# Patient Record
Sex: Male | Born: 1950
Health system: Southern US, Community
[De-identification: ages and names within clinical notes are randomized; demographics above are authoritative.]

## PROBLEM LIST (undated history)

## (undated) DIAGNOSIS — M1990 Unspecified osteoarthritis, unspecified site: Secondary | ICD-10-CM

## (undated) DIAGNOSIS — K219 Gastro-esophageal reflux disease without esophagitis: Secondary | ICD-10-CM

## (undated) DIAGNOSIS — I440 Atrioventricular block, first degree: Secondary | ICD-10-CM

## (undated) DIAGNOSIS — D649 Anemia, unspecified: Secondary | ICD-10-CM

## (undated) DIAGNOSIS — Z8614 Personal history of Methicillin resistant Staphylococcus aureus infection: Secondary | ICD-10-CM

## (undated) DIAGNOSIS — I2510 Atherosclerotic heart disease of native coronary artery without angina pectoris: Secondary | ICD-10-CM

## (undated) DIAGNOSIS — G4730 Sleep apnea, unspecified: Secondary | ICD-10-CM

## (undated) DIAGNOSIS — E785 Hyperlipidemia, unspecified: Secondary | ICD-10-CM

## (undated) DIAGNOSIS — R011 Cardiac murmur, unspecified: Secondary | ICD-10-CM

## (undated) DIAGNOSIS — E039 Hypothyroidism, unspecified: Secondary | ICD-10-CM

## (undated) DIAGNOSIS — I10 Essential (primary) hypertension: Secondary | ICD-10-CM

## (undated) DIAGNOSIS — I453 Trifascicular block: Secondary | ICD-10-CM

## (undated) DIAGNOSIS — I219 Acute myocardial infarction, unspecified: Secondary | ICD-10-CM

## (undated) DIAGNOSIS — M6740 Ganglion, unspecified site: Secondary | ICD-10-CM

## (undated) DIAGNOSIS — Z98811 Dental restoration status: Secondary | ICD-10-CM

## (undated) HISTORY — DX: Essential (primary) hypertension: I10

## (undated) HISTORY — PX: PROSTATECTOMY: SHX69

## (undated) HISTORY — PX: TRANSMETATARSAL AMPUTATION: SHX6197

## (undated) HISTORY — DX: Sleep apnea, unspecified: G47.30

## (undated) HISTORY — DX: Acute myocardial infarction, unspecified: I21.9

## (undated) HISTORY — PX: CORONARY ANGIOPLASTY WITH STENT PLACEMENT: SHX49

## (undated) HISTORY — DX: Cardiac murmur, unspecified: R01.1

## (undated) HISTORY — DX: Hyperlipidemia, unspecified: E78.5

## (undated) HISTORY — PX: CATARACT EXTRACTION: SUR2

---

## 1999-06-16 ENCOUNTER — Ambulatory Visit: Admission: RE | Admit: 1999-06-16 | Discharge: 1999-06-16 | Payer: Self-pay | Admitting: Otolaryngology

## 2001-07-08 ENCOUNTER — Encounter: Admission: RE | Admit: 2001-07-08 | Discharge: 2001-10-06 | Payer: Self-pay | Admitting: Internal Medicine

## 2001-09-30 ENCOUNTER — Ambulatory Visit (HOSPITAL_COMMUNITY): Admission: RE | Admit: 2001-09-30 | Discharge: 2001-09-30 | Payer: Self-pay | Admitting: Gastroenterology

## 2002-05-23 ENCOUNTER — Encounter: Payer: Self-pay | Admitting: Internal Medicine

## 2002-05-23 ENCOUNTER — Encounter: Admission: RE | Admit: 2002-05-23 | Discharge: 2002-05-23 | Payer: Self-pay | Admitting: Internal Medicine

## 2004-01-24 DIAGNOSIS — I2510 Atherosclerotic heart disease of native coronary artery without angina pectoris: Secondary | ICD-10-CM

## 2004-01-24 HISTORY — DX: Atherosclerotic heart disease of native coronary artery without angina pectoris: I25.10

## 2004-03-21 ENCOUNTER — Encounter (INDEPENDENT_AMBULATORY_CARE_PROVIDER_SITE_OTHER): Payer: Self-pay | Admitting: *Deleted

## 2004-03-21 ENCOUNTER — Ambulatory Visit (HOSPITAL_COMMUNITY): Admission: RE | Admit: 2004-03-21 | Discharge: 2004-03-21 | Payer: Self-pay | Admitting: Gastroenterology

## 2004-05-27 ENCOUNTER — Ambulatory Visit: Payer: Self-pay | Admitting: Internal Medicine

## 2004-06-23 DIAGNOSIS — I219 Acute myocardial infarction, unspecified: Secondary | ICD-10-CM

## 2004-06-23 HISTORY — DX: Acute myocardial infarction, unspecified: I21.9

## 2004-07-11 ENCOUNTER — Inpatient Hospital Stay (HOSPITAL_COMMUNITY): Admission: EM | Admit: 2004-07-11 | Discharge: 2004-07-13 | Payer: Self-pay | Admitting: Emergency Medicine

## 2004-08-03 ENCOUNTER — Ambulatory Visit (HOSPITAL_COMMUNITY): Admission: RE | Admit: 2004-08-03 | Discharge: 2004-08-04 | Payer: Self-pay | Admitting: Cardiology

## 2004-08-09 ENCOUNTER — Ambulatory Visit: Payer: Self-pay | Admitting: Cardiology

## 2004-08-25 ENCOUNTER — Encounter (HOSPITAL_COMMUNITY): Admission: RE | Admit: 2004-08-25 | Discharge: 2004-11-23 | Payer: Self-pay | Admitting: Cardiology

## 2004-10-13 ENCOUNTER — Ambulatory Visit: Payer: Self-pay | Admitting: Cardiology

## 2004-11-24 ENCOUNTER — Encounter (HOSPITAL_COMMUNITY): Admission: RE | Admit: 2004-11-24 | Discharge: 2005-01-20 | Payer: Self-pay | Admitting: Cardiology

## 2005-01-04 ENCOUNTER — Ambulatory Visit: Payer: Self-pay | Admitting: Cardiology

## 2005-04-05 ENCOUNTER — Ambulatory Visit: Payer: Self-pay | Admitting: Cardiology

## 2005-07-06 ENCOUNTER — Ambulatory Visit: Payer: Self-pay | Admitting: Cardiology

## 2006-02-19 ENCOUNTER — Inpatient Hospital Stay (HOSPITAL_COMMUNITY): Admission: EM | Admit: 2006-02-19 | Discharge: 2006-02-26 | Payer: Self-pay | Admitting: Emergency Medicine

## 2006-02-21 ENCOUNTER — Ambulatory Visit: Payer: Self-pay | Admitting: Infectious Diseases

## 2006-02-23 ENCOUNTER — Encounter (INDEPENDENT_AMBULATORY_CARE_PROVIDER_SITE_OTHER): Payer: Self-pay | Admitting: Specialist

## 2006-02-23 DIAGNOSIS — Z8614 Personal history of Methicillin resistant Staphylococcus aureus infection: Secondary | ICD-10-CM

## 2006-02-23 HISTORY — DX: Personal history of Methicillin resistant Staphylococcus aureus infection: Z86.14

## 2006-02-23 HISTORY — PX: TOE AMPUTATION: SHX809

## 2006-03-28 ENCOUNTER — Ambulatory Visit: Payer: Self-pay | Admitting: Internal Medicine

## 2006-04-08 ENCOUNTER — Encounter: Admission: RE | Admit: 2006-04-08 | Discharge: 2006-04-08 | Payer: Self-pay | Admitting: Orthopaedic Surgery

## 2007-01-07 ENCOUNTER — Telehealth: Payer: Self-pay | Admitting: Internal Medicine

## 2007-01-14 ENCOUNTER — Telehealth (INDEPENDENT_AMBULATORY_CARE_PROVIDER_SITE_OTHER): Payer: Self-pay | Admitting: *Deleted

## 2007-01-22 ENCOUNTER — Encounter: Payer: Self-pay | Admitting: Internal Medicine

## 2007-01-22 DIAGNOSIS — I252 Old myocardial infarction: Secondary | ICD-10-CM | POA: Insufficient documentation

## 2007-01-22 DIAGNOSIS — G4733 Obstructive sleep apnea (adult) (pediatric): Secondary | ICD-10-CM | POA: Insufficient documentation

## 2007-01-22 DIAGNOSIS — E782 Mixed hyperlipidemia: Secondary | ICD-10-CM | POA: Insufficient documentation

## 2007-01-22 DIAGNOSIS — I10 Essential (primary) hypertension: Secondary | ICD-10-CM | POA: Insufficient documentation

## 2007-01-22 DIAGNOSIS — E669 Obesity, unspecified: Secondary | ICD-10-CM | POA: Insufficient documentation

## 2007-01-22 DIAGNOSIS — K219 Gastro-esophageal reflux disease without esophagitis: Secondary | ICD-10-CM | POA: Insufficient documentation

## 2007-01-22 DIAGNOSIS — Z862 Personal history of diseases of the blood and blood-forming organs and certain disorders involving the immune mechanism: Secondary | ICD-10-CM | POA: Insufficient documentation

## 2007-01-22 DIAGNOSIS — E039 Hypothyroidism, unspecified: Secondary | ICD-10-CM | POA: Insufficient documentation

## 2007-01-22 DIAGNOSIS — E1159 Type 2 diabetes mellitus with other circulatory complications: Secondary | ICD-10-CM | POA: Insufficient documentation

## 2007-01-22 DIAGNOSIS — M1990 Unspecified osteoarthritis, unspecified site: Secondary | ICD-10-CM | POA: Insufficient documentation

## 2007-02-05 ENCOUNTER — Telehealth: Payer: Self-pay | Admitting: Internal Medicine

## 2007-03-12 ENCOUNTER — Telehealth (INDEPENDENT_AMBULATORY_CARE_PROVIDER_SITE_OTHER): Payer: Self-pay | Admitting: *Deleted

## 2007-03-14 ENCOUNTER — Telehealth (INDEPENDENT_AMBULATORY_CARE_PROVIDER_SITE_OTHER): Payer: Self-pay | Admitting: *Deleted

## 2007-04-08 ENCOUNTER — Telehealth (INDEPENDENT_AMBULATORY_CARE_PROVIDER_SITE_OTHER): Payer: Self-pay | Admitting: *Deleted

## 2007-05-06 ENCOUNTER — Telehealth (INDEPENDENT_AMBULATORY_CARE_PROVIDER_SITE_OTHER): Payer: Self-pay | Admitting: *Deleted

## 2007-06-10 ENCOUNTER — Telehealth (INDEPENDENT_AMBULATORY_CARE_PROVIDER_SITE_OTHER): Payer: Self-pay | Admitting: *Deleted

## 2007-07-09 ENCOUNTER — Telehealth (INDEPENDENT_AMBULATORY_CARE_PROVIDER_SITE_OTHER): Payer: Self-pay | Admitting: *Deleted

## 2007-08-12 ENCOUNTER — Telehealth: Payer: Self-pay | Admitting: Internal Medicine

## 2007-09-09 ENCOUNTER — Telehealth: Payer: Self-pay | Admitting: Internal Medicine

## 2007-10-07 ENCOUNTER — Telehealth (INDEPENDENT_AMBULATORY_CARE_PROVIDER_SITE_OTHER): Payer: Self-pay | Admitting: *Deleted

## 2007-11-05 ENCOUNTER — Telehealth (INDEPENDENT_AMBULATORY_CARE_PROVIDER_SITE_OTHER): Payer: Self-pay | Admitting: *Deleted

## 2007-12-10 ENCOUNTER — Telehealth: Payer: Self-pay | Admitting: Internal Medicine

## 2008-01-13 ENCOUNTER — Telehealth: Payer: Self-pay | Admitting: Internal Medicine

## 2008-02-10 ENCOUNTER — Telehealth: Payer: Self-pay | Admitting: Internal Medicine

## 2008-03-12 ENCOUNTER — Telehealth (INDEPENDENT_AMBULATORY_CARE_PROVIDER_SITE_OTHER): Payer: Self-pay | Admitting: *Deleted

## 2008-03-16 ENCOUNTER — Ambulatory Visit: Payer: Self-pay | Admitting: Internal Medicine

## 2008-03-16 DIAGNOSIS — E785 Hyperlipidemia, unspecified: Secondary | ICD-10-CM | POA: Insufficient documentation

## 2008-03-16 DIAGNOSIS — G4730 Sleep apnea, unspecified: Secondary | ICD-10-CM | POA: Insufficient documentation

## 2008-03-21 ENCOUNTER — Encounter: Admission: RE | Admit: 2008-03-21 | Discharge: 2008-03-21 | Payer: Self-pay | Admitting: Orthopaedic Surgery

## 2008-05-11 ENCOUNTER — Telehealth (INDEPENDENT_AMBULATORY_CARE_PROVIDER_SITE_OTHER): Payer: Self-pay | Admitting: *Deleted

## 2008-07-13 ENCOUNTER — Telehealth (INDEPENDENT_AMBULATORY_CARE_PROVIDER_SITE_OTHER): Payer: Self-pay | Admitting: *Deleted

## 2008-09-07 ENCOUNTER — Telehealth: Payer: Self-pay | Admitting: Internal Medicine

## 2008-11-09 ENCOUNTER — Telehealth (INDEPENDENT_AMBULATORY_CARE_PROVIDER_SITE_OTHER): Payer: Self-pay | Admitting: *Deleted

## 2008-11-23 ENCOUNTER — Encounter: Payer: Self-pay | Admitting: Internal Medicine

## 2008-11-23 ENCOUNTER — Telehealth (INDEPENDENT_AMBULATORY_CARE_PROVIDER_SITE_OTHER): Payer: Self-pay | Admitting: *Deleted

## 2009-01-11 ENCOUNTER — Telehealth: Payer: Self-pay | Admitting: Internal Medicine

## 2009-03-08 ENCOUNTER — Telehealth: Payer: Self-pay | Admitting: Internal Medicine

## 2009-05-03 ENCOUNTER — Telehealth (INDEPENDENT_AMBULATORY_CARE_PROVIDER_SITE_OTHER): Payer: Self-pay | Admitting: *Deleted

## 2009-07-05 ENCOUNTER — Telehealth (INDEPENDENT_AMBULATORY_CARE_PROVIDER_SITE_OTHER): Payer: Self-pay | Admitting: *Deleted

## 2009-07-06 ENCOUNTER — Telehealth (INDEPENDENT_AMBULATORY_CARE_PROVIDER_SITE_OTHER): Payer: Self-pay | Admitting: *Deleted

## 2009-07-27 ENCOUNTER — Ambulatory Visit: Payer: Self-pay | Admitting: Internal Medicine

## 2009-07-27 DIAGNOSIS — G4710 Hypersomnia, unspecified: Secondary | ICD-10-CM | POA: Insufficient documentation

## 2009-10-26 ENCOUNTER — Telehealth (INDEPENDENT_AMBULATORY_CARE_PROVIDER_SITE_OTHER): Payer: Self-pay | Admitting: *Deleted

## 2010-01-06 ENCOUNTER — Telehealth (INDEPENDENT_AMBULATORY_CARE_PROVIDER_SITE_OTHER): Payer: Self-pay | Admitting: *Deleted

## 2010-02-13 ENCOUNTER — Encounter: Payer: Self-pay | Admitting: Orthopaedic Surgery

## 2010-02-24 NOTE — Progress Notes (Signed)
Summary: ritalin  Phone Note Call from Patient   Caller: Patient Call For: YOUNG Summary of Call: pt wants rx for ritalin. he will pick up. [phone] Initial call taken by: Tivis Ringer, CNA,  January 06, 2010 11:44 AM  Follow-up for Phone Call        Pt is aware that RX is at front for pick up.[name] CMA  January 06, 2010 5:08 PM     Prescriptions: RITALIN SR 20 MG CR-TABS (METHYLPHENIDATE HCL) take 1-2 by mouth daily as directed  #60 x 0   Entered by:   Vernie Murders   Authorized by:   Waymon Budge MD   Signed by:   Vernie Murders on 01/06/2010   Method used:   Print then Give to Patient   RxID:   [id number]

## 2010-02-24 NOTE — Progress Notes (Signed)
Summary: prescript  Phone Note Call from Patient Call back at [phone]   Caller: Patient Call For: [name] Summary of Call: pharmacy states methylphenidate is on back order . is there somethingelse he can try Initial call taken by: Rickard Patience,  July 06, 2009 1:39 PM  Follow-up for Phone Call        Please advise substitute, thanks! [name]  July 06, 2009 1:43 PM   Additional Follow-up for Phone Call Additional follow up Details #1::        Per CDY-ok to give generic Adderall sme qty and directions. RX updated in med list and printed for CDY to sign.    RX has been signed. LMTCB and see if he would like to pick up or mail to home address.[name] CMA  July 06, 2009 3:12 PM     Additional Follow-up for Phone Call Additional follow up Details #2::    patient returned katie's call.  informed pt that CDY okay'd for him to have generic adderall and that a script is ready for him to pick up at his convenience.  pt verbalized his understanding.  rx left up front in the brown filer. Boone Master CNA/MA  July 06, 2009 3:26 PM   New/Updated Medications: AMPHETAMINE-DEXTROAMPHETAMINE 20 MG XR24H-CAP (AMPHETAMINE-DEXTROAMPHETAMINE) 1-2 daily as directed Prescriptions: AMPHETAMINE-DEXTROAMPHETAMINE 20 MG XR24H-CAP (AMPHETAMINE-DEXTROAMPHETAMINE) 1-2 daily as directed  #60 x 0   Entered by:   [name] CMA   Authorized by:   Waymon Budge MD   Signed by:   [name] CMA on 07/06/2009   Method used:   Print then Give to Patient   RxID:   [id number]

## 2010-02-24 NOTE — Progress Notes (Signed)
Summary: PRESCRIPT for Ritalin  Phone Note Call from Patient Call back at [phone]   Caller: Patient Call For: YOUNG Summary of Call: PT CALLING FOR RITALIN PRESCRIPT . HE WILL PICK UP WHEN READY. Initial call taken by: Rickard Patience,  October 26, 2009 9:22 AM  Follow-up for Phone Call        Pt last seen 7-11; was told to call for RX refills; Rx printed for CDY to sign and will call patient when RX is ready for pick up.[name] CMA  October 26, 2009 10:24 AM    Rx is at front for pick up .[name] CMA  October 27, 2009 8:48 AM   Additional Follow-up for Phone Call Additional follow up Details #1::        called and spoke with pt.  pt aware rx at front desk to pick up.  Aundra Millet Reynolds LPN  October 27, 2009 8:58 AM     Prescriptions: RITALIN SR 20 MG CR-TABS (METHYLPHENIDATE HCL) take 1-2 by mouth daily as directed  #60 x 0   Entered by:   [name] CMA   Authorized by:   Waymon Budge MD   Signed by:   [name] CMA on 10/26/2009   Method used:   Print then Give to Patient   RxID:   [id number]

## 2010-02-24 NOTE — Progress Notes (Signed)
Summary: rx  Phone Note Call from Patient Call back at [phone]   Caller: Patient Call For: Kaiya Boatman Summary of Call: need rx for ritalin - pt will pick up Initial call taken by: Eugene Gavia,  March 08, 2009 10:07 AM  Follow-up for Phone Call        rx printed and palced on CY look-at. Carron Curie CMA  March 08, 2009 10:25 AM   RX is signed and at front for pick up. Pt aware.[name] CMA  March 08, 2009 4:46 PM     Prescriptions: METHYLPHENIDATE HCL CR 20 MG CR-TABS (METHYLPHENIDATE HCL) 1-2 daily as directed  #60 x 0   Entered by:   Carron Curie CMA   Authorized by:   Waymon Budge MD   Signed by:   Carron Curie CMA on 03/08/2009   Method used:   Print then Give to Patient   RxID:   [id number]

## 2010-02-24 NOTE — Progress Notes (Signed)
Summary: rx  Phone Note Call from Patient Call back at [phone]   Caller: Patient Call For: yioung Reason for Call: Talk to Nurse Summary of Call: need rx for ritalin 20mg  SR - pt will pick up - call when ready. Initial call taken by: Eugene Gavia,  July 05, 2009 10:49 AM  Follow-up for Phone Call        after I printed RX I realized that pt was alst seen 03-16-08 so I called to advise that he needs to set an appt in order to get refills, the connection was bad and the call was dropped.  I called pt back and advised. Pt set to see CY on 07-27-09 at 2:30.  Pt advised needs to keep appt for future refills. Carron Curie CMA  July 05, 2009 11:07 AM     Additional Follow-up for Phone Call Additional follow up Details #1::        Pt aware that RX is ready for pick up and to keep July appt for additional refills.[name] CMA  July 05, 2009 11:42 AM     Prescriptions: METHYLPHENIDATE HCL CR 20 MG CR-TABS (METHYLPHENIDATE HCL) 1-2 daily as directed  #60 x 0   Entered by:   Carron Curie CMA   Authorized by:   Waymon Budge MD   Signed by:   Carron Curie CMA on 07/05/2009   Method used:   Print then Give to Patient   RxID:   [id number]

## 2010-02-24 NOTE — Progress Notes (Signed)
Summary: rx ritalin pick up   Phone Note Call from Patient   Caller: Patient Call For: young Summary of Call: pt requests to pick up rx for ritalin. [phone] Initial call taken by: Tivis Ringer, CNA,  May 03, 2009 11:10 AM  Follow-up for Phone Call        pt last requested rx for Ritalin on 03-08-2009 for # 60 x 0 refills.  Please advise.  Thanks.  Aundra Millet Reynolds LPN  May 03, 2009 11:28 AM   Additional Follow-up for Phone Call Additional follow up Details #1::        Done- he can pick up at front Additional Follow-up by: Waymon Budge MD,  May 03, 2009 1:38 PM    Additional Follow-up for Phone Call Additional follow up Details #2::    Pt aware rx at front to pick up.  Gweneth Dimitri RN  May 03, 2009 2:19 PM   Prescriptions: METHYLPHENIDATE HCL CR 20 MG CR-TABS (METHYLPHENIDATE HCL) 1-2 daily as directed  #60 x 0   Entered by:   [name] CMA   Authorized by:   Waymon Budge MD   Signed by:   [name] CMA on 05/03/2009   Method used:   Reprint   RxID:   [id number] METHYLPHENIDATE HCL CR 20 MG CR-TABS (METHYLPHENIDATE HCL) 1-2 daily as directed  #60 x 0   Entered by:   Waymon Budge MD   Authorized by:   Pulmonary Triage   Signed by:   Waymon Budge MD on 05/03/2009   Method used:   Print then Give to Patient   RxID:   [id number]

## 2010-02-24 NOTE — Assessment & Plan Note (Signed)
Summary: 1 year follow-up//jrc   Primary Provider/Referring Provider:  Kirby Funk  CC:  follow up visit-sleep apnea.  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 He skips it once in a while on out of town trips and clearly notices the difference.  He is continued using Ritalin 20 mg SR once most mornings and we discussed long term use.  It does seem to help him function with normal alertness.  There have been no special problems.  03/16/08- OSA with hypersomnia May take otc analgesic/PM for aches and pains at night. May occasionally take a Sonata but not often. Still takes methylphenidate once on most days- well tolerated without questions, changes or concerns. Denies new cardiac events.  July 27, 2009- OSA w/ hypersonmnia We switched from Ritalin to Adderall when Ritalin was in short supply. Ritalin works the same but cheaper. Denies adverse effects, chest pain, palpitation. 1-2 tabs daily remains sufficient. He sleeps ok, but admits he may stay up too late- 1030-1130 and up 530-6AM. Sleep apnea is well controlled on CPAP 10 used all night every night. Christoper Allegra replaces mask and hose if needed. Sonata helps.    Preventive Screening-Counseling & Management  Alcohol-Tobacco     Smoking Status: never  Current Medications (verified): 1)  Sonata 10 Mg Caps (Zaleplon) .... Take 1 Tab By Mouth At Bedtime As Needed 2)  Amphetamine-Dextroamphetamine 20 Mg Xr24h-Cap (Amphetamine-Dextroamphetamine) .Marland Kitchen.. 1-2 Daily As Directed 3)  Lipitor 40 Mg  Tabs (Atorvastatin Calcium) .... Once Daily 4)  Plavix 75 Mg  Tabs (Clopidogrel Bisulfate) .... Take 1 Tablet By Mouth Once A Day 5)  Actos 30 Mg Tabs (Pioglitazone Hcl) .... Take 1 Tablet By Mouth Once A Day 6)  Bayer Aspirin 325 Mg Tabs (Aspirin)  .... Take 1 Tablet By Mouth Once A Day 7)  Glimepiride 1 Mg Tabs (Glimepiride) .... Take 1 Tablet By Mouth Once A Day 8)  Lisinopril 20 Mg Tabs (Lisinopril) .... Take 1 By Mouth Once Daily 9)  Synthroid 200 Mcg Tabs (Levothyroxine Sodium) .... Take 1 Tablet By Mouth Once A Day 10)  Cpap 10 [employer] 11)  Ritalin Sr 20 Mg Cr-Tabs (Methylphenidate Hcl) .... Take 1-2 By Mouth Daily As Directed 12)  Joint Health 750-375-30 Mg Tabs (Glucosamine-Msm-Hyaluronic Acd) .... Take 3 By Mouth Once Daily 13)  Omega-3 1000 Mg Caps (Omega-3 Fatty Acids) .... Take 3  By Mouth Once Daily 14)  Osteo Matrix  Caps (Multiple Vitamins-Minerals) .... Take 4 By Mouth Once Daily 15)  Vitalizer Gold .Marland Kitchen.. 1 Strip Once Daily  Allergies (verified): 1)  ! 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  CPA- taxes  Risk Factors: Smoking Status: never (07/27/2009)  Review of Systems      See HPI  The patient denies shortness of breath with activity, shortness of breath at rest, productive cough, non-productive cough, coughing up blood, chest pain, irregular heartbeats, acid heartburn, indigestion, loss of appetite, weight change, abdominal pain, difficulty swallowing, sore throat, tooth/dental problems, headaches, nasal congestion/difficulty breathing through nose,  and sneezing.    Vital Signs:  Patient profile:   60 year old male Weight:      349 pounds O2 Sat:      95 % on Room air Pulse rate:   87 / minute BP sitting:   132 / 78  (right arm) Cuff size:   large  Vitals Entered By: [name] CMA (July 27, 2009 2:44 PM)  O2 Flow:  Room air CC: follow up visit-sleep  apnea   Physical Exam  Additional Exam:  General: A/Ox3; pleasant and cooperative, NAD, very tall, overweight, relaxed and alert SKIN: no rash, lesions NODES: no lymphadenopathy HEENT: Bowers/AT, EOM- WNL, Conjuctivae- clear, PERRLA, TM-WNL, Nose- clear, Throat- clear and wnl, Mallampati III NECK: Supple w/ fair ROM, JVD- none, normal carotid impulses w/o bruits Thyroid- normal to palpation, short neck CHEST: Clear to P&A HEART: RRR, no m/g/r heard ABDOMEN: Soft and nl; RJJ:OACZ, nl pulses, no edema  NEURO: Grossly intact to observation      Impression & Recommendations:  Problem # 1:  SLEEP APNEA (ICD-780.57) Good compliance and control on CPAP, weight loss is encouraged. Residual hypersomnia. We again discussed sleep hygiene, needing to ensure he is getting enough sleep. We will refill Sonata and Ritalin. He may need to call to change to Adderal again if supply remains a problem. Thyroid is managed and ok.  Problem # 2:  HYPERSOMNIA (ICD-780.54) As above. Sleep pattern is partly affected by his work schedule, which is seasonal as an Airline pilot.  Medications Added to Medication List This Visit: 1)  Lisinopril 20 Mg Tabs (Lisinopril) .... Take 1 by mouth once daily 2)  Ritalin Sr 20 Mg Cr-tabs (Methylphenidate hcl) .... Take 1-2 by mouth daily as directed 3)  Joint Health 750-375-30 Mg Tabs (Glucosamine-msm-hyaluronic acd) .... Take 3 by mouth once daily 4)  Omega-3 1000 Mg Caps (Omega-3 fatty acids) .... Take 3  by mouth once daily 5)  Osteo Matrix Caps (multiple Vitamins-minerals)  .... Take 4 by mouth once daily 6)  Vitalizer Gold  .Marland Kitchen.. 1 strip once daily  Other Orders: Est. Patient Level III (66063)  Patient Instructions: 1)  Please schedule a follow-up appointment in 1 year. 2)  Scripts for [employer] and ritalin refilled. Call if needed. Prescriptions: RITALIN SR 20 MG CR-TABS (METHYLPHENIDATE HCL) take 1-2 by mouth daily as directed  #60 x 0   Entered and Authorized by:    Waymon Budge MD   Signed by:   Waymon Budge MD on 07/27/2009   Method used:   Print then Give to Patient   RxID:   [id number] SONATA 10 MG CAPS (ZALEPLON) Take 1 tab by mouth at bedtime as needed  #30 x prn   Entered and Authorized by:   Waymon Budge MD   Signed by:   Waymon Budge MD on 07/27/2009   Method used:   Print then Give to Patient   RxID:   [id number]

## 2010-03-03 ENCOUNTER — Telehealth: Payer: Self-pay | Admitting: Internal Medicine

## 2010-03-10 NOTE — Progress Notes (Signed)
Summary: ritalin rx  Phone Note Call from Patient   Caller: Patient Call For: young Summary of Call: pt wants to pick up rx for ritalin. pt # [id number] Initial call taken by: Tivis Ringer, CNA,  March 03, 2010 9:47 AM  Follow-up for Phone Call        Rx printed and placed on CY cart to sign. Zackery Barefoot CMA  March 03, 2010 10:06 AM   Additional Follow-up for Phone Call Additional follow up Details #1::        Rx at front for pick up.[name] CMA  March 03, 2010 10:25 AM     Additional Follow-up for Phone Call Additional follow up Details #2::    Pt informed Rx ready for pick up. Zackery Barefoot CMA  March 03, 2010 10:29 AM   Prescriptions: RITALIN SR 20 MG CR-TABS (METHYLPHENIDATE HCL) take 1-2 by mouth daily as directed  #60 x 0   Entered by:   Zackery Barefoot CMA   Authorized by:   Waymon Budge MD   Signed by:   Zackery Barefoot CMA on 03/03/2010   Method used:   Print then Give to Patient   RxID:   [id number]

## 2010-03-30 ENCOUNTER — Telehealth (INDEPENDENT_AMBULATORY_CARE_PROVIDER_SITE_OTHER): Payer: Self-pay | Admitting: *Deleted

## 2010-04-05 NOTE — Progress Notes (Signed)
Summary: sleep apnea questions  Phone Note Call from Patient Call back at [phone]   Caller: Patient Call For: young Summary of Call: Pt wants to know if he has obstructive or central sleep apnea pls advise. Initial call taken by: Darletta Moll,  March 30, 2010 3:49 PM  Follow-up for Phone Call        OSA is his dx.  Spoke with pt and notified of this and he verbalized understanding.  Follow-up by: Vernie Murders,  March 30, 2010 4:10 PM

## 2010-05-02 ENCOUNTER — Telehealth: Payer: Self-pay | Admitting: Internal Medicine

## 2010-05-02 MED ORDER — METHYLPHENIDATE HCL 20 MG PO TBCR: EXTENDED_RELEASE_TABLET | ORAL | Status: DC

## 2010-05-02 NOTE — Telephone Encounter (Signed)
Left message at given number that RX is at front for pick up and call the office if he has any questions or concerns.Vivianne Spence

## 2010-05-02 NOTE — Telephone Encounter (Signed)
Printed out rx for Dr. Maple Hudson to sign. Rx was placed on cdy cart. Please advise Thanks

## 2010-06-10 NOTE — Op Note (Signed)
[name], [name]                   ACCOUNT NO.:  [id number]   MEDICAL RECORD NO.:  [id number]          PATIENT TYPE:  AMB   LOCATION:  ENDO                         FACILITY:  [facility]   PHYSICIAN:  Danise Edge, M.D.   DATE OF BIRTH:  03/21/1950   DATE OF PROCEDURE:  03/21/2004  DATE OF DISCHARGE:                                 OPERATIVE REPORT   PROCEDURE:  Esophagogastroduodenoscopy and colonoscopy.   INDICATIONS:  Mr. Lakyn Mantione is a 60 year old male born [birth year].  Mr.  Sawa has unexplained iron deficiency anemia.   ENDOSCOPIST:  Danise Edge, M.D.   PREMEDICATION:  Versed 7.5 milligrams, Demerol 50 milligrams.   PROCEDURE:  ESOPHAGOGASTRODUODENOSCOPY WITH SMALL BOWEL BIOPSY:  After  obtaining informed consent, Mr. Soderlund was placed in the left lateral  decubitus position. I administered intravenous Demerol and intravenous  Versed to achieve conscious sedation for the procedure. The patient's blood  pressure, oxygen saturation and cardiac rhythm were monitored throughout the  procedure and documented in the medical record.   The Olympus gastroscope was passed through the posterior  hypopharynx into  the proximal esophagus without difficulty. I did not visualize the vocal  cords.   ESOPHAGOSCOPY:  The proximal, mid and lower segments of the esophageal  mucosa appeared normal.   GASTROSCOPY:  Retroflex view of the gastric cardia and fundus was normal.  The gastric body, antrum and pylorus appeared normal.   DUODENOSCOPY:  The duodenal bulb, second portion and third portion of  duodenum appear normal.   BIOPSIES:  Five biopsies were taken from the second - third portions of the  duodenum to look for celiac sprue.   ASSESSMENT:  Normal esophagogastroduodenoscopy.  Small bowel biopsies rule  out celiac sprue pending.   PROCEDURE:  PROCTOCOLONOSCOPY TO THE CECUM:  Anal inspection and digital  rectal exam were normal. The prostate was nonnodular. The Olympus adjustable  pediatric colonoscope was introduced into the rectum and advanced to the  cecum. Colonic preparation for the exam today was satisfactory.   RECTUM:  Normal.  SIGMOID COLON AND DESCENDING COLON:  Normal.  SPLENIC FLEXURE:  Normal.  TRANSVERSE COLON:  Normal.  HEPATIC FLEXURE:  Normal.  ASCENDING COLON:  Normal.  CECUM AND ILEOCECAL VALVE:  Normal.   ASSESSMENT:  Normal proctocolonoscopy to the cecum.      MJ/MEDQ  D:  03/21/2004  T:  03/21/2004  Job:  [id number]   cc:   Thora Lance, M.D.  [address]  [place]  [place] [address]  Fax: [phone]

## 2010-06-10 NOTE — Discharge Summary (Signed)
[name], [name]                   ACCOUNT NO.:  [id number]   MEDICAL RECORD NO.:  [id number]          PATIENT TYPE:  INP   LOCATION:  6523                         FACILITY:  [facility]   PHYSICIAN:  Thora Lance, M.D.  DATE OF BIRTH:  [birth year]   DATE OF ADMISSION:  07/11/2004  DATE OF DISCHARGE:  07/13/2004                                 DISCHARGE SUMMARY   REASON FOR ADMISSION:  A 60 year old white male with history of diabetes and  hypertension presented with several episodes of chest pressure and an  episode of sweating on the day of admission.  He was admitted to rule out an  MI or unstable angina.   SIGNIFICANT FINDINGS AT ADMISSION:  VITAL SIGNS:  Blood pressure 130/80;  heart rate 76; temperature 98.8.  LUNGS:  Clear.  HEART:  Regular rate and rhythm, without murmur, gallop, or rub.  EXTREMITIES:  Showed no edema.   EKG:  Normal sinus rhythm, right bundle branch block, left axis deviation.   ADMISSION LABORATORIES:  CBC:  Hemoglobin 12.6, platelet count 239, WBC 6.7.  Chemistries:  Sodium 136, potassium 4, chloride 105, bicarbonate 25, glucose  131, BUN 11, creatinine 1.1, calcium 9.2.  CK 138, CK-MB 5.8, troponin I  0.08.  Chest x-ray unremarkable.   HOSPITAL COURSE:  The patient was admitted for probable acute coronary  syndrome.  His cardiac enzymes, including CPK and troponin I, did end up  being positive for an MI.  He was placed on Lovenox, aspirin, and  nitroglycerin IV.  He was seen by Dr. Amil Amen of the cardiology service.  On  July 12, 2004, he underwent a cardiac catheterization and had angioplasty  and stenting done of a proximal LAD 75% lesion and a 100% ramus intermedius  lesion.  There was also a distal 90% RCA lesion.  The patient tolerated  these procedures well, without any complications.  He was discharged in good  condition, to be followed up by cardiology.  He is diabetic, and the CBGs  and sliding scale were under good control.  He was started on  Lipitor prior  to discharge.  He is hypothyroid, and his TSH was slightly decreased, and  his Synthroid dose was dropped slightly before discharge.   DISCHARGE DIAGNOSES:  1.  Non-Q wave myocardial infarction.  2.  Diabetes mellitus.  3.  Hypertension.  4.  Dyslipidemia.  5.  Gastroesophageal reflux disease.  6.  Hypothyroidism.  7.  Iron deficiency anemia.  8.  Obesity.  9.  Obstructive sleep apnea.  10. Degenerative joint disease of the right ankle.  11. Allergic rhinitis.   PROCEDURES:  1.  Cardiac catheterization.  2.  Angioplasty and stenting of two coronary arteries.   DISCHARGE MEDICATIONS:  1.  Triton study drug.  2.  Lipitor 10 mg p.o. daily.  3.  Synthroid 175 mcg daily.  4.  Lisinopril 20 mg daily.  5.  Metformin 1,000 mg b.i.d. starting back on July 15, 2004.  6.  Actos 30 mg daily.  7.  Ritalin SR 20 mg daily.  8.  AcipHex  20 mg daily.  9.  Aspirin 325 mg daily.  10. Iron sulfate 325 mg one b.i.d.  11. Nitroglycerin 0.4 mg sublingual p.r.n. chest pain.   FOLLOWUP:  1.  Dr. Amil Amen, July 29, 2004 at 2:00 p.m.  2.  Dr. Valentina Lucks, six weeks, fasting.   ACTIVITY:  Increase activity slowly.   DIET:  Low salt, diabetic diet.   RETURN TO WORK:  Out of work until he sees Dr. Amil Amen.   The patient was enrolled in the Triton research trial prior to discharge.   DISPOSITION:  Discharged to home.       JJG/MEDQ  D:  07/15/2004  T:  07/15/2004  Job:  [id number]   cc:   Francisca December, M.D.  [address]  [place]  [place] [address]  Fax: [phone]

## 2010-06-10 NOTE — Assessment & Plan Note (Signed)
[facility]                             PULMONARY OFFICE NOTE   NAME:[name] [name] RAZZANO                          MRN:          [id number]  DATE:03/28/2006                            DOB:          10-Feb-1950    CARDIOLOGIST:  Dr. Francisca [name].   PROBLEMS:  1. Obstructive sleep apnea.  2. Diabetes.   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  He skips it once in a while on out of town trips and clearly notices the  difference.  He is continued using Ritalin 20 mg SR once most mornings  and we discussed long term use.  It does seem to help him function with  normal alertness.  There have been no special problems.   MEDICATIONS:  1. CPAP 10 CWP.  2. Lipitor 40 mg.  3. Ritalin 20 mg SR.  4. Plavix.  5. Sonata 10 mg rarely used for bedtime.  6. Lisinopril 10 mg.  7. Synthroid 0.175 mg.  8. Glucophage 1000 mg b.i.d.  9. Aspirin 325 mg.  10.Actos 30 mg.   OBJECTIVE:  Weight 335 pounds, this a tall man but he is still too  heavy.  Blood pressure 130/78, pulse regular at 73, room air saturation  96%.  CHEST:  Clear.  HEART:  Sounds regular without murmur.  He is alert and appears comfortable, smiling easily.  No tremor or  restlessness.  Left foot is in a walking boot.   IMPRESSION:  Obstructive sleep apnea with satisfactory control on  continuous positive airway pressure at 10 centimeters of water pressure  and with supplement Ritalin 20 mg SR for residual morning sleepiness  that does not seem to be related to his degree of continuous positive  airway pressure control.   PLAN:  1. Continue CPAP at 10 CWP.  2. May try off of Ritalin occasionally, but for now it is refilled.  3. Schedule return 1 year, earlier p.r.n.     [name] D. Maple Hudson, MD, [name] [name], FACP  Electronically  Signed    CDY/MedQ  DD: 03/28/2006  DT: 03/28/2006  Job #: [id number]   cc:   [name] [name], M.D.  [name] [name], M.D.

## 2010-06-10 NOTE — Op Note (Signed)
   [name], [name]                            ACCOUNT NO.:  [id number]   MEDICAL RECORD NO.:  [id number]                   PATIENT TYPE:  AMB   LOCATION:  ENDO                                 FACILITY:  [facility]   PHYSICIAN:  Charolett Bumpers, M.D.             DATE OF BIRTH:  10/13/1950   DATE OF PROCEDURE:  09/30/2001  DATE OF DISCHARGE:                                 OPERATIVE REPORT   PROCEDURE:  Screening colonoscopy.   PROCEDURE INDICATION:  The patient is a 60 year old male, born Jan 29, 1950.  The patient is scheduled to undergo his first screening colonoscopy with  polypectomy to prevent colon cancer.  The patient's father has undergone  colonoscopic exams to remove neoplastic polyps.  There is no family history  of colon cancer.   ENDOSCOPIST:  Charolett Bumpers, M.D.   PREMEDICATION:  Versed 10 mg, Demerol 50 mg.   ENDOSCOPE:  Olympus pediatric colonoscope.   DESCRIPTION OF PROCEDURE:  After obtaining informed consent, the patient was  placed in the left lateral decubitus position.  I administered intravenous  Demerol and intravenous Versed to achieve conscious sedation for the  procedure.  The patient's blood pressure, oxygen saturation, and cardiac  rhythm were monitored throughout the procedure and documented in the medical  record.   Anal inspection was normal.  Digital rectal exam revealed a nonnodular  prostate.  The Olympus pediatric video colonoscope was introduced into the  rectum and easily advanced to the cecum.  Colonic preparation for the exam  today was excellent.   RECTUM:  Normal.  SIGMOID COLON AND DESCENDING COLON:  Normal.  SPLENIC FLEXURE:  Normal.  TRANSVERSE COLON:  Normal.  HEPATIC FLEXURE:  Normal.  ASCENDING COLON:  Normal.  CECUM AND ILEOCECAL VALVE:  Normal.   ASSESSMENT:  1. Normal screening proctocolonoscopy to the cecum.  2. No endoscopic evidence for the presence of colorectal neoplasia.              Charolett Bumpers, M.D.   MKJ/MEDQ  D:  09/30/2001  T:  09/30/2001  Job:  [id number]   cc:   Thora Lance, M.D.

## 2010-06-10 NOTE — Op Note (Signed)
NAMEMORTY, ORTWEIN                   ACCOUNT NO.:  [id number]   MEDICAL RECORD NO.:  [id number]          PATIENT TYPE:  INP   LOCATION:  5015                         FACILITY:  [facility]   PHYSICIAN:  [name], M.D.DATE OF BIRTH:  08/29/1950   DATE OF PROCEDURE:  02/23/2006  DATE OF DISCHARGE:                               OPERATIVE REPORT   PREOPERATIVE DIAGNOSIS:  Left foot presumptive osteomyelitis second ray.   PREOPERATIVE DIAGNOSIS:  Left foot presumptive osteomyelitis second ray.   PROCEDURE:  Left foot second ray amputation.   SURGEON:  [name], M.D.   ANESTHESIA:  1. Regional left ankle block.  2. IV sedation.   FINDINGS:  Necrotic metatarsal head second ray left foot.   CULTURES:  Pending.   BLOOD LOSS:  Less than 100 mL.   COMPLICATIONS:  None.   INDICATIONS:  Briefly [name] is a pleasant 60 year old with a long  term history of diabetes and peripheral neuropathy.  He developed a  wound in his second metatarsal head.  This became a draining wound.  An  MRI then showed significant edema all the way around the second ray at  the metatarsal phalangeal joint.  Due to the deep nature of this  infection; and his peripheral vascular disease, it was recommended that  he undergo second ray resection.  Vascular studies have apparently been  done; and showed that he had good blood flow, and the potential for  healing this.  The risks and benefits of surgery were explained to him;  and he well understood, and agreed to proceed with surgery.   PROCEDURE DESCRIPTION:  After the left ankle and leg were marked, an  ankle block was obtained by anesthesia; then he was brought to the  operating room, and placed supine on the operative table.  IV sedation  was obtained.  His ankle and foot were prepped and draped with DuraPrep  and sterile drapes.  An Esmarch was used to wrap up the foot; and then I  used an Esmarch around the ankle as a local  tourniquet.   I made an incision directly over the second ray, in-line with the second  metatarsal.  I carried this around the second toe and into the plantar  surface of the foot where I could excise the sinus tract, and the ulcer  on the bottom.  The toe was first removed; and then I stripped back the  periosteum of the second metatarsal to healthy-appearing bone.  I used a  sagittal saw to then resect the second metatarsal.  The toe and second  metatarsal were all passed off the table for microbiology examination  for assessment for cultures and osteomyelitis.  On inspection of the  second metatarsal phalangeal joint in the head, this did appear  chronically infected.  There was edema in the soft tissues as well.  I  cut back the extensor and flexor tendons; and then used a rongeur to  clean the necrotic tissue from the wound.  Of note, there was abundant  bleeding that was encountered.  I did  not obtain a great deal of  hemostasis because I wanted to see if the bleeding would help with the  healing process of his foot.   I then copiously irrigated the tissues with bacitracin lay solution,  followed by normal saline solution.  The skin was then reapproximated in  line with #2 nylon suture.  Xeroform followed by well-padded sterile  dressing and Coban were applied.  The remainder of the toes remained  pink once I removed the Esmarch; and the patient was taken to recovery  room in stable condition.  There were no complications noted; and final  counts were correct.           ______________________________  [name], M.D.     CYB/MEDQ  D:  02/23/2006  T:  02/23/2006  Job:  [id number]

## 2010-06-10 NOTE — Cardiovascular Report (Signed)
[name], [name]                   ACCOUNT NO.:  [id number]   MEDICAL RECORD NO.:  [id number]          PATIENT TYPE:  OIB   LOCATION:  6531                         FACILITY:  [facility]   PHYSICIAN:  Francisca December, M.D.  DATE OF BIRTH:  16-Jun-1950   DATE OF PROCEDURE:  08/03/2004  DATE OF DISCHARGE:  08/04/2004                              CARDIAC CATHETERIZATION   PROCEDURES PERFORMED:  1.  PCI/drug-eluting stent implantation distal right coronary.  2.  Percutaneous closure right femoral artery.   INDICATIONS:  Mr. [name] is a 60 year old man who initially presented  July 11, 2004 with prolonged chest discomfort and subsequently ruled in for  non-ST-segment elevation myocardial infarction. Cardiac catheterization was  performed revealing significant obstruction of the proximal LAD and ramus  intermedius. This was treated with drug-eluting stents on July 12, 2004. He  was also found to have a distal septal RCA stenosis. He returns at this time  to complete his revascularization.   PROCEDURE NOTE:  The patient is brought to the cardiac catheterization  laboratory in the fasting state. The right groin was prepped and draped in  the usual sterile fashion. Local anesthesia was obtained with infiltration  of 1% lidocaine. A 6-French catheter sheath was inserted into the right  coronary artery. Diagnostic FR-4 catheter was advanced the descending aorta  and right coronary angiography was performed following the intracoronary  administration of 100 micrograms of nitroglycerin. This was done in the LAO  projection only. This catheter was removed. The patient received a 0.75  mg/kg bolus of bivalirudin followed by constant infusion. The resultant ACT  was 343 seconds. A 6-French number one AL guiding catheter was then advanced  to the descending aorta where the right coronary os was engaged. A 0.014  inch Scimed Luge intracoronary guidewire was passed across the lesion and  distal right  coronary without difficulty. The lesion was then primarily  stented using a 2.75/12 mm Scimed Taxus drug-eluting intracoronary stent.  This was advanced carefully into place and deployed at a peak pressure of 14  atmospheres for 130 seconds over the lesion of the distal RCA. This was a  rather focal obstruction. There was diffuse disease in the proximal and  midportion of the right coronary. This stent delivery balloon was deflated  removed and a 3.0/8 mm Scimed Quantum Maverick intracoronary balloon was  advanced into place carefully positioned within the stented segment and  inflated to a peak pressure of 16 atmospheres for 75 seconds. This balloon  was deflated and removed and following confirmation of adequate patency in  orthogonal views both with and without the guidewire in place the guiding  catheter was removed. A right femoral arteriogram in the 45 degree RAO  angulation via the catheter sheath by hand injection was performed and  demonstrated adequate anatomy for placement of percutaneous closure device  AngioSeal. This was successfully deployed with good hemostasis and intact  distal pulse. The patient was transported to recovery area in stable  condition.   ANGIOGRAPHY:  As mentioned, the lesion treated was in the distal portion  right coronary. It was rather focal and concentric. It was a 90% obstructive  lesion. There was diffuse disease in the proximal and midportions of the  right coronary which fortunately did nonobstructive the point of the stent.  Following balloon dilatation and stent implantation in the distal right  coronary, there was no residual stenosis.   FINAL IMPRESSION:  1.  Atherosclerotic cardiovascular disease, three-vessel.  2.  Status post successful percutaneous coronary intervention/drug-eluting      stent implantation distal right coronary.  3.  Typical angina was not reproduced with device insertion or balloon      inflation.       JHE/MEDQ   D:  08/23/2004  T:  08/24/2004  Job:  [id number]

## 2010-06-10 NOTE — Consult Note (Signed)
NAMERACE, LATOUR                   ACCOUNT NO.:  [id number]   MEDICAL RECORD NO.:  [id number]          PATIENT TYPE:  INP   LOCATION:  1831                         FACILITY:  [facility]   PHYSICIAN:  Francisca December, M.D.  DATE OF BIRTH:  03/07/1950   DATE OF CONSULTATION:  DATE OF DISCHARGE:                                   CONSULTATION   DATE OF CARDIOLOGY CONSULTATION:  July 11, 2004.   REASON FOR CONSULTATION:  Chest tightness.   HISTORY OF PRESENT ILLNESS:  Mr. [name] is a pleasant 60 year old male  without prior cardiac history, who presented to Dr. Jone Baseman office today  with complaints of chest pressure, which was anterior and substernal.  He  first noticed a spell of this two days ago, which was spontaneous and  resolved after several minutes.  Later in the day while driving from the  pharmacy, he began to feel clammy and washed out.  He arrived home and  checked his blood sugar (he is diabetic) and it was fine at 133.  Subsequently, he felt relatively well yesterday, but this morning awoke  again with discomfort in the chest and also a right-sided headache.  For  most of the morning, he has had this discomfort in his chest on and off,  each episode lasting about 10 to 15 minutes.  There is no particular  pattern.  He felt clammy and sweaty while out shopping today, also  associated with this chest pressure.  He has not had any shortness of  breath.  There is no radiation of the discomfort.  He has a mild amount of  this feeling in his chest at the time of my evaluation at 1630 in the  afternoon.   CARDIAC RISK FACTORS:  Cardiac risk factors are age, sex, treated  hypertension, diabetes, obesity, and obstructive sleep apnea currently  treated on CPAP.   PAST MEDICAL HISTORY:  1.  Diabetes mellitus, type 2, with end-organ damage/peripheral neuropathy      and history of foot ulcer.  2.  Hypertension.  3.  GERD with symptoms not similar to above.  4.  Iron deficiency  anemia, history of.  5.  Hypothyroidism.  6.  Possible nephrolithiasis.  7.  Obstructive sleep apnea on CPAP and Ritalin.  8.  DJD, right ankle.  9.  Allergic rhinitis/recurrent sinusitis.   PAST SURGICAL HISTORY:  1.  Right knee arthroscopy.  2.  Lasik surgery.  3.  Eyelid surgery.  4.  Bone spur removal, left great toe.   SOCIAL HISTORY:  The gentleman is married and is accompanied by his wife  here in the emergency room.  He has one son, age 63.  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  He is a non-smoker and uses only rare  alcohol.   FAMILY HISTORY:  His father is age 29 and has colon polyps.  Mother is age  38 and had apparent cardiac disease in her 62s, details are unclear.  He has  one sister in good health.   CURRENT MEDICATIONS:  1.  Levothyroxine 0.2 mg one p.o. daily.  2.  Lisinopril 20 mg p.o. daily.  3.  Metformin 1000 mg p.o. b.i.d.  4.  Actos 30 mg p.o. daily.  5.  Ritalin-SR 20 mg p.o. daily.  6.  AcipHex 20 mg p.o. daily.  7.  Aleve two p.o. q.a.m.  8.  Aspirin 81 mg p.o. daily.  9.  Iron sulfate 325 mg p.o. b.i.d.   DRUG ALLERGIES:  PENICILLIN causes a rash.   REVIEW OF SYSTEMS:  Negative except as mentioned above.   PHYSICAL EXAMINATION:  VITAL SIGNS:  The blood pressure is 130/80.  Pulse is  70 and regular.  Respiratory rate 16.  Temperature afebrile.  GENERAL:  This is an anxious, somewhat pale-appearing, Caucasian man in no  distress.  HEENT:  Unremarkable.  The head is atraumatic and normocephalic.  The pupils  are equal, round, and reactive to light and accommodation.  Extraocular  movements are intact.  Sclerae are anicteric.  Oral mucosa is pink and  moist.  Teeth and gums in good repair.  NECK:  Supple without thyromegaly or masses.  The carotid upstrokes are  normal.  There  is no bruit.  There is no jugulovenous distention.  CHEST:  Clear with adequate excursion.  Normal vesicular breath sounds are  heard throughout.   The precordium is quiet.  Normal S1 and S2 is heard.  No  S3, S4, click, or rub noted.  There is a very soft ejection systolic murmur  at the base.  ABDOMEN:  Flat, soft, and nontender without hepatosplenomegaly or midline  pulse, mass, no abdominal bruit.  Bowel sounds are present in all quadrants.  GU:  External genitalia is normal phallus, descended testicles, no lesion.  RECTAL:  Not performed.  EXTREMITIES:  Full range of motion, no edema, intact distal pulses.  NEUROLOGIC:  Cranial nerves II-XII are intact.  Motor and sensory are  grossly intact.  Gait not tested.  SKIN:  Warm, dry, and clear.   ACCESSORY CLINICAL DATA:  Electrocardiogram from Dr. Jone Baseman office shows  right bundle branch block, left axis deviation, bifascicular block,  isoelectric STs in 4, 5, and 6.  Initial ECG in the emergency room again  shows right bundle branch block, left axis.  There is a slight increased ST  segment depression in V2 and V3.  There may be a V2-V3 lead switch, though,  on Dr. Jone Baseman tracing.  Other laboratory evaluation and chest x-ray not  available at this time.   IMPRESSION:  1.  Unstable angina/acute coronary syndrome until proven otherwise.  2.  Multiple risk factors for coronary artery disease including age, sex,      diabetes, hypertension, obesity, and obstructive sleep apnea.  3.  Gastroesophageal reflux disease without symptoms similar to this.  4.  Hypothyroidism, treated.  5.  Degenerative joint disease.  6.  Allergic rhinitis.  7.  Iron deficiency anemia.   PLAN/RECOMMENDATION:  1.  Per your excellent care plan, I agree with aspirin, Lovenox, IV      nitroglycerin, rule out MI protocol with serial CK-MB and troponin      enzymes, repeat ECG.  2.  Given the patient's relative high risk and rather typical symptoms (the      only thing missing is an exertional component), I would favor the more     direct and invasive route for diagnosis, which is cardiac       catheterization and coronary angiography, possible PCI.  Will discuss  further with the patient after more data available.   Thank you very much for allowing me to assist in the care of Mr. [name].  It has been a pleasure to do so.  I will discuss his further care with you.       JHE/MEDQ  D:  07/11/2004  T:  07/11/2004  Job:  [id number]

## 2010-06-10 NOTE — Cardiovascular Report (Signed)
NAMELAYN, [name]                   ACCOUNT NO.:  [id number]   MEDICAL RECORD NO.:  [id number]          PATIENT TYPE:  INP   LOCATION:  4712                         FACILITY:  [facility]   PHYSICIAN:  Francisca December, M.D.  DATE OF BIRTH:  September 10, 1950   DATE OF PROCEDURE:  07/12/2004  DATE OF DISCHARGE:                              CARDIAC CATHETERIZATION   PROCEDURES PERFORMED:  1.  Left heart catheterization.  2.  Left ventriculogram.  3.  Coronary angiography.  4.  PCI/DE stent implantation to ramus intermedius proximal.  5.  PCI/DE stent implantation to proximal LAD.   INDICATIONS:  [name] is a 60 year old man who presented yesterday with  prolonged chest discomfort that was waxing and waning throughout the day.  There were no diagnostic electrocardiographic changes. His second set of  cardiac enzymes was significantly elevated. The discomfort resolved  following intravenous nitroglycerin. He is brought to the catheterization  laboratory at this time to identify the extent of disease and provide for  further therapeutic options with a diagnosis of a non-ST-segment elevation  myocardial infarction.   PROCEDURAL NOTE:  The patient was brought to the cardiac catheterization  laboratory in the fasting state. The right groin was prepped and draped in  the usual sterile fashion. Local anesthesia was obtained with the  infiltration of 1% lidocaine. A 6-French catheter sheath was inserted  percutaneously into the right femoral artery utilizing an anterior approach  over a guiding J-wire. Left heart catheterization then proceeded in the  standard fashion using 6-French #5 left Judkins, 6-French #4 right Judkins  and 110 cm 6-French pigtail catheters. All catheter manipulations were  performed using fluoroscopic observation and exchanges performed over a long  guiding J-wire. At the completion of diagnostic procedure, I proceeded with  coronary intervention.   A 6-French #4.5 CLS left  guiding catheter was advanced to the ascending  aorta where the left coronary os was engaged. Then 7000 units of heparin  were given intravenously, as well as a double bolus and constant infusion of  Integrilin. The resultant ACT was 234 seconds. A 0.014 XT100 guidewire was  passed across a complete occlusion in the ramus intermedius without  difficulty. Initial balloon dilatation was performed with a 2.5/20 mm Scimed  Maverick intracoronary balloon. This was inflated to 6 atmospheres for one  minute. This balloon was deflated and the removed. Antegrade flow was  reestablished. A 2.75/16 mm Scimed Taxus intracoronary drug-eluting stent  was then advanced into place, carefully positioned and deployed at a peak  pressure of 16 atmospheres. Following deflation of the stent balloon and  confirmation of adequate patency in orthogonal views, the guidewire was  removed. A 0.014 inch Luge intracoronary guidewire was then advanced across  a focal eccentric lesion in the anterior descending artery proximally. The  lesion was primarily stented using 83.5/12 mm Cordis Cypher drug-eluting  stent. This was carefully positioned and deployed at a peak pressure of 14  atmospheres. The stent balloon was deflated and removed and a 4.0/12 mm  Scimed Quantum intracoronary balloon was advanced into place carefully  within the stent and inflated to a peak pressure of 16 atmospheres. These  maneuvers resulted in wide patency of the anterior descending artery, which  was confirmed in orthogonal views both with and without the guidewire in  place. The guiding catheter was then removed. A right femoral arteriogram in  the 45 degree RAO angulation confirmed adequate anatomy for placement of the  percutaneous closure device, Angio-Seal. This was successfully deployed with  good hemostasis and an intact distal pulse.   HEMODYNAMICS:  The systemic arterial pressure was 116/70 with a mean of 89  mmHg. There was no  systolic gradient across the aortic valve. The left  ventricular end-diastolic pressure was 17 mmHg pre ventriculogram.   ANGIOGRAPHY:  The left ventriculogram demonstrated normal chamber size and  normal global systolic function. A visual estimate of the ejection fraction  is 55-60%. There is anterolateral apical focal akinesis. There is no mitral  regurgitation and the aortic valve is trileaflet, opening normally during  systole. There is no coronary calcification.   There is a right dominant coronary system present. The main left coronary is  normal.   The left anterior descending artery and its branches are highly diseased;  the vessel contains luminal irregularities throughout and is diffusely  diseased, especially in the more apical portion. In the proximal portion  just before the origin of the first septal perforator and first diagonal,  which is relatively small, there is an eccentric 70% focal stenosis. The  ongoing anterior descending artery gives rise to two additional diagonal  branches. The second diagonal is quite large. The third diagonal is small.  The vessel does reach and barely traverses the apex.   The left circumflex coronary and its branches are highly diseased; as  mentioned above, the ramus intermedius is completely occluded. The  circumflex artery itself contains luminal irregularities. There is a large  second marginal branch that bifurcates on the basal lateral segment of the  LV. The ongoing circumflex in the AV groove is small and gives rise only to  a very small posterolateral branch.   The right coronary artery and its branches are highly diseased; the vessel  again is diffusely diseased with luminal irregularities/20% stenoses  throughout. The caliber of the vessel is not greater than 2.5 mm. This is  true of the proximal and mid portion. In the distal portion, it becomes somewhat larger, but there is a focal concentric 90% stenosis present. The   ongoing right coronary gives rise to a moderate size posterior descending  artery without significant obstruction and a small posterolateral segment  with two very small left ventricular branches.   Collateral vessels are not seen.   FINAL IMPRESSION:  1.  Atherosclerotic cardiovascular disease, three-vessel.  2.  Status post non-ST segment elevation myocardial infarction in the      distribution of the ramus intermedius.  3.  Intact global left ventricular size and systolic function with regional      wall motion abnormalities noted. Ejection fraction 55-60%.  4.  Status post successful PCI/drug-eluting stent implantation to proximal      ramus intermedius.  5.  Status post successful PCI/drug-eluting stent implantation to proximal      anterior descending artery.       JHE/MEDQ  D:  07/12/2004  T:  07/12/2004  Job:  [id number]

## 2010-06-10 NOTE — H&P (Signed)
[name], [name]                   ACCOUNT NO.:  [id number]   MEDICAL RECORD NO.:  [id number]           PATIENT TYPE:   LOCATION:                                 FACILITY:   PHYSICIAN:  Deirdre Peer. Polite, M.D.      DATE OF BIRTH:   DATE OF ADMISSION:  DATE OF DISCHARGE:                              HISTORY & PHYSICAL   CHIEF COMPLAINT:  Left foot swelling and redness and drainage.   HISTORY OF PRESENT ILLNESS:  A 60 year old male with multiple medical  problems presented to the office with the above chief complaint.  Of  note, the patient has a known history of diabetic foot ulcer on the left  foot, particularly at the base of his second toe which is being  monitored by a podiatrist on an outpatient basis with debridement and as  of late, plans for surgery.  Over the weekend, the patient has noticed  fever as high as 101 and chills.  He also had some upper respiratory  symptoms.  Because of that complaint, the patient presented to the  office.  In the office, the patient was evaluated and currently was  afebrile, however, had been taking acetaminophen for his temperature.  Examination was remarkable for left foot soft tissue swelling and  significant erythema of the whole second toe and also at the base of the  second toe with a foul odor.  The patient was sent for an x-ray and  admission was deemed necessary to the hospital for further evaluation  and treatment.   PAST MEDICAL HISTORY:  Past medical history is significant for:  1. Diabetes with associated neuropathy.  2. Chronic foot ulcer followed as an outpatient by Dr. Harriet Pho.  3. Hypertension.  4. Gastroesophageal reflux disease.  5. Obstructive sleep apnea.  6. Hypothyroidism.  7. Non-Q myocardial infarction in 2006.  8. Status post percutaneous transluminal coronary angioplasty with      drug eluting stent at the ramus intermedius and percutaneous      transluminal coronary angioplasty with drug eluting stent in the    proximal left anterior descending in 06/06 by Dr. Corliss Marcus.      The patient also had distal right coronary artery implantation      stent with good left ventricular function.   MEDICATIONS:  Current medications include:  1. Plavix 75 mg daily;  2. Multivitamins daily;  3. Glucophage one gram twice daily;  4. Glucosamine chondroitin one tablet daily;  5. Iron supplemental, which he states that he has not been taking      lately;  6. Lipitor 40 mg daily;  7. Lisinopril 20 mg daily;  8. Ritalin 20 mg daily;  9. Sonata 10 mg q. h.s. p.r.n.;  10.Synthroid 175 mcg daily;  11.CPAP at 10 cm of water q. H.s.   SOCIAL HISTORY:  Negative for tobacco.  Rare alcohol.  No drugs.   PAST SURGICAL HISTORY:  1. Right knee arthroscopy in 1994.  2. LASIK surgery in 1999.  3. Eyelid surgery 2001.  4. Bone spur removed left big toe  01/02.   Health maintenance in this patient up to date with immunizations, PTP in  2003.   REVIEW OF SYSTEMS:  As stated in the history of present illness.   FAMILY HISTORY:  Father had a history of precancerous colon polyp.  Mother with hypertension and cardiac disease in her 54's, also diabetes  mellitus.  Sister in good health.   PHYSICAL EXAMINATION:  GENERAL:  The patient is alert and oriented x3.  VITAL SIGNS:  Blood pressure is 122/82, weight 298, pulse 80,  temperature 98.4.  HEENT:  Within normal limits.  CHEST:  Clear to auscultation bilaterally.  CARDIOVASCULAR:  Regular.  No S3.  ABDOMEN:  Soft and nontender.  EXTREMITIES:  Left foot soft tissue swelling, quarter-sized ulcer at the  base of the second toe on the plantar surface with foul odor emanating.  No purulent drainage though.  The patient has significant erythema of  the entire second toe on the left.  Pulses are intact.  RECTAL:  Deferred.  NEUROLOGICAL:  Significant for neuropathy in the lower extremities.   ASSESSMENT:  1. Diabetic foot infection, second toe with obvious  cellulitis,      plus/minus osteomyelitis.  2. Chronic foot ulcer seen on an outpatient basis by podiatrist.  3. Diabetes.  4. Hypertension.  5. Gastroesophageal reflux disease.  6. Obstructive sleep apnea.  7. Hypothyroidism.  8. Fever.   PLAN:  Recommend the patient be admitted to a medical floor bed for  intravenous antibiotics.  The patient will have blood cultures, wound  culture, MRI to rule out associated osteomyelitis as well as ABI.  Please note that the patient already had ABI done on an outpatient basis  by his podiatrist and was told that it was within normal limits.  As the  patient had plans for outpatient surgery, may warrant surgery during  this hospitalization.  Will resume the patient outpatient medications as  well as CPAP q. h.s.      Deirdre Peer. Polite, M.D.  Electronically Signed    RDP/MEDQ  D:  02/19/2006  T:  02/19/2006  Job:  [id number]

## 2010-06-10 NOTE — Discharge Summary (Signed)
[name], [name]                   ACCOUNT NO.:  [id number]   MEDICAL RECORD NO.:  [id number]          PATIENT TYPE:  INP   LOCATION:  5015                         FACILITY:  [facility]   PHYSICIAN:  Thora Lance, M.D.  DATE OF BIRTH:  1950-08-14   DATE OF ADMISSION:  02/19/2006  DATE OF DISCHARGE:  02/26/2006                               DISCHARGE SUMMARY   REASON FOR ADMISSION:  A 60 year old white male with a history of  diabetes with a known history of a diabetic foot ulcer on the foot at  the base of his left second toe.  Over the weekend prior to admission,  he had noticed fevers as high as 101, chills and the development of left  foot soft tissue swelling and significant erythema.   SIGNIFICANT FINDINGS:  VITALS:  Blood pressure 122/82, heart rate 80,  temperature 90.4  LUNGS:  Clear.  HEART:  Regular rate and rhythm  EXTREMITIES:  Left foot soft tissue swelling, quarter-size ulcer at  bases of the second toe and the plantar surface with a foul odor  emanating and surrounding erythema.   LABORATORY:  CBC:  WBC 10.8, hemoglobin 13.1, platelet count 214.  Chemistry:  Sodium 136, potassium 3.5, chloride 103, bicarbonate 23,  glucose 143, BUN 12, creatinine 0.9, total protein 7.0, albumin 3.3, AST  20, ALT 21, alk phos 49, total bilirubin 2.0.   HOSPITAL COURSE:  The patient was admitted with left diabetic foot  ulcer.  He was placed on IV Vancomycin and Levaquin.  Cultures were  obtained.  Gram-stain showed Gram-positive cocci in pairs.  The  orthopedics service was consulted.  MRI showed abscess without definite  evidence of osteomyelitis.  Also, probes done by orthopedics were  consistent with osteomyelitis.  Patient was seen by the infectious  disease service.  The patient was switched to Vancomycin, Flagyl and  Fortaz.  On February 1, the patient underwent a left foot secondary  resection by Dr. Magnus Ivan.  The patient tolerated this well.  Abscess  cultures came back  with MRSA and group B strep and outpatient culture  had shown Bacteroides.  Blood cultures remain negative.  The patient was  continued on IV antibiotics.  He did well postoperatively and was  discharged on oral Septra and Flagyl for a total of two weeks as  recommended by infectious disease.   DISCHARGE DIAGNOSES:  1. Left foot abscess.  2. Left foot diabetic ulcer.  3. Diabetes mellitus.  4. Hypertension.  5. Coronary artery disease.  6. Gastroesophageal reflux disease.  7. Obstructive sleep apnea.  8. Hypothyroidism.   PROCEDURES:  1. Left toe secondary resection.  2. MRI of the foot.   DISCHARGE MEDICATIONS:  1. Flagyl 500 mg p.o. t.i.d. ten days.  2. Septra DS one p.o. b.i.d. ten days.  3. Plavix 75 mg p.o. q.d.  4. Multivitamin q.d.  5. Metformin 1 gm b.i.d.  6. Glucosamine and chondroitin one tablet daily.  7. Lipitor 40 mg q.d.  8. Lisinopril 20 mg q.d.  9. Ritalin 20 mg q.d.  10.Sonata 10 mg h.s.  p.r.n.  11.Synthroid 175 mg a day.  12.CPAP 10 cm of H2O.   Follow up with Dr. Valentina Lucks next regular scheduled appointment.  Follow  up with Dr. Magnus Ivan in one week.   DIET:  Low-sodium diabetic diet.   ACTIVITY:  As per orthopedics.           ______________________________  Thora Lance, M.D.     JJG/MEDQ  D:  04/19/2006  T:  04/19/2006  Job:  [id number]   cc:   Vanita Panda. Magnus Ivan, M.D.

## 2010-06-16 ENCOUNTER — Other Ambulatory Visit (INDEPENDENT_AMBULATORY_CARE_PROVIDER_SITE_OTHER): Payer: Self-pay | Admitting: Surgery

## 2010-06-16 DIAGNOSIS — E669 Obesity, unspecified: Secondary | ICD-10-CM

## 2010-06-28 ENCOUNTER — Ambulatory Visit (HOSPITAL_COMMUNITY)
Admission: RE | Admit: 2010-06-28 | Discharge: 2010-06-28 | Disposition: A | Discharge status: Home / Self Care | Payer: BC Managed Care – PPO | Source: Ambulatory Visit | Attending: Surgery | Admitting: Surgery

## 2010-06-28 DIAGNOSIS — Z01818 Encounter for other preprocedural examination: Secondary | ICD-10-CM | POA: Insufficient documentation

## 2010-06-28 DIAGNOSIS — E669 Obesity, unspecified: Secondary | ICD-10-CM

## 2010-06-28 DIAGNOSIS — Z01810 Encounter for preprocedural cardiovascular examination: Secondary | ICD-10-CM | POA: Insufficient documentation

## 2010-06-28 DIAGNOSIS — E119 Type 2 diabetes mellitus without complications: Secondary | ICD-10-CM | POA: Insufficient documentation

## 2010-06-28 DIAGNOSIS — K8020 Calculus of gallbladder without cholecystitis without obstruction: Secondary | ICD-10-CM | POA: Insufficient documentation

## 2010-06-28 DIAGNOSIS — I10 Essential (primary) hypertension: Secondary | ICD-10-CM | POA: Insufficient documentation

## 2010-07-01 ENCOUNTER — Ambulatory Visit (HOSPITAL_COMMUNITY)
Admission: RE | Admit: 2010-07-01 | Discharge: 2010-07-01 | Disposition: A | Discharge status: Home / Self Care | Payer: BC Managed Care – PPO | Source: Ambulatory Visit | Attending: Surgery | Admitting: Surgery

## 2010-07-01 DIAGNOSIS — Z6839 Body mass index (BMI) 39.0-39.9, adult: Secondary | ICD-10-CM | POA: Insufficient documentation

## 2010-07-01 DIAGNOSIS — Z01818 Encounter for other preprocedural examination: Secondary | ICD-10-CM | POA: Insufficient documentation

## 2010-07-04 ENCOUNTER — Telehealth: Payer: Self-pay | Admitting: Internal Medicine

## 2010-07-04 MED ORDER — METHYLPHENIDATE HCL 20 MG PO TBCR: EXTENDED_RELEASE_TABLET | ORAL | Status: DC

## 2010-07-04 NOTE — Telephone Encounter (Signed)
Called and spoke with pt.  Pt aware rx ready for pick up at the front desk.

## 2010-07-04 NOTE — Telephone Encounter (Signed)
Spoke with pt and he wishes to pick this rx up when ready. Rx printed and placed on CDY's cart to be signed.

## 2010-07-04 NOTE — Telephone Encounter (Signed)
Ready for pick up

## 2010-07-12 ENCOUNTER — Encounter: Payer: BC Managed Care – PPO | Attending: Surgery | Admitting: *Deleted

## 2010-07-12 DIAGNOSIS — Z01818 Encounter for other preprocedural examination: Secondary | ICD-10-CM | POA: Insufficient documentation

## 2010-07-12 DIAGNOSIS — Z713 Dietary counseling and surveillance: Secondary | ICD-10-CM | POA: Insufficient documentation

## 2010-07-26 ENCOUNTER — Encounter: Payer: Self-pay | Admitting: Internal Medicine

## 2010-07-26 ENCOUNTER — Ambulatory Visit (INDEPENDENT_AMBULATORY_CARE_PROVIDER_SITE_OTHER): Payer: BC Managed Care – PPO | Admitting: Internal Medicine

## 2010-07-26 VITALS — BP 132/70 | HR 75 | Ht >= 80 in | Wt 363.2 lb

## 2010-07-26 DIAGNOSIS — E669 Obesity, unspecified: Secondary | ICD-10-CM

## 2010-07-26 DIAGNOSIS — G4733 Obstructive sleep apnea (adult) (pediatric): Secondary | ICD-10-CM

## 2010-07-26 MED ORDER — ZALEPLON 10 MG PO CAPS: 10.0000 mg | ORAL_CAPSULE | Freq: Every evening | ORAL | Status: DC | PRN

## 2010-07-26 MED ORDER — METHYLPHENIDATE HCL 20 MG PO TBCR: EXTENDED_RELEASE_TABLET | ORAL | Status: DC

## 2010-07-26 NOTE — Assessment & Plan Note (Signed)
Succesfull weight loss would help most of his problems as discussed. He seems ok from my standpoint to go forward with surgery.

## 2010-07-26 NOTE — Patient Instructions (Signed)
Scripts refilled for Ritalin and Sonata. Adderall has been removed from med list  Continue CPAP at 10. Discuss your use of CPAP with your anesthesiologist and surgeon

## 2010-07-26 NOTE — Progress Notes (Signed)
  Subjective:    Patient ID: [name], male    DOB: Mar 13, 1950, 60 y.o.   MRN: [id number]  HPI 07/26/10- 5 yoM never smoker, followed for OSA with hypersomnia, obesity, complicated by DM, CAD/ Hx MI, GERD, hypothyroid. Last here July 27, 2009 - note reviewed He is using Ritalin now, after some availability problems last year. Takes 1x Ritalin 20 mg SR once daily, every day. Effective and well tolerated.  CPAP all night every night with no problems, from [facility] still at 10 cwp.  Uses Sonata 10 mg only once or twice per month, or maybe an Aleve PM. He is being evaluated for gastric bypass bariatric surgery with Dr Daphine Deutscher. We discussed CPAP adjustments that may be appropriate with weight gain, and also issues of post-op safety and sleep apnea.   Review of Systems Constitutional:   No weight loss, night sweats,  Fevers, chills, fatigue, lassitude. HEENT:   No headaches,  Difficulty swallowing,  Tooth/dental problems,  Sore throat,                No sneezing, itching, ear ache, nasal congestion, post nasal drip,   CV:  No chest pain,  Orthopnea, PND, swelling in lower extremities, anasarca, dizziness, palpitations  GI  No heartburn, indigestion, abdominal pain, nausea, vomiting, diarrhea, change in bowel habits, loss of appetite  Resp: No acute  shortness of breath with exertion or at rest.  No excess mucus, no productive cough,  No non-productive cough,  No coughing up of blood.  No change in color of mucus.  No wheezing.    Skin: no rash or lesions.  GU: no dysuria, change in color of urine, no urgency or frequency.  No flank pain.  MS:  No joint pain or swelling.  No decreased range of motion.  No back pain.  Psych:  No change in mood or affect. No depression or anxiety.  No memory loss.      Objective:   Physical Exam General- Alert, Oriented, Affect-appropriate, Distress- none acute                obese, beard  Skin- rash-none, lesions- none, excoriation- none  Lymphadenopathy-  none  Head- atraumatic  Eyes- Gross vision intact, PERRLA, conjunctivae clear  secretions  Ears- Hearing, canals, Tm - normal  Nose- Clear, No- Septal dev, mucus, polyps, erosion, perforation   Throat- Mallampati III , mucosa clear , drainage- none, tonsils- atrophic  Neck- flexible , trachea midline, no stridor , thyroid nl, carotid no bruit  Chest - symmetrical excursion , unlabored     Heart/CV- RRR , no murmur , no gallop  , no rub, nl s1 s2                     - JVD- none , edema- none, stasis changes- none, varices- none     Lung- clear to P&A, wheeze- none, cough- none , dullness-none, rub- none     Chest wall-   Abd- tender-no, distended-no, bowel sounds-present, HSM- no  Br/ Gen/ Rectal- Not done, not indicated  Extrem- cyanosis- none, clubbing, none, atrophy- none, strength- nl  Neuro- grossly intact to observation         Assessment & Plan:

## 2010-07-26 NOTE — Assessment & Plan Note (Signed)
Great compliance and control on 10 cwp. He is going to call [facility] about routine mask replacement. We discussed taking his mask to the hospital at time of surgery , changes as he loses weight and etc.

## 2010-08-31 ENCOUNTER — Encounter: Payer: Self-pay | Admitting: Internal Medicine

## 2010-09-05 ENCOUNTER — Telehealth: Payer: Self-pay | Admitting: Internal Medicine

## 2010-09-05 NOTE — Telephone Encounter (Signed)
Pt states is currently taking generic Ritalin 20mg  SR. Is scheduled to have Gastric Bypass Surgery 10-10-10 and states can not have any time release medications post op and will need an alternative. Is aware message maybe addressed tomorrow. Please advise. Thank you.   Allergies  Allergen Reactions  . Penicillins

## 2010-09-06 NOTE — Telephone Encounter (Signed)
When he is close to having the surgery, or next time he orders, he needs to remind [place] and i will change him to 10 mg regular tabs. He will have to adjust his dose till it feels right.

## 2010-09-06 NOTE — Telephone Encounter (Signed)
LMOMTCB x 1 

## 2010-09-07 NOTE — Telephone Encounter (Signed)
Pt informed of CDY's rec and Pt verbalized understanding

## 2010-09-13 ENCOUNTER — Inpatient Hospital Stay (HOSPITAL_COMMUNITY): Admit: 2010-09-13 | Payer: Self-pay | Admitting: Surgery

## 2010-09-21 ENCOUNTER — Telehealth (INDEPENDENT_AMBULATORY_CARE_PROVIDER_SITE_OTHER): Payer: Self-pay | Admitting: General Surgery

## 2010-09-21 NOTE — Telephone Encounter (Signed)
[name] called stating that his cardiologist Dr. Minus Breeding has directed him to d/c his plavix 7 days prior to his procedure but would like him to remain on his ASA 81 mg during this time. Dr. Daphine Deutscher has agreed to this, notified the patient. He also stated he had a stress test done last week, contacting his cardiologist for results.

## 2010-09-22 ENCOUNTER — Encounter: Payer: BC Managed Care – PPO | Attending: Surgery | Admitting: *Deleted

## 2010-09-22 DIAGNOSIS — Z01818 Encounter for other preprocedural examination: Secondary | ICD-10-CM | POA: Insufficient documentation

## 2010-09-22 DIAGNOSIS — Z713 Dietary counseling and surveillance: Secondary | ICD-10-CM | POA: Insufficient documentation

## 2010-09-22 NOTE — Patient Instructions (Signed)
Follow:    Pre-Op Diet per MD 2 weeks prior to surgery  Phase 2- Liquids (clear/full) 2 weeks after surgery  Vitamin/Mineral/Calcium guidelines for purchasing bariatric supplements  Exercise guidelines pre and post-op per MD  Follow-up at [facility] in 2 weeks post-op for diet advancement. Contact Heyden Jaber as needed with questions/concerns.  

## 2010-09-22 NOTE — Progress Notes (Signed)
  Pre-Operative Nutrition Class  Patient was seen on 09/22/2010 for Pre-Operative Nutrition education at the [facility].   Surgery date: 10/10/10 Surgery type: Gastric Byass  Weight today: 358.7 lb  Weight change: 3.6 lb gain Total weight lost: n/a BMI: 39.4%  Samples given per MNT protocol: Bariatric Advantage Multivitamin Lot # [id number] Exp: 12/12  Bariatric Advantage Calcium Citrate Lot # [id number] Exp: 10/13  Celebrate Vitamins Multivitamin Lot # [id number] Exp: 4/13  Celebrate VitaminsCalcium Citrate Lot # [id number] Exp: 4/13  Jerrilyn Cairo Protein Powder Lot # [id number] Exp: 1/14  The following the learning objective met by the patient during this course:   Identifies Pre-Op Dietary Goals and will begin 2 weeks pre-operatively   Identifies appropriate sources of fluids and proteins   States protein recommendations and appropriate sources pre and post-operatively  Identifies Post-Operative Dietary Goals and will follow for 2 weeks post-operatively  Identifies appropriate multivitamin and calcium sources  Describes the need for physical activity post-operatively and will follow MD recommendations  States when to call healthcare provider regarding medication questions or post-operative complications  Handouts given during class include:  Pre-Op Bariatric Surgery Diet Handout  Protein Shake Handout  Post-Op Bariatric Surgery Nutrition Handout  BELT Program Information Flyer  Support Group Information Flyer  Follow-Up Plan: Patient will follow-up at [facility] 2 weeks post operatively for diet advancement per MD.

## 2010-10-03 ENCOUNTER — Telehealth: Payer: Self-pay | Admitting: Internal Medicine

## 2010-10-03 NOTE — Telephone Encounter (Signed)
Waymon Budge, MD 09/06/2010 9:10 AM Signed  When he is close to having the surgery, or next time he orders, he needs to remind [place] and i will change him to 10 mg regular tabs. He will have to adjust his dose till it feels right.   CY; please advise of directions to put on RX and I will print for you to sign . Thanks.

## 2010-10-03 NOTE — Telephone Encounter (Signed)
D/C his Ritalin ER 20 mg  New Rx for Ritalin 10 mg, # 60   1 twice daily as needed. Blood levels will go up and down more quickly with this,so he will have to adjust when and how he takes it.

## 2010-10-03 NOTE — Telephone Encounter (Signed)
Spoke with pt. States that he needs new rx for ritalin. States that he has been taking 20 mg sr, but after next wk will not be able to take meds that are sr due to having gastric bypass surgery. Please advise new rx strength and directions. I advised the pt we would call him when this was ready. Please advise thanks!

## 2010-10-04 ENCOUNTER — Other Ambulatory Visit (INDEPENDENT_AMBULATORY_CARE_PROVIDER_SITE_OTHER): Payer: Self-pay | Admitting: Surgery

## 2010-10-04 ENCOUNTER — Encounter (HOSPITAL_COMMUNITY): Payer: BC Managed Care – PPO

## 2010-10-04 LAB — COMPREHENSIVE METABOLIC PANEL
ALT: 51 U/L (ref 0–53)
AST: 39 U/L — ABNORMAL HIGH (ref 0–37)
Albumin: 4.3 g/dL (ref 3.5–5.2)
Alkaline Phosphatase: 59 U/L (ref 39–117)
BUN: 21 mg/dL (ref 6–23)
CO2: 24 mEq/L (ref 19–32)
Calcium: 9.9 mg/dL (ref 8.4–10.5)
Chloride: 95 mEq/L — ABNORMAL LOW (ref 96–112)
Creatinine, Ser: 1.25 mg/dL (ref 0.50–1.35)
GFR calc Af Amer: >60 mL/min (ref >60)
GFR calc non Af Amer: 59 mL/min — ABNORMAL LOW (ref >60)
Glucose, Bld: 131 mg/dL — ABNORMAL HIGH (ref 70–99)
Potassium: 4.5 mEq/L (ref 3.5–5.1)
Sodium: 133 mEq/L — ABNORMAL LOW (ref 135–145)
Total Bilirubin: 0.5 mg/dL (ref 0.3–1.2)
Total Protein: 8 g/dL (ref 6.0–8.3)

## 2010-10-04 LAB — DIFFERENTIAL
Basophils Absolute: 0 10*3/uL (ref 0.0–0.1)
Basophils Relative: 0 % (ref 0–1)
Eosinophils Absolute: 0.2 10*3/uL (ref 0.0–0.7)
Eosinophils Relative: 3 % (ref 0–5)
Lymphocytes Relative: 20 % (ref 12–46)
Lymphs Abs: 1.4 10*3/uL (ref 0.7–4.0)
Monocytes Absolute: 0.6 10*3/uL (ref 0.1–1.0)
Monocytes Relative: 9 % (ref 3–12)
Neutro Abs: 4.5 10*3/uL (ref 1.7–7.7)
Neutrophils Relative %: 67 % (ref 43–77)

## 2010-10-04 LAB — CBC
HCT: 41.7 % (ref 39.0–52.0)
Hemoglobin: 13.4 g/dL (ref 13.0–17.0)
MCH: 26.1 pg (ref 26.0–34.0)
MCHC: 32.1 g/dL (ref 30.0–36.0)
MCV: 81.1 fL (ref 78.0–100.0)
Platelets: 283 10*3/uL (ref 150–400)
RBC: 5.14 MIL/uL (ref 4.22–5.81)
RDW: 16.1 % — ABNORMAL HIGH (ref 11.5–15.5)
WBC: 6.7 10*3/uL (ref 4.0–10.5)

## 2010-10-04 LAB — SURGICAL PCR SCREEN
MRSA, PCR: POSITIVE — AB
Staphylococcus aureus: POSITIVE — AB

## 2010-10-04 MED ORDER — METHYLPHENIDATE HCL 10 MG PO TABS: 10.0000 mg | ORAL_TABLET | Freq: Two times a day (BID) | ORAL | Status: DC

## 2010-10-04 NOTE — Telephone Encounter (Signed)
Rx printed, signed, and at front for pick up-pt aware.

## 2010-10-06 ENCOUNTER — Ambulatory Visit (INDEPENDENT_AMBULATORY_CARE_PROVIDER_SITE_OTHER): Payer: BC Managed Care – PPO | Admitting: Surgery

## 2010-10-06 ENCOUNTER — Encounter (INDEPENDENT_AMBULATORY_CARE_PROVIDER_SITE_OTHER): Payer: Self-pay | Admitting: Surgery

## 2010-10-06 DIAGNOSIS — E669 Obesity, unspecified: Secondary | ICD-10-CM

## 2010-10-06 DIAGNOSIS — E119 Type 2 diabetes mellitus without complications: Secondary | ICD-10-CM

## 2010-10-06 NOTE — Progress Notes (Signed)
Subjective:     Patient ID: [name], male   DOB: August 30, 1950, 60 y.o.   MRN: [id number]  HPI [name] presented back in May to discuss laparoscopic Roux-en-Y gastric bypass. He is followed by Dr. Kirby Funk. His BMI is 39 and he has type 2 diabetes for about 10 years with some peripheral neuropathy and has had an amputation of the toe for that. In addition he's had Graves' disease and required radioactive ablation in October of 1995. Other comorbidities include obstructive sleep apnea for which he uses a CPAP machine, coronary artery disease, degenerative joint disease of his right ankle, GERD, and hypothyroidism related to the treatment of his Graves'. He's had knee surgery for torn torn cartilage. He had an MI with 2 stents placed in 2006.  He has had full workup including psychologic meeting and is set for a left Roux-en-Y gastric bypass. His upper GI is unremarkable he does have some little axial rotation of the stomach compared its fundus no volvulus was seen. He does have gallstones. Lab work is otherwise unremarkable except for hemoglobin A1c of 8. His breath tek study for H. Pylori was negative  He is ready for laparoscopic Roux-en-Y gastric bypass surgery.  Current Outpatient Prescriptions  Medication Sig Dispense Refill  . aspirin 81 MG tablet Take 81 mg by mouth daily.        Marland Kitchen atorvastatin (LIPITOR) 40 MG tablet Take 40 mg by mouth daily.        . clopidogrel (PLAVIX) 75 MG tablet Take 75 mg by mouth daily.        Marland Kitchen glimepiride (AMARYL) 4 MG tablet Take 4 mg by mouth daily before breakfast.        . Glucosamine-MSM-Hyaluronic Acd (JOINT HEALTH) 750-375-30 MG TABS Take 3 tablets by mouth daily.        Marland Kitchen levothyroxine (SYNTHROID, LEVOTHROID) 200 MCG tablet Take 200 mcg by mouth daily.        Marland Kitchen lisinopril (PRINIVIL,ZESTRIL) 20 MG tablet Take 20 mg by mouth daily.        . methylphenidate (RITALIN) 10 MG tablet Take 1 tablet (10 mg total) by mouth 2 (two) times daily.  60 tablet  0    . NON FORMULARY Osteo Matrix capsules(multivitamin)  Take 4 by mouth daily       . OMEGA 3 1000 MG CAPS Take 3 capsules by mouth daily.        . pioglitazone (ACTOS) 30 MG tablet Take 30 mg by mouth daily.        . tadalafil (CIALIS) 20 MG tablet Take 20 mg by mouth daily as needed.        . zaleplon (SONATA) 10 MG capsule Take 1 capsule (10 mg total) by mouth at bedtime as needed.  30 capsule  prn   Past Medical History  Diagnosis Date  . Sleep apnea   . Diabetes mellitus   . Hyperlipidemia   . Hypertension   . Heart murmur   . Thyroid disease   . Heart attack    Past Surgical History  Procedure Date  . Coronary angioplasty with stent placement   . Left second toe amputation of mrsa osteomyelitis 2008   History   Social History  . Marital Status: Single    Spouse Name: N/A    Number of Children: N/A  . Years of Education: N/A   Occupational History  . CPA taxes    Social History Main Topics  . Smoking status:  Never Smoker   . Smokeless tobacco: Not on file  . Alcohol Use: No  . Drug Use: No  . Sexually Active: Not on file   Other Topics Concern  . Not on file   Social History Narrative   Parents and sister all using CPAP  History reviewed. No pertinent family history.  Review of Systems  Review of systems is normal except for a history of MRSA, hypertension, history of MI, an diabetes, thyroid disease, and musculoskeletal changes in his feet related to a digital amputation.     Objective:   Physical ExamBlood pressure 132/88, pulse 68, temperature 96.6 F (35.9 C), temperature source Temporal, resp. rate 14, height 6\' 8"  (2.032 m), weight 344 lb 9.6 oz (156.31 kg).      Assessment:      Type II DM (x22yrs) and morbid obesity    Plan:     Lap Roux en Y gastric bypass

## 2010-10-10 ENCOUNTER — Inpatient Hospital Stay (HOSPITAL_COMMUNITY)
Admission: RE | Admit: 2010-10-10 | Discharge: 2010-10-13 | DRG: 288 | Disposition: A | Discharge status: Home / Self Care | Payer: BC Managed Care – PPO | Source: Ambulatory Visit | Attending: Surgery | Admitting: Surgery

## 2010-10-10 DIAGNOSIS — Z01812 Encounter for preprocedural laboratory examination: Secondary | ICD-10-CM

## 2010-10-10 DIAGNOSIS — I2510 Atherosclerotic heart disease of native coronary artery without angina pectoris: Secondary | ICD-10-CM | POA: Diagnosis present

## 2010-10-10 DIAGNOSIS — G4733 Obstructive sleep apnea (adult) (pediatric): Secondary | ICD-10-CM | POA: Diagnosis present

## 2010-10-10 DIAGNOSIS — Z79899 Other long term (current) drug therapy: Secondary | ICD-10-CM

## 2010-10-10 DIAGNOSIS — I498 Other specified cardiac arrhythmias: Secondary | ICD-10-CM | POA: Diagnosis present

## 2010-10-10 DIAGNOSIS — Z6838 Body mass index (BMI) 38.0-38.9, adult: Secondary | ICD-10-CM

## 2010-10-10 DIAGNOSIS — I10 Essential (primary) hypertension: Secondary | ICD-10-CM | POA: Diagnosis present

## 2010-10-10 DIAGNOSIS — Z9861 Coronary angioplasty status: Secondary | ICD-10-CM

## 2010-10-10 DIAGNOSIS — K449 Diaphragmatic hernia without obstruction or gangrene: Secondary | ICD-10-CM | POA: Diagnosis present

## 2010-10-10 DIAGNOSIS — K219 Gastro-esophageal reflux disease without esophagitis: Secondary | ICD-10-CM | POA: Diagnosis present

## 2010-10-10 DIAGNOSIS — E119 Type 2 diabetes mellitus without complications: Secondary | ICD-10-CM

## 2010-10-10 DIAGNOSIS — E1142 Type 2 diabetes mellitus with diabetic polyneuropathy: Secondary | ICD-10-CM | POA: Diagnosis present

## 2010-10-10 DIAGNOSIS — I252 Old myocardial infarction: Secondary | ICD-10-CM

## 2010-10-10 DIAGNOSIS — Z7982 Long term (current) use of aspirin: Secondary | ICD-10-CM

## 2010-10-10 DIAGNOSIS — E785 Hyperlipidemia, unspecified: Secondary | ICD-10-CM | POA: Diagnosis present

## 2010-10-10 DIAGNOSIS — E1149 Type 2 diabetes mellitus with other diabetic neurological complication: Secondary | ICD-10-CM | POA: Diagnosis present

## 2010-10-10 HISTORY — PX: ROUX-EN-Y GASTRIC BYPASS: SHX1104

## 2010-10-10 LAB — GLUCOSE, CAPILLARY
Glucose-Capillary: 170 mg/dL — ABNORMAL HIGH (ref 70–99)
Glucose-Capillary: 231 mg/dL — ABNORMAL HIGH (ref 70–99)
Glucose-Capillary: 195 mg/dL — ABNORMAL HIGH (ref 70–99)

## 2010-10-10 LAB — HEMOGLOBIN AND HEMATOCRIT, BLOOD
HCT: 37.5 % — ABNORMAL LOW (ref 39.0–52.0)
Hemoglobin: 12.1 g/dL — ABNORMAL LOW (ref 13.0–17.0)

## 2010-10-11 ENCOUNTER — Inpatient Hospital Stay (HOSPITAL_COMMUNITY): Payer: BC Managed Care – PPO

## 2010-10-11 DIAGNOSIS — Z9889 Other specified postprocedural states: Secondary | ICD-10-CM

## 2010-10-11 LAB — CBC
HCT: 35.9 % — ABNORMAL LOW (ref 39.0–52.0)
Hemoglobin: 11.5 g/dL — ABNORMAL LOW (ref 13.0–17.0)
MCH: 25.8 pg — ABNORMAL LOW (ref 26.0–34.0)
MCHC: 32 g/dL (ref 30.0–36.0)
MCV: 80.7 fL (ref 78.0–100.0)
Platelets: 220 10*3/uL (ref 150–400)
RBC: 4.45 MIL/uL (ref 4.22–5.81)
RDW: 15.9 % — ABNORMAL HIGH (ref 11.5–15.5)
WBC: 8.1 10*3/uL (ref 4.0–10.5)

## 2010-10-11 LAB — DIFFERENTIAL
Basophils Absolute: 0 10*3/uL (ref 0.0–0.1)
Basophils Relative: 0 % (ref 0–1)
Eosinophils Absolute: 0.1 10*3/uL (ref 0.0–0.7)
Eosinophils Relative: 1 % (ref 0–5)
Lymphocytes Relative: 11 % — ABNORMAL LOW (ref 12–46)
Lymphs Abs: 0.9 10*3/uL (ref 0.7–4.0)
Monocytes Absolute: 1 10*3/uL (ref 0.1–1.0)
Monocytes Relative: 12 % (ref 3–12)
Neutro Abs: 6.2 10*3/uL (ref 1.7–7.7)
Neutrophils Relative %: 77 % (ref 43–77)

## 2010-10-11 LAB — GLUCOSE, CAPILLARY
Glucose-Capillary: 124 mg/dL — ABNORMAL HIGH (ref 70–99)
Glucose-Capillary: 127 mg/dL — ABNORMAL HIGH (ref 70–99)
Glucose-Capillary: 144 mg/dL — ABNORMAL HIGH (ref 70–99)
Glucose-Capillary: 135 mg/dL — ABNORMAL HIGH (ref 70–99)
Glucose-Capillary: 135 mg/dL — ABNORMAL HIGH (ref 70–99)
Glucose-Capillary: 155 mg/dL — ABNORMAL HIGH (ref 70–99)
Glucose-Capillary: 150 mg/dL — ABNORMAL HIGH (ref 70–99)

## 2010-10-11 LAB — HEMOGLOBIN AND HEMATOCRIT, BLOOD
HCT: 36.7 % — ABNORMAL LOW (ref 39.0–52.0)
Hemoglobin: 11.7 g/dL — ABNORMAL LOW (ref 13.0–17.0)

## 2010-10-11 LAB — HEMOGLOBIN A1C
Hgb A1c MFr Bld: 7.5 % — ABNORMAL HIGH (ref <5.7)
Mean Plasma Glucose: 169 mg/dL — ABNORMAL HIGH (ref <117)

## 2010-10-12 LAB — CBC
HCT: 35.4 % — ABNORMAL LOW (ref 39.0–52.0)
Hemoglobin: 11.3 g/dL — ABNORMAL LOW (ref 13.0–17.0)
MCH: 25.7 pg — ABNORMAL LOW (ref 26.0–34.0)
MCHC: 31.9 g/dL (ref 30.0–36.0)
MCV: 80.6 fL (ref 78.0–100.0)
Platelets: 200 10*3/uL (ref 150–400)
RBC: 4.39 MIL/uL (ref 4.22–5.81)
RDW: 16 % — ABNORMAL HIGH (ref 11.5–15.5)
WBC: 6.8 10*3/uL (ref 4.0–10.5)

## 2010-10-12 LAB — GLUCOSE, CAPILLARY
Glucose-Capillary: 115 mg/dL — ABNORMAL HIGH (ref 70–99)
Glucose-Capillary: 123 mg/dL — ABNORMAL HIGH (ref 70–99)
Glucose-Capillary: 133 mg/dL — ABNORMAL HIGH (ref 70–99)
Glucose-Capillary: 126 mg/dL — ABNORMAL HIGH (ref 70–99)
Glucose-Capillary: 146 mg/dL — ABNORMAL HIGH (ref 70–99)
Glucose-Capillary: 123 mg/dL — ABNORMAL HIGH (ref 70–99)
Glucose-Capillary: 112 mg/dL — ABNORMAL HIGH (ref 70–99)

## 2010-10-12 LAB — BASIC METABOLIC PANEL
BUN: 8 mg/dL (ref 6–23)
CO2: 22 mEq/L (ref 19–32)
Calcium: 8.6 mg/dL (ref 8.4–10.5)
Chloride: 99 mEq/L (ref 96–112)
Creatinine, Ser: 0.92 mg/dL (ref 0.50–1.35)
GFR calc Af Amer: >60 mL/min (ref >60)
GFR calc non Af Amer: >60 mL/min (ref >60)
Glucose, Bld: 126 mg/dL — ABNORMAL HIGH (ref 70–99)
Potassium: 4 mEq/L (ref 3.5–5.1)
Sodium: 132 mEq/L — ABNORMAL LOW (ref 135–145)

## 2010-10-12 LAB — DIFFERENTIAL
Basophils Absolute: 0 10*3/uL (ref 0.0–0.1)
Basophils Relative: 0 % (ref 0–1)
Eosinophils Absolute: 0.1 10*3/uL (ref 0.0–0.7)
Eosinophils Relative: 2 % (ref 0–5)
Lymphocytes Relative: 12 % (ref 12–46)
Lymphs Abs: 0.8 10*3/uL (ref 0.7–4.0)
Monocytes Absolute: 0.8 10*3/uL (ref 0.1–1.0)
Monocytes Relative: 11 % (ref 3–12)
Neutro Abs: 5.1 10*3/uL (ref 1.7–7.7)
Neutrophils Relative %: 75 % (ref 43–77)

## 2010-10-13 LAB — GLUCOSE, CAPILLARY
Glucose-Capillary: 116 mg/dL — ABNORMAL HIGH (ref 70–99)
Glucose-Capillary: 123 mg/dL — ABNORMAL HIGH (ref 70–99)
Glucose-Capillary: 138 mg/dL — ABNORMAL HIGH (ref 70–99)

## 2010-10-18 NOTE — Op Note (Signed)
  [name], [name]                   ACCOUNT NO.:  [id number]  MEDICAL RECORD NO.:  [id number]  LOCATION:  1222                         FACILITY:  [facility]  PHYSICIAN:  Sandria Bales. Ezzard Standing, M.D.  DATE OF BIRTH:  01/12/1951  DATE OF PROCEDURE:  10/10/2010                               OPERATIVE REPORT  PREOPERATIVE DIAGNOSIS:  Morbid obesity, status post laparoscopic Roux- en-Y gastric bypass.  POSTOPERATIVE DIAGNOSIS:  Morbid obesity, status post laparoscopic Roux- en-Y gastric bypass.  PROCEDURE:  Esophagogastroduodenoscopy.  SURGEON:  Sandria Bales. Ezzard Standing, M.D.  ANESTHESIA:  General endotracheal.  BLOOD LOSS:  None.  PROCEDURE:  [name] has completed a laparoscopic Roux-en-Y gastric bypass by Dr. Daphine Deutscher.  I am doing an upper endoscopy to evaluate the pouch and evaluate for leaks.  OPERATIVE NOTE:  The patient is in a supine position.  Under general anesthesia, I passed the Olympus upper endoscope down his esophagus without difficulty.  I identified the gastroesophageal junction about 43 cm.  The gastrojejunal anastomosis at 49 cm for  a proximal 6 cm pouch. The mucosa of the pouch looked good.  There was no bleeding.  I insufflated air into the pouch while Dr. Daphine Deutscher clamped off the jejunum. He flooded the upper abdomen with saline.  There was no bubbling or evidence of air leak.  I took photos of the anastomosis.  I then withdrew the scope into the esophagus.  This was also unremarkable.  This is an unremarkable post bypass pouch.  Dr. Daphine Deutscher will dictate the remainder of laparoscopic Roux-en-Y gastric bypass operation.   Sandria Bales. Ezzard Standing, M.D., FACS    DHN/MEDQ  D:  10/10/2010  T:  10/10/2010  Job:  [id number]  Electronically Signed by Ovidio Kin M.D. on 10/18/2010 12:24:33 PM

## 2010-10-19 NOTE — Discharge Summary (Signed)
  NAMEMARICO, BUCKLE                   ACCOUNT NO.:  [id number]  MEDICAL RECORD NO.:  [id number]  LOCATION:  1222                         FACILITY:  [facility]  PHYSICIAN:  Thornton Park. Daphine Deutscher, MD  DATE OF BIRTH:  19-Jan-1951  DATE OF ADMISSION:  10/10/2010 DATE OF DISCHARGE:  10/13/2010                              DISCHARGE SUMMARY   ADMITTING DIAGNOSIS:  Morbid obesity with diabetes.  PROCEDURE:  October 10, 2010, laparoscopic Roux-en-Y gastric bypass with 40 cm BP limb, 100 cm Roux limb.  COURSE IN HOSPITAL:  Shafter Jupin is a 60 year old white male who underwent the above-mentioned procedure.  Postoperatively, he did well.  He did have a small sliding hiatal hernia that we repaired posteriorly at the time of the case.  He did well.  He went to the step-down unit postop where he stayed for his 3-day stay.  He was, however, taking liquids fine at the time of discharge.  He was given Roxicet elixir for pain. Appointment was made for him to see me in the office on October 12th. Condition improved.     Thornton Park Daphine Deutscher, MD     MBM/MEDQ  D:  10/13/2010  T:  10/13/2010  Job:  [id number]  Electronically Signed by Luretha Murphy MD on 10/19/2010 07:30:18 AM

## 2010-10-19 NOTE — Op Note (Signed)
[name], [name]                   ACCOUNT NO.:  [id number]  MEDICAL RECORD NO.:  [id number]  LOCATION:  1222                         FACILITY:  [facility]  PHYSICIAN:  Lucas Park. Daphine Deutscher, MD  DATE OF BIRTH:  02-03-1950  DATE OF PROCEDURE:  10/10/2010 DATE OF DISCHARGE:                              OPERATIVE REPORT   PREOPERATIVE DIAGNOSES: 1. Morbid obesity, body mass index of 38. 2. Type 2 diabetes mellitus. 3. Obstructive sleep apnea. 4. Hypertension. 5. Hyperlipidemia. 6. Gastroesophageal reflux disease.  POSTOPERATIVE DIAGNOSES: 1. Morbid obesity, body mass index of 38. 2. Type 2 diabetes mellitus. 3. Obstructive sleep apnea. 4. Hypertension. 5. Hyperlipidemia. 6. Gastroesophageal reflux disease. 7. Small sliding hiatal hernia.  PROCEDURE:  Laparoscopic Roux-en-Y gastric bypass with 40-cm BP limb, 100 cm Roux limb, antecolic antegastric with candy cane to the left and closure of Petersen defect.  One suture posterior hiatal closure for sliding hernia with dissection of the herniated fat pad and the sliding hernia.  Upper endoscopy by Dr. Ezzard [name].  SURGEON:  Lucas Park. Daphine Deutscher, MD  ASSISTANT:  [name]. Lucas Standing, MD  ANESTHESIA:  Endotracheal.  DESCRIPTION OF PROCEDURE:  This 60 year old white male was taken into OR 1, on Monday, October 10, 2010, given general anesthesia.  The abdomen was prepped with PCMX and draped sterilely.  Time-out was performed. Access to the abdomen was achieved through the left upper quadrant with a 0-degree Optiview without difficulty.  The abdomen was insufflated. Standard trocar placements were made initially.  He did become a little bradycardic, and we let off the pneumo for about 3 to 4 minutes, and after atropine, his venous pulse returned to normal.  I eventually had an extra trocar going up higher because he did have a very large abdomen and his foregut was pretty high.  In the meantime, we identified ligament of Treitz.  Prior to  doing that, I did look at his gallbladder we did pick up some gallstones on his preoperative workup and this did not appear to be an issue and because of his size and noted comorbidities, I did not wish to try to do a laparoscopic cholecystectomy in addition to his bypass.  The ligament of Treitz was identified and measured 40 cm, where I divided it with a new Covidien stapler with a tan load.  I opened up his mesentery little bit more with a harmonic.  I then sutured the Penrose to the Roux limb and measured at 1 m x 4 cm at a time and then placed it side-by-side to the BP limb. This was held in place with a stay suture.  Openings were made on the antimesenteric borders and a 4.5 cm tan load was used to create this anastomosis.  The common defect was closed from either end with 2-0 Vicryl, making a nice secure closure.  Tisseel was then applied. Mesenteric defect was closed with running 2-0 silk with tie knots.  This apparently achieved closure of the mesenteric defect, but also kind of created an anti-obstruction stitch.  The omentum was then divided.  He had a very generous omentum.  This was taken up to the colon.  Next, the [facility]  retractor was placed in the upper abdomen retracting left lateral segment.  He had an easily visible dimple and a hiatal hernia.  I elected to incise the anterior reflection and reduced that dimple.  I then dissected posteriorly and saw the right and left crura and placed a single stitch posteriorly to approximate the crura.  With this reduced, I then measured 5 cm along the lesser curve, came in and divided the stomach at that level.  Again using a purple load, sequential purple applications were used with placement of the Ewald tube as a guide to the EG junction.  On the first firing, it looked like there may be a lipoma in the anterior wall of the stomach, and this was proven to only open the stomach from the anastomosis that appeared to be a  little lipoma, which we left undisturbed.  After the pouch was created, we sewed a back wall of the Roux limb with candy cane to the left 2-0 Vicryl.  Openings were made along the right side with a harmonic scalpel and a 4.5 cm stapler was applied again using the purple load.  This was fired and we looked inside and it was hemostatic.  The little lipoma was noted.  This opening was closed from either end with running 2-0 Vicryl and got good closure there.  Ewald tube was passed across the anastomosis and we did a running 2-0 Vicryl closure over that with a free needle and tie knots at either end.  Next, we closed Petersen defect with figure-of-8 suture and 2-0 silk to the Roux limb side on the left.  Next, I clamped off the outflow tract. Dr. Ezzard [name] endoscoped the patient.  No evidence of leak.  No bleeding was seen on the inside and the anastomosis was 5 to 6 cm below the GE junction.  The incisions were all injected with a explorer and closed with 4-0 Vicryl and staples.  The patient tolerated procedure well.  He was taken to recovery room in satisfactory condition.     Lucas Park Daphine Deutscher, MD     MBM/MEDQ  D:  10/10/2010  T:  10/10/2010  Job:  [id number]  cc:   [name], M.D. Fax: [phone]  Electronically Signed by Lucas Murphy MD on 10/19/2010 07:30:15 AM

## 2010-10-25 ENCOUNTER — Encounter: Payer: BC Managed Care – PPO | Attending: Surgery

## 2010-10-25 DIAGNOSIS — Z01818 Encounter for other preprocedural examination: Secondary | ICD-10-CM | POA: Insufficient documentation

## 2010-10-25 DIAGNOSIS — Z713 Dietary counseling and surveillance: Secondary | ICD-10-CM | POA: Insufficient documentation

## 2010-10-25 NOTE — Patient Instructions (Signed)
Patient to follow Phase 3A-Soft, High Protein Diet and follow-up at [facility] in 6 weeks for 2 months post-op nutrition visit for diet advancement. 

## 2010-10-25 NOTE — Progress Notes (Signed)
  2 Week Post-Operative Nutrition Class  Patient was seen on 10/25/2010 for Post-Operative Nutrition education at [facility].   Surgery date: 10/10/10 Surgery type: Gastric Bypass  Weight today: 322.8 lbs Weight change: 35.9 lbs BMI: 35.5%  The following the learning objective met the patient during this course:   Identifies Phase 3A (Soft, High Proteins) Dietary Goals and will begin from 2 weeks post-operatively to 2 months post-operatively   Identifies appropriate sources of fluids and proteins   States protein recommendations and appropriate sources post-operatively  Identifies the need for appropriate texture modifications, mastication, and bite sizes when consuming solids  Identifies appropriate multivitamin and calcium sources post-operatively  Describes the need for physical activity post-operatively and will follow MD recommendations  States when to call healthcare provider regarding medication questions or post-operative complications  Handouts given during class include:  Phase 3A: Soft, High Protein Diet Handout  Follow-Up Plan: Patient will follow-up at [facility] in 6 weeks for 2 months post-op nutrition visit for diet advancement per MD.

## 2010-10-26 ENCOUNTER — Telehealth (INDEPENDENT_AMBULATORY_CARE_PROVIDER_SITE_OTHER): Payer: Self-pay

## 2010-10-26 NOTE — Telephone Encounter (Signed)
Pt called complaining of indigestion for about 2 days.  He had Gastric bypass on 9/17.  Today was his 1st solid food. He had tuna.  He is not vomiting.  I advised he could try Tums or Maalox for the indigestion.  He said he was on Prilosec preop and needs to restart that.  I told him that would be good.  He would call if no better.

## 2010-11-04 ENCOUNTER — Encounter (INDEPENDENT_AMBULATORY_CARE_PROVIDER_SITE_OTHER): Payer: Self-pay | Admitting: Surgery

## 2010-11-04 ENCOUNTER — Ambulatory Visit (INDEPENDENT_AMBULATORY_CARE_PROVIDER_SITE_OTHER): Payer: BC Managed Care – PPO | Admitting: Surgery

## 2010-11-04 VITALS — BP 134/72 | HR 72 | Temp 97.2°F | Resp 16

## 2010-11-04 DIAGNOSIS — Z9884 Bariatric surgery status: Secondary | ICD-10-CM

## 2010-11-04 NOTE — Progress Notes (Signed)
[name] and his wife came in today for a postop visit. He is 25 days after Roux-en-Y gastric bypass. His weight is 316.8 and he has lost 28 pounds. More importantly Dr. Valentina Lucks has significantly reduced his diabetes oral medications. He looks good and his incisions are healing nicely. Today his BMI is 34.7. I plan to see him again in 2 months.

## 2010-11-17 ENCOUNTER — Telehealth (INDEPENDENT_AMBULATORY_CARE_PROVIDER_SITE_OTHER): Payer: Self-pay

## 2010-11-17 NOTE — Telephone Encounter (Signed)
Pt called requesting advise on how to handle constipation. Pt has been using miralax with good results until this week. Pt passing flatus but mirlax has not helped him have a bm in the last few days. No abd pain. No nausea or vomiting. Please review and call pt with recommedations.

## 2010-11-18 ENCOUNTER — Telehealth (INDEPENDENT_AMBULATORY_CARE_PROVIDER_SITE_OTHER): Payer: Self-pay

## 2010-11-18 ENCOUNTER — Telehealth: Payer: Self-pay | Admitting: Internal Medicine

## 2010-11-18 MED ORDER — METHYLPHENIDATE HCL 10 MG PO TABS: 10.0000 mg | ORAL_TABLET | Freq: Two times a day (BID) | ORAL | Status: DC

## 2010-11-18 NOTE — Telephone Encounter (Signed)
Fine to refill Ritalin routinely - Thanks.

## 2010-11-18 NOTE — Telephone Encounter (Signed)
rx signed by CY and pt is aware to pick this up at the front desk.

## 2010-11-18 NOTE — Telephone Encounter (Signed)
Pt is requesting to pick up rx for ritalin 10 mg.  States his blood sugars have been running good since surgery and switching to this strength.  His last OV with CDY 08/22/10 and was told to f/u in 1 year.  Rx was last written on 10/04/10 #60 x 0.  Dr. Maple Hudson, per previous phone message from 10/03/10, you had rec we give pt the ritalin 10 mg rx for 1 twice daily prn.  It is on pt's current med list as 1 bid.  Pt states he did start out taking the ritalin once daily after surgery, but this did not seem to work.  He increased it to 1 bid which now seems to be working.  Pls clarify on sig so we can print rx for pt to pick up.  Thank you!

## 2010-11-18 NOTE — Telephone Encounter (Signed)
Returned pt's call after speaking with Dr Andrey Campanile about the pt taking some laxatives. Per Dr Andrey Campanile ok for the pt to take Milk of Magnesia along with the Miralax. The pt understands./ AHS

## 2010-11-18 NOTE — Telephone Encounter (Signed)
Pt calling into office today b/c still not having a good response to taking the Miralax with stool softners. The pt started having some gas along with a little BM this am. I told the pt that I would speak to one of the bariatric docotors here b/c Dr Daphine Deutscher was not available./ AHS

## 2010-11-18 NOTE — Telephone Encounter (Signed)
Rx printed and placed on CDY's for signature.

## 2010-12-06 ENCOUNTER — Encounter: Payer: Self-pay | Admitting: *Deleted

## 2010-12-06 ENCOUNTER — Encounter: Payer: BC Managed Care – PPO | Attending: Surgery | Admitting: *Deleted

## 2010-12-06 DIAGNOSIS — Z713 Dietary counseling and surveillance: Secondary | ICD-10-CM | POA: Insufficient documentation

## 2010-12-06 DIAGNOSIS — Z01818 Encounter for other preprocedural examination: Secondary | ICD-10-CM | POA: Insufficient documentation

## 2010-12-06 NOTE — Patient Instructions (Signed)
Goals:  Follow Phase 3B: High Protein + Non-Starchy Vegetables  Eat 3-6 small meals/snacks, every 3-5 hrs  Increase lean protein foods to meet 80-100g goal  Increase fluid intake to 64oz +  Avoid drinking 15 minutes before, during and 30 minutes after eating  Aim for >30 min of physical activity daily  

## 2010-12-06 NOTE — Progress Notes (Signed)
  Follow-up visit: 8 Weeks Post-Operative Gastric Bypass Surgery  Medical Nutrition Therapy:  Appt start time: 1500 end time:  1530.  Assessment:  Primary concerns today: post-operative bariatric surgery nutrition management. [name] is doing very well with nutrition. No reported nutrition related problems.  Weight today: 293.7 lbs Weight change: 29.1lbs Total weight lost: 65 lbs BMI: 32.3%  Surgery date: 10/10/10   Start weight at [facility]: 358.7 lbs  24-hr recall: See attached food log in MEDIA TAB  Fluid intake: 64 ox Estimated total protein intake: 60-80g  Medications: See updated medication list Supplementation: Taking regularly  CBG monitoring: Daily  Average CBG per patient: 117- 120 Last patient reported A1c: 5.9% per pt (by PCP)  Lab Results  Component Value Date   HGBA1C 7.5* 10/10/2010   Using straws: No Drinking while eating: No Hair loss: No Carbonated beverages: No N/V/D/C: No Dumping syndrome: No  Recent physical activity:  Exercising 3 times/week at [facility] exercise program and walking 30-60 minutes 2-3 times/week  Progress Towards Goal(s):  In progress.  Handouts given during visit include:  Phase 3B - High Protein + Non-starchy vegetables  Protein Bar Recommendations   Nutritional Diagnosis:  NI-5.7.1 Inadequate protein intake As related to small portions and variable appetite s/p Gastric Bypass surgery.  As evidenced by pt consuming 50-75% of estimated protein intake.    Intervention:  Nutrition education.  Monitoring/Evaluation:  Dietary intake, exercise, lap band fills, and body weight. Follow up in 6-8 weeks for 3-4 month post-op visit.

## 2010-12-23 ENCOUNTER — Ambulatory Visit (INDEPENDENT_AMBULATORY_CARE_PROVIDER_SITE_OTHER): Payer: BC Managed Care – PPO | Admitting: Surgery

## 2010-12-26 ENCOUNTER — Telehealth: Payer: Self-pay | Admitting: Internal Medicine

## 2010-12-26 MED ORDER — METHYLPHENIDATE HCL 10 MG PO TABS: 10.0000 mg | ORAL_TABLET | Freq: Two times a day (BID) | ORAL | Status: DC

## 2010-12-26 NOTE — Telephone Encounter (Signed)
Last saw Cy 07/26/10 and has 1 year f/u appt scheduled for 07/26/11.  Last given rx for Ritalin 10mg  on 11/18/10 for # 60 x 0 refills.  Printed rx and put on CY's cart for him to sign.

## 2010-12-26 NOTE — Telephone Encounter (Signed)
Pt is aware that Rx at front for pick up.

## 2011-01-12 ENCOUNTER — Telehealth: Payer: Self-pay | Admitting: Internal Medicine

## 2011-01-12 NOTE — Telephone Encounter (Signed)
For dried out feeling, probably aggravated by winter heat- he can turn up the humidifier on his CPAP, and he can try otc Biotene for dry mouth.  After significant weight loss- Start by noticing if the mask is leaking and he has to strap it on tighter as the contours of his face change.                                               If the pressure seems too high, or he is getting very gassey from swallowed air, then I will have the pressure reassessed.

## 2011-01-12 NOTE — Telephone Encounter (Signed)
Spoke with pt. He states that 13 wks ago he had gastric bypass surgery and has lost 90 lbs. He states that since wt loss he has noticed feeling "dried out" when he wakes up in the am, and sometimes wakes up in the night with dry mouth. Wants to know if CPAP needs to be adjusted, or wait until he loses more wt? Please advise, thanks!

## 2011-01-13 NOTE — Telephone Encounter (Signed)
Pt is aware of CDY recs and will contact the office if he has any further questions or problems continue.

## 2011-01-20 ENCOUNTER — Ambulatory Visit (INDEPENDENT_AMBULATORY_CARE_PROVIDER_SITE_OTHER): Payer: BC Managed Care – PPO | Admitting: Surgery

## 2011-01-20 ENCOUNTER — Encounter (INDEPENDENT_AMBULATORY_CARE_PROVIDER_SITE_OTHER): Payer: Self-pay | Admitting: Surgery

## 2011-01-20 VITALS — BP 142/70 | HR 64 | Temp 97.8°F | Resp 16 | Ht >= 80 in | Wt 265.5 lb

## 2011-01-20 DIAGNOSIS — E669 Obesity, unspecified: Secondary | ICD-10-CM

## 2011-01-20 NOTE — Progress Notes (Signed)
[name] comes in today in is 3.4 months out from his surgery. He is lost 78.8 pounds to a BMI of 29. More importantly he is off all of his medicines for diabetes his hemoglobin A1c is down to 5.9. His obstructive sleep apnea is better and he can have to work on getting a CPAP think care of. I plan to see him again in 6 months in routine followup.

## 2011-01-30 ENCOUNTER — Encounter: Payer: BC Managed Care – PPO | Attending: Surgery | Admitting: *Deleted

## 2011-01-30 DIAGNOSIS — Z01818 Encounter for other preprocedural examination: Secondary | ICD-10-CM | POA: Insufficient documentation

## 2011-01-30 DIAGNOSIS — Z713 Dietary counseling and surveillance: Secondary | ICD-10-CM | POA: Insufficient documentation

## 2011-01-30 NOTE — Patient Instructions (Signed)
Goals:  Follow Phase 3B: High Protein + Non-Starchy Vegetables  Eat 3-6 small meals/snacks, every 3-5 hrs  Increase lean protein foods to meet 100g goal  Increase fluid intake to 64oz +  Add ~15 grams of high fiber carbohydrate (fruit, whole grain, starchy vegetable) with meals  Avoid drinking 15 minutes before, during and 30 minutes after eating  Aim for >30 min of physical activity daily

## 2011-01-30 NOTE — Progress Notes (Signed)
  Follow-up visit: 16 Weeks Post-Operative Gastric Bypass Surgery  Medical Nutrition Therapy:  Appt start time: 1600 end time:  1630.  Assessment:  Primary concerns today: post-operative bariatric surgery nutrition management.  Weight today: 264.1 lbs Weight change: 29.6 lbs Total weight lost: 94.6 lbs  BMI: 29.2 Weight goal: 225 lbs   Surgery date: 10/10/10  Start weight at [facility]: 358.7 lbs  24-hr recall: Pt and his wife bring in all food log sheets. See scanned sheets under MEDIA TAB   Fluid intake: 64-72 oz (all sugar-free) Estimated total protein intake: 45-75g per day  Medications: Off all DM Medications Supplementation: No problems reported; Taking supplements regularly  CBG monitoring: BID Average CBG per patient: 80-115 Last patient reported A1c: No recent A1c level  Using straws: No Drinking while eating: No Hair loss: No Carbonated beverages: No N/V/D/C: Constipation, daily (Using Daily Chewable Probiotic) Dumping syndrome: None  Recent physical activity:  BELT program (3 times/week - 60 minutes)  Progress Towards Goal(s):  Some progress.  Handouts given during visit include:  Fiber food content   Nutritional Diagnosis:  NI-5.7.1 Inadequate protein intake As related to related to small portions of protein rich food.  As evidenced by pt meeting ~50% of estimated protein needs.    Intervention:  Nutrition education.  Monitoring/Evaluation:  Dietary intake, exercise, lap band fills, and body weight. Follow up in 3 months for 6-7 month post-op visit.

## 2011-01-31 ENCOUNTER — Telehealth: Payer: Self-pay | Admitting: Internal Medicine

## 2011-01-31 MED ORDER — METHYLPHENIDATE HCL 10 MG PO TABS: 10.0000 mg | ORAL_TABLET | Freq: Two times a day (BID) | ORAL | Status: DC

## 2011-01-31 NOTE — Telephone Encounter (Signed)
RX has been printed and placed on cdy cart for signature. Please advise Dr. Maple Hudson, thanks

## 2011-02-02 NOTE — Telephone Encounter (Signed)
Per Florentina Addison this has been taking care of. WIll sign off

## 2011-03-01 ENCOUNTER — Ambulatory Visit (INDEPENDENT_AMBULATORY_CARE_PROVIDER_SITE_OTHER): Payer: BC Managed Care – PPO | Admitting: Internal Medicine

## 2011-03-01 ENCOUNTER — Encounter: Payer: Self-pay | Admitting: Internal Medicine

## 2011-03-01 VITALS — BP 130/82 | HR 59 | Ht >= 80 in | Wt 250.4 lb

## 2011-03-01 DIAGNOSIS — G4733 Obstructive sleep apnea (adult) (pediatric): Secondary | ICD-10-CM

## 2011-03-01 NOTE — Patient Instructions (Signed)
Order- Christoper Allegra- reduce CPAP to 5 cwp                     Dx OSA                       - replacement CPAP mask of choice and supplies

## 2011-03-01 NOTE — Progress Notes (Signed)
Patient ID: ZYHIR CAPPELLA, male    DOB: 1950-12-30, 61 y.o.   MRN: [id number]  HPI 07/26/10- 43 yoM never smoker, followed for OSA with hypersomnia, obesity, complicated by DM, CAD/ Hx MI, GERD, hypothyroid. Last here July 27, 2009 - note reviewed He is using Ritalin now, after some availability problems last year. Takes 1x Ritalin 20 mg SR once daily, every day. Effective and well tolerated.  CPAP all night every night with no problems, from [facility] still at 10 cwp.  Uses Sonata 10 mg only once or twice per month, or maybe an Aleve PM. He is being evaluated for gastric bypass bariatric surgery with Dr Daphine Deutscher. We discussed CPAP adjustments that may be appropriate with weight gain, and also issues of post-op safety and sleep apnea.   03/01/11-  60 yoM never smoker, followed for OSA with hypersomnia, obesity, complicated by DM, CAD/ Hx MI, GERD, hypothyroid...wife here Has had bariatric surgery, lost 100 lbs. CPAP not worn in 2 weeks- pressure too high and overdrying him.  His goal weight is 225 pounds. We discussed how this weight loss would affect and even possibly remove his need for CPAP. It will also affect best fit on his mask.  ROS-see HPI Constitutional:   +  weight loss,  No-night sweats, fevers, chills, fatigue, lassitude. HEENT:   No-  headaches, difficulty swallowing, tooth/dental problems, sore throat,       No-  sneezing, itching, ear ache, nasal congestion, post nasal drip,  CV:  No-   chest pain, orthopnea, PND, swelling in lower extremities, anasarca, dizziness, palpitations Resp: No-   shortness of breath with exertion or at rest.              No-   productive cough,  No non-productive cough,  No- coughing up of blood.           Skin: No-   rash or lesions. GI:  No-   heartburn, indigestion, abdominal pain, nausea, vomiting, diarrhea,                 change in bowel habits, loss of appetite GU:  MS:  No-   joint pain or swelling.  No- decreased range of motion.  No- back pain. Neuro-      nothing unusual Psych:  No- change in mood or affect. No depression or anxiety.  No memory loss.      Objective:  OBJ- Physical Exam General- Alert, Oriented, Affect-appropriate, Distress- none acute, obviously much thinner Skin- rash-none, lesions- none, excoriation- none Lymphadenopathy- none Head- atraumatic            Eyes- Gross vision intact, PERRLA, conjunctivae and secretions clear            Ears- Hearing, canals-normal            Nose- Clear, no-Septal dev, mucus, polyps, erosion, perforation             Throat- Mallampati II , mucosa clear , drainage- none, tonsils- atrophic Neck- flexible , trachea midline, no stridor , thyroid nl, carotid no bruit Chest - symmetrical excursion , unlabored           Heart/CV- RRR , no murmur , no gallop  , no rub, nl s1 s2                           - JVD- none , edema- none, stasis changes- none, varices- none  Lung- clear to P&A, wheeze- none, cough- none , dullness-none, rub- none           Chest wall-  Abd- Br/ Gen/ Rectal- Not done, not indicated Extrem- cyanosis- none, clubbing, none, atrophy- none, strength- nl Neuro- grossly intact to observation

## 2011-03-04 NOTE — Assessment & Plan Note (Signed)
With such significant weight loss, he may no longer need CPAP. His wife thinks he still snores without it. We will try empiric reduction to 5 CWP. Reduced airflow should help with his complaint of dryness.

## 2011-03-09 ENCOUNTER — Telehealth: Payer: Self-pay | Admitting: Internal Medicine

## 2011-03-09 MED ORDER — METHYLPHENIDATE HCL 10 MG PO TABS: 10.0000 mg | ORAL_TABLET | Freq: Two times a day (BID) | ORAL | Status: DC

## 2011-03-09 NOTE — Telephone Encounter (Signed)
rx signed by CDY.  Called spoke with pt and informed him that his rx is ready to picked up at his convenience.  Pt verbalized his understanding.  rx placed up front in brown file.

## 2011-03-09 NOTE — Telephone Encounter (Signed)
Rx has been printed and placed on CDY cart for signature

## 2011-03-15 ENCOUNTER — Telehealth: Payer: Self-pay | Admitting: Internal Medicine

## 2011-03-15 DIAGNOSIS — G4733 Obstructive sleep apnea (adult) (pediatric): Secondary | ICD-10-CM

## 2011-03-15 NOTE — Telephone Encounter (Signed)
Spoke with patient-aware that I will need to run this by CY-he will be back in the office tomorrow morning. Will inform patient of decision asap.

## 2011-03-15 NOTE — Telephone Encounter (Signed)
Since his wife told us he still snores, I recommend a new sleep study.  Please order split protocol NPSG for Dx OSA

## 2011-03-16 NOTE — Telephone Encounter (Signed)
Pt aware that CY agrees for him to have Sleep Study repeated. I have placed order and pt aware that our PCC's will call with time and date of study.

## 2011-03-29 ENCOUNTER — Ambulatory Visit (HOSPITAL_BASED_OUTPATIENT_CLINIC_OR_DEPARTMENT_OTHER): Payer: BC Managed Care – PPO | Attending: Internal Medicine | Admitting: Radiology

## 2011-03-29 DIAGNOSIS — G4710 Hypersomnia, unspecified: Secondary | ICD-10-CM | POA: Insufficient documentation

## 2011-03-29 DIAGNOSIS — G4730 Sleep apnea, unspecified: Secondary | ICD-10-CM | POA: Insufficient documentation

## 2011-03-29 DIAGNOSIS — G4733 Obstructive sleep apnea (adult) (pediatric): Secondary | ICD-10-CM

## 2011-03-29 DIAGNOSIS — G4761 Periodic limb movement disorder: Secondary | ICD-10-CM | POA: Insufficient documentation

## 2011-04-02 DIAGNOSIS — G4710 Hypersomnia, unspecified: Secondary | ICD-10-CM

## 2011-04-02 DIAGNOSIS — G4730 Sleep apnea, unspecified: Secondary | ICD-10-CM

## 2011-04-02 DIAGNOSIS — G4761 Periodic limb movement disorder: Secondary | ICD-10-CM

## 2011-04-03 NOTE — Procedures (Signed)
[name], [name]                   ACCOUNT NO.:  [id number]  MEDICAL RECORD NO.:  [id number]          PATIENT TYPE:  OUT  LOCATION:  SLEEP CENTER                 FACILITY:  [facility]  PHYSICIAN:  Kelyn Koskela D. Maple Hudson, MD, FCCP, FACPDATE OF BIRTH:  December 25, 1950  DATE OF STUDY:  03/29/2011                           NOCTURNAL POLYSOMNOGRAM  REFERRING PHYSICIAN:  Broughton Eppinger D. Alma Mohiuddin, MD, FCCP, FACP  INDICATION FOR STUDY:  Hypersomnia with sleep apnea.  EPWORTH SLEEPINESS SCORE:  6/24.  BMI 25.9, weight 236 pounds, height 80 inches, neck 16 inches.  HOME MEDICATIONS:  Charted and reviewed.  SLEEP ARCHITECTURE:  Total sleep time 285 minutes with sleep efficiency 72.1%.  Stage I was 8.9%, stage II 77.2%, stage III absent, REM 13.9% of total sleep time.  Sleep latency 6.5 minutes, REM latency 277.5 minutes. Awake after sleep onset 102 minutes.  Arousal index 18.1.  Bedtime medication:  None.  RESPIRATORY DATA:  Apnea hypopnea index (AHI) 3.2 per hour.  A total of 15 events was scored including 4 obstructive apneas and 11 hypopneas. Events were more common while supine.  REM AHI 1.5 per hour.  There were insufficient numbers of events to permit application of split protocol, CPAP titration on this study night.  OXYGEN DATA:  Snoring absent to mild with oxygen desaturation to a nadir of 88% and mean oxygen saturation through the study of 93.5% on room air.  A total of 1 minute was spent with oxygen saturation less than 90% during the total recording.  CARDIAC DATA:  Sinus rhythm with PVCs.  MOVEMENT/PARASOMNIA:  Periodic limb movement with arousal.  A total of 380 limb jerks were counted, of which 34 were associated with arousal or awakening for periodic limb movement with arousal index of 7.2 per hour. No bathroom trips.  IMPRESSION/RECOMMENDATION: 1. Occasional respiratory events with sleep disturbance, within normal     limits.  AHI 3.2 per hour (the normal range for adults is from 0-5  events per hour).  Snoring was absent to mild with oxygen     desaturation to a nadir of 88% and mean oxygen saturation through     the study of 93.5% on room air. 2. Periodic limb movement with arousal syndrome.  Total of 380 limb     jerks were counted, of which 34 were     associated with arousal or awakening for periodic limb movement     with arousal index of 7.2 per hour.  Consider specific therapeutic     intervention if appropriate.     Henning Ehle D. Maple Hudson, MD, [facility], FACP Diplomate, American Board of Sleep Medicine    CDY/MEDQ  D:  04/02/2011 09:14:44  T:  04/03/2011 01:10:08  Job:  [id number]

## 2011-04-17 ENCOUNTER — Telehealth: Payer: Self-pay | Admitting: Internal Medicine

## 2011-04-17 MED ORDER — METHYLPHENIDATE HCL 10 MG PO TABS: 10.0000 mg | ORAL_TABLET | Freq: Two times a day (BID) | ORAL | Status: DC

## 2011-04-17 NOTE — Telephone Encounter (Signed)
CY- Please sign rx for Ritalin for pt to pick up  .  Pt also requesting sleep study results.  Results are in EMR.

## 2011-04-17 NOTE — Telephone Encounter (Signed)
Sleep study showed apnea events within normal limits- only 3.2 per hour. Congratulations!  The study did show a lot of limb jerks. This can be treated with medication if it disturbs his sleep enough to bother with.   Refill script for ritalin is ready.

## 2011-04-17 NOTE — Telephone Encounter (Signed)
Pt aware of results of sleep study and that RX is at front for pick up.

## 2011-05-01 ENCOUNTER — Encounter: Payer: BC Managed Care – PPO | Attending: Surgery | Admitting: *Deleted

## 2011-05-01 VITALS — Ht >= 80 in | Wt 231.2 lb

## 2011-05-01 DIAGNOSIS — Z713 Dietary counseling and surveillance: Secondary | ICD-10-CM | POA: Insufficient documentation

## 2011-05-01 DIAGNOSIS — Z01818 Encounter for other preprocedural examination: Secondary | ICD-10-CM | POA: Insufficient documentation

## 2011-05-01 DIAGNOSIS — E669 Obesity, unspecified: Secondary | ICD-10-CM

## 2011-05-01 NOTE — Progress Notes (Signed)
  Follow-up visit: 16 Weeks Post-Operative Gastric Bypass Surgery  Medical Nutrition Therapy:  Appt start time: 1600 end time:  1630.  Assessment:  Primary concerns today: post-operative bariatric surgery nutrition management.  Weight today: 231.2 lbs Weight change: 32.9 lbs Total weight lost: 127.5 lbs  BMI: 25.4% Weight goal: 225 lbs  % Weight goal met: 97%  Surgery date: 10/10/10  Start weight at [facility]: 358.7 lbs  24-hr recall: Pt and his wife bring in all food and fluid log sheets. See scanned sheets under MEDIA TAB   Fluid intake: 60-85 oz (all sugar-free) Estimated total protein intake: 55-85g per day  Medications: No changes from last visit. Supplementation: No problems reported; Taking supplements regularly  CBG monitoring: 2 times/week Average CBG per patient: 105-115 Last patient reported A1c: No recent A1c level  Using straws: No Drinking while eating: No Hair loss: No Carbonated beverages: No N/V/D/C: No constipation d/t new chewable probiotic from [employer] Dumping syndrome: None  Recent physical activity:  3 days/wk [employer]; walks the other 3 days @ 45-60 min/ea  Progress Towards Goal(s):  Resolved.  Nutritional Diagnosis:  No nutrition diagnosis at this time.    Intervention:  Nutrition education/reinforcement.  Monitoring/Evaluation:  Dietary intake, exercise, and body weight in 6 months for 18 month post-op visit or prn.

## 2011-05-01 NOTE — Patient Instructions (Signed)
Goals:  Add protein to all snacks (including those with fruit).  Add 1/2 cup of starchy vegetables one time a day to start. Choose higher fiber options.   Continue previous goals set by Royal Hawthorn.

## 2011-05-02 ENCOUNTER — Encounter: Payer: Self-pay | Admitting: *Deleted

## 2011-05-24 ENCOUNTER — Telehealth: Payer: Self-pay | Admitting: Internal Medicine

## 2011-05-24 MED ORDER — METHYLPHENIDATE HCL 10 MG PO TABS: 10.0000 mg | ORAL_TABLET | Freq: Two times a day (BID) | ORAL | Status: DC

## 2011-05-24 NOTE — Telephone Encounter (Signed)
Rx placed at front.

## 2011-05-24 NOTE — Telephone Encounter (Signed)
I spoke with pt and is requesting to pick up rx for rtialin 10 mg. I advised will print out rx and have CDY sign this and will placed upfront for pick up. He voiced his understanding and needed nothing further. Please advise CDY thanks

## 2011-06-12 ENCOUNTER — Telehealth: Payer: Self-pay | Admitting: Internal Medicine

## 2011-06-12 MED ORDER — METHYLPHENIDATE HCL 10 MG PO TABS: 10.0000 mg | ORAL_TABLET | Freq: Two times a day (BID) | ORAL | Status: DC

## 2011-06-12 NOTE — Telephone Encounter (Signed)
PT AWARE AN WILL PICK UP RX TOMORROW

## 2011-06-12 NOTE — Telephone Encounter (Signed)
Spoke with pt. He states needing rx for ritalin 10 mg bid- will pick up when ready.  He states going out of town and this is why it is a little early he is needing med.  Rx placed on CDY's cart to sign

## 2011-06-16 ENCOUNTER — Telehealth: Payer: Self-pay | Admitting: Internal Medicine

## 2011-06-16 NOTE — Telephone Encounter (Signed)
Dr Young please advise thanks 

## 2011-06-16 NOTE — Telephone Encounter (Signed)
Called and spoke with sara at [facility]---she is aware that per CY ok to refill the ritalin early.  Nothing further was needed.

## 2011-06-16 NOTE — Telephone Encounter (Signed)
Ok to refill early  

## 2011-07-26 ENCOUNTER — Ambulatory Visit: Payer: BC Managed Care – PPO | Admitting: Internal Medicine

## 2011-07-26 ENCOUNTER — Encounter (INDEPENDENT_AMBULATORY_CARE_PROVIDER_SITE_OTHER): Payer: Self-pay | Admitting: Surgery

## 2011-07-26 ENCOUNTER — Telehealth: Payer: Self-pay | Admitting: Internal Medicine

## 2011-07-26 ENCOUNTER — Ambulatory Visit (INDEPENDENT_AMBULATORY_CARE_PROVIDER_SITE_OTHER): Payer: BC Managed Care – PPO | Admitting: Surgery

## 2011-07-26 VITALS — BP 118/76 | HR 56 | Temp 97.8°F | Resp 14 | Ht >= 80 in | Wt 224.0 lb

## 2011-07-26 DIAGNOSIS — Z9884 Bariatric surgery status: Secondary | ICD-10-CM

## 2011-07-26 MED ORDER — METHYLPHENIDATE HCL 10 MG PO TABS: 10.0000 mg | ORAL_TABLET | Freq: Two times a day (BID) | ORAL | Status: DC

## 2011-07-26 NOTE — Progress Notes (Signed)
[name] 61 y.o.  Body mass index is 24.61 kg/(m^2).  Patient Active Problem List  Diagnosis  . HYPOTHYROIDISM  . DIABETES MELLITUS, TYPE II  . HYPERLIPIDEMIA, MIXED  . OBESITY  . OBSTRUCTIVE SLEEP APNEA  . HYPERTENSION  . MYOCARDIAL INFARCTION, HX OF  . GERD  . DEGENERATIVE JOINT DISEASE  . HYPERSOMNIA  . ANEMIA, IRON DEFICIENCY, HX OF    Allergies  Allergen Reactions  . Moxifloxacin     Rash  . Penicillins Other (See Comments)    Possible rash per patient's mother. Happened when he was 75 years old.    Past Surgical History  Procedure Date  . Coronary angioplasty with stent placement   . Left second toe amputation of mrsa osteomyelitis 2008  . Bariatric surgery    Lucas Mountain, MD No diagnosis found.  [name] came in today for a 9 month post operative visit. He is loss 120 pounds as far. His BMI is 24.6. He is off all of his medicines except for a little bit of Lipitor he is off of all medicines.. He spoke at our symposium and continues to be active in exercise classes etc. I will see him back in September which will be as one-year anniversary.   Matt B. Daphine Deutscher, MD, [facility] [phone] beeper [phone]  07/26/2011 2:58 PM

## 2011-07-26 NOTE — Telephone Encounter (Signed)
Last OV with Dr. Maple Hudson 03/01/11 Pending OV with Dr. Maple Hudson on 09/04/11 Ritalin 10 mg rx last written on 06/12/11 # 60 x 0.   Rx printed and placed on CDY's cart for signature  [email or web address] to see if pt wants to pick up or mailed?

## 2011-07-26 NOTE — Patient Instructions (Signed)
You are looking great at 9 months.   Return in Sept for a 1 year visit. Keep up the good work

## 2011-07-26 NOTE — Telephone Encounter (Signed)
Called and lmom to make pt aware that rx is up front and is ready to be picked up.  Pt to call back for any further questions or concerns.

## 2011-08-01 ENCOUNTER — Ambulatory Visit: Payer: BC Managed Care – PPO | Admitting: Internal Medicine

## 2011-09-04 ENCOUNTER — Encounter: Payer: Self-pay | Admitting: Internal Medicine

## 2011-09-04 ENCOUNTER — Ambulatory Visit (INDEPENDENT_AMBULATORY_CARE_PROVIDER_SITE_OTHER): Payer: BC Managed Care – PPO | Admitting: Internal Medicine

## 2011-09-04 VITALS — BP 124/80 | HR 47 | Ht >= 80 in | Wt 230.4 lb

## 2011-09-04 DIAGNOSIS — G4733 Obstructive sleep apnea (adult) (pediatric): Secondary | ICD-10-CM

## 2011-09-04 DIAGNOSIS — G4710 Hypersomnia, unspecified: Secondary | ICD-10-CM

## 2011-09-04 MED ORDER — METHYLPHENIDATE HCL 10 MG PO TABS: 10.0000 mg | ORAL_TABLET | Freq: Two times a day (BID) | ORAL | Status: DC

## 2011-09-04 MED ORDER — ZALEPLON 10 MG PO CAPS: 10.0000 mg | ORAL_CAPSULE | Freq: Every evening | ORAL | Status: DC | PRN

## 2011-09-04 NOTE — Progress Notes (Signed)
Patient ID: [name], male    DOB: 09-Mar-1950, 61 y.o.   MRN: [id number]  HPI 07/26/10- 66 yoM never smoker, followed for OSA with hypersomnia, obesity, complicated by DM, CAD/ Hx MI, GERD, hypothyroid. Last here July 27, 2009 - note reviewed He is using Ritalin now, after some availability problems last year. Takes 1x Ritalin 20 mg SR once daily, every day. Effective and well tolerated.  CPAP all night every night with no problems, from [employer] still at 10 cwp.  Uses Sonata 10 mg only once or twice per month, or maybe an Aleve PM. He is being evaluated for gastric bypass bariatric surgery with Dr Daphine Deutscher. We discussed CPAP adjustments that may be appropriate with weight gain, and also issues of post-op safety and sleep apnea.   03/01/11-  60 yoM never smoker, followed for OSA with hypersomnia, obesity, complicated by DM, CAD/ Hx MI, GERD, hypothyroid...wife here Has had bariatric surgery, lost 100 lbs. CPAP not worn in 2 weeks- pressure too high and overdrying him.  His goal weight is 225 pounds. We discussed how this weight loss would affect and even possibly remove his need for CPAP. It will also affect best fit on his mask.  09/04/11- 60 yoM never smoker, followed for  Hx of OSA with hypersomnia before bariatric surgery , complicated by DM, CAD/ Hx MI, GERD, hypothyroid...wife here Not using CPAP anymore-states sleep test stated pt didnt need CPAP anymore.  NPSG 03/29/11- AHI 3.2/ hr- dramatically improved after weight loss to 230 lbs now.  Occasionally wakes up early with "busy brain". No problem with sleep onset.Occasional Sonata or Advil PM. Still takes Ritalin 10 mg each morning, and occasionally again after lunch.  ROS-see HPI Constitutional:   +  weight loss= bariatric surgery,  No-night sweats, fevers, chills, fatigue, lassitude. HEENT:   No-  headaches, difficulty swallowing, tooth/dental problems, sore throat,       No-  sneezing, itching, ear ache, nasal congestion, post nasal drip,  CV:   No-   chest pain, orthopnea, PND, swelling in lower extremities, anasarca, dizziness, palpitations Resp: No-   shortness of breath with exertion or at rest.              No-   productive cough,  No non-productive cough,  No- coughing up of blood.           Skin: No-   rash or lesions. GI:  No-   heartburn, indigestion, abdominal pain, nausea, vomiting,  GU:  MS:  No-   joint pain or swelling.  Neuro-     nothing unusual Psych:  No- change in mood or affect. No depression or anxiety.  No memory loss.    Objective:  OBJ- Physical Exam General- Alert, Oriented, Affect-appropriate, Distress- none acute, obviously much thinner Skin- rash-none, lesions- none, excoriation- none. Pale complexion Lymphadenopathy- none Head- atraumatic            Eyes- Gross vision intact, PERRLA, conjunctivae and secretions clear            Ears- Hearing, canals-normal            Nose- Clear, no-Septal dev, mucus, polyps, erosion, perforation             Throat- Mallampati II , mucosa clear , drainage- none, tonsils- atrophic Neck- flexible , trachea midline, no stridor , thyroid nl, carotid no bruit Chest - symmetrical excursion , unlabored           Heart/CV- slow RRR ,  no murmur , no gallop  , no rub, nl s1 s2                           - JVD- none , edema- none, stasis changes- none, varices- none           Lung- clear to P&A, wheeze- none, cough- none , dullness-none, rub- none           Chest wall-  Abd- Br/ Gen/ Rectal- Not done, not indicated Extrem- cyanosis- none, clubbing, none, atrophy- none, strength- nl Neuro- grossly intact to observation

## 2011-09-04 NOTE — Patient Instructions (Addendum)
Refill ritalin 10 mg, but you can choose to skip more doses as you feel able.  Refill Sonata for use as needed  Please call as needed

## 2011-09-10 NOTE — Assessment & Plan Note (Addendum)
Still uses Ritalin and we discussed this carefully in view of his cardiac history. I suggested he try to slowly taper off, testing if he still feels he needs it. Notes occasional difficulty maintaining sleep early morning wake up. We discussed sleep hygiene and medications again.

## 2011-09-10 NOTE — Assessment & Plan Note (Signed)
Good therapeutic response to weight loss.

## 2011-09-24 DIAGNOSIS — M6740 Ganglion, unspecified site: Secondary | ICD-10-CM

## 2011-09-24 HISTORY — DX: Ganglion, unspecified site: M67.40

## 2011-09-26 ENCOUNTER — Other Ambulatory Visit: Payer: Self-pay | Admitting: Orthopedic Surgery

## 2011-09-26 ENCOUNTER — Encounter: Payer: BC Managed Care – PPO | Attending: Surgery | Admitting: *Deleted

## 2011-09-26 ENCOUNTER — Encounter: Payer: Self-pay | Admitting: *Deleted

## 2011-09-26 VITALS — Ht >= 80 in | Wt 230.5 lb

## 2011-09-26 DIAGNOSIS — Z09 Encounter for follow-up examination after completed treatment for conditions other than malignant neoplasm: Secondary | ICD-10-CM | POA: Insufficient documentation

## 2011-09-26 DIAGNOSIS — E669 Obesity, unspecified: Secondary | ICD-10-CM | POA: Insufficient documentation

## 2011-09-26 DIAGNOSIS — Z713 Dietary counseling and surveillance: Secondary | ICD-10-CM | POA: Insufficient documentation

## 2011-09-26 NOTE — Progress Notes (Addendum)
  Follow-up visit:  12 Months Post-Operative Gastric Bypass Surgery  Medical Nutrition Therapy:  Appt start time:  800  End time: 845.  Primary concerns today:  Post-operative bariatric surgery nutrition management.  Surgery date: 10/10/10  Start weight at [facility]: 358.7 lbs  Weight today: 230.5 lbs Weight change: 0.7 lbs Total weight lost: 128.2 lbs  BMI: 25.3 kg/m^2  Weight goal: 225 lbs  % Weight goal met: 96%  TANITA  BODY COMP RESULTS  09/26/11   %Fat 24.8%   Fat Mass (lbs) 57.0   Fat Free Mass (lbs) 173.5   Total Body Water (lbs) 127.0   24-hr recall: Pt and his wife bring in all food and fluid log sheets. See scanned sheets under MEDIA TAB. *Some days showing low protein intake (25g, 35g, 29g, 42g, 31g - likely d/t increased travel this summer)   Fluid intake: 60-85 oz (all sugar-free) Estimated total protein intake: avg 55-85g per day  Medications: No changes from last visit. Supplementation: No problems reported; Taking supplements regularly  CBG monitoring: Not regularly Average CBG per patient: n/a Last patient reported A1c: 6.3% (09/15/11 @ MD) Last patient reported BP: 110/62 (same visit)  Using straws: No Drinking while eating: No Hair loss: No Carbonated beverages: No N/V/D/C: No  Dumping syndrome: None  Recent physical activity:  3 days/wk [employer]; walks the other 3 days @ 45-60 min/ea  Progress Towards Goal(s):  In progress.  Nutritional Diagnosis:  No nutrition diagnosis at this time.    Intervention:  Nutrition education/reinforcement.  Monitoring/Evaluation:  Dietary intake, exercise, and body weight in 6 months for 18 month post-op visit or prn.

## 2011-09-26 NOTE — Patient Instructions (Addendum)
Goals:  Add protein to all snacks (including those with fruit). Aim to reach protein goal of 80g daily.  Add 1/2 cup of starchy vegetables one time a day to start. Choose higher fiber options.   Continue previous goals set by Amy Bridges.   

## 2011-09-29 ENCOUNTER — Encounter (HOSPITAL_BASED_OUTPATIENT_CLINIC_OR_DEPARTMENT_OTHER): Payer: Self-pay | Admitting: *Deleted

## 2011-09-29 NOTE — Pre-Procedure Instructions (Signed)
To come for BMET and CXR; cardiology office note, testing results and EKG req. from [facility]

## 2011-10-02 ENCOUNTER — Ambulatory Visit
Admission: RE | Admit: 2011-10-02 | Discharge: 2011-10-02 | Disposition: A | Discharge status: Home / Self Care | Payer: BC Managed Care – PPO | Source: Ambulatory Visit | Attending: Anesthesiology | Admitting: Anesthesiology

## 2011-10-02 ENCOUNTER — Encounter (HOSPITAL_BASED_OUTPATIENT_CLINIC_OR_DEPARTMENT_OTHER)
Admission: RE | Admit: 2011-10-02 | Discharge: 2011-10-02 | Disposition: A | Discharge status: Home / Self Care | Payer: BC Managed Care – PPO | Source: Ambulatory Visit | Attending: Orthopedic Surgery | Admitting: Orthopedic Surgery

## 2011-10-02 LAB — BASIC METABOLIC PANEL
BUN: 16 mg/dL (ref 6–23)
CO2: 28 mEq/L (ref 19–32)
Calcium: 9.3 mg/dL (ref 8.4–10.5)
Chloride: 104 mEq/L (ref 96–112)
Creatinine, Ser: 0.87 mg/dL (ref 0.50–1.35)
GFR calc Af Amer: >90 mL/min (ref >90)
GFR calc non Af Amer: 90 mL/min — ABNORMAL LOW (ref >90)
Glucose, Bld: 107 mg/dL — ABNORMAL HIGH (ref 70–99)
Potassium: 3.9 mEq/L (ref 3.5–5.1)
Sodium: 139 mEq/L (ref 135–145)

## 2011-10-02 NOTE — Pre-Procedure Instructions (Signed)
Reviewed with Dr. Gelene Mink, pt. OK to come for surgery.

## 2011-10-04 ENCOUNTER — Encounter (HOSPITAL_BASED_OUTPATIENT_CLINIC_OR_DEPARTMENT_OTHER): Payer: Self-pay | Admitting: Anesthesiology

## 2011-10-04 ENCOUNTER — Encounter (HOSPITAL_BASED_OUTPATIENT_CLINIC_OR_DEPARTMENT_OTHER): Payer: Self-pay | Admitting: *Deleted

## 2011-10-04 ENCOUNTER — Encounter (HOSPITAL_BASED_OUTPATIENT_CLINIC_OR_DEPARTMENT_OTHER): Payer: Self-pay | Admitting: Orthopedic Surgery

## 2011-10-04 ENCOUNTER — Encounter (HOSPITAL_BASED_OUTPATIENT_CLINIC_OR_DEPARTMENT_OTHER)
Admission: RE | Disposition: A | Discharge status: Home / Self Care | Payer: Self-pay | Source: Ambulatory Visit | Attending: Orthopedic Surgery

## 2011-10-04 ENCOUNTER — Ambulatory Visit (HOSPITAL_BASED_OUTPATIENT_CLINIC_OR_DEPARTMENT_OTHER): Payer: BC Managed Care – PPO | Admitting: Anesthesiology

## 2011-10-04 ENCOUNTER — Ambulatory Visit (HOSPITAL_BASED_OUTPATIENT_CLINIC_OR_DEPARTMENT_OTHER)
Admission: RE | Admit: 2011-10-04 | Discharge: 2011-10-04 | Disposition: A | Discharge status: Home / Self Care | Payer: BC Managed Care – PPO | Source: Ambulatory Visit | Attending: Orthopedic Surgery | Admitting: Orthopedic Surgery

## 2011-10-04 DIAGNOSIS — I2510 Atherosclerotic heart disease of native coronary artery without angina pectoris: Secondary | ICD-10-CM | POA: Insufficient documentation

## 2011-10-04 DIAGNOSIS — E119 Type 2 diabetes mellitus without complications: Secondary | ICD-10-CM | POA: Insufficient documentation

## 2011-10-04 DIAGNOSIS — E039 Hypothyroidism, unspecified: Secondary | ICD-10-CM | POA: Insufficient documentation

## 2011-10-04 DIAGNOSIS — I10 Essential (primary) hypertension: Secondary | ICD-10-CM | POA: Insufficient documentation

## 2011-10-04 DIAGNOSIS — M6740 Ganglion, unspecified site: Secondary | ICD-10-CM | POA: Insufficient documentation

## 2011-10-04 DIAGNOSIS — K219 Gastro-esophageal reflux disease without esophagitis: Secondary | ICD-10-CM | POA: Insufficient documentation

## 2011-10-04 DIAGNOSIS — G4730 Sleep apnea, unspecified: Secondary | ICD-10-CM | POA: Insufficient documentation

## 2011-10-04 HISTORY — DX: Ganglion, unspecified site: M67.40

## 2011-10-04 HISTORY — DX: Gastro-esophageal reflux disease without esophagitis: K21.9

## 2011-10-04 HISTORY — DX: Dental restoration status: Z98.811

## 2011-10-04 HISTORY — DX: Hypothyroidism, unspecified: E03.9

## 2011-10-04 HISTORY — DX: Atrioventricular block, first degree: I44.0

## 2011-10-04 HISTORY — PX: MASS EXCISION: SHX2000

## 2011-10-04 HISTORY — DX: Atherosclerotic heart disease of native coronary artery without angina pectoris: I25.10

## 2011-10-04 HISTORY — DX: Personal history of Methicillin resistant Staphylococcus aureus infection: Z86.14

## 2011-10-04 LAB — POCT HEMOGLOBIN-HEMACUE: Hemoglobin: 10.1 g/dL — ABNORMAL LOW (ref 13.0–17.0)

## 2011-10-04 SURGERY — EXCISION MASS
Anesthesia: Monitor Anesthesia Care | Site: Hand | Laterality: Left | Wound class: Clean

## 2011-10-04 MED ORDER — ONDANSETRON HCL 4 MG/2ML IJ SOLN
INTRAMUSCULAR | Status: DC | PRN
  Administered 2011-10-04: 4 mg via INTRAVENOUS

## 2011-10-04 MED ORDER — LIDOCAINE HCL (CARDIAC) 20 MG/ML IV SOLN
INTRAVENOUS | Status: DC | PRN
  Administered 2011-10-04: 30 mg via INTRAVENOUS

## 2011-10-04 MED ORDER — MIDAZOLAM HCL 2 MG/2ML IJ SOLN: 0.5000 mg | Freq: Once | INTRAMUSCULAR | Status: DC | PRN

## 2011-10-04 MED ORDER — LIDOCAINE HCL (PF) 0.5 % IJ SOLN
INTRAMUSCULAR | Status: DC | PRN
  Administered 2011-10-04: 30 mL via INTRAVENOUS

## 2011-10-04 MED ORDER — VANCOMYCIN HCL 1000 MG IV SOLR: 1000.0000 mg | INTRAVENOUS | Status: DC | PRN

## 2011-10-04 MED ORDER — FENTANYL CITRATE 0.05 MG/ML IJ SOLN
INTRAMUSCULAR | Status: DC | PRN
  Administered 2011-10-04: 50 ug via INTRAVENOUS

## 2011-10-04 MED ORDER — VANCOMYCIN HCL IN DEXTROSE 1-5 GM/200ML-% IV SOLN: 1000.0000 mg | INTRAVENOUS | Status: DC

## 2011-10-04 MED ORDER — FENTANYL CITRATE 0.05 MG/ML IJ SOLN: 25.0000 ug | INTRAMUSCULAR | Status: DC | PRN

## 2011-10-04 MED ORDER — BUPIVACAINE HCL (PF) 0.25 % IJ SOLN
INTRAMUSCULAR | Status: DC | PRN
  Administered 2011-10-04: 7 mL

## 2011-10-04 MED ORDER — HYDROCODONE-ACETAMINOPHEN 5-500 MG PO TABS: 1.0000 | ORAL_TABLET | ORAL | Status: AC | PRN

## 2011-10-04 MED ORDER — LACTATED RINGERS IV SOLN
INTRAVENOUS | Status: DC
  Administered 2011-10-04 (×2): via INTRAVENOUS

## 2011-10-04 MED ORDER — CHLORHEXIDINE GLUCONATE 4 % EX LIQD: 60.0000 mL | Freq: Once | CUTANEOUS | Status: DC

## 2011-10-04 MED ORDER — MEPERIDINE HCL 25 MG/ML IJ SOLN: 6.2500 mg | INTRAMUSCULAR | Status: DC | PRN

## 2011-10-04 MED ORDER — PROMETHAZINE HCL 25 MG/ML IJ SOLN: 6.2500 mg | INTRAMUSCULAR | Status: DC | PRN

## 2011-10-04 MED ORDER — VANCOMYCIN HCL 1000 MG IV SOLR
1000.0000 mg | INTRAVENOUS | Status: DC | PRN
  Administered 2011-10-04: 1000 mg via INTRAVENOUS

## 2011-10-04 MED ORDER — MIDAZOLAM HCL 5 MG/5ML IJ SOLN
INTRAMUSCULAR | Status: DC | PRN
  Administered 2011-10-04: 1 mg via INTRAVENOUS

## 2011-10-04 MED ORDER — PROPOFOL 10 MG/ML IV EMUL
INTRAVENOUS | Status: DC | PRN
  Administered 2011-10-04: 75 ug/kg/min via INTRAVENOUS

## 2011-10-04 SURGICAL SUPPLY — 48 items
BANDAGE COBAN STERILE 2 (GAUZE/BANDAGES/DRESSINGS) IMPLANT
BANDAGE GAUZE ELAST BULKY 4 IN (GAUZE/BANDAGES/DRESSINGS) IMPLANT
BLADE MINI RND TIP GREEN BEAV (BLADE) IMPLANT
BLADE SURG 15 STRL LF DISP TIS (BLADE) ×1 IMPLANT
BLADE SURG 15 STRL SS (BLADE) ×1
BNDG COHESIVE 1X5 TAN STRL LF (GAUZE/BANDAGES/DRESSINGS) ×2 IMPLANT
BNDG COHESIVE 3X5 TAN STRL LF (GAUZE/BANDAGES/DRESSINGS) IMPLANT
BNDG ESMARK 4X9 LF (GAUZE/BANDAGES/DRESSINGS) IMPLANT
CHLORAPREP W/TINT 26ML (MISCELLANEOUS) ×2 IMPLANT
CLOTH BEACON ORANGE TIMEOUT ST (SAFETY) ×2 IMPLANT
CORDS BIPOLAR (ELECTRODE) ×2 IMPLANT
COVER MAYO STAND STRL (DRAPES) ×2 IMPLANT
COVER TABLE BACK 60X90 (DRAPES) ×2 IMPLANT
CUFF TOURNIQUET SINGLE 18IN (TOURNIQUET CUFF) ×2 IMPLANT
DECANTER SPIKE VIAL GLASS SM (MISCELLANEOUS) IMPLANT
DRAIN PENROSE 1/2X12 LTX STRL (WOUND CARE) IMPLANT
DRAPE EXTREMITY T 121X128X90 (DRAPE) ×2 IMPLANT
DRAPE SURG 17X23 STRL (DRAPES) ×2 IMPLANT
GAUZE XEROFORM 1X8 LF (GAUZE/BANDAGES/DRESSINGS) ×2 IMPLANT
GLOVE BIO SURGEON STRL SZ 6.5 (GLOVE) ×4 IMPLANT
GLOVE SURG ORTHO 8.0 STRL STRW (GLOVE) ×2 IMPLANT
GOWN BRE IMP PREV XXLGXLNG (GOWN DISPOSABLE) ×2 IMPLANT
GOWN PREVENTION PLUS XLARGE (GOWN DISPOSABLE) ×4 IMPLANT
NDL SAFETY ECLIPSE 18X1.5 (NEEDLE) ×1 IMPLANT
NEEDLE 27GAX1X1/2 (NEEDLE) IMPLANT
NEEDLE HYPO 18GX1.5 SHARP (NEEDLE) ×1
NS IRRIG 1000ML POUR BTL (IV SOLUTION) ×2 IMPLANT
PACK BASIN DAY SURGERY FS (CUSTOM PROCEDURE TRAY) ×2 IMPLANT
PAD CAST 3X4 CTTN HI CHSV (CAST SUPPLIES) ×1 IMPLANT
PADDING CAST ABS 3INX4YD NS (CAST SUPPLIES) ×1
PADDING CAST ABS 4INX4YD NS (CAST SUPPLIES) ×1
PADDING CAST ABS COTTON 3X4 (CAST SUPPLIES) ×1 IMPLANT
PADDING CAST ABS COTTON 4X4 ST (CAST SUPPLIES) ×1 IMPLANT
PADDING CAST COTTON 3X4 STRL (CAST SUPPLIES) ×1
SPLINT FNGR BALL END 5/8X4.25 (SOFTGOODS) ×1 IMPLANT
SPLINT PLASTALUME BALL 4 1/4IN (SOFTGOODS) ×2
SPLINT PLASTER CAST XFAST 3X15 (CAST SUPPLIES) IMPLANT
SPLINT PLASTER XTRA FASTSET 3X (CAST SUPPLIES)
SPONGE GAUZE 4X4 12PLY (GAUZE/BANDAGES/DRESSINGS) ×2 IMPLANT
STOCKINETTE 4X48 STRL (DRAPES) ×2 IMPLANT
SUT VIC AB 4-0 P2 18 (SUTURE) IMPLANT
SUT VICRYL RAPID 5 0 P 3 (SUTURE) IMPLANT
SUT VICRYL RAPIDE 4/0 PS 2 (SUTURE) ×2 IMPLANT
SYR BULB 3OZ (MISCELLANEOUS) ×2 IMPLANT
SYR CONTROL 10ML LL (SYRINGE) ×2 IMPLANT
TOWEL OR 17X24 6PK STRL BLUE (TOWEL DISPOSABLE) ×4 IMPLANT
UNDERPAD 30X30 INCONTINENT (UNDERPADS AND DIAPERS) ×2 IMPLANT
WATER STERILE IRR 1000ML POUR (IV SOLUTION) ×2 IMPLANT

## 2011-10-04 NOTE — Op Note (Signed)
DICTATED NUMBER: [id number]

## 2011-10-04 NOTE — Transfer of Care (Signed)
Immediate Anesthesia Transfer of Care Note  Patient: [name]  Procedure(s) Performed: Procedure(s) (LRB) with comments: EXCISION MASS (Left) - excision cyst debridment ip joint of left thumb  Patient Location: PACU  Anesthesia Type: General  Level of Consciousness: awake, alert , oriented and patient cooperative  Airway & Oxygen Therapy: Patient Spontanous Breathing and Patient connected to face mask oxygen  Post-op Assessment: Report given to PACU RN and Post -op Vital signs reviewed and stable  Post vital signs: Reviewed and stable  Complications: No apparent anesthesia complications

## 2011-10-04 NOTE — Brief Op Note (Signed)
10/04/2011  1:41 PM  PATIENT:  [name]  61 y.o. male  PRE-OPERATIVE DIAGNOSIS:  mucoid tumor left thumb   POST-OPERATIVE DIAGNOSIS:  mucoid tumor left thumb   PROCEDURE:  Procedure(s) (LRB) with comments: EXCISION MASS (Left) - excision cyst debridment ip joint of left thumb  SURGEON:  Surgeon(s) and Role:    * Nicki Reaper, MD - Primary  PHYSICIAN ASSISTANT:   ASSISTANTS: none   ANESTHESIA:   local and regional  EBL:  Total I/O In: 1000 [I.V.:1000] Out: -   BLOOD ADMINISTERED:none  DRAINS: none   LOCAL MEDICATIONS USED:  MARCAINE     SPECIMEN:  Excision  DISPOSITION OF SPECIMEN:  PATHOLOGY  COUNTS:  YES  TOURNIQUET:   Total Tourniquet Time Documented: Forearm (Left) - 24 minutes  DICTATION: .Other Dictation: Dictation Number [id number]  PLAN OF CARE: Discharge to home after PACU  PATIENT DISPOSITION:  PACU - hemodynamically stable.

## 2011-10-04 NOTE — Anesthesia Procedure Notes (Signed)
Procedure Name: MAC Date/Time: 10/04/2011 1:05 PM Performed by: Jammy Plotkin D Pre-anesthesia Checklist: Patient identified, Emergency Drugs available, Suction available and Patient being monitored Patient Re-evaluated:Patient Re-evaluated prior to inductionOxygen Delivery Method: Simple face mask

## 2011-10-04 NOTE — H&P (Signed)
[name] is a 61 year old right hand dominant male who is referred by Dr. Valentina Lucks for a consultation with respect to a mass on his left thumb IP joint. This has been present for approximately one year. He recalls no history of injury. He is not complaining of significant pain or discomfort. He has no history of injury. He has a history of thyroid problems. He has no history of arthritis, diabetes or gout. He states that he had diabetes and had a gastric bypass done and his diabetes and high BP cleared. He has had no treatment for this nor has he tried anything.  PAST MEDICAL HISTORY: He is allergic to PCN and Moxifloxacin. He is on Synthroid, Sonata, a statin, Nitroglycerin, Cialis, Prilosec, Citracal, and Plavix. He has had a gastric bypass and toe amputation for MRSA.  FAMILY H ISTORY: Positive for diabetes, heart disease, and high BP.  SOCIAL HISTORY: He does not smoke or drink. He is a IT trainer.  REVIEW OF SYSTEMS: Positive for glasses, otherwise negative for 14 points. [name] is an 61 y.o. male.   Chief Complaint: Mucoid cyst and DJD left thumb HPI: see above  Past Medical History  Diagnosis Date  . Hyperlipidemia   . Heart attack 06/2004  . Coronary artery disease   . Hypothyroidism   . GERD (gastroesophageal reflux disease)   . Diabetes mellitus     diet-controlled  . Sleep apnea     sleep study 03/29/2011; no CPAP use  . Mucoid cyst of joint 09/2011    left thumb  . Hx MRSA infection 02/2006  . Dental crowns present   . Hypertension     hx. of - has not been on med. since losing wt. after gastric bypass  . Bifascicular block   . First degree AV block   . Heart murmur     aortic    Past Surgical History  Procedure Date  . Roux-en-y gastric bypass 10/10/2010    laparoscopic  . Toe amputation 02/23/2006    left foot second ray amputation  . Coronary angioplasty with stent placement 07/12/2004; 08/03/2004    total of 3 stents  . Cataract extraction 02/2010; 03/2010     History reviewed. No pertinent family history. Social History:  reports that he has never smoked. He has never used smokeless tobacco. He reports that he does not drink alcohol or use illicit drugs.  Allergies:  Allergies  Allergen Reactions  . Moxifloxacin Rash  . Penicillins Rash    Medications Prior to Admission  Medication Sig Dispense Refill  . aspirin 81 MG tablet Take 81 mg by mouth daily.        Marland Kitchen atorvastatin (LIPITOR) 10 MG tablet Take 10 mg by mouth daily.      . Calcium Carbonate (CALCIUM 500 PO) Take 3 tablets by mouth daily.      . clopidogrel (PLAVIX) 75 MG tablet Take 75 mg by mouth daily. Will stop med. after 09/30/2011 dose until after surgery      . Cyanocobalamin (B-12) 500 MCG SUBL Place under the tongue daily.      Marland Kitchen FREESTYLE TEST STRIPS test strip 2 (two) times a week.       . Glucosamine-MSM-Hyaluronic Acd (JOINT HEALTH) 750-375-30 MG TABS Take 2 tablets by mouth daily.       Marland Kitchen levothyroxine (SYNTHROID, LEVOTHROID) 125 MCG tablet Take 137 mcg by mouth daily.       . Multiple Vitamins-Minerals (MULTIVITAMIN PO) Take 2 tablets by mouth  daily. Chewable tablet      . OMEGA 3 1000 MG CAPS Take 1 capsule by mouth daily.       . Omeprazole (PRILOSEC PO) Take 1 tablet by mouth every other day.       . Probiotic Product (PROBIOTIC FORMULA PO) Take by mouth.      . tadalafil (CIALIS) 20 MG tablet Take 20 mg by mouth daily as needed.        . zaleplon (SONATA) 10 MG capsule Take 1 capsule (10 mg total) by mouth at bedtime as needed.  30 capsule  prn    Results for orders placed during the hospital encounter of 10/04/11 (from the past 48 hour(s))  BASIC METABOLIC PANEL     Status: Abnormal   Collection Time   10/02/11  2:30 PM      Component Value Range Comment   Sodium 139  135 - 145 mEq/L    Potassium 3.9  3.5 - 5.1 mEq/L    Chloride 104  96 - 112 mEq/L    CO2 28  19 - 32 mEq/L    Glucose, Bld 107 (*) 70 - 99 mg/dL    BUN 16  6 - 23 mg/dL    Creatinine, Ser  1.61  0.50 - 1.35 mg/dL    Calcium 9.3  8.4 - 09.6 mg/dL    GFR calc non Af Amer 90 (*) >90 mL/min    GFR calc Af Amer >90  >90 mL/min   POCT HEMOGLOBIN-HEMACUE     Status: Abnormal   Collection Time   10/04/11 11:21 AM      Component Value Range Comment   Hemoglobin 10.1 (*) 13.0 - 17.0 g/dL     Dg Chest 2 View  0/04/5407  *RADIOLOGY REPORT*  Clinical Data: Preop for cyst removal on film.  Nonsmoker.  Gastric bypass.  CHEST - 2 VIEW  Comparison: 06/27/2008  Findings: Mild hyperinflation. Midline trachea.  Normal heart size and mediastinal contours. No pleural effusion or pneumothorax. Diffuse peribronchial thickening.  Clear lungs.  IMPRESSION:  1. No acute cardiopulmonary disease. 2.  Mild pulmonary interstitial thickening.  Given the lack of smoking history, this likely is due to chronic bronchitis or asthma.   Original Report Authenticated By: Consuello Bossier, M.D.      Pertinent items are noted in HPI.  Blood pressure 145/72, pulse 42, temperature 98 F (36.7 C), temperature source Oral, resp. rate 18, height 6\' 8"  (2.032 m), weight 230 lb 4 oz (104.441 kg), SpO2 100.00%.  General appearance: alert, cooperative and appears stated age Head: Normocephalic, without obvious abnormality Neck: no adenopathy Resp: clear to auscultation bilaterally Cardio: regular rate and rhythm, S1, S2 normal, no murmur, click, rub or gallop GI: soft, non-tender; bowel sounds normal; no masses,  no organomegaly Extremities: extremities normal, atraumatic, no cyanosis or edema Pulses: 2+ and symmetric Skin: Skin color, texture, turgor normal. No rashes or lesions Neurologic: Grossly normal Incision/Wound: na  Assessment/Plan X-rays of his Left thumb reveals degenerative change at the IP joint of his thumb.  Diagnosis: Mucoid cyst with degenerative arthritis IP joint leftthumb.  We have discussed the possibility of surgical excision with him. The pre, peri and post op course are discussed along with  risks and complications. He is aware there is no guarantee with surgery, possibility of infection, recurrence, injury to arteries, nerves and tendons, incomplete relief of symptoms and dystrophy.  He would like to proceed. He is scheduled for excision mucoid cyst, debridement of IP  joint left thumb as an outpatient.  GK/pe  Rodgers Likes,Sandra R 10/04/2011, 12:43 PM

## 2011-10-04 NOTE — Anesthesia Postprocedure Evaluation (Signed)
  Anesthesia Post-op Note  Patient: [name]  Procedure(s) Performed: Procedure(s) (LRB) with comments: EXCISION MASS (Left) - excision cyst debridment ip joint of left thumb  Patient Location: PACU  Anesthesia Type: Bier block  Level of Consciousness: awake, alert , oriented and patient cooperative  Airway and Oxygen Therapy: Patient Spontanous Breathing  Post-op Pain: none  Post-op Assessment: Post-op Vital signs reviewed, Patient's Cardiovascular Status Stable, Respiratory Function Stable, Patent Airway, No signs of Nausea or vomiting and Pain level controlled  Post-op Vital Signs: Reviewed and stable  Complications: No apparent anesthesia complications

## 2011-10-04 NOTE — Anesthesia Preprocedure Evaluation (Addendum)
Anesthesia Evaluation  Patient identified by MRN, date of birth, ID band Patient awake    Reviewed: Allergy & Precautions, H&P , NPO status , Patient's Chart, lab work & pertinent test results  History of Anesthesia Complications Negative for: history of anesthetic complications  Airway Mallampati: I TM Distance: >3 FB Neck ROM: Full    Dental No notable dental hx. (+) Teeth Intact and Dental Advisory Given   Pulmonary neg pulmonary ROS, sleep apnea (off CPAP with 140# weight loss) ,  breath sounds clear to auscultation  Pulmonary exam normal       Cardiovascular hypertension, Pt. on medications + CAD, + Past MI ('06 Non-STEMI) and + Cardiac Stents ('06 stents to LAD and RCA, EF 55-60%) + Valvular Problems/Murmurs (ECHO'11: aortic valve sclerosis without stenosis, EF 60-65%) Rhythm:Regular Rate:Normal  '12 myoview:  Fixed inferior defect, no ischemia, EF 52%   Neuro/Psych negative neurological ROS     GI/Hepatic GERD-  Controlled,S/p roux-en-y gastric bypass   Endo/Other  diabetes (diet controlled ), Well Controlled, Type 2Hypothyroidism (on synthroid)   Renal/GU      Musculoskeletal   Abdominal   Peds  Hematology   Anesthesia Other Findings   Reproductive/Obstetrics                         Anesthesia Physical Anesthesia Plan  ASA: III  Anesthesia Plan: Bier Block and MAC   Post-op Pain Management:    Induction:   Airway Management Planned:   Additional Equipment:   Intra-op Plan:   Post-operative Plan:   Informed Consent: I have reviewed the patients History and Physical, chart, labs and discussed the procedure including the risks, benefits and alternatives for the proposed anesthesia with the patient or authorized representative who has indicated his/her understanding and acceptance.   Dental advisory given  Plan Discussed with: CRNA and Surgeon  Anesthesia Plan Comments:  (Plan routine monitors, IV Regional Lidocaine)        Anesthesia Quick Evaluation

## 2011-10-05 ENCOUNTER — Encounter (HOSPITAL_BASED_OUTPATIENT_CLINIC_OR_DEPARTMENT_OTHER): Payer: Self-pay | Admitting: Orthopedic Surgery

## 2011-10-05 NOTE — Op Note (Signed)
NAMETROY, KANOUSE                   ACCOUNT NO.:  [id number]  MEDICAL RECORD NO.:  [id number]  LOCATION:                                 FACILITY:  PHYSICIAN:  Cindee Salt, M.D.       DATE OF BIRTH:  [birth year]  DATE OF PROCEDURE:  10/04/2011 DATE OF DISCHARGE:                              OPERATIVE REPORT   PREOPERATIVE DIAGNOSIS:  Mucoid cyst, left thumb.  POSTOPERATIVE DIAGNOSIS:  Mucoid cyst, left thumb.  OPERATION:  Excision of mucoid cyst, debridement of interphalangeal joint, left thumb.  SURGEON:  Cindee Salt, MD  ANESTHESIA:  Forearm-based IV regional with metacarpal block.  ANESTHESIOLOGIST:  Germaine Pomfret, MD  HISTORY:  The patient is a 61 year old male with a history of a large cyst on the IP joint of his left thumb.  He is desirous to having this removed.  X-rays revealed degenerative arthritis, diagnosis of mucoid cyst, this does transilluminate.  Pre, peri and postoperative course have been discussed along with risks and complications.  He is aware that there is no guarantee with surgery; possibility of infection; recurrence of injury to arteries, nerves, tendons; incomplete relief of symptoms and dystrophy.  In the preoperative area, the patient is seen, the extremity marked by both the patient and surgeon, and antibiotic given.  PROCEDURE:  The patient was brought to the operating room where a forearm-based IV regional anesthetic was carried out without difficulty. He was prepped using ChloraPrep, supine position with the left arm free. A 3-minute dry time was allowed.  Time-out taken, confirming the patient and procedure.  A curvilinear incision was made over the IP joint of the thumb, carried down on the radial aspect, carried down through the subcutaneous tissue.  Bleeders were electrocauterized with bipolar.  The cyst was immediately encountered.  With blunt and sharp dissection, this was dissected free and sent to Pathology.  The joint was opened.   The area of degenerative change was immediately apparent.  This was debrided with a rongeur.  A moderate amount of degenerative changes were present on the distal phalanx also, this was minimally debrided.  The wound was copiously irrigated with saline.  The skin was then closed with interrupted 4-0 Vicryl Rapide sutures.  A metacarpal block with 0.25% Marcaine without epinephrine was given at the beginning of the case, 8 mL was used.  A sterile compressive dressing and splint to the thumb was applied.  On deflation of the tourniquet, the remaining fingers were turned pinked.  He was taken to the recovery room for observation in satisfactory condition. He will be discharged to home to return in 1 week, on Vicodin.          ______________________________ Cindee Salt, M.D.     GK/MEDQ  D:  10/04/2011  T:  10/05/2011  Job:  [id number]

## 2011-10-11 ENCOUNTER — Encounter (INDEPENDENT_AMBULATORY_CARE_PROVIDER_SITE_OTHER): Payer: Self-pay | Admitting: Surgery

## 2011-10-11 ENCOUNTER — Ambulatory Visit (INDEPENDENT_AMBULATORY_CARE_PROVIDER_SITE_OTHER): Payer: BC Managed Care – PPO | Admitting: Surgery

## 2011-10-11 VITALS — BP 138/82 | HR 60 | Resp 14 | Ht >= 80 in | Wt 235.0 lb

## 2011-10-11 DIAGNOSIS — Z9884 Bariatric surgery status: Secondary | ICD-10-CM

## 2011-10-11 NOTE — Progress Notes (Signed)
[name] 61 y.o.  Body mass index is 25.82 kg/(m^2).  Patient Active Problem List  Diagnosis  . HYPOTHYROIDISM  . DIABETES MELLITUS, TYPE II  . HYPERLIPIDEMIA, MIXED  . OBESITY  . OBSTRUCTIVE SLEEP APNEA  . HYPERTENSION  . MYOCARDIAL INFARCTION, HX OF  . GERD  . DEGENERATIVE JOINT DISEASE  . HYPERSOMNIA  . ANEMIA, IRON DEFICIENCY, HX OF  . 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    Allergies  Allergen Reactions  . Moxifloxacin Rash  . Penicillins Rash    Past Surgical History  Procedure Date  . Roux-en-y gastric bypass 10/10/2010    laparoscopic  . Toe amputation 02/23/2006    left foot second ray amputation  . Coronary angioplasty with stent placement 07/12/2004; 08/03/2004    total of 3 stents  . Cataract extraction 02/2010; 03/2010  . Mass excision 10/04/2011    Procedure: EXCISION MASS;  Surgeon: Nicki Reaper, MD;  Location: [facility];  Service: Orthopedics;  Laterality: Left;  excision cyst debridment ip joint of left thumb   Lillia Mountain, MD No diagnosis found.  1 year out from gastric bypass.  He has lost 109.6 lbs.  Doing great.  Only meds are Synthroid, Plavix, and Lipitor Return 6 months Matt B. Daphine Deutscher, MD, [facility] [phone] beeper [phone]  10/11/2011 5:07 PM

## 2011-10-11 NOTE — Patient Instructions (Signed)
Thanks for your patience.  If you need further assistance after leaving the office, please call our office and speak with a [facility] nurse.  [phone].  If you want to leave a message for Dr. Adna Nofziger, please call his office phone at [phone]. 

## 2011-12-13 ENCOUNTER — Telehealth (INDEPENDENT_AMBULATORY_CARE_PROVIDER_SITE_OTHER): Payer: Self-pay

## 2011-12-13 NOTE — Telephone Encounter (Signed)
LMOM asking pt to call the office and ask for either myself (Meagen) or [employer].

## 2012-05-16 ENCOUNTER — Ambulatory Visit (INDEPENDENT_AMBULATORY_CARE_PROVIDER_SITE_OTHER): Payer: BC Managed Care – PPO | Admitting: Surgery

## 2012-05-16 VITALS — BP 122/68 | HR 60 | Temp 97.4°F | Resp 16 | Ht >= 80 in | Wt 248.0 lb

## 2012-05-16 DIAGNOSIS — Z9884 Bariatric surgery status: Secondary | ICD-10-CM

## 2012-05-16 NOTE — Patient Instructions (Signed)
Follow up in Sept.

## 2012-05-16 NOTE — Progress Notes (Signed)
[name] 62 y.o.  Body mass index is 27.24 kg/(m^2).  Patient Active Problem List  Diagnosis  . HYPOTHYROIDISM  . HYPERLIPIDEMIA, MIXED  . MYOCARDIAL INFARCTION, HX OF  . DEGENERATIVE JOINT DISEASE  . ANEMIA, IRON DEFICIENCY, HX OF  . 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    Allergies  Allergen Reactions  . Moxifloxacin Rash  . Penicillins Rash    Past Surgical History  Procedure Laterality Date  . Roux-en-y gastric bypass  10/10/2010    laparoscopic  . Toe amputation  02/23/2006    left foot second ray amputation  . Coronary angioplasty with stent placement  07/12/2004; 08/03/2004    total of 3 stents  . Cataract extraction  02/2010; 03/2010  . Mass excision  10/04/2011    Procedure: EXCISION MASS;  Surgeon: Nicki Reaper, MD;  Location: [facility];  Service: Orthopedics;  Laterality: Left;  excision cyst debridment ip joint of left thumb   Lillia Mountain, MD No diagnosis found.  Doing very well.  I Updating his problem list to reflect the fact that his diabetes, obstructive sleep apnea, hypertension, obesity, and GERD have resolved. He is doing great. He is dealing with his constipation tissue using a combination of increased fiber and MiraLAX. I will see him again in September which will be as two-year anniversary Lucas B. Daphine Deutscher, MD, [facility] [phone] beeper [phone]  05/16/2012 10:13 AM

## 2012-05-30 ENCOUNTER — Encounter: Payer: BC Managed Care – PPO | Attending: Surgery | Admitting: *Deleted

## 2012-05-30 DIAGNOSIS — Z713 Dietary counseling and surveillance: Secondary | ICD-10-CM | POA: Insufficient documentation

## 2012-05-30 DIAGNOSIS — Z09 Encounter for follow-up examination after completed treatment for conditions other than malignant neoplasm: Secondary | ICD-10-CM | POA: Insufficient documentation

## 2012-05-30 DIAGNOSIS — E669 Obesity, unspecified: Secondary | ICD-10-CM | POA: Insufficient documentation

## 2012-05-30 DIAGNOSIS — Z9884 Bariatric surgery status: Secondary | ICD-10-CM | POA: Insufficient documentation

## 2012-05-30 NOTE — Progress Notes (Signed)
  Follow-up visit:  18 Months Post-Operative Gastric Bypass Surgery  Medical Nutrition Therapy:  Appt start time:  330   End time:  400.  Primary concerns today:  Post-operative bariatric surgery nutrition management. Weight gain of 19.5 lbs in last 6 mos. States he has been maintaining this weight (+/- 5-10 lbs) for awhile. Using Miralax and Benefiber daily for mild constipation and preventative measure.   Surgery date: 10/10/10  [facility] weight at [facility]: 358.7 lbs  Weight today: 250.0 lbs Weight change: 19.5 lbs Total weight lost: 108.7 lbs  BMI: 27.5 kg/m^2  Weight goal: 225 lbs  % Weight goal met: 81%  TANITA  BODY COMP RESULTS  09/26/11 05/30/12   BMI (kg/m^2) 25.3 27.5   Fat Mass (lbs) 57.0 64.5   Fat Free Mass (lbs) 173.5 185.5   Total Body Water (lbs) 127.0 136.0   24-hr recall: Pt and his wife bring in all food and fluid log sheets. See scanned sheets under MEDIA TAB. Intake looks WNL overall, though average protein intake slightly decreased. Averages ~70g CHO per meal. Still consuming [employer] bars and 1 protein shake daily.    Fluid intake: 60-85 oz (all sugar-free) Estimated total protein intake: ~ 55-70g per day (decreased from previous - father has been in hospital)  Medications: No changes from last visit Supplementation: Taking supplements regularly  CBG monitoring: Not regularly Average CBG per patient: n/a Last patient reported A1c: 6.3% (09/15/11 @ MD)  Using straws: No Drinking while eating: No Hair loss: No Carbonated beverages: No N/V/D/C:  1-2 BMs daily; takes Benefiber and Miralax daily as preventative measure Dumping syndrome: None  Recent physical activity:  3 days/wk [employer]; walks the other 3 days @ 45-60 min/ea; Reports an average of 10-13 K steps daily.  Progress Towards Goal(s):  In progress.  Nutritional Diagnosis:  No nutrition diagnosis at this time.    Intervention:  Nutrition education/reinforcement.  Monitoring/Evaluation:  Dietary  intake, exercise, and body weight in 5-6 months for 2 year post-op visit or prn.

## 2012-05-30 NOTE — Patient Instructions (Addendum)
Goals:  Add protein to all snacks (including those with fruit). Aim to reach protein goal of 80g daily.  Add 1/2 cup of starchy vegetables one time a day to start. Choose higher fiber options.   Continue previous goals set by Royal Hawthorn.

## 2012-06-05 ENCOUNTER — Encounter: Payer: Self-pay | Admitting: *Deleted

## 2012-09-03 ENCOUNTER — Ambulatory Visit: Payer: BC Managed Care – PPO | Admitting: Internal Medicine

## 2012-10-10 ENCOUNTER — Ambulatory Visit (INDEPENDENT_AMBULATORY_CARE_PROVIDER_SITE_OTHER): Payer: BC Managed Care – PPO | Admitting: Internal Medicine

## 2012-10-10 ENCOUNTER — Ambulatory Visit (INDEPENDENT_AMBULATORY_CARE_PROVIDER_SITE_OTHER): Payer: BC Managed Care – PPO | Admitting: Surgery

## 2012-10-10 ENCOUNTER — Encounter (INDEPENDENT_AMBULATORY_CARE_PROVIDER_SITE_OTHER): Payer: Self-pay | Admitting: Surgery

## 2012-10-10 ENCOUNTER — Encounter: Payer: Self-pay | Admitting: Internal Medicine

## 2012-10-10 VITALS — BP 118/60 | HR 63 | Ht >= 80 in | Wt 254.8 lb

## 2012-10-10 VITALS — BP 128/90 | HR 52 | Temp 98.0°F | Resp 14 | Ht >= 80 in | Wt 251.8 lb

## 2012-10-10 DIAGNOSIS — Z9884 Bariatric surgery status: Secondary | ICD-10-CM

## 2012-10-10 DIAGNOSIS — G4733 Obstructive sleep apnea (adult) (pediatric): Secondary | ICD-10-CM

## 2012-10-10 NOTE — Progress Notes (Signed)
[name] 61 y.o.  Body mass index is 27.66 kg/(m^2).  Patient Active Problem List   Diagnosis Date Noted  . 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 HYPOTHYROIDISM 01/22/2007  . HYPERLIPIDEMIA, MIXED 01/22/2007  . MYOCARDIAL INFARCTION, HX OF 01/22/2007  . DEGENERATIVE JOINT DISEASE 01/22/2007  . ANEMIA, IRON DEFICIENCY, HX OF 01/22/2007    Allergies  Allergen Reactions  . Moxifloxacin Rash  . Penicillins Rash    Past Surgical History  Procedure Laterality Date  . Roux-en-y gastric bypass  10/10/2010    laparoscopic  . Toe amputation  02/23/2006    left foot second ray amputation  . Coronary angioplasty with stent placement  07/12/2004; 08/03/2004    total of 3 stents  . Cataract extraction  02/2010; 03/2010  . Mass excision  10/04/2011    Procedure: EXCISION MASS;  Surgeon: Nicki Reaper, MD;  Location: [facility];  Service: Orthopedics;  Laterality: Left;  excision cyst debridment ip joint of left thumb   Lillia Mountain, MD No diagnosis found.  2 year postop of Roux-en-Y gastric bypass. Care he has no complaints at all. Some mild constipation. Etter Sjogren did a panniculectomy on him in July which is healed and looks nice. He remains discipline than his approach to diet and exercise and overall has done through well. He is followed carefully by Kirby Funk and I think I will be glad to see him on an annual basis going forward.  During their well post Roux-en-Y gastric bypass. Followup in 1 year Matt B. Daphine Deutscher, MD, [facility] [phone] beeper [phone]  10/10/2012 12:34 PM

## 2012-10-10 NOTE — Patient Instructions (Signed)
Thanks for your patience.  If you need further assistance after leaving the office, please call our office and speak with a [facility] nurse.  [phone].  If you want to leave a message for Dr. Arcola Freshour, please call his office phone at [phone]. 

## 2012-10-10 NOTE — Patient Instructions (Addendum)
As long as you can keep your weight down, the sleep apnea should stay controlled.  Dr Valentina Lucks can refill the [facility] for you as long as that is all you need.  Don't forget to get a flu shot this fall !  Please call as needed

## 2012-10-10 NOTE — Progress Notes (Signed)
Patient ID: [name], male    DOB: [birth year], 62 y.o.   MRN: [id number]  HPI 07/26/10- 55 yoM never smoker, followed for OSA with hypersomnia, obesity, complicated by DM, CAD/ Hx MI, GERD, hypothyroid. Last here July 27, 2009 - note reviewed He is using Ritalin now, after some availability problems last year. Takes 1x Ritalin 20 mg SR once daily, every day. Effective and well tolerated.  CPAP all night every night with no problems, from [employer] still at 10 cwp.  Uses Sonata 10 mg only once or twice per month, or maybe an Aleve PM. He is being evaluated for gastric bypass bariatric surgery with Dr Daphine Deutscher. We discussed CPAP adjustments that may be appropriate with weight gain, and also issues of post-op safety and sleep apnea.   03/01/11-  60 yoM never smoker, followed for OSA with hypersomnia, obesity, complicated by DM, CAD/ Hx MI, GERD, hypothyroid...wife here Has had bariatric surgery, lost 100 lbs. CPAP not worn in 2 weeks- pressure too high and overdrying him.  His goal weight is 225 pounds. We discussed how this weight loss would affect and even possibly remove his need for CPAP. It will also affect best fit on his mask.  09/04/11- 60 yoM never smoker, followed for  Hx of OSA with hypersomnia before bariatric surgery , complicated by DM, CAD/ Hx MI, GERD, hypothyroid...wife here Not using CPAP anymore-states sleep test stated pt didnt need CPAP anymore.  NPSG 03/29/11- AHI 3.2/ hr- dramatically improved after weight loss to 230 lbs now.  Occasionally wakes up early with "busy brain". No problem with sleep onset.Occasional Sonata or Advil PM. Still takes Ritalin 10 mg each morning, and occasionally again after lunch.  10/10/12- 60 yoM never smoker, followed for  Hx of OSA with hypersomnia before bariatric surgery , complicated by DM, CAD/ Hx MI, GERD, hypothyroid.Marland Kitchen PCP Dr Kirby Funk FOLLOWS FOR: sleeps about 6-7 hours at night and feels rested in the mornings; no longer uses CPAP or Ritalin. Does  use occasional Sonata Weight up 16 pounds in the past year. Surgical paniculectomy this summer.  ROS-see HPI Constitutional:   No-weight loss,  No-night sweats, fevers, chills, fatigue, lassitude. HEENT:   No-  headaches, difficulty swallowing, tooth/dental problems, sore throat,       No-  sneezing, itching, ear ache, nasal congestion, post nasal drip,  CV:  No-   chest pain, orthopnea, PND, swelling in lower extremities, anasarca, dizziness, palpitations Resp: No-   shortness of breath with exertion or at rest.              No-   productive cough,  No non-productive cough,  No- coughing up of blood.           Skin: No-   rash or lesions. GI:  No-   heartburn, indigestion, abdominal pain, nausea, vomiting,  GU:  MS:  No-   joint pain or swelling.  Neuro-     nothing unusual Psych:  No- change in mood or affect. No depression or anxiety.  No memory loss.    Objective:  OBJ- Physical Exam General- Alert, Oriented, Affect-appropriate, Distress- none acute, tired looking, mild overweight Skin- rash-none, lesions- none, excoriation- none. Pale complexion Lymphadenopathy- none Head- atraumatic            Eyes- Gross vision intact, PERRLA, conjunctivae and secretions clear            Ears- Hearing, canals-normal  Nose- Clear, no-Septal dev, mucus, polyps, erosion, perforation             Throat- Mallampati II , mucosa clear , drainage- none, tonsils- atrophic Neck- flexible , trachea midline, no stridor , thyroid nl, carotid no bruit Chest - symmetrical excursion , unlabored           Heart/CV- slow RRR , no murmur , no gallop  , no rub, nl s1 s2                           - JVD- none , edema- none, stasis changes- none, varices- none           Lung- clear to P&A, wheeze- none, cough- none , dullness-none, rub- none           Chest wall-  Abd- Br/ Gen/ Rectal- Not done, not indicated Extrem- cyanosis- none, clubbing, none, atrophy- none, strength- nl Neuro- grossly intact to  observation

## 2012-10-19 ENCOUNTER — Encounter: Payer: Self-pay | Admitting: Internal Medicine

## 2012-10-20 ENCOUNTER — Encounter: Payer: Self-pay | Admitting: Internal Medicine

## 2012-10-20 NOTE — Assessment & Plan Note (Signed)
I'm afraid he is gradually losing ground on the obesity  He has not found need CPAP since weight loss. This may change. We discussed the impact of weight on sleep apnea and his symptoms. For now, there is nothing more for me to do and I will see him as needed

## 2013-02-11 ENCOUNTER — Encounter (HOSPITAL_BASED_OUTPATIENT_CLINIC_OR_DEPARTMENT_OTHER): Payer: 59 | Attending: General Surgery

## 2013-02-11 ENCOUNTER — Ambulatory Visit (HOSPITAL_COMMUNITY)
Admission: RE | Admit: 2013-02-11 | Discharge: 2013-02-11 | Disposition: A | Discharge status: Home / Self Care | Payer: 59 | Source: Ambulatory Visit | Attending: General Surgery | Admitting: General Surgery

## 2013-02-11 ENCOUNTER — Other Ambulatory Visit (HOSPITAL_BASED_OUTPATIENT_CLINIC_OR_DEPARTMENT_OTHER): Payer: Self-pay | Admitting: General Surgery

## 2013-02-11 ENCOUNTER — Other Ambulatory Visit (HOSPITAL_COMMUNITY): Payer: Self-pay | Admitting: General Surgery

## 2013-02-11 DIAGNOSIS — M949 Disorder of cartilage, unspecified: Secondary | ICD-10-CM

## 2013-02-11 DIAGNOSIS — E669 Obesity, unspecified: Secondary | ICD-10-CM | POA: Insufficient documentation

## 2013-02-11 DIAGNOSIS — E039 Hypothyroidism, unspecified: Secondary | ICD-10-CM | POA: Insufficient documentation

## 2013-02-11 DIAGNOSIS — M7989 Other specified soft tissue disorders: Secondary | ICD-10-CM | POA: Insufficient documentation

## 2013-02-11 DIAGNOSIS — Z9884 Bariatric surgery status: Secondary | ICD-10-CM | POA: Insufficient documentation

## 2013-02-11 DIAGNOSIS — E785 Hyperlipidemia, unspecified: Secondary | ICD-10-CM | POA: Insufficient documentation

## 2013-02-11 DIAGNOSIS — I10 Essential (primary) hypertension: Secondary | ICD-10-CM | POA: Insufficient documentation

## 2013-02-11 DIAGNOSIS — L97509 Non-pressure chronic ulcer of other part of unspecified foot with unspecified severity: Secondary | ICD-10-CM | POA: Insufficient documentation

## 2013-02-11 DIAGNOSIS — Z7982 Long term (current) use of aspirin: Secondary | ICD-10-CM | POA: Insufficient documentation

## 2013-02-11 DIAGNOSIS — G4730 Sleep apnea, unspecified: Secondary | ICD-10-CM | POA: Insufficient documentation

## 2013-02-11 DIAGNOSIS — M869 Osteomyelitis, unspecified: Secondary | ICD-10-CM

## 2013-02-11 DIAGNOSIS — E1169 Type 2 diabetes mellitus with other specified complication: Secondary | ICD-10-CM | POA: Insufficient documentation

## 2013-02-11 DIAGNOSIS — Z79899 Other long term (current) drug therapy: Secondary | ICD-10-CM | POA: Insufficient documentation

## 2013-02-11 DIAGNOSIS — I2510 Atherosclerotic heart disease of native coronary artery without angina pectoris: Secondary | ICD-10-CM | POA: Insufficient documentation

## 2013-02-11 DIAGNOSIS — G589 Mononeuropathy, unspecified: Secondary | ICD-10-CM | POA: Insufficient documentation

## 2013-02-11 DIAGNOSIS — M899 Disorder of bone, unspecified: Secondary | ICD-10-CM | POA: Insufficient documentation

## 2013-02-11 DIAGNOSIS — I739 Peripheral vascular disease, unspecified: Secondary | ICD-10-CM

## 2013-02-11 NOTE — Progress Notes (Signed)
[facility]  NAME:  [name], [name]                   ACCOUNT NO.:  [id number]  MEDICAL RECORD NO.:  [id number]      DATE OF BIRTH:  Oct 11, 1950  PHYSICIAN:  Elesa Hacker, M.D.              VISIT DATE:                                  OFFICE VISIT   ADDENDUM:  In 2007, the patient developed a wound on the plantar surface of the left foot, developed osteomyelitis, and has undergone amputation of the left second toe.     Elesa Hacker, M.D.     RA/MEDQ  D:  02/11/2013  T:  02/11/2013  Job:  [id number]

## 2013-02-11 NOTE — H&P (Signed)
[name], [name]                   ACCOUNT NO.:  [id number]  MEDICAL RECORD NO.:  [id number]  LOCATION:  FOOT                         FACILITY:  [facility]  PHYSICIAN:  Elesa Hacker, M.D.        DATE OF BIRTH:  September 20, 1950  DATE OF ADMISSION:  02/11/2013 DATE OF DISCHARGE:                             HISTORY & PHYSICAL   CHIEF COMPLAINT:  Sore, right great toe.  HISTORY OF PRESENT ILLNESS:  This is a 63 year old, diabetic male, now on no diabetic medications.  He developed a wound on the plantar surface of his right great toe approximately 2 weeks ago.  He has been treated with Bactrim with a diagnosis of possible cellulitis.  He is since with the Bactrim.  PAST MEDICAL HISTORY:  Significant for hypertension, profound neuropathy, diabetes, arteriosclerotic cardiovascular disease, sleep apnea, hyperlipidemia, hypothyroidism, DJD right ankle, anemia, erectile dysfunction, and obesity.  PAST SURGICAL HISTORY:  Includes multiple stents for coronary artery disease.  Right knee arthroscopy in 1994, Lasix surgery, gastric bypass and hiatal hernia repair in 2012, and panniculectomy in 2014.  SOCIAL HISTORY:  Cigarettes, none.  Alcohol, occasionally.  MEDICATIONS:  Aspirin, Lipitor, Synthroid, Cialis, Sonata, pyrazole, sulfamethoxazole which is now done, multiple vitamins.  ALLERGIES:  PENICILLIN, AVELOX.  REVIEW OF SYSTEMS:  He appears to have had some trauma to his right foot and probably developed Charcot foot at that time.  PHYSICAL EXAMINATION:  VITAL SIGNS:  Temperature 98.4, pulse 50, respirations 17, blood pressure 129/72. GENERAL APPEARANCE:  Well developed, slender, in no distress. CRANIUM:  Normocephalic. CHEST:  Clear. HEART:  Regular rhythm. ABDOMEN:  Not examined. EXTREMITIES:  Examination of the right lower extremity reveals no pulses palpable in the foot.  ABI is measured at 1.34.  There is a bounding pulse in the left foot.  On plantar surface of the great toe, there is  a 0.6 x 0.5 x 0.1 ulceration which is fairly clean.  The patient has evidence of Charcot foot with deformity at the ankle and on the plantar surface of the foot.  Testing reveals profound neuropathy of both feet.  IMPRESSION:  Diabetic foot ulcer, appears to be Wagner 2.  PLAN:  We will start with arterial studies, silver collagen application, and x-ray of the foot to rule out osteo.  He will probably be a candidate for easy casting.  We will see him in 7 days.     Elesa Hacker, M.D.     RA/MEDQ  D:  02/11/2013  T:  02/11/2013  Job:  [id number]  cc:   Wenda Low, MD

## 2013-02-12 ENCOUNTER — Ambulatory Visit (HOSPITAL_COMMUNITY)
Admission: RE | Admit: 2013-02-12 | Discharge: 2013-02-12 | Disposition: A | Discharge status: Home / Self Care | Payer: 59 | Source: Ambulatory Visit | Attending: Cardiovascular Disease | Admitting: Cardiovascular Disease

## 2013-02-12 DIAGNOSIS — L97909 Non-pressure chronic ulcer of unspecified part of unspecified lower leg with unspecified severity: Secondary | ICD-10-CM | POA: Insufficient documentation

## 2013-02-12 DIAGNOSIS — I739 Peripheral vascular disease, unspecified: Secondary | ICD-10-CM

## 2013-02-12 NOTE — Progress Notes (Signed)
Lower Extremity Arterial Duplex Completed. No evidence for arterial insufficiency. °Brianna L Mazza,RVT °

## 2013-02-25 ENCOUNTER — Encounter (HOSPITAL_BASED_OUTPATIENT_CLINIC_OR_DEPARTMENT_OTHER): Payer: 59 | Attending: General Surgery

## 2013-02-25 DIAGNOSIS — L84 Corns and callosities: Secondary | ICD-10-CM | POA: Insufficient documentation

## 2013-02-25 DIAGNOSIS — E1169 Type 2 diabetes mellitus with other specified complication: Secondary | ICD-10-CM | POA: Insufficient documentation

## 2013-02-25 DIAGNOSIS — E1149 Type 2 diabetes mellitus with other diabetic neurological complication: Secondary | ICD-10-CM | POA: Insufficient documentation

## 2013-02-25 DIAGNOSIS — L97509 Non-pressure chronic ulcer of other part of unspecified foot with unspecified severity: Secondary | ICD-10-CM | POA: Insufficient documentation

## 2013-03-25 ENCOUNTER — Encounter (HOSPITAL_BASED_OUTPATIENT_CLINIC_OR_DEPARTMENT_OTHER): Payer: 59 | Attending: General Surgery

## 2013-03-25 DIAGNOSIS — L84 Corns and callosities: Secondary | ICD-10-CM | POA: Insufficient documentation

## 2013-03-25 DIAGNOSIS — L97509 Non-pressure chronic ulcer of other part of unspecified foot with unspecified severity: Secondary | ICD-10-CM | POA: Insufficient documentation

## 2013-03-25 DIAGNOSIS — E1169 Type 2 diabetes mellitus with other specified complication: Secondary | ICD-10-CM | POA: Insufficient documentation

## 2013-05-28 ENCOUNTER — Encounter: Payer: Self-pay | Admitting: Cardiology

## 2013-07-18 ENCOUNTER — Ambulatory Visit: Payer: BC Managed Care – PPO | Admitting: Cardiology

## 2013-07-29 ENCOUNTER — Ambulatory Visit (INDEPENDENT_AMBULATORY_CARE_PROVIDER_SITE_OTHER): Payer: 59 | Admitting: Cardiology

## 2013-07-29 ENCOUNTER — Encounter: Payer: Self-pay | Admitting: Cardiology

## 2013-07-29 VITALS — BP 110/72 | HR 57 | Ht >= 80 in | Wt 249.0 lb

## 2013-07-29 DIAGNOSIS — E782 Mixed hyperlipidemia: Secondary | ICD-10-CM

## 2013-07-29 DIAGNOSIS — I452 Bifascicular block: Secondary | ICD-10-CM

## 2013-07-29 DIAGNOSIS — I2510 Atherosclerotic heart disease of native coronary artery without angina pectoris: Secondary | ICD-10-CM | POA: Insufficient documentation

## 2013-07-29 DIAGNOSIS — I252 Old myocardial infarction: Secondary | ICD-10-CM

## 2013-07-29 DIAGNOSIS — I453 Trifascicular block: Secondary | ICD-10-CM | POA: Insufficient documentation

## 2013-07-29 NOTE — Progress Notes (Signed)
[name] [address] [place], [place]  [address] Phone: [phone] Fax:  [phone]  Date:  07/29/2013   ID:  [name], DOB 10-08-1950, MRN [id number]  PCP:  Lucas Shelling, MD   History of Present Illness: [name] is a 63 y.o. male with coronary artery disease former patient of Dr. [name] with prior stent to ramus in 2006, proximal LAD. His distal RCA was diffusely diseased. There was a fixed inferior defect in 2012 on nuclear stress test. Also has diabetes hypertension hyperlipidemia and sleep apnea. On October 10, 2010 he underwent gastric bypass surgery by Dr. [name] Done. Excellent weight loss of approximately 130 pounds. He is off of his diabetic medications, he has decreased his Lipitor. He is not on any blood pressure medications.  He continues to have first degree A-V block, bifascicular block, sinus bradycardia. This has been present for several years. No high-risk symptoms such as syncope, significant shortness of breath or angina with exercise. He is to continue to monitor this and that it may result in a pacemaker in the future. During surgery, his heart rate was in the 30s at times. Anesthesiologist noted this. I observed another EKG showing heart rate of 48 with bifascicular block. Once again no high-risk symptoms. We have been watching this.  Dr. Laurann Montana has placed him on iron therapy. He will likely be getting a colonoscopy in 2016. He states that he has had a heart murmur since childhood, mild mitral regurgitation on last echocardiogram in 2011. He is walking to a half miles almost daily. Doing well, no change in symptoms.    Wt Readings from Last 3 Encounters:  07/29/13 249 lb (112.946 kg)  10/10/12 251 lb 12.8 oz (114.216 kg)  10/10/12 254 lb 12.8 oz (115.577 kg)     Past Medical History  Diagnosis Date  . Hyperlipidemia   . Heart attack 06/2004  . Coronary artery disease   . Hypothyroidism   . GERD (gastroesophageal reflux disease)   . Diabetes  mellitus     diet-controlled  . Sleep apnea     sleep study 03/29/2011; no CPAP use  . Mucoid cyst of joint 09/2011    left thumb  . Hx MRSA infection 02/2006  . Dental crowns present   . Hypertension     hx. of - has not been on med. since losing wt. after gastric bypass  . Bifascicular block   . First degree AV block   . Heart murmur     aortic    Past Surgical History  Procedure Laterality Date  . Roux-en-y gastric bypass  10/10/2010    laparoscopic  . Toe amputation  02/23/2006    left foot second ray amputation  . Coronary angioplasty with stent placement  07/12/2004; 08/03/2004    total of 3 stents  . Cataract extraction  02/2010; 03/2010  . Mass excision  10/04/2011    Procedure: EXCISION MASS;  Surgeon: Lucas Sours, MD;  Location: [place] [facility];  Service: Orthopedics;  Laterality: Left;  excision cyst debridment ip joint of left thumb    Current Outpatient Prescriptions  Medication Sig Dispense Refill  . aspirin 81 MG tablet Take 81 mg by mouth daily.        Marland Kitchen atorvastatin (LIPITOR) 10 MG tablet Take 10 mg by mouth daily.      . Calcium Carbonate (CALCIUM 500 PO) Take 3 tablets by mouth daily.      . Cyanocobalamin (B-12) 500  MCG SUBL Place under the tongue daily.      . Iron Combinations (IRON COMPLEX PO) Take 65 mg by mouth.      . levothyroxine (SYNTHROID, LEVOTHROID) 175 MCG tablet Take 175 mcg by mouth daily before breakfast.      . Multiple Vitamins-Minerals (MULTIVITAMIN PO) Take 2 tablets by mouth daily. Chewable tablet      . OMEGA 3 1000 MG CAPS Take 1 capsule by mouth daily.       . Omeprazole (PRILOSEC PO) Take 1 tablet by mouth every other day.       . Polyethylene Glycol 3350 (MIRALAX PO) Take 1 packet by mouth daily.      . Probiotic Product (PROBIOTIC FORMULA PO) Take by mouth.      . tadalafil (CIALIS) 20 MG tablet Take 20 mg by mouth daily as needed.        . Wheat Dextrin (BENEFIBER PO) Take 1 each by mouth daily.      . zaleplon (SONATA) 10 MG  capsule Take 1 capsule (10 mg total) by mouth at bedtime as needed.  30 capsule  prn   No current facility-administered medications for this visit.    Allergies:    Allergies  Allergen Reactions  . Moxifloxacin Rash  . Penicillins Rash    Social History:  The patient  reports that he has never smoked. He has never used smokeless tobacco. He reports that he does not drink alcohol or use illicit drugs.   No family history on file.  ROS:  Please see the history of present illness.   Denies any fevers, chills, orthopnea, PND   All other systems reviewed and negative.   PHYSICAL EXAM: VS:  BP 110/72  Pulse 57  Ht 6\' 8"  (2.032 m)  Wt 249 lb (112.946 kg)  BMI 27.35 kg/m2 Well nourished, well developed, in no acute distress HEENT: normal, [place]/AT, EOMI Neck: no JVD, normal carotid upstroke, no bruit Cardiac:  normal S1, S2; RRR; 2/6 holosystolic apical murmur Lungs:  clear to auscultation bilaterally, no wheezing, rhonchi or rales Abd: soft, nontender, no hepatomegaly, no bruits Ext: no edema, 2+ distal pulses Skin: warm and dry GU: deferred Neuro: no focal abnormalities noted, AAO x 3  EKG:  07/29/13-sinus rhythm, first degree AV block 242 ms, right bundle branch block, left anterior fascicular block, bifascicular block-no change from prior   Nuclear stress test-2012-low risk, fixed inferior wall defect. No ischemia. Echocardiogram 2011-normal EF, mild MR, aortic calcification  Labs-3/15-LDL 63  ASSESSMENT AND PLAN:  1. Coronary artery disease-previous stent to ramus, LAD proximal, diffusely diseased RCA. Continuing with current aggressive medical management. Aspirin. No anginal symptoms. Currently not on beta blocker because of this and bifascicular block. Last nuclear stress test as above, low risk with no ischemia. 2. Old myocardial infarction-inferior. 3. Hyperlipidemia-currently on low-dose Lipitor because of significantly low LDL. LDL 63. 4. Bifascicular block-no high-risk  symptoms such as syncope. Avoiding AV nodal blocking agents. 5. Heart murmur-states that he is had a murmur since childhood-prior mitral regurgitation-mild 6. Annual followup  Signed, Candee Furbish, MD [facility]  07/29/2013 8:26 AM

## 2013-07-29 NOTE — Patient Instructions (Signed)
The current medical regimen is effective;  continue present plan and medications.  Follow up in 1 year with Dr Skains.  You will receive a letter in the mail 2 months before you are due.  Please call us when you receive this letter to schedule your follow up appointment.  

## 2014-05-26 ENCOUNTER — Ambulatory Visit
Admission: RE | Admit: 2014-05-26 | Discharge: 2014-05-26 | Disposition: A | Discharge status: Home / Self Care | Payer: BLUE CROSS/BLUE SHIELD | Source: Ambulatory Visit | Attending: Internal Medicine | Admitting: Internal Medicine

## 2014-05-26 ENCOUNTER — Other Ambulatory Visit: Payer: Self-pay | Admitting: Internal Medicine

## 2014-05-26 DIAGNOSIS — M25512 Pain in left shoulder: Secondary | ICD-10-CM

## 2014-06-01 DIAGNOSIS — E1140 Type 2 diabetes mellitus with diabetic neuropathy, unspecified: Secondary | ICD-10-CM | POA: Insufficient documentation

## 2014-06-01 DIAGNOSIS — I10 Essential (primary) hypertension: Secondary | ICD-10-CM | POA: Insufficient documentation

## 2014-06-03 ENCOUNTER — Emergency Department (HOSPITAL_BASED_OUTPATIENT_CLINIC_OR_DEPARTMENT_OTHER): Payer: BLUE CROSS/BLUE SHIELD

## 2014-06-03 ENCOUNTER — Inpatient Hospital Stay (HOSPITAL_BASED_OUTPATIENT_CLINIC_OR_DEPARTMENT_OTHER)
Admission: EM | Admit: 2014-06-03 | Discharge: 2014-06-08 | DRG: 623 | Disposition: A | Discharge status: Home / Self Care | Payer: BLUE CROSS/BLUE SHIELD | Attending: Internal Medicine | Admitting: Internal Medicine

## 2014-06-03 ENCOUNTER — Other Ambulatory Visit: Payer: Self-pay | Admitting: Gastroenterology

## 2014-06-03 ENCOUNTER — Encounter (HOSPITAL_BASED_OUTPATIENT_CLINIC_OR_DEPARTMENT_OTHER): Payer: Self-pay

## 2014-06-03 DIAGNOSIS — G4730 Sleep apnea, unspecified: Secondary | ICD-10-CM | POA: Diagnosis present

## 2014-06-03 DIAGNOSIS — I70234 Atherosclerosis of native arteries of right leg with ulceration of heel and midfoot: Secondary | ICD-10-CM | POA: Diagnosis present

## 2014-06-03 DIAGNOSIS — E778 Other disorders of glycoprotein metabolism: Secondary | ICD-10-CM | POA: Diagnosis not present

## 2014-06-03 DIAGNOSIS — L03115 Cellulitis of right lower limb: Secondary | ICD-10-CM | POA: Diagnosis present

## 2014-06-03 DIAGNOSIS — Z7982 Long term (current) use of aspirin: Secondary | ICD-10-CM

## 2014-06-03 DIAGNOSIS — E1169 Type 2 diabetes mellitus with other specified complication: Secondary | ICD-10-CM | POA: Diagnosis not present

## 2014-06-03 DIAGNOSIS — E871 Hypo-osmolality and hyponatremia: Secondary | ICD-10-CM | POA: Diagnosis not present

## 2014-06-03 DIAGNOSIS — I2510 Atherosclerotic heart disease of native coronary artery without angina pectoris: Secondary | ICD-10-CM | POA: Diagnosis present

## 2014-06-03 DIAGNOSIS — E1140 Type 2 diabetes mellitus with diabetic neuropathy, unspecified: Secondary | ICD-10-CM | POA: Diagnosis present

## 2014-06-03 DIAGNOSIS — L97519 Non-pressure chronic ulcer of other part of right foot with unspecified severity: Secondary | ICD-10-CM | POA: Diagnosis present

## 2014-06-03 DIAGNOSIS — L02611 Cutaneous abscess of right foot: Secondary | ICD-10-CM | POA: Diagnosis present

## 2014-06-03 DIAGNOSIS — E11621 Type 2 diabetes mellitus with foot ulcer: Secondary | ICD-10-CM | POA: Diagnosis present

## 2014-06-03 DIAGNOSIS — E1151 Type 2 diabetes mellitus with diabetic peripheral angiopathy without gangrene: Secondary | ICD-10-CM | POA: Diagnosis present

## 2014-06-03 DIAGNOSIS — E11628 Type 2 diabetes mellitus with other skin complications: Secondary | ICD-10-CM | POA: Diagnosis present

## 2014-06-03 DIAGNOSIS — L97509 Non-pressure chronic ulcer of other part of unspecified foot with unspecified severity: Secondary | ICD-10-CM

## 2014-06-03 DIAGNOSIS — I252 Old myocardial infarction: Secondary | ICD-10-CM | POA: Diagnosis not present

## 2014-06-03 DIAGNOSIS — L089 Local infection of the skin and subcutaneous tissue, unspecified: Secondary | ICD-10-CM

## 2014-06-03 DIAGNOSIS — E785 Hyperlipidemia, unspecified: Secondary | ICD-10-CM | POA: Diagnosis present

## 2014-06-03 DIAGNOSIS — K219 Gastro-esophageal reflux disease without esophagitis: Secondary | ICD-10-CM | POA: Diagnosis present

## 2014-06-03 DIAGNOSIS — Z955 Presence of coronary angioplasty implant and graft: Secondary | ICD-10-CM | POA: Diagnosis not present

## 2014-06-03 DIAGNOSIS — A5216 Charcot's arthropathy (tabetic): Secondary | ICD-10-CM | POA: Diagnosis present

## 2014-06-03 DIAGNOSIS — L02619 Cutaneous abscess of unspecified foot: Secondary | ICD-10-CM

## 2014-06-03 DIAGNOSIS — Z880 Allergy status to penicillin: Secondary | ICD-10-CM

## 2014-06-03 DIAGNOSIS — E039 Hypothyroidism, unspecified: Secondary | ICD-10-CM

## 2014-06-03 DIAGNOSIS — M79671 Pain in right foot: Secondary | ICD-10-CM | POA: Diagnosis present

## 2014-06-03 DIAGNOSIS — Z9884 Bariatric surgery status: Secondary | ICD-10-CM

## 2014-06-03 DIAGNOSIS — R739 Hyperglycemia, unspecified: Secondary | ICD-10-CM

## 2014-06-03 DIAGNOSIS — I739 Peripheral vascular disease, unspecified: Secondary | ICD-10-CM

## 2014-06-03 LAB — COMPREHENSIVE METABOLIC PANEL
ALT: 12 U/L — ABNORMAL LOW (ref 17–63)
AST: 15 U/L (ref 15–41)
Albumin: 3.6 g/dL (ref 3.5–5.0)
Alkaline Phosphatase: 64 U/L (ref 38–126)
Anion gap: 9 (ref 5–15)
BUN: 24 mg/dL — ABNORMAL HIGH (ref 6–20)
CO2: 28 mmol/L (ref 22–32)
Calcium: 9.1 mg/dL (ref 8.9–10.3)
Chloride: 102 mmol/L (ref 101–111)
Creatinine, Ser: 0.92 mg/dL (ref 0.61–1.24)
GFR calc Af Amer: >60 mL/min (ref >60)
GFR calc non Af Amer: >60 mL/min (ref >60)
Glucose, Bld: 176 mg/dL — ABNORMAL HIGH (ref 70–99)
Potassium: 4.5 mmol/L (ref 3.5–5.1)
Sodium: 139 mmol/L (ref 135–145)
Total Bilirubin: 1.3 mg/dL — ABNORMAL HIGH (ref 0.3–1.2)
Total Protein: 7.3 g/dL (ref 6.5–8.1)

## 2014-06-03 LAB — PROTIME-INR
INR: 1.21 (ref 0.00–1.49)
Prothrombin Time: 15.3 seconds — ABNORMAL HIGH (ref 11.6–15.2)

## 2014-06-03 LAB — CBC WITH DIFFERENTIAL/PLATELET
Basophils Absolute: 0 10*3/uL (ref 0.0–0.1)
Basophils Relative: 0 % (ref 0–1)
Eosinophils Absolute: 0 10*3/uL (ref 0.0–0.7)
Eosinophils Relative: 0 % (ref 0–5)
HCT: 42.1 % (ref 39.0–52.0)
Hemoglobin: 14.5 g/dL (ref 13.0–17.0)
Lymphocytes Relative: 5 % — ABNORMAL LOW (ref 12–46)
Lymphs Abs: 0.7 10*3/uL (ref 0.7–4.0)
MCH: 31.9 pg (ref 26.0–34.0)
MCHC: 34.4 g/dL (ref 30.0–36.0)
MCV: 92.5 fL (ref 78.0–100.0)
Monocytes Absolute: 1 10*3/uL (ref 0.1–1.0)
Monocytes Relative: 8 % (ref 3–12)
Neutro Abs: 10.9 10*3/uL — ABNORMAL HIGH (ref 1.7–7.7)
Neutrophils Relative %: 87 % — ABNORMAL HIGH (ref 43–77)
Platelets: 183 10*3/uL (ref 150–400)
RBC: 4.55 MIL/uL (ref 4.22–5.81)
RDW: 13.1 % (ref 11.5–15.5)
WBC: 12.6 10*3/uL — ABNORMAL HIGH (ref 4.0–10.5)

## 2014-06-03 LAB — I-STAT CG4 LACTIC ACID, ED: Lactic Acid, Venous: 1.58 mmol/L (ref 0.5–2.0)

## 2014-06-03 LAB — GLUCOSE, CAPILLARY: Glucose-Capillary: 175 mg/dL — ABNORMAL HIGH (ref 70–99)

## 2014-06-03 MED ORDER — POLYETHYLENE GLYCOL 3350 17 G PO PACK: 17.0000 g | PACK | Freq: Every day | ORAL | Status: DC | PRN

## 2014-06-03 MED ORDER — VANCOMYCIN HCL IN DEXTROSE 1-5 GM/200ML-% IV SOLN
1000.0000 mg | Freq: Once | INTRAVENOUS | Status: DC
  Filled 2014-06-03: qty 200

## 2014-06-03 MED ORDER — SODIUM CHLORIDE 0.9 % IV SOLN
INTRAVENOUS | Status: DC
  Administered 2014-06-03: 18:00:00 via INTRAVENOUS

## 2014-06-03 MED ORDER — ASPIRIN EC 81 MG PO TBEC
81.0000 mg | DELAYED_RELEASE_TABLET | Freq: Every day | ORAL | Status: DC
  Administered 2014-06-03 – 2014-06-08 (×6): 81 mg via ORAL
  Filled 2014-06-03 (×6): qty 1

## 2014-06-03 MED ORDER — MORPHINE SULFATE 2 MG/ML IJ SOLN: 1.0000 mg | INTRAMUSCULAR | Status: DC | PRN

## 2014-06-03 MED ORDER — VANCOMYCIN HCL IN DEXTROSE 750-5 MG/150ML-% IV SOLN
750.0000 mg | Freq: Two times a day (BID) | INTRAVENOUS | Status: DC
  Filled 2014-06-03: qty 150

## 2014-06-03 MED ORDER — ONDANSETRON HCL 4 MG/2ML IJ SOLN: 4.0000 mg | Freq: Four times a day (QID) | INTRAMUSCULAR | Status: DC | PRN

## 2014-06-03 MED ORDER — ACETAMINOPHEN 650 MG RE SUPP
650.0000 mg | Freq: Four times a day (QID) | RECTAL | Status: DC | PRN
Start: 2014-06-03 — End: 2014-06-08

## 2014-06-03 MED ORDER — ONDANSETRON HCL 4 MG PO TABS: 4.0000 mg | ORAL_TABLET | Freq: Four times a day (QID) | ORAL | Status: DC | PRN

## 2014-06-03 MED ORDER — OXYCODONE HCL 5 MG PO TABS: 5.0000 mg | ORAL_TABLET | ORAL | Status: DC | PRN

## 2014-06-03 MED ORDER — VANCOMYCIN HCL IN DEXTROSE 750-5 MG/150ML-% IV SOLN
750.0000 mg | Freq: Two times a day (BID) | INTRAVENOUS | Status: DC
  Administered 2014-06-04 – 2014-06-06 (×6): 750 mg via INTRAVENOUS
  Filled 2014-06-03 (×7): qty 150

## 2014-06-03 MED ORDER — LEVOTHYROXINE SODIUM 100 MCG PO TABS
200.0000 ug | ORAL_TABLET | Freq: Every day | ORAL | Status: DC
  Administered 2014-06-04 – 2014-06-08 (×5): 200 ug via ORAL
  Filled 2014-06-03 (×5): qty 2

## 2014-06-03 MED ORDER — ASPIRIN 81 MG PO TABS
81.0000 mg | ORAL_TABLET | Freq: Every day | ORAL | Status: DC
  Filled 2014-06-03: qty 1

## 2014-06-03 MED ORDER — METRONIDAZOLE IN NACL 5-0.79 MG/ML-% IV SOLN
500.0000 mg | Freq: Once | INTRAVENOUS | Status: AC
  Administered 2014-06-03: 500 mg via INTRAVENOUS
  Filled 2014-06-03: qty 100

## 2014-06-03 MED ORDER — DEXTROSE 5 % IV SOLN
2.0000 g | Freq: Three times a day (TID) | INTRAVENOUS | Status: DC
  Administered 2014-06-03 – 2014-06-08 (×15): 2 g via INTRAVENOUS
  Filled 2014-06-03 (×16): qty 2

## 2014-06-03 MED ORDER — VANCOMYCIN HCL IN DEXTROSE 1-5 GM/200ML-% IV SOLN
1000.0000 mg | Freq: Once | INTRAVENOUS | Status: AC
  Administered 2014-06-03: 1000 mg via INTRAVENOUS
  Filled 2014-06-03: qty 200

## 2014-06-03 MED ORDER — CYCLOBENZAPRINE HCL 10 MG PO TABS: 10.0000 mg | ORAL_TABLET | Freq: Three times a day (TID) | ORAL | Status: DC | PRN

## 2014-06-03 MED ORDER — ATORVASTATIN CALCIUM 10 MG PO TABS
10.0000 mg | ORAL_TABLET | Freq: Every day | ORAL | Status: DC
  Administered 2014-06-05 – 2014-06-07 (×3): 10 mg via ORAL
  Filled 2014-06-03 (×3): qty 1

## 2014-06-03 MED ORDER — ACETAMINOPHEN 325 MG PO TABS: 650.0000 mg | ORAL_TABLET | Freq: Four times a day (QID) | ORAL | Status: DC | PRN

## 2014-06-03 MED ORDER — PANTOPRAZOLE SODIUM 40 MG PO TBEC
40.0000 mg | DELAYED_RELEASE_TABLET | Freq: Every day | ORAL | Status: DC
  Administered 2014-06-04 – 2014-06-08 (×6): 40 mg via ORAL
  Filled 2014-06-03 (×7): qty 1

## 2014-06-03 MED ORDER — INSULIN ASPART 100 UNIT/ML ~~LOC~~ SOLN
0.0000 [IU] | Freq: Three times a day (TID) | SUBCUTANEOUS | Status: DC
  Administered 2014-06-04: 2 [IU] via SUBCUTANEOUS
  Administered 2014-06-04: 1 [IU] via SUBCUTANEOUS
  Administered 2014-06-05: 2 [IU] via SUBCUTANEOUS
  Administered 2014-06-05: 1 [IU] via SUBCUTANEOUS
  Administered 2014-06-05: 2 [IU] via SUBCUTANEOUS
  Administered 2014-06-06 (×2): 1 [IU] via SUBCUTANEOUS
  Administered 2014-06-06 – 2014-06-08 (×3): 2 [IU] via SUBCUTANEOUS

## 2014-06-03 MED ORDER — SODIUM CHLORIDE 0.9 % IV SOLN
1.0000 g | Freq: Three times a day (TID) | INTRAVENOUS | Status: DC
  Filled 2014-06-03: qty 1

## 2014-06-03 MED ORDER — SODIUM CHLORIDE 0.9 % IV SOLN
INTRAVENOUS | Status: DC
  Administered 2014-06-03: 75 mL/h via INTRAVENOUS
  Administered 2014-06-04 – 2014-06-05 (×3): via INTRAVENOUS

## 2014-06-03 NOTE — Progress Notes (Addendum)
ANTIBIOTIC CONSULT NOTE - INITIAL  Pharmacy Consult for Aztreonam Indication: wound infection  Allergies  Allergen Reactions  . Moxifloxacin Rash  . Penicillins Rash    Patient Measurements: Height: 6\' 8"  (203.2 cm) Weight: 242 lb (109.77 kg) IBW/kg (Calculated) : 96 Adjusted Body Weight:   Vital Signs: Temp: 98.7 F (37.1 C) (05/11 1422) Temp Source: Oral (05/11 1422) BP: 114/58 mmHg (05/11 1422) Pulse Rate: 60 (05/11 1422) Intake/Output from previous day:   Intake/Output from this shift:    Labs:  Recent Labs  06/03/14 1324  WBC 12.6*  HGB 14.5  PLT 183  CREATININE 0.92   Estimated Creatinine Clearance: 108.7 mL/min (by C-G formula based on Cr of 0.92). No results for input(s): VANCOTROUGH, VANCOPEAK, VANCORANDOM, GENTTROUGH, GENTPEAK, GENTRANDOM, TOBRATROUGH, TOBRAPEAK, TOBRARND, AMIKACINPEAK, AMIKACINTROU, AMIKACIN in the last 72 hours.   Microbiology: No results found for this or any previous visit (from the past 720 hour(s)).  Medical History: Past Medical History  Diagnosis Date  . Hyperlipidemia   . Heart attack 06/2004  . Coronary artery disease   . Hypothyroidism   . GERD (gastroesophageal reflux disease)   . Diabetes mellitus     diet-controlled  . Sleep apnea     sleep study 03/29/2011; no CPAP use  . Mucoid cyst of joint 09/2011    left thumb  . Hx MRSA infection 02/2006  . Dental crowns present   . Hypertension     hx. of - has not been on med. since losing wt. after gastric bypass  . Bifascicular block   . First degree AV block   . Heart murmur     aortic    Medications:   (Not in a hospital admission) Scheduled:   Infusions:  . vancomycin     Assessment: 64yo male presents to [facility] ED with pain and redness in the sole of his foot. He was seen at [facility] wound care 2 days ago for wound on right great toe. Pharmacy is consulted to dose aztreonam for wound infection. Pt has allergies to fluoroquinolones and penicillins. Pt is  afebrile, WBC 12.6, sCr 0.92.   Pt received vancomycin 1g IV once in the ED.  Goal of Therapy:  Eradication of infection  Plan:  Aztreonam 2g IV q8h  Follow up culture results, renal function, and clinical course  Andrey Cota. Diona Foley, PharmD Clinical Pharmacist Pager [phone] 06/03/2014,2:49 PM

## 2014-06-03 NOTE — Progress Notes (Signed)
ANTIBIOTIC CONSULT NOTE - INITIAL  Pharmacy Consult for vancomycin Indication: wound infection  Allergies  Allergen Reactions  . Quinolones Itching  . Bactrim [Sulfamethoxazole-Trimethoprim] Itching and Rash  . Moxifloxacin Rash  . Penicillin G Rash  . Penicillins Rash    Patient Measurements: Height: 6\' 8"  (203.2 cm) Weight: 248 lb (112.492 kg) IBW/kg (Calculated) : 96   Vital Signs: Temp: 99.5 F (37.5 C) (05/11 1731) Temp Source: Oral (05/11 1731) BP: 113/57 mmHg (05/11 1731) Pulse Rate: 67 (05/11 1731) Intake/Output from previous day:   Intake/Output from this shift:    Labs:  Recent Labs  06/03/14 1324  WBC 12.6*  HGB 14.5  PLT 183  CREATININE 0.92   Estimated Creatinine Clearance: 108.7 mL/min (by C-G formula based on Cr of 0.92). No results for input(s): VANCOTROUGH, VANCOPEAK, VANCORANDOM, GENTTROUGH, GENTPEAK, GENTRANDOM, TOBRATROUGH, TOBRAPEAK, TOBRARND, AMIKACINPEAK, AMIKACINTROU, AMIKACIN in the last 72 hours.   Microbiology: No results found for this or any previous visit (from the past 720 hour(s)).  Medical History: Past Medical History  Diagnosis Date  . Hyperlipidemia   . Heart attack 06/2004  . Coronary artery disease   . Hypothyroidism   . GERD (gastroesophageal reflux disease)   . Diabetes mellitus     diet-controlled  . Sleep apnea     sleep study 03/29/2011; no CPAP use  . Mucoid cyst of joint 09/2011    left thumb  . Hx MRSA infection 02/2006  . Dental crowns present   . Hypertension     hx. of - has not been on med. since losing wt. after gastric bypass  . Bifascicular block   . First degree AV block   . Heart murmur     aortic    Medications:  Prescriptions prior to admission  Medication Sig Dispense Refill Last Dose  . aspirin 81 MG tablet Take 81 mg by mouth daily.     06/02/2014 at Unknown time  . atorvastatin (LIPITOR) 10 MG tablet Take 10 mg by mouth daily at 6 PM.    06/02/2014 at Unknown time  . azithromycin  (ZITHROMAX) 250 MG tablet Take 250 mg by mouth daily.  0 06/03/2014 at Unknown time  . Calcium Carbonate (CALCIUM 500 PO) Take 3 tablets by mouth daily.   06/02/2014 at Unknown time  . Calcium Citrate 200 MG TABS Take 500 mg by mouth 3 (three) times daily. 500 mg tid   06/02/2014 at Unknown time  . Cyanocobalamin (B-12) 500 MCG SUBL Place 1,000 mcg under the tongue daily.    06/02/2014 at Unknown time  . fluticasone (FLONASE) 50 MCG/ACT nasal spray Place 2 sprays into both nostrils 2 (two) times daily.   06/02/2014 at Unknown time  . Iron Combinations (IRON COMPLEX PO) Take 65 mg by mouth daily.    06/02/2014 at Unknown time  . levothyroxine (SYNTHROID, LEVOTHROID) 200 MCG tablet Take 200 mcg by mouth daily.    06/03/2014 at Unknown time  . Multiple Vitamins-Minerals (MULTIVITAMIN PO) Take 2 tablets by mouth daily. Chewable tablet   06/02/2014 at Unknown time  . Multiple Vitamins-Minerals (OSTEO COMPLEX PO) SHAKLEE OSTEO MATRIX   06/02/2014 at Unknown time  . nitroGLYCERIN (NITROSTAT) 0.4 MG SL tablet Place 0.4 mg under the tongue every 5 (five) minutes x 3 doses as needed for chest pain.    never  . OMEGA 3 1000 MG CAPS Take 1 capsule by mouth daily.    06/02/2014 at Unknown time  . Omeprazole 20 MG TBEC Take 20 mg  by mouth every other day.   06/02/2014 at Unknown time  . Polyethylene Glycol 3350 (MIRALAX PO) Take 1 packet by mouth daily as needed (constipation).    06/02/2014 at Unknown time  . Probiotic Product (PROBIOTIC FORMULA PO) Take 1 tablet by mouth daily.    06/02/2014 at Unknown time  . SILDENAFIL CITRATE PO Take 1 tablet by mouth daily as needed (erectile dysfuntion).    2 weeks  . sodium chloride (OCEAN) 0.65 % SOLN nasal spray Place 1 spray into both nostrils as needed for congestion.   06/02/2014 at Unknown time  . sulfamethoxazole-trimethoprim (BACTRIM,SEPTRA) 400-80 MG per tablet Take 1 tablet by mouth daily.   0 06/02/2014 at Unknown time  . traMADol (ULTRAM) 50 MG tablet Take 50 mg by mouth  every 6 (six) hours as needed. For pain   Past Week at Unknown time  . Wheat Dextrin (BENEFIBER PO) Take 1 each by mouth daily as needed (for constipation).    06/03/2014 at Unknown time  . zaleplon (SONATA) 10 MG capsule Take 10 mg by mouth at bedtime.   2 MONTHS  . cyclobenzaprine (FLEXERIL) 10 MG tablet Take 10 mg by mouth 3 (three) times daily as needed for muscle spasms.   0 05/29/2014  . zaleplon (SONATA) 10 MG capsule Take 1 capsule (10 mg total) by mouth at bedtime as needed. 30 capsule prn Taking   Assessment: 64 yo man to start vancomycin for wound infection.  His CrCl ~75 ml/min.  He received one gram of vancomycin at 15:00.  Goal of Therapy:  Vancomycin trough level 10-15 mcg/ml  Plan:  Vancomycin 1gm IV now then 750 mg IV q12 hours Will f/u renal function, cultures and clinical course  Thanks for allowing pharmacy to be a part of this patient's care.  Excell Seltzer, PharmD Clinical Pharmacist, [phone]  06/03/2014,7:02 PM

## 2014-06-03 NOTE — Consult Note (Signed)
Reason for Consult:  Right foot abscess Referring Physician:   [place]  [name] is an 64 y.o. male.  HPI:   64 yo male diabetic that I have known for many years.  He does have a history of Charcot collapse of his right foot and a small wound by his right great toe.  He has been recently seen at [facility] and even had ABI's earlier this week.  Today he awoke with right foot and ankle swelling as redness as well as drainage from a planter ulcer that is new.  He presented to [facility] and the transferred to [facility] for IV antibiotics and definitive treatment of his right foot abscess.  Past Medical History  Diagnosis Date  . Hyperlipidemia   . Heart attack 06/2004  . Coronary artery disease   . Hypothyroidism   . GERD (gastroesophageal reflux disease)   . Diabetes mellitus     diet-controlled  . Sleep apnea     sleep study 03/29/2011; no CPAP use  . Mucoid cyst of joint 09/2011    left thumb  . Hx MRSA infection 02/2006  . Dental crowns present   . Hypertension     hx. of - has not been on med. since losing wt. after gastric bypass  . Bifascicular block   . First degree AV block   . Heart murmur     aortic    Past Surgical History  Procedure Laterality Date  . Roux-en-y gastric bypass  10/10/2010    laparoscopic  . Toe amputation  02/23/2006    left foot second ray amputation  . Coronary angioplasty with stent placement  07/12/2004; 08/03/2004    total of 3 stents  . Cataract extraction  02/2010; 03/2010  . Mass excision  10/04/2011    Procedure: EXCISION MASS;  Surgeon: Wynonia Sours, MD;  Location: [place];  Service: Orthopedics;  Laterality: Left;  excision cyst debridment ip joint of left thumb    No family history on file.  Social History:  reports that he has never smoked. He has never used smokeless tobacco. He reports that he does not drink alcohol or use illicit drugs.  Allergies:  Allergies  Allergen Reactions  .  Moxifloxacin Rash  . Penicillins Rash    Medications: I have reviewed the patient's current medications.  Results for orders placed or performed during the hospital encounter of 06/03/14 (from the past 48 hour(s))  Comprehensive metabolic panel     Status: Abnormal   Collection Time: 06/03/14  1:24 PM  Result Value Ref Range   Sodium 139 135 - 145 mmol/L   Potassium 4.5 3.5 - 5.1 mmol/L   Chloride 102 101 - 111 mmol/L   CO2 28 22 - 32 mmol/L   Glucose, Bld 176 (H) 70 - 99 mg/dL   BUN 24 (H) 6 - 20 mg/dL   Creatinine, Ser 0.92 0.61 - 1.24 mg/dL   Calcium 9.1 8.9 - 10.3 mg/dL   Total Protein 7.3 6.5 - 8.1 g/dL   Albumin 3.6 3.5 - 5.0 g/dL   AST 15 15 - 41 U/L   ALT 12 (L) 17 - 63 U/L   Alkaline Phosphatase 64 38 - 126 U/L   Total Bilirubin 1.3 (H) 0.3 - 1.2 mg/dL   GFR calc non Af Amer >60 >60 mL/min   GFR calc Af Amer >60 >60 mL/min    Comment: (NOTE) The eGFR has been calculated using  the CKD EPI equation. This calculation has not been validated in all clinical situations. eGFR's persistently <60 mL/min signify possible Chronic Kidney Disease.    Anion gap 9 5 - 15  CBC with Differential     Status: Abnormal   Collection Time: 06/03/14  1:24 PM  Result Value Ref Range   WBC 12.6 (H) 4.0 - 10.5 K/uL   RBC 4.55 4.22 - 5.81 MIL/uL   Hemoglobin 14.5 13.0 - 17.0 g/dL   HCT 42.1 39.0 - 52.0 %   MCV 92.5 78.0 - 100.0 fL   MCH 31.9 26.0 - 34.0 pg   MCHC 34.4 30.0 - 36.0 g/dL   RDW 13.1 11.5 - 15.5 %   Platelets 183 150 - 400 K/uL   Neutrophils Relative % 87 (H) 43 - 77 %   Neutro Abs 10.9 (H) 1.7 - 7.7 K/uL   Lymphocytes Relative 5 (L) 12 - 46 %   Lymphs Abs 0.7 0.7 - 4.0 K/uL   Monocytes Relative 8 3 - 12 %   Monocytes Absolute 1.0 0.1 - 1.0 K/uL   Eosinophils Relative 0 0 - 5 %   Eosinophils Absolute 0.0 0.0 - 0.7 K/uL   Basophils Relative 0 0 - 1 %   Basophils Absolute 0.0 0.0 - 0.1 K/uL  Protime-INR     Status: Abnormal   Collection Time: 06/03/14  1:24 PM  Result  Value Ref Range   Prothrombin Time 15.3 (H) 11.6 - 15.2 seconds   INR 1.21 0.00 - 1.49  I-Stat CG4 Lactic Acid, ED     Status: None   Collection Time: 06/03/14  1:33 PM  Result Value Ref Range   Lactic Acid, Venous 1.58 0.5 - 2.0 mmol/L    Dg Foot Complete Right  06/03/2014   CLINICAL DATA:  Plantar foot wound  EXAM: RIGHT FOOT COMPLETE - 3+ VIEW  COMPARISON:  02/11/2013  FINDINGS: Generalized soft tissue swelling is noted about the tarsal bones. Significant tarsal degenerative changes are noted but stable from the prior exam. These likely represent Charcot joints. Soft tissue prominence is noted in the plantar aspect of the foot consistent with the current history. Vascular calcifications are noted.  IMPRESSION: Soft tissue wound on the plantar aspect of the foot.  Chronic changes in the tarsal bones stable from the previous exam.   Electronically Signed   By: Inez Catalina M.D.   On: 06/03/2014 13:54    ROS Blood pressure 113/57, pulse 67, temperature 99.5 F (37.5 C), temperature source Oral, resp. rate 18, height '6\' 8"'  (2.032 m), weight 112.492 kg (248 lb), SpO2 98 %. Physical Exam  Musculoskeletal:       Feet:    Assessment/Plan: Right foot ulcer with abscess 1)  I have spoken to [name] in length and he understands the need to proceed with surgery late tomorrow for an irrigation and debridement.  Continue IV antibiotics for now.  I have written diet as well as NPO and consent orders.  [name] 06/03/2014, 6:02 PM

## 2014-06-03 NOTE — H&P (Signed)
[facility] History and Physical  [name] LXB:[id number] DOB: 07-29-1950 DOA: 06/03/2014   PCP: Irven Shelling, MD  Specialists: Dr. Candee Furbish is his cardiologist  Chief Complaint: Pain in the right foot  HPI: [name] is a 64 y.o. male with a past medical history of coronary artery disease with MI in 2006, requiring stent placement, previous history of diabetes, hypertension and sleep apnea, all of which apparently resolved after he underwent gastric bypass surgery in 2012, with which he lost about 125 pounds. Patient presented to the emergency department today due to worsening pain and swelling in his right foot. He tells me that he has a history of Charcot foot. Over the last few days it has been painful to walk and then he may have seen a blister which started oozing yellowish fluid. He had some fever and chills but did not check his temperature, just felt warm. He denies any history of nausea, vomiting, abdominal pain. The pain in his right foot is mainly with ambulation. While lying in the bed he does not have any pain in the right foot. He does have a history of neuropathy. He's never had similar wounds in the past. He also gets care at the [facility] wound center. He call them on Monday and he was told to start taking Bactrim. He has taken 3 doses of the same. When he called in today they asked him to go to the nearest emergency department. Subsequently he was transferred here to the hospital for further management by orthopedics.  Home Medications: Prior to Admission medications   Medication Sig Start Date End Date Taking? Authorizing Provider  aspirin 81 MG tablet Take 81 mg by mouth daily.     Yes Historical Provider, MD  atorvastatin (LIPITOR) 10 MG tablet Take 10 mg by mouth daily at 6 PM.    Yes Historical Provider, MD  azithromycin (ZITHROMAX) 250 MG tablet Take 250 mg by mouth daily. 06/02/14  Yes Historical Provider, MD  Calcium Carbonate (CALCIUM 500 PO) Take 3  tablets by mouth daily.   Yes Historical Provider, MD  Calcium Citrate 200 MG TABS Take 500 mg by mouth 3 (three) times daily. 500 mg tid   Yes Historical Provider, MD  Cyanocobalamin (B-12) 500 MCG SUBL Place 1,000 mcg under the tongue daily.    Yes Historical Provider, MD  fluticasone (FLONASE) 50 MCG/ACT nasal spray Place 2 sprays into both nostrils 2 (two) times daily.   Yes Historical Provider, MD  Iron Combinations (IRON COMPLEX PO) Take 65 mg by mouth daily.    Yes Historical Provider, MD  levothyroxine (SYNTHROID, LEVOTHROID) 200 MCG tablet Take 200 mcg by mouth daily.  03/07/10  Yes Historical Provider, MD  Multiple Vitamins-Minerals (MULTIVITAMIN PO) Take 2 tablets by mouth daily. Chewable tablet   Yes Historical Provider, MD  Multiple Vitamins-Minerals (OSTEO COMPLEX PO) SHAKLEE OSTEO MATRIX 03/07/10  Yes Historical Provider, MD  nitroGLYCERIN (NITROSTAT) 0.4 MG SL tablet Place 0.4 mg under the tongue every 5 (five) minutes x 3 doses as needed for chest pain.  11/08/12  Yes Historical Provider, MD  OMEGA 3 1000 MG CAPS Take 1 capsule by mouth daily.    Yes Historical Provider, MD  Omeprazole 20 MG TBEC Take 20 mg by mouth every other day. 03/07/10  Yes Historical Provider, MD  Polyethylene Glycol 3350 (MIRALAX PO) Take 1 packet by mouth daily as needed (constipation).    Yes Historical Provider, MD  Probiotic Product (PROBIOTIC FORMULA PO) Take 1  tablet by mouth daily.    Yes Historical Provider, MD  SILDENAFIL CITRATE PO Take 1 tablet by mouth daily as needed (erectile dysfuntion).    Yes Historical Provider, MD  sodium chloride (OCEAN) 0.65 % SOLN nasal spray Place 1 spray into both nostrils as needed for congestion.   Yes Historical Provider, MD  sulfamethoxazole-trimethoprim (BACTRIM,SEPTRA) 400-80 MG per tablet Take 1 tablet by mouth daily.  06/01/14  Yes Historical Provider, MD  traMADol (ULTRAM) 50 MG tablet Take 50 mg by mouth every 6 (six) hours as needed. For pain   Yes Historical  Provider, MD  Wheat Dextrin (BENEFIBER PO) Take 1 each by mouth daily as needed (for constipation).    Yes Historical Provider, MD  zaleplon (SONATA) 10 MG capsule Take 10 mg by mouth at bedtime. 03/07/10  Yes Historical Provider, MD  cyclobenzaprine (FLEXERIL) 10 MG tablet Take 10 mg by mouth 3 (three) times daily as needed for muscle spasms.  05/29/14   Historical Provider, MD  zaleplon (SONATA) 10 MG capsule Take 1 capsule (10 mg total) by mouth at bedtime as needed. 09/04/11 11/12/12  [name], MD    Allergies:  Allergies  Allergen Reactions  . Quinolones Itching  . Bactrim [Sulfamethoxazole-Trimethoprim] Itching and Rash  . Moxifloxacin Rash  . Penicillin G Rash  . Penicillins Rash    Past Medical History: Past Medical History  Diagnosis Date  . Hyperlipidemia   . Heart attack 06/2004  . Coronary artery disease   . Hypothyroidism   . GERD (gastroesophageal reflux disease)   . Diabetes mellitus     diet-controlled  . Sleep apnea     sleep study 03/29/2011; no CPAP use  . Mucoid cyst of joint 09/2011    left thumb  . Hx MRSA infection 02/2006  . Dental crowns present   . Hypertension     hx. of - has not been on med. since losing wt. after gastric bypass  . Bifascicular block   . First degree AV block   . Heart murmur     aortic    Past Surgical History  Procedure Laterality Date  . Roux-en-y gastric bypass  10/10/2010    laparoscopic  . Toe amputation  02/23/2006    left foot second ray amputation  . Coronary angioplasty with stent placement  07/12/2004; 08/03/2004    total of 3 stents  . Cataract extraction  02/2010; 03/2010  . Mass excision  10/04/2011    Procedure: EXCISION MASS;  Surgeon: Wynonia Sours, MD;  Location: [place];  Service: Orthopedics;  Laterality: Left;  excision cyst debridment ip joint of left thumb    Social History: Patient is in [place] with his wife. No history of smoking, alcohol use or any recreational drug use. He works  as a Engineer, maintenance (IT). Usually independent with daily activities.  Family History:  Family History  Problem Relation Age of Onset  . Heart disease Mother      Review of Systems - History obtained from the patient General ROS: positive for  - chills and fatigue Psychological ROS: negative Ophthalmic ROS: negative ENT ROS: negative Allergy and Immunology ROS: negative Hematological and Lymphatic ROS: negative Endocrine ROS: negative Respiratory ROS: no cough, shortness of breath, or wheezing Cardiovascular ROS: no chest pain or dyspnea on exertion Gastrointestinal ROS: no abdominal pain, change in bowel habits, or black or bloody stools Genito-Urinary ROS: no dysuria, trouble voiding, or hematuria Musculoskeletal ROS: as in hpi Neurological ROS: no TIA or  stroke symptoms Dermatological ROS: negative  Physical Examination  Filed Vitals:   06/03/14 1126 06/03/14 1422 06/03/14 1609 06/03/14 1731  BP: 152/71 114/58 111/61 113/57  Pulse: 68 60 58 67  Temp: 98.2 F (36.8 C) 98.7 F (37.1 C)  99.5 F (37.5 C)  TempSrc: Oral Oral  Oral  Resp: '16 16 16 18  ' Height: '6\' 8"'  (2.032 m)   '6\' 8"'  (2.032 m)  Weight: 109.77 kg (242 lb)   112.492 kg (248 lb)  SpO2: 100% 100% 100% 98%    BP 113/57 mmHg  Pulse 67  Temp(Src) 99.5 F (37.5 C) (Oral)  Resp 18  Ht '6\' 8"'  (2.032 m)  Wt 112.492 kg (248 lb)  BMI 27.24 kg/m2  SpO2 98%  General appearance: alert, cooperative, appears stated age and no distress Head: Normocephalic, without obvious abnormality, atraumatic Throat: lips, mucosa, and tongue normal; teeth and gums normal Neck: no adenopathy, no carotid bruit, no JVD, supple, symmetrical, trachea midline and thyroid not enlarged, symmetric, no tenderness/mass/nodules Resp: clear to auscultation bilaterally Cardio: S1, S2 is normal, regular. No S3, S4. Systolic murmur appreciated over the precordium. No rubs, or bruit. No pedal edema. GI: soft, non-tender; bowel sounds normal; no masses,  no  organomegaly Extremities: Right lower extremity and foot was covered with a dressing that was recently applied by orthopedics. So this was not removed for examination. However, warmth was appreciated in the lower leg and there was some erythema noted as well. Pulses: Good pulses in the left lower extremity. Skin: Erythema. Right lower extremity Lymph nodes: Cervical, supraclavicular, and axillary nodes normal. Neurologic: No focal deficits.  Laboratory Data: Results for orders placed or performed during the hospital encounter of 06/03/14 (from the past 48 hour(s))  Comprehensive metabolic panel     Status: Abnormal   Collection Time: 06/03/14  1:24 PM  Result Value Ref Range   Sodium 139 135 - 145 mmol/L   Potassium 4.5 3.5 - 5.1 mmol/L   Chloride 102 101 - 111 mmol/L   CO2 28 22 - 32 mmol/L   Glucose, Bld 176 (H) 70 - 99 mg/dL   BUN 24 (H) 6 - 20 mg/dL   Creatinine, Ser 0.92 0.61 - 1.24 mg/dL   Calcium 9.1 8.9 - 10.3 mg/dL   Total Protein 7.3 6.5 - 8.1 g/dL   Albumin 3.6 3.5 - 5.0 g/dL   AST 15 15 - 41 U/L   ALT 12 (L) 17 - 63 U/L   Alkaline Phosphatase 64 38 - 126 U/L   Total Bilirubin 1.3 (H) 0.3 - 1.2 mg/dL   GFR calc non Af Amer >60 >60 mL/min   GFR calc Af Amer >60 >60 mL/min    Comment: (NOTE) The eGFR has been calculated using the CKD EPI equation. This calculation has not been validated in all clinical situations. eGFR's persistently <60 mL/min signify possible Chronic Kidney Disease.    Anion gap 9 5 - 15  CBC with Differential     Status: Abnormal   Collection Time: 06/03/14  1:24 PM  Result Value Ref Range   WBC 12.6 (H) 4.0 - 10.5 K/uL   RBC 4.55 4.22 - 5.81 MIL/uL   Hemoglobin 14.5 13.0 - 17.0 g/dL   HCT 42.1 39.0 - 52.0 %   MCV 92.5 78.0 - 100.0 fL   MCH 31.9 26.0 - 34.0 pg   MCHC 34.4 30.0 - 36.0 g/dL   RDW 13.1 11.5 - 15.5 %   Platelets 183 150 - 400 K/uL  Neutrophils Relative % 87 (H) 43 - 77 %   Neutro Abs 10.9 (H) 1.7 - 7.7 K/uL   Lymphocytes  Relative 5 (L) 12 - 46 %   Lymphs Abs 0.7 0.7 - 4.0 K/uL   Monocytes Relative 8 3 - 12 %   Monocytes Absolute 1.0 0.1 - 1.0 K/uL   Eosinophils Relative 0 0 - 5 %   Eosinophils Absolute 0.0 0.0 - 0.7 K/uL   Basophils Relative 0 0 - 1 %   Basophils Absolute 0.0 0.0 - 0.1 K/uL  Protime-INR     Status: Abnormal   Collection Time: 06/03/14  1:24 PM  Result Value Ref Range   Prothrombin Time 15.3 (H) 11.6 - 15.2 seconds   INR 1.21 0.00 - 1.49  I-Stat CG4 Lactic Acid, ED     Status: None   Collection Time: 06/03/14  1:33 PM  Result Value Ref Range   Lactic Acid, Venous 1.58 0.5 - 2.0 mmol/L    Radiology Reports: Dg Foot Complete Right  06/03/2014   CLINICAL DATA:  Plantar foot wound  EXAM: RIGHT FOOT COMPLETE - 3+ VIEW  COMPARISON:  02/11/2013  FINDINGS: Generalized soft tissue swelling is noted about the tarsal bones. Significant tarsal degenerative changes are noted but stable from the prior exam. These likely represent Charcot joints. Soft tissue prominence is noted in the plantar aspect of the foot consistent with the current history. Vascular calcifications are noted.  IMPRESSION: Soft tissue wound on the plantar aspect of the foot.  Chronic changes in the tarsal bones stable from the previous exam.   Electronically Signed   By: Inez Catalina M.D.   On: 06/03/2014 13:54     Problem List  Principal Problem:   Diabetic foot infection Active Problems:   Hypothyroidism   Peripheral vascular disease   Abscess of foot   Assessment: This is a 65 year old Caucasian male with a past medical history as stated earlier, presents with pain and swelling in the right foot. He has a wound in the plantar aspect. His examination was suspicious for a foot abscess. He was transferred to [facility] for further management. He states that ever since he underwent his gastric bypass surgery and lost all that weight he hasn't had a need to take any of his medications for diabetes or high blood pressure. His  blood glucose level is noted to be slightly high on the blood work done earlier today.  Plan: #1 Right foot wound with possible abscess: Patient has been seen by orthopedics. They plan to take him to the OR tomorrow afternoon. We will place him on vancomycin and Aztreonam. Pain management. Wound care will be deferred to orthopedics.  #2 Past history of diabetes, currently not on meds, now with hyperglycemia: This is not fasting level. We will repeat another level tomorrow morning. We will check HbA1c. For now, we will place him on sliding scale coverage.  #3 history of hypothyroidism: Continue with his home dose of levothyroxine.  DVT Prophylaxis: His CDs Code Status: Full code Family Communication: Discussed with the patient and his wife  Disposition Plan: Admit to MedSurg   Further management decisions will depend on results of further testing and patient's response to treatment.   [facility]  [facility] Pager [phone]  If 7PM-7AM, please contact night-coverage [email or web address] Password [facility]  06/03/2014, 7:18 PM

## 2014-06-03 NOTE — ED Notes (Signed)
Report given to [facility] on [facility] [address] at [facility].

## 2014-06-03 NOTE — ED Notes (Signed)
Attempt to call report to 6N. Unable to take report at this time. Will call back.

## 2014-06-03 NOTE — ED Notes (Signed)
Report given to [facility] EMT-P with [facility].

## 2014-06-03 NOTE — ED Notes (Signed)
Pt discharged  To [facility] care to transport to 6[facility].

## 2014-06-03 NOTE — ED Provider Notes (Addendum)
CSN: [id number]     Arrival date & time 06/03/14  1041 History   First MD Initiated Contact with Patient 06/03/14 1248     Chief Complaint  Patient presents with  . Wound Infection     (Consider location/radiation/quality/duration/timing/severity/associated sxs/prior Treatment) HPI The patient was seen at [facility] wound care 2 days ago for a wound on his great toe. He reports they cleaned it and dressed it and he was not having any problems. He was started on Bactrim. Yesterday however he started noticed pain in the sole of his foot and redness developing. Today he has redness going up the side of the foot and coming up his leg. He reports he has Charcot foot and has a callus on the bottom of his foot. Now however it has become very painful and there is intense redness around it. He reports as of yesterday he was feeling some chills but not having a documented fever.he denies other areas of pain. He denies nausea or vomiting. He denies general malaise. Past Medical History  Diagnosis Date  . Hyperlipidemia   . Heart attack 06/2004  . Coronary artery disease   . Hypothyroidism   . GERD (gastroesophageal reflux disease)   . Diabetes mellitus     diet-controlled  . Sleep apnea     sleep study 03/29/2011; no CPAP use  . Mucoid cyst of joint 09/2011    left thumb  . Hx MRSA infection 02/2006  . Dental crowns present   . Hypertension     hx. of - has not been on med. since losing wt. after gastric bypass  . Bifascicular block   . First degree AV block   . Heart murmur     aortic   Past Surgical History  Procedure Laterality Date  . Roux-en-y gastric bypass  10/10/2010    laparoscopic  . Toe amputation  02/23/2006    left foot second ray amputation  . Coronary angioplasty with stent placement  07/12/2004; 08/03/2004    total of 3 stents  . Cataract extraction  02/2010; 03/2010  . Mass excision  10/04/2011    Procedure: EXCISION MASS;  Surgeon: Wynonia Sours, MD;  Location: [place];  Service: Orthopedics;  Laterality: Left;  excision cyst debridment ip joint of left thumb   No family history on file. History  Substance Use Topics  . Smoking status: Never Smoker   . Smokeless tobacco: Never Used  . Alcohol Use: No    Review of Systems 10 Systems reviewed and are negative for acute change except as noted in the HPI.    Allergies  Moxifloxacin and Penicillins  Home Medications   Prior to Admission medications   Medication Sig Start Date End Date Taking? Authorizing Provider  Azithromycin (ZITHROMAX PO) Take by mouth.   Yes Historical Provider, MD  SILDENAFIL CITRATE PO Take by mouth.   Yes Historical Provider, MD  Sulfamethoxazole-Trimethoprim (BACTRIM PO) Take by mouth.   Yes Historical Provider, MD  aspirin 81 MG tablet Take 81 mg by mouth daily.      Historical Provider, MD  atorvastatin (LIPITOR) 10 MG tablet Take 10 mg by mouth daily.    Historical Provider, MD  Calcium Carbonate (CALCIUM 500 PO) Take 3 tablets by mouth daily.    Historical Provider, MD  Cyanocobalamin (B-12) 500 MCG SUBL Place under the tongue daily.    Historical Provider, MD  Iron Combinations (IRON COMPLEX PO) Take 65 mg by mouth.    Historical  Provider, MD  levothyroxine (SYNTHROID, LEVOTHROID) 175 MCG tablet Take 175 mcg by mouth daily before breakfast.    Historical Provider, MD  Multiple Vitamins-Minerals (MULTIVITAMIN PO) Take 2 tablets by mouth daily. Chewable tablet    Historical Provider, MD  OMEGA 3 1000 MG CAPS Take 1 capsule by mouth daily.     Historical Provider, MD  Omeprazole (PRILOSEC PO) Take 1 tablet by mouth every other day.     Historical Provider, MD  Polyethylene Glycol 3350 (MIRALAX PO) Take 1 packet by mouth daily.    Historical Provider, MD  Probiotic Product (PROBIOTIC FORMULA PO) Take by mouth.    Historical Provider, MD  Wheat Dextrin (BENEFIBER PO) Take 1 each by mouth daily.    Historical Provider, MD  zaleplon (SONATA) 10 MG capsule Take 1 capsule  (10 mg total) by mouth at bedtime as needed. 09/04/11 11/12/12  [name], MD   BP 114/58 mmHg  Pulse 60  Temp(Src) 98.7 F (37.1 C) (Oral)  Resp 16  Ht 6\' 8"  (2.032 m)  Wt 242 lb (109.77 kg)  BMI 26.58 kg/m2  SpO2 100% Physical Exam  Constitutional: He is oriented to person, place, and time. He appears well-developed and well-nourished.  HENT:  Head: Normocephalic and atraumatic.  Eyes: EOM are normal. Pupils are equal, round, and reactive to light.  Neck: Neck supple.  Cardiovascular: Normal rate, regular rhythm and intact distal pulses.   3/6 systolic ejection murmur.  Pulmonary/Chest: Effort normal and breath sounds normal.  Abdominal: Soft. Bowel sounds are normal. He exhibits no distension. There is no tenderness.  Musculoskeletal: He exhibits edema and tenderness.  Neurological: He is alert and oriented to person, place, and time. He has normal strength. Coordination normal. GCS eye subscore is 4. GCS verbal subscore is 5. GCS motor subscore is 6.  Skin: Skin is warm, dry and intact.  Psychiatric: He has a normal mood and affect.             ED Course  Procedures (including critical care time) Labs Review Labs Reviewed  COMPREHENSIVE METABOLIC PANEL - Abnormal; Notable for the following:    Glucose, Bld 176 (*)    BUN 24 (*)    ALT 12 (*)    Total Bilirubin 1.3 (*)    All other components within normal limits  CBC WITH DIFFERENTIAL/PLATELET - Abnormal; Notable for the following:    WBC 12.6 (*)    Neutrophils Relative % 87 (*)    Neutro Abs 10.9 (*)    Lymphocytes Relative 5 (*)    All other components within normal limits  PROTIME-INR - Abnormal; Notable for the following:    Prothrombin Time 15.3 (*)    All other components within normal limits  CULTURE, BLOOD (ROUTINE X 2)  CULTURE, BLOOD (ROUTINE X 2)  I-STAT CG4 LACTIC ACID, ED    Imaging Review Dg Foot Complete Right  06/03/2014   CLINICAL DATA:  Plantar foot wound  EXAM: RIGHT FOOT  COMPLETE - 3+ VIEW  COMPARISON:  02/11/2013  FINDINGS: Generalized soft tissue swelling is noted about the tarsal bones. Significant tarsal degenerative changes are noted but stable from the prior exam. These likely represent Charcot joints. Soft tissue prominence is noted in the plantar aspect of the foot consistent with the current history. Vascular calcifications are noted.  IMPRESSION: Soft tissue wound on the plantar aspect of the foot.  Chronic changes in the tarsal bones stable from the previous exam.   Electronically Signed  By: Inez Catalina M.D.   On: 06/03/2014 13:54     EKG Interpretation None     Consult: His case reviewed Dr. Mardelle Matte. The patient will be admitted to the hospitalist service. Dr. Mardelle Matte advises the patient will be evaluated by orthopedics either himself or Dr. Sharol Given for suspected deep space foot infection and management.  CRITICAL CARE Performed by: Charlesetta Shanks   Total critical care time: 30  Critical care time was exclusive of separately billable procedures and treating other patients.  Critical care was necessary to treat or prevent imminent or life-threatening deterioration.  Critical care was time spent personally by me on the following activities: development of treatment plan with patient and/or surrogate as well as nursing, discussions with consultants, evaluation of patient's response to treatment, examination of patient, obtaining history from patient or surrogate, ordering and performing treatments and interventions, ordering and review of laboratory studies, ordering and review of radiographic studies, pulse oximetry and re-evaluation of patient's condition. MDM   Final diagnoses:  Abscess of foot  Cellulitis of right lower extremity  Type 2 diabetes mellitus with foot ulcer  Peripheral vascular disease   Patient presents with a large area of cellulitis and plantar abscess. The patient is diabetic and has history of Charcot foot. There is concern  for deep abscess or possible osteomyelitis. Per discussion with Dr. Mardelle Matte the patient will be started on antibiotics while awaiting transfer to [facility] and orthopedic consultation. The patient is not septic upon arrival. He has not had documented fever. He denies general symptoms of malaise. He does state yesterday he was noting some chills without fever.  Note patient has penicillin allergy. Plan was for meropenem which is not available at this facility. Vancomycin has been initiated. Change has been made for Flagyl and Azactam in place meropenem.  Charlesetta Shanks, MD 06/03/14 1501  Charlesetta Shanks, MD 06/03/14 [phone]

## 2014-06-03 NOTE — ED Notes (Signed)
Pt being treated for right great toe infection at [facility] wound care-rx bactrim-pt now c/o swelling/redness to ankle

## 2014-06-03 NOTE — Progress Notes (Signed)
Spoke with ED MD and his primary orthopedist.  Foot abscess, appears to need surgery.  Rec transfer to [facility] to hospitalist service, Ortho consult tonight Zollie Beckers), probable surgery tomorrow.    Bee for IV abx and sepsis management.   Johnny Bridge, MD

## 2014-06-04 ENCOUNTER — Inpatient Hospital Stay (HOSPITAL_COMMUNITY): Payer: BLUE CROSS/BLUE SHIELD | Admitting: Anesthesiology

## 2014-06-04 ENCOUNTER — Encounter (HOSPITAL_COMMUNITY)
Admission: EM | Disposition: A | Discharge status: Home / Self Care | Payer: Self-pay | Source: Home / Self Care | Attending: Internal Medicine

## 2014-06-04 ENCOUNTER — Encounter (HOSPITAL_COMMUNITY): Payer: Self-pay | Admitting: Certified Registered Nurse Anesthetist

## 2014-06-04 DIAGNOSIS — L089 Local infection of the skin and subcutaneous tissue, unspecified: Secondary | ICD-10-CM

## 2014-06-04 DIAGNOSIS — E1169 Type 2 diabetes mellitus with other specified complication: Secondary | ICD-10-CM

## 2014-06-04 HISTORY — PX: I & D EXTREMITY: SHX5045

## 2014-06-04 LAB — COMPREHENSIVE METABOLIC PANEL
ALT: 12 U/L — ABNORMAL LOW (ref 17–63)
AST: 15 U/L (ref 15–41)
Albumin: 3 g/dL — ABNORMAL LOW (ref 3.5–5.0)
Alkaline Phosphatase: 58 U/L (ref 38–126)
Anion gap: 10 (ref 5–15)
BUN: 14 mg/dL (ref 6–20)
CO2: 23 mmol/L (ref 22–32)
Calcium: 8.6 mg/dL — ABNORMAL LOW (ref 8.9–10.3)
Chloride: 104 mmol/L (ref 101–111)
Creatinine, Ser: 0.94 mg/dL (ref 0.61–1.24)
GFR calc Af Amer: >60 mL/min (ref >60)
GFR calc non Af Amer: >60 mL/min (ref >60)
Glucose, Bld: 188 mg/dL — ABNORMAL HIGH (ref 65–99)
Potassium: 3.6 mmol/L (ref 3.5–5.1)
Sodium: 137 mmol/L (ref 135–145)
Total Bilirubin: 1.3 mg/dL — ABNORMAL HIGH (ref 0.3–1.2)
Total Protein: 7 g/dL (ref 6.5–8.1)

## 2014-06-04 LAB — GLUCOSE, CAPILLARY
Glucose-Capillary: 158 mg/dL — ABNORMAL HIGH (ref 65–99)
Glucose-Capillary: 146 mg/dL — ABNORMAL HIGH (ref 65–99)
Glucose-Capillary: 163 mg/dL — ABNORMAL HIGH (ref 65–99)
Glucose-Capillary: 140 mg/dL — ABNORMAL HIGH (ref 65–99)
Glucose-Capillary: 159 mg/dL — ABNORMAL HIGH (ref 65–99)

## 2014-06-04 LAB — CBC
HCT: 39.8 % (ref 39.0–52.0)
Hemoglobin: 13.7 g/dL (ref 13.0–17.0)
MCH: 31.3 pg (ref 26.0–34.0)
MCHC: 34.4 g/dL (ref 30.0–36.0)
MCV: 90.9 fL (ref 78.0–100.0)
Platelets: 190 10*3/uL (ref 150–400)
RBC: 4.38 MIL/uL (ref 4.22–5.81)
RDW: 13.1 % (ref 11.5–15.5)
WBC: 11 10*3/uL — ABNORMAL HIGH (ref 4.0–10.5)

## 2014-06-04 LAB — SURGICAL PCR SCREEN
MRSA, PCR: NEGATIVE
Staphylococcus aureus: POSITIVE — AB

## 2014-06-04 SURGERY — IRRIGATION AND DEBRIDEMENT EXTREMITY
Anesthesia: Monitor Anesthesia Care | Site: Foot | Laterality: Right

## 2014-06-04 MED ORDER — HYDROMORPHONE HCL 1 MG/ML IJ SOLN: 1.0000 mg | INTRAMUSCULAR | Status: DC | PRN

## 2014-06-04 MED ORDER — PROPOFOL INFUSION 10 MG/ML OPTIME
INTRAVENOUS | Status: DC | PRN
  Administered 2014-06-04: 50 ug/kg/min via INTRAVENOUS

## 2014-06-04 MED ORDER — LACTATED RINGERS IV SOLN
INTRAVENOUS | Status: DC
  Administered 2014-06-04 (×2): via INTRAVENOUS

## 2014-06-04 MED ORDER — PROMETHAZINE HCL 25 MG/ML IJ SOLN: 6.2500 mg | INTRAMUSCULAR | Status: DC | PRN

## 2014-06-04 MED ORDER — GLUCERNA SHAKE PO LIQD
237.0000 mL | Freq: Two times a day (BID) | ORAL | Status: DC
  Administered 2014-06-05 – 2014-06-08 (×3): 237 mL via ORAL

## 2014-06-04 MED ORDER — MEPIVACAINE HCL 1.5 % IJ SOLN
INTRAMUSCULAR | Status: DC | PRN
  Administered 2014-06-04: 20 mL via PERINEURAL

## 2014-06-04 MED ORDER — MIDAZOLAM HCL 5 MG/5ML IJ SOLN
INTRAMUSCULAR | Status: DC | PRN
  Administered 2014-06-04: 2 mg via INTRAVENOUS

## 2014-06-04 MED ORDER — FENTANYL CITRATE (PF) 250 MCG/5ML IJ SOLN
INTRAMUSCULAR | Status: AC
  Filled 2014-06-04: qty 5

## 2014-06-04 MED ORDER — MUPIROCIN 2 % EX OINT
TOPICAL_OINTMENT | CUTANEOUS | Status: AC
  Administered 2014-06-04: 1 via TOPICAL
  Filled 2014-06-04: qty 22

## 2014-06-04 MED ORDER — OXYCODONE HCL 5 MG PO TABS: 5.0000 mg | ORAL_TABLET | ORAL | Status: DC | PRN

## 2014-06-04 MED ORDER — SODIUM CHLORIDE 0.9 % IR SOLN
Status: DC | PRN
  Administered 2014-06-04: 3000 mL

## 2014-06-04 MED ORDER — MIDAZOLAM HCL 2 MG/2ML IJ SOLN
2.0000 mg | Freq: Once | INTRAMUSCULAR | Status: AC
  Administered 2014-06-04: 2 mg via INTRAVENOUS
  Filled 2014-06-04: qty 2

## 2014-06-04 MED ORDER — BUPIVACAINE-EPINEPHRINE (PF) 0.5% -1:200000 IJ SOLN
INTRAMUSCULAR | Status: DC | PRN
  Administered 2014-06-04: 30 mL

## 2014-06-04 MED ORDER — MIDAZOLAM HCL 2 MG/2ML IJ SOLN
INTRAMUSCULAR | Status: AC
  Filled 2014-06-04: qty 2

## 2014-06-04 MED ORDER — MUPIROCIN 2 % EX OINT
1.0000 "application " | TOPICAL_OINTMENT | Freq: Once | CUTANEOUS | Status: AC
  Administered 2014-06-04: 1 via TOPICAL

## 2014-06-04 MED ORDER — FENTANYL CITRATE (PF) 100 MCG/2ML IJ SOLN: 25.0000 ug | INTRAMUSCULAR | Status: DC | PRN

## 2014-06-04 MED ORDER — FENTANYL CITRATE (PF) 100 MCG/2ML IJ SOLN
100.0000 ug | Freq: Once | INTRAMUSCULAR | Status: AC
  Administered 2014-06-04: 100 ug via INTRAVENOUS
  Filled 2014-06-04: qty 2

## 2014-06-04 MED ORDER — FENTANYL CITRATE (PF) 100 MCG/2ML IJ SOLN
INTRAMUSCULAR | Status: DC | PRN
  Administered 2014-06-04: 50 ug via INTRAVENOUS

## 2014-06-04 MED ORDER — ZOLPIDEM TARTRATE 5 MG PO TABS
5.0000 mg | ORAL_TABLET | Freq: Every evening | ORAL | Status: DC | PRN
  Administered 2014-06-04: 5 mg via ORAL
  Filled 2014-06-04: qty 1

## 2014-06-04 SURGICAL SUPPLY — 58 items
BANDAGE ELASTIC 3 VELCRO ST LF (GAUZE/BANDAGES/DRESSINGS) IMPLANT
BLADE SURG 10 STRL SS (BLADE) ×2 IMPLANT
BNDG COHESIVE 1X5 TAN STRL LF (GAUZE/BANDAGES/DRESSINGS) IMPLANT
BNDG COHESIVE 4X5 TAN STRL (GAUZE/BANDAGES/DRESSINGS) ×2 IMPLANT
BNDG COHESIVE 6X5 TAN STRL LF (GAUZE/BANDAGES/DRESSINGS) ×4 IMPLANT
BNDG CONFORM 3 STRL LF (GAUZE/BANDAGES/DRESSINGS) IMPLANT
BNDG GAUZE ELAST 4 BULKY (GAUZE/BANDAGES/DRESSINGS) ×2 IMPLANT
BNDG GAUZE STRTCH 6 (GAUZE/BANDAGES/DRESSINGS) ×6 IMPLANT
CORDS BIPOLAR (ELECTRODE) IMPLANT
COVER SURGICAL LIGHT HANDLE (MISCELLANEOUS) ×2 IMPLANT
CUFF TOURNIQUET SINGLE 18IN (TOURNIQUET CUFF) ×2 IMPLANT
CUFF TOURNIQUET SINGLE 24IN (TOURNIQUET CUFF) IMPLANT
CUFF TOURNIQUET SINGLE 34IN LL (TOURNIQUET CUFF) IMPLANT
CUFF TOURNIQUET SINGLE 44IN (TOURNIQUET CUFF) IMPLANT
DRAPE ORTHO SPLIT 77X108 STRL (DRAPES) ×2
DRAPE SURG 17X23 STRL (DRAPES) IMPLANT
DRAPE SURG ORHT 6 SPLT 77X108 (DRAPES) ×2 IMPLANT
DRAPE U-SHAPE 47X51 STRL (DRAPES) ×2 IMPLANT
DURAPREP 26ML APPLICATOR (WOUND CARE) ×2 IMPLANT
ELECT CAUTERY BLADE 6.4 (BLADE) IMPLANT
ELECT REM PT RETURN 9FT ADLT (ELECTROSURGICAL)
ELECTRODE REM PT RTRN 9FT ADLT (ELECTROSURGICAL) IMPLANT
GAUZE SPONGE 4X4 12PLY STRL (GAUZE/BANDAGES/DRESSINGS) IMPLANT
GAUZE XEROFORM 1X8 LF (GAUZE/BANDAGES/DRESSINGS) ×2 IMPLANT
GAUZE XEROFORM 5X9 LF (GAUZE/BANDAGES/DRESSINGS) ×2 IMPLANT
GLOVE BIO SURGEON STRL SZ8 (GLOVE) ×2 IMPLANT
GLOVE BIOGEL PI IND STRL 8 (GLOVE) ×2 IMPLANT
GLOVE BIOGEL PI INDICATOR 8 (GLOVE) ×2
GLOVE ORTHO TXT STRL SZ7.5 (GLOVE) ×2 IMPLANT
GOWN STRL REUS W/ TWL LRG LVL3 (GOWN DISPOSABLE) ×1 IMPLANT
GOWN STRL REUS W/ TWL XL LVL3 (GOWN DISPOSABLE) ×4 IMPLANT
GOWN STRL REUS W/TWL LRG LVL3 (GOWN DISPOSABLE) ×1
GOWN STRL REUS W/TWL XL LVL3 (GOWN DISPOSABLE) ×4
HANDPIECE INTERPULSE COAX TIP (DISPOSABLE) ×1
KIT BASIN OR (CUSTOM PROCEDURE TRAY) ×2 IMPLANT
KIT ROOM TURNOVER OR (KITS) ×2 IMPLANT
MANIFOLD NEPTUNE II (INSTRUMENTS) ×2 IMPLANT
NS IRRIG 1000ML POUR BTL (IV SOLUTION) ×2 IMPLANT
PACK ORTHO EXTREMITY (CUSTOM PROCEDURE TRAY) ×2 IMPLANT
PAD ABD 8X10 STRL (GAUZE/BANDAGES/DRESSINGS) ×2 IMPLANT
PAD ARMBOARD 7.5X6 YLW CONV (MISCELLANEOUS) ×4 IMPLANT
PADDING CAST ABS 4INX4YD NS (CAST SUPPLIES) ×2
PADDING CAST ABS COTTON 4X4 ST (CAST SUPPLIES) ×2 IMPLANT
PADDING CAST COTTON 6X4 STRL (CAST SUPPLIES) ×2 IMPLANT
SET HNDPC FAN SPRY TIP SCT (DISPOSABLE) ×1 IMPLANT
SPONGE LAP 18X18 X RAY DECT (DISPOSABLE) ×2 IMPLANT
STOCKINETTE IMPERVIOUS 9X36 MD (GAUZE/BANDAGES/DRESSINGS) ×2 IMPLANT
SUT ETHILON 2 0 FS 18 (SUTURE) ×6 IMPLANT
SUT ETHILON 3 0 PS 1 (SUTURE) ×4 IMPLANT
SYR CONTROL 10ML LL (SYRINGE) IMPLANT
TOWEL OR 17X24 6PK STRL BLUE (TOWEL DISPOSABLE) ×2 IMPLANT
TOWEL OR 17X26 10 PK STRL BLUE (TOWEL DISPOSABLE) ×2 IMPLANT
TUBE ANAEROBIC SPECIMEN COL (MISCELLANEOUS) IMPLANT
TUBE CONNECTING 12X1/4 (SUCTIONS) ×2 IMPLANT
TUBE FEEDING 5FR 15 INCH (TUBING) IMPLANT
UNDERPAD 30X30 INCONTINENT (UNDERPADS AND DIAPERS) ×2 IMPLANT
WATER STERILE IRR 1000ML POUR (IV SOLUTION) ×2 IMPLANT
YANKAUER SUCT BULB TIP NO VENT (SUCTIONS) ×2 IMPLANT

## 2014-06-04 NOTE — Progress Notes (Signed)
Pt to OR.

## 2014-06-04 NOTE — Progress Notes (Signed)
[facility] PROGRESS NOTE  Walker Sitar JAS:[id number] DOB: 09-Aug-1950 DOA: 06/03/2014 PCP: Irven Shelling, MD  Assessment/Plan: 1- Right foot wound with possible abscess OR today for I and d.  Continue with vancomycin and Aztreonam.  Blood culture no growth to date.   2-Past history of diabetes, currently not on meds, now with hyperglycemia:  Continue with SSI. Follow Hb-A1c to determine outpatient therapy.   3-Hyponatremia; start Glucerna;/   Code Status: Full Code.  Family Communication: care discussed with wife who was at bedside.  Disposition Plan: Remain inpatient, OR today.    Consultants:  Dr [name].   Procedures:  none  Antibiotics:  IV vancomycin 5-11  Aztreonam 5-11  HPI/Subjective: Pain right foot  is better. Denies dyspnea. No new complaints.  He used to be a diabetic, but after he lost weight    Objective: Filed Vitals:   06/04/14 1501  BP: 122/69  Pulse: 59  Temp: 98.9 F (37.2 C)  Resp: 17   No intake or output data in the 24 hours ending 06/04/14 1549 Filed Weights   06/03/14 1126 06/03/14 1731  Weight: 109.77 kg (242 lb) 112.492 kg (248 lb)    Exam:   General:  Alert in no distress.   Cardiovascular: S 1, S 2 RRR  Respiratory: CTA  Abdomen: BS present, soft, nt  Musculoskeletal: right foot with dressing.   Data Reviewed: Basic Metabolic Panel:  Recent Labs Lab 06/03/14 1324 06/04/14 0340  NA 139 137  K 4.5 3.6  CL 102 104  CO2 28 23  GLUCOSE 176* 188*  BUN 24* 14  CREATININE 0.92 0.94  CALCIUM 9.1 8.6*   Liver Function Tests:  Recent Labs Lab 06/03/14 1324 06/04/14 0340  AST 15 15  ALT 12* 12*  ALKPHOS 64 58  BILITOT 1.3* 1.3*  PROT 7.3 7.0  ALBUMIN 3.6 3.0*   No results for input(s): LIPASE, AMYLASE in the last 168 hours. No results for input(s): AMMONIA in the last 168 hours. CBC:  Recent Labs Lab 06/03/14 1324 06/04/14 0340  WBC 12.6* 11.0*  NEUTROABS 10.9*  --   HGB 14.5 13.7  HCT  42.1 39.8  MCV 92.5 90.9  PLT 183 190   Cardiac Enzymes: No results for input(s): CKTOTAL, CKMB, CKMBINDEX, TROPONINI in the last 168 hours. BNP (last 3 results) No results for input(s): BNP in the last 8760 hours.  ProBNP (last 3 results) No results for input(s): PROBNP in the last 8760 hours.  CBG:  Recent Labs Lab 06/03/14 2113 06/04/14 0800 06/04/14 1154  GLUCAP 175* 158* 146*    Recent Results (from the past 240 hour(s))  Culture, blood (routine x 2)     Status: None (Preliminary result)   Collection Time: 06/03/14  1:25 PM  Result Value Ref Range Status   Specimen Description BLOOD RIGHT AC  Final   Special Requests BOTTLES DRAWN AEROBIC AND ANAEROBIC 5CC BOTH  Final   Culture   Final           BLOOD CULTURE RECEIVED NO GROWTH TO DATE CULTURE WILL BE HELD FOR 5 DAYS BEFORE ISSUING A FINAL NEGATIVE REPORT Performed at [facility]    Report Status PENDING  Incomplete  Culture, blood (routine x 2)     Status: None (Preliminary result)   Collection Time: 06/03/14  2:00 PM  Result Value Ref Range Status   Specimen Description BLOOD LEFT ARM  Final   Special Requests BOTTLES DRAWN AEROBIC AND ANAEROBIC 5CC BOTH  Final  Culture   Final           BLOOD CULTURE RECEIVED NO GROWTH TO DATE CULTURE WILL BE HELD FOR 5 DAYS BEFORE ISSUING A FINAL NEGATIVE REPORT Performed at [facility]    Report Status PENDING  Incomplete     Studies: Dg Foot Complete Right  06-27-14   CLINICAL DATA:  Plantar foot wound  EXAM: RIGHT FOOT COMPLETE - 3+ VIEW  COMPARISON:  02/11/2013  FINDINGS: Generalized soft tissue swelling is noted about the tarsal bones. Significant tarsal degenerative changes are noted but stable from the prior exam. These likely represent Charcot joints. Soft tissue prominence is noted in the plantar aspect of the foot consistent with the current history. Vascular calcifications are noted.  IMPRESSION: Soft tissue wound on the plantar aspect of the  foot.  Chronic changes in the tarsal bones stable from the previous exam.   Electronically Signed   By: Inez Catalina M.D.   On: 06-27-2014 13:54    Scheduled Meds: . aspirin EC  81 mg Oral Daily  . atorvastatin  10 mg Oral q1800  . aztreonam  2 g Intravenous Q8H  . feeding supplement (GLUCERNA SHAKE)  237 mL Oral BID BM  . insulin aspart  0-9 Units Subcutaneous TID WC  . levothyroxine  200 mcg Oral QAC breakfast  . pantoprazole  40 mg Oral Daily  . vancomycin  750 mg Intravenous Q12H   Continuous Infusions: . sodium chloride 75 mL/hr at 06/04/14 0941    Principal Problem:   Diabetic foot infection Active Problems:   Hypothyroidism   Peripheral vascular disease   Abscess of foot    Time spent: 35 minutes.     Niel Hummer A  [facility] Pager [phone]. If 7PM-7AM, please contact night-coverage at [email or web address], password [facility] 06/04/2014, 3:49 PM  LOS: 1 day

## 2014-06-04 NOTE — Anesthesia Procedure Notes (Signed)
Anesthesia Regional Block:  Popliteal block  Pre-Anesthetic Checklist: ,, timeout performed, Correct Patient, Correct Site, Correct Laterality, Correct Procedure, Correct Position, site marked, Risks and benefits discussed, Surgical consent,  Pre-op evaluation,  Post-op pain management  Laterality: Right  Prep: chloraprep       Needles:  Injection technique: Single-shot  Needle Type: Stimiplex     Needle Length: 10cm 10 cm Needle Gauge: 21 and 21 G    Additional Needles:  Procedures: ultrasound guided (picture in chart) and nerve stimulator  Motor weakness within 5 minutes. Popliteal block  Nerve Stimulator or Paresthesia:  Response: Plantar flexion/toe flexion, 0.5 mA,   Additional Responses:   Narrative:  Injection made incrementally with aspirations every 5 mL.  Performed by: Personally  Anesthesiologist: Nolon Nations  Additional Notes: Nerve located and needle positioned with direct ultrasound guidance. Good perineural spread. Patient tolerated well.   Anesthesia Regional Block:  Adductor canal block  Pre-Anesthetic Checklist: ,, timeout performed, Correct Patient, Correct Site, Correct Laterality, Correct Procedure, Correct Position, site marked, Risks and benefits discussed, Surgical consent,  Pre-op evaluation,  Post-op pain management  Laterality: Right  Prep: chloraprep       Needles:  Injection technique: Single-shot  Needle Type: Stimiplex     Needle Length: 9cm 9 cm Needle Gauge: 21 and 21 G    Additional Needles:  Procedures: ultrasound guided (picture in chart) Adductor canal block Narrative:  Injection made incrementally with aspirations every 5 mL.  Performed by: Personally  Anesthesiologist: Nolon Nations  Additional Notes: BP cuff, EKG monitors applied. Sedation begun. Artery and nerve location verified with U/S and anesthetic injected incrementally, slowly, and after negative aspirations under direct u/s guidance. Good  fascial /perineural spread. Tolerated well.

## 2014-06-04 NOTE — Anesthesia Postprocedure Evaluation (Signed)
Anesthesia Post Note  Patient: [name]  Procedure(s) Performed: Procedure(s) (LRB): IRRIGATION AND DEBRIDEMENT EXTREMITY (Right)  Anesthesia type: general  Patient location: PACU  Post pain: Pain level controlled  Post assessment: Patient's Cardiovascular Status Stable  Last Vitals:  Filed Vitals:   06/04/14 2115  BP: 145/71  Pulse: 67  Temp: 36.9 C  Resp: 16    Post vital signs: Reviewed and stable  Level of consciousness: sedated  Complications: No apparent anesthesia complications

## 2014-06-04 NOTE — Brief Op Note (Signed)
06/03/2014 - 06/04/2014  6:16 PM  PATIENT:  [name]  64 y.o. male  PRE-OPERATIVE DIAGNOSIS:  Right Foot Abscess  POST-OPERATIVE DIAGNOSIS:  Right Foot Abscess  PROCEDURE:  Procedure(s): IRRIGATION AND DEBRIDEMENT EXTREMITY (Right)  SURGEON:  Surgeon(s) and Role:    * [name], MD - Primary  ANESTHESIA:   regional and IV sedation  EBL:   minimal  BLOOD ADMINISTERED:none  DRAINS: none   LOCAL MEDICATIONS USED:  NONE  SPECIMEN:  No Specimen  DISPOSITION OF SPECIMEN:  N/A  COUNTS:  YES  TOURNIQUET:    DICTATION: .Other Dictation: Dictation Number [id number]  PLAN OF CARE: Admit to inpatient   PATIENT DISPOSITION:  PACU - hemodynamically stable.   Delay start of Pharmacological VTE agent (>24hrs) due to surgical blood loss or risk of bleeding: no

## 2014-06-04 NOTE — Care Management Note (Signed)
Case Management Note  Patient Details  Name: [name] MRN: [id number] Date of Birth: 18-May-1950  Subjective/Objective:                    Action/Plan: UR completed.   Expected Discharge Date:  06/06/14               Expected Discharge Plan:  Home/Self Care  In-House Referral:     Discharge planning Services     Post Acute Care Choice:    Choice offered to:     DME Arranged:    DME Agency:     HH Arranged:    HH Agency:     Status of Service:  In process, will continue to follow  Medicare Important Message Given:    Date Medicare IM Given:    Medicare IM give by:    Date Additional Medicare IM Given:    Additional Medicare Important Message give by:     If discussed at [place] of Stay Meetings, dates discussed:    Additional Comments:  Marilu Favre, RN 06/04/2014, 9:55 AM

## 2014-06-04 NOTE — Consult Note (Signed)
  [name] understands fully that we are proceeding to the OR now for an irrigation and debridement of his right foot due to an infection with abscess.  The risks and benefits of surgery has been discussed and informed consent obtained.

## 2014-06-04 NOTE — Anesthesia Preprocedure Evaluation (Addendum)
Anesthesia Evaluation  Patient identified by MRN, date of birth, ID band Patient awake    Reviewed: Allergy & Precautions, NPO status , Patient's Chart, lab work & pertinent test results  Airway Mallampati: II  TM Distance: >3 FB Neck ROM: Full    Dental no notable dental hx.    Pulmonary sleep apnea ,  breath sounds clear to auscultation  Pulmonary exam normal       Cardiovascular hypertension, Pt. on medications + CAD, + Past MI, + Cardiac Stents and + Peripheral Vascular Disease Normal cardiovascular examRhythm:Regular Rate:Normal     Neuro/Psych negative neurological ROS  negative psych ROS   GI/Hepatic Neg liver ROS, GERD-  Medicated,  Endo/Other  diabetes, Well Controlled, Type 2Hypothyroidism   Renal/GU negative Renal ROS     Musculoskeletal  (+) Arthritis -,   Abdominal   Peds  Hematology negative hematology ROS (+)   Anesthesia Other Findings   Reproductive/Obstetrics negative OB ROS                           Anesthesia Physical Anesthesia Plan  ASA: III  Anesthesia Plan: Regional, General and MAC   Post-op Pain Management:    Induction: Intravenous  Airway Management Planned:   Additional Equipment:   Intra-op Plan:   Post-operative Plan:   Informed Consent: I have reviewed the patients History and Physical, chart, labs and discussed the procedure including the risks, benefits and alternatives for the proposed anesthesia with the patient or authorized representative who has indicated his/her understanding and acceptance.   Dental advisory given  Plan Discussed with: CRNA  Anesthesia Plan Comments:        Anesthesia Quick Evaluation

## 2014-06-04 NOTE — Transfer of Care (Signed)
Immediate Anesthesia Transfer of Care Note  Patient: [name]  Procedure(s) Performed: Procedure(s): IRRIGATION AND DEBRIDEMENT EXTREMITY (Right)  Patient Location: PACU  Anesthesia Type:MAC and Regional  Level of Consciousness: awake, alert  and oriented  Airway & Oxygen Therapy: Patient Spontanous Breathing  Post-op Assessment: Report given to RN and Post -op Vital signs reviewed and stable  Post vital signs: Reviewed and stable  Last Vitals:  Filed Vitals:   06/04/14 1735  BP: 124/60  Pulse: 54  Temp:   Resp: 16    Complications: No apparent anesthesia complications

## 2014-06-05 LAB — CBC
HCT: 36.4 % — ABNORMAL LOW (ref 39.0–52.0)
Hemoglobin: 12.5 g/dL — ABNORMAL LOW (ref 13.0–17.0)
MCH: 31 pg (ref 26.0–34.0)
MCHC: 34.3 g/dL (ref 30.0–36.0)
MCV: 90.3 fL (ref 78.0–100.0)
Platelets: 201 10*3/uL (ref 150–400)
RBC: 4.03 MIL/uL — ABNORMAL LOW (ref 4.22–5.81)
RDW: 12.9 % (ref 11.5–15.5)
WBC: 9.2 10*3/uL (ref 4.0–10.5)

## 2014-06-05 LAB — GLUCOSE, CAPILLARY
Glucose-Capillary: 163 mg/dL — ABNORMAL HIGH (ref 65–99)
Glucose-Capillary: 184 mg/dL — ABNORMAL HIGH (ref 65–99)
Glucose-Capillary: 142 mg/dL — ABNORMAL HIGH (ref 65–99)
Glucose-Capillary: 129 mg/dL — ABNORMAL HIGH (ref 65–99)

## 2014-06-05 LAB — HEMOGLOBIN A1C
Hgb A1c MFr Bld: 7 % — ABNORMAL HIGH (ref 4.8–5.6)
Mean Plasma Glucose: 154 mg/dL

## 2014-06-05 NOTE — Care Management (Addendum)
Patient and wife have changed their mind on walker . They do want knee walker .   Gave patient and wife prices from [place] and [employer] . They have called [facility] and decided to rent knee scooter / walker for a month.   Magdalen Spatz RN BSN      PT recommendations : Rolling walker with 5" wheels (may consider knee scooter if patient desires)    Discussed with patient , he prefers rolling walker .  Ordered .   Magdalen Spatz RN BSN [phone]

## 2014-06-05 NOTE — Progress Notes (Signed)
Patient ID: [name], male   DOB: 08-Apr-1950, 64 y.o.   MRN: [id number] Doing well overall.  I did wash out his right foot last evening.  The plan will be for continued IV antibiotics today and discharge tomorrow on oral antibiotics.  I'll see him tomorrow am for discharge.

## 2014-06-05 NOTE — Care Management Note (Signed)
Case Management Note  Patient Details  Name: [name] MRN: [id number] Date of Birth: 12/04/1950  Subjective/Objective:                    Action/Plan:  UR updated , plan DC home tomorrow on PO antibiotics  Expected Discharge Date:  06/06/14               Expected Discharge Plan:  Home/Self Care  In-House Referral:     Discharge planning Services     Post Acute Care Choice:    Choice offered to:     DME Arranged:    DME Agency:     HH Arranged:    [place] Agency:     Status of Service:  In process, will continue to follow  Medicare Important Message Given:    Date Medicare IM Given:    Medicare IM give by:    Date Additional Medicare IM Given:    Additional Medicare Important Message give by:     If discussed at [place] of Stay Meetings, dates discussed:    Additional Comments:  Marilu Favre, RN 06/05/2014, 8:40 AM

## 2014-06-05 NOTE — Evaluation (Signed)
Physical Therapy Evaluation Patient Details Name: [name] MRN: [id number] DOB: Dec 14, 1950 Today's Date: 06/05/2014   History of Present Illness  Patient is a 64 yo male s/p irrigation and debridement of R plantar ulcer and abcess.  Clinical Impression  Patient seen for mobility training and education UR:KYHCWC RLE. Patient mobilizing well with RW. Educated on technique for stair negotiation. Patient reports very active, may benefit from use of a knee scooter for mobility if he desires. Will continue to see and progress as tolerated.    Follow Up Recommendations No PT follow up    Equipment Recommendations  Rolling walker with 5" wheels (may consider knee scooter if patient desires)    Recommendations for Other Services       Precautions / Restrictions Precautions Precautions: Fall Restrictions Weight Bearing Restrictions: Yes RLE Weight Bearing: Non weight bearing      Mobility  Bed Mobility Overal bed mobility: Modified Independent                Transfers Overall transfer level: Needs assistance Equipment used: Rolling walker (2 wheeled) Transfers: Sit to/from Stand Sit to Stand: Supervision            Ambulation/Gait Ambulation/Gait assistance: Supervision;Min guard Ambulation Distance (Feet): 40 Feet Assistive device: Rolling walker (2 wheeled) Gait Pattern/deviations: Step-to pattern Gait velocity: decreased   General Gait Details: patient cued for technique and sequencing, patient able to perform with good compliance  Stairs Stairs: Yes Stairs assistance: Min assist Stair Management: Backwards;With walker Number of Stairs: 2 General stair comments: educated on technique, assist required for walker stabilization. Patient educated on bumo up method as well for stair negotaiton.  Wheelchair Mobility    Modified Rankin (Stroke Patients Only)       Balance Overall balance assessment: No apparent balance deficits (not formally assessed) (able  to perform multiple activities in single leg stance)                                           Pertinent Vitals/Pain Pain Assessment: No/denies pain    Home Living Family/patient expects to be discharged to:: Private residence Living Arrangements: Spouse/significant other Available Help at Discharge: Family Type of Home: House Home Access: Stairs to enter Entrance Stairs-Rails: None Technical brewer of Steps: 2 Home Layout: Able to live on main level with bedroom/bathroom Home Equipment: [place] - single point;Crutches      Prior Function Level of Independence: Independent               Hand Dominance   Dominant Hand: Right    Extremity/Trunk Assessment   Upper Extremity Assessment: Overall WFL for tasks assessed           Lower Extremity Assessment: RLE deficits/detail         Communication   Communication: No difficulties  Cognition Arousal/Alertness: Awake/alert Behavior During Therapy: WFL for tasks assessed/performed Overall Cognitive Status: Within Functional Limits for tasks assessed                      General Comments      Exercises        Assessment/Plan    PT Assessment Patient needs continued PT services  PT Diagnosis Difficulty walking;Abnormality of gait   PT Problem List Decreased range of motion;Decreased activity tolerance;Decreased mobility  PT Treatment Interventions DME instruction;Gait training;Stair training;Functional mobility training;Therapeutic activities;Therapeutic exercise;Balance training;Patient/family  education   PT Goals (Current goals can be found in the Care Plan section) Acute Rehab PT Goals Patient Stated Goal: to go home PT Goal Formulation: With patient Time For Goal Achievement: 06/19/14 Potential to Achieve Goals: Good    Frequency Min 3X/week   Barriers to discharge        Co-evaluation               End of Session Equipment Utilized During Treatment: Gait  belt Activity Tolerance: Patient tolerated treatment well Patient left: in chair;with call bell/phone within reach Nurse Communication: Mobility status         Time: 9201-0071 PT Time Calculation (min) (ACUTE ONLY): 25 min   Charges:   PT Evaluation $Initial PT Evaluation Tier I: 1 Procedure PT Treatments $Gait Training: 8-22 mins   PT G CodesDuncan Dull 07/04/14, 10:24 AM Alben Deeds, PT DPT  [phone]

## 2014-06-05 NOTE — Progress Notes (Signed)
[facility] PROGRESS NOTE  [name] LKT:[id number] DOB: Jul 09, 1950 DOA: 06/03/2014 PCP: Irven Shelling, MD  Assessment/Plan: 1- Right foot wound with possible abscess S/P: Irrigation, excisional, and debridement of right foot wound and abscess with sharp excisional debridement of skin, soft tissue, and fascia only. -Continue with vancomycin and Aztreonam.  -Blood culture no growth to date.  -Abscess culture: growing gram positive cocci in cluster. Gram stain Gram positive cocci in pairs.   2-Past history of diabetes, currently not on meds, now with hyperglycemia:  Continue with SSI. Follow Hb-A1c at 7. Might need to be discharge on metformin.   3-Hypoproteinemia;  start Glucerna;/   Code Status: Full Code.  Family Communication: care discussed with wife who was at bedside.  Disposition Plan: Remain inpatient, OR today.    Consultants:  Dr [name].   Procedures:  none  Antibiotics:  IV vancomycin 5-11  Aztreonam 5-11  HPI/Subjective: Pain is better. No new complaints. I inform him Hb A1c test results    Objective: Filed Vitals:   06/05/14 1440  BP: 117/71  Pulse: 89  Temp: 98.3 F (36.8 C)  Resp: 18    Intake/Output Summary (Last 24 hours) at 06/05/14 1508 Last data filed at 06/05/14 1441  Gross per 24 hour  Intake    910 ml  Output    700 ml  Net    210 ml   Filed Weights   06/03/14 1126 06/03/14 1731  Weight: 109.77 kg (242 lb) 112.492 kg (248 lb)    Exam:   General:  Alert in no distress.   Cardiovascular: S 1, S 2 RRR  Respiratory: CTA  Abdomen: BS present, soft, nt  Musculoskeletal: right foot with dressing.   Data Reviewed: Basic Metabolic Panel:  Recent Labs Lab 06/03/14 1324 06/04/14 0340  NA 139 137  K 4.5 3.6  CL 102 104  CO2 28 23  GLUCOSE 176* 188*  BUN 24* 14  CREATININE 0.92 0.94  CALCIUM 9.1 8.6*   Liver Function Tests:  Recent Labs Lab 06/03/14 1324 06/04/14 0340  AST 15 15  ALT 12* 12*   ALKPHOS 64 58  BILITOT 1.3* 1.3*  PROT 7.3 7.0  ALBUMIN 3.6 3.0*   No results for input(s): LIPASE, AMYLASE in the last 168 hours. No results for input(s): AMMONIA in the last 168 hours. CBC:  Recent Labs Lab 06/03/14 1324 06/04/14 0340 06/05/14 0301  WBC 12.6* 11.0* 9.2  NEUTROABS 10.9*  --   --   HGB 14.5 13.7 12.5*  HCT 42.1 39.8 36.4*  MCV 92.5 90.9 90.3  PLT 183 190 201   Cardiac Enzymes: No results for input(s): CKTOTAL, CKMB, CKMBINDEX, TROPONINI in the last 168 hours. BNP (last 3 results) No results for input(s): BNP in the last 8760 hours.  ProBNP (last 3 results) No results for input(s): PROBNP in the last 8760 hours.  CBG:  Recent Labs Lab 06/04/14 1609 06/04/14 1832 06/04/14 2115 06/05/14 0747 06/05/14 1152  GLUCAP 163* 140* 159* 163* 184*    Recent Results (from the past 240 hour(s))  Culture, blood (routine x 2)     Status: None (Preliminary result)   Collection Time: 06/03/14  1:25 PM  Result Value Ref Range Status   Specimen Description BLOOD RIGHT AC  Final   Special Requests BOTTLES DRAWN AEROBIC AND ANAEROBIC 5CC BOTH  Final   Culture   Final           BLOOD CULTURE RECEIVED NO GROWTH TO DATE CULTURE WILL BE  HELD FOR 5 DAYS BEFORE ISSUING A FINAL NEGATIVE REPORT Performed at [facility]    Report Status PENDING  Incomplete  Culture, blood (routine x 2)     Status: None (Preliminary result)   Collection Time: 06/03/14  2:00 PM  Result Value Ref Range Status   Specimen Description BLOOD LEFT ARM  Final   Special Requests BOTTLES DRAWN AEROBIC AND ANAEROBIC 5CC BOTH  Final   Culture   Final           BLOOD CULTURE RECEIVED NO GROWTH TO DATE CULTURE WILL BE HELD FOR 5 DAYS BEFORE ISSUING A FINAL NEGATIVE REPORT Performed at [facility]    Report Status PENDING  Incomplete  Surgical pcr screen     Status: Abnormal   Collection Time: 06/04/14  5:26 PM  Result Value Ref Range Status   MRSA, PCR NEGATIVE NEGATIVE Final    Staphylococcus aureus POSITIVE (A) NEGATIVE Final    Comment:        The Xpert SA Assay (FDA approved for NASAL specimens in patients over 70 years of age), is one component of a comprehensive surveillance program.  Test performance has been validated by [facility] for patients greater than or equal to 2 year old. It is not intended to diagnose infection nor to guide or monitor treatment.   AFB culture with smear     Status: None (Preliminary result)   Collection Time: 06/04/14  5:56 PM  Result Value Ref Range Status   Specimen Description ABSCESS RIGHT FOOT  Final   Special Requests PATIENT ON FOLLOWING VANC  Final   Acid Fast Smear   Final    NO ACID FAST BACILLI SEEN Performed at [facility]    Culture   Final    CULTURE WILL BE EXAMINED FOR 6 WEEKS BEFORE ISSUING A FINAL REPORT Performed at [facility]    Report Status PENDING  Incomplete  Anaerobic culture     Status: None (Preliminary result)   Collection Time: 06/04/14  5:56 PM  Result Value Ref Range Status   Specimen Description ABSCESS RIGHT FOOT  Final   Special Requests PATIENT ON FOLLOWING VANC  Final   Gram Stain   Final    MODERATE WBC PRESENT, PREDOMINANTLY PMN NO SQUAMOUS EPITHELIAL CELLS SEEN MODERATE GRAM POSITIVE COCCI IN CLUSTERS Performed at [facility]    Culture   Final    NO ANAEROBES ISOLATED; CULTURE IN PROGRESS FOR 5 DAYS Performed at [facility]    Report Status PENDING  Incomplete  Culture, routine-abscess     Status: None (Preliminary result)   Collection Time: 06/04/14  5:56 PM  Result Value Ref Range Status   Specimen Description ABSCESS RIGHT FOOT  Final   Special Requests PATIENT ON FOLLOWING VANC  Final   Gram Stain   Final    MODERATE WBC PRESENT, PREDOMINANTLY PMN NO SQUAMOUS EPITHELIAL CELLS SEEN MODERATE GRAM POSITIVE COCCI IN PAIRS Performed at [facility]    Culture PENDING  Incomplete   Report Status PENDING  Incomplete      Studies: No results found.  Scheduled Meds: . aspirin EC  81 mg Oral Daily  . atorvastatin  10 mg Oral q1800  . aztreonam  2 g Intravenous Q8H  . feeding supplement (GLUCERNA SHAKE)  237 mL Oral BID BM  . insulin aspart  0-9 Units Subcutaneous TID WC  . levothyroxine  200 mcg Oral QAC breakfast  . pantoprazole  40  mg Oral Daily  . vancomycin  750 mg Intravenous Q12H   Continuous Infusions: . sodium chloride 75 mL/hr at 06/05/14 0528  . lactated ringers 10 mL/hr at 06/04/14 1700    Principal Problem:   Diabetic foot infection Active Problems:   Hypothyroidism   Peripheral vascular disease   Abscess of foot    Time spent: 30 minutes.     Niel Hummer A  [facility] Pager [phone]. If 7PM-7AM, please contact night-coverage at [email or web address], password [facility] 06/05/2014, 3:08 PM  LOS: 2 days

## 2014-06-05 NOTE — Op Note (Signed)
[name], [name]                   ACCOUNT NO.:  [id number]  MEDICAL RECORD NO.:  [id number]  LOCATION:  [id number]                        FACILITY:  [facility]  PHYSICIAN:  Lind Guest. [name], M.D.DATE OF BIRTH:  1950-09-30  DATE OF PROCEDURE:  06/04/2014 DATE OF DISCHARGE:                              OPERATIVE REPORT   PREOPERATIVE DIAGNOSIS:  Right plantar foot ulcer with abscess.  POSTOPERATIVE DIAGNOSIS:  Right plantar foot ulcer with abscess.  PROCEDURE:  Irrigation, excisional, and debridement of right foot wound and abscess with sharp excisional debridement of skin, soft tissue, and fascia only.  FINDINGS:  Diabetic foot ulcer, right foot plantar surface with small abscess.  CULTURES:  Pending.  SURGEON:  Lind Guest. [name], M.D.  ANESTHESIA: 1. Regional right lower extremity block. 2. Mask ventilation, IV sedation.  BLOOD LOSS:  Minimal.  COMPLICATIONS:  None.  INDICATIONS:  [name] is a 64 year old gentleman well known to me.  He is a long-term diabetic with neuropathy who has had a successful gastric bypass surgery and has a hemoglobin A1c that is less than 7.  However, he has had Charcot changes in his right foot for sometime now and had been actually seen at the [facility] in [place] for a small wound on his great toe on the right foot.  However, 24 hours prior to admission yesterday, he developed right ankle swelling and redness and noticed a blushing area on the bottom of his right foot.  Surprisingly, there was no bony prominence in this area, but he was admitted for IV antibiotics, and upon consultation I saw him and recommended irrigation and debridement of this with an obvious abscess.  His peripheral white blood cell count was 12,000.  He has been on vancomycin mainly, and he presents for irrigation and debridement of this right foot wound.  He understands the necessity with proceeding to the OR.  It has become painful to him, and given  the fact that he does have peripheral neuropathy, that did raise our suspicion that this is certainly worse.  PROCEDURE DESCRIPTION:  After informed consent was obtained, the appropriate right foot was marked.  Anesthesia was obtained as regional anesthesia.  He was then brought to the operating room, placed supine on the operating table.  Mask ventilation with IV sedation obtained.  His right foot, ankle, calf, and shin were prepped and draped with DuraPrep and sterile drapes.  Time-out was called.  He was identified as correct patient, correct right foot.  I then made an incision directly over the abscess area on the plantar aspect of his foot and did find some gross purulence.  I was able to use a #10 blade to excise necrotic skin, soft tissue, and fascia but did not see any bony or muscle involvement.  Once I removed necrotic tissue, I then irrigated the plantar aspect of his foot with normal saline solution using pulsatile lavage, and after placing 3 L of saline through the wound, I then loosely approximated some of the skin with interrupted 2-0 nylon suture.  I then placed Xeroform and well-padded sterile dressing around the foot and ankle.  He was taken to recovery room in stable  condition.  All final counts were correct.  There were no complications noted.  Postoperatively, he will need 1 more day of IV antibiotics prior to discharge to home on oral antibiotics and with keeping the weight off his right foot.     Lind Guest. [name], M.D.     CYB/MEDQ  D:  06/04/2014  T:  06/05/2014  Job:  [id number]

## 2014-06-06 LAB — CBC
HCT: 41.9 % (ref 39.0–52.0)
Hemoglobin: 14.5 g/dL (ref 13.0–17.0)
MCH: 31.4 pg (ref 26.0–34.0)
MCHC: 34.6 g/dL (ref 30.0–36.0)
MCV: 90.7 fL (ref 78.0–100.0)
Platelets: 213 10*3/uL (ref 150–400)
RBC: 4.62 MIL/uL (ref 4.22–5.81)
RDW: 13 % (ref 11.5–15.5)
WBC: 7.8 10*3/uL (ref 4.0–10.5)

## 2014-06-06 LAB — CREATININE, SERUM
Creatinine, Ser: 0.82 mg/dL (ref 0.61–1.24)
GFR calc Af Amer: >60 mL/min (ref >60)
GFR calc non Af Amer: >60 mL/min (ref >60)

## 2014-06-06 LAB — GLUCOSE, CAPILLARY
Glucose-Capillary: 170 mg/dL — ABNORMAL HIGH (ref 65–99)
Glucose-Capillary: 142 mg/dL — ABNORMAL HIGH (ref 65–99)
Glucose-Capillary: 145 mg/dL — ABNORMAL HIGH (ref 65–99)
Glucose-Capillary: 138 mg/dL — ABNORMAL HIGH (ref 65–99)

## 2014-06-06 LAB — VANCOMYCIN, TROUGH: Vancomycin Tr: 6 ug/mL — ABNORMAL LOW (ref 10.0–20.0)

## 2014-06-06 MED ORDER — SODIUM CHLORIDE 0.9 % IV SOLN
500.0000 mg | Freq: Once | INTRAVENOUS | Status: AC
  Administered 2014-06-06: 500 mg via INTRAVENOUS
  Filled 2014-06-06: qty 500

## 2014-06-06 MED ORDER — SODIUM CHLORIDE 0.9 % IV SOLN
1250.0000 mg | Freq: Two times a day (BID) | INTRAVENOUS | Status: DC
  Administered 2014-06-07 – 2014-06-08 (×3): 1250 mg via INTRAVENOUS
  Filled 2014-06-06 (×4): qty 1250

## 2014-06-06 NOTE — Progress Notes (Signed)
ANTIBIOTIC CONSULT NOTE - FOLLOW UP  Pharmacy Consult for Vancomycin + Azactam Indication: R-foot wound/cellulitis  Allergies  Allergen Reactions  . Quinolones Itching  . Bactrim [Sulfamethoxazole-Trimethoprim] Itching and Rash  . Moxifloxacin Rash  . Penicillin G Rash  . Penicillins Rash    Patient Measurements: Height: 6\' 8"  (203.2 cm) Weight: 248 lb (112.492 kg) IBW/kg (Calculated) : 96  Vital Signs: Temp: 98.1 F (36.7 C) (05/14 1425) Temp Source: Oral (05/14 1425) BP: 124/66 mmHg (05/14 1425) Pulse Rate: 89 (05/14 1425) Intake/Output from previous day: 05/13 0701 - 05/14 0700 In: 2715 [P.O.:560; I.V.:1705; IV Piggyback:450] Out: 1350 [MVHQI:6962] Intake/Output from this shift: Total I/O In: 580 [P.O.:480; IV Piggyback:100] Out: 1150 [Urine:1150]  Labs:  Recent Labs  06/04/14 0340 06/05/14 0301 06/06/14 1104 06/06/14 1553  WBC 11.0* 9.2 7.8  --   HGB 13.7 12.5* 14.5  --   PLT 190 201 213  --   CREATININE 0.94  --   --  0.82   Estimated Creatinine Clearance: 122 mL/min (by C-G formula based on Cr of 0.82).  Recent Labs  06/06/14 1553  [facility] 6*     Microbiology: Recent Results (from the past 720 hour(s))  Culture, blood (routine x 2)     Status: None (Preliminary result)   Collection Time: 06/03/14  1:25 PM  Result Value Ref Range Status   Specimen Description BLOOD RIGHT AC  Final   Special Requests BOTTLES DRAWN AEROBIC AND ANAEROBIC 5CC BOTH  Final   Culture   Final           BLOOD CULTURE RECEIVED NO GROWTH TO DATE CULTURE WILL BE HELD FOR 5 DAYS BEFORE ISSUING A FINAL NEGATIVE REPORT Performed at [facility]    Report Status PENDING  Incomplete  Culture, blood (routine x 2)     Status: None (Preliminary result)   Collection Time: 06/03/14  2:00 PM  Result Value Ref Range Status   Specimen Description BLOOD LEFT ARM  Final   Special Requests BOTTLES DRAWN AEROBIC AND ANAEROBIC 5CC BOTH  Final   Culture   Final   BLOOD CULTURE RECEIVED NO GROWTH TO DATE CULTURE WILL BE HELD FOR 5 DAYS BEFORE ISSUING A FINAL NEGATIVE REPORT Performed at [facility]    Report Status PENDING  Incomplete  Surgical pcr screen     Status: Abnormal   Collection Time: 06/04/14  5:26 PM  Result Value Ref Range Status   MRSA, PCR NEGATIVE NEGATIVE Final   Staphylococcus aureus POSITIVE (A) NEGATIVE Final    Comment:        The Xpert SA Assay (FDA approved for NASAL specimens in patients over 65 years of age), is one component of a comprehensive surveillance program.  Test performance has been validated by [facility] for patients greater than or equal to 79 year old. It is not intended to diagnose infection nor to guide or monitor treatment.   AFB culture with smear     Status: None (Preliminary result)   Collection Time: 06/04/14  5:56 PM  Result Value Ref Range Status   Specimen Description ABSCESS RIGHT FOOT  Final   Special Requests PATIENT ON FOLLOWING VANC  Final   Acid Fast Smear   Final    NO ACID FAST BACILLI SEEN Performed at [facility]    Culture   Final    CULTURE WILL BE EXAMINED FOR 6 WEEKS BEFORE ISSUING A FINAL REPORT Performed at [facility]    Report  Status PENDING  Incomplete  Anaerobic culture     Status: None (Preliminary result)   Collection Time: 06/04/14  5:56 PM  Result Value Ref Range Status   Specimen Description ABSCESS RIGHT FOOT  Final   Special Requests PATIENT ON FOLLOWING VANC  Final   Gram Stain   Final    MODERATE WBC PRESENT, PREDOMINANTLY PMN NO SQUAMOUS EPITHELIAL CELLS SEEN MODERATE GRAM POSITIVE COCCI IN CLUSTERS Performed at [facility]    Culture   Final    NO ANAEROBES ISOLATED; CULTURE IN PROGRESS FOR 5 DAYS Performed at [facility]    Report Status PENDING  Incomplete  Culture, routine-abscess     Status: None (Preliminary result)   Collection Time: 06/04/14  5:56 PM  Result Value Ref Range Status   Specimen  Description ABSCESS RIGHT FOOT  Final   Special Requests PATIENT ON FOLLOWING VANC  Final   Gram Stain   Final    MODERATE WBC PRESENT, PREDOMINANTLY PMN NO SQUAMOUS EPITHELIAL CELLS SEEN MODERATE GRAM POSITIVE COCCI IN PAIRS Performed at [facility]    Culture   Final    FEW GROUP B STREP(S.AGALACTIAE)ISOLATED Note: TESTING AGAINST S. AGALACTIAE NOT ROUTINELY PERFORMED DUE TO PREDICTABILITY OF AMP/PEN/VAN SUSCEPTIBILITY. Performed at [facility]    Report Status PENDING  Incomplete    Anti-infectives    Start     Dose/Rate Route Frequency Ordered Stop   06/07/14 0800  vancomycin (VANCOCIN) 1,250 mg in sodium chloride 0.9 % 250 mL IVPB     1,250 mg 166.7 mL/hr over 90 Minutes Intravenous Every 12 hours 06/06/14 1734     06/06/14 1830  vancomycin (VANCOCIN) 500 mg in sodium chloride 0.9 % 100 mL IVPB     500 mg 100 mL/hr over 60 Minutes Intravenous  Once 06/06/14 1733     06/04/14 0600  vancomycin (VANCOCIN) IVPB 750 mg/150 ml premix  Status:  Discontinued     750 mg 150 mL/hr over 60 Minutes Intravenous Every 12 hours 06/03/14 1912 06/03/14 2042   06/04/14 0300  vancomycin (VANCOCIN) IVPB 750 mg/150 ml premix  Status:  Discontinued     750 mg 150 mL/hr over 60 Minutes Intravenous Every 12 hours 06/03/14 2042 06/06/14 1734   06/03/14 1915  vancomycin (VANCOCIN) IVPB 1000 mg/200 mL premix  Status:  Discontinued     1,000 mg 200 mL/hr over 60 Minutes Intravenous  Once 06/03/14 1912 06/03/14 2041   06/03/14 1530  aztreonam (AZACTAM) 2 g in dextrose 5 % 50 mL IVPB     2 g 100 mL/hr over 30 Minutes Intravenous Every 8 hours 06/03/14 1506     06/03/14 1515  metroNIDAZOLE (FLAGYL) IVPB 500 mg     500 mg 100 mL/hr over 60 Minutes Intravenous  Once 06/03/14 1503 06/03/14 1706   06/03/14 1500  vancomycin (VANCOCIN) IVPB 1000 mg/200 mL premix     1,000 mg 200 mL/hr over 60 Minutes Intravenous  Once 06/03/14 1445 06/03/14 1606   06/03/14 1500  meropenem (MERREM) 1 g in  sodium chloride 0.9 % 100 mL IVPB  Status:  Discontinued     1 g 200 mL/hr over 30 Minutes Intravenous Every 8 hours 06/03/14 1457 06/03/14 1503      Assessment: 64 YOM who continues on Vancomycin + Azactam for empiric R-foot wound/cellulitis with abscess, s/p I&D by ortho on 5/13. Wound cultures now growing Group B Strep. A Vancomycin trough this evening was SUBtherapeutic (VT 6 mcg/ml, goal of 10-15  mcg/ml). Will increase the dose.  Noted plans for discharge on oral antibiotics. Group B Strep is well covered by B-lactams. Given the patient's penicillin allergy as only a rash - consider Keflex as an oral option.   Goal of Therapy:  Vancomycin trough level 10-15 mcg/ml  Plan:  1. Given an extra Vancomycin 500 mg x 1 (in addition to scheduled 750 mg dose) for a total dose of 1250 mg this evening 2. Increase Vancomycin to 1250 mg IV every 12 hours (starting on 5/15) 3. Consider discontinuation of Azactam with culture results showing Group B Strep 4. Will f/u plans to change to oral antibiotics on 5/15 5. Will continue to follow renal function, culture results, LOT, and antibiotic de-escalation plans   Alycia Rossetti, PharmD, BCPS Clinical Pharmacist Pager: [phone] 06/06/2014 5:44 PM

## 2014-06-06 NOTE — Progress Notes (Signed)
Patient ID: [name], male   DOB: March 24, 1950, 64 y.o.   MRN: [id number] I changed the right foot dressing and found much less redness and swelling.  His cultures are growing out gram positive cocci in pairs.  I believe he needs IV antibiotics today and tomorrow pending his final culture results.  Anticipate discharge to home then Monday likely on doxycycline or clindamycin.  Will follow and order a CBC as well today.

## 2014-06-06 NOTE — Progress Notes (Signed)
[facility] PROGRESS NOTE  Red Mandt YQM:[id number] DOB: 07-May-1950 DOA: 06/03/2014 PCP: Irven Shelling, MD  Assessment/Plan: 1- Right foot wound with possible abscess S/P: Irrigation, excisional, and debridement of right foot wound and abscess with sharp excisional debridement of skin, soft tissue, and fascia only. -Continue with vancomycin and Aztreonam.  -Blood culture no growth to date.  -Abscess culture: growing gram positive cocci in cluster. Gram stain Gram positive cocci in pairs.   2-Past history of diabetes, currently not on meds, now with hyperglycemia:  Continue with SSI. Follow Hb-A1c at 7. Might need to be discharge on metformin.   3-Hypoproteinemia;  started Glucerna;/   Code Status: Full Code.  Family Communication: care discussed with patient Disposition Plan: home when culture results available.    Consultants:  Dr [name].   Procedures:  none  Antibiotics:  IV vancomycin 5-11  Aztreonam 5-11  HPI/Subjective: Pain is better. No new complaints. Had BM. Using incentive spirometry   Objective: Filed Vitals:   06/06/14 0431  BP: 139/70  Pulse: 63  Temp: 97.6 F (36.4 C)  Resp: 16    Intake/Output Summary (Last 24 hours) at 06/06/14 1419 Last data filed at 06/06/14 1100  Gross per 24 hour  Intake   2380 ml  Output   1500 ml  Net    880 ml   Filed Weights   06/03/14 1126 06/03/14 1731  Weight: 109.77 kg (242 lb) 112.492 kg (248 lb)    Exam:   General:  Alert in no distress.   Cardiovascular: S 1, S 2 RRR  Respiratory: CTA  Abdomen: BS present, soft, nt  Musculoskeletal: right foot with dressing.   Data Reviewed: Basic Metabolic Panel:  Recent Labs Lab 06/03/14 1324 06/04/14 0340  NA 139 137  K 4.5 3.6  CL 102 104  CO2 28 23  GLUCOSE 176* 188*  BUN 24* 14  CREATININE 0.92 0.94  CALCIUM 9.1 8.6*   Liver Function Tests:  Recent Labs Lab 06/03/14 1324 06/04/14 0340  AST 15 15  ALT 12* 12*  ALKPHOS 64  58  BILITOT 1.3* 1.3*  PROT 7.3 7.0  ALBUMIN 3.6 3.0*   No results for input(s): LIPASE, AMYLASE in the last 168 hours. No results for input(s): AMMONIA in the last 168 hours. CBC:  Recent Labs Lab 06/03/14 1324 06/04/14 0340 06/05/14 0301 06/06/14 1104  WBC 12.6* 11.0* 9.2 7.8  NEUTROABS 10.9*  --   --   --   HGB 14.5 13.7 12.5* 14.5  HCT 42.1 39.8 36.4* 41.9  MCV 92.5 90.9 90.3 90.7  PLT 183 190 201 213   Cardiac Enzymes: No results for input(s): CKTOTAL, CKMB, CKMBINDEX, TROPONINI in the last 168 hours. BNP (last 3 results) No results for input(s): BNP in the last 8760 hours.  ProBNP (last 3 results) No results for input(s): PROBNP in the last 8760 hours.  CBG:  Recent Labs Lab 06/05/14 1152 06/05/14 1714 06/05/14 2159 06/06/14 0744 06/06/14 1143  GLUCAP 184* 142* 129* 170* 142*    Recent Results (from the past 240 hour(s))  Culture, blood (routine x 2)     Status: None (Preliminary result)   Collection Time: 06/03/14  1:25 PM  Result Value Ref Range Status   Specimen Description BLOOD RIGHT AC  Final   Special Requests BOTTLES DRAWN AEROBIC AND ANAEROBIC 5CC BOTH  Final   Culture   Final           BLOOD CULTURE RECEIVED NO GROWTH TO DATE CULTURE WILL  BE HELD FOR 5 DAYS BEFORE ISSUING A FINAL NEGATIVE REPORT Performed at [facility]    Report Status PENDING  Incomplete  Culture, blood (routine x 2)     Status: None (Preliminary result)   Collection Time: 06/03/14  2:00 PM  Result Value Ref Range Status   Specimen Description BLOOD LEFT ARM  Final   Special Requests BOTTLES DRAWN AEROBIC AND ANAEROBIC 5CC BOTH  Final   Culture   Final           BLOOD CULTURE RECEIVED NO GROWTH TO DATE CULTURE WILL BE HELD FOR 5 DAYS BEFORE ISSUING A FINAL NEGATIVE REPORT Performed at [facility]    Report Status PENDING  Incomplete  Surgical pcr screen     Status: Abnormal   Collection Time: 06/04/14  5:26 PM  Result Value Ref Range Status   MRSA,  PCR NEGATIVE NEGATIVE Final   Staphylococcus aureus POSITIVE (A) NEGATIVE Final    Comment:        The Xpert SA Assay (FDA approved for NASAL specimens in patients over 51 years of age), is one component of a comprehensive surveillance program.  Test performance has been validated by [facility] for patients greater than or equal to 64 year old. It is not intended to diagnose infection nor to guide or monitor treatment.   AFB culture with smear     Status: None (Preliminary result)   Collection Time: 06/04/14  5:56 PM  Result Value Ref Range Status   Specimen Description ABSCESS RIGHT FOOT  Final   Special Requests PATIENT ON FOLLOWING VANC  Final   Acid Fast Smear   Final    NO ACID FAST BACILLI SEEN Performed at [facility]    Culture   Final    CULTURE WILL BE EXAMINED FOR 6 WEEKS BEFORE ISSUING A FINAL REPORT Performed at [facility]    Report Status PENDING  Incomplete  Anaerobic culture     Status: None (Preliminary result)   Collection Time: 06/04/14  5:56 PM  Result Value Ref Range Status   Specimen Description ABSCESS RIGHT FOOT  Final   Special Requests PATIENT ON FOLLOWING VANC  Final   Gram Stain   Final    MODERATE WBC PRESENT, PREDOMINANTLY PMN NO SQUAMOUS EPITHELIAL CELLS SEEN MODERATE GRAM POSITIVE COCCI IN CLUSTERS Performed at [facility]    Culture   Final    NO ANAEROBES ISOLATED; CULTURE IN PROGRESS FOR 5 DAYS Performed at [facility]    Report Status PENDING  Incomplete  Culture, routine-abscess     Status: None (Preliminary result)   Collection Time: 06/04/14  5:56 PM  Result Value Ref Range Status   Specimen Description ABSCESS RIGHT FOOT  Final   Special Requests PATIENT ON FOLLOWING VANC  Final   Gram Stain   Final    MODERATE WBC PRESENT, PREDOMINANTLY PMN NO SQUAMOUS EPITHELIAL CELLS SEEN MODERATE GRAM POSITIVE COCCI IN PAIRS Performed at [facility]    Culture PENDING  Incomplete    Report Status PENDING  Incomplete     Studies: No results found.  Scheduled Meds: . aspirin EC  81 mg Oral Daily  . atorvastatin  10 mg Oral q1800  . aztreonam  2 g Intravenous Q8H  . feeding supplement (GLUCERNA SHAKE)  237 mL Oral BID BM  . insulin aspart  0-9 Units Subcutaneous TID WC  . levothyroxine  200 mcg Oral QAC breakfast  . pantoprazole  40 mg Oral Daily  . vancomycin  750 mg Intravenous Q12H   Continuous Infusions: . sodium chloride 75 mL/hr at 06/05/14 2322  . lactated ringers 10 mL/hr at 06/04/14 1700    Principal Problem:   Diabetic foot infection Active Problems:   Hypothyroidism   Peripheral vascular disease   Abscess of foot    Time spent: 30 minutes.     Niel Hummer A  [facility] Pager [phone]. If 7PM-7AM, please contact night-coverage at [email or web address], password [facility] 06/06/2014, 2:19 PM  LOS: 3 days

## 2014-06-07 LAB — BASIC METABOLIC PANEL
Anion gap: 11 (ref 5–15)
BUN: 11 mg/dL (ref 6–20)
CO2: 25 mmol/L (ref 22–32)
Calcium: 8.3 mg/dL — ABNORMAL LOW (ref 8.9–10.3)
Chloride: 102 mmol/L (ref 101–111)
Creatinine, Ser: 0.8 mg/dL (ref 0.61–1.24)
GFR calc Af Amer: >60 mL/min (ref >60)
GFR calc non Af Amer: >60 mL/min (ref >60)
Glucose, Bld: 147 mg/dL — ABNORMAL HIGH (ref 65–99)
Potassium: 3.5 mmol/L (ref 3.5–5.1)
Sodium: 138 mmol/L (ref 135–145)

## 2014-06-07 LAB — CULTURE, ROUTINE-ABSCESS

## 2014-06-07 LAB — GLUCOSE, CAPILLARY
Glucose-Capillary: 154 mg/dL — ABNORMAL HIGH (ref 65–99)
Glucose-Capillary: 122 mg/dL — ABNORMAL HIGH (ref 65–99)
Glucose-Capillary: 144 mg/dL — ABNORMAL HIGH (ref 65–99)

## 2014-06-07 NOTE — Progress Notes (Signed)
Patient ID: [name], male   DOB: 08-12-1950, 64 y.o.   MRN: [id number] Feels better and looks better overall.  WBC down to 7.  Less pain over his foot as well.  I'll change his dressing early tomorrow am and then can likely discharge on oral antibiotics - most likely doxycycline; he is allergic to PCN and had a problem with bactrim in the past.

## 2014-06-07 NOTE — Progress Notes (Signed)
Orthopedic Tech Progress Note Patient Details:  [name] 1950/03/15 [id number]  Ortho Devices Type of Ortho Device: Postop shoe/boot Ortho Device/Splint Location: rle Ortho Device/Splint Interventions: Application   Lysandra Loughmiller 06/07/2014, 11:18 AM

## 2014-06-07 NOTE — Progress Notes (Signed)
[facility] PROGRESS NOTE  [name] GNO:[id number] DOB: [birth year] DOA: 06/03/2014 PCP: Irven Shelling, MD  Assessment/Plan: 1- Right foot wound with possible abscess S/P: Irrigation, excisional, and debridement of right foot wound and abscess with sharp excisional debridement of skin, soft tissue, and fascia only. -Continue with vancomycin and Aztreonam. Day 5.  -Blood culture no growth to date.  -Abscess culture: Anaerobic culture: growing gram positive cocci in cluster. Group B Strep (S, Agalactiae)  2-Past history of diabetes, currently not on meds, now with hyperglycemia:  Continue with SSI. Follow Hb-A1c at 7. Might need to be discharge on metformin.   3-Hypoproteinemia;  started Glucerna.  Code Status: Full Code.  Family Communication: Care discussed with patient Disposition Plan: home when culture results available.    Consultants:  Dr [name].   Procedures:  none  Antibiotics:  IV vancomycin 5-11  Aztreonam 5-11  HPI/Subjective: Sitting in chair, no pain, eating ok,    Objective: Filed Vitals:   06/06/14 2221  BP: 120/74  Pulse: 52  Temp: 98.2 F (36.8 C)  Resp: 16    Intake/Output Summary (Last 24 hours) at 06/07/14 1527 Last data filed at 06/07/14 1015  Gross per 24 hour  Intake    770 ml  Output   1975 ml  Net  -1205 ml   Filed Weights   06/03/14 1126 06/03/14 1731  Weight: 109.77 kg (242 lb) 112.492 kg (248 lb)    Exam:   General:  Alert in no distress.   Cardiovascular: S 1, S 2 RRR  Respiratory: CTA  Abdomen: BS present, soft, nt  Musculoskeletal: right foot with dressing.   Data Reviewed: Basic Metabolic Panel:  Recent Labs Lab 06/03/14 1324 06/04/14 0340 06/06/14 1553 06/07/14 0506  NA 139 137  --  138  K 4.5 3.6  --  3.5  CL 102 104  --  102  CO2 28 23  --  25  GLUCOSE 176* 188*  --  147*  BUN 24* 14  --  11  CREATININE 0.92 0.94 0.82 0.80  CALCIUM 9.1 8.6*  --  8.3*   Liver Function  Tests:  Recent Labs Lab 06/03/14 1324 06/04/14 0340  AST 15 15  ALT 12* 12*  ALKPHOS 64 58  BILITOT 1.3* 1.3*  PROT 7.3 7.0  ALBUMIN 3.6 3.0*   No results for input(s): LIPASE, AMYLASE in the last 168 hours. No results for input(s): AMMONIA in the last 168 hours. CBC:  Recent Labs Lab 06/03/14 1324 06/04/14 0340 06/05/14 0301 06/06/14 1104  WBC 12.6* 11.0* 9.2 7.8  NEUTROABS 10.9*  --   --   --   HGB 14.5 13.7 12.5* 14.5  HCT 42.1 39.8 36.4* 41.9  MCV 92.5 90.9 90.3 90.7  PLT 183 190 201 213   Cardiac Enzymes: No results for input(s): CKTOTAL, CKMB, CKMBINDEX, TROPONINI in the last 168 hours. BNP (last 3 results) No results for input(s): BNP in the last 8760 hours.  ProBNP (last 3 results) No results for input(s): PROBNP in the last 8760 hours.  CBG:  Recent Labs Lab 06/06/14 1143 06/06/14 1719 06/06/14 2219 06/07/14 0739 06/07/14 1151  GLUCAP 142* 145* 138* 154* 122*    Recent Results (from the past 240 hour(s))  Culture, blood (routine x 2)     Status: None (Preliminary result)   Collection Time: 06/03/14  1:25 PM  Result Value Ref Range Status   Specimen Description BLOOD RIGHT [facility]  Final   Special Requests BOTTLES DRAWN AEROBIC AND  ANAEROBIC 5CC BOTH  Final   Culture   Final           BLOOD CULTURE RECEIVED NO GROWTH TO DATE CULTURE WILL BE HELD FOR 5 DAYS BEFORE ISSUING A FINAL NEGATIVE REPORT Performed at [facility]    Report Status PENDING  Incomplete  Culture, blood (routine x 2)     Status: None (Preliminary result)   Collection Time: 06/03/14  2:00 PM  Result Value Ref Range Status   Specimen Description BLOOD LEFT ARM  Final   Special Requests BOTTLES DRAWN AEROBIC AND ANAEROBIC 5CC BOTH  Final   Culture   Final           BLOOD CULTURE RECEIVED NO GROWTH TO DATE CULTURE WILL BE HELD FOR 5 DAYS BEFORE ISSUING A FINAL NEGATIVE REPORT Performed at [facility]    Report Status PENDING  Incomplete  Surgical pcr screen      Status: Abnormal   Collection Time: 06/04/14  5:26 PM  Result Value Ref Range Status   MRSA, PCR NEGATIVE NEGATIVE Final   Staphylococcus aureus POSITIVE (A) NEGATIVE Final    Comment:        The Xpert SA Assay (FDA approved for NASAL specimens in patients over 49 years of age), is one component of a comprehensive surveillance program.  Test performance has been validated by [facility] for patients greater than or equal to 69 year old. It is not intended to diagnose infection nor to guide or monitor treatment.   AFB culture with smear     Status: None (Preliminary result)   Collection Time: 06/04/14  5:56 PM  Result Value Ref Range Status   Specimen Description ABSCESS RIGHT FOOT  Final   Special Requests PATIENT ON FOLLOWING VANC  Final   Acid Fast Smear   Final    NO ACID FAST BACILLI SEEN Performed at [facility]    Culture   Final    CULTURE WILL BE EXAMINED FOR 6 WEEKS BEFORE ISSUING A FINAL REPORT Performed at [facility]    Report Status PENDING  Incomplete  Anaerobic culture     Status: None (Preliminary result)   Collection Time: 06/04/14  5:56 PM  Result Value Ref Range Status   Specimen Description ABSCESS RIGHT FOOT  Final   Special Requests PATIENT ON FOLLOWING VANC  Final   Gram Stain   Final    MODERATE WBC PRESENT, PREDOMINANTLY PMN NO SQUAMOUS EPITHELIAL CELLS SEEN MODERATE GRAM POSITIVE COCCI IN CLUSTERS Performed at [facility]    Culture   Final    NO ANAEROBES ISOLATED; CULTURE IN PROGRESS FOR 5 DAYS Performed at [facility]    Report Status PENDING  Incomplete  Culture, routine-abscess     Status: None   Collection Time: 06/04/14  5:56 PM  Result Value Ref Range Status   Specimen Description ABSCESS RIGHT FOOT  Final   Special Requests PATIENT ON FOLLOWING VANC  Final   Gram Stain   Final    MODERATE WBC PRESENT, PREDOMINANTLY PMN NO SQUAMOUS EPITHELIAL CELLS SEEN MODERATE GRAM POSITIVE COCCI IN  PAIRS Performed at [facility]    Culture   Final    FEW GROUP B STREP(S.AGALACTIAE)ISOLATED Note: TESTING AGAINST S. AGALACTIAE NOT ROUTINELY PERFORMED DUE TO PREDICTABILITY OF AMP/PEN/VAN SUSCEPTIBILITY. Performed at [facility]    Report Status 06/07/2014 FINAL  Final     Studies: No results found.  Scheduled Meds: . aspirin EC  81 mg Oral Daily  . atorvastatin  10 mg Oral q1800  . aztreonam  2 g Intravenous Q8H  . feeding supplement (GLUCERNA SHAKE)  237 mL Oral BID BM  . insulin aspart  0-9 Units Subcutaneous TID WC  . levothyroxine  200 mcg Oral QAC breakfast  . pantoprazole  40 mg Oral Daily  . vancomycin  1,250 mg Intravenous Q12H   Continuous Infusions:    Principal Problem:   Diabetic foot infection Active Problems:   Hypothyroidism   Peripheral vascular disease   Abscess of foot    Time spent: 30 minutes.     Niel Hummer A  [facility] Pager [phone]. If 7PM-7AM, please contact night-coverage at [email or web address], password [facility] 06/07/2014, 3:27 PM  LOS: 4 days

## 2014-06-08 ENCOUNTER — Encounter (HOSPITAL_COMMUNITY): Payer: Self-pay | Admitting: Orthopaedic Surgery

## 2014-06-08 LAB — GLUCOSE, CAPILLARY
Glucose-Capillary: 112 mg/dL — ABNORMAL HIGH (ref 65–99)
Glucose-Capillary: 166 mg/dL — ABNORMAL HIGH (ref 65–99)

## 2014-06-08 MED ORDER — METFORMIN HCL 500 MG PO TABS: 500.0000 mg | ORAL_TABLET | Freq: Two times a day (BID) | ORAL | Status: DC

## 2014-06-08 MED ORDER — POTASSIUM CHLORIDE CRYS ER 20 MEQ PO TBCR
40.0000 meq | EXTENDED_RELEASE_TABLET | Freq: Once | ORAL | Status: AC
  Administered 2014-06-08: 40 meq via ORAL
  Filled 2014-06-08: qty 2

## 2014-06-08 MED ORDER — CEPHALEXIN 500 MG PO CAPS: 500.0000 mg | ORAL_CAPSULE | Freq: Four times a day (QID) | ORAL | Status: DC

## 2014-06-08 MED ORDER — OXYCODONE HCL 5 MG PO TABS: 5.0000 mg | ORAL_TABLET | Freq: Four times a day (QID) | ORAL | Status: DC | PRN

## 2014-06-08 NOTE — Care Management (Signed)
UR updated. Magdalen Spatz RN BSN

## 2014-06-08 NOTE — Discharge Summary (Signed)
Physician Discharge Summary  [name] BFX:[id number] DOB: 05-Aug-1950 DOA: 06/03/2014  PCP: Irven Shelling, MD  Admit date: 06/03/2014 Discharge date: 06/08/2014  Time spent: 35 minutes  Recommendations for Outpatient Follow-up:  Follow up with DR [name] for left foot infection Follow up with PCP for further adjustment of diabetes.  Follow final anaerobic culture results.   Discharge Diagnoses:    Diabetic foot infection   Hypothyroidism   Peripheral vascular disease   Abscess of foot   Diabetes/   Discharge Condition: Stable.   Diet recommendation: Carb modified diet  Filed Weights   06/03/14 1126 06/03/14 1731  Weight: 109.77 kg (242 lb) 112.492 kg (248 lb)    History of present illness:  [name] is a 64 y.o. male with a past medical history of coronary artery disease with MI in 2006, requiring stent placement, previous history of diabetes, hypertension and sleep apnea, all of which apparently resolved after he underwent gastric bypass surgery in 2012, with which he lost about 125 pounds. Patient presented to the emergency department today due to worsening pain and swelling in his right foot. He tells me that he has a history of Charcot foot. Over the last few days it has been painful to walk and then he may have seen a blister which started oozing yellowish fluid. He had some fever and chills but did not check his temperature, just felt warm. He denies any history of nausea, vomiting, abdominal pain. The pain in his right foot is mainly with ambulation. While lying in the bed he does not have any pain in the right foot. He does have a history of neuropathy. He's never had similar wounds in the past. He also gets care at the [facility] of wound center. He call them on Monday and he was told to start taking Bactrim. He has taken 3 doses of the same. When he called in today they asked him to go to the nearest emergency department. Subsequently he was transferred here to the  hospital for further management by orthopedics.  Hospital Course:  1- Right foot wound with possible abscess S/P: Irrigation, excisional, and debridement of right foot wound and abscess with sharp excisional debridement of skin, soft tissue, and fascia only. -Continue with vancomycin and Aztreonam. Day 5.  -Blood culture no growth to date.  -Abscess culture:  Group B Strep (S, Agalactiae).  -Anaerobic culture; no growth, results pending.  Patient will be discharge on Keflex, he has allergy to penicillin, but he has try Keflex in the past.   2-Past history of diabetes, currently not on meds, now with hyperglycemia:  Continue with SSI. Follow Hb-A1c at 7. Might need to be discharge on metformin.   3-Hypoproteinemia; started Glucerna.  Procedures:  I and D.   Consultations:  Dr [name].   Discharge Exam: Filed Vitals:   06/08/14 0546  BP: 137/58  Pulse: 49  Temp: 98.2 F (36.8 C)  Resp: 18    General: Alert in no distress.  Cardiovascular: S 1, S 2 RRR Respiratory: CTA Left foot with clean dressing.   Discharge Instructions   Discharge Instructions    Diet Carb Modified    Complete by:  As directed      Increase activity slowly    Complete by:  As directed           Current Discharge Medication List    START taking these medications   Details  cephALEXin (KEFLEX) 500 MG capsule Take 1 capsule (500 mg total)  by mouth 4 (four) times daily. Qty: 56 capsule, Refills: 0    metFORMIN (GLUCOPHAGE) 500 MG tablet Take 1 tablet (500 mg total) by mouth 2 (two) times daily with a meal. Tale 1 tablet twice a day for 4 days then 2 tablets twice a day. Qty: 60 tablet, Refills: 0    oxyCODONE (OXY IR/ROXICODONE) 5 MG immediate release tablet Take 1 tablet (5 mg total) by mouth every 6 (six) hours as needed for severe pain. Qty: 30 tablet, Refills: 0      CONTINUE these medications which have NOT CHANGED   Details  aspirin 81 MG tablet Take 81 mg by mouth daily.       atorvastatin (LIPITOR) 10 MG tablet Take 10 mg by mouth daily at 6 PM.     Calcium Carbonate (CALCIUM 500 PO) Take 3 tablets by mouth daily.    Calcium Citrate 200 MG TABS Take 500 mg by mouth 3 (three) times daily. 500 mg tid    Cyanocobalamin (B-12) 500 MCG SUBL Place 1,000 mcg under the tongue daily.     fluticasone (FLONASE) 50 MCG/ACT nasal spray Place 2 sprays into both nostrils 2 (two) times daily.    Iron Combinations (IRON COMPLEX PO) Take 65 mg by mouth daily.     levothyroxine (SYNTHROID, LEVOTHROID) 200 MCG tablet Take 200 mcg by mouth daily.     Multiple Vitamins-Minerals (MULTIVITAMIN PO) Take 2 tablets by mouth daily. Chewable tablet    Multiple Vitamins-Minerals (OSTEO COMPLEX PO) SHAKLEE OSTEO MATRIX    nitroGLYCERIN (NITROSTAT) 0.4 MG SL tablet Place 0.4 mg under the tongue every 5 (five) minutes x 3 doses as needed for chest pain.     OMEGA 3 1000 MG CAPS Take 1 capsule by mouth daily.     Omeprazole 20 MG TBEC Take 20 mg by mouth every other day.    Polyethylene Glycol 3350 (MIRALAX PO) Take 1 packet by mouth daily as needed (constipation).     Probiotic Product (PROBIOTIC FORMULA PO) Take 1 tablet by mouth daily.     SILDENAFIL CITRATE PO Take 1 tablet by mouth daily as needed (erectile dysfuntion).     sodium chloride (OCEAN) 0.65 % SOLN nasal spray Place 1 spray into both nostrils as needed for congestion.    traMADol (ULTRAM) 50 MG tablet Take 50 mg by mouth every 6 (six) hours as needed. For pain    Wheat Dextrin (BENEFIBER PO) Take 1 each by mouth daily as needed (for constipation).     zaleplon (SONATA) 10 MG capsule Take 10 mg by mouth at bedtime.    cyclobenzaprine (FLEXERIL) 10 MG tablet Take 10 mg by mouth 3 (three) times daily as needed for muscle spasms.  Refills: 0      STOP taking these medications     azithromycin (ZITHROMAX) 250 MG tablet      sulfamethoxazole-trimethoprim (BACTRIM,SEPTRA) 400-80 MG per tablet        Allergies   Allergen Reactions  . Quinolones Itching  . Bactrim [Sulfamethoxazole-Trimethoprim] Itching and Rash  . Moxifloxacin Rash  . Penicillin G Rash  . Penicillins Rash   Follow-up Information    Follow up with [name], MD. Schedule an appointment as soon as possible for a visit in 1 week.   Specialty:  Orthopedic Surgery   Contact information:   [place] [address] [phone]       Follow up with Irven Shelling, MD In 1 week.   Specialty:  Internal Medicine  Contact information:   [address] [place] [address] [phone]        The results of significant diagnostics from this hospitalization (including imaging, microbiology, ancillary and laboratory) are listed below for reference.    Significant Diagnostic Studies: Dg Shoulder Left  05/26/2014   CLINICAL DATA:  LEFT shoulder pain after a fall several days ago.  EXAM: LEFT SHOULDER - 2+ VIEW  COMPARISON:  None.  FINDINGS: There is no evidence of fracture or dislocation. There is no evidence of arthropathy or other focal bone abnormality. Soft tissues are unremarkable.  IMPRESSION: Negative.   Electronically Signed   By: Rolla Flatten M.D.   On: 05/26/2014 14:36   Dg Foot Complete Right  06/03/2014   CLINICAL DATA:  Plantar foot wound  EXAM: RIGHT FOOT COMPLETE - 3+ VIEW  COMPARISON:  02/11/2013  FINDINGS: Generalized soft tissue swelling is noted about the tarsal bones. Significant tarsal degenerative changes are noted but stable from the prior exam. These likely represent Charcot joints. Soft tissue prominence is noted in the plantar aspect of the foot consistent with the current history. Vascular calcifications are noted.  IMPRESSION: Soft tissue wound on the plantar aspect of the foot.  Chronic changes in the tarsal bones stable from the previous exam.   Electronically Signed   By: Inez Catalina M.D.   On: 06/03/2014 13:54    Microbiology: Recent Results (from the  past 240 hour(s))  Culture, blood (routine x 2)     Status: None (Preliminary result)   Collection Time: 06/03/14  1:25 PM  Result Value Ref Range Status   Specimen Description BLOOD RIGHT AC  Final   Special Requests BOTTLES DRAWN AEROBIC AND ANAEROBIC 5CC BOTH  Final   Culture   Final           BLOOD CULTURE RECEIVED NO GROWTH TO DATE CULTURE WILL BE HELD FOR 5 DAYS BEFORE ISSUING A FINAL NEGATIVE REPORT Performed at [facility]    Report Status PENDING  Incomplete  Culture, blood (routine x 2)     Status: None (Preliminary result)   Collection Time: 06/03/14  2:00 PM  Result Value Ref Range Status   Specimen Description BLOOD LEFT ARM  Final   Special Requests BOTTLES DRAWN AEROBIC AND ANAEROBIC 5CC BOTH  Final   Culture   Final           BLOOD CULTURE RECEIVED NO GROWTH TO DATE CULTURE WILL BE HELD FOR 5 DAYS BEFORE ISSUING A FINAL NEGATIVE REPORT Performed at [facility]    Report Status PENDING  Incomplete  Surgical pcr screen     Status: Abnormal   Collection Time: 06/04/14  5:26 PM  Result Value Ref Range Status   MRSA, PCR NEGATIVE NEGATIVE Final   Staphylococcus aureus POSITIVE (A) NEGATIVE Final    Comment:        The Xpert SA Assay (FDA approved for NASAL specimens in patients over 35 years of age), is one component of a comprehensive surveillance program.  Test performance has been validated by [facility] for patients greater than or equal to 74 year old. It is not intended to diagnose infection nor to guide or monitor treatment.   AFB culture with smear     Status: None (Preliminary result)   Collection Time: 06/04/14  5:56 PM  Result Value Ref Range Status   Specimen Description ABSCESS RIGHT FOOT  Final   Special Requests PATIENT ON FOLLOWING VANC  Final   Acid Fast Smear   Final    NO ACID FAST BACILLI SEEN Performed at [facility]    Culture   Final    CULTURE WILL BE EXAMINED FOR 6 WEEKS BEFORE ISSUING A FINAL  REPORT Performed at [facility]    Report Status PENDING  Incomplete  Anaerobic culture     Status: None (Preliminary result)   Collection Time: 06/04/14  5:56 PM  Result Value Ref Range Status   Specimen Description ABSCESS RIGHT FOOT  Final   Special Requests PATIENT ON FOLLOWING VANC  Final   Gram Stain   Final    MODERATE WBC PRESENT, PREDOMINANTLY PMN NO SQUAMOUS EPITHELIAL CELLS SEEN MODERATE GRAM POSITIVE COCCI IN CLUSTERS Performed at [facility]    Culture   Final    NO ANAEROBES ISOLATED; CULTURE IN PROGRESS FOR 5 DAYS Performed at [facility]    Report Status PENDING  Incomplete  Culture, routine-abscess     Status: None   Collection Time: 06/04/14  5:56 PM  Result Value Ref Range Status   Specimen Description ABSCESS RIGHT FOOT  Final   Special Requests PATIENT ON FOLLOWING VANC  Final   Gram Stain   Final    MODERATE WBC PRESENT, PREDOMINANTLY PMN NO SQUAMOUS EPITHELIAL CELLS SEEN MODERATE GRAM POSITIVE COCCI IN PAIRS Performed at [facility]    Culture   Final    FEW GROUP B STREP(S.AGALACTIAE)ISOLATED Note: TESTING AGAINST S. AGALACTIAE NOT ROUTINELY PERFORMED DUE TO PREDICTABILITY OF AMP/PEN/VAN SUSCEPTIBILITY. Performed at [facility]    Report Status 06/07/2014 FINAL  Final     Labs: Basic Metabolic Panel:  Recent Labs Lab 06/03/14 1324 06/04/14 0340 06/06/14 1553 06/07/14 0506  NA 139 137  --  138  K 4.5 3.6  --  3.5  CL 102 104  --  102  CO2 28 23  --  25  GLUCOSE 176* 188*  --  147*  BUN 24* 14  --  11  CREATININE 0.92 0.94 0.82 0.80  CALCIUM 9.1 8.6*  --  8.3*   Liver Function Tests:  Recent Labs Lab 06/03/14 1324 06/04/14 0340  AST 15 15  ALT 12* 12*  ALKPHOS 64 58  BILITOT 1.3* 1.3*  PROT 7.3 7.0  ALBUMIN 3.6 3.0*   No results for input(s): LIPASE, AMYLASE in the last 168 hours. No results for input(s): AMMONIA in the last 168 hours. CBC:  Recent Labs Lab 06/03/14 1324  06/04/14 0340 06/05/14 0301 06/06/14 1104  WBC 12.6* 11.0* 9.2 7.8  NEUTROABS 10.9*  --   --   --   HGB 14.5 13.7 12.5* 14.5  HCT 42.1 39.8 36.4* 41.9  MCV 92.5 90.9 90.3 90.7  PLT 183 190 201 213   Cardiac Enzymes: No results for input(s): CKTOTAL, CKMB, CKMBINDEX, TROPONINI in the last 168 hours. BNP: BNP (last 3 results) No results for input(s): BNP in the last 8760 hours.  ProBNP (last 3 results) No results for input(s): PROBNP in the last 8760 hours.  CBG:  Recent Labs Lab 06/07/14 0739 06/07/14 1151 06/07/14 1659 06/07/14 2219 06/08/14 0811  GLUCAP 154* 122* 112* 144* 166*       Signed:  Ripley Bogosian A  [facility] 06/08/2014, 9:03 AM

## 2014-06-08 NOTE — Progress Notes (Signed)
DC home wit wife. Verbally understood DC instructions. Dressing supplies sent home with pt.[name]

## 2014-06-08 NOTE — Progress Notes (Signed)
Patient ID: [name], male   DOB: 05/07/1950, 64 y.o.   MRN: [id number] Right foot wound much improved.  Can be discharged to home today on oral antibiotics for 2 weeks.  He knows to call my office to be seen next week and I gave him wound care instructions.

## 2014-06-08 NOTE — Discharge Instructions (Signed)
Only put weight on your right heel for now. Leave your current dressing in place and keep it clean and dry until this Wed. 5/18. Starting Wed 5/18, you can remove your dressings and start getting your foot wet daily in the shower. Place a small amount/thin layer of triple antibiotic ointment on your foot wound followed by dry dressing starting 5/18

## 2014-06-08 NOTE — Progress Notes (Signed)
PT Cancellation Note  Patient Details Name: [name] MRN: [id number] DOB: 1950/03/14   Cancelled Treatment:    Reason Eval/Treat Not Completed: Patient declined, no reason specified Pt packing things and getting ready to go home after finishing up antibiotics. Reports not having any PT needs, comfortable with ambulation and mobility. Will sign off for now.   [place] 06/08/2014, 11:08 AM  Wray Kearns, PT, DPT [phone]

## 2014-06-09 LAB — ANAEROBIC CULTURE

## 2014-06-09 LAB — CULTURE, BLOOD (ROUTINE X 2)
Culture: NO GROWTH
Culture: NO GROWTH

## 2014-07-13 DIAGNOSIS — E11610 Type 2 diabetes mellitus with diabetic neuropathic arthropathy: Secondary | ICD-10-CM | POA: Insufficient documentation

## 2014-07-17 LAB — AFB CULTURE WITH SMEAR (NOT AT ARMC): Acid Fast Smear: NONE SEEN

## 2014-08-04 ENCOUNTER — Encounter: Payer: Self-pay | Admitting: Cardiology

## 2014-08-04 ENCOUNTER — Ambulatory Visit (INDEPENDENT_AMBULATORY_CARE_PROVIDER_SITE_OTHER): Payer: BLUE CROSS/BLUE SHIELD | Admitting: Cardiology

## 2014-08-04 VITALS — BP 130/80 | HR 53 | Ht >= 80 in | Wt 244.8 lb

## 2014-08-04 DIAGNOSIS — I252 Old myocardial infarction: Secondary | ICD-10-CM

## 2014-08-04 DIAGNOSIS — I350 Nonrheumatic aortic (valve) stenosis: Secondary | ICD-10-CM | POA: Insufficient documentation

## 2014-08-04 DIAGNOSIS — I2510 Atherosclerotic heart disease of native coronary artery without angina pectoris: Secondary | ICD-10-CM

## 2014-08-04 DIAGNOSIS — I059 Rheumatic mitral valve disease, unspecified: Secondary | ICD-10-CM | POA: Diagnosis not present

## 2014-08-04 DIAGNOSIS — I359 Nonrheumatic aortic valve disorder, unspecified: Secondary | ICD-10-CM

## 2014-08-04 DIAGNOSIS — E782 Mixed hyperlipidemia: Secondary | ICD-10-CM

## 2014-08-04 DIAGNOSIS — I452 Bifascicular block: Secondary | ICD-10-CM

## 2014-08-04 NOTE — Progress Notes (Signed)
[facility] [address], [place]  [address] Phone: [phone] Fax:  [phone]  Date:  08/04/2014   ID:  [name], DOB 02/24/1950, MRN [id number]  PCP:  Irven Shelling, MD   History of Present Illness: [name] is a 64 y.o. male with coronary artery disease former patient of Dr. [name] with prior stent to ramus in 2006, proximal LAD x 2. His distal RCA was diffusely diseased. There was a fixed inferior defect in 2012 on nuclear stress test. Also has diabetes hypertension hyperlipidemia and sleep apnea. On October 10, 2010 he underwent gastric bypass surgery by Dr. [name] Done. Excellent weight loss of approximately 130 pounds. He is off of his diabetic medications, he has decreased his Lipitor. He is not on any blood pressure medications.  He continues to have first degree A-V block, bifascicular block, sinus bradycardia. This has been present for several years. No high-risk symptoms such as syncope, significant shortness of breath or angina with exercise. He is to continue to monitor this and that it may result in a pacemaker in the future. During surgery, his heart rate was in the 30s at times. Anesthesiologist noted this. I observed another EKG showing heart rate of 48 with bifascicular block. Once again no high-risk symptoms. We have been watching this.  He states that he has had a heart murmur since childhood, mild mitral regurgitation on last echocardiogram in 2011. He was walking half miles almost daily up into his right foot wound. Doing well, no change in symptoms.  Excellent lipids    Wt Readings from Last 3 Encounters:  08/04/14 244 lb 12 oz (111.018 kg)  06/03/14 248 lb (112.492 kg)  07/29/13 249 lb (112.946 kg)     Past Medical History  Diagnosis Date  . Hyperlipidemia   . Heart attack 06/2004  . Coronary artery disease   . Hypothyroidism   . GERD (gastroesophageal reflux disease)   . Diabetes mellitus     diet-controlled  . Sleep apnea    sleep study 03/29/2011; no CPAP use  . Mucoid cyst of joint 09/2011    left thumb  . Hx MRSA infection 02/2006  . Dental crowns present   . Hypertension     hx. of - has not been on med. since losing wt. after gastric bypass  . Bifascicular block   . First degree AV block   . Heart murmur     aortic    Past Surgical History  Procedure Laterality Date  . Roux-en-y gastric bypass  10/10/2010    laparoscopic  . Toe amputation  02/23/2006    left foot second ray amputation  . Coronary angioplasty with stent placement  07/12/2004; 08/03/2004    total of 3 stents  . Cataract extraction  02/2010; 03/2010  . Mass excision  10/04/2011    Procedure: EXCISION MASS;  Surgeon: Wynonia Sours, MD;  Location: [facility];  Service: Orthopedics;  Laterality: Left;  excision cyst debridment ip joint of left thumb  . I&d extremity Right 06/04/2014    Procedure: IRRIGATION AND DEBRIDEMENT EXTREMITY;  Surgeon: [name], MD;  Location: [facility];  Service: Orthopedics;  Laterality: Right;    Current Outpatient Prescriptions  Medication Sig Dispense Refill  . aspirin 81 MG tablet Take 81 mg by mouth daily.      Marland Kitchen atorvastatin (LIPITOR) 10 MG tablet Take 10 mg by mouth daily at 6 PM.     . Calcium Carbonate (  CALCIUM 500 PO) Take 500 mg by mouth daily.     . Cyanocobalamin (B-12) 500 MCG SUBL Place 1,000 mcg under the tongue daily. Take Monday Wednesday Friday    . fluticasone (FLONASE) 50 MCG/ACT nasal spray Place 2 sprays into both nostrils 2 (two) times daily.    . Iron Combinations (IRON COMPLEX PO) Take 65 mg by mouth daily.     Marland Kitchen levothyroxine (SYNTHROID, LEVOTHROID) 200 MCG tablet Take 200 mcg by mouth daily.     . metFORMIN (GLUCOPHAGE) 500 MG tablet Take 1 tablet (500 mg total) by mouth 2 (two) times daily with a meal. Tale 1 tablet twice a day for 4 days then 2 tablets twice a day. (Patient taking differently: Take 500 mg by mouth 2 (two) times daily with a meal. ) 60 tablet 0  .  Multiple Vitamins-Minerals (MULTIVITAMIN PO) Take 2 tablets by mouth daily. Chewable tablet    . Multiple Vitamins-Minerals (OSTEO COMPLEX PO) SHAKLEE OSTEO MATRIX    . nitroGLYCERIN (NITROSTAT) 0.4 MG SL tablet Place 0.4 mg under the tongue every 5 (five) minutes x 3 doses as needed for chest pain.     Marland Kitchen OMEGA 3 1000 MG CAPS Take 1 capsule by mouth daily.     . Omeprazole 20 MG TBEC Take 20 mg by mouth every other day.    Glory Rosebush DELICA LANCETS FINE MISC USE TO TEST DAILY  3  . ONETOUCH VERIO test strip USE TO TEST DAILY  3  . Polyethylene Glycol 3350 (MIRALAX PO) Take 1 packet by mouth daily as needed (constipation).     . polyethylene glycol-electrolytes (NULYTELY/GOLYTELY) 420 G solution TAKE AS DIRECTED SEE SHEET FOR INSTRUCTIONS  0  . Probiotic Product (PROBIOTIC FORMULA PO) Take 1 tablet by mouth daily.     Marland Kitchen SILDENAFIL CITRATE PO Take 1 tablet by mouth daily as needed (erectile dysfuntion).     . sodium chloride (OCEAN) 0.65 % SOLN nasal spray Place 1 spray into both nostrils as needed for congestion.    . Wheat Dextrin (BENEFIBER PO) Take 1 each by mouth daily as needed (for constipation).     . zaleplon (SONATA) 10 MG capsule Take 10 mg by mouth at bedtime as needed.      No current facility-administered medications for this visit.    Allergies:    Allergies  Allergen Reactions  . Quinolones Itching  . Bactrim [Sulfamethoxazole-Trimethoprim] Itching and Rash  . Moxifloxacin Rash  . Penicillin G Rash  . Penicillins Rash    Social History:  The patient  reports that he has never smoked. He has never used smokeless tobacco. He reports that he does not drink alcohol or use illicit drugs.   Family History  Problem Relation Age of Onset  . Heart disease Mother     ROS:  Please see the history of present illness.   Denies any fevers, chills, orthopnea, PND   All other systems reviewed and negative.   PHYSICAL EXAM: VS:  BP 130/80 mmHg  Pulse 53  Ht 6\' 8"  (2.032 m)  Wt  244 lb 12 oz (111.018 kg)  BMI 26.89 kg/m2 Well nourished, well developed, in no acute distress HEENT: normal, /AT, EOMI Neck: no JVD, normal carotid upstroke, radiation of aortic murmur possibly Cardiac:  normal S1, S2; RRR; 2/6 holosystolic apical murmur as well as what appears to be a systolic ejection murmur right upper sternal border with radiation to the carotids Lungs:  clear to auscultation bilaterally, no wheezing, rhonchi  or rales Abd: soft, nontender, no hepatomegaly, no bruits Ext: no edema, 2+ distal pulses, leg boot Skin: warm and dry GU: deferred Neuro: no focal abnormalities noted, AAO x 3  EKG:  Today 08/04/2014 sinus bradycardia with first-degree AV block, right bundle block, left anterior fascicular block, PR interval 252 ms personally viewed, no significant change from prior 07/29/13-sinus rhythm, first degree AV block 242 ms, right bundle branch block, left anterior fascicular block, bifascicular block   Nuclear stress test-2012-low risk, fixed inferior wall defect. No ischemia.  Echocardiogram 2011-normal EF, mild MR, aortic calcification  Labs-3/15-LDL 63  ASSESSMENT AND PLAN:  1. Coronary artery disease-previous stents (3 total) to ramus, LAD proximal, diffusely diseased RCA. Continuing with current aggressive medical management. Aspirin. No anginal symptoms. Currently not on beta blocker because of this and bifascicular block. Avoid AV nodal blocking agents. Last nuclear stress test as above, low risk with no ischemia. 2. Old myocardial infarction-inferior. 3. Hyperlipidemia-currently on low-dose Lipitor because of significantly low LDL. LDL 63. 4. Bifascicular block-no high-risk symptoms such as syncope. Avoiding AV nodal blocking agents. 5. Heart murmur-states that he is had a murmur since childhood-prior mitral regurgitation-mild, aortic valve calcification. I do hear radiation of murmur to carotids. Perhaps aortic stenosis is now present. I will check  echocardiogram since it is been 5 years. 6. Right foot wound-Charcot joint-[facility] wound center, hyperbaric 7. Annual followup  Signed, Candee Furbish, MD [facility]  08/04/2014 8:15 AM

## 2014-08-04 NOTE — Patient Instructions (Signed)
Medication Instructions:  Your physician recommends that you continue on your current medications as directed. Please refer to the Current Medication list given to you today.  Testing/Procedures: Your physician has requested that you have an echocardiogram. Echocardiography is a painless test that uses sound waves to create images of your heart. It provides your doctor with information about the size and shape of your heart and how well your heart's chambers and valves are working. This procedure takes approximately one hour. There are no restrictions for this procedure.  Follow-Up: Follow up in 1 year with Dr. Marlou Porch.  You will receive a letter in the mail 2 months before you are due.  Please call us when you receive this letter to schedule your follow up appointment.  Thank you for choosing [facility]!!

## 2014-09-09 ENCOUNTER — Encounter (HOSPITAL_COMMUNITY): Payer: Self-pay | Admitting: *Deleted

## 2014-09-14 ENCOUNTER — Ambulatory Visit (HOSPITAL_COMMUNITY)
Admission: RE | Admit: 2014-09-14 | Discharge: 2014-09-14 | Disposition: A | Discharge status: Home / Self Care | Payer: BLUE CROSS/BLUE SHIELD | Source: Ambulatory Visit | Attending: Gastroenterology | Admitting: Gastroenterology

## 2014-09-14 ENCOUNTER — Encounter (HOSPITAL_COMMUNITY): Payer: Self-pay

## 2014-09-14 ENCOUNTER — Encounter (HOSPITAL_COMMUNITY)
Admission: RE | Disposition: A | Discharge status: Home / Self Care | Payer: Self-pay | Source: Ambulatory Visit | Attending: Gastroenterology

## 2014-09-14 ENCOUNTER — Ambulatory Visit (HOSPITAL_COMMUNITY): Payer: BLUE CROSS/BLUE SHIELD | Admitting: Anesthesiology

## 2014-09-14 DIAGNOSIS — M1990 Unspecified osteoarthritis, unspecified site: Secondary | ICD-10-CM | POA: Diagnosis not present

## 2014-09-14 DIAGNOSIS — E039 Hypothyroidism, unspecified: Secondary | ICD-10-CM | POA: Insufficient documentation

## 2014-09-14 DIAGNOSIS — I2510 Atherosclerotic heart disease of native coronary artery without angina pectoris: Secondary | ICD-10-CM | POA: Diagnosis not present

## 2014-09-14 DIAGNOSIS — Z955 Presence of coronary angioplasty implant and graft: Secondary | ICD-10-CM | POA: Insufficient documentation

## 2014-09-14 DIAGNOSIS — Z1211 Encounter for screening for malignant neoplasm of colon: Secondary | ICD-10-CM | POA: Insufficient documentation

## 2014-09-14 DIAGNOSIS — E1140 Type 2 diabetes mellitus with diabetic neuropathy, unspecified: Secondary | ICD-10-CM | POA: Diagnosis not present

## 2014-09-14 DIAGNOSIS — G4733 Obstructive sleep apnea (adult) (pediatric): Secondary | ICD-10-CM | POA: Diagnosis not present

## 2014-09-14 DIAGNOSIS — I10 Essential (primary) hypertension: Secondary | ICD-10-CM | POA: Diagnosis not present

## 2014-09-14 DIAGNOSIS — I252 Old myocardial infarction: Secondary | ICD-10-CM | POA: Diagnosis not present

## 2014-09-14 DIAGNOSIS — Z89422 Acquired absence of other left toe(s): Secondary | ICD-10-CM | POA: Diagnosis not present

## 2014-09-14 DIAGNOSIS — Z9884 Bariatric surgery status: Secondary | ICD-10-CM | POA: Insufficient documentation

## 2014-09-14 HISTORY — PX: COLONOSCOPY WITH PROPOFOL: SHX5780

## 2014-09-14 LAB — GLUCOSE, CAPILLARY: Glucose-Capillary: 125 mg/dL — ABNORMAL HIGH (ref 65–99)

## 2014-09-14 SURGERY — COLONOSCOPY WITH PROPOFOL
Anesthesia: Monitor Anesthesia Care

## 2014-09-14 MED ORDER — ATROPINE SULFATE 0.4 MG/ML IJ SOLN
INTRAMUSCULAR | Status: AC
  Filled 2014-09-14: qty 1

## 2014-09-14 MED ORDER — GLYCOPYRROLATE 0.2 MG/ML IJ SOLN
INTRAMUSCULAR | Status: DC | PRN
  Administered 2014-09-14 (×2): 0.1 mg via INTRAVENOUS

## 2014-09-14 MED ORDER — EPHEDRINE SULFATE 50 MG/ML IJ SOLN
INTRAMUSCULAR | Status: AC
  Filled 2014-09-14: qty 1

## 2014-09-14 MED ORDER — SODIUM CHLORIDE 0.9 % IV SOLN: INTRAVENOUS | Status: DC

## 2014-09-14 MED ORDER — PROPOFOL 10 MG/ML IV BOLUS
INTRAVENOUS | Status: DC | PRN
  Administered 2014-09-14 (×4): 20 mg via INTRAVENOUS
  Administered 2014-09-14: 10 mg via INTRAVENOUS
  Administered 2014-09-14 (×3): 20 mg via INTRAVENOUS
  Administered 2014-09-14 (×2): 50 mg via INTRAVENOUS
  Administered 2014-09-14 (×3): 20 mg via INTRAVENOUS

## 2014-09-14 MED ORDER — LACTATED RINGERS IV SOLN: INTRAVENOUS | Status: DC

## 2014-09-14 MED ORDER — PROPOFOL 10 MG/ML IV BOLUS
INTRAVENOUS | Status: AC
  Filled 2014-09-14: qty 20

## 2014-09-14 MED ORDER — ATROPINE SULFATE 0.4 MG/ML IJ SOLN
INTRAMUSCULAR | Status: DC | PRN
  Administered 2014-09-14 (×2): 0.2 mg via INTRAVENOUS

## 2014-09-14 MED ORDER — LACTATED RINGERS IV SOLN
INTRAVENOUS | Status: DC
  Administered 2014-09-14: 1000 mL via INTRAVENOUS

## 2014-09-14 MED ORDER — GLYCOPYRROLATE 0.2 MG/ML IJ SOLN
INTRAMUSCULAR | Status: AC
  Filled 2014-09-14: qty 1

## 2014-09-14 SURGICAL SUPPLY — 21 items

## 2014-09-14 NOTE — Transfer of Care (Signed)
Immediate Anesthesia Transfer of Care Note  Patient: [name]  Procedure(s) Performed: Procedure(s): COLONOSCOPY WITH PROPOFOL (N/A)  Patient Location: PACU  Anesthesia Type:MAC  Level of Consciousness: awake, alert  and oriented  Airway & Oxygen Therapy: Patient Spontanous Breathing and Patient connected to nasal cannula oxygen  Post-op Assessment: Report given to RN and Post -op Vital signs reviewed and stable  Post vital signs: Reviewed and stable  Last Vitals:  Filed Vitals:   09/14/14 0744  BP: 139/79  Pulse: 49  Temp: 36.4 C  Resp: 20    Complications: No apparent anesthesia complications

## 2014-09-14 NOTE — Anesthesia Postprocedure Evaluation (Signed)
  Anesthesia Post-op Note  Patient: [name]  Procedure(s) Performed: Procedure(s) (LRB): COLONOSCOPY WITH PROPOFOL (N/A)  Patient Location: PACU  Anesthesia Type: MAC  Level of Consciousness: awake and alert   Airway and Oxygen Therapy: Patient Spontanous Breathing  Post-op Pain: mild  Post-op Assessment: Post-op Vital signs reviewed, Patient's Cardiovascular Status Stable, Respiratory Function Stable, Patent Airway and No signs of Nausea or vomiting  Last Vitals:  Filed Vitals:   09/14/14 1000  BP: 133/69  Pulse: 47  Temp:   Resp: 20    Post-op Vital Signs: stable   Complications: No apparent anesthesia complications

## 2014-09-14 NOTE — H&P (Signed)
  Procedure: Screening colonoscopy. 03/21/2004 normal screening colonoscopy performed. 03/21/2004 normal esophagogastroduodenoscopy with small bowel biopsies performed. Obstructive sleep apnea syndrome. No family history of colon cancer.  History: The patient is a 64 year old male born Sep 14, 1950. He is scheduled to undergo a repeat screening colonoscopy today.  Past medical history: Coronary artery disease. Coronary artery stent placement. Left ventricular ejection fraction 65%. Type 2 diabetes mellitus complicated by peripheral neuropathy. Hypertension. Bariatric surgery. Hypothyroidism post radioactive iodine ablation for hyperthyroidism. Obstructive sleep apnea syndrome. Osteoarthritis. Panniculectomy. Gastric bypass surgery. Bilateral cataract surgery. Left second toe amputation.  Right knee arthroscopy.  Medication allergies: Penicillin. Avelox.  Exam: The patient is alert and lying on the endoscopy stretcher. Abdomen is soft and nontender to palpation. Lungs are clear to auscultation. Cardiac exam reveals a regular rhythm.  Plan: Proceed with screening colonoscopy

## 2014-09-14 NOTE — Op Note (Signed)
Procedure: Screening colonoscopy. Normal screening colonoscopy performed on 03/21/2004  Endoscopist: Earle Gell  Premedication: Propofol administered by anesthesia  Procedure: The patient was placed in the left lateral decubitus position. Anal inspection and digital rectal exam were normal. The Pentax pediatric colonoscope was introduced into the rectum and advanced to the cecum. A normal-appearing ileocecal valve and appendiceal orifice were identified. Colonic preparation of the right colon was fair; preparation of the remainder of the colon was satisfactory. Withdrawal time was 15 minutes  Rectum. Normal. Retroflexed view of the distal rectum was normal  Sigmoid colon and descending colon. Normal  Splenic flexure. Normal  Transverse colon. Normal  Hepatic flexure. Normal  Ascending colon. Normal  Cecum and ileocecal valve. Normal  Assessment: Normal screening colonoscopy. Colonic preparation of the right colon was only fair compromising screen for colon polyps.  Recommendation: Schedule repeat screening colonoscopy in 1 year using the split dose colonic lavage prep

## 2014-09-14 NOTE — Discharge Instructions (Signed)
Colonoscopy, Care After °These instructions give you information on caring for yourself after your procedure. Your doctor may also give you more specific instructions. Call your doctor if you have any problems or questions after your procedure. °HOME CARE °· Do not drive for 24 hours. °· Do not sign important papers or use machinery for 24 hours. °· You may shower. °· You may go back to your usual activities, but go slower for the first 24 hours. °· Take rest breaks often during the first 24 hours. °· Walk around or use warm packs on your belly (abdomen) if you have belly cramping or gas. °· Drink enough fluids to keep your pee (urine) clear or pale yellow. °· Resume your normal diet. Avoid heavy or fried foods. °· Avoid drinking alcohol for 24 hours or as told by your doctor. °· Only take medicines as told by your doctor. °If a tissue sample (biopsy) was taken during the procedure:  °· Do not take aspirin or blood thinners for 7 days, or as told by your doctor. °· Do not drink alcohol for 7 days, or as told by your doctor. °· Eat soft foods for the first 24 hours. °GET HELP IF: °You still have a small amount of blood in your poop (stool) 2-3 days after the procedure. °GET HELP RIGHT AWAY IF: °· You have more than a small amount of blood in your poop. °· You see clumps of tissue (blood clots) in your poop. °· Your belly is puffy (swollen). °· You feel sick to your stomach (nauseous) or throw up (vomit). °· You have a fever. °· You have belly pain that gets worse and medicine does not help. °MAKE SURE YOU: °· Understand these instructions. °· Will watch your condition. °· Will get help right away if you are not doing well or get worse. °Document Released: 02/11/2010 Document Revised: 01/14/2013 Document Reviewed: 09/16/2012 °Exit[employer]® Patient Information ©2015 [employer]. This information is not intended to replace advice given to you by your health care provider. Make sure you discuss any questions you have with  your health care provider. ° °

## 2014-09-14 NOTE — Anesthesia Preprocedure Evaluation (Addendum)
Anesthesia Evaluation  Patient identified by MRN, date of birth, ID band Patient awake    Reviewed: Allergy & Precautions, NPO status , Patient's Chart, lab work & pertinent test results  Airway Mallampati: II  TM Distance: >3 FB Neck ROM: Full    Dental no notable dental hx.    Pulmonary sleep apnea ,  breath sounds clear to auscultation  Pulmonary exam normal       Cardiovascular hypertension, Pt. on medications - angina+ CAD, + Past MI (2006) and + Cardiac Stents (2006) Normal cardiovascular examRhythm:Regular Rate:Normal     Neuro/Psych negative neurological ROS  negative psych ROS   GI/Hepatic negative GI ROS, Neg liver ROS,   Endo/Other  diabetes, Type 2, Oral Hypoglycemic Agents  Renal/GU negative Renal ROS  negative genitourinary   Musculoskeletal negative musculoskeletal ROS (+)   Abdominal   Peds negative pediatric ROS (+)  Hematology negative hematology ROS (+)   Anesthesia Other Findings   Reproductive/Obstetrics negative OB ROS                            Anesthesia Physical Anesthesia Plan  ASA: III  Anesthesia Plan: MAC   Post-op Pain Management:    Induction:   Airway Management Planned: Simple Face Mask  Additional Equipment:   Intra-op Plan:   Post-operative Plan:   Informed Consent: I have reviewed the patients History and Physical, chart, labs and discussed the procedure including the risks, benefits and alternatives for the proposed anesthesia with the patient or authorized representative who has indicated his/her understanding and acceptance.   Dental advisory given  Plan Discussed with: CRNA  Anesthesia Plan Comments:         Anesthesia Quick Evaluation

## 2014-09-15 ENCOUNTER — Encounter (HOSPITAL_COMMUNITY): Payer: Self-pay | Admitting: Gastroenterology

## 2014-09-24 ENCOUNTER — Other Ambulatory Visit: Payer: Self-pay

## 2014-09-24 ENCOUNTER — Ambulatory Visit (HOSPITAL_COMMUNITY): Payer: BLUE CROSS/BLUE SHIELD | Attending: Cardiology

## 2014-09-24 DIAGNOSIS — I351 Nonrheumatic aortic (valve) insufficiency: Secondary | ICD-10-CM | POA: Insufficient documentation

## 2014-09-24 DIAGNOSIS — I059 Rheumatic mitral valve disease, unspecified: Secondary | ICD-10-CM | POA: Diagnosis not present

## 2014-09-24 DIAGNOSIS — I071 Rheumatic tricuspid insufficiency: Secondary | ICD-10-CM | POA: Insufficient documentation

## 2014-09-24 DIAGNOSIS — I340 Nonrheumatic mitral (valve) insufficiency: Secondary | ICD-10-CM | POA: Diagnosis not present

## 2014-09-24 DIAGNOSIS — I2510 Atherosclerotic heart disease of native coronary artery without angina pectoris: Secondary | ICD-10-CM | POA: Diagnosis not present

## 2014-09-24 DIAGNOSIS — E119 Type 2 diabetes mellitus without complications: Secondary | ICD-10-CM | POA: Diagnosis not present

## 2014-09-24 DIAGNOSIS — I359 Nonrheumatic aortic valve disorder, unspecified: Secondary | ICD-10-CM | POA: Diagnosis not present

## 2014-09-24 DIAGNOSIS — E785 Hyperlipidemia, unspecified: Secondary | ICD-10-CM | POA: Insufficient documentation

## 2014-09-24 DIAGNOSIS — I10 Essential (primary) hypertension: Secondary | ICD-10-CM | POA: Diagnosis not present

## 2014-09-24 DIAGNOSIS — I358 Other nonrheumatic aortic valve disorders: Secondary | ICD-10-CM | POA: Diagnosis present

## 2014-10-22 DIAGNOSIS — N401 Enlarged prostate with lower urinary tract symptoms: Secondary | ICD-10-CM | POA: Insufficient documentation

## 2014-10-30 DIAGNOSIS — L97509 Non-pressure chronic ulcer of other part of unspecified foot with unspecified severity: Secondary | ICD-10-CM | POA: Insufficient documentation

## 2014-11-26 ENCOUNTER — Other Ambulatory Visit: Payer: Self-pay

## 2014-11-26 MED ORDER — NITROGLYCERIN 0.4 MG SL SUBL: 0.4000 mg | SUBLINGUAL_TABLET | SUBLINGUAL | Status: DC | PRN

## 2015-01-12 DIAGNOSIS — Z9889 Other specified postprocedural states: Secondary | ICD-10-CM | POA: Insufficient documentation

## 2015-07-01 DIAGNOSIS — Z8546 Personal history of malignant neoplasm of prostate: Secondary | ICD-10-CM | POA: Diagnosis not present

## 2015-07-01 DIAGNOSIS — N400 Enlarged prostate without lower urinary tract symptoms: Secondary | ICD-10-CM | POA: Diagnosis not present

## 2015-07-01 DIAGNOSIS — I10 Essential (primary) hypertension: Secondary | ICD-10-CM | POA: Diagnosis not present

## 2015-07-01 DIAGNOSIS — Z7982 Long term (current) use of aspirin: Secondary | ICD-10-CM | POA: Diagnosis not present

## 2015-07-01 DIAGNOSIS — E785 Hyperlipidemia, unspecified: Secondary | ICD-10-CM | POA: Diagnosis not present

## 2015-07-01 DIAGNOSIS — Z9884 Bariatric surgery status: Secondary | ICD-10-CM | POA: Diagnosis not present

## 2015-07-01 DIAGNOSIS — L97511 Non-pressure chronic ulcer of other part of right foot limited to breakdown of skin: Secondary | ICD-10-CM | POA: Diagnosis not present

## 2015-07-01 DIAGNOSIS — E11610 Type 2 diabetes mellitus with diabetic neuropathic arthropathy: Secondary | ICD-10-CM | POA: Diagnosis not present

## 2015-07-01 DIAGNOSIS — Z48817 Encounter for surgical aftercare following surgery on the skin and subcutaneous tissue: Secondary | ICD-10-CM | POA: Diagnosis not present

## 2015-07-01 DIAGNOSIS — G4733 Obstructive sleep apnea (adult) (pediatric): Secondary | ICD-10-CM | POA: Diagnosis not present

## 2015-07-01 DIAGNOSIS — I2510 Atherosclerotic heart disease of native coronary artery without angina pectoris: Secondary | ICD-10-CM | POA: Diagnosis not present

## 2015-07-01 DIAGNOSIS — Z7984 Long term (current) use of oral hypoglycemic drugs: Secondary | ICD-10-CM | POA: Diagnosis not present

## 2015-07-02 DIAGNOSIS — I2510 Atherosclerotic heart disease of native coronary artery without angina pectoris: Secondary | ICD-10-CM | POA: Diagnosis not present

## 2015-07-02 DIAGNOSIS — L97511 Non-pressure chronic ulcer of other part of right foot limited to breakdown of skin: Secondary | ICD-10-CM | POA: Diagnosis not present

## 2015-07-02 DIAGNOSIS — Z48817 Encounter for surgical aftercare following surgery on the skin and subcutaneous tissue: Secondary | ICD-10-CM | POA: Diagnosis not present

## 2015-07-02 DIAGNOSIS — E11610 Type 2 diabetes mellitus with diabetic neuropathic arthropathy: Secondary | ICD-10-CM | POA: Diagnosis not present

## 2015-07-02 DIAGNOSIS — N400 Enlarged prostate without lower urinary tract symptoms: Secondary | ICD-10-CM | POA: Diagnosis not present

## 2015-07-02 DIAGNOSIS — I10 Essential (primary) hypertension: Secondary | ICD-10-CM | POA: Diagnosis not present

## 2015-07-05 DIAGNOSIS — I2510 Atherosclerotic heart disease of native coronary artery without angina pectoris: Secondary | ICD-10-CM | POA: Diagnosis not present

## 2015-07-05 DIAGNOSIS — E11610 Type 2 diabetes mellitus with diabetic neuropathic arthropathy: Secondary | ICD-10-CM | POA: Diagnosis not present

## 2015-07-05 DIAGNOSIS — N400 Enlarged prostate without lower urinary tract symptoms: Secondary | ICD-10-CM | POA: Diagnosis not present

## 2015-07-05 DIAGNOSIS — L97511 Non-pressure chronic ulcer of other part of right foot limited to breakdown of skin: Secondary | ICD-10-CM | POA: Diagnosis not present

## 2015-07-05 DIAGNOSIS — Z48817 Encounter for surgical aftercare following surgery on the skin and subcutaneous tissue: Secondary | ICD-10-CM | POA: Diagnosis not present

## 2015-07-05 DIAGNOSIS — I10 Essential (primary) hypertension: Secondary | ICD-10-CM | POA: Diagnosis not present

## 2015-07-08 DIAGNOSIS — Z48817 Encounter for surgical aftercare following surgery on the skin and subcutaneous tissue: Secondary | ICD-10-CM | POA: Diagnosis not present

## 2015-07-08 DIAGNOSIS — I2510 Atherosclerotic heart disease of native coronary artery without angina pectoris: Secondary | ICD-10-CM | POA: Diagnosis not present

## 2015-07-08 DIAGNOSIS — N400 Enlarged prostate without lower urinary tract symptoms: Secondary | ICD-10-CM | POA: Diagnosis not present

## 2015-07-08 DIAGNOSIS — E11610 Type 2 diabetes mellitus with diabetic neuropathic arthropathy: Secondary | ICD-10-CM | POA: Diagnosis not present

## 2015-07-08 DIAGNOSIS — L97511 Non-pressure chronic ulcer of other part of right foot limited to breakdown of skin: Secondary | ICD-10-CM | POA: Diagnosis not present

## 2015-07-08 DIAGNOSIS — I10 Essential (primary) hypertension: Secondary | ICD-10-CM | POA: Diagnosis not present

## 2015-07-12 DIAGNOSIS — N400 Enlarged prostate without lower urinary tract symptoms: Secondary | ICD-10-CM | POA: Diagnosis not present

## 2015-07-12 DIAGNOSIS — I10 Essential (primary) hypertension: Secondary | ICD-10-CM | POA: Diagnosis not present

## 2015-07-12 DIAGNOSIS — Z48817 Encounter for surgical aftercare following surgery on the skin and subcutaneous tissue: Secondary | ICD-10-CM | POA: Diagnosis not present

## 2015-07-12 DIAGNOSIS — L97511 Non-pressure chronic ulcer of other part of right foot limited to breakdown of skin: Secondary | ICD-10-CM | POA: Diagnosis not present

## 2015-07-12 DIAGNOSIS — E11610 Type 2 diabetes mellitus with diabetic neuropathic arthropathy: Secondary | ICD-10-CM | POA: Diagnosis not present

## 2015-07-12 DIAGNOSIS — I2510 Atherosclerotic heart disease of native coronary artery without angina pectoris: Secondary | ICD-10-CM | POA: Diagnosis not present

## 2015-07-13 DIAGNOSIS — L97509 Non-pressure chronic ulcer of other part of unspecified foot with unspecified severity: Secondary | ICD-10-CM | POA: Diagnosis not present

## 2015-07-13 DIAGNOSIS — E11621 Type 2 diabetes mellitus with foot ulcer: Secondary | ICD-10-CM | POA: Diagnosis not present

## 2015-07-13 DIAGNOSIS — L97519 Non-pressure chronic ulcer of other part of right foot with unspecified severity: Secondary | ICD-10-CM | POA: Diagnosis not present

## 2015-07-13 DIAGNOSIS — E1169 Type 2 diabetes mellitus with other specified complication: Secondary | ICD-10-CM | POA: Diagnosis not present

## 2015-07-13 DIAGNOSIS — M869 Osteomyelitis, unspecified: Secondary | ICD-10-CM | POA: Diagnosis not present

## 2015-07-15 DIAGNOSIS — N400 Enlarged prostate without lower urinary tract symptoms: Secondary | ICD-10-CM | POA: Diagnosis not present

## 2015-07-15 DIAGNOSIS — I2510 Atherosclerotic heart disease of native coronary artery without angina pectoris: Secondary | ICD-10-CM | POA: Diagnosis not present

## 2015-07-15 DIAGNOSIS — Z48817 Encounter for surgical aftercare following surgery on the skin and subcutaneous tissue: Secondary | ICD-10-CM | POA: Diagnosis not present

## 2015-07-15 DIAGNOSIS — L97511 Non-pressure chronic ulcer of other part of right foot limited to breakdown of skin: Secondary | ICD-10-CM | POA: Diagnosis not present

## 2015-07-15 DIAGNOSIS — I10 Essential (primary) hypertension: Secondary | ICD-10-CM | POA: Diagnosis not present

## 2015-07-15 DIAGNOSIS — E11610 Type 2 diabetes mellitus with diabetic neuropathic arthropathy: Secondary | ICD-10-CM | POA: Diagnosis not present

## 2015-07-19 DIAGNOSIS — E11610 Type 2 diabetes mellitus with diabetic neuropathic arthropathy: Secondary | ICD-10-CM | POA: Diagnosis not present

## 2015-07-19 DIAGNOSIS — L97511 Non-pressure chronic ulcer of other part of right foot limited to breakdown of skin: Secondary | ICD-10-CM | POA: Diagnosis not present

## 2015-07-19 DIAGNOSIS — N400 Enlarged prostate without lower urinary tract symptoms: Secondary | ICD-10-CM | POA: Diagnosis not present

## 2015-07-19 DIAGNOSIS — I2510 Atherosclerotic heart disease of native coronary artery without angina pectoris: Secondary | ICD-10-CM | POA: Diagnosis not present

## 2015-07-19 DIAGNOSIS — I10 Essential (primary) hypertension: Secondary | ICD-10-CM | POA: Diagnosis not present

## 2015-07-19 DIAGNOSIS — Z48817 Encounter for surgical aftercare following surgery on the skin and subcutaneous tissue: Secondary | ICD-10-CM | POA: Diagnosis not present

## 2015-07-22 DIAGNOSIS — I10 Essential (primary) hypertension: Secondary | ICD-10-CM | POA: Diagnosis not present

## 2015-07-22 DIAGNOSIS — Z48817 Encounter for surgical aftercare following surgery on the skin and subcutaneous tissue: Secondary | ICD-10-CM | POA: Diagnosis not present

## 2015-07-22 DIAGNOSIS — N400 Enlarged prostate without lower urinary tract symptoms: Secondary | ICD-10-CM | POA: Diagnosis not present

## 2015-07-22 DIAGNOSIS — L97511 Non-pressure chronic ulcer of other part of right foot limited to breakdown of skin: Secondary | ICD-10-CM | POA: Diagnosis not present

## 2015-07-22 DIAGNOSIS — E11610 Type 2 diabetes mellitus with diabetic neuropathic arthropathy: Secondary | ICD-10-CM | POA: Diagnosis not present

## 2015-07-22 DIAGNOSIS — I2510 Atherosclerotic heart disease of native coronary artery without angina pectoris: Secondary | ICD-10-CM | POA: Diagnosis not present

## 2015-07-30 DIAGNOSIS — L97511 Non-pressure chronic ulcer of other part of right foot limited to breakdown of skin: Secondary | ICD-10-CM | POA: Diagnosis not present

## 2015-07-30 DIAGNOSIS — E11610 Type 2 diabetes mellitus with diabetic neuropathic arthropathy: Secondary | ICD-10-CM | POA: Diagnosis not present

## 2015-07-30 DIAGNOSIS — I2510 Atherosclerotic heart disease of native coronary artery without angina pectoris: Secondary | ICD-10-CM | POA: Diagnosis not present

## 2015-07-30 DIAGNOSIS — I10 Essential (primary) hypertension: Secondary | ICD-10-CM | POA: Diagnosis not present

## 2015-07-30 DIAGNOSIS — Z48817 Encounter for surgical aftercare following surgery on the skin and subcutaneous tissue: Secondary | ICD-10-CM | POA: Diagnosis not present

## 2015-07-30 DIAGNOSIS — N400 Enlarged prostate without lower urinary tract symptoms: Secondary | ICD-10-CM | POA: Diagnosis not present

## 2015-08-03 DIAGNOSIS — L97519 Non-pressure chronic ulcer of other part of right foot with unspecified severity: Secondary | ICD-10-CM | POA: Diagnosis not present

## 2015-08-03 DIAGNOSIS — L97509 Non-pressure chronic ulcer of other part of unspecified foot with unspecified severity: Secondary | ICD-10-CM | POA: Diagnosis not present

## 2015-08-03 DIAGNOSIS — M869 Osteomyelitis, unspecified: Secondary | ICD-10-CM | POA: Diagnosis not present

## 2015-08-03 DIAGNOSIS — E1169 Type 2 diabetes mellitus with other specified complication: Secondary | ICD-10-CM | POA: Diagnosis not present

## 2015-08-03 DIAGNOSIS — E11621 Type 2 diabetes mellitus with foot ulcer: Secondary | ICD-10-CM | POA: Diagnosis not present

## 2015-08-04 DIAGNOSIS — I2510 Atherosclerotic heart disease of native coronary artery without angina pectoris: Secondary | ICD-10-CM | POA: Diagnosis not present

## 2015-08-04 DIAGNOSIS — I10 Essential (primary) hypertension: Secondary | ICD-10-CM | POA: Diagnosis not present

## 2015-08-04 DIAGNOSIS — L97511 Non-pressure chronic ulcer of other part of right foot limited to breakdown of skin: Secondary | ICD-10-CM | POA: Diagnosis not present

## 2015-08-04 DIAGNOSIS — Z48817 Encounter for surgical aftercare following surgery on the skin and subcutaneous tissue: Secondary | ICD-10-CM | POA: Diagnosis not present

## 2015-08-04 DIAGNOSIS — N400 Enlarged prostate without lower urinary tract symptoms: Secondary | ICD-10-CM | POA: Diagnosis not present

## 2015-08-04 DIAGNOSIS — E11610 Type 2 diabetes mellitus with diabetic neuropathic arthropathy: Secondary | ICD-10-CM | POA: Diagnosis not present

## 2015-08-05 ENCOUNTER — Ambulatory Visit (INDEPENDENT_AMBULATORY_CARE_PROVIDER_SITE_OTHER): Payer: Medicare Other | Admitting: Cardiology

## 2015-08-05 ENCOUNTER — Encounter: Payer: Self-pay | Admitting: Cardiology

## 2015-08-05 VITALS — BP 134/70 | HR 58 | Ht >= 80 in | Wt 242.4 lb

## 2015-08-05 DIAGNOSIS — I2510 Atherosclerotic heart disease of native coronary artery without angina pectoris: Secondary | ICD-10-CM

## 2015-08-05 DIAGNOSIS — I252 Old myocardial infarction: Secondary | ICD-10-CM | POA: Diagnosis not present

## 2015-08-05 DIAGNOSIS — E782 Mixed hyperlipidemia: Secondary | ICD-10-CM | POA: Diagnosis not present

## 2015-08-05 DIAGNOSIS — I059 Rheumatic mitral valve disease, unspecified: Secondary | ICD-10-CM

## 2015-08-05 DIAGNOSIS — I452 Bifascicular block: Secondary | ICD-10-CM | POA: Diagnosis not present

## 2015-08-05 NOTE — Progress Notes (Signed)
[place]. [address] [place], [place]  [address] Phone: [phone] Fax:  [phone]  Date:  08/05/2015   ID:  [name], DOB 1950-02-11, MRN [id number]  PCP:  Irven Shelling, MD   History of Present Illness: [name] is a 65 y.o. male with coronary artery disease former patient of Dr. [name] with prior stent to ramus in 2006, proximal LAD x 2. His distal RCA was diffusely diseased. There was a fixed inferior defect in 2012 on nuclear stress test. Also has diabetes hypertension hyperlipidemia and sleep apnea. On October 10, 2010 he underwent gastric bypass surgery by Dr. [name] Done. Excellent weight loss of approximately 130 pounds. He is off of his diabetic medications, he has decreased his Lipitor. He is not on any blood pressure medications.  He continues to have first degree A-V block, bifascicular block, sinus bradycardia. This has been present for several years. No high-risk symptoms such as syncope, significant shortness of breath or angina with exercise. He is to continue to monitor this and that it may result in a pacemaker in the future. During surgery, his heart rate was in the 30s at times. Anesthesiologist noted this. I observed another EKG showing heart rate of 48 with bifascicular block. Once again no high-risk symptoms. We have been watching this.  He states that he has had a heart murmur since childhood, mild mitral regurgitation on last echocardiogram in 2011.Doing well, no change in symptoms.  Main issue recently has been his right foot wound. He has been seeing [facility], currently wearing Dr. Sharol Given socks. Seems to be helping.   Excellent lipids.     Wt Readings from Last 3 Encounters:  08/05/15 242 lb 6.4 oz (109.952 kg)  09/14/14 237 lb 6 oz (107.673 kg)  08/04/14 244 lb 12 oz (111.018 kg)     Past Medical History  Diagnosis Date  . Hyperlipidemia   . Coronary artery disease   . Hypothyroidism   . GERD (gastroesophageal reflux disease)   .  Diabetes mellitus     diet-controlled  . Mucoid cyst of joint 09/2011    left thumb  . Hx MRSA infection 02/2006  . Dental crowns present   . Hypertension     hx. of - has not been on med. since losing wt. after gastric bypass  . Heart murmur     aortic  . Bifascicular block   . First degree AV block   . Sleep apnea     sleep study 03/29/2011; no CPAP use  . Heart attack ([place]) 06/2004    Past Surgical History  Procedure Laterality Date  . Roux-en-y gastric bypass  10/10/2010    laparoscopic  . Toe amputation  02/23/2006    left foot second ray amputation  . Coronary angioplasty with stent placement  07/12/2004; 08/03/2004    total of 3 stents  . Cataract extraction  02/2010; 03/2010  . Mass excision  10/04/2011    Procedure: EXCISION MASS;  Surgeon: Wynonia Sours, MD;  Location: [facility];  Service: Orthopedics;  Laterality: Left;  excision cyst debridment ip joint of left thumb  . I&d extremity Right 06/04/2014    Procedure: IRRIGATION AND DEBRIDEMENT EXTREMITY;  Surgeon: [name], MD;  Location: [facility];  Service: Orthopedics;  Laterality: Right;  . Colonoscopy with propofol N/A 09/14/2014    Procedure: COLONOSCOPY WITH PROPOFOL;  Surgeon: Garlan Fair, MD;  Location: [facility];  Service: Endoscopy;  Laterality: N/A;  Current Outpatient Prescriptions  Medication Sig Dispense Refill  . aspirin 81 MG tablet Take 81 mg by mouth daily.      Marland Kitchen atorvastatin (LIPITOR) 10 MG tablet Take 10 mg by mouth daily at 6 PM.     . calcium citrate-vitamin D 500-400 MG-UNIT chewable tablet Chew 1 tablet by mouth 3 (three) times daily.    . Cyanocobalamin (B-12) 500 MCG SUBL Place 1,000 mcg under the tongue daily. Take Monday Wednesday Friday    . fluticasone (FLONASE) 50 MCG/ACT nasal spray Place 2 sprays into both nostrils 2 (two) times daily.    . Iron Combinations (IRON COMPLEX PO) Take 65 mg by mouth daily.     Marland Kitchen levothyroxine (SYNTHROID, LEVOTHROID) 200 MCG tablet  Take 200 mcg by mouth daily.     . metFORMIN (GLUCOPHAGE) 500 MG tablet Take 500 mg by mouth 2 (two) times daily with a meal.    . Multiple Vitamins-Minerals (MULTIVITAMIN PO) Take 1 tablet by mouth 2 (two) times daily. Chewable tablet    . Multiple Vitamins-Minerals (OSTEO COMPLEX PO) Take 1 tablet by mouth 2 (two) times daily. SHAKLEE OSTEO MATRIX    . nitroGLYCERIN (NITROSTAT) 0.4 MG SL tablet Place 1 tablet (0.4 mg total) under the tongue every 5 (five) minutes x 3 doses as needed for chest pain. 25 tablet 5  . OMEGA 3 1000 MG CAPS Take 1 capsule by mouth daily.     . Omeprazole 20 MG TBEC Take 20 mg by mouth every other day.    . Polyethylene Glycol 3350 (MIRALAX PO) Take 1 packet by mouth daily as needed (constipation).     . polyethylene glycol-electrolytes (NULYTELY/GOLYTELY) 420 G solution TAKE AS DIRECTED SEE SHEET FOR INSTRUCTIONS  0  . Probiotic Product (PROBIOTIC FORMULA PO) Take 1 tablet by mouth daily.     Marland Kitchen SILDENAFIL CITRATE PO Take 3-5 tablets by mouth daily as needed (erectile dysfuntion).     . sodium chloride (OCEAN) 0.65 % SOLN nasal spray Place 1 spray into both nostrils as needed for congestion.    . Wheat Dextrin (BENEFIBER PO) Take 1 scoop by mouth daily as needed (for constipation).     . zaleplon (SONATA) 10 MG capsule Take 10 mg by mouth at bedtime as needed for sleep.      No current facility-administered medications for this visit.    Allergies:    Allergies  Allergen Reactions  . Quinolones Itching  . Bactrim [Sulfamethoxazole-Trimethoprim] Itching and Rash  . Moxifloxacin Rash  . Penicillin G Rash  . Penicillins Rash    Social History:  The patient  reports that he has never smoked. He has never used smokeless tobacco. He reports that he does not drink alcohol or use illicit drugs.   Family History  Problem Relation Age of Onset  . Heart disease Mother     ROS:  Please see the history of present illness.   Denies any fevers, chills, orthopnea, PND    All other systems reviewed and negative.   PHYSICAL EXAM: VS:  BP 134/70 mmHg  Pulse 58  Ht 6\' 8"  (2.032 m)  Wt 242 lb 6.4 oz (109.952 kg)  BMI 26.63 kg/m2 Well nourished, well developed, in no acute distress HEENT: normal, [place]/AT, EOMI Neck: no JVD, normal carotid upstroke, radiation of aortic murmur possibly Cardiac:  normal S1, S2; RRR; 2/6 holosystolic apical murmur as well as what appears to be a systolic ejection murmur right upper sternal border with radiation to the carotids Lungs:  clear to auscultation bilaterally, no wheezing, rhonchi or rales Abd: soft, nontender, no hepatomegaly, no bruits Ext: no edema, 2+ distal pulses, leg boot Skin: warm and dry GU: deferred Neuro: no focal abnormalities noted, AAO x 3  EKG:  Today 08/05/15-sinus bradycardia heart rate 58 with first-degree AV block, right bundle branch block, left anterior fascicular block, bifascicular block personally viewed-prior 08/04/2014 sinus bradycardia with first-degree AV block, right bundle block, left anterior fascicular block, PR interval 252 ms personally viewed, no significant change from prior 07/29/13-sinus rhythm, first degree AV block 242 ms, right bundle branch block, left anterior fascicular block, bifascicular block   Nuclear stress test-2012-low risk, fixed inferior wall defect. No ischemia.  Echocardiogram 2011-normal EF, mild MR, aortic calcification  Labs-3/15-LDL 63  ASSESSMENT AND PLAN:  1. Coronary artery disease-previous stents (3 total) to ramus, LAD proximal, diffusely diseased RCA. Continuing with current aggressive medical management. Aspirin. No anginal symptoms. Currently not on beta blocker because of this and bifascicular block. Avoid AV nodal blocking agents. Last nuclear stress test as above, low risk with no ischemia. 2. Old myocardial infarction-inferior. 3. Hyperlipidemia-currently on low-dose Lipitor because of significantly low LDL. LDL 63. 4. Bifascicular block-no high-risk  symptoms such as syncope. Avoiding AV nodal blocking agents. 5. Heart murmur-states that he is had a murmur since childhood-prior mitral regurgitation-mild, aortic valve calcification. I do hear radiation of murmur to carotids. Perhaps aortic stenosis is now present. I will check echocardiogram since it is been 5 years. 6. Right foot wound-[facility], hyperbaric, surgery. 2016. Going to try [facility]. Wearing Dr. Jess Barters sock.  7. Annual followup  Signed, Candee Furbish, MD [facility]  08/05/2015 9:32 AM

## 2015-08-05 NOTE — Patient Instructions (Signed)

## 2015-08-11 DIAGNOSIS — I10 Essential (primary) hypertension: Secondary | ICD-10-CM | POA: Diagnosis not present

## 2015-08-11 DIAGNOSIS — L97511 Non-pressure chronic ulcer of other part of right foot limited to breakdown of skin: Secondary | ICD-10-CM | POA: Diagnosis not present

## 2015-08-11 DIAGNOSIS — Z48817 Encounter for surgical aftercare following surgery on the skin and subcutaneous tissue: Secondary | ICD-10-CM | POA: Diagnosis not present

## 2015-08-11 DIAGNOSIS — N400 Enlarged prostate without lower urinary tract symptoms: Secondary | ICD-10-CM | POA: Diagnosis not present

## 2015-08-11 DIAGNOSIS — E11610 Type 2 diabetes mellitus with diabetic neuropathic arthropathy: Secondary | ICD-10-CM | POA: Diagnosis not present

## 2015-08-11 DIAGNOSIS — I2510 Atherosclerotic heart disease of native coronary artery without angina pectoris: Secondary | ICD-10-CM | POA: Diagnosis not present

## 2015-08-24 DIAGNOSIS — L97519 Non-pressure chronic ulcer of other part of right foot with unspecified severity: Secondary | ICD-10-CM | POA: Diagnosis not present

## 2015-08-24 DIAGNOSIS — E11621 Type 2 diabetes mellitus with foot ulcer: Secondary | ICD-10-CM | POA: Diagnosis not present

## 2015-08-24 DIAGNOSIS — M869 Osteomyelitis, unspecified: Secondary | ICD-10-CM | POA: Diagnosis not present

## 2015-08-24 DIAGNOSIS — L97509 Non-pressure chronic ulcer of other part of unspecified foot with unspecified severity: Secondary | ICD-10-CM | POA: Diagnosis not present

## 2015-08-24 DIAGNOSIS — E1169 Type 2 diabetes mellitus with other specified complication: Secondary | ICD-10-CM | POA: Diagnosis not present

## 2015-09-06 DIAGNOSIS — E11610 Type 2 diabetes mellitus with diabetic neuropathic arthropathy: Secondary | ICD-10-CM | POA: Diagnosis not present

## 2015-09-06 DIAGNOSIS — E11621 Type 2 diabetes mellitus with foot ulcer: Secondary | ICD-10-CM | POA: Diagnosis not present

## 2015-09-06 DIAGNOSIS — L97412 Non-pressure chronic ulcer of right heel and midfoot with fat layer exposed: Secondary | ICD-10-CM | POA: Diagnosis not present

## 2015-09-07 DIAGNOSIS — E11610 Type 2 diabetes mellitus with diabetic neuropathic arthropathy: Secondary | ICD-10-CM | POA: Insufficient documentation

## 2015-09-14 DIAGNOSIS — E11621 Type 2 diabetes mellitus with foot ulcer: Secondary | ICD-10-CM | POA: Diagnosis not present

## 2015-09-14 DIAGNOSIS — L97519 Non-pressure chronic ulcer of other part of right foot with unspecified severity: Secondary | ICD-10-CM | POA: Diagnosis not present

## 2015-09-14 DIAGNOSIS — E1169 Type 2 diabetes mellitus with other specified complication: Secondary | ICD-10-CM | POA: Diagnosis not present

## 2015-09-14 DIAGNOSIS — M869 Osteomyelitis, unspecified: Secondary | ICD-10-CM | POA: Diagnosis not present

## 2015-09-14 DIAGNOSIS — L97509 Non-pressure chronic ulcer of other part of unspecified foot with unspecified severity: Secondary | ICD-10-CM | POA: Diagnosis not present

## 2015-09-25 ENCOUNTER — Emergency Department (HOSPITAL_BASED_OUTPATIENT_CLINIC_OR_DEPARTMENT_OTHER): Payer: Medicare Other

## 2015-09-25 ENCOUNTER — Encounter (HOSPITAL_BASED_OUTPATIENT_CLINIC_OR_DEPARTMENT_OTHER): Payer: Self-pay | Admitting: Respiratory Therapy

## 2015-09-25 ENCOUNTER — Observation Stay (HOSPITAL_BASED_OUTPATIENT_CLINIC_OR_DEPARTMENT_OTHER)
Admission: EM | Admit: 2015-09-25 | Discharge: 2015-09-26 | Disposition: A | Discharge status: Home / Self Care | Payer: Medicare Other | Attending: Family Medicine | Admitting: Family Medicine

## 2015-09-25 DIAGNOSIS — E119 Type 2 diabetes mellitus without complications: Secondary | ICD-10-CM | POA: Diagnosis not present

## 2015-09-25 DIAGNOSIS — I25700 Atherosclerosis of coronary artery bypass graft(s), unspecified, with unstable angina pectoris: Secondary | ICD-10-CM

## 2015-09-25 DIAGNOSIS — I110 Hypertensive heart disease with heart failure: Secondary | ICD-10-CM | POA: Insufficient documentation

## 2015-09-25 DIAGNOSIS — R079 Chest pain, unspecified: Secondary | ICD-10-CM | POA: Diagnosis not present

## 2015-09-25 DIAGNOSIS — I5032 Chronic diastolic (congestive) heart failure: Secondary | ICD-10-CM | POA: Diagnosis present

## 2015-09-25 DIAGNOSIS — E782 Mixed hyperlipidemia: Secondary | ICD-10-CM | POA: Diagnosis present

## 2015-09-25 DIAGNOSIS — Z79899 Other long term (current) drug therapy: Secondary | ICD-10-CM | POA: Diagnosis not present

## 2015-09-25 DIAGNOSIS — Z7984 Long term (current) use of oral hypoglycemic drugs: Secondary | ICD-10-CM | POA: Insufficient documentation

## 2015-09-25 DIAGNOSIS — Z9884 Bariatric surgery status: Secondary | ICD-10-CM | POA: Diagnosis not present

## 2015-09-25 DIAGNOSIS — R0789 Other chest pain: Secondary | ICD-10-CM | POA: Diagnosis not present

## 2015-09-25 DIAGNOSIS — I2510 Atherosclerotic heart disease of native coronary artery without angina pectoris: Secondary | ICD-10-CM | POA: Insufficient documentation

## 2015-09-25 DIAGNOSIS — E038 Other specified hypothyroidism: Secondary | ICD-10-CM

## 2015-09-25 DIAGNOSIS — I25810 Atherosclerosis of coronary artery bypass graft(s) without angina pectoris: Secondary | ICD-10-CM | POA: Diagnosis present

## 2015-09-25 DIAGNOSIS — E039 Hypothyroidism, unspecified: Secondary | ICD-10-CM | POA: Diagnosis not present

## 2015-09-25 DIAGNOSIS — I453 Trifascicular block: Secondary | ICD-10-CM | POA: Diagnosis present

## 2015-09-25 DIAGNOSIS — I452 Bifascicular block: Secondary | ICD-10-CM | POA: Insufficient documentation

## 2015-09-25 DIAGNOSIS — Z955 Presence of coronary angioplasty implant and graft: Secondary | ICD-10-CM | POA: Insufficient documentation

## 2015-09-25 DIAGNOSIS — R072 Precordial pain: Secondary | ICD-10-CM | POA: Diagnosis not present

## 2015-09-25 DIAGNOSIS — R001 Bradycardia, unspecified: Secondary | ICD-10-CM | POA: Diagnosis present

## 2015-09-25 HISTORY — DX: Trifascicular block: I45.3

## 2015-09-25 HISTORY — DX: Morbid (severe) obesity due to excess calories: E66.01

## 2015-09-25 LAB — BASIC METABOLIC PANEL
Anion gap: 9 (ref 5–15)
BUN: 12 mg/dL (ref 6–20)
CO2: 28 mmol/L (ref 22–32)
Calcium: 9.3 mg/dL (ref 8.9–10.3)
Chloride: 99 mmol/L — ABNORMAL LOW (ref 101–111)
Creatinine, Ser: 0.95 mg/dL (ref 0.61–1.24)
GFR calc Af Amer: >60 mL/min (ref >60)
GFR calc non Af Amer: >60 mL/min (ref >60)
Glucose, Bld: 135 mg/dL — ABNORMAL HIGH (ref 65–99)
Potassium: 4 mmol/L (ref 3.5–5.1)
Sodium: 136 mmol/L (ref 135–145)

## 2015-09-25 LAB — CBC
HCT: 45.2 % (ref 39.0–52.0)
Hemoglobin: 15.9 g/dL (ref 13.0–17.0)
MCH: 31.5 pg (ref 26.0–34.0)
MCHC: 35.2 g/dL (ref 30.0–36.0)
MCV: 89.7 fL (ref 78.0–100.0)
Platelets: 215 10*3/uL (ref 150–400)
RBC: 5.04 MIL/uL (ref 4.22–5.81)
RDW: 13.7 % (ref 11.5–15.5)
WBC: 9.1 10*3/uL (ref 4.0–10.5)

## 2015-09-25 LAB — BRAIN NATRIURETIC PEPTIDE: B Natriuretic Peptide: 52.5 pg/mL (ref 0.0–100.0)

## 2015-09-25 LAB — TROPONIN I
Troponin I: <0.03 ng/mL (ref <0.03)
Troponin I: <0.03 ng/mL (ref <0.03)

## 2015-09-25 MED ORDER — ONDANSETRON HCL 4 MG/2ML IJ SOLN: 4.0000 mg | Freq: Four times a day (QID) | INTRAMUSCULAR | Status: DC | PRN

## 2015-09-25 MED ORDER — ASPIRIN EC 81 MG PO TBEC: 81.0000 mg | DELAYED_RELEASE_TABLET | Freq: Every day | ORAL | Status: DC

## 2015-09-25 MED ORDER — LEVOTHYROXINE SODIUM 100 MCG PO TABS
200.0000 ug | ORAL_TABLET | Freq: Every day | ORAL | Status: DC
  Administered 2015-09-26: 200 ug via ORAL
  Filled 2015-09-25: qty 2

## 2015-09-25 MED ORDER — ENOXAPARIN SODIUM 40 MG/0.4ML ~~LOC~~ SOLN
40.0000 mg | SUBCUTANEOUS | Status: DC
  Administered 2015-09-25: 40 mg via SUBCUTANEOUS
  Filled 2015-09-25: qty 0.4

## 2015-09-25 MED ORDER — RISAQUAD PO CAPS
1.0000 | ORAL_CAPSULE | Freq: Every day | ORAL | Status: DC
  Administered 2015-09-26: 1 via ORAL
  Filled 2015-09-25: qty 1

## 2015-09-25 MED ORDER — CALCIUM CITRATE-VITAMIN D 500-400 MG-UNIT PO CHEW
1.0000 | CHEWABLE_TABLET | Freq: Three times a day (TID) | ORAL | Status: DC
  Filled 2015-09-25: qty 1

## 2015-09-25 MED ORDER — OMEGA-3-ACID ETHYL ESTERS 1 G PO CAPS
1.0000 g | ORAL_CAPSULE | Freq: Every day | ORAL | Status: DC
  Administered 2015-09-26: 1 g via ORAL
  Filled 2015-09-25: qty 1

## 2015-09-25 MED ORDER — PANTOPRAZOLE SODIUM 40 MG PO TBEC
40.0000 mg | DELAYED_RELEASE_TABLET | Freq: Two times a day (BID) | ORAL | Status: DC
  Administered 2015-09-25 – 2015-09-26 (×2): 40 mg via ORAL
  Filled 2015-09-25 (×2): qty 1

## 2015-09-25 MED ORDER — ZOLPIDEM TARTRATE 5 MG PO TABS: 5.0000 mg | ORAL_TABLET | Freq: Every evening | ORAL | Status: DC | PRN

## 2015-09-25 MED ORDER — INSULIN ASPART 100 UNIT/ML ~~LOC~~ SOLN: 0.0000 [IU] | Freq: Every day | SUBCUTANEOUS | Status: DC

## 2015-09-25 MED ORDER — SALINE SPRAY 0.65 % NA SOLN
1.0000 | NASAL | Status: DC | PRN
  Filled 2015-09-25: qty 44

## 2015-09-25 MED ORDER — GLUCOSAMINE-CHONDROITIN 500-400 MG PO TABS: 1.0000 | ORAL_TABLET | Freq: Two times a day (BID) | ORAL | Status: DC

## 2015-09-25 MED ORDER — ASPIRIN 81 MG PO CHEW
81.0000 mg | CHEWABLE_TABLET | Freq: Every day | ORAL | Status: DC
  Administered 2015-09-26: 81 mg via ORAL
  Filled 2015-09-25: qty 1

## 2015-09-25 MED ORDER — NITROGLYCERIN 0.4 MG SL SUBL: 0.4000 mg | SUBLINGUAL_TABLET | SUBLINGUAL | Status: DC | PRN

## 2015-09-25 MED ORDER — ASPIRIN 81 MG PO CHEW
324.0000 mg | CHEWABLE_TABLET | Freq: Once | ORAL | Status: AC
  Administered 2015-09-25: 243 mg via ORAL
  Filled 2015-09-25: qty 4

## 2015-09-25 MED ORDER — CALCIUM CARBONATE-VITAMIN D 500-200 MG-UNIT PO TABS
1.0000 | ORAL_TABLET | Freq: Three times a day (TID) | ORAL | Status: DC
  Administered 2015-09-26 (×2): 1 via ORAL
  Filled 2015-09-25 (×2): qty 1

## 2015-09-25 MED ORDER — INSULIN ASPART 100 UNIT/ML ~~LOC~~ SOLN
0.0000 [IU] | Freq: Three times a day (TID) | SUBCUTANEOUS | Status: DC
Start: 2015-09-26 — End: 2015-09-26
  Administered 2015-09-26: 2 [IU] via SUBCUTANEOUS
  Administered 2015-09-26: 5 [IU] via SUBCUTANEOUS

## 2015-09-25 MED ORDER — ONDANSETRON HCL 4 MG/2ML IJ SOLN: 4.0000 mg | Freq: Three times a day (TID) | INTRAMUSCULAR | Status: DC | PRN

## 2015-09-25 MED ORDER — ATORVASTATIN CALCIUM 10 MG PO TABS
10.0000 mg | ORAL_TABLET | Freq: Every day | ORAL | Status: DC
  Administered 2015-09-25: 10 mg via ORAL
  Filled 2015-09-25: qty 1

## 2015-09-25 MED ORDER — ACETAMINOPHEN 325 MG PO TABS: 650.0000 mg | ORAL_TABLET | ORAL | Status: DC | PRN

## 2015-09-25 MED ORDER — FLUTICASONE PROPIONATE 50 MCG/ACT NA SUSP
2.0000 | Freq: Every day | NASAL | Status: DC | PRN
  Filled 2015-09-25: qty 16

## 2015-09-25 NOTE — H&P (Signed)
History and Physical    [name] [id number] DOB: 1950-07-26 DOA: 09/25/2015  PCP: Irven Shelling, MD   Patient coming from: Home, via [facility]   Chief Complaint: Chest discomfort   HPI: [name] is a 65 y.o. male with medical history significant for coronary artery disease with 3 stents placed in 2006, hypothyroidism, chronic diastolic CHF, non-insulin-dependent diabetes mellitus, bifascicular heart block with sinus bradycardia, and GERD who presents in transfer from [facility] for evaluation of chest discomfort. Patient reports being in his usual state of health throughout the day yesterday until last night when he developed a fairly sudden onset of a mild left sided chest discomfort while at rest. There was no associated nausea, diaphoresis, or dyspnea. The discomfort spontaneously resolved over the course of a couple minutes, but has been recurring since that time. Patient is followed by cardiology in the outpatient setting and had a nuclear medicine stress test in 2012 with a fixed inferior defect. He underwent a gastric bypass in 2012 and has since lost 130 pounds and was able to come off some of his diabetes and hypertension medications. He describes the current discomfort as an ache in the left chest without radiation, mild in intensity, and spontaneously resolving over the course of a couple minutes without any intervention.   [facility] ED Course: Upon arrival to the [facility] ED, patient is found to be afebrile, saturating well on room air, bradycardic in the low 50s, and with stable blood pressure. EKG features sinus rhythm with right bundle branch block and left anterior fascicular block with no significant change from priors. Initial troponin is undetectable and chest x-ray is negative for acute cardiopulmonary disease. Chemistry panel is largely unremarkable and CBC is entirely within normal limits. Patient was given a 324 mg dosing in the outside ED. He continued to have some  occasional fleeting chest discomfort, but reported that it had been improving. He is been admitted to the telemetry unit for ongoing evaluation and management of chest discomfort concerning for possible ACS in a patient with known  coronary artery disease.  Review of Systems:  All other systems reviewed and apart from HPI, are negative.  Past Medical History:  Diagnosis Date  . Bifascicular block   . Coronary artery disease   . Dental crowns present   . Diabetes mellitus    diet-controlled  . First degree AV block   . GERD (gastroesophageal reflux disease)   . Heart attack ([place]) 06/2004  . Heart murmur    aortic  . Hx MRSA infection 02/2006  . Hyperlipidemia   . Hypertension    hx. of - has not been on med. since losing wt. after gastric bypass  . Hypothyroidism   . Mucoid cyst of joint 09/2011   left thumb  . Sleep apnea    sleep study 03/29/2011; no CPAP use    Past Surgical History:  Procedure Laterality Date  . CATARACT EXTRACTION  02/2010; 03/2010  . COLONOSCOPY WITH PROPOFOL N/A 09/14/2014   Procedure: COLONOSCOPY WITH PROPOFOL;  Surgeon: Garlan Fair, MD;  [facility];  Service: Endoscopy;  Laterality: N/A;  . CORONARY ANGIOPLASTY WITH STENT PLACEMENT  07/12/2004; 08/03/2004   total of 3 stents  . I&D EXTREMITY Right 06/04/2014   Procedure: IRRIGATION AND DEBRIDEMENT EXTREMITY;  Surgeon: [name], MD;  Location: [facility];  Service: Orthopedics;  Laterality: Right;  . MASS EXCISION  10/04/2011   Procedure: EXCISION MASS;  Surgeon: Wynonia Sours, MD;  Location:  [facility];  Service: Orthopedics;  Laterality: Left;  excision cyst debridment ip joint of left thumb  . PROSTATECTOMY    . ROUX-EN-Y GASTRIC BYPASS  10/10/2010   laparoscopic  . TOE AMPUTATION  02/23/2006   left foot second ray amputation     reports that he has never smoked. He has never used smokeless tobacco. He reports that he does not drink alcohol or use drugs.  Allergies    Allergen Reactions  . Quinolones Itching  . Bactrim [Sulfamethoxazole-Trimethoprim] Itching and Rash  . Moxifloxacin Rash  . Penicillin G Rash  . Penicillins Rash    Family History  Problem Relation Age of Onset  . Heart disease Mother      Prior to Admission medications   Medication Sig Start Date End Date Taking? Authorizing Provider  aspirin 81 MG tablet Take 81 mg by mouth daily.     Yes Historical Provider, MD  atorvastatin (LIPITOR) 10 MG tablet Take 10 mg by mouth daily at 6 PM.    Yes Historical Provider, MD  calcium citrate-vitamin D 500-400 MG-UNIT chewable tablet Chew 1 tablet by mouth 3 (three) times daily.   Yes Historical Provider, MD  Cyanocobalamin (B-12) 500 MCG SUBL Place 1,000 mcg under the tongue 3 (three) times a week. Take Monday Wednesday Friday   Yes Historical Provider, MD  fluticasone (FLONASE) 50 MCG/ACT nasal spray Place 2 sprays into both nostrils daily as needed for allergies.    Yes Historical Provider, MD  glucosamine-chondroitin 500-400 MG tablet Take 1 tablet by mouth 2 (two) times daily.   Yes Historical Provider, MD  Iron Combinations (IRON COMPLEX PO) Take 65 mg by mouth daily.    Yes Historical Provider, MD  levothyroxine (SYNTHROID, LEVOTHROID) 200 MCG tablet Take 200 mcg by mouth daily.  03/07/10  Yes Historical Provider, MD  metFORMIN (GLUCOPHAGE) 500 MG tablet Take 500 mg by mouth 2 (two) times daily with a meal.   Yes Historical Provider, MD  Multiple Vitamins-Minerals (MULTIVITAMIN PO) Take 1 tablet by mouth 2 (two) times daily. Chewable tablet   Yes Historical Provider, MD  Multiple Vitamins-Minerals (OSTEO COMPLEX PO) Take 1 tablet by mouth 2 (two) times daily. SHAKLEE OSTEO MATRIX 03/07/10  Yes Historical Provider, MD  nitroGLYCERIN (NITROSTAT) 0.4 MG SL tablet Place 1 tablet (0.4 mg total) under the tongue every 5 (five) minutes x 3 doses as needed for chest pain. 11/26/14  Yes Jerline Pain, MD  OMEGA 3 1000 MG CAPS Take 1 capsule by mouth  daily.    Yes Historical Provider, MD  Omeprazole 20 MG TBEC Take 20 mg by mouth every other day. 03/07/10  Yes Historical Provider, MD  Probiotic Product (PROBIOTIC FORMULA PO) Take 1 tablet by mouth daily.    Yes Historical Provider, MD  SILDENAFIL CITRATE PO Take 3-5 tablets by mouth daily as needed (erectile dysfuntion).    Yes Historical Provider, MD  sodium chloride (OCEAN) 0.65 % SOLN nasal spray Place 1 spray into both nostrils as needed for congestion.   Yes Historical Provider, MD  Wheat Dextrin (BENEFIBER PO) Take 1 scoop by mouth daily as needed (for constipation).    Yes Historical Provider, MD  zaleplon (SONATA) 10 MG capsule Take 10 mg by mouth at bedtime as needed for sleep.  03/07/10  Yes Historical Provider, MD  polyethylene glycol-electrolytes (NULYTELY/GOLYTELY) 420 G solution TAKE AS DIRECTED SEE SHEET FOR INSTRUCTIONS 06/03/14   Historical Provider, MD    Physical Exam: Vitals:   09/25/15  1630 09/25/15 1645 09/25/15 1700 09/25/15 1847  BP: 131/72 139/69 130/74 (!) 155/73  Pulse: (!) 43 (!) 55 (!) 43 64  Resp: 11 16 15    Temp:    98.2 F (36.8 C)  TempSrc:    Oral  SpO2: 96% 99% 90% 99%  Weight:    107.4 kg (236 lb 11.2 oz)  Height:    6\' 8"  (2.032 m)      Constitutional: NAD, calm, comfortable, eating dinner Eyes: PERTLA, lids and conjunctivae normal ENMT: Mucous membranes are moist. Posterior pharynx clear of any exudate or lesions.   Neck: normal, supple, no masses, no thyromegaly Respiratory: clear to auscultation bilaterally, no wheezing, no crackles. Normal respiratory effort. No accessory muscle use.  Cardiovascular: Rate [id number] with soft systolic murmur at apex. No significant extremity edema. No carotid bruits. No significant JVD. Abdomen: No distension, no tenderness, no masses palpated. Bowel sounds normal.  Musculoskeletal: no clubbing / cyanosis. Right foot deformity and chronic ulcer. Normal muscle tone.  Skin: no significant rashes, lesions, ulcers  aside from right foot. Warm, dry, well-perfused. Neurologic: CN 2-12 grossly intact. Sensation intact, DTR normal. Strength 5/5 in all 4 limbs.  Psychiatric: Normal judgment and insight. Alert and oriented x 3. Normal mood and affect.     Labs on Admission: I have personally reviewed following labs and imaging studies  CBC:  Recent Labs Lab 09/25/15 1409  WBC 9.1  HGB 15.9  HCT 45.2  MCV 89.7  PLT 123456   Basic Metabolic Panel:  Recent Labs Lab 09/25/15 1409  NA 136  K 4.0  CL 99*  CO2 28  GLUCOSE 135*  BUN 12  CREATININE 0.95  CALCIUM 9.3   GFR: Estimated Creatinine Clearance: 102.5 mL/min (by C-G formula based on SCr of 0.95 mg/dL). Liver Function Tests: No results for input(s): AST, ALT, ALKPHOS, BILITOT, PROT, ALBUMIN in the last 168 hours. No results for input(s): LIPASE, AMYLASE in the last 168 hours. No results for input(s): AMMONIA in the last 168 hours. Coagulation Profile: No results for input(s): INR, PROTIME in the last 168 hours. Cardiac Enzymes:  Recent Labs Lab 09/25/15 1409  TROPONINI <0.03   BNP (last 3 results) No results for input(s): PROBNP in the last 8760 hours. HbA1C: No results for input(s): HGBA1C in the last 72 hours. CBG: No results for input(s): GLUCAP in the last 168 hours. Lipid Profile: No results for input(s): CHOL, HDL, LDLCALC, TRIG, CHOLHDL, LDLDIRECT in the last 72 hours. Thyroid Function Tests: No results for input(s): TSH, T4TOTAL, FREET4, T3FREE, THYROIDAB in the last 72 hours. Anemia Panel: No results for input(s): VITAMINB12, FOLATE, FERRITIN, TIBC, IRON, RETICCTPCT in the last 72 hours. Urine analysis: No results found for: COLORURINE, APPEARANCEUR, LABSPEC, PHURINE, GLUCOSEU, HGBUR, BILIRUBINUR, KETONESUR, PROTEINUR, UROBILINOGEN, NITRITE, LEUKOCYTESUR Sepsis Labs: @LABRCNTIP (procalcitonin:4,lacticidven:4) )No results found for this or any previous visit (from the past 240 hour(s)).   Radiological Exams on  Admission: Dg Chest 2 View  Result Date: 09/25/2015 CLINICAL DATA:  Mid sternal chest discomfort since last night, history coronary artery disease with stenting, MI, hypertension EXAM: CHEST  2 VIEW COMPARISON:  10/02/2011 FINDINGS: Normal heart size, mediastinal contours, and pulmonary vascularity. Lungs clear. No pleural effusion or pneumothorax. Bones unremarkable. IMPRESSION: No acute abnormalities. Electronically Signed   By: Lavonia Dana M.D.   On: 09/25/2015 14:42    EKG: Independently reviewed. Sinus rhythm, RBBB, LaFB  Assessment/Plan  1. Chest discomfort, Hx of CAD  - Fleeting, but recurring "funny feeling" to left chest  while at rest for past day, improving spontaneously  - Hx of stents to ramus and LAD x2 in 2006 - Nuc med stress test in 2012 had a fixed defect inferiorly, but inducible/reversible ischemia  - Initial EKG features bifascicular block but unchanged from priors  - Initial troponin <0.03  - Given the hx of CAD, pt was [facility] from [facility] for ACS r/o  - Plan to monitor on telemetry for ischemic changes, obtain serial cardiac biomarkers, repeat EKG in am (sooner for angina), continue Lipitor and daily ASA 81 - ASA 324 mg given at outside hospital PTA; beta-blocker held d/t sinus bradycardia   2. Bifascicular block with sinus bradycardia  - Chronic, stable, followed by cardiology in outpatient setting  - Avoiding beta-blocker or other AV nodal blockade  - Repeat EKG in am, monitoring on telemetry  3. Chronic diastolic CHF  - Appears euvolemic on presentation  - TTE (09/24/14) with EF 0000000, grade 2 diastolic dysfunction, mild concentric hypertrophy, mild AR, mild MR, mild TR  - No beta-blocker d/t bradycardia  - Not on ACE/ARB, would consider adding, but defer to his cardiologist  - Follow daily wts and I/O's   4. Hypothyroidism  - Appears to be stable - Continue current-dose Synthroid    5. Type II DM  - A1c 7.0% in May 2016  - Managed with metformin only  since his bariatric surgery and subsequent wt loss  - Hold metformin while in hospital  - Check CBG with meals and qHS - Institute a low-intensity sliding-scale insulin and adjust prn   6. Non-healing right foot wound - Hx of Charcot foot with non-healing ulcer on right foot followed by [facility]  - He had been given options that included BKA, local surgery with estimated 50% chance of success, or live with the wound; he chose to live with it for now and continues to follow with the wound clinic  - No sign active infection    DVT prophylaxis: sq Lovenox  Code Status: Full  Family Communication: Wife updated at bedside  Disposition Plan: Observe on telemetry Consults called: None Admission status: Observation    Vianne Bulls, MD [facility] Pager [phone]  If 7PM-7AM, please contact night-coverage [email or web address] Password [facility]  09/25/2015, 7:54 PM

## 2015-09-25 NOTE — ED Provider Notes (Signed)
[place] DEPT [facility] Provider Note   CSN: [id number] Arrival date & time: 09/25/15  1357     History   Chief Complaint Chief Complaint  Patient presents with  . Chest Pain    HPI [name] is a 65 y.o. male.  History of coronary disease status post stenting in 2006, since that time has had several cardiac evaluations including in 2012 prior to having gastric bypass surgery when he had a stress test which was unremarkable according to the patient's report. Overall the patient has done well however he does not exercise very much at all. He reports that last night around 10:00 PM he developed acute onset of left chest discomfort. This is poorly described, he states that he cannot tell you what it feels like that it feels abnormal, he is concerned, it does not radiate to his jaw, back, neck. He has no shortness of breath, nausea or diaphoresis. He has chronic swelling of his right foot secondary to multiple foot procedures, nothing recently. He denies any fevers, chills, coughing, abdominal discomfort or any other symptoms. He took 1 baby aspirin, his pain at this time has finally resolved. He woke up this morning with the same pain that he had last night, it was present for most of the day until spontaneous resolution. His primary cardiologist is Dr. Candee Furbish  The patient's medical history consists of hypercholesterolemia, hypertension and diabetes as well as hypothyroidism. After his weight loss from gastric bypass procedure he has not had to take any antihypertensives.  He took 1 baby aspirin prior to arrival     Past Medical History:  Diagnosis Date  . Bifascicular block   . Coronary artery disease   . Dental crowns present   . Diabetes mellitus    diet-controlled  . First degree AV block   . GERD (gastroesophageal reflux disease)   . Heart attack ([place]) 06/2004  . Heart murmur    aortic  . Hx MRSA infection 02/2006  . Hyperlipidemia   . Hypertension    hx. of - has not  been on med. since losing wt. after gastric bypass  . Hypothyroidism   . Mucoid cyst of joint 09/2011   left thumb  . Sleep apnea    sleep study 03/29/2011; no CPAP use    Patient Active Problem List   Diagnosis Date Noted  . Chest pain 09/25/2015  . Aortic valvular disorder 08/04/2014  . Disorder of mitral valve 08/04/2014  . Diabetic foot infection ([place]) 06/03/2014  . Peripheral vascular disease ([place]) 06/03/2014  . Abscess of foot 06/03/2014  . Atherosclerosis of native coronary artery of native heart without angina pectoris 07/29/2013  . Bifascicular block 07/29/2013  . 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 Hypothyroidism 01/22/2007  . HYPERLIPIDEMIA, MIXED 01/22/2007  . MYOCARDIAL INFARCTION, HX OF 01/22/2007  . DEGENERATIVE JOINT DISEASE 01/22/2007  . ANEMIA, IRON DEFICIENCY, HX OF 01/22/2007    Past Surgical History:  Procedure Laterality Date  . CATARACT EXTRACTION  02/2010; 03/2010  . COLONOSCOPY WITH PROPOFOL N/A 09/14/2014   Procedure: COLONOSCOPY WITH PROPOFOL;  Surgeon: Garlan Fair, MD;  [facility];  Service: Endoscopy;  Laterality: N/A;  . CORONARY ANGIOPLASTY WITH STENT PLACEMENT  07/12/2004; 08/03/2004   total of 3 stents  . I&D EXTREMITY Right 06/04/2014   Procedure: IRRIGATION AND DEBRIDEMENT EXTREMITY;  Surgeon: [name], MD;  Location: [facility];  Service: Orthopedics;  Laterality: Right;  . MASS EXCISION  10/04/2011  Procedure: EXCISION MASS;  Surgeon: Wynonia Sours, MD;  Location: [place];  Service: Orthopedics;  Laterality: Left;  excision cyst debridment ip joint of left thumb  . PROSTATECTOMY    . ROUX-EN-Y GASTRIC BYPASS  10/10/2010   laparoscopic  . TOE AMPUTATION  02/23/2006   left foot second ray amputation       Home Medications    Prior to Admission medications   Medication Sig Start Date End Date Taking? Authorizing Provider  aspirin 81 MG tablet Take 81 mg by mouth daily.      Historical  Provider, MD  atorvastatin (LIPITOR) 10 MG tablet Take 10 mg by mouth daily at 6 PM.     Historical Provider, MD  calcium citrate-vitamin D 500-400 MG-UNIT chewable tablet Chew 1 tablet by mouth 3 (three) times daily.    Historical Provider, MD  Cyanocobalamin (B-12) 500 MCG SUBL Place 1,000 mcg under the tongue daily. Take Monday Wednesday Friday    Historical Provider, MD  fluticasone (FLONASE) 50 MCG/ACT nasal spray Place 2 sprays into both nostrils 2 (two) times daily.    Historical Provider, MD  Iron Combinations (IRON COMPLEX PO) Take 65 mg by mouth daily.     Historical Provider, MD  levothyroxine (SYNTHROID, LEVOTHROID) 200 MCG tablet Take 200 mcg by mouth daily.  03/07/10   Historical Provider, MD  metFORMIN (GLUCOPHAGE) 500 MG tablet Take 500 mg by mouth 2 (two) times daily with a meal.    Historical Provider, MD  Multiple Vitamins-Minerals (MULTIVITAMIN PO) Take 1 tablet by mouth 2 (two) times daily. Chewable tablet    Historical Provider, MD  Multiple Vitamins-Minerals (OSTEO COMPLEX PO) Take 1 tablet by mouth 2 (two) times daily. SHAKLEE OSTEO MATRIX 03/07/10   Historical Provider, MD  nitroGLYCERIN (NITROSTAT) 0.4 MG SL tablet Place 1 tablet (0.4 mg total) under the tongue every 5 (five) minutes x 3 doses as needed for chest pain. 11/26/14   Jerline Pain, MD  OMEGA 3 1000 MG CAPS Take 1 capsule by mouth daily.     Historical Provider, MD  Omeprazole 20 MG TBEC Take 20 mg by mouth every other day. 03/07/10   Historical Provider, MD  Polyethylene Glycol 3350 (MIRALAX PO) Take 1 packet by mouth daily as needed (constipation).     Historical Provider, MD  polyethylene glycol-electrolytes (NULYTELY/GOLYTELY) 420 G solution TAKE AS DIRECTED SEE SHEET FOR INSTRUCTIONS 06/03/14   Historical Provider, MD  Probiotic Product (PROBIOTIC FORMULA PO) Take 1 tablet by mouth daily.     Historical Provider, MD  SILDENAFIL CITRATE PO Take 3-5 tablets by mouth daily as needed (erectile dysfuntion).      Historical Provider, MD  sodium chloride (OCEAN) 0.65 % SOLN nasal spray Place 1 spray into both nostrils as needed for congestion.    Historical Provider, MD  Wheat Dextrin (BENEFIBER PO) Take 1 scoop by mouth daily as needed (for constipation).     Historical Provider, MD  zaleplon (SONATA) 10 MG capsule Take 10 mg by mouth at bedtime as needed for sleep.  03/07/10   Historical Provider, MD    Family History Family History  Problem Relation Age of Onset  . Heart disease Mother     Social History Social History  Substance Use Topics  . Smoking status: Never Smoker  . Smokeless tobacco: Never Used  . Alcohol use No     Allergies   Quinolones; Bactrim [sulfamethoxazole-trimethoprim]; Moxifloxacin; Penicillin g; and Penicillins   Review of Systems Review of  Systems  All other systems reviewed and are negative.    Physical Exam Updated Vital Signs BP 133/73   Pulse (!) 49   Resp 11   Ht 6\' 8"  (2.032 m)   Wt 235 lb (106.6 kg)   SpO2 99%   BMI 25.82 kg/m   Physical Exam  Constitutional: He appears well-developed and well-nourished. No distress.  HENT:  Head: Normocephalic and atraumatic.  Mouth/Throat: Oropharynx is clear and moist. No oropharyngeal exudate.  Eyes: Conjunctivae and EOM are normal. Pupils are equal, round, and reactive to light. Right eye exhibits no discharge. Left eye exhibits no discharge. No scleral icterus.  Neck: Normal range of motion. Neck supple. No JVD present. No thyromegaly present.  Cardiovascular: Regular rhythm, normal heart sounds and intact distal pulses.  Exam reveals no gallop and no friction rub.   No murmur heard. Bradycardia with a pulse of 50 (patient states that this is a chronic problem for him, he is not on any beta blockers or calcium channel blockers)  Pulmonary/Chest: Effort normal and breath sounds normal. No respiratory distress. He has no wheezes. He has no rales.  Abdominal: Soft. Bowel sounds are normal. He exhibits no  distension and no mass. There is no tenderness.  Musculoskeletal: Normal range of motion. He exhibits no edema or tenderness.  Lymphadenopathy:    He has no cervical adenopathy.  Neurological: He is alert. Coordination normal.  Skin: Skin is warm and dry. No rash noted. No erythema.  Psychiatric: He has a normal mood and affect. His behavior is normal.  Nursing note and vitals reviewed.    ED Treatments / Results  Labs (all labs ordered are listed, but only abnormal results are displayed) Labs Reviewed  BASIC METABOLIC PANEL - Abnormal; Notable for the following:       Result Value   Chloride 99 (*)    Glucose, Bld 135 (*)    All other components within normal limits  CBC  TROPONIN I    EKG  EKG Interpretation  Date/Time:  Saturday September 25 2015 14:04:55 EDT Ventricular Rate:  52 PR Interval:    QRS Duration: 138 QT Interval:  498 QTC Calculation: 464 R Axis:   -60 Text Interpretation:  Sinus or ectopic atrial rhythm RBBB and LAFB since last tracing no significant change Confirmed by Sabra Heck  MD, Juanluis Guastella ([id number]) on 09/25/2015 2:11:46 PM Also confirmed by Sabra Heck  MD, [place] ([id number]), editor [place] CT, Leda Gauze [phone])  on 09/25/2015 2:44:40 PM       Radiology Dg Chest 2 View  Result Date: 09/25/2015 CLINICAL DATA:  Mid sternal chest discomfort since last night, history coronary artery disease with stenting, MI, hypertension EXAM: CHEST  2 VIEW COMPARISON:  10/02/2011 FINDINGS: Normal heart size, mediastinal contours, and pulmonary vascularity. Lungs clear. No pleural effusion or pneumothorax. Bones unremarkable. IMPRESSION: No acute abnormalities. Electronically Signed   By: Lavonia Dana M.D.   On: 09/25/2015 14:42    Procedures Procedures (including critical care time)  Medications Ordered in ED Medications  aspirin chewable tablet 324 mg (243 mg Oral Given 09/25/15 1451)     Initial Impression / Assessment and Plan / ED Course  I have reviewed the triage vital signs and  the nursing notes.  Pertinent labs & imaging results that were available during my care of the patient were reviewed by me and considered in my medical decision making (see chart for details).  Clinical Course  Comment By Time  The pt has had ongoing pain free  since arrival - labs reviewed - normal renal function, blood coutns and troponin - high risk for recurrent obstructive disease - likely needs r/o.   Noemi Chapel, MD 09/02 [phone]  Work up discussed with Dr. Denton Brick - will admit for observation. Noemi Chapel, MD 09/02 [phone]    The patient has significant risk for recurrent cardiac disease. He is finally symptom-free though this has been waxing and waning throughout the day. The patient will need to be admitted to the hospital for cardiac evaluation and at least a rule out. His EKG shows what it as shown in the past with right bundle branch block and a left anterior fascicular block. This is essentially unchanged.  Full complement of aspirin has been given , Bradycardia seems to be chronic  Final diagnoses:  Chest pain, unspecified chest pain type    New Prescriptions New Prescriptions   No medications on file     Noemi Chapel, MD 09/25/15 1610

## 2015-09-25 NOTE — ED Triage Notes (Signed)
Patient states that he has felt a "funny feeling" to his left chest since last night. Concerned because he has had 6 stents in the past

## 2015-09-26 ENCOUNTER — Other Ambulatory Visit: Payer: Self-pay | Admitting: Physician Assistant

## 2015-09-26 ENCOUNTER — Observation Stay (HOSPITAL_COMMUNITY): Payer: Medicare Other

## 2015-09-26 ENCOUNTER — Observation Stay (HOSPITAL_BASED_OUTPATIENT_CLINIC_OR_DEPARTMENT_OTHER): Payer: Medicare Other

## 2015-09-26 ENCOUNTER — Encounter (HOSPITAL_COMMUNITY): Payer: Self-pay | Admitting: Physician Assistant

## 2015-09-26 DIAGNOSIS — R0789 Other chest pain: Secondary | ICD-10-CM | POA: Diagnosis not present

## 2015-09-26 DIAGNOSIS — Z7984 Long term (current) use of oral hypoglycemic drugs: Secondary | ICD-10-CM | POA: Diagnosis not present

## 2015-09-26 DIAGNOSIS — E782 Mixed hyperlipidemia: Secondary | ICD-10-CM | POA: Diagnosis not present

## 2015-09-26 DIAGNOSIS — R079 Chest pain, unspecified: Secondary | ICD-10-CM

## 2015-09-26 DIAGNOSIS — Z955 Presence of coronary angioplasty implant and graft: Secondary | ICD-10-CM | POA: Diagnosis not present

## 2015-09-26 DIAGNOSIS — I452 Bifascicular block: Secondary | ICD-10-CM | POA: Diagnosis not present

## 2015-09-26 DIAGNOSIS — E039 Hypothyroidism, unspecified: Secondary | ICD-10-CM | POA: Diagnosis not present

## 2015-09-26 DIAGNOSIS — Z9884 Bariatric surgery status: Secondary | ICD-10-CM | POA: Diagnosis not present

## 2015-09-26 DIAGNOSIS — E119 Type 2 diabetes mellitus without complications: Secondary | ICD-10-CM | POA: Diagnosis not present

## 2015-09-26 DIAGNOSIS — I453 Trifascicular block: Secondary | ICD-10-CM

## 2015-09-26 DIAGNOSIS — I110 Hypertensive heart disease with heart failure: Secondary | ICD-10-CM | POA: Diagnosis not present

## 2015-09-26 DIAGNOSIS — R072 Precordial pain: Secondary | ICD-10-CM

## 2015-09-26 DIAGNOSIS — I25709 Atherosclerosis of coronary artery bypass graft(s), unspecified, with unspecified angina pectoris: Secondary | ICD-10-CM

## 2015-09-26 DIAGNOSIS — R001 Bradycardia, unspecified: Secondary | ICD-10-CM | POA: Diagnosis not present

## 2015-09-26 DIAGNOSIS — Z79899 Other long term (current) drug therapy: Secondary | ICD-10-CM | POA: Diagnosis not present

## 2015-09-26 DIAGNOSIS — I5032 Chronic diastolic (congestive) heart failure: Secondary | ICD-10-CM | POA: Diagnosis not present

## 2015-09-26 DIAGNOSIS — I2510 Atherosclerotic heart disease of native coronary artery without angina pectoris: Secondary | ICD-10-CM | POA: Diagnosis not present

## 2015-09-26 LAB — NM MYOCAR MULTI W/SPECT W/WALL MOTION / EF
Peak HR: 85 {beats}/min
Rest HR: 53 {beats}/min

## 2015-09-26 LAB — BASIC METABOLIC PANEL
Anion gap: 8 (ref 5–15)
BUN: 9 mg/dL (ref 6–20)
CO2: 27 mmol/L (ref 22–32)
Calcium: 8.9 mg/dL (ref 8.9–10.3)
Chloride: 102 mmol/L (ref 101–111)
Creatinine, Ser: 0.86 mg/dL (ref 0.61–1.24)
GFR calc Af Amer: >60 mL/min (ref >60)
GFR calc non Af Amer: >60 mL/min (ref >60)
Glucose, Bld: 155 mg/dL — ABNORMAL HIGH (ref 65–99)
Potassium: 3.8 mmol/L (ref 3.5–5.1)
Sodium: 137 mmol/L (ref 135–145)

## 2015-09-26 LAB — TROPONIN I
Troponin I: <0.03 ng/mL (ref <0.03)
Troponin I: <0.03 ng/mL (ref <0.03)

## 2015-09-26 LAB — GLUCOSE, CAPILLARY
Glucose-Capillary: 155 mg/dL — ABNORMAL HIGH (ref 65–99)
Glucose-Capillary: 167 mg/dL — ABNORMAL HIGH (ref 65–99)
Glucose-Capillary: 265 mg/dL — ABNORMAL HIGH (ref 65–99)

## 2015-09-26 LAB — HEMOGLOBIN A1C
Hgb A1c MFr Bld: 6.5 % — ABNORMAL HIGH (ref 4.8–5.6)
Mean Plasma Glucose: 140 mg/dL

## 2015-09-26 MED ORDER — REGADENOSON 0.4 MG/5ML IV SOLN
0.4000 mg | Freq: Once | INTRAVENOUS | Status: AC
  Administered 2015-09-26: 0.4 mg via INTRAVENOUS
  Filled 2015-09-26: qty 5

## 2015-09-26 MED ORDER — TECHNETIUM TC 99M TETROFOSMIN IV KIT
10.0000 | PACK | Freq: Once | INTRAVENOUS | Status: AC | PRN
  Administered 2015-09-26: 10 via INTRAVENOUS

## 2015-09-26 MED ORDER — TECHNETIUM TC 99M TETROFOSMIN IV KIT
30.0000 | PACK | Freq: Once | INTRAVENOUS | Status: AC | PRN
  Administered 2015-09-26: 30 via INTRAVENOUS

## 2015-09-26 MED ORDER — REGADENOSON 0.4 MG/5ML IV SOLN
INTRAVENOUS | Status: AC
Start: 2015-09-26 — End: 2015-09-26
  Administered 2015-09-26: 13:00:00
  Filled 2015-09-26: qty 5

## 2015-09-26 NOTE — Progress Notes (Signed)
Pt discharged in stable condition with wife. No further questions, pt assessment unchanged

## 2015-09-26 NOTE — Progress Notes (Signed)
   The patient presented for a Lexiscan cardiolite today.  No immediate complications.  Stress imaging is pending at this time.  Charlie Pitter, PA-C 09/26/2015, 12:38 PM

## 2015-09-26 NOTE — Consult Note (Signed)
Cardiology Consultation Note    Patient ID: [name], MRN: [id number], DOB/AGE: [birth year] 65 y.o. Admit date: 09/25/2015   Date of Consult: 09/26/2015 Primary Physician: Irven Shelling, MD Primary Cardiologist: Marlou Porch  Chief Complaint: CP Reason for Consultation: CP Requesting MD: Tyrell Antonio  HPI: [name] is a 65 y.o. male with history of CAD (remote stenting to ramus, prox LAD x2), chronic diastolic CHF, DM, trifasicular block (1st degree AVB, RBBB, LAFB), sinus bradycardia, GERD, morbid obesity s/p gastric bypass 2012 (weight loss allowed him to come off some of his meds), hyperlipidemia, hypothyroidism, sleep apnea who presented to [facility] for eval of chest pain. Per Dr. Marlou Porch had fixed inferior defect in 2012 on nuc (not available in EPIC). He also has chronic history of SB/bifascicular block with HR in the 30s at time of gastric surgery, and baseline HR 40s at times without indication for PPM yet.  2D echo 09/2014: mild LVH, grade 22, mod AV thickening (sclerosis), mild AI, mild MR, mild TR.  He presented to [facility] with chest pain. Says since Friday he has intermittent twinges in his chest. Last 10-25 minutes and resolve. No pain or pressure (Had chest pressure in 2006 before his stents). No other associated symptoms. Twinges are getting less frequent but still having once in a while. No relation to exertion. Recently not very active due to healing Charcot joint on R foot.   Trop neg x 4, BNP neg. Tele with SB during sleeping hours as previously known.   ECG with chronic RBBB and trifascicular block. No pauses.   Previously worse CPAP but OSA resolved with 120 pound weight loss after gastric bypass.    Past Medical History:  Diagnosis Date  . Coronary artery disease    a. remote stenting to ramus, prox LAD x2.  . Dental crowns present   . Diabetes mellitus    diet-controlled  . First degree AV block   . GERD (gastroesophageal reflux disease)   . Heart attack ([place]) 06/2004  . Heart  murmur    aortic  . Hx MRSA infection 02/2006  . Hyperlipidemia   . Hypertension    hx. of - has not been on med. since losing wt. after gastric bypass  . Hypothyroidism   . Morbid obesity ([place])   . Mucoid cyst of joint 09/2011   left thumb  . Sleep apnea    sleep study 03/29/2011; no CPAP use  . Trifascicular block       Surgical History:  Past Surgical History:  Procedure Laterality Date  . CATARACT EXTRACTION  02/2010; 03/2010  . COLONOSCOPY WITH PROPOFOL N/A 09/14/2014   Procedure: COLONOSCOPY WITH PROPOFOL;  Surgeon: Garlan Fair, MD;  [facility];  Service: Endoscopy;  Laterality: N/A;  . CORONARY ANGIOPLASTY WITH STENT PLACEMENT  07/12/2004; 08/03/2004   total of 3 stents  . I&D EXTREMITY Right 06/04/2014   Procedure: IRRIGATION AND DEBRIDEMENT EXTREMITY;  Surgeon: [name], MD;  Location: [place];  Service: Orthopedics;  Laterality: Right;  . MASS EXCISION  10/04/2011   Procedure: EXCISION MASS;  Surgeon: Wynonia Sours, MD;  Location: [place];  Service: Orthopedics;  Laterality: Left;  excision cyst debridment ip joint of left thumb  . PROSTATECTOMY    . ROUX-EN-Y GASTRIC BYPASS  10/10/2010   laparoscopic  . TOE AMPUTATION  02/23/2006   left foot second ray amputation     Home Meds: Prior to Admission medications   Medication Sig Start Date End Date  Taking? Authorizing Provider  aspirin 81 MG tablet Take 81 mg by mouth daily.     Yes Historical Provider, MD  atorvastatin (LIPITOR) 10 MG tablet Take 10 mg by mouth daily at 6 PM.    Yes Historical Provider, MD  calcium citrate-vitamin D 500-400 MG-UNIT chewable tablet Chew 1 tablet by mouth 3 (three) times daily.   Yes Historical Provider, MD  Cyanocobalamin (B-12) 500 MCG SUBL Place 1,000 mcg under the tongue 3 (three) times a week. Take Monday Wednesday Friday   Yes Historical Provider, MD  fluticasone (FLONASE) 50 MCG/ACT nasal spray Place 2 sprays into both nostrils daily as needed for  allergies.    Yes Historical Provider, MD  glucosamine-chondroitin 500-400 MG tablet Take 1 tablet by mouth 2 (two) times daily.   Yes Historical Provider, MD  Iron Combinations (IRON COMPLEX PO) Take 65 mg by mouth daily.    Yes Historical Provider, MD  levothyroxine (SYNTHROID, LEVOTHROID) 200 MCG tablet Take 200 mcg by mouth daily.  03/07/10  Yes Historical Provider, MD  metFORMIN (GLUCOPHAGE) 500 MG tablet Take 500 mg by mouth 2 (two) times daily with a meal.   Yes Historical Provider, MD  Multiple Vitamins-Minerals (MULTIVITAMIN PO) Take 1 tablet by mouth 2 (two) times daily. Chewable tablet   Yes Historical Provider, MD  Multiple Vitamins-Minerals (OSTEO COMPLEX PO) Take 1 tablet by mouth 2 (two) times daily. SHAKLEE OSTEO MATRIX 03/07/10  Yes Historical Provider, MD  nitroGLYCERIN (NITROSTAT) 0.4 MG SL tablet Place 1 tablet (0.4 mg total) under the tongue every 5 (five) minutes x 3 doses as needed for chest pain. 11/26/14  Yes Jerline Pain, MD  OMEGA 3 1000 MG CAPS Take 1 capsule by mouth daily.    Yes Historical Provider, MD  Omeprazole 20 MG TBEC Take 20 mg by mouth every other day. 03/07/10  Yes Historical Provider, MD  Probiotic Product (PROBIOTIC FORMULA PO) Take 1 tablet by mouth daily.    Yes Historical Provider, MD  SILDENAFIL CITRATE PO Take 3-5 tablets by mouth daily as needed (erectile dysfuntion).    Yes Historical Provider, MD  sodium chloride (OCEAN) 0.65 % SOLN nasal spray Place 1 spray into both nostrils as needed for congestion.   Yes Historical Provider, MD  Wheat Dextrin (BENEFIBER PO) Take 1 scoop by mouth daily as needed (for constipation).    Yes Historical Provider, MD  zaleplon (SONATA) 10 MG capsule Take 10 mg by mouth at bedtime as needed for sleep.  03/07/10  Yes Historical Provider, MD  polyethylene glycol-electrolytes (NULYTELY/GOLYTELY) 420 G solution TAKE AS DIRECTED SEE SHEET FOR INSTRUCTIONS 06/03/14   Historical Provider, MD    Inpatient Medications:  .  acidophilus  1 capsule Oral Daily  . aspirin  81 mg Oral Daily  . atorvastatin  10 mg Oral q1800  . calcium-vitamin D  1 tablet Oral TID WC  . enoxaparin (LOVENOX) injection  40 mg Subcutaneous Q24H  . insulin aspart  0-5 Units Subcutaneous QHS  . insulin aspart  0-9 Units Subcutaneous TID WC  . levothyroxine  200 mcg Oral QAC breakfast  . omega-3 acid ethyl esters  1 g Oral Daily  . pantoprazole  40 mg Oral BID      Allergies:  Allergies  Allergen Reactions  . Quinolones Itching  . Bactrim [Sulfamethoxazole-Trimethoprim] Itching and Rash  . Moxifloxacin Rash  . Penicillin G Rash  . Penicillins Rash    Social History   Social History  . Marital status: Married  Spouse name: N/A  . Number of children: N/A  . Years of education: N/A   Occupational History  . CPA taxes    Social History Main Topics  . Smoking status: Never Smoker  . Smokeless tobacco: Never Used  . Alcohol use No  . Drug use: No  . Sexual activity: Not on file   Other Topics Concern  . Not on file   Social History Narrative   Parents and sister all using CPAP     Family History  Problem Relation Age of Onset  . Heart disease Mother      Review of Systems General: negative for chills, fever, night sweats or weight changes.  Cardiovascular: negative for chest pain, + chest twinges. Negative for edema, orthopnea, palpitations, paroxysmal nocturnal dyspnea, shortness of breath or dyspnea on exertion Dermatological: negative for rash + non-healing wound R foot  Respiratory: negative for cough or wheezing Urologic: negative for hematuria Abdominal: negative for nausea, vomiting, diarrhea, bright red blood per rectum, melena, or hematemesis Neurologic: negative for visual changes, syncope, or dizziness All other systems reviewed and are otherwise negative except as noted above. + arthitis  Labs:  Recent Labs  09/25/15 1409 09/25/15 2035 09/26/15 0212 09/26/15 0710  TROPONINI <0.03 <0.03  <0.03 <0.03   Lab Results  Component Value Date   WBC 9.1 09/25/2015   HGB 15.9 09/25/2015   HCT 45.2 09/25/2015   MCV 89.7 09/25/2015   PLT 215 09/25/2015    Recent Labs Lab 09/26/15 0710  NA 137  K 3.8  CL 102  CO2 27  BUN 9  CREATININE 0.86  CALCIUM 8.9  GLUCOSE 155*   No results found for: CHOL, HDL, LDLCALC, TRIG No results found for: DDIMER  Radiology/Studies:  Dg Chest 2 View  Result Date: 09/25/2015 CLINICAL DATA:  Mid sternal chest discomfort since last night, history coronary artery disease with stenting, MI, hypertension EXAM: CHEST  2 VIEW COMPARISON:  10/02/2011 FINDINGS: Normal heart size, mediastinal contours, and pulmonary vascularity. Lungs clear. No pleural effusion or pneumothorax. Bones unremarkable. IMPRESSION: No acute abnormalities. Electronically Signed   By: Lavonia Dana M.D.   On: 09/25/2015 14:42    Wt Readings from Last 3 Encounters:  09/26/15 234 lb 12.8 oz (106.5 kg)  08/05/15 242 lb 6.4 oz (110 kg)  09/14/14 237 lb 6 oz (107.7 kg)    EKG: NSR 1st degree AVB, RBBB, LAFB  Physical Exam: Blood pressure 109/62, pulse (!) 51, temperature 98 F (36.7 C), resp. rate 16, height 6\' 8"  (2.032 m), weight 234 lb 12.8 oz (106.5 kg), SpO2 99 %. Body mass index is 25.79 kg/m. General: Well developed, well nourished, in no acute distress. Head: Normocephalic, atraumatic, sclera non-icteric, no xanthomas, nares are without discharge.  Neck: Negative for carotid bruits. JVD not elevated. Lungs: Clear bilaterally to auscultation without wheezes, rales, or rhonchi. Breathing is unlabored. Heart: RRR with S1 S2. Occasional ectopy  No murmurs, rubs, or gallops appreciated. Abdomen: Soft, non-tender, non-distended with normoactive bowel sounds. No hepatomegaly. No rebound/guarding. No obvious abdominal masses. Msk:  Strength and tone appear normal for age. Extremities: No clubbing or cyanosis. No edema.  Distal pedal pulses are 2+ and equal bilaterally. R foot  with dressing Neuro: Alert and oriented X 3. No facial asymmetry. No focal deficit. Moves all extremities spontaneously. Psych:  Responds to questions appropriately with a normal affect.     Assessment and Plan   1. Chest pain/twinges 2. CAD s/p stenting x3 in 2006     --  negative Myoview 2012 3. Obesity s/p gastric bypass 4. Asymptomatic bradycardia with RBBB with trifascicular block 5. Charcot foot  6. DM2  Signed, Charlie Pitter PA-C 09/26/2015, 9:39 AM Pager: [phone]  Patient seen and examined with Melina Copa, PA-C. We discussed all aspects of the encounter. I agree with the assessment and plan as stated above.   Patient with known CAD and DM2 with previous stents over 10 years ago. Now with intermittent episodes of atypical chest discomfort. ECG and troponins negative for ischemia.  Given known CAD and DM2 may have atypical symptoms. Unable to walk easily with charcot foot. Proceed with lexicsan Myoview for further risk stratification. Treat with ASA and statin. No b-blocker with trifascicualr  Block. May need PPM some time down the road (Dr. Marlou Porch has told him this as well).   Granite Godman,MD 9:53 AM

## 2015-09-26 NOTE — Progress Notes (Signed)
Nuc result reviewed with Dr. Haroldine Laws: IMPRESSION: 1. No reversible ischemia or infarction. 2. Mild global hypokinesis. No focal wall motion abnormality. 3. Left ventricular ejection fraction 46% 4. Non invasive risk stratification*: Intermediate, due to low ejection fraction.  He reviewed result - he feels EF looks better visually. He feels the patient can go home with plans for outpatient echo and follow-up appt. I have sent a message to our [facility] office's scheduler requesting these appts, and our office will call the patient with this information. Notified IM, nurse and pt.   Madysen Faircloth PA-C

## 2015-09-26 NOTE — Discharge Summary (Signed)
Physician Discharge Summary  [name] [id number] DOB: 1950/02/07 DOA: 09/25/2015  PCP: Irven Shelling, MD  Admit date: 09/25/2015 Discharge date: 09/26/2015  Admitted From: home  Disposition:  Home   Recommendations for Outpatient Follow-up:  1. Follow up with PCP in 1-2 weeks 2. Please obtain BMP/CBC in one week 3. Follow up with cardiology for ECHO    Discharge Condition: Stable.  CODE STATUS: full code.  Diet recommendation: Heart Healthy / Carb Modified    Brief/Interim Summary:  1. Chest discomfort, Hx of CAD  - Fleeting, but recurring "funny feeling" to left chest while at rest for past day, improving spontaneously  - Hx of stents to ramus and LAD x2 in 2006 - Nuc med stress test in 2012 had a fixed defect inferiorly, but inducible/reversible ischemia  - Initial EKG features bifascicular block but unchanged from priors  -No beta-blocker held d/t sinus bradycardia , troponin negative, Myoview no evidence of reversible ischemia, EF 47 %, wall motion abnormalities. Dr Haroldine Laws review Myoview, recommend out patient ECHO.   2. Bifascicular block with sinus bradycardia  - Chronic, stable, followed by cardiology in outpatient setting  - Avoiding beta-blocker or other AV nodal blockade  -outpatient follow up with cardiology   3. Chronic diastolic CHF  - Appears euvolemic on presentation  - TTE (09/24/14) with EF 0000000, grade 2 diastolic dysfunction, mild concentric hypertrophy, mild AR, mild MR, mild TR  - No beta-blocker d/t bradycardia    4. Hypothyroidism  - Appears to be stable - Continue current-dose Synthroid    5. Type II DM  - A1c 7.0% in May 2016  - Managed with metformin only since his bariatric surgery and subsequent wt loss  - resume metformin  -HBA 6.6  6. Non-healing right foot wound - Hx of Charcot foot with non-healing ulcer on right foot followed by [place] [facility]  - He had been given options that included BKA, local  surgery with estimated 50% chance of success, or live with the wound; he chose to live with it for now and continues to follow with the wound clinic  - No sign active infection    Discharge Diagnoses:  Principal Problem:   Chest pain Active Problems:   Hypothyroidism   HYPERLIPIDEMIA, MIXED   Trifascicular block   CAD (coronary artery disease) of artery bypass graft   Sinus bradycardia on ECG   Chronic diastolic CHF (congestive heart failure) (HCC)   Chest pain at rest    Discharge Instructions  Discharge Instructions    Diet - low sodium heart healthy    Complete by:  As directed   Increase activity slowly    Complete by:  As directed       Medication List    STOP taking these medications   SILDENAFIL CITRATE PO     TAKE these medications   aspirin 81 MG tablet Take 81 mg by mouth daily.   atorvastatin 10 MG tablet Commonly known as:  LIPITOR Take 10 mg by mouth daily at 6 PM.   B-12 500 MCG Subl Place 1,000 mcg under the tongue 3 (three) times a week. Take Monday Wednesday Friday   BENEFIBER PO Take 1 scoop by mouth daily as needed (for constipation).   calcium citrate-vitamin D 500-400 MG-UNIT chewable tablet Chew 1 tablet by mouth 3 (three) times daily.   fluticasone 50 MCG/ACT nasal spray Commonly known as:  FLONASE Place 2 sprays into both nostrils daily as needed for allergies.  glucosamine-chondroitin 500-400 MG tablet Take 1 tablet by mouth 2 (two) times daily.   IRON COMPLEX PO Take 65 mg by mouth daily.   levothyroxine 200 MCG tablet Commonly known as:  SYNTHROID, LEVOTHROID Take 200 mcg by mouth daily.   metFORMIN 500 MG tablet Commonly known as:  GLUCOPHAGE Take 500 mg by mouth 2 (two) times daily with a meal.   MULTIVITAMIN PO Take 1 tablet by mouth 2 (two) times daily. Chewable tablet   OSTEO COMPLEX PO Take 1 tablet by mouth 2 (two) times daily. SHAKLEE OSTEO MATRIX   nitroGLYCERIN 0.4 MG SL tablet Commonly known as:   NITROSTAT Place 1 tablet (0.4 mg total) under the tongue every 5 (five) minutes x 3 doses as needed for chest pain.   Omega 3 1000 MG Caps Take 1 capsule by mouth daily.   Omeprazole 20 MG Tbec Take 20 mg by mouth every other day.   polyethylene glycol-electrolytes 420 g solution Commonly known as:  NuLYTELY/GoLYTELY TAKE AS DIRECTED SEE SHEET FOR INSTRUCTIONS   PROBIOTIC FORMULA PO Take 1 tablet by mouth daily.   sodium chloride 0.65 % Soln nasal spray Commonly known as:  OCEAN Place 1 spray into both nostrils as needed for congestion.   zaleplon 10 MG capsule Commonly known as:  SONATA Take 10 mg by mouth at bedtime as needed for sleep.      Follow-up Information    [facility] .   Specialty:  Cardiology Why:  Office will call you to arrange follow-up heart ultrasound and appointment Contact information: [address], [place] [address] [phone]         Allergies  Allergen Reactions  . Quinolones Itching  . Bactrim [Sulfamethoxazole-Trimethoprim] Itching and Rash  . Moxifloxacin Rash  . Penicillin G Rash  . Penicillins Rash    Consultations:  Cardiology    Procedures/Studies: Dg Chest 2 View  Result Date: 09/25/2015 CLINICAL DATA:  Mid sternal chest discomfort since last night, history coronary artery disease with stenting, MI, hypertension EXAM: CHEST  2 VIEW COMPARISON:  10/02/2011 FINDINGS: Normal heart size, mediastinal contours, and pulmonary vascularity. Lungs clear. No pleural effusion or pneumothorax. Bones unremarkable. IMPRESSION: No acute abnormalities. Electronically Signed   By: Lavonia Dana M.D.   On: 09/25/2015 14:42   Nm Myocar Multi W/spect W/wall Motion / Ef  Result Date: 09/26/2015 CLINICAL DATA:  Chest pain EXAM: MYOCARDIAL IMAGING WITH SPECT (REST AND PHARMACOLOGIC-STRESS) GATED LEFT VENTRICULAR WALL MOTION STUDY LEFT VENTRICULAR EJECTION FRACTION TECHNIQUE: Standard myocardial SPECT  imaging was performed after resting intravenous injection of 10 mCi Tc-81m tetrofosmin. Subsequently, intravenous infusion of Lexiscan was performed under the supervision of the Cardiology staff. At peak effect of the drug, 30 mCi Tc-69m tetrofosmin was injected intravenously and standard myocardial SPECT imaging was performed. Quantitative gated imaging was also performed to evaluate left ventricular wall motion, and estimate left ventricular ejection fraction. COMPARISON:  None. FINDINGS: Perfusion: No decreased activity in the left ventricle on stress imaging to suggest reversible ischemia or infarction. Decreased uptake along the inferolateral wall on rest imaging is likely related to GI uptake. Wall Motion: Global hypokinesis.  Mild left ventricular dilatation. Left Ventricular Ejection Fraction: 46 % End diastolic volume 123456 ml End systolic volume 89 ml IMPRESSION: 1. No reversible ischemia or infarction. 2. Mild global hypokinesis.  No focal wall motion abnormality. 3. Left ventricular ejection fraction 46% 4. Non invasive risk stratification*: Intermediate, due to low ejection fraction. *2012 Appropriate  Use Criteria for Coronary Revascularization Focused Update: J Am Coll Cardiol. [id number]. http://content.airportbarriers.com.aspx?[email or web address] Electronically Signed   By: Julian Hy M.D.   On: 09/26/2015 14:12       Subjective: Feeling well, no chest pain   Discharge Exam: Vitals:   09/26/15 1235 09/26/15 1300  BP: 123/70   Pulse:  65  Resp: 20   Temp:  97.9 F (36.6 C)   Vitals:   09/26/15 1232 09/26/15 1233 09/26/15 1235 09/26/15 1300  BP: 131/79 138/75 123/70   Pulse:    65  Resp: 20 20 20    Temp:    97.9 F (36.6 C)  TempSrc:    Oral  SpO2:    100%  Weight:      Height:        General: Pt is alert, awake, not in acute distress Cardiovascular: RRR, S1/S2 +, no rubs, no gallops Respiratory: CTA bilaterally, no wheezing, no rhonchi Abdominal: Soft,  NT, ND, bowel sounds + Extremities: no edema, no cyanosis    The results of significant diagnostics from this hospitalization (including imaging, microbiology, ancillary and laboratory) are listed below for reference.     Microbiology: No results found for this or any previous visit (from the past 240 hour(s)).   Labs: BNP (last 3 results)  Recent Labs  09/25/15 2035  BNP 0000000   Basic Metabolic Panel:  Recent Labs Lab 09/25/15 1409 09/26/15 0710  NA 136 137  K 4.0 3.8  CL 99* 102  CO2 28 27  GLUCOSE 135* 155*  BUN 12 9  CREATININE 0.95 0.86  CALCIUM 9.3 8.9   Liver Function Tests: No results for input(s): AST, ALT, ALKPHOS, BILITOT, PROT, ALBUMIN in the last 168 hours. No results for input(s): LIPASE, AMYLASE in the last 168 hours. No results for input(s): AMMONIA in the last 168 hours. CBC:  Recent Labs Lab 09/25/15 1409  WBC 9.1  HGB 15.9  HCT 45.2  MCV 89.7  PLT 215   Cardiac Enzymes:  Recent Labs Lab 09/25/15 1409 09/25/15 2035 09/26/15 0212 09/26/15 0710  TROPONINI <0.03 <0.03 <0.03 <0.03   BNP: Invalid input(s): POCBNP CBG:  Recent Labs Lab 09/26/15 0027 09/26/15 0722 09/26/15 1346  GLUCAP 155* 167* 265*   D-Dimer No results for input(s): DDIMER in the last 72 hours. Hgb A1c  Recent Labs  09/26/15 0212  HGBA1C 6.5*   Lipid Profile No results for input(s): CHOL, HDL, LDLCALC, TRIG, CHOLHDL, LDLDIRECT in the last 72 hours. Thyroid function studies No results for input(s): TSH, T4TOTAL, T3FREE, THYROIDAB in the last 72 hours.  Invalid input(s): FREET3 Anemia work up No results for input(s): VITAMINB12, FOLATE, FERRITIN, TIBC, IRON, RETICCTPCT in the last 72 hours. Urinalysis No results found for: COLORURINE, APPEARANCEUR, LABSPEC, [place], GLUCOSEU, HGBUR, BILIRUBINUR, KETONESUR, PROTEINUR, UROBILINOGEN, NITRITE, LEUKOCYTESUR Sepsis Labs Invalid input(s): PROCALCITONIN,  WBC,  LACTICIDVEN Microbiology No results found for  this or any previous visit (from the past 240 hour(s)).   Time coordinating discharge: Over 30 minutes  SIGNED:   Elmarie Shiley, MD  [facility] 09/26/2015, 3:00 PM Pager [phone]  If 7PM-7AM, please contact night-coverage [email or web address] Password [email or web address]

## 2015-09-26 NOTE — Progress Notes (Signed)
Patient's HR frequently dipping into the high 30s (37-39) while patient is sleeping; nonsustained. Per patient report, he has had a history of bradycardia since his gastric bypass surgery. Patient is asymptomatic; HR sustaining in the 40s; sinus brady. Will continue to monitor.

## 2015-10-04 DIAGNOSIS — E1149 Type 2 diabetes mellitus with other diabetic neurological complication: Secondary | ICD-10-CM | POA: Diagnosis not present

## 2015-10-04 DIAGNOSIS — R079 Chest pain, unspecified: Secondary | ICD-10-CM | POA: Diagnosis not present

## 2015-10-04 DIAGNOSIS — E11621 Type 2 diabetes mellitus with foot ulcer: Secondary | ICD-10-CM | POA: Diagnosis not present

## 2015-10-04 DIAGNOSIS — Z7984 Long term (current) use of oral hypoglycemic drugs: Secondary | ICD-10-CM | POA: Diagnosis not present

## 2015-10-04 DIAGNOSIS — Z23 Encounter for immunization: Secondary | ICD-10-CM | POA: Diagnosis not present

## 2015-10-04 DIAGNOSIS — K219 Gastro-esophageal reflux disease without esophagitis: Secondary | ICD-10-CM | POA: Diagnosis not present

## 2015-10-04 DIAGNOSIS — I10 Essential (primary) hypertension: Secondary | ICD-10-CM | POA: Diagnosis not present

## 2015-10-04 DIAGNOSIS — L97509 Non-pressure chronic ulcer of other part of unspecified foot with unspecified severity: Secondary | ICD-10-CM | POA: Diagnosis not present

## 2015-10-05 DIAGNOSIS — L97511 Non-pressure chronic ulcer of other part of right foot limited to breakdown of skin: Secondary | ICD-10-CM | POA: Diagnosis not present

## 2015-10-05 DIAGNOSIS — E11621 Type 2 diabetes mellitus with foot ulcer: Secondary | ICD-10-CM | POA: Diagnosis not present

## 2015-10-12 ENCOUNTER — Ambulatory Visit (HOSPITAL_COMMUNITY): Payer: Medicare Other | Attending: Cardiology

## 2015-10-12 ENCOUNTER — Telehealth: Payer: Self-pay | Admitting: Cardiology

## 2015-10-12 ENCOUNTER — Other Ambulatory Visit: Payer: Self-pay

## 2015-10-12 DIAGNOSIS — E785 Hyperlipidemia, unspecified: Secondary | ICD-10-CM | POA: Insufficient documentation

## 2015-10-12 DIAGNOSIS — I340 Nonrheumatic mitral (valve) insufficiency: Secondary | ICD-10-CM | POA: Insufficient documentation

## 2015-10-12 DIAGNOSIS — R1011 Right upper quadrant pain: Secondary | ICD-10-CM | POA: Insufficient documentation

## 2015-10-12 DIAGNOSIS — I351 Nonrheumatic aortic (valve) insufficiency: Secondary | ICD-10-CM | POA: Insufficient documentation

## 2015-10-12 DIAGNOSIS — I2510 Atherosclerotic heart disease of native coronary artery without angina pectoris: Secondary | ICD-10-CM | POA: Insufficient documentation

## 2015-10-12 DIAGNOSIS — R079 Chest pain, unspecified: Secondary | ICD-10-CM

## 2015-10-12 NOTE — Progress Notes (Signed)
Cardiology Office Note    Date:  10/19/2015   ID:  Connelly Tristan, DOB 1950/08/28, MRN [id number]  PCP:  Irven Shelling, MD  Cardiologist:  Dr. Marlou Porch  CC: post hospital follow up.   History of Present Illness:  [name] is a 65 y.o. male with a history of CAD (remote stenting to ramus, prox LAD x2), chronic diastolic CHF, DM, trifasicular block (1st degree AVB, RBBB, LAFB), sinus bradycardia, GERD, morbid obesity s/p gastric bypass 2012 (weight loss allowed him to come off some of his meds), hyperlipidemia, hypothyroidism, sleep apnea who presents to clinic for post hospital follow up after a recent admission for chest pain.   He was last seen by his primary cardiologist, Dr. Marlou Porch in 07/2015. Per Dr. Marlou Porch had fixed inferior defect in 2012 on nuc (not available in EPIC). He also has chronic history of SB/bifascicular block with HR in the 30s at time of gastric surgery, and baseline HR 40s at times without indication for PPM yet.  2D echo 09/2014: mild LVH, grade 22, mod AV thickening (sclerosis), mild AI, mild MR, mild TR.  He was recently admitted to [facility] 9/3-09/27/15. He presented to [facility] with chest pain. He reported intermittent twinges in his chest that lasted 10-25 minutes and resolved. No pain or pressure (Had chest pressure in 2006 before his stents). No other associated symptoms. No relation to exertion. Recently not very active due to healing Charcot joint on R foot.   He ruled out for MI and referred for [facility] which showed no reversible ischemia or infarction but EF 46% with mild global HK. Myoview reviewed by Dr. Haroldine Laws who felt EF looked better visually and cleared for discharge with outpatient echo and follow up.   Outpatient 2D ECHO on  10/12/15 showed EF is better than what the nuclear stress test has reported - is 99991111, grade 2 diastolic dysfunction with elevated LV filling pressure; biatrial enlargement; mild RVE; calcificed aortic valve with mild AS and mild AI; mild  MR.  Today he presents to clinic for follow up. He has been feeling much better with no real chest pain. No problems with breathing. He exercising as much as he can with his Charot foot. He does use a recumbent bike from time to time with no chest pain or pressure. No LE edema, orthopnea or PND. No dizziness or syncope. No claudication symptoms. HE was taken off Vigra in the hospital but not on any nitrates so don't know why he can't use this.    Past Medical History:  Diagnosis Date  . Coronary artery disease    a. remote stenting to ramus, prox LAD x2.  . Dental crowns present   . Diabetes mellitus    diet-controlled  . First degree AV block   . GERD (gastroesophageal reflux disease)   . Heart attack ([place]) 06/2004  . Heart murmur    aortic  . Hx MRSA infection 02/2006  . Hyperlipidemia   . Hypertension    hx. of - has not been on med. since losing wt. after gastric bypass  . Hypothyroidism   . Morbid obesity ([place])   . Mucoid cyst of joint 09/2011   left thumb  . Sleep apnea    sleep study 03/29/2011; no CPAP use  . Trifascicular block     Past Surgical History:  Procedure Laterality Date  . CATARACT EXTRACTION  02/2010; 03/2010  . COLONOSCOPY WITH PROPOFOL N/A 09/14/2014   Procedure: COLONOSCOPY WITH PROPOFOL;  Surgeon: Garlan Fair,  MD;  Location: [facility];  Service: Endoscopy;  Laterality: N/A;  . CORONARY ANGIOPLASTY WITH STENT PLACEMENT  07/12/2004; 08/03/2004   total of 3 stents  . I&D EXTREMITY Right 06/04/2014   Procedure: IRRIGATION AND DEBRIDEMENT EXTREMITY;  Surgeon: [name], MD;  Location: [place];  Service: Orthopedics;  Laterality: Right;  . MASS EXCISION  10/04/2011   Procedure: EXCISION MASS;  Surgeon: Wynonia Sours, MD;  Location: [place];  Service: Orthopedics;  Laterality: Left;  excision cyst debridment ip joint of left thumb  . PROSTATECTOMY    . ROUX-EN-Y GASTRIC BYPASS  10/10/2010   laparoscopic  . TOE AMPUTATION  02/23/2006     left foot second ray amputation    Current Medications: Outpatient Medications Prior to Visit  Medication Sig Dispense Refill  . aspirin 81 MG tablet Take 81 mg by mouth daily.      Marland Kitchen atorvastatin (LIPITOR) 10 MG tablet Take 10 mg by mouth daily at 6 PM.     . calcium citrate-vitamin D 500-400 MG-UNIT chewable tablet Chew 1 tablet by mouth 3 (three) times daily.    . Cyanocobalamin (B-12) 500 MCG SUBL Place 1,000 mcg under the tongue 3 (three) times a week. Take Monday Wednesday Friday    . fluticasone (FLONASE) 50 MCG/ACT nasal spray Place 2 sprays into both nostrils daily as needed for allergies.     Marland Kitchen glucosamine-chondroitin 500-400 MG tablet Take 1 tablet by mouth 2 (two) times daily.    . Iron Combinations (IRON COMPLEX PO) Take 65 mg by mouth daily.     Marland Kitchen levothyroxine (SYNTHROID, LEVOTHROID) 200 MCG tablet Take 200 mcg by mouth daily.     . metFORMIN (GLUCOPHAGE) 500 MG tablet Take 500 mg by mouth 2 (two) times daily with a meal.    . Multiple Vitamins-Minerals (MULTIVITAMIN PO) Take 1 tablet by mouth 2 (two) times daily. Chewable tablet    . Multiple Vitamins-Minerals (OSTEO COMPLEX PO) Take 1 tablet by mouth 2 (two) times daily. SHAKLEE OSTEO MATRIX    . nitroGLYCERIN (NITROSTAT) 0.4 MG SL tablet Place 1 tablet (0.4 mg total) under the tongue every 5 (five) minutes x 3 doses as needed for chest pain. 25 tablet 5  . OMEGA 3 1000 MG CAPS Take 1 capsule by mouth daily.     . Omeprazole 20 MG TBEC Take 20 mg by mouth every other day.    . Probiotic Product (PROBIOTIC FORMULA PO) Take 1 tablet by mouth daily.     . sodium chloride (OCEAN) 0.65 % SOLN nasal spray Place 1 spray into both nostrils as needed for congestion.    . Wheat Dextrin (BENEFIBER PO) Take 1 scoop by mouth daily as needed (for constipation).     . zaleplon (SONATA) 10 MG capsule Take 10 mg by mouth at bedtime as needed for sleep.     . polyethylene glycol-electrolytes (NULYTELY/GOLYTELY) 420 G solution TAKE AS  DIRECTED SEE SHEET FOR INSTRUCTIONS  0   No facility-administered medications prior to visit.      Allergies:   Quinolones; Bactrim [sulfamethoxazole-trimethoprim]; Moxifloxacin; Penicillin g; and Penicillins   Social History   Social History  . Marital status: Married    Spouse name: N/A  . Number of children: N/A  . Years of education: N/A   Occupational History  . CPA taxes    Social History Main Topics  . Smoking status: Never Smoker  . Smokeless tobacco: Never Used  . Alcohol use No  .  Drug use: No  . Sexual activity: Not Asked   Other Topics Concern  . None   Social History Narrative   Parents and sister all using CPAP     Family History:  The patient's family history includes Heart disease in his mother.     ROS:   Please see the history of present illness.    ROS All other systems reviewed and are negative.   PHYSICAL EXAM:   VS:  BP 140/64   Pulse 76   Ht 6\' 8"  (2.032 m)    GEN: Well nourished, well developed, in no acute distress  HEENT: normal  Neck: no JVD, carotid bruits, or masses Cardiac: RRR; no murmurs, rubs, or gallops,no edema  Respiratory:  clear to auscultation bilaterally, normal work of breathing GI: soft, nontender, nondistended, + BS MS: no deformity or atrophy  Skin: warm and dry, no rash Neuro:  Alert and Oriented x 3, Strength and sensation are intact Psych: euthymic mood, full affect  Wt Readings from Last 3 Encounters:  09/26/15 234 lb 12.8 oz (106.5 kg)  08/05/15 242 lb 6.4 oz (110 kg)  09/14/14 237 lb 6 oz (107.7 kg)      Studies/Labs Reviewed:   EKG:  EKG is NOT ordered today.  Recent Labs: 09/25/2015: B Natriuretic Peptide 52.5; Hemoglobin 15.9; Platelets 215 09/26/2015: BUN 9; Creatinine, Ser 0.86; Potassium 3.8; Sodium 137   Lipid Panel No results found for: CHOL, TRIG, HDL, CHOLHDL, VLDL, LDLCALC, LDLDIRECT  Additional studies/ records that were reviewed today include:   Myoview 09/26/15 IMPRESSION: 1. No  reversible ischemia or infarction. 2. Mild global hypokinesis. No focal wall motion abnormality. 3. Left ventricular ejection fraction 46% 4. Non invasive risk stratification*: Intermediate, due to low ejection fraction.  2D ECHO: 10/12/2015 LV EF: 50% -   55% Study Conclusions - Left ventricle: The cavity size was normal. Wall thickness was   increased in a pattern of mild LVH. Systolic function was normal.   The estimated ejection fraction was in the range of 50% to 55%.   Wall motion was normal; there were no regional wall motion   abnormalities. Features are consistent with a pseudonormal left   ventricular filling pattern, with concomitant abnormal relaxation   and increased filling pressure (grade 2 diastolic dysfunction).   Doppler parameters are consistent with high ventricular filling   pressure. - Aortic valve: Valve mobility was restricted. There was mild   stenosis. There was mild regurgitation. Valve area (VTI): 2.01   cm^2. Valve area (Vmax): 1.92 cm^2. Valve area (Vmean): 2.02   cm^2. - Aortic root: The aortic root was mildly dilated. - Mitral valve: Calcified annulus. There was mild regurgitation. - Left atrium: The atrium was moderately dilated. - Right ventricle: The cavity size was mildly dilated. - Right atrium: The atrium was mildly dilated. Impressions - Low normal LV systolic function; grade 2 diastolic dysfunction   with elevated LV filling pressure; biatrial enlargement; mild   RVE; calcificed aortic valve with mild AS and mild AI; mild MR.   ASSESSMENT & PLAN:   Chest pain/twinges: recent myoview with no ischemia but low EF at 43%, 2D ECHO showed low normal EF and G2DD.   Diastolic dysfunction with no clinical CHF: he will call us if he ever has unexplained weight gain or swelling. Weights have been stable. Euvolemic off diuretic therapy for now.   CAD: s/p stenting x3 in 2006: continue asa, statin. No BB due to baseline bradycardia.   Obesity:  s/p  gastric bypass. Maintaining a good body weight   Asymptomatic bradycardia with RBBB with trifascicular block: HR 70s today. No indication for PPM yet. Followed closely by Dr. Marlou Porch  Charcot foot: continue follow up with wound care.   DM2: HgA1c 6.5. Continue current reigmen    Medication Adjustments/Labs and Tests Ordered: Current medicines are reviewed at length with the patient today.  Concerns regarding medicines are outlined above.  Medication changes, Labs and Tests ordered today are listed in the Patient Instructions below. Patient Instructions  Medication Instructions:  Your physician recommends that you continue on your current medications as directed. Please refer to the Current Medication list given to you today.   Labwork: None ordered  Testing/Procedures: None ordered  Follow-Up: Your physician wants you to follow-up in: 4-6 [place] will receive a reminder letter in the mail two months in advance. If you don't receive a letter, please call our office to schedule the follow-up appointment.   Any Other Special Instructions Will Be Listed Below (If Applicable).     If you need a refill on your cardiac medications before your next appointment, please call your pharmacy.      Signed, Angelena Form, PA-C  10/19/2015 10:27 AM    [facility] [address], [place], [place]  [address] Phone: [phone]; Fax: [phone]

## 2015-10-12 NOTE — Telephone Encounter (Signed)
Pt has been made aware of his echo results and he verbalized understanding.

## 2015-10-12 NOTE — Telephone Encounter (Signed)
Follow Up:; ° ° °Returning your call. °

## 2015-10-12 NOTE — Telephone Encounter (Signed)
-----   Message from Charlie Pitter, [place] sent at 10/12/2015 11:32 AM EDT ----- Please call patient. 2D echo showed EF is better than what the nuclear stress test has reported - is 50-55%. He does have evidence of diastolic dysfunction (as known before) so please make sure to track daily weights, review sodium/2L fluid restriction with him. He also has mild valve disease but nothing we need to anything about right now. F/u as planned later this month. Dayna Dunn PA-C

## 2015-10-19 ENCOUNTER — Ambulatory Visit (INDEPENDENT_AMBULATORY_CARE_PROVIDER_SITE_OTHER): Payer: Medicare Other | Admitting: Physician Assistant

## 2015-10-19 ENCOUNTER — Encounter: Payer: Self-pay | Admitting: Physician Assistant

## 2015-10-19 VITALS — BP 140/64 | HR 76 | Ht >= 80 in

## 2015-10-19 DIAGNOSIS — E118 Type 2 diabetes mellitus with unspecified complications: Secondary | ICD-10-CM

## 2015-10-19 DIAGNOSIS — I452 Bifascicular block: Secondary | ICD-10-CM | POA: Diagnosis not present

## 2015-10-19 DIAGNOSIS — I2510 Atherosclerotic heart disease of native coronary artery without angina pectoris: Secondary | ICD-10-CM

## 2015-10-19 DIAGNOSIS — I519 Heart disease, unspecified: Secondary | ICD-10-CM

## 2015-10-19 DIAGNOSIS — I5189 Other ill-defined heart diseases: Secondary | ICD-10-CM

## 2015-10-19 DIAGNOSIS — E782 Mixed hyperlipidemia: Secondary | ICD-10-CM

## 2015-10-19 NOTE — Patient Instructions (Addendum)
Medication Instructions:  Your physician recommends that you continue on your current medications as directed. Please refer to the Current Medication list given to you today.   Labwork: None ordered  Testing/Procedures: None ordered  Follow-Up: Your physician wants you to follow-up in: 4-6 [place] will receive a reminder letter in the mail two months in advance. If you don't receive a letter, please call our office to schedule the follow-up appointment.   Any Other Special Instructions Will Be Listed Below (If Applicable).     If you need a refill on your cardiac medications before your next appointment, please call your pharmacy.

## 2015-11-15 DIAGNOSIS — E11621 Type 2 diabetes mellitus with foot ulcer: Secondary | ICD-10-CM | POA: Diagnosis not present

## 2015-11-15 DIAGNOSIS — L97511 Non-pressure chronic ulcer of other part of right foot limited to breakdown of skin: Secondary | ICD-10-CM | POA: Diagnosis not present

## 2015-12-10 DIAGNOSIS — M65321 Trigger finger, right index finger: Secondary | ICD-10-CM | POA: Diagnosis not present

## 2015-12-10 DIAGNOSIS — M25531 Pain in right wrist: Secondary | ICD-10-CM | POA: Diagnosis not present

## 2015-12-15 DIAGNOSIS — Z881 Allergy status to other antibiotic agents status: Secondary | ICD-10-CM | POA: Diagnosis not present

## 2015-12-15 DIAGNOSIS — H04123 Dry eye syndrome of bilateral lacrimal glands: Secondary | ICD-10-CM | POA: Diagnosis not present

## 2015-12-15 DIAGNOSIS — Z961 Presence of intraocular lens: Secondary | ICD-10-CM | POA: Diagnosis not present

## 2015-12-15 DIAGNOSIS — I10 Essential (primary) hypertension: Secondary | ICD-10-CM | POA: Diagnosis not present

## 2015-12-15 DIAGNOSIS — Z7982 Long term (current) use of aspirin: Secondary | ICD-10-CM | POA: Diagnosis not present

## 2015-12-15 DIAGNOSIS — Z9841 Cataract extraction status, right eye: Secondary | ICD-10-CM | POA: Diagnosis not present

## 2015-12-15 DIAGNOSIS — Z9849 Cataract extraction status, unspecified eye: Secondary | ICD-10-CM | POA: Diagnosis not present

## 2015-12-15 DIAGNOSIS — Z880 Allergy status to penicillin: Secondary | ICD-10-CM | POA: Diagnosis not present

## 2015-12-15 DIAGNOSIS — Z79899 Other long term (current) drug therapy: Secondary | ICD-10-CM | POA: Diagnosis not present

## 2015-12-15 DIAGNOSIS — Z882 Allergy status to sulfonamides status: Secondary | ICD-10-CM | POA: Diagnosis not present

## 2015-12-15 DIAGNOSIS — H02834 Dermatochalasis of left upper eyelid: Secondary | ICD-10-CM | POA: Diagnosis not present

## 2015-12-15 DIAGNOSIS — I2510 Atherosclerotic heart disease of native coronary artery without angina pectoris: Secondary | ICD-10-CM | POA: Diagnosis not present

## 2015-12-15 DIAGNOSIS — H17813 Minor opacity of cornea, bilateral: Secondary | ICD-10-CM | POA: Diagnosis not present

## 2015-12-15 DIAGNOSIS — Z9842 Cataract extraction status, left eye: Secondary | ICD-10-CM | POA: Diagnosis not present

## 2015-12-15 DIAGNOSIS — E1136 Type 2 diabetes mellitus with diabetic cataract: Secondary | ICD-10-CM | POA: Diagnosis not present

## 2015-12-15 DIAGNOSIS — H26493 Other secondary cataract, bilateral: Secondary | ICD-10-CM | POA: Diagnosis not present

## 2015-12-15 DIAGNOSIS — H02831 Dermatochalasis of right upper eyelid: Secondary | ICD-10-CM | POA: Diagnosis not present

## 2015-12-15 DIAGNOSIS — Z7984 Long term (current) use of oral hypoglycemic drugs: Secondary | ICD-10-CM | POA: Diagnosis not present

## 2015-12-27 DIAGNOSIS — E11621 Type 2 diabetes mellitus with foot ulcer: Secondary | ICD-10-CM | POA: Diagnosis not present

## 2015-12-27 DIAGNOSIS — L97411 Non-pressure chronic ulcer of right heel and midfoot limited to breakdown of skin: Secondary | ICD-10-CM | POA: Diagnosis not present

## 2015-12-27 DIAGNOSIS — L84 Corns and callosities: Secondary | ICD-10-CM | POA: Diagnosis not present

## 2016-01-10 DIAGNOSIS — M25531 Pain in right wrist: Secondary | ICD-10-CM | POA: Diagnosis not present

## 2016-01-10 DIAGNOSIS — M65321 Trigger finger, right index finger: Secondary | ICD-10-CM | POA: Diagnosis not present

## 2016-01-20 DIAGNOSIS — Z9884 Bariatric surgery status: Secondary | ICD-10-CM | POA: Diagnosis not present

## 2016-01-24 HISTORY — PX: OTHER SURGICAL HISTORY: SHX169

## 2016-02-01 DIAGNOSIS — K219 Gastro-esophageal reflux disease without esophagitis: Secondary | ICD-10-CM | POA: Diagnosis not present

## 2016-02-01 DIAGNOSIS — Z125 Encounter for screening for malignant neoplasm of prostate: Secondary | ICD-10-CM | POA: Diagnosis not present

## 2016-02-01 DIAGNOSIS — E119 Type 2 diabetes mellitus without complications: Secondary | ICD-10-CM | POA: Diagnosis not present

## 2016-02-01 DIAGNOSIS — Z0000 Encounter for general adult medical examination without abnormal findings: Secondary | ICD-10-CM | POA: Diagnosis not present

## 2016-02-01 DIAGNOSIS — Z1389 Encounter for screening for other disorder: Secondary | ICD-10-CM | POA: Diagnosis not present

## 2016-02-14 DIAGNOSIS — E11621 Type 2 diabetes mellitus with foot ulcer: Secondary | ICD-10-CM | POA: Diagnosis not present

## 2016-02-14 DIAGNOSIS — L97411 Non-pressure chronic ulcer of right heel and midfoot limited to breakdown of skin: Secondary | ICD-10-CM | POA: Diagnosis not present

## 2016-03-13 DIAGNOSIS — M25522 Pain in left elbow: Secondary | ICD-10-CM | POA: Diagnosis not present

## 2016-03-13 DIAGNOSIS — M65321 Trigger finger, right index finger: Secondary | ICD-10-CM | POA: Diagnosis not present

## 2016-03-13 DIAGNOSIS — M7712 Lateral epicondylitis, left elbow: Secondary | ICD-10-CM | POA: Diagnosis not present

## 2016-03-15 DIAGNOSIS — M25522 Pain in left elbow: Secondary | ICD-10-CM | POA: Diagnosis not present

## 2016-03-15 DIAGNOSIS — M7712 Lateral epicondylitis, left elbow: Secondary | ICD-10-CM | POA: Diagnosis not present

## 2016-03-22 DIAGNOSIS — M25522 Pain in left elbow: Secondary | ICD-10-CM | POA: Diagnosis not present

## 2016-03-22 DIAGNOSIS — M7702 Medial epicondylitis, left elbow: Secondary | ICD-10-CM | POA: Diagnosis not present

## 2016-03-22 DIAGNOSIS — M7712 Lateral epicondylitis, left elbow: Secondary | ICD-10-CM | POA: Diagnosis not present

## 2016-03-30 DIAGNOSIS — M7702 Medial epicondylitis, left elbow: Secondary | ICD-10-CM | POA: Diagnosis not present

## 2016-03-30 DIAGNOSIS — M25522 Pain in left elbow: Secondary | ICD-10-CM | POA: Diagnosis not present

## 2016-04-06 DIAGNOSIS — M25522 Pain in left elbow: Secondary | ICD-10-CM | POA: Diagnosis not present

## 2016-04-07 DIAGNOSIS — J069 Acute upper respiratory infection, unspecified: Secondary | ICD-10-CM | POA: Diagnosis not present

## 2016-04-10 DIAGNOSIS — E11621 Type 2 diabetes mellitus with foot ulcer: Secondary | ICD-10-CM | POA: Diagnosis not present

## 2016-04-10 DIAGNOSIS — L84 Corns and callosities: Secondary | ICD-10-CM | POA: Diagnosis not present

## 2016-04-10 DIAGNOSIS — L97411 Non-pressure chronic ulcer of right heel and midfoot limited to breakdown of skin: Secondary | ICD-10-CM | POA: Diagnosis not present

## 2016-04-13 DIAGNOSIS — M7702 Medial epicondylitis, left elbow: Secondary | ICD-10-CM | POA: Diagnosis not present

## 2016-04-16 ENCOUNTER — Encounter (HOSPITAL_COMMUNITY): Payer: Self-pay | Admitting: Emergency Medicine

## 2016-04-16 ENCOUNTER — Inpatient Hospital Stay (HOSPITAL_COMMUNITY)
Admission: EM | Admit: 2016-04-16 | Discharge: 2016-04-20 | DRG: 617 | Disposition: A | Discharge status: Home / Self Care | Payer: Medicare Other | Attending: Nephrology | Admitting: Nephrology

## 2016-04-16 ENCOUNTER — Emergency Department (HOSPITAL_COMMUNITY): Payer: Medicare Other

## 2016-04-16 DIAGNOSIS — I998 Other disorder of circulatory system: Secondary | ICD-10-CM | POA: Diagnosis not present

## 2016-04-16 DIAGNOSIS — Z880 Allergy status to penicillin: Secondary | ICD-10-CM

## 2016-04-16 DIAGNOSIS — Z7984 Long term (current) use of oral hypoglycemic drugs: Secondary | ICD-10-CM

## 2016-04-16 DIAGNOSIS — Z882 Allergy status to sulfonamides status: Secondary | ICD-10-CM | POA: Diagnosis not present

## 2016-04-16 DIAGNOSIS — M86672 Other chronic osteomyelitis, left ankle and foot: Secondary | ICD-10-CM | POA: Diagnosis not present

## 2016-04-16 DIAGNOSIS — Z955 Presence of coronary angioplasty implant and graft: Secondary | ICD-10-CM

## 2016-04-16 DIAGNOSIS — L03116 Cellulitis of left lower limb: Secondary | ICD-10-CM | POA: Diagnosis not present

## 2016-04-16 DIAGNOSIS — E032 Hypothyroidism due to medicaments and other exogenous substances: Secondary | ICD-10-CM | POA: Diagnosis not present

## 2016-04-16 DIAGNOSIS — L97519 Non-pressure chronic ulcer of other part of right foot with unspecified severity: Secondary | ICD-10-CM | POA: Diagnosis present

## 2016-04-16 DIAGNOSIS — I5032 Chronic diastolic (congestive) heart failure: Secondary | ICD-10-CM | POA: Diagnosis not present

## 2016-04-16 DIAGNOSIS — E1159 Type 2 diabetes mellitus with other circulatory complications: Secondary | ICD-10-CM | POA: Diagnosis present

## 2016-04-16 DIAGNOSIS — Z66 Do not resuscitate: Secondary | ICD-10-CM | POA: Diagnosis present

## 2016-04-16 DIAGNOSIS — I440 Atrioventricular block, first degree: Secondary | ICD-10-CM | POA: Diagnosis present

## 2016-04-16 DIAGNOSIS — E11621 Type 2 diabetes mellitus with foot ulcer: Secondary | ICD-10-CM | POA: Diagnosis present

## 2016-04-16 DIAGNOSIS — I110 Hypertensive heart disease with heart failure: Secondary | ICD-10-CM | POA: Diagnosis present

## 2016-04-16 DIAGNOSIS — E785 Hyperlipidemia, unspecified: Secondary | ICD-10-CM | POA: Diagnosis present

## 2016-04-16 DIAGNOSIS — Z8249 Family history of ischemic heart disease and other diseases of the circulatory system: Secondary | ICD-10-CM

## 2016-04-16 DIAGNOSIS — I25810 Atherosclerosis of coronary artery bypass graft(s) without angina pectoris: Secondary | ICD-10-CM | POA: Diagnosis present

## 2016-04-16 DIAGNOSIS — I2510 Atherosclerotic heart disease of native coronary artery without angina pectoris: Secondary | ICD-10-CM | POA: Diagnosis present

## 2016-04-16 DIAGNOSIS — Z79899 Other long term (current) drug therapy: Secondary | ICD-10-CM

## 2016-04-16 DIAGNOSIS — D509 Iron deficiency anemia, unspecified: Secondary | ICD-10-CM | POA: Diagnosis present

## 2016-04-16 DIAGNOSIS — Z888 Allergy status to other drugs, medicaments and biological substances status: Secondary | ICD-10-CM

## 2016-04-16 DIAGNOSIS — L03119 Cellulitis of unspecified part of limb: Secondary | ICD-10-CM | POA: Diagnosis not present

## 2016-04-16 DIAGNOSIS — I25700 Atherosclerosis of coronary artery bypass graft(s), unspecified, with unstable angina pectoris: Secondary | ICD-10-CM | POA: Diagnosis not present

## 2016-04-16 DIAGNOSIS — R079 Chest pain, unspecified: Secondary | ICD-10-CM | POA: Diagnosis not present

## 2016-04-16 DIAGNOSIS — L03032 Cellulitis of left toe: Secondary | ICD-10-CM

## 2016-04-16 DIAGNOSIS — K219 Gastro-esophageal reflux disease without esophagitis: Secondary | ICD-10-CM | POA: Diagnosis present

## 2016-04-16 DIAGNOSIS — Z7982 Long term (current) use of aspirin: Secondary | ICD-10-CM

## 2016-04-16 DIAGNOSIS — L97529 Non-pressure chronic ulcer of other part of left foot with unspecified severity: Secondary | ICD-10-CM | POA: Diagnosis present

## 2016-04-16 DIAGNOSIS — E1140 Type 2 diabetes mellitus with diabetic neuropathy, unspecified: Secondary | ICD-10-CM | POA: Diagnosis present

## 2016-04-16 DIAGNOSIS — M869 Osteomyelitis, unspecified: Secondary | ICD-10-CM

## 2016-04-16 DIAGNOSIS — R001 Bradycardia, unspecified: Secondary | ICD-10-CM | POA: Diagnosis not present

## 2016-04-16 DIAGNOSIS — G4730 Sleep apnea, unspecified: Secondary | ICD-10-CM | POA: Diagnosis present

## 2016-04-16 DIAGNOSIS — E11610 Type 2 diabetes mellitus with diabetic neuropathic arthropathy: Secondary | ICD-10-CM | POA: Diagnosis present

## 2016-04-16 DIAGNOSIS — L0390 Cellulitis, unspecified: Secondary | ICD-10-CM

## 2016-04-16 DIAGNOSIS — E039 Hypothyroidism, unspecified: Secondary | ICD-10-CM | POA: Diagnosis present

## 2016-04-16 DIAGNOSIS — M2012 Hallux valgus (acquired), left foot: Secondary | ICD-10-CM | POA: Diagnosis not present

## 2016-04-16 DIAGNOSIS — M868X7 Other osteomyelitis, ankle and foot: Secondary | ICD-10-CM | POA: Diagnosis present

## 2016-04-16 DIAGNOSIS — I501 Left ventricular failure: Secondary | ICD-10-CM | POA: Diagnosis not present

## 2016-04-16 DIAGNOSIS — E1169 Type 2 diabetes mellitus with other specified complication: Principal | ICD-10-CM | POA: Diagnosis present

## 2016-04-16 LAB — COMPREHENSIVE METABOLIC PANEL
ALT: 12 U/L — ABNORMAL LOW (ref 17–63)
AST: 19 U/L (ref 15–41)
Albumin: 4 g/dL (ref 3.5–5.0)
Alkaline Phosphatase: 86 U/L (ref 38–126)
Anion gap: 12 (ref 5–15)
BUN: 11 mg/dL (ref 6–20)
CO2: 26 mmol/L (ref 22–32)
Calcium: 9.6 mg/dL (ref 8.9–10.3)
Chloride: 100 mmol/L — ABNORMAL LOW (ref 101–111)
Creatinine, Ser: 0.93 mg/dL (ref 0.61–1.24)
GFR calc Af Amer: >60 mL/min (ref >60)
GFR calc non Af Amer: >60 mL/min (ref >60)
Glucose, Bld: 130 mg/dL — ABNORMAL HIGH (ref 65–99)
Potassium: 3.8 mmol/L (ref 3.5–5.1)
Sodium: 138 mmol/L (ref 135–145)
Total Bilirubin: 1 mg/dL (ref 0.3–1.2)
Total Protein: 7.9 g/dL (ref 6.5–8.1)

## 2016-04-16 LAB — CBC WITH DIFFERENTIAL/PLATELET
Basophils Absolute: 0 10*3/uL (ref 0.0–0.1)
Basophils Relative: 0 %
Eosinophils Absolute: 0.1 10*3/uL (ref 0.0–0.7)
Eosinophils Relative: 1 %
HCT: 44.7 % (ref 39.0–52.0)
Hemoglobin: 15.3 g/dL (ref 13.0–17.0)
Lymphocytes Relative: 15 %
Lymphs Abs: 1.5 10*3/uL (ref 0.7–4.0)
MCH: 30.8 pg (ref 26.0–34.0)
MCHC: 34.2 g/dL (ref 30.0–36.0)
MCV: 89.9 fL (ref 78.0–100.0)
Monocytes Absolute: 0.8 10*3/uL (ref 0.1–1.0)
Monocytes Relative: 7 %
Neutro Abs: 7.8 10*3/uL — ABNORMAL HIGH (ref 1.7–7.7)
Neutrophils Relative %: 77 %
Platelets: 313 10*3/uL (ref 150–400)
RBC: 4.97 MIL/uL (ref 4.22–5.81)
RDW: 13.8 % (ref 11.5–15.5)
WBC: 10.2 10*3/uL (ref 4.0–10.5)

## 2016-04-16 LAB — GLUCOSE, CAPILLARY: Glucose-Capillary: 131 mg/dL — ABNORMAL HIGH (ref 65–99)

## 2016-04-16 MED ORDER — NITROGLYCERIN 0.4 MG SL SUBL: 0.4000 mg | SUBLINGUAL_TABLET | SUBLINGUAL | Status: DC | PRN

## 2016-04-16 MED ORDER — ASPIRIN EC 81 MG PO TBEC
81.0000 mg | DELAYED_RELEASE_TABLET | Freq: Every day | ORAL | Status: DC
  Administered 2016-04-17 – 2016-04-20 (×3): 81 mg via ORAL
  Filled 2016-04-16 (×4): qty 1

## 2016-04-16 MED ORDER — OCUVITE-LUTEIN PO CAPS
1.0000 | ORAL_CAPSULE | Freq: Two times a day (BID) | ORAL | Status: DC
  Filled 2016-04-16: qty 1

## 2016-04-16 MED ORDER — LEVOTHYROXINE SODIUM 100 MCG PO TABS
200.0000 ug | ORAL_TABLET | Freq: Every day | ORAL | Status: DC
  Administered 2016-04-17 – 2016-04-20 (×4): 200 ug via ORAL
  Filled 2016-04-16 (×4): qty 2

## 2016-04-16 MED ORDER — FLUTICASONE PROPIONATE 50 MCG/ACT NA SUSP
2.0000 | Freq: Every day | NASAL | Status: DC | PRN
Start: 2016-04-16 — End: 2016-04-20
  Filled 2016-04-16: qty 16

## 2016-04-16 MED ORDER — PROSIGHT PO TABS
1.0000 | ORAL_TABLET | Freq: Two times a day (BID) | ORAL | Status: DC
  Administered 2016-04-17 – 2016-04-20 (×6): 1 via ORAL
  Filled 2016-04-16 (×8): qty 1

## 2016-04-16 MED ORDER — B-12 500 MCG SL SUBL: 500.0000 ug | SUBLINGUAL_TABLET | SUBLINGUAL | Status: DC

## 2016-04-16 MED ORDER — VANCOMYCIN HCL 10 G IV SOLR
1250.0000 mg | Freq: Two times a day (BID) | INTRAVENOUS | Status: DC
  Administered 2016-04-17 – 2016-04-20 (×6): 1250 mg via INTRAVENOUS
  Filled 2016-04-16 (×8): qty 1250

## 2016-04-16 MED ORDER — ATORVASTATIN CALCIUM 10 MG PO TABS
10.0000 mg | ORAL_TABLET | Freq: Every day | ORAL | Status: DC
  Administered 2016-04-16 – 2016-04-18 (×3): 10 mg via ORAL
  Filled 2016-04-16 (×3): qty 1

## 2016-04-16 MED ORDER — ONDANSETRON HCL 4 MG/2ML IJ SOLN: 4.0000 mg | Freq: Four times a day (QID) | INTRAMUSCULAR | Status: DC | PRN

## 2016-04-16 MED ORDER — VANCOMYCIN HCL IN DEXTROSE 1-5 GM/200ML-% IV SOLN
1000.0000 mg | Freq: Once | INTRAVENOUS | Status: AC
  Administered 2016-04-16: 1000 mg via INTRAVENOUS
  Filled 2016-04-16: qty 200

## 2016-04-16 MED ORDER — PANTOPRAZOLE SODIUM 40 MG PO TBEC
40.0000 mg | DELAYED_RELEASE_TABLET | Freq: Every day | ORAL | Status: DC
Start: 2016-04-17 — End: 2016-04-20
  Administered 2016-04-17 – 2016-04-20 (×3): 40 mg via ORAL
  Filled 2016-04-16 (×4): qty 1

## 2016-04-16 MED ORDER — INSULIN ASPART 100 UNIT/ML ~~LOC~~ SOLN
0.0000 [IU] | Freq: Three times a day (TID) | SUBCUTANEOUS | Status: DC
  Administered 2016-04-17: 1 [IU] via SUBCUTANEOUS
  Administered 2016-04-17: 2 [IU] via SUBCUTANEOUS
  Administered 2016-04-18: 1 [IU] via SUBCUTANEOUS
  Administered 2016-04-18: 2 [IU] via SUBCUTANEOUS
  Administered 2016-04-19 (×2): 1 [IU] via SUBCUTANEOUS
  Administered 2016-04-20: 2 [IU] via SUBCUTANEOUS
  Administered 2016-04-20: 1 [IU] via SUBCUTANEOUS

## 2016-04-16 MED ORDER — DEXTROSE 5 % IV SOLN
1.0000 g | Freq: Two times a day (BID) | INTRAVENOUS | Status: DC
  Administered 2016-04-17 – 2016-04-20 (×5): 1 g via INTRAVENOUS
  Filled 2016-04-16 (×8): qty 1

## 2016-04-16 MED ORDER — FERROUS SULFATE 325 (65 FE) MG PO TABS
325.0000 mg | ORAL_TABLET | Freq: Every day | ORAL | Status: DC
  Administered 2016-04-17 – 2016-04-20 (×3): 325 mg via ORAL
  Filled 2016-04-16 (×4): qty 1

## 2016-04-16 MED ORDER — ONDANSETRON HCL 4 MG PO TABS: 4.0000 mg | ORAL_TABLET | Freq: Four times a day (QID) | ORAL | Status: DC | PRN

## 2016-04-16 MED ORDER — ACETAMINOPHEN 325 MG PO TABS: 650.0000 mg | ORAL_TABLET | Freq: Four times a day (QID) | ORAL | Status: DC | PRN

## 2016-04-16 MED ORDER — ACETAMINOPHEN 650 MG RE SUPP: 650.0000 mg | Freq: Four times a day (QID) | RECTAL | Status: DC | PRN

## 2016-04-16 MED ORDER — ZOLPIDEM TARTRATE 5 MG PO TABS: 5.0000 mg | ORAL_TABLET | Freq: Every evening | ORAL | Status: DC | PRN

## 2016-04-16 MED ORDER — CEFAZOLIN IN D5W 1 GM/50ML IV SOLN
1.0000 g | Freq: Once | INTRAVENOUS | Status: AC
  Administered 2016-04-16: 1 g via INTRAVENOUS
  Filled 2016-04-16: qty 50

## 2016-04-16 NOTE — ED Notes (Signed)
Patient transported to X-ray 

## 2016-04-16 NOTE — Progress Notes (Signed)
Pharmacy Antibiotic Note  [name] is a 66 y.o. male admitted on 04/16/2016 with cellulitis.  Pharmacy has been consulted for Vancomycin  dosing.  Vancomycin dose given in the ED at 1830 pm  Plan: Vancomycin 1250 mg iv Q 12 hours Follow up progress, Scr, cultures  Height: 6\' 8"  (203.2 cm) Weight: 224 lb 1.6 oz (101.7 kg) IBW/kg (Calculated) : 96  Temp (24hrs), Avg:98.1 F (36.7 C), Min:97.9 F (36.6 C), Max:98.3 F (36.8 C)   Recent Labs Lab 04/16/16 1650  WBC 10.2  CREATININE 0.93    Estimated Creatinine Clearance: 104.7 mL/min (by C-G formula based on SCr of 0.93 mg/dL).    Allergies  Allergen Reactions  . Quinolones Itching  . Bactrim [Sulfamethoxazole-Trimethoprim] Itching and Rash  . Moxifloxacin Rash  . Penicillins Rash    Reaction at 66 years old Has patient had a PCN reaction causing immediate rash, facial/tongue/throat swelling, SOB or lightheadedness with hypotension: Yes Has patient had a PCN reaction causing severe rash involving mucus membranes or skin necrosis: No Has patient had a PCN reaction that required hospitalization unknown Has patient had a PCN reaction occurring within the last 10 years: No If all of the above answers are "NO", then may proceed with Cephalosporin use.     Thank you for allowing pharmacy to be a part of this patient's care.  Tad Moore 04/16/2016 10:12 PM

## 2016-04-16 NOTE — ED Provider Notes (Signed)
[facility] DEPT Provider Note   CSN: [id number] Arrival date & time: 04/16/16  1604     History   Chief Complaint Chief Complaint  Patient presents with  . Joint Swelling  . Wound Infection  . Foot Swelling    HPI [name] is a 66 y.o. male.Presents with third toe of left foot swollen and red one week ago. No injury. Minimal pain at his toe. He noticed the redness spread proximally up his shin earlier this morningno fever no nausea or vomiting. No other associated symptoms. Seen at walk-in clinic earlier today sent here for further evaluation.  HPI  Past Medical History:  Diagnosis Date  . Coronary artery disease    a. remote stenting to ramus, prox LAD x2.  . Dental crowns present   . Diabetes mellitus    diet-controlled  . First degree AV block   . GERD (gastroesophageal reflux disease)   . Heart attack 06/2004  . Heart murmur    aortic  . Hx MRSA infection 02/2006  . Hyperlipidemia   . Hypertension    hx. of - has not been on med. since losing wt. after gastric bypass  . Hypothyroidism   . Morbid obesity ([place])   . Mucoid cyst of joint 09/2011   left thumb  . Sleep apnea    sleep study 03/29/2011; no CPAP use  . Trifascicular block     Patient Active Problem List   Diagnosis Date Noted  . Chest pain 09/25/2015  . CAD (coronary artery disease) of artery bypass graft 09/25/2015  . Sinus bradycardia on ECG 09/25/2015  . Chronic diastolic CHF (congestive heart failure) ([place]) 09/25/2015  . Chest pain at rest 09/25/2015  . Aortic valvular disorder 08/04/2014  . Disorder of mitral valve 08/04/2014  . Diabetic foot infection ([place]) 06/03/2014  . Peripheral vascular disease ([place]) 06/03/2014  . Abscess of foot 06/03/2014  . Atherosclerosis of native coronary artery of native heart without angina pectoris 07/29/2013  . Trifascicular block 07/29/2013  . 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 Hypothyroidism 01/22/2007  . HYPERLIPIDEMIA, MIXED  01/22/2007  . MYOCARDIAL INFARCTION, HX OF 01/22/2007  . DEGENERATIVE JOINT DISEASE 01/22/2007  . ANEMIA, IRON DEFICIENCY, HX OF 01/22/2007    Past Surgical History:  Procedure Laterality Date  . CATARACT EXTRACTION  02/2010; 03/2010  . COLONOSCOPY WITH PROPOFOL N/A 09/14/2014   Procedure: COLONOSCOPY WITH PROPOFOL;  Surgeon: Garlan Fair, MD;  Location: [facility];  Service: Endoscopy;  Laterality: N/A;  . CORONARY ANGIOPLASTY WITH STENT PLACEMENT  07/12/2004; 08/03/2004   total of 3 stents  . I&D EXTREMITY Right 06/04/2014   Procedure: IRRIGATION AND DEBRIDEMENT EXTREMITY;  Surgeon: [name], MD;  Location: [facility];  Service: Orthopedics;  Laterality: Right;  . MASS EXCISION  10/04/2011   Procedure: EXCISION MASS;  Surgeon: Wynonia Sours, MD;  Location: [facility];  Service: Orthopedics;  Laterality: Left;  excision cyst debridment ip joint of left thumb  . PROSTATECTOMY    . ROUX-EN-Y GASTRIC BYPASS  10/10/2010   laparoscopic  . TOE AMPUTATION  02/23/2006   left foot second ray amputation  Reconstruction of charcort foot right  side     Home Medications    Prior to Admission medications   Medication Sig Start Date End Date Taking? Authorizing Provider  aspirin 81 MG tablet Take 81 mg by mouth daily.      Historical Provider, MD  atorvastatin (LIPITOR) 10 MG tablet Take  10 mg by mouth daily at 6 PM.     Historical Provider, MD  calcium citrate-vitamin D 500-400 MG-UNIT chewable tablet Chew 1 tablet by mouth 3 (three) times daily.    Historical Provider, MD  Cyanocobalamin (B-12) 500 MCG SUBL Place 1,000 mcg under the tongue 3 (three) times a week. Take Monday Wednesday Friday    Historical Provider, MD  fluticasone (FLONASE) 50 MCG/ACT nasal spray Place 2 sprays into both nostrils daily as needed for allergies.     Historical Provider, MD  glucosamine-chondroitin 500-400 MG tablet Take 1 tablet by mouth 2 (two) times daily.    Historical Provider, MD  Iron  Combinations (IRON COMPLEX PO) Take 65 mg by mouth daily.     Historical Provider, MD  levothyroxine (SYNTHROID, LEVOTHROID) 200 MCG tablet Take 200 mcg by mouth daily.  03/07/10   Historical Provider, MD  metFORMIN (GLUCOPHAGE) 500 MG tablet Take 500 mg by mouth 2 (two) times daily with a meal.    Historical Provider, MD  Multiple Vitamins-Minerals (MULTIVITAMIN PO) Take 1 tablet by mouth 2 (two) times daily. Chewable tablet    Historical Provider, MD  Multiple Vitamins-Minerals (OSTEO COMPLEX PO) Take 1 tablet by mouth 2 (two) times daily. SHAKLEE OSTEO MATRIX 03/07/10   Historical Provider, MD  nitroGLYCERIN (NITROSTAT) 0.4 MG SL tablet Place 1 tablet (0.4 mg total) under the tongue every 5 (five) minutes x 3 doses as needed for chest pain. 11/26/14   Jerline Pain, MD  OMEGA 3 1000 MG CAPS Take 1 capsule by mouth daily.     Historical Provider, MD  Omeprazole 20 MG TBEC Take 20 mg by mouth every other day. 03/07/10   Historical Provider, MD  Probiotic Product (PROBIOTIC FORMULA PO) Take 1 tablet by mouth daily.     Historical Provider, MD  sildenafil (REVATIO) 20 MG tablet Take 20 mg by mouth as needed (Erectile Dysfunction).    Historical Provider, MD  sodium chloride (OCEAN) 0.65 % SOLN nasal spray Place 1 spray into both nostrils as needed for congestion.    Historical Provider, MD  Wheat Dextrin (BENEFIBER PO) Take 1 scoop by mouth daily as needed (for constipation).     Historical Provider, MD  zaleplon (SONATA) 10 MG capsule Take 10 mg by mouth at bedtime as needed for sleep.  03/07/10   Historical Provider, MD    Family History Family History  Problem Relation Age of Onset  . Heart disease Mother     Social History Social History  Substance Use Topics  . Smoking status: Never Smoker  . Smokeless tobacco: Never Used  . Alcohol use No     Allergies   Quinolones; Bactrim [sulfamethoxazole-trimethoprim]; Moxifloxacin; Penicillin g; and Penicillins   Review of Systems Review of  Systems  Constitutional: Negative.   HENT: Negative.   Respiratory: Negative.   Cardiovascular: Positive for chest pain.       Syncope  Gastrointestinal: Negative.   Musculoskeletal: Negative.   Skin: Positive for wound.       Wound at left foot. Chronic wound to right foot  Allergic/Immunologic: Positive for immunocompromised state.       Diabetic  Neurological: Negative.   Psychiatric/Behavioral: Negative.   All other systems reviewed and are negative.    Physical Exam Updated Vital Signs BP (!) 145/83   Pulse 74   Temp 98.3 F (36.8 C) (Oral)   Resp 17   Ht 6\' 8"  (2.032 m)   Wt 230 lb (104.3 kg)  SpO2 100%   BMI 25.27 kg/m   Physical Exam  Constitutional: He appears well-developed and well-nourished. No distress.  HENT:  Head: Normocephalic and atraumatic.  Eyes: Conjunctivae are normal. Pupils are equal, round, and reactive to light.  Neck: Neck supple. No tracheal deviation present. No thyromegaly present.  Cardiovascular: Normal rate and regular rhythm.   No murmur heard. Pulmonary/Chest: Effort normal and breath sounds normal.  Abdominal: Soft. Bowel sounds are normal. He exhibits no distension. There is no tenderness.  Musculoskeletal: Normal range of motion. He exhibits no edema or tenderness.  Right lower extremity multiple surgical scars of foot. Open wound of foot and plantar surface. Clean-appearing no drainage. No tenderness. Left lower extremity second toe is amputated third toe is black, necrotic appearing tip. Toe is red and shiny and warm. There is a red streak at the dorsal foot and distal two thirds of the shin. No inguinal nodes.  Neurological: He is alert. Coordination normal.  Skin: Skin is warm and dry. No rash noted.  Psychiatric: He has a normal mood and affect.  Nursing note and vitals reviewed.    ED Treatments / Results  Labs (all labs ordered are listed, but only abnormal results are displayed) Labs Reviewed  COMPREHENSIVE METABOLIC  PANEL  CBC WITH DIFFERENTIAL/PLATELET    EKG  EKG Interpretation None       Radiology No results found.  Procedures Procedures (including critical care time)  Medications Ordered in ED Medications  vancomycin (VANCOCIN) IVPB 1000 mg/200 mL premix (not administered)  ceFAZolin (ANCEF) IVPB 1 g/50 mL premix (not administered)   Results for orders placed or performed during the hospital encounter of 04/16/16  Comprehensive metabolic panel  Result Value Ref Range   Sodium 138 135 - 145 mmol/L   Potassium 3.8 3.5 - 5.1 mmol/L   Chloride 100 (L) 101 - 111 mmol/L   CO2 26 22 - 32 mmol/L   Glucose, Bld 130 (H) 65 - 99 mg/dL   BUN 11 6 - 20 mg/dL   Creatinine, Ser 0.93 0.61 - 1.24 mg/dL   Calcium 9.6 8.9 - 10.3 mg/dL   Total Protein 7.9 6.5 - 8.1 g/dL   Albumin 4.0 3.5 - 5.0 g/dL   AST 19 15 - 41 U/L   ALT 12 (L) 17 - 63 U/L   Alkaline Phosphatase 86 38 - 126 U/L   Total Bilirubin 1.0 0.3 - 1.2 mg/dL   GFR calc non Af Amer >60 >60 mL/min   GFR calc Af Amer >60 >60 mL/min   Anion gap 12 5 - 15  CBC with Differential  Result Value Ref Range   WBC 10.2 4.0 - 10.5 K/uL   RBC 4.97 4.22 - 5.81 MIL/uL   Hemoglobin 15.3 13.0 - 17.0 g/dL   HCT 44.7 39.0 - 52.0 %   MCV 89.9 78.0 - 100.0 fL   MCH 30.8 26.0 - 34.0 pg   MCHC 34.2 30.0 - 36.0 g/dL   RDW 13.8 11.5 - 15.5 %   Platelets 313 150 - 400 K/uL   Neutrophils Relative % 77 %   Neutro Abs 7.8 (H) 1.7 - 7.7 K/uL   Lymphocytes Relative 15 %   Lymphs Abs 1.5 0.7 - 4.0 K/uL   Monocytes Relative 7 %   Monocytes Absolute 0.8 0.1 - 1.0 K/uL   Eosinophils Relative 1 %   Eosinophils Absolute 0.1 0.0 - 0.7 K/uL   Basophils Relative 0 %   Basophils Absolute 0.0 0.0 - 0.1 K/uL  Dg Foot Complete Left  Result Date: 04/16/2016 CLINICAL DATA:  Swelling/redness. Blood blister on 3rd toe that popped,feeling tenderness in proximal foot EXAM: LEFT FOOT - COMPLETE 3+ VIEW COMPARISON:  MRI 03/21/2008 FINDINGS: Three views of the left foot  submitted. Again noted status post amputation of distal second metatarsal and second toe. Mild hallux valgus deformity. Mild degenerative changes first metatarsal phalangeal joint. There are extensive degenerative changes third metatarsal phalangeal joint with marginal spurring small bony erosion and significant narrowing of joint space. Findings most likely due to sequelae from osteoarthritis or prior osteomyelitis. No definite evidence of acute bone destruction or pathologic fracture. If there is high clinical suspicious for osteomyelitis further correlation with MRI is recommended. Again noted erosive and sclerotic changes at the base of second and third metatarsal probable chronic in nature. IMPRESSION: Again noted status post amputation of distal second metatarsal and second toe. Mild hallux valgus deformity. Mild degenerative changes first metatarsal phalangeal joint. There are extensive degenerative changes third metatarsal phalangeal joint with marginal spurring small bony erosion and significant narrowing of joint space. Findings most likely due to sequelae from osteoarthritis or prior osteomyelitis. No definite evidence of acute bone destruction or pathologic fracture. If there is high clinical suspicious for osteomyelitis further correlation with MRI is recommended. Electronically Signed   By: Lahoma Crocker M.D.   On: 04/16/2016 17:36    Initial Impression / Assessment and Plan / ED Course  I have reviewed the triage vital signs and the nursing notes.  Pertinent labs & imaging results that were available during my care of the patient were reviewed by me and considered in my medical decision making (see chart for details).     7 PM Patient resting comfortably after treatment with intravenous antibiotics. Dr.Kakrakandy consulted and will see patient in hospital and arrange for overnight stay Third toe of left foot appears necrotic and with soft tissue infection. Concern for osteomyelitis, cellulitis  and ascending lymphangitis Final Clinical Impressions(s) / ED Diagnoses  Cellulitis of left 3rd toe with ascending lymphangitis Final diagnoses:  None    New Prescriptions New Prescriptions   No medications on file     Orlie Dakin, MD 04/16/16 2014

## 2016-04-16 NOTE — ED Triage Notes (Signed)
[facility] sent [facility] for further eval of left toe nail coming off, left toe ischemic and left foot cellulitis.

## 2016-04-16 NOTE — H&P (Addendum)
History and Physical    [name] PYK:[id number] DOB: 04-12-1950 DOA: 04/16/2016  PCP: Irven Shelling, MD  Patient coming from: Home.  Chief Complaint: Left foot erythema.  HPI: [name] is a 66 y.o. male with history of CAD status post stenting last stress test on September 2017, history of diastolic CHF, hypothyroidism and diabetes mellitus with chronic right foot ulceration being managed at [facility] wound clinic presents to the ER because of worsening redness involving the left foot middle toe. Patient noticed the redness last week which over the last 24 hours worsened involving the foot and is streaking upwards to the leg. Has been having subjective feeling of fever and chills. Patient has neuropathy and does not feel pain.   ED Course: X-rays in the ER does not show any features of acute osteomyelitis. On exam erythema extends up from the left foot middle toe up to the anterior shin of the left leg. Patient has good pulses. Patient is being admitted for IV antibiotics and further management.  Review of Systems: As per HPI, rest all negative.   Past Medical History:  Diagnosis Date  . Coronary artery disease    a. remote stenting to ramus, prox LAD x2.  . Dental crowns present   . Diabetes mellitus    diet-controlled  . First degree AV block   . GERD (gastroesophageal reflux disease)   . Heart attack 06/2004  . Heart murmur    aortic  . Hx MRSA infection 02/2006  . Hyperlipidemia   . Hypertension    hx. of - has not been on med. since losing wt. after gastric bypass  . Hypothyroidism   . Morbid obesity ([place])   . Mucoid cyst of joint 09/2011   left thumb  . Sleep apnea    sleep study 03/29/2011; no CPAP use  . Trifascicular block     Past Surgical History:  Procedure Laterality Date  . CATARACT EXTRACTION  02/2010; 03/2010  . COLONOSCOPY WITH PROPOFOL N/A 09/14/2014   Procedure: COLONOSCOPY WITH PROPOFOL;  Surgeon: Garlan Fair, MD;  Location: [facility];   Service: Endoscopy;  Laterality: N/A;  . CORONARY ANGIOPLASTY WITH STENT PLACEMENT  07/12/2004; 08/03/2004   total of 3 stents  . I&D EXTREMITY Right 06/04/2014   Procedure: IRRIGATION AND DEBRIDEMENT EXTREMITY;  Surgeon: [name], MD;  Location: [place];  Service: Orthopedics;  Laterality: Right;  . MASS EXCISION  10/04/2011   Procedure: EXCISION MASS;  Surgeon: Wynonia Sours, MD;  Location: [place];  Service: Orthopedics;  Laterality: Left;  excision cyst debridment ip joint of left thumb  . PROSTATECTOMY    . ROUX-EN-Y GASTRIC BYPASS  10/10/2010   laparoscopic  . TOE AMPUTATION  02/23/2006   left foot second ray amputation     reports that he has never smoked. He has never used smokeless tobacco. He reports that he does not drink alcohol or use drugs.  Allergies  Allergen Reactions  . Quinolones Itching  . Bactrim [Sulfamethoxazole-Trimethoprim] Itching and Rash  . Moxifloxacin Rash  . Penicillins Rash    Reaction at 66 years old Has patient had a PCN reaction causing immediate rash, facial/tongue/throat swelling, SOB or lightheadedness with hypotension: Yes Has patient had a PCN reaction causing severe rash involving mucus membranes or skin necrosis: No Has patient had a PCN reaction that required hospitalization unknown Has patient had a PCN reaction occurring within the last 10 years: No If all of the above answers are "  NO", then may proceed with Cephalosporin use.    Family History  Problem Relation Age of Onset  . Heart disease Mother     Prior to Admission medications   Medication Sig Start Date End Date Taking? Authorizing Provider  aspirin EC 81 MG tablet Take 81 mg by mouth daily.   Yes Historical Provider, MD  atorvastatin (LIPITOR) 10 MG tablet Take 10 mg by mouth daily after supper.    Yes Historical Provider, MD  CALCIUM CITRATE PO Take 500 mg by mouth 3 (three) times daily.   Yes Historical Provider, MD  Cyanocobalamin (B-12) 500 MCG SUBL  Place 500 mcg under the tongue every Monday, Wednesday, and Friday.    Yes Historical Provider, MD  ferrous sulfate 325 (65 FE) MG tablet Take 325 mg by mouth daily with breakfast.   Yes Historical Provider, MD  fluticasone (FLONASE) 50 MCG/ACT nasal spray Place 2 sprays into both nostrils daily as needed for allergies.    Yes Historical Provider, MD  ibuprofen (ADVIL,MOTRIN) 200 MG tablet Take 400 mg by mouth every 6 (six) hours as needed for headache (pain).   Yes Historical Provider, MD  levothyroxine (SYNTHROID, LEVOTHROID) 200 MCG tablet Take 200 mcg by mouth daily before breakfast.  03/07/10  Yes Historical Provider, MD  metFORMIN (GLUCOPHAGE) 500 MG tablet Take 500 mg by mouth 2 (two) times daily with a meal.   Yes Historical Provider, MD  Multiple Vitamin (MULTIVITAMIN WITH MINERALS) TABS tablet Take 2 tablets by mouth 2 (two) times daily.   Yes Historical Provider, MD  Multiple Vitamins-Minerals (PRESERVISION AREDS 2) CAPS Take 1 capsule by mouth 2 (two) times daily.   Yes Historical Provider, MD  nitroGLYCERIN (NITROSTAT) 0.4 MG SL tablet Place 1 tablet (0.4 mg total) under the tongue every 5 (five) minutes x 3 doses as needed for chest pain. 11/26/14  Yes Jerline Pain, MD  Omega-3 Fatty Acids (FISH OIL TRIPLE STRENGTH PO) Take 1 capsule by mouth daily.   Yes Historical Provider, MD  Omeprazole 20 MG TBEC Take 20 mg by mouth daily as needed (acid reflux/ indigestion).  03/07/10  Yes Historical Provider, MD  OVER THE COUNTER MEDICATION Take 1 tablet by mouth 2 (two) times daily. [facility] Complex: glucosamine/cat's claw extract   Yes Historical Provider, MD  OVER THE COUNTER MEDICATION Take 3 tablets by mouth daily. [place] Pain Relief Complex: Safflower Extract & Boswellia   Yes Historical Provider, MD  OVER THE COUNTER MEDICATION Take 2 tablets by mouth at bedtime as needed (sleep). Remfresh - OTC sleep aid   Yes Historical Provider, MD  Probiotic Product (PROBIOTIC FORMULA PO) Take  2 tablets by mouth daily after supper.    Yes Historical Provider, MD  sildenafil (REVATIO) 20 MG tablet Take 40-100 mg by mouth daily as needed (Erectile Dysfunction).    Yes Historical Provider, MD  zaleplon (SONATA) 10 MG capsule Take 10 mg by mouth at bedtime as needed for sleep.  03/07/10  Yes Historical Provider, MD    Physical Exam: Vitals:   04/16/16 1915 04/16/16 1945 04/16/16 2022 04/16/16 2027  BP: 124/74 (!) 128/91  123/78  Pulse: 69 77  (!) 58  Resp: 13 20  20   Temp:    97.9 F (36.6 C)  TempSrc:    Oral  SpO2: 100% 100%  100%  Weight:   101.7 kg (224 lb 1.6 oz)   Height:   6\' 8"  (2.032 m)       Constitutional: Moderately built  and nourished. Vitals:   04/16/16 1915 04/16/16 1945 04/16/16 2022 04/16/16 2027  BP: 124/74 (!) 128/91  123/78  Pulse: 69 77  (!) 58  Resp: 13 20  20   Temp:    97.9 F (36.6 C)  TempSrc:    Oral  SpO2: 100% 100%  100%  Weight:   101.7 kg (224 lb 1.6 oz)   Height:   6\' 8"  (2.032 m)    Eyes: Anicteric. no pallor. ENMT: No discharge from the ears eyes nose or mouth. Neck: No mass felt. No neck rigidity. No JVD appreciated. Respiratory: No rhonchi or crepitations. Cardiovascular: S1 and S2 heard no murmurs appreciated. Abdomen: Soft nontender bowel sounds present. No guarding or rigidity. Musculoskeletal: Left foot has erythema extending from middle to upper the anterior aspect of the left leg. Skin: Erythema extending from the left middle toe to the anterior shin. Chronic ulceration of the right foot no active discharge. Neurologic: Alert awake and oriented 3. Moves all extremities. Psychiatric: Appears normal. Normal affect.   Labs on Admission: I have personally reviewed following labs and imaging studies  CBC:  Recent Labs Lab 04/16/16 1650  WBC 10.2  NEUTROABS 7.8*  HGB 15.3  HCT 44.7  MCV 89.9  PLT 299   Basic Metabolic Panel:  Recent Labs Lab 04/16/16 1650  NA 138  K 3.8  CL 100*  CO2 26  GLUCOSE 130*  BUN  11  CREATININE 0.93  CALCIUM 9.6   GFR: Estimated Creatinine Clearance: 104.7 mL/min (by C-G formula based on SCr of 0.93 mg/dL). Liver Function Tests:  Recent Labs Lab 04/16/16 1650  AST 19  ALT 12*  ALKPHOS 86  BILITOT 1.0  PROT 7.9  ALBUMIN 4.0   No results for input(s): LIPASE, AMYLASE in the last 168 hours. No results for input(s): AMMONIA in the last 168 hours. Coagulation Profile: No results for input(s): INR, PROTIME in the last 168 hours. Cardiac Enzymes: No results for input(s): CKTOTAL, CKMB, CKMBINDEX, TROPONINI in the last 168 hours. BNP (last 3 results) No results for input(s): PROBNP in the last 8760 hours. HbA1C: No results for input(s): HGBA1C in the last 72 hours. CBG:  Recent Labs Lab 04/16/16 2035  GLUCAP 131*   Lipid Profile: No results for input(s): CHOL, HDL, LDLCALC, TRIG, CHOLHDL, LDLDIRECT in the last 72 hours. Thyroid Function Tests: No results for input(s): TSH, T4TOTAL, FREET4, T3FREE, THYROIDAB in the last 72 hours. Anemia Panel: No results for input(s): VITAMINB12, FOLATE, FERRITIN, TIBC, IRON, RETICCTPCT in the last 72 hours. Urine analysis: No results found for: COLORURINE, APPEARANCEUR, LABSPEC, PHURINE, GLUCOSEU, HGBUR, BILIRUBINUR, KETONESUR, PROTEINUR, UROBILINOGEN, NITRITE, LEUKOCYTESUR Sepsis Labs: @LABRCNTIP (procalcitonin:4,lacticidven:4) )No results found for this or any previous visit (from the past 240 hour(s)).   Radiological Exams on Admission: Dg Foot Complete Left  Result Date: 04/16/2016 CLINICAL DATA:  Swelling/redness. Blood blister on 3rd toe that popped,feeling tenderness in proximal foot EXAM: LEFT FOOT - COMPLETE 3+ VIEW COMPARISON:  MRI 03/21/2008 FINDINGS: Three views of the left foot submitted. Again noted status post amputation of distal second metatarsal and second toe. Mild hallux valgus deformity. Mild degenerative changes first metatarsal phalangeal joint. There are extensive degenerative changes third  metatarsal phalangeal joint with marginal spurring small bony erosion and significant narrowing of joint space. Findings most likely due to sequelae from osteoarthritis or prior osteomyelitis. No definite evidence of acute bone destruction or pathologic fracture. If there is high clinical suspicious for osteomyelitis further correlation with MRI is recommended. Again noted erosive  and sclerotic changes at the base of second and third metatarsal probable chronic in nature. IMPRESSION: Again noted status post amputation of distal second metatarsal and second toe. Mild hallux valgus deformity. Mild degenerative changes first metatarsal phalangeal joint. There are extensive degenerative changes third metatarsal phalangeal joint with marginal spurring small bony erosion and significant narrowing of joint space. Findings most likely due to sequelae from osteoarthritis or prior osteomyelitis. No definite evidence of acute bone destruction or pathologic fracture. If there is high clinical suspicious for osteomyelitis further correlation with MRI is recommended. Electronically Signed   By: Lahoma Crocker M.D.   On: 04/16/2016 17:36     Assessment/Plan Principal Problem:   Cellulitis of left foot Active Problems:   Hypothyroidism   Type 2 diabetes mellitus with vascular disease (HCC)   CAD (coronary artery disease) of artery bypass graft   Sinus bradycardia on ECG   Chronic diastolic CHF (congestive heart failure) ([place])    1. Left foot cellulitis with chronic diabetic foot ulceration of the right foot - at this time I have ordered MRI of the left foot to rule out any osteomyelitis. Patient is placed on empiric antibiotics. Follow blood cultures. 2. Diabetes mellitus type 2 - will hold metformin while inpatient. I have placed patient on sliding scale coverage. 3. History of CAD status post stenting - last stress test in September 2017 was unremarkable. Patient denies any chest pain. Patient is on aspirin and  statins. Not on beta blockers due to bradycardia. 4. History of hypothyroidism on Synthroid. 5. Chronic diastolic CHF - appears compensated.   DVT prophylaxis: SCDs. Code Status: DO NOT RESUSCITATE. Family Communication: Discussed with patient.  Disposition Plan: Home.  Consults called: None.  Admission status: Inpatient.    Rise Patience MD [facility] Pager [phone].  If 7PM-7AM, please contact night-coverage [email or web address] Password [facility]  04/16/2016, 10:02 PM

## 2016-04-16 NOTE — Progress Notes (Signed)
Pt arrived to unit from ED in stable condition; wife present at bedside.  Ambulatory from stretcher to bed without difficulty.  Alert and oriented x4.  Oriented to room and unit with understanding verbalized.  Awaiting MD orders at this time.  Will continue to monitor.

## 2016-04-17 ENCOUNTER — Inpatient Hospital Stay (HOSPITAL_COMMUNITY): Payer: Medicare Other

## 2016-04-17 DIAGNOSIS — L03032 Cellulitis of left toe: Secondary | ICD-10-CM

## 2016-04-17 DIAGNOSIS — L03119 Cellulitis of unspecified part of limb: Secondary | ICD-10-CM

## 2016-04-17 DIAGNOSIS — M86672 Other chronic osteomyelitis, left ankle and foot: Secondary | ICD-10-CM

## 2016-04-17 DIAGNOSIS — I5032 Chronic diastolic (congestive) heart failure: Secondary | ICD-10-CM

## 2016-04-17 DIAGNOSIS — E032 Hypothyroidism due to medicaments and other exogenous substances: Secondary | ICD-10-CM

## 2016-04-17 DIAGNOSIS — I25700 Atherosclerosis of coronary artery bypass graft(s), unspecified, with unstable angina pectoris: Secondary | ICD-10-CM

## 2016-04-17 LAB — GLUCOSE, CAPILLARY
Glucose-Capillary: 191 mg/dL — ABNORMAL HIGH (ref 65–99)
Glucose-Capillary: 98 mg/dL (ref 65–99)
Glucose-Capillary: 125 mg/dL — ABNORMAL HIGH (ref 65–99)
Glucose-Capillary: 284 mg/dL — ABNORMAL HIGH (ref 65–99)

## 2016-04-17 LAB — BASIC METABOLIC PANEL
Anion gap: 10 (ref 5–15)
BUN: 10 mg/dL (ref 6–20)
CO2: 27 mmol/L (ref 22–32)
Calcium: 9 mg/dL (ref 8.9–10.3)
Chloride: 99 mmol/L — ABNORMAL LOW (ref 101–111)
Creatinine, Ser: 0.75 mg/dL (ref 0.61–1.24)
GFR calc Af Amer: >60 mL/min (ref >60)
GFR calc non Af Amer: >60 mL/min (ref >60)
Glucose, Bld: 191 mg/dL — ABNORMAL HIGH (ref 65–99)
Potassium: 3.8 mmol/L (ref 3.5–5.1)
Sodium: 136 mmol/L (ref 135–145)

## 2016-04-17 LAB — CBC
HCT: 40.5 % (ref 39.0–52.0)
Hemoglobin: 13.6 g/dL (ref 13.0–17.0)
MCH: 30.2 pg (ref 26.0–34.0)
MCHC: 33.6 g/dL (ref 30.0–36.0)
MCV: 90 fL (ref 78.0–100.0)
Platelets: 272 10*3/uL (ref 150–400)
RBC: 4.5 MIL/uL (ref 4.22–5.81)
RDW: 13.5 % (ref 11.5–15.5)
WBC: 7 10*3/uL (ref 4.0–10.5)

## 2016-04-17 LAB — TSH: TSH: 1.682 u[IU]/mL (ref 0.350–4.500)

## 2016-04-17 MED ORDER — HEPARIN SODIUM (PORCINE) 5000 UNIT/ML IJ SOLN
5000.0000 [IU] | Freq: Three times a day (TID) | INTRAMUSCULAR | Status: DC
  Administered 2016-04-17 – 2016-04-20 (×7): 5000 [IU] via SUBCUTANEOUS
  Filled 2016-04-17 (×7): qty 1

## 2016-04-17 MED ORDER — DAKINS (1/4 STRENGTH) 0.125 % EX SOLN
Freq: Two times a day (BID) | CUTANEOUS | Status: DC
  Administered 2016-04-17 – 2016-04-20 (×5)
  Filled 2016-04-17: qty 473

## 2016-04-17 NOTE — Consult Note (Signed)
[facility] Nurse wound consult note Reason for Consult: chronic non healing foot ulcer right foot.    Patient in with new concern for the left foot, orthopedics will follow this Wound type:full thickness neuropathic foot ulcer, followed by wound care center and plastic surgery at [facility].  Patient has extensive history of charcot foot repair, TCC, HBO treatments, and skin grafts to the right foot wound. Currently he is treating at home with dry dressings and follow up with plastic surgery for serial debridements for the hyperkeratotic skin  Since this patient has such a thorough explanation of his treatment and current regimen I have added orders for the current wound care for the right foot  I will ask the bedside nurse to measure the wound when the first dressing is changed later tonight.  Dressing procedure/placement/frequency: Cleanse right foot wound with 1/4% Dakin's, cover with dry gauze, ABD pad, secure with kerlix. Change daily.  Discussed POC with patient and bedside nurse.  Re consult if needed, will not follow at this time. Thanks  Samual Beals [employer], RN,CWOCN, CNS [phone])

## 2016-04-17 NOTE — Progress Notes (Signed)
PROGRESS NOTE                                                                                                                                                                                                             Patient Demographics:    [name], is a 66 y.o. male, DOB - [birth year], AVW:[id number]  Admit date - 04/16/2016   Admitting Physician Rise Patience, MD  Outpatient Primary MD for the patient is Irven Shelling, MD  LOS - 1  Chief Complaint  Patient presents with  . Joint Swelling  . Wound Infection  . Foot Swelling       Brief Narrative  [name] is a 66 y.o. male with history of CAD status post stenting last stress test on September 2017, history of diastolic CHF, hypothyroidism and diabetes mellitus with chronic right foot ulceration being managed at [facility] wound clinic presents to the ER because of worsening redness involving the left foot middle toe, Further workup showed left big toe osteomyelitis along with left foot cellulitis.   Subjective:    [name] today has, No headache, No chest pain, No abdominal pain - No Nausea, No new weakness tingling or numbness, No Cough - SOB.     Assessment  & Plan :     1.Left foot cellulitis along with left third toe osteomyelitis. Continue present IV antibiotics, monitor cultures, orthopedics consulted, will also check ABI.  2. CAD, history of chronic diastolic CHF EF 82% in mid 2017. Compensated from cardiac standpoint, no chest pain or shortness of breath, continue aspirin, statin for secondary prevention. Blood pressure and heart rate too low for beta blockers.  3. Hypothyroidism. On home dose Synthroid check TSH  4. Iron deficiency anemia. Continue oral supplementation.  5. Dyslipidemia. On statin continue.   6. GERD. On PPI.   7. DM type II. Currently on sliding scale will monitor.  CBG (last 3)   Recent Labs  04/16/16 2035  04/17/16 0809  GLUCAP 131* 191*    Lab Results  Component Value Date   HGBA1C 6.5 (H) 09/26/2015     Diet : Diet heart healthy/carb modified Room service appropriate? Yes; Fluid consistency: Thin    Family Communication  :  None  Code Status :  Full  Disposition Plan  :  TBD  Consults  :  Ortho  Procedures  :    MRI L foot - 3rd toe osteomylitis  DVT Prophylaxis  :  Heparin    Lab Results  Component Value Date   PLT 272 04/17/2016    Inpatient Medications  Scheduled Meds: . aspirin EC  81 mg Oral Daily  . atorvastatin  10 mg Oral QPC supper  . ceFEPime (MAXIPIME) IV  1 g Intravenous Q12H  . ferrous sulfate  325 mg Oral Q breakfast  . insulin aspart  0-9 Units Subcutaneous TID WC  . levothyroxine  200 mcg Oral QAC breakfast  . multivitamin  1 tablet Oral BID  . pantoprazole  40 mg Oral Daily  . vancomycin  1,250 mg Intravenous Q12H   Continuous Infusions: PRN Meds:.fluticasone, nitroGLYCERIN, ondansetron **OR** ondansetron (ZOFRAN) IV, zolpidem  Antibiotics  :    Anti-infectives    Start     Dose/Rate Route Frequency Ordered Stop   04/17/16 0600  vancomycin (VANCOCIN) 1,250 mg in sodium chloride 0.9 % 250 mL IVPB     1,250 mg 166.7 mL/hr over 90 Minutes Intravenous Every 12 hours 04/16/16 2210     04/17/16 0600  ceFEPIme (MAXIPIME) 1 g in dextrose 5 % 50 mL IVPB     1 g 100 mL/hr over 30 Minutes Intravenous Every 12 hours 04/16/16 2215     04/16/16 1715  vancomycin (VANCOCIN) IVPB 1000 mg/200 mL premix     1,000 mg 200 mL/hr over 60 Minutes Intravenous  Once 04/16/16 1709 04/16/16 1924   04/16/16 1715  ceFAZolin (ANCEF) IVPB 1 g/50 mL premix     1 g 100 mL/hr over 30 Minutes Intravenous  Once 04/16/16 1709 04/16/16 1824         Objective:   Vitals:   04/16/16 1945 04/16/16 2022 04/16/16 2027 04/17/16 0659  BP: (!) 128/91  123/78 (!) 107/58  Pulse: 77  (!) 58 (!) 48  Resp: 20  20 18   Temp:   97.9 F (36.6 C) 97.3 F (36.3 C)  TempSrc:   Oral  Oral  SpO2: 100%  100% 98%  Weight:  101.7 kg (224 lb 1.6 oz)    Height:  6\' 8"  (2.032 m)      Wt Readings from Last 3 Encounters:  04/16/16 101.7 kg (224 lb 1.6 oz)  09/26/15 106.5 kg (234 lb 12.8 oz)  08/05/15 110 kg (242 lb 6.4 oz)     Intake/Output Summary (Last 24 hours) at 04/17/16 0851 Last data filed at 04/17/16 0631  Gross per 24 hour  Intake              590 ml  Output              200 ml  Net              390 ml     Physical Exam  Awake Alert, Oriented X 3, No new F.N deficits, Normal affect [place].AT,PERRAL Supple Neck,No JVD, No cervical lymphadenopathy appriciated.  Symmetrical Chest wall movement, Good air movement bilaterally, CTAB RRR,No Gallops,Rubs or new Murmurs, No Parasternal Heave +ve B.Sounds, Abd Soft, No tenderness, No organomegaly appriciated, No rebound - guarding or rigidity. No Cyanosis, Clubbing or edema, No new Rash or bruise  ,    L 3rd toe      Data Review:    CBC  Recent Labs Lab 04/16/16 1650 04/17/16 0648  WBC 10.2 7.0  HGB 15.3 13.6  HCT 44.7 40.5  PLT 313 272  MCV 89.9  90.0  MCH 30.8 30.2  MCHC 34.2 33.6  RDW 13.8 13.5  LYMPHSABS 1.5  --   MONOABS 0.8  --   EOSABS 0.1  --   BASOSABS 0.0  --     Chemistries   Recent Labs Lab 04/16/16 1650 04/17/16 0648  NA 138 136  K 3.8 3.8  CL 100* 99*  CO2 26 27  GLUCOSE 130* 191*  BUN 11 10  CREATININE 0.93 0.75  CALCIUM 9.6 9.0  AST 19  --   ALT 12*  --   ALKPHOS 86  --   BILITOT 1.0  --    ------------------------------------------------------------------------------------------------------------------ No results for input(s): CHOL, HDL, LDLCALC, TRIG, CHOLHDL, LDLDIRECT in the last 72 hours.  Lab Results  Component Value Date   HGBA1C 6.5 (H) 09/26/2015   ------------------------------------------------------------------------------------------------------------------ No results for input(s): TSH, T4TOTAL, T3FREE, THYROIDAB in the last 72 hours.  Invalid  input(s): FREET3 ------------------------------------------------------------------------------------------------------------------ No results for input(s): VITAMINB12, FOLATE, FERRITIN, TIBC, IRON, RETICCTPCT in the last 72 hours.  Coagulation profile No results for input(s): INR, PROTIME in the last 168 hours.  No results for input(s): DDIMER in the last 72 hours.  Cardiac Enzymes No results for input(s): CKMB, TROPONINI, MYOGLOBIN in the last 168 hours.  Invalid input(s): CK ------------------------------------------------------------------------------------------------------------------    Component Value Date/Time   BNP 52.5 09/25/2015 2035    Micro Results No results found for this or any previous visit (from the past 240 hour(s)).  Radiology Reports Mr Foot Left Wo Contrast  Result Date: 04/17/2016 CLINICAL DATA:  Swelling and redness of the left third toe for 1 week in a diabetic patient. EXAM: MRI OF THE LEFT FOOT WITHOUT CONTRAST TECHNIQUE: Multiplanar, multisequence MR imaging of the plain films left foot 04/16/2016. MRI left foot 03/21/2008. Was performed. No intravenous contrast was administered. COMPARISON:  None. FINDINGS: Bones/Joint/Cartilage The patient is status post amputation at the level of the mid second metatarsal as seen on prior examinations. Marrow signal in the third toe is normal. Intense marrow edema is seen throughout the distal phalanx of the fourth toe. There appears to be an overlying skin ulceration at the tuft. Collapse of the head of the second metatarsal and osteoarthritis about the second MTP joint are again seen. The patient has a hallux valgus deformity. There is bone-on-bone first MTP joint space narrowing. Marrow edema in the head of the first metatarsal extending into the proximal diaphysis is most likely due to osteoarthritis and associated stress change. Subchondral cyst formation and osteophytosis about the joints of the midfoot are compatible  with osteoarthritis early neuropathic change. Ligaments Intact. Muscles and Tendons Intrinsic musculature the foot is atrophied. Soft tissues No abscess is identified. IMPRESSION: Intense marrow edema throughout the distal phalanx of fourth toe consistent with osteomyelitis. There appears to be a skin ulceration at the tuft. Negative for abscess or septic joint. Negative for osteomyelitis of the third toe. Hallux valgus deformity and first MTP osteoarthritis. Marrow edema about the first MTP joint extending into the distal diaphysis of the first metatarsal is likely due to stress change rather than infection. Remote AVN head of the third metatarsal with secondary osteoarthritis of the third MTP joint. Electronically Signed   By: Inge Rise M.D.   On: 04/17/2016 07:34   Dg Foot Complete Left  Result Date: 04/16/2016 CLINICAL DATA:  Swelling/redness. Blood blister on 3rd toe that popped,feeling tenderness in proximal foot EXAM: LEFT FOOT - COMPLETE 3+ VIEW COMPARISON:  MRI 03/21/2008 FINDINGS: Three views of the left  foot submitted. Again noted status post amputation of distal second metatarsal and second toe. Mild hallux valgus deformity. Mild degenerative changes first metatarsal phalangeal joint. There are extensive degenerative changes third metatarsal phalangeal joint with marginal spurring small bony erosion and significant narrowing of joint space. Findings most likely due to sequelae from osteoarthritis or prior osteomyelitis. No definite evidence of acute bone destruction or pathologic fracture. If there is high clinical suspicious for osteomyelitis further correlation with MRI is recommended. Again noted erosive and sclerotic changes at the base of second and third metatarsal probable chronic in nature. IMPRESSION: Again noted status post amputation of distal second metatarsal and second toe. Mild hallux valgus deformity. Mild degenerative changes first metatarsal phalangeal joint. There are  extensive degenerative changes third metatarsal phalangeal joint with marginal spurring small bony erosion and significant narrowing of joint space. Findings most likely due to sequelae from osteoarthritis or prior osteomyelitis. No definite evidence of acute bone destruction or pathologic fracture. If there is high clinical suspicious for osteomyelitis further correlation with MRI is recommended. Electronically Signed   By: Lahoma Crocker M.D.   On: 04/16/2016 17:36    Time Spent in minutes  30   Paisly Fingerhut K M.D on 04/17/2016 at 8:51 AM  Between 7am to 7pm - Pager - [phone] ( page via [facility], text pages only, please mention full 10 digit call back number).  After 7pm go to [email or web address] - password [facility]  [facility] -  Office  [phone]

## 2016-04-17 NOTE — Consult Note (Signed)
ORTHOPAEDIC CONSULTATION  REQUESTING PHYSICIAN: Thurnell Lose, MD  Chief Complaint: Charcot collapse right foot with chronic Wagner grade 1 ulcer osteomyelitis of the left foot fourth toe with cellulitis status post second ray amputation left foot.  HPI: [name] is a 66 y.o. male who presents with cellulitis osteomyelitis left foot fifth toe. Patient is status post a second Ray amputation left foot. Patient is currently been on IV antibiotics the cellulitis in the foot has resolved.  Past Medical History:  Diagnosis Date  . Coronary artery disease    a. remote stenting to ramus, prox LAD x2.  . Dental crowns present   . Diabetes mellitus    diet-controlled  . First degree AV block   . GERD (gastroesophageal reflux disease)   . Heart attack 06/2004  . Heart murmur    aortic  . Hx MRSA infection 02/2006  . Hyperlipidemia   . Hypertension    hx. of - has not been on med. since losing wt. after gastric bypass  . Hypothyroidism   . Morbid obesity ([place])   . Mucoid cyst of joint 09/2011   left thumb  . Sleep apnea    sleep study 03/29/2011; no CPAP use  . Trifascicular block    Past Surgical History:  Procedure Laterality Date  . CATARACT EXTRACTION  02/2010; 03/2010  . COLONOSCOPY WITH PROPOFOL N/A 09/14/2014   Procedure: COLONOSCOPY WITH PROPOFOL;  Surgeon: Garlan Fair, MD;  Location: [facility];  Service: Endoscopy;  Laterality: N/A;  . CORONARY ANGIOPLASTY WITH STENT PLACEMENT  07/12/2004; 08/03/2004   total of 3 stents  . I&D EXTREMITY Right 06/04/2014   Procedure: IRRIGATION AND DEBRIDEMENT EXTREMITY;  Surgeon: [name], MD;  Location: [facility];  Service: Orthopedics;  Laterality: Right;  . MASS EXCISION  10/04/2011   Procedure: EXCISION MASS;  Surgeon: Wynonia Sours, MD;  Location: [facility];  Service: Orthopedics;  Laterality: Left;  excision cyst debridment ip joint of left thumb  . PROSTATECTOMY    . ROUX-EN-Y GASTRIC BYPASS  10/10/2010     laparoscopic  . TOE AMPUTATION  02/23/2006   left foot second ray amputation   Social History   Social History  . Marital status: Married    Spouse name: N/A  . Number of children: N/A  . Years of education: N/A   Occupational History  . CPA taxes    Social History Main Topics  . Smoking status: Never Smoker  . Smokeless tobacco: Never Used  . Alcohol use No  . Drug use: No  . Sexual activity: Not Asked   Other Topics Concern  . None   Social History Narrative   Parents and sister all using CPAP   Family History  Problem Relation Age of Onset  . Heart disease Mother    - negative except otherwise stated in the family history section Allergies  Allergen Reactions  . Quinolones Itching  . Bactrim [Sulfamethoxazole-Trimethoprim] Itching and Rash  . Moxifloxacin Rash  . Penicillins Rash    Reaction at 66 years old Has patient had a PCN reaction causing immediate rash, facial/tongue/throat swelling, SOB or lightheadedness with hypotension: Yes Has patient had a PCN reaction causing severe rash involving mucus membranes or skin necrosis: No Has patient had a PCN reaction that required hospitalization unknown Has patient had a PCN reaction occurring within the last 10 years: No If all of the above answers are "NO", then may proceed with Cephalosporin use.   Prior  to Admission medications   Medication Sig Start Date End Date Taking? Authorizing Provider  aspirin EC 81 MG tablet Take 81 mg by mouth daily.   Yes Historical Provider, MD  atorvastatin (LIPITOR) 10 MG tablet Take 10 mg by mouth daily after supper.    Yes Historical Provider, MD  CALCIUM CITRATE PO Take 500 mg by mouth 3 (three) times daily.   Yes Historical Provider, MD  Cyanocobalamin (B-12) 500 MCG SUBL Place 500 mcg under the tongue every Monday, Wednesday, and Friday.    Yes Historical Provider, MD  ferrous sulfate 325 (65 FE) MG tablet Take 325 mg by mouth daily with breakfast.   Yes Historical Provider,  MD  fluticasone (FLONASE) 50 MCG/ACT nasal spray Place 2 sprays into both nostrils daily as needed for allergies.    Yes Historical Provider, MD  ibuprofen (ADVIL,MOTRIN) 200 MG tablet Take 400 mg by mouth every 6 (six) hours as needed for headache (pain).   Yes Historical Provider, MD  levothyroxine (SYNTHROID, LEVOTHROID) 200 MCG tablet Take 200 mcg by mouth daily before breakfast.  03/07/10  Yes Historical Provider, MD  metFORMIN (GLUCOPHAGE) 500 MG tablet Take 500 mg by mouth 2 (two) times daily with a meal.   Yes Historical Provider, MD  Multiple Vitamin (MULTIVITAMIN WITH MINERALS) TABS tablet Take 2 tablets by mouth 2 (two) times daily.   Yes Historical Provider, MD  Multiple Vitamins-Minerals (PRESERVISION AREDS 2) CAPS Take 1 capsule by mouth 2 (two) times daily.   Yes Historical Provider, MD  nitroGLYCERIN (NITROSTAT) 0.4 MG SL tablet Place 1 tablet (0.4 mg total) under the tongue every 5 (five) minutes x 3 doses as needed for chest pain. 11/26/14  Yes Jerline Pain, MD  Omega-3 Fatty Acids (FISH OIL TRIPLE STRENGTH PO) Take 1 capsule by mouth daily.   Yes Historical Provider, MD  Omeprazole 20 MG TBEC Take 20 mg by mouth daily as needed (acid reflux/ indigestion).  03/07/10  Yes Historical Provider, MD  OVER THE COUNTER MEDICATION Take 1 tablet by mouth 2 (two) times daily. [facility] Complex: glucosamine/cat's claw extract   Yes Historical Provider, MD  OVER THE COUNTER MEDICATION Take 3 tablets by mouth daily. [facility] Complex: Safflower Extract & Boswellia   Yes Historical Provider, MD  OVER THE COUNTER MEDICATION Take 2 tablets by mouth at bedtime as needed (sleep). Remfresh - OTC sleep aid   Yes Historical Provider, MD  Probiotic Product (PROBIOTIC FORMULA PO) Take 2 tablets by mouth daily after supper.    Yes Historical Provider, MD  sildenafil (REVATIO) 20 MG tablet Take 40-100 mg by mouth daily as needed (Erectile Dysfunction).    Yes Historical Provider, MD   zaleplon (SONATA) 10 MG capsule Take 10 mg by mouth at bedtime as needed for sleep.  03/07/10  Yes Historical Provider, MD   Mr Foot Left Wo Contrast  Result Date: 04/17/2016 CLINICAL DATA:  Swelling and redness of the left third toe for 1 week in a diabetic patient. EXAM: MRI OF THE LEFT FOOT WITHOUT CONTRAST TECHNIQUE: Multiplanar, multisequence MR imaging of the plain films left foot 04/16/2016. MRI left foot 03/21/2008. Was performed. No intravenous contrast was administered. COMPARISON:  None. FINDINGS: Bones/Joint/Cartilage The patient is status post amputation at the level of the mid second metatarsal as seen on prior examinations. Marrow signal in the third toe is normal. Intense marrow edema is seen throughout the distal phalanx of the fourth toe. There appears to be an overlying skin ulceration  at the tuft. Collapse of the head of the second metatarsal and osteoarthritis about the second MTP joint are again seen. The patient has a hallux valgus deformity. There is bone-on-bone first MTP joint space narrowing. Marrow edema in the head of the first metatarsal extending into the proximal diaphysis is most likely due to osteoarthritis and associated stress change. Subchondral cyst formation and osteophytosis about the joints of the midfoot are compatible with osteoarthritis early neuropathic change. Ligaments Intact. Muscles and Tendons Intrinsic musculature the foot is atrophied. Soft tissues No abscess is identified. IMPRESSION: Intense marrow edema throughout the distal phalanx of fourth toe consistent with osteomyelitis. There appears to be a skin ulceration at the tuft. Negative for abscess or septic joint. Negative for osteomyelitis of the third toe. Hallux valgus deformity and first MTP osteoarthritis. Marrow edema about the first MTP joint extending into the distal diaphysis of the first metatarsal is likely due to stress change rather than infection. Remote AVN head of the third metatarsal with  secondary osteoarthritis of the third MTP joint. Electronically Signed   By: Inge Rise M.D.   On: 04/17/2016 07:34   Dg Foot Complete Left  Result Date: 04/16/2016 CLINICAL DATA:  Swelling/redness. Blood blister on 3rd toe that popped,feeling tenderness in proximal foot EXAM: LEFT FOOT - COMPLETE 3+ VIEW COMPARISON:  MRI 03/21/2008 FINDINGS: Three views of the left foot submitted. Again noted status post amputation of distal second metatarsal and second toe. Mild hallux valgus deformity. Mild degenerative changes first metatarsal phalangeal joint. There are extensive degenerative changes third metatarsal phalangeal joint with marginal spurring small bony erosion and significant narrowing of joint space. Findings most likely due to sequelae from osteoarthritis or prior osteomyelitis. No definite evidence of acute bone destruction or pathologic fracture. If there is high clinical suspicious for osteomyelitis further correlation with MRI is recommended. Again noted erosive and sclerotic changes at the base of second and third metatarsal probable chronic in nature. IMPRESSION: Again noted status post amputation of distal second metatarsal and second toe. Mild hallux valgus deformity. Mild degenerative changes first metatarsal phalangeal joint. There are extensive degenerative changes third metatarsal phalangeal joint with marginal spurring small bony erosion and significant narrowing of joint space. Findings most likely due to sequelae from osteoarthritis or prior osteomyelitis. No definite evidence of acute bone destruction or pathologic fracture. If there is high clinical suspicious for osteomyelitis further correlation with MRI is recommended. Electronically Signed   By: Lahoma Crocker M.D.   On: 04/16/2016 17:36   - pertinent xrays, CT, MRI studies were reviewed and independently interpreted  Positive ROS: All other systems have been reviewed and were otherwise negative with the exception of those  mentioned in the HPI and as above.  Physical Exam: General: Alert, no acute distress Psychiatric: Patient is competent for consent with normal mood and affect Lymphatic: No axillary or cervical lymphadenopathy Cardiovascular: No pedal edema Respiratory: No cyanosis, no use of accessory musculature GI: No organomegaly, abdomen is soft and non-tender  Skin: Patient has cellulitis and sausage digit swelling of the left foot fourth toe. There is a plantar ulcer.   Neurologic: Patient does not have protective sensation bilateral lower extremities.   MUSCULOSKELETAL:  On examination patient has a good dorsalis pedis pulse bilaterally. He has a Hydrographic surveyor grade 1 ulcer possibly 2 cm in diameter on the right foot 5 mm deep he is status post internal fixation for Charcot collapse of the right foot. Examination the left foot he has sausage digit swelling  cellulitis the MRI scan and radiographs are reviewed and show osteomyelitis chronic of the fourth toe  Assessment: Assessment: Diabetic insensate neuropathy Charcot collapse of the right foot with Wagner grade 1 ulcer over the Charcot rocker-bottom deformity with osteomyelitis ulceration of the left foot fourth toe status post second Ray amputation of the left foot  Plan: Plan: Discussed the patient has 2 options either a fourth toe amputation left foot or a transmetatarsal amputation of the left foot. Discussed that with a transmetatarsal amputation he has less likely to require further surgery with the fourth toe amputation. Patient is at risk for further skin breakdown and possible further amputation surgery. We will plan for surgery on Wednesday patient will make his decision at that time of which surgery to proceed with.  Thank you for the consult and the opportunity to see Mr. Rema Fendt, [place] [phone] 7:42 PM

## 2016-04-17 NOTE — Progress Notes (Signed)
VASCULAR LAB PRELIMINARY  ARTERIAL  ABI completed: Normal ABIs at rest. Right TBI appeared abnormal. Left TBI  Appeared normal.     RIGHT    LEFT    PRESSURE WAVEFORM  PRESSURE WAVEFORM  BRACHIAL 121 Normal BRACHIAL 126 Normal  DP 154 Triphasic DP 184 Triphasic  PT 153 Triphasic PT 164 Triphasic  GREAT TOE 81 NA GREAT TOE 110 NA    RIGHT LEFT  ABI 1.2 1.4     Lydia Meng D, RVT 04/17/2016, 3:35 PM

## 2016-04-17 NOTE — Consult Note (Addendum)
WOC consulted for right foot wound, workup for left foot wound. Marica Otter MSN, RN, CWOCN, CNS

## 2016-04-18 ENCOUNTER — Other Ambulatory Visit (INDEPENDENT_AMBULATORY_CARE_PROVIDER_SITE_OTHER): Payer: Self-pay | Admitting: Orthopedic Surgery

## 2016-04-18 DIAGNOSIS — M869 Osteomyelitis, unspecified: Secondary | ICD-10-CM

## 2016-04-18 LAB — BASIC METABOLIC PANEL
Anion gap: 9 (ref 5–15)
BUN: 11 mg/dL (ref 6–20)
CO2: 27 mmol/L (ref 22–32)
Calcium: 8.8 mg/dL — ABNORMAL LOW (ref 8.9–10.3)
Chloride: 101 mmol/L (ref 101–111)
Creatinine, Ser: 0.78 mg/dL (ref 0.61–1.24)
GFR calc Af Amer: >60 mL/min (ref >60)
GFR calc non Af Amer: >60 mL/min (ref >60)
Glucose, Bld: 164 mg/dL — ABNORMAL HIGH (ref 65–99)
Potassium: 3.9 mmol/L (ref 3.5–5.1)
Sodium: 137 mmol/L (ref 135–145)

## 2016-04-18 LAB — GLUCOSE, CAPILLARY
Glucose-Capillary: 156 mg/dL — ABNORMAL HIGH (ref 65–99)
Glucose-Capillary: 115 mg/dL — ABNORMAL HIGH (ref 65–99)
Glucose-Capillary: 145 mg/dL — ABNORMAL HIGH (ref 65–99)
Glucose-Capillary: 146 mg/dL — ABNORMAL HIGH (ref 65–99)

## 2016-04-18 LAB — CBC
HCT: 40.6 % (ref 39.0–52.0)
Hemoglobin: 13.7 g/dL (ref 13.0–17.0)
MCH: 30.3 pg (ref 26.0–34.0)
MCHC: 33.7 g/dL (ref 30.0–36.0)
MCV: 89.8 fL (ref 78.0–100.0)
Platelets: 269 10*3/uL (ref 150–400)
RBC: 4.52 MIL/uL (ref 4.22–5.81)
RDW: 13.8 % (ref 11.5–15.5)
WBC: 8.4 10*3/uL (ref 4.0–10.5)

## 2016-04-18 MED ORDER — CHLORHEXIDINE GLUCONATE 4 % EX LIQD
60.0000 mL | Freq: Once | CUTANEOUS | Status: AC
  Administered 2016-04-19: 4 via TOPICAL
  Filled 2016-04-18 (×2): qty 60

## 2016-04-18 MED ORDER — CLINDAMYCIN PHOSPHATE 900 MG/50ML IV SOLN
900.0000 mg | INTRAVENOUS | Status: AC
  Administered 2016-04-19: 900 mg via INTRAVENOUS
  Filled 2016-04-18 (×2): qty 50

## 2016-04-18 NOTE — Progress Notes (Signed)
Inpatient Diabetes Program Recommendations  AACE/ADA: New Consensus Statement on Inpatient Glycemic Control (2015)  Target Ranges:  Prepandial:   less than 140 mg/dL      Peak postprandial:   less than 180 mg/dL (1-2 hours)      Critically ill patients:  140 - 180 mg/dL   Lab Results  Component Value Date   GLUCAP 156 (H) 04/18/2016   HGBA1C 6.5 (H) 09/26/2015    Review of Glycemic Control  Diabetes history: DM2 Outpatient Diabetes medications: Metformin 500 mg bid Current orders for Inpatient glycemic control: sensitive correction scale Novolog 0-9 units [facility]  Inpatient Diabetes Program Recommendations:  Please consider adding Novolog 0-5 units QHS correction.  Per ADA recommendations "consider performing an A1C on all patients with diabetes or hyperglycemia admitted to the hospital if not performed in the prior 3 months".  Thank you,  Windy Carina, RN, MSN Diabetes Coordinator Inpatient Diabetes Program [phone] (Team Pager)

## 2016-04-18 NOTE — Progress Notes (Signed)
PROGRESS NOTE                                                                                                                                                                                                             Patient Demographics:    [name], is a 66 y.o. male, DOB - 26-May-1950, VPX:[id number]  Admit date - 04/16/2016   Admitting Physician Rise Patience, MD  Outpatient Primary MD for the patient is Irven Shelling, MD  LOS - 2  Chief Complaint  Patient presents with  . Joint Swelling  . Wound Infection  . Foot Swelling       Brief Narrative  [name] is a 66 y.o. male with history of CAD status post stenting last stress test on September 2017, history of diastolic CHF, hypothyroidism and diabetes mellitus with chronic right foot ulceration being managed at [facility] wound clinic presents to the ER because of worsening redness involving the left foot middle toe, Further workup showed left big toe osteomyelitis along with left foot cellulitis.Orthopedics has been consulted and he is due for left third toe versus transmetatarsal amputation on coming Thursday or Friday.   Subjective:    [name] today has, No headache, No chest pain, No abdominal pain - No Nausea, No new weakness tingling or numbness, No Cough - SOB.     Assessment  & Plan :     1.Left foot cellulitis along with left third toe osteomyelitis. Continue present IV antibiotics, monitor cultures, orthopedics consulted, ABI unremarkable, per orthopedics he will require either toe amputation or cross metatarsal amputation to be done likely Friday. We will get preop echocardiogram. Patient is low to moderate risk for any adverse pulmonary outcome during the perioperative period, he can climb 2 flights of stairs, do his daily chores without any chest discomfort or shortness of breath.  2. CAD, history of chronic diastolic CHF EF 46% in mid  2017. Compensated from cardiac standpoint, no chest pain or shortness of breath, continue aspirin, statin for secondary prevention. Blood pressure and heart rate too low for beta blockers. Repeat echocardiogram ordered and pending.  3. Hypothyroidism. On home dose Synthroid, stable TSH.  4. H/O Iron deficiency anemia. Continue oral supplementation.  5. Dyslipidemia. On statin continue.   6. GERD. On PPI.   7. DM type II. Currently on  sliding scale will monitor.  CBG (last 3)   Recent Labs  04/17/16 1724 04/17/16 2128 04/18/16 0754  GLUCAP 125* 284* 156*    Lab Results  Component Value Date   HGBA1C 6.5 (H) 09/26/2015     Diet : Diet heart healthy/carb modified Room service appropriate? Yes; Fluid consistency: Thin    Family Communication  :  None  Code Status :  Full  Disposition Plan  :  TBD  Consults  :  Ortho  Procedures  :    MRI L foot - 3rd toe Osteomylitis  ABI -    RIGHT LEFT  ABI 1.2 1.4   TTE  DVT Prophylaxis  :  Heparin    Lab Results  Component Value Date   PLT 269 04/18/2016    Inpatient Medications  Scheduled Meds: . aspirin EC  81 mg Oral Daily  . atorvastatin  10 mg Oral QPC supper  . ceFEPime (MAXIPIME) IV  1 g Intravenous Q12H  . ferrous sulfate  325 mg Oral Q breakfast  . heparin subcutaneous  5,000 Units Subcutaneous Q8H  . insulin aspart  0-9 Units Subcutaneous TID WC  . levothyroxine  200 mcg Oral QAC breakfast  . multivitamin  1 tablet Oral BID  . pantoprazole  40 mg Oral Daily  . sodium hypochlorite   Irrigation BID  . vancomycin  1,250 mg Intravenous Q12H   Continuous Infusions: PRN Meds:.fluticasone, nitroGLYCERIN, ondansetron **OR** ondansetron (ZOFRAN) IV, zolpidem  Antibiotics  :    Anti-infectives    Start     Dose/Rate Route Frequency Ordered Stop   04/17/16 0600  vancomycin (VANCOCIN) 1,250 mg in sodium chloride 0.9 % 250 mL IVPB     1,250 mg 166.7 mL/hr over 90 Minutes Intravenous Every 12 hours  04/16/16 2210     04/17/16 0600  ceFEPIme (MAXIPIME) 1 g in dextrose 5 % 50 mL IVPB     1 g 100 mL/hr over 30 Minutes Intravenous Every 12 hours 04/16/16 2215     04/16/16 1715  vancomycin (VANCOCIN) IVPB 1000 mg/200 mL premix     1,000 mg 200 mL/hr over 60 Minutes Intravenous  Once 04/16/16 1709 04/16/16 1924   04/16/16 1715  ceFAZolin (ANCEF) IVPB 1 g/50 mL premix     1 g 100 mL/hr over 30 Minutes Intravenous  Once 04/16/16 1709 04/16/16 1824         Objective:   Vitals:   04/16/16 2027 04/17/16 0659 04/17/16 2206 04/18/16 0631  BP: 123/78 (!) 107/58 127/79 126/67  Pulse: (!) 58 (!) 48 70 62  Resp: 20 18 20 18   Temp: 97.9 F (36.6 C) 97.3 F (36.3 C) 98.2 F (36.8 C) 98 F (36.7 C)  TempSrc: Oral Oral Oral Oral  SpO2: 100% 98% 99% 98%  Weight:    102.2 kg (225 lb 3.2 oz)  Height:        Wt Readings from Last 3 Encounters:  04/18/16 102.2 kg (225 lb 3.2 oz)  09/26/15 106.5 kg (234 lb 12.8 oz)  08/05/15 110 kg (242 lb 6.4 oz)     Intake/Output Summary (Last 24 hours) at 04/18/16 0900 Last data filed at 04/17/16 2200  Gross per 24 hour  Intake              730 ml  Output                0 ml  Net  730 ml     Physical Exam  Awake Alert, Oriented X 3, No new F.N deficits, Normal affect [place].AT,PERRAL Supple Neck,No JVD, No cervical lymphadenopathy appriciated.  Symmetrical Chest wall movement, Good air movement bilaterally, CTAB RRR,No Gallops,Rubs or new Murmurs, No Parasternal Heave +ve B.Sounds, Abd Soft, No tenderness, No organomegaly appriciated, No rebound - guarding or rigidity. No Cyanosis, Clubbing or edema, No new Rash or bruise  ,    L 3rd toe      Data Review:    CBC  Recent Labs Lab 04/16/16 1650 04/17/16 0648 04/18/16 0720  WBC 10.2 7.0 8.4  HGB 15.3 13.6 13.7  HCT 44.7 40.5 40.6  PLT 313 272 269  MCV 89.9 90.0 89.8  MCH 30.8 30.2 30.3  MCHC 34.2 33.6 33.7  RDW 13.8 13.5 13.8  LYMPHSABS 1.5  --   --   MONOABS 0.8   --   --   EOSABS 0.1  --   --   BASOSABS 0.0  --   --     Chemistries   Recent Labs Lab 04/16/16 1650 04/17/16 0648 04/18/16 0720  NA 138 136 137  K 3.8 3.8 3.9  CL 100* 99* 101  CO2 26 27 27   GLUCOSE 130* 191* 164*  BUN 11 10 11   CREATININE 0.93 0.75 0.78  CALCIUM 9.6 9.0 8.8*  AST 19  --   --   ALT 12*  --   --   ALKPHOS 86  --   --   BILITOT 1.0  --   --    ------------------------------------------------------------------------------------------------------------------ No results for input(s): CHOL, HDL, LDLCALC, TRIG, CHOLHDL, LDLDIRECT in the last 72 hours.  Lab Results  Component Value Date   HGBA1C 6.5 (H) 09/26/2015   ------------------------------------------------------------------------------------------------------------------  Recent Labs  04/17/16 0945  TSH 1.682   ------------------------------------------------------------------------------------------------------------------ No results for input(s): VITAMINB12, FOLATE, FERRITIN, TIBC, IRON, RETICCTPCT in the last 72 hours.  Coagulation profile No results for input(s): INR, PROTIME in the last 168 hours.  No results for input(s): DDIMER in the last 72 hours.  Cardiac Enzymes No results for input(s): CKMB, TROPONINI, MYOGLOBIN in the last 168 hours.  Invalid input(s): CK ------------------------------------------------------------------------------------------------------------------    Component Value Date/Time   BNP 52.5 09/25/2015 2035    Micro Results No results found for this or any previous visit (from the past 240 hour(s)).  Radiology Reports Mr Foot Left Wo Contrast  Result Date: 04/17/2016 CLINICAL DATA:  Swelling and redness of the left third toe for 1 week in a diabetic patient. EXAM: MRI OF THE LEFT FOOT WITHOUT CONTRAST TECHNIQUE: Multiplanar, multisequence MR imaging of the plain films left foot 04/16/2016. MRI left foot 03/21/2008. Was performed. No intravenous contrast was  administered. COMPARISON:  None. FINDINGS: Bones/Joint/Cartilage The patient is status post amputation at the level of the mid second metatarsal as seen on prior examinations. Marrow signal in the third toe is normal. Intense marrow edema is seen throughout the distal phalanx of the fourth toe. There appears to be an overlying skin ulceration at the tuft. Collapse of the head of the second metatarsal and osteoarthritis about the second MTP joint are again seen. The patient has a hallux valgus deformity. There is bone-on-bone first MTP joint space narrowing. Marrow edema in the head of the first metatarsal extending into the proximal diaphysis is most likely due to osteoarthritis and associated stress change. Subchondral cyst formation and osteophytosis about the joints of the midfoot are compatible with osteoarthritis early neuropathic change. Ligaments Intact.  Muscles and Tendons Intrinsic musculature the foot is atrophied. Soft tissues No abscess is identified. IMPRESSION: Intense marrow edema throughout the distal phalanx of fourth toe consistent with osteomyelitis. There appears to be a skin ulceration at the tuft. Negative for abscess or septic joint. Negative for osteomyelitis of the third toe. Hallux valgus deformity and first MTP osteoarthritis. Marrow edema about the first MTP joint extending into the distal diaphysis of the first metatarsal is likely due to stress change rather than infection. Remote AVN head of the third metatarsal with secondary osteoarthritis of the third MTP joint. Electronically Signed   By: Inge Rise M.D.   On: 04/17/2016 07:34   Dg Foot Complete Left  Result Date: 04/16/2016 CLINICAL DATA:  Swelling/redness. Blood blister on 3rd toe that popped,feeling tenderness in proximal foot EXAM: LEFT FOOT - COMPLETE 3+ VIEW COMPARISON:  MRI 03/21/2008 FINDINGS: Three views of the left foot submitted. Again noted status post amputation of distal second metatarsal and second toe.  Mild hallux valgus deformity. Mild degenerative changes first metatarsal phalangeal joint. There are extensive degenerative changes third metatarsal phalangeal joint with marginal spurring small bony erosion and significant narrowing of joint space. Findings most likely due to sequelae from osteoarthritis or prior osteomyelitis. No definite evidence of acute bone destruction or pathologic fracture. If there is high clinical suspicious for osteomyelitis further correlation with MRI is recommended. Again noted erosive and sclerotic changes at the base of second and third metatarsal probable chronic in nature. IMPRESSION: Again noted status post amputation of distal second metatarsal and second toe. Mild hallux valgus deformity. Mild degenerative changes first metatarsal phalangeal joint. There are extensive degenerative changes third metatarsal phalangeal joint with marginal spurring small bony erosion and significant narrowing of joint space. Findings most likely due to sequelae from osteoarthritis or prior osteomyelitis. No definite evidence of acute bone destruction or pathologic fracture. If there is high clinical suspicious for osteomyelitis further correlation with MRI is recommended. Electronically Signed   By: Lahoma Crocker M.D.   On: 04/16/2016 17:36    Time Spent in minutes  30   Tamberly Pomplun K M.D on 04/18/2016 at 9:00 AM  Between 7am to 7pm - Pager - [phone] ( page via [facility], text pages only, please mention full 10 digit call back number).  After 7pm go to [email or web address] - password [facility]  [facility] -  Office  [phone]

## 2016-04-19 ENCOUNTER — Encounter (HOSPITAL_COMMUNITY)
Admission: EM | Disposition: A | Discharge status: Home / Self Care | Payer: Self-pay | Source: Home / Self Care | Attending: Internal Medicine

## 2016-04-19 ENCOUNTER — Inpatient Hospital Stay (HOSPITAL_COMMUNITY): Payer: Medicare Other | Admitting: Anesthesiology

## 2016-04-19 ENCOUNTER — Encounter (HOSPITAL_COMMUNITY): Payer: Self-pay | Admitting: Surgery

## 2016-04-19 ENCOUNTER — Inpatient Hospital Stay (HOSPITAL_COMMUNITY): Payer: Medicare Other

## 2016-04-19 DIAGNOSIS — R001 Bradycardia, unspecified: Secondary | ICD-10-CM

## 2016-04-19 DIAGNOSIS — I501 Left ventricular failure: Secondary | ICD-10-CM

## 2016-04-19 HISTORY — PX: AMPUTATION: SHX166

## 2016-04-19 LAB — SURGICAL PCR SCREEN
MRSA, PCR: NEGATIVE
Staphylococcus aureus: POSITIVE — AB

## 2016-04-19 LAB — GLUCOSE, CAPILLARY
Glucose-Capillary: 138 mg/dL — ABNORMAL HIGH (ref 65–99)
Glucose-Capillary: 143 mg/dL — ABNORMAL HIGH (ref 65–99)
Glucose-Capillary: 114 mg/dL — ABNORMAL HIGH (ref 65–99)
Glucose-Capillary: 119 mg/dL — ABNORMAL HIGH (ref 65–99)
Glucose-Capillary: 133 mg/dL — ABNORMAL HIGH (ref 65–99)

## 2016-04-19 LAB — ECHOCARDIOGRAM COMPLETE
Height: 80 in
Weight: 3612.8 oz

## 2016-04-19 SURGERY — AMPUTATION, FOOT, PARTIAL
Anesthesia: General | Laterality: Left

## 2016-04-19 MED ORDER — METOCLOPRAMIDE HCL 5 MG/ML IJ SOLN: 5.0000 mg | Freq: Three times a day (TID) | INTRAMUSCULAR | Status: DC | PRN

## 2016-04-19 MED ORDER — ONDANSETRON HCL 4 MG PO TABS: 4.0000 mg | ORAL_TABLET | Freq: Four times a day (QID) | ORAL | Status: DC | PRN

## 2016-04-19 MED ORDER — PROMETHAZINE HCL 25 MG/ML IJ SOLN: 6.2500 mg | INTRAMUSCULAR | Status: DC | PRN

## 2016-04-19 MED ORDER — HYDROMORPHONE HCL 1 MG/ML IJ SOLN: 1.0000 mg | INTRAMUSCULAR | Status: DC | PRN

## 2016-04-19 MED ORDER — MIDAZOLAM HCL 2 MG/2ML IJ SOLN
INTRAMUSCULAR | Status: AC
  Filled 2016-04-19: qty 2

## 2016-04-19 MED ORDER — POLYETHYLENE GLYCOL 3350 17 G PO PACK: 17.0000 g | PACK | Freq: Every day | ORAL | Status: DC | PRN

## 2016-04-19 MED ORDER — METHOCARBAMOL 500 MG PO TABS: 500.0000 mg | ORAL_TABLET | Freq: Four times a day (QID) | ORAL | Status: DC | PRN

## 2016-04-19 MED ORDER — MAGNESIUM CITRATE PO SOLN: 1.0000 | Freq: Once | ORAL | Status: DC | PRN

## 2016-04-19 MED ORDER — METHOCARBAMOL 1000 MG/10ML IJ SOLN
500.0000 mg | Freq: Four times a day (QID) | INTRAVENOUS | Status: DC | PRN
  Filled 2016-04-19: qty 5

## 2016-04-19 MED ORDER — METOCLOPRAMIDE HCL 5 MG PO TABS
5.0000 mg | ORAL_TABLET | Freq: Three times a day (TID) | ORAL | Status: DC | PRN
  Filled 2016-04-19: qty 2

## 2016-04-19 MED ORDER — PROPOFOL 10 MG/ML IV BOLUS
INTRAVENOUS | Status: DC | PRN
  Administered 2016-04-19: 170 mg via INTRAVENOUS

## 2016-04-19 MED ORDER — LIDOCAINE HCL (CARDIAC) 20 MG/ML IV SOLN
INTRAVENOUS | Status: DC | PRN
  Administered 2016-04-19: 100 mg via INTRAVENOUS

## 2016-04-19 MED ORDER — FENTANYL CITRATE (PF) 100 MCG/2ML IJ SOLN
INTRAMUSCULAR | Status: DC | PRN
  Administered 2016-04-19 (×5): 50 ug via INTRAVENOUS

## 2016-04-19 MED ORDER — ACETAMINOPHEN 325 MG PO TABS
650.0000 mg | ORAL_TABLET | Freq: Four times a day (QID) | ORAL | Status: DC | PRN
  Administered 2016-04-19: 650 mg via ORAL
  Filled 2016-04-19: qty 2

## 2016-04-19 MED ORDER — PROPOFOL 10 MG/ML IV BOLUS
INTRAVENOUS | Status: AC
  Filled 2016-04-19: qty 20

## 2016-04-19 MED ORDER — BISACODYL 10 MG RE SUPP: 10.0000 mg | Freq: Every day | RECTAL | Status: DC | PRN

## 2016-04-19 MED ORDER — LIDOCAINE HCL 1 % IJ SOLN
INTRAMUSCULAR | Status: DC | PRN
  Administered 2016-04-19: 30 mL

## 2016-04-19 MED ORDER — DOCUSATE SODIUM 100 MG PO CAPS
100.0000 mg | ORAL_CAPSULE | Freq: Two times a day (BID) | ORAL | Status: DC
  Administered 2016-04-20: 100 mg via ORAL
  Filled 2016-04-19 (×2): qty 1

## 2016-04-19 MED ORDER — LACTATED RINGERS IV SOLN
INTRAVENOUS | Status: DC
Start: 2016-04-19 — End: 2016-04-20
  Administered 2016-04-19: 16:00:00 via INTRAVENOUS

## 2016-04-19 MED ORDER — CHLORHEXIDINE GLUCONATE CLOTH 2 % EX PADS
6.0000 | MEDICATED_PAD | Freq: Every day | CUTANEOUS | Status: DC
  Administered 2016-04-19 – 2016-04-20 (×2): 6 via TOPICAL

## 2016-04-19 MED ORDER — SODIUM CHLORIDE 0.9 % IR SOLN
Status: DC | PRN
  Administered 2016-04-19: 1000 mL

## 2016-04-19 MED ORDER — MUPIROCIN 2 % EX OINT
1.0000 "application " | TOPICAL_OINTMENT | Freq: Two times a day (BID) | CUTANEOUS | Status: DC
  Administered 2016-04-19 – 2016-04-20 (×3): 1 via NASAL
  Filled 2016-04-19: qty 22

## 2016-04-19 MED ORDER — ONDANSETRON HCL 4 MG/2ML IJ SOLN
INTRAMUSCULAR | Status: DC | PRN
  Administered 2016-04-19 (×2): 4 mg via INTRAVENOUS

## 2016-04-19 MED ORDER — ONDANSETRON HCL 4 MG/2ML IJ SOLN
INTRAMUSCULAR | Status: AC
  Filled 2016-04-19: qty 2

## 2016-04-19 MED ORDER — MIDAZOLAM HCL 5 MG/5ML IJ SOLN
INTRAMUSCULAR | Status: DC | PRN
Start: 2016-04-19 — End: 2016-04-19
  Administered 2016-04-19: 2 mg via INTRAVENOUS

## 2016-04-19 MED ORDER — LACTATED RINGERS IV SOLN
INTRAVENOUS | Status: DC | PRN
  Administered 2016-04-19: 16:00:00 via INTRAVENOUS

## 2016-04-19 MED ORDER — SODIUM CHLORIDE 0.9 % IV SOLN: INTRAVENOUS | Status: DC

## 2016-04-19 MED ORDER — LIDOCAINE HCL (PF) 1 % IJ SOLN
INTRAMUSCULAR | Status: AC
  Filled 2016-04-19: qty 30

## 2016-04-19 MED ORDER — HYDROMORPHONE HCL 1 MG/ML IJ SOLN: 0.2500 mg | INTRAMUSCULAR | Status: DC | PRN

## 2016-04-19 MED ORDER — ACETAMINOPHEN 650 MG RE SUPP: 650.0000 mg | Freq: Four times a day (QID) | RECTAL | Status: DC | PRN

## 2016-04-19 MED ORDER — FENTANYL CITRATE (PF) 250 MCG/5ML IJ SOLN
INTRAMUSCULAR | Status: AC
  Filled 2016-04-19: qty 5

## 2016-04-19 MED ORDER — EPHEDRINE SULFATE 50 MG/ML IJ SOLN
INTRAMUSCULAR | Status: DC | PRN
  Administered 2016-04-19: 15 mg via INTRAVENOUS

## 2016-04-19 MED ORDER — ONDANSETRON HCL 4 MG/2ML IJ SOLN: 4.0000 mg | Freq: Four times a day (QID) | INTRAMUSCULAR | Status: DC | PRN

## 2016-04-19 MED ORDER — GLYCOPYRROLATE 0.2 MG/ML IJ SOLN
INTRAMUSCULAR | Status: DC | PRN
  Administered 2016-04-19: 0.4 mg via INTRAVENOUS

## 2016-04-19 MED ORDER — OXYCODONE HCL 5 MG PO TABS
5.0000 mg | ORAL_TABLET | ORAL | Status: DC | PRN
  Administered 2016-04-20: 5 mg via ORAL
  Administered 2016-04-20: 10 mg via ORAL
  Filled 2016-04-19: qty 1
  Filled 2016-04-19: qty 2

## 2016-04-19 SURGICAL SUPPLY — 33 items
BENZOIN TINCTURE PRP APPL 2/3 (GAUZE/BANDAGES/DRESSINGS) IMPLANT
BLADE SAW SGTL HD 18.5X60.5X1. (BLADE) IMPLANT
BLADE SURG 21 STRL SS (BLADE) ×2 IMPLANT
BNDG COHESIVE 4X5 TAN STRL (GAUZE/BANDAGES/DRESSINGS) IMPLANT
BNDG GAUZE ELAST 4 BULKY (GAUZE/BANDAGES/DRESSINGS) IMPLANT
COVER SURGICAL LIGHT HANDLE (MISCELLANEOUS) ×2 IMPLANT
DRAPE INCISE IOBAN 66X45 STRL (DRAPES) IMPLANT
DRAPE U-SHAPE 47X51 STRL (DRAPES) ×2 IMPLANT
DRSG ADAPTIC 3X8 NADH LF (GAUZE/BANDAGES/DRESSINGS) IMPLANT
DRSG PAD ABDOMINAL 8X10 ST (GAUZE/BANDAGES/DRESSINGS) IMPLANT
DURAPREP 26ML APPLICATOR (WOUND CARE) ×2 IMPLANT
ELECT REM PT RETURN 9FT ADLT (ELECTROSURGICAL) ×2
ELECTRODE REM PT RTRN 9FT ADLT (ELECTROSURGICAL) ×1 IMPLANT
GAUZE SPONGE 4X4 12PLY STRL (GAUZE/BANDAGES/DRESSINGS) IMPLANT
GLOVE BIOGEL PI IND STRL 9 (GLOVE) ×1 IMPLANT
GLOVE BIOGEL PI INDICATOR 9 (GLOVE) ×1
GLOVE SURG ORTHO 9.0 STRL STRW (GLOVE) ×2 IMPLANT
GOWN STRL REUS W/ TWL XL LVL3 (GOWN DISPOSABLE) ×3 IMPLANT
GOWN STRL REUS W/TWL XL LVL3 (GOWN DISPOSABLE) ×3
KIT BASIN OR (CUSTOM PROCEDURE TRAY) ×2 IMPLANT
KIT ROOM TURNOVER OR (KITS) ×2 IMPLANT
NS IRRIG 1000ML POUR BTL (IV SOLUTION) ×2 IMPLANT
PACK ORTHO EXTREMITY (CUSTOM PROCEDURE TRAY) ×2 IMPLANT
PAD ARMBOARD 7.5X6 YLW CONV (MISCELLANEOUS) ×4 IMPLANT
PREVENA INCISION MGT 90 150 (MISCELLANEOUS) ×2 IMPLANT
SPONGE LAP 18X18 X RAY DECT (DISPOSABLE) IMPLANT
SUT ETHILON 2 0 PSLX (SUTURE) ×4 IMPLANT
SUT VIC AB 2-0 CTB1 (SUTURE) IMPLANT
TOWEL OR 17X24 6PK STRL BLUE (TOWEL DISPOSABLE) IMPLANT
TOWEL OR 17X26 10 PK STRL BLUE (TOWEL DISPOSABLE) IMPLANT
TUBE CONNECTING 12X1/4 (SUCTIONS) ×2 IMPLANT
WATER STERILE IRR 1000ML POUR (IV SOLUTION) IMPLANT
YANKAUER SUCT BULB TIP NO VENT (SUCTIONS) ×2 IMPLANT

## 2016-04-19 NOTE — Op Note (Signed)
     Date of Surgery: 04/19/2016  INDICATIONS: [name] is a 66 y.o.-year-old male who has had previous amputations to the left foot. He presents at this time with osteomyelitis ulceration the fourth toe and patient elects to proceed with a transmetatarsal amputation versus a single fourth toe amputation.Marland Kitchen  PREOPERATIVE DIAGNOSIS: Abscess osteomyelitis left foot fourth toe with previous ray amputations.  POSTOPERATIVE DIAGNOSIS: Same.  PROCEDURE: Transmetatarsal amputation Application of Prevena wound VAC  SURGEON: Sharol Given, M.D.  ANESTHESIA:  general  IV FLUIDS AND URINE: See anesthesia.  ESTIMATED BLOOD LOSS: Min mL.  COMPLICATIONS: None.  DESCRIPTION OF PROCEDURE: The patient was brought to the operating room and underwent a general anesthetic. After adequate levels of anesthesia were obtained patient's lower extremity was prepped using DuraPrep draped into a sterile field. A timeout was called.  A fishmouth incision was made just proximal to the ulcerative nonviable tissue. This was carried sharply down to bone. A oscillating saw was used to perform a transmetatarsal amputation with a gentle cascade of the metatarsals and beveled plantarly. Electrocautery was used for hemostasis. The wound was irrigated with normal saline. The incision was closed using 2-0 nylon. A Prevena wound VAC was applied. This had a good suction fit. Patient was taken to the PACU in stable condition.  Lucas Score, MD [place] 7:15 PM

## 2016-04-19 NOTE — Anesthesia Postprocedure Evaluation (Signed)
Anesthesia Post Note  Patient: [name]  Procedure(s) Performed: Procedure(s) (LRB): Left Foot 4th Toe Amputation vs. Transmetatarsal (Left)  Patient location during evaluation: PACU Anesthesia Type: General Level of consciousness: awake Pain management: pain level controlled Vital Signs Assessment: post-procedure vital signs reviewed and stable Respiratory status: spontaneous breathing Cardiovascular status: stable Anesthetic complications: no       Last Vitals:  Vitals:   04/19/16 1734 04/19/16 1751  BP: 134/75 125/68  Pulse: 66 65  Resp: 17 20  Temp:  36.4 C    Last Pain:  Vitals:   04/19/16 1751  TempSrc: Oral  PainSc:                  Jeanna Giuffre

## 2016-04-19 NOTE — Transfer of Care (Signed)
Immediate Anesthesia Transfer of Care Note  Patient: [name]  Procedure(s) Performed: Procedure(s): Left Foot 4th Toe Amputation vs. Transmetatarsal (Left)  Patient Location: PACU  Anesthesia Type:General  Level of Consciousness: awake, oriented, sedated, patient cooperative and responds to stimulation  Airway & Oxygen Therapy: Patient Spontanous Breathing and Patient connected to nasal cannula oxygen  Post-op Assessment: Report given to RN, Post -op Vital signs reviewed and stable, Patient moving all extremities and Patient moving all extremities X 4  Post vital signs: Reviewed and stable  Last Vitals:  Vitals:   04/19/16 1420 04/19/16 1719  BP: (!) 145/66   Pulse: (!) 51   Resp: 16   Temp: 36.8 C (P) 36.5 C    Last Pain:  Vitals:   04/19/16 1420  TempSrc: Oral  PainSc:          Complications: No apparent anesthesia complications

## 2016-04-19 NOTE — Anesthesia Preprocedure Evaluation (Addendum)
Anesthesia Evaluation  Patient identified by MRN, date of birth, ID band Patient awake    Reviewed: Allergy & Precautions, NPO status , Patient's Chart, lab work & pertinent test results  Airway Mallampati: I  TM Distance: >3 FB Neck ROM: Full    Dental no notable dental hx. (+) Teeth Intact   Pulmonary sleep apnea ,    Pulmonary exam normal breath sounds clear to auscultation       Cardiovascular hypertension, Pt. on medications + CAD, + Past MI, + Cardiac Stents and + Peripheral Vascular Disease  Normal cardiovascular exam Rhythm:Regular Rate:Normal     Neuro/Psych negative neurological ROS  negative psych ROS   GI/Hepatic Neg liver ROS, GERD  Medicated,  Endo/Other  diabetes, Well Controlled, Type 2Hypothyroidism   Renal/GU negative Renal ROS     Musculoskeletal  (+) Arthritis ,   Abdominal   Peds  Hematology negative hematology ROS (+)   Anesthesia Other Findings   Reproductive/Obstetrics negative OB ROS                            Anesthesia Physical Anesthesia Plan  ASA: IV  Anesthesia Plan: General   Post-op Pain Management:    Induction: Intravenous  Airway Management Planned: LMA  Additional Equipment:   Intra-op Plan:   Post-operative Plan: Extubation in OR  Informed Consent: I have reviewed the patients History and Physical, chart, labs and discussed the procedure including the risks, benefits and alternatives for the proposed anesthesia with the patient or authorized representative who has indicated his/her understanding and acceptance.     Plan Discussed with:   Anesthesia Plan Comments:         Anesthesia Quick Evaluation

## 2016-04-19 NOTE — Progress Notes (Deleted)
Wrong patient

## 2016-04-19 NOTE — Progress Notes (Signed)
PROGRESS NOTE    [name]  YKZ:[id number] DOB: 1950-12-02 DOA: 04/16/2016 PCP: Irven Shelling, MD   Brief Narrative: 66 y.o.malewith history of CAD status post stenting last stress test on September 2017, history of diastolic CHF, hypothyroidism and diabetes mellitus with chronic right foot ulceration being managed at [facility] wound clinic presents to the ER because of worsening redness involving the left foot middle toe, Further workup showed left big toe osteomyelitis along with left foot cellulitis.Orthopedics was consulted and plan for surgery on 3/28.  Assessment & Plan:  # Left foot cellulitis along with a left third toe osteomyelitis: -Planned for orthopedic surgery by Dr. Sharol Given today. - Currently on cefepime and vancomycin. Follow up culture results. -Continue current supportive care.  #Sinus bradycardia: Patient reported that he has sinus bradycardia and follows up with cardiologist. Workup has been done with his cardiologist. He said he is asymptomatic. Echocardiogram pending.  #History of coronary artery disease, history of chronic diastolic congestive heart failure: Compensated and clinically stable. Currently on aspirin and a statin.  #Hypothyroidism: Continue Synthroid  #History of iron deficiency anemia: Continue oral supplement. Monitor CBC  #Dyslipidemia: Continue statin  #Type 2 diabetes: Continue sliding scale and monitor blood sugar level.  Principal Problem:   Cellulitis of left foot Active Problems:   Hypothyroidism   Type 2 diabetes mellitus with vascular disease (HCC)   CAD (coronary artery disease) of artery bypass graft   Sinus bradycardia on ECG   Chronic diastolic CHF (congestive heart failure) (HCC)   Chronic osteomyelitis of toe, left (HCC)   Cellulitis of toe of left foot  DVT prophylaxis: Heparin subcutaneous Code Status: DO NOT RESUSCITATE Family Communication: Patient's wife at bedside Disposition Plan: Likely discharge home in  1-2 days. Or today    Consultants:   Orthopedics  Procedures: Plan for surgery today Antimicrobials: On vancomycin and cefepime since 3/25.  Subjective: Patient was seen and examined at bedside. Denies pain, nausea, vomiting, chest pain or shortness of breath. Plan for OR today. Wife at bedside.  Objective: Vitals:   04/17/16 2206 04/18/16 0631 04/18/16 2221 04/19/16 0622  BP: 127/79 126/67 130/61 123/65  Pulse: 70 62 (!) 52 (!) 44  Resp: 20 18  18   Temp: 98.2 F (36.8 C) 98 F (36.7 C)  97.3 F (36.3 C)  TempSrc: Oral Oral  Oral  SpO2: 99% 98% 98% 98%  Weight:  102.2 kg (225 lb 3.2 oz)  102.4 kg (225 lb 12.8 oz)  Height:        Intake/Output Summary (Last 24 hours) at 04/19/16 1412 Last data filed at 04/19/16 1038  Gross per 24 hour  Intake              300 ml  Output                0 ml  Net              300 ml   Filed Weights   04/16/16 2022 04/18/16 0631 04/19/16 0622  Weight: 101.7 kg (224 lb 1.6 oz) 102.2 kg (225 lb 3.2 oz) 102.4 kg (225 lb 12.8 oz)    Examination:  General exam: Appears calm and comfortable  Respiratory system: Clear to auscultation. Respiratory effort normal. No wheezing or crackle Cardiovascular system: S1 & S2 heard, Regular, bradycardia.  No pedal edema. Gastrointestinal system: Abdomen is nondistended, soft and nontender. Normal bowel sounds heard. Central nervous system: Alert and oriented. No focal neurological deficits. Extremities: Dressing applied on left  foot Skin: No rashes, lesions or ulcers Psychiatry: Judgement and insight appear normal. Mood & affect appropriate.     Data Reviewed: I have personally reviewed following labs and imaging studies  CBC:  Recent Labs Lab 04/16/16 1650 04/17/16 0648 04/18/16 0720  WBC 10.2 7.0 8.4  NEUTROABS 7.8*  --   --   HGB 15.3 13.6 13.7  HCT 44.7 40.5 40.6  MCV 89.9 90.0 89.8  PLT 313 272 427   Basic Metabolic Panel:  Recent Labs Lab 04/16/16 1650 04/17/16 0648  04/18/16 0720  NA 138 136 137  K 3.8 3.8 3.9  CL 100* 99* 101  CO2 26 27 27   GLUCOSE 130* 191* 164*  BUN 11 10 11   CREATININE 0.93 0.75 0.78  CALCIUM 9.6 9.0 8.8*   GFR: Estimated Creatinine Clearance: 121.7 mL/min (by C-G formula based on SCr of 0.78 mg/dL). Liver Function Tests:  Recent Labs Lab 04/16/16 1650  AST 19  ALT 12*  ALKPHOS 86  BILITOT 1.0  PROT 7.9  ALBUMIN 4.0   No results for input(s): LIPASE, AMYLASE in the last 168 hours. No results for input(s): AMMONIA in the last 168 hours. Coagulation Profile: No results for input(s): INR, PROTIME in the last 168 hours. Cardiac Enzymes: No results for input(s): CKTOTAL, CKMB, CKMBINDEX, TROPONINI in the last 168 hours. BNP (last 3 results) No results for input(s): PROBNP in the last 8760 hours. HbA1C: No results for input(s): HGBA1C in the last 72 hours. CBG:  Recent Labs Lab 04/18/16 1217 04/18/16 1658 04/18/16 2109 04/19/16 0817 04/19/16 1133  GLUCAP 115* 145* 146* 138* 143*   Lipid Profile: No results for input(s): CHOL, HDL, LDLCALC, TRIG, CHOLHDL, LDLDIRECT in the last 72 hours. Thyroid Function Tests:  Recent Labs  04/17/16 0945  TSH 1.682   Anemia Panel: No results for input(s): VITAMINB12, FOLATE, FERRITIN, TIBC, IRON, RETICCTPCT in the last 72 hours. Sepsis Labs: No results for input(s): PROCALCITON, LATICACIDVEN in the last 168 hours.  Recent Results (from the past 240 hour(s))  Surgical PCR screen     Status: Abnormal   Collection Time: 04/19/16  6:43 AM  Result Value Ref Range Status   MRSA, PCR NEGATIVE NEGATIVE Final   Staphylococcus aureus POSITIVE (A) NEGATIVE Final    Comment:        The Xpert SA Assay (FDA approved for NASAL specimens in patients over 42 years of age), is one component of a comprehensive surveillance program.  Test performance has been validated by [facility] for patients greater than or equal to 44 year old. It is not intended to diagnose infection  nor to guide or monitor treatment.          Radiology Studies: No results found.      Scheduled Meds: . aspirin EC  81 mg Oral Daily  . atorvastatin  10 mg Oral QPC supper  . ceFEPime (MAXIPIME) IV  1 g Intravenous Q12H  . Chlorhexidine Gluconate Cloth  6 each Topical Daily  . clindamycin (CLEOCIN) IV  900 mg Intravenous On Call to OR  . ferrous sulfate  325 mg Oral Q breakfast  . heparin subcutaneous  5,000 Units Subcutaneous Q8H  . insulin aspart  0-9 Units Subcutaneous TID WC  . levothyroxine  200 mcg Oral QAC breakfast  . multivitamin  1 tablet Oral BID  . mupirocin ointment  1 application Nasal BID  . pantoprazole  40 mg Oral Daily  . sodium hypochlorite   Irrigation BID  . vancomycin  1,250 mg Intravenous Q12H   Continuous Infusions:   LOS: 3 days    Avrohom Mckelvin Tanna Furry, MD [facility] Pager [phone]  If 7PM-7AM, please contact night-coverage [email or web address] Password [email or web address] 04/19/2016, 2:12 PM

## 2016-04-19 NOTE — Interval H&P Note (Signed)
History and Physical Interval Note:  04/19/2016 6:39 AM  [name]  has presented today for surgery, with the diagnosis of Left 4th Toe Osteomyelitis  The various methods of treatment have been discussed with the patient and family. After consideration of risks, benefits and other options for treatment, the patient has consented to  Procedure(s): Left Foot 4th Toe Amputation vs. Transmetatarsal (Left) as a surgical intervention .  The patient's history has been reviewed, patient examined, no change in status, stable for surgery.  I have reviewed the patient's chart and labs.  Questions were answered to the patient's satisfaction.     [name]

## 2016-04-19 NOTE — H&P (View-Only) (Signed)
ORTHOPAEDIC CONSULTATION  REQUESTING PHYSICIAN: Thurnell Lose, MD  Chief Complaint: Charcot collapse right foot with chronic Wagner grade 1 ulcer osteomyelitis of the left foot fourth toe with cellulitis status post second ray amputation left foot.  HPI: [name] is a 66 y.o. male who presents with cellulitis osteomyelitis left foot fifth toe. Patient is status post a second Ray amputation left foot. Patient is currently been on IV antibiotics the cellulitis in the foot has resolved.  Past Medical History:  Diagnosis Date  . Coronary artery disease    a. remote stenting to ramus, prox LAD x2.  . Dental crowns present   . Diabetes mellitus    diet-controlled  . First degree AV block   . GERD (gastroesophageal reflux disease)   . Heart attack 06/2004  . Heart murmur    aortic  . Hx MRSA infection 02/2006  . Hyperlipidemia   . Hypertension    hx. of - has not been on med. since losing wt. after gastric bypass  . Hypothyroidism   . Morbid obesity ([place])   . Mucoid cyst of joint 09/2011   left thumb  . Sleep apnea    sleep study 03/29/2011; no CPAP use  . Trifascicular block    Past Surgical History:  Procedure Laterality Date  . CATARACT EXTRACTION  02/2010; 03/2010  . COLONOSCOPY WITH PROPOFOL N/A 09/14/2014   Procedure: COLONOSCOPY WITH PROPOFOL;  Surgeon: Garlan Fair, MD;  Location: [facility];  Service: Endoscopy;  Laterality: N/A;  . CORONARY ANGIOPLASTY WITH STENT PLACEMENT  07/12/2004; 08/03/2004   total of 3 stents  . I&D EXTREMITY Right 06/04/2014   Procedure: IRRIGATION AND DEBRIDEMENT EXTREMITY;  Surgeon: [name], MD;  Location: [place];  Service: Orthopedics;  Laterality: Right;  . MASS EXCISION  10/04/2011   Procedure: EXCISION MASS;  Surgeon: Wynonia Sours, MD;  Location: [place];  Service: Orthopedics;  Laterality: Left;  excision cyst debridment ip joint of left thumb  . PROSTATECTOMY    . ROUX-EN-Y GASTRIC BYPASS  10/10/2010     laparoscopic  . TOE AMPUTATION  02/23/2006   left foot second ray amputation   Social History   Social History  . Marital status: Married    Spouse name: N/A  . Number of children: N/A  . Years of education: N/A   Occupational History  . CPA taxes    Social History Main Topics  . Smoking status: Never Smoker  . Smokeless tobacco: Never Used  . Alcohol use No  . Drug use: No  . Sexual activity: Not Asked   Other Topics Concern  . None   Social History Narrative   Parents and sister all using CPAP   Family History  Problem Relation Age of Onset  . Heart disease Mother    - negative except otherwise stated in the family history section Allergies  Allergen Reactions  . Quinolones Itching  . Bactrim [Sulfamethoxazole-Trimethoprim] Itching and Rash  . Moxifloxacin Rash  . Penicillins Rash    Reaction at 66 years old Has patient had a PCN reaction causing immediate rash, facial/tongue/throat swelling, SOB or lightheadedness with hypotension: Yes Has patient had a PCN reaction causing severe rash involving mucus membranes or skin necrosis: No Has patient had a PCN reaction that required hospitalization unknown Has patient had a PCN reaction occurring within the last 10 years: No If all of the above answers are "NO", then may proceed with Cephalosporin use.   Prior  to Admission medications   Medication Sig Start Date End Date Taking? Authorizing Provider  aspirin EC 81 MG tablet Take 81 mg by mouth daily.   Yes Historical Provider, MD  atorvastatin (LIPITOR) 10 MG tablet Take 10 mg by mouth daily after supper.    Yes Historical Provider, MD  CALCIUM CITRATE PO Take 500 mg by mouth 3 (three) times daily.   Yes Historical Provider, MD  Cyanocobalamin (B-12) 500 MCG SUBL Place 500 mcg under the tongue every Monday, Wednesday, and Friday.    Yes Historical Provider, MD  ferrous sulfate 325 (65 FE) MG tablet Take 325 mg by mouth daily with breakfast.   Yes Historical Provider,  MD  fluticasone (FLONASE) 50 MCG/ACT nasal spray Place 2 sprays into both nostrils daily as needed for allergies.    Yes Historical Provider, MD  ibuprofen (ADVIL,MOTRIN) 200 MG tablet Take 400 mg by mouth every 6 (six) hours as needed for headache (pain).   Yes Historical Provider, MD  levothyroxine (SYNTHROID, LEVOTHROID) 200 MCG tablet Take 200 mcg by mouth daily before breakfast.  03/07/10  Yes Historical Provider, MD  metFORMIN (GLUCOPHAGE) 500 MG tablet Take 500 mg by mouth 2 (two) times daily with a meal.   Yes Historical Provider, MD  Multiple Vitamin (MULTIVITAMIN WITH MINERALS) TABS tablet Take 2 tablets by mouth 2 (two) times daily.   Yes Historical Provider, MD  Multiple Vitamins-Minerals (PRESERVISION AREDS 2) CAPS Take 1 capsule by mouth 2 (two) times daily.   Yes Historical Provider, MD  nitroGLYCERIN (NITROSTAT) 0.4 MG SL tablet Place 1 tablet (0.4 mg total) under the tongue every 5 (five) minutes x 3 doses as needed for chest pain. 11/26/14  Yes Jerline Pain, MD  Omega-3 Fatty Acids (FISH OIL TRIPLE STRENGTH PO) Take 1 capsule by mouth daily.   Yes Historical Provider, MD  Omeprazole 20 MG TBEC Take 20 mg by mouth daily as needed (acid reflux/ indigestion).  03/07/10  Yes Historical Provider, MD  OVER THE COUNTER MEDICATION Take 1 tablet by mouth 2 (two) times daily. [facility] Joint Health Complex: glucosamine/cat's claw extract   Yes Historical Provider, MD  OVER THE COUNTER MEDICATION Take 3 tablets by mouth daily. Shaklee Pain Relief Complex: Safflower Extract & Boswellia   Yes Historical Provider, MD  OVER THE COUNTER MEDICATION Take 2 tablets by mouth at bedtime as needed (sleep). Remfresh - OTC sleep aid   Yes Historical Provider, MD  Probiotic Product (PROBIOTIC FORMULA PO) Take 2 tablets by mouth daily after supper.    Yes Historical Provider, MD  sildenafil (REVATIO) 20 MG tablet Take 40-100 mg by mouth daily as needed (Erectile Dysfunction).    Yes Historical Provider, MD   zaleplon (SONATA) 10 MG capsule Take 10 mg by mouth at bedtime as needed for sleep.  03/07/10  Yes Historical Provider, MD   Mr Foot Left Wo Contrast  Result Date: 04/17/2016 CLINICAL DATA:  Swelling and redness of the left third toe for 1 week in a diabetic patient. EXAM: MRI OF THE LEFT FOOT WITHOUT CONTRAST TECHNIQUE: Multiplanar, multisequence MR imaging of the plain films left foot 04/16/2016. MRI left foot 03/21/2008. Was performed. No intravenous contrast was administered. COMPARISON:  None. FINDINGS: Bones/Joint/Cartilage The patient is status post amputation at the level of the mid second metatarsal as seen on prior examinations. Marrow signal in the third toe is normal. Intense marrow edema is seen throughout the distal phalanx of the fourth toe. There appears to be an overlying skin ulceration  at the tuft. Collapse of the head of the second metatarsal and osteoarthritis about the second MTP joint are again seen. The patient has a hallux valgus deformity. There is bone-on-bone first MTP joint space narrowing. Marrow edema in the head of the first metatarsal extending into the proximal diaphysis is most likely due to osteoarthritis and associated stress change. Subchondral cyst formation and osteophytosis about the joints of the midfoot are compatible with osteoarthritis early neuropathic change. Ligaments Intact. Muscles and Tendons Intrinsic musculature the foot is atrophied. Soft tissues No abscess is identified. IMPRESSION: Intense marrow edema throughout the distal phalanx of fourth toe consistent with osteomyelitis. There appears to be a skin ulceration at the tuft. Negative for abscess or septic joint. Negative for osteomyelitis of the third toe. Hallux valgus deformity and first MTP osteoarthritis. Marrow edema about the first MTP joint extending into the distal diaphysis of the first metatarsal is likely due to stress change rather than infection. Remote AVN head of the third metatarsal with  secondary osteoarthritis of the third MTP joint. Electronically Signed   By: Inge Rise M.D.   On: 04/17/2016 07:34   Dg Foot Complete Left  Result Date: 04/16/2016 CLINICAL DATA:  Swelling/redness. Blood blister on 3rd toe that popped,feeling tenderness in proximal foot EXAM: LEFT FOOT - COMPLETE 3+ VIEW COMPARISON:  MRI 03/21/2008 FINDINGS: Three views of the left foot submitted. Again noted status post amputation of distal second metatarsal and second toe. Mild hallux valgus deformity. Mild degenerative changes first metatarsal phalangeal joint. There are extensive degenerative changes third metatarsal phalangeal joint with marginal spurring small bony erosion and significant narrowing of joint space. Findings most likely due to sequelae from osteoarthritis or prior osteomyelitis. No definite evidence of acute bone destruction or pathologic fracture. If there is high clinical suspicious for osteomyelitis further correlation with MRI is recommended. Again noted erosive and sclerotic changes at the base of second and third metatarsal probable chronic in nature. IMPRESSION: Again noted status post amputation of distal second metatarsal and second toe. Mild hallux valgus deformity. Mild degenerative changes first metatarsal phalangeal joint. There are extensive degenerative changes third metatarsal phalangeal joint with marginal spurring small bony erosion and significant narrowing of joint space. Findings most likely due to sequelae from osteoarthritis or prior osteomyelitis. No definite evidence of acute bone destruction or pathologic fracture. If there is high clinical suspicious for osteomyelitis further correlation with MRI is recommended. Electronically Signed   By: Lahoma Crocker M.D.   On: 04/16/2016 17:36   - pertinent xrays, CT, MRI studies were reviewed and independently interpreted  Positive ROS: All other systems have been reviewed and were otherwise negative with the exception of those  mentioned in the HPI and as above.  Physical Exam: General: Alert, no acute distress Psychiatric: Patient is competent for consent with normal mood and affect Lymphatic: No axillary or cervical lymphadenopathy Cardiovascular: No pedal edema Respiratory: No cyanosis, no use of accessory musculature GI: No organomegaly, abdomen is soft and non-tender  Skin: Patient has cellulitis and sausage digit swelling of the left foot fourth toe. There is a plantar ulcer.   Neurologic: Patient does not have protective sensation bilateral lower extremities.   MUSCULOSKELETAL:  On examination patient has a good dorsalis pedis pulse bilaterally. He has a Hydrographic surveyor grade 1 ulcer possibly 2 cm in diameter on the right foot 5 mm deep he is status post internal fixation for Charcot collapse of the right foot. Examination the left foot he has sausage digit swelling  cellulitis the MRI scan and radiographs are reviewed and show osteomyelitis chronic of the fourth toe  Assessment: Assessment: Diabetic insensate neuropathy Charcot collapse of the right foot with Wagner grade 1 ulcer over the Charcot rocker-bottom deformity with osteomyelitis ulceration of the left foot fourth toe status post second Ray amputation of the left foot  Plan: Plan: Discussed the patient has 2 options either a fourth toe amputation left foot or a transmetatarsal amputation of the left foot. Discussed that with a transmetatarsal amputation he has less likely to require further surgery with the fourth toe amputation. Patient is at risk for further skin breakdown and possible further amputation surgery. We will plan for surgery on Wednesday patient will make his decision at that time of which surgery to proceed with.  Thank you for the consult and the opportunity to see Mr. Rema Fendt, [place] [phone] 7:42 PM

## 2016-04-19 NOTE — Anesthesia Procedure Notes (Addendum)
Procedure Name: LMA Insertion Date/Time: 04/19/2016 4:32 PM Performed by: Jacquiline Doe A Pre-anesthesia Checklist: Patient identified, Emergency Drugs available, Suction available and Patient being monitored Patient Re-evaluated:Patient Re-evaluated prior to inductionOxygen Delivery Method: Circle System Utilized and Circle system utilized Preoxygenation: Pre-oxygenation with 100% oxygen Intubation Type: IV induction Ventilation: Mask ventilation without difficulty LMA: LMA inserted LMA Size: 5.0 Tube type: Oral Number of attempts: 1 Placement Confirmation: positive ETCO2 Tube secured with: Tape Dental Injury: Teeth and Oropharynx as per pre-operative assessment

## 2016-04-20 ENCOUNTER — Encounter (HOSPITAL_COMMUNITY): Payer: Self-pay | Admitting: Orthopedic Surgery

## 2016-04-20 LAB — GLUCOSE, CAPILLARY
Glucose-Capillary: 194 mg/dL — ABNORMAL HIGH (ref 65–99)
Glucose-Capillary: 174 mg/dL — ABNORMAL HIGH (ref 65–99)
Glucose-Capillary: 159 mg/dL — ABNORMAL HIGH (ref 65–99)

## 2016-04-20 MED ORDER — OXYCODONE HCL 5 MG PO TABS: 5.0000 mg | ORAL_TABLET | Freq: Four times a day (QID) | ORAL | 0 refills | Status: DC | PRN

## 2016-04-20 NOTE — Progress Notes (Signed)
NURSING PROGRESS NOTE  [name] [id number] Discharge Data: 04/20/2016 2:41 PM Attending Provider: Rosita Fire, MD WUJ:[id number],WGNF Lucas John, MD     [place] to be D/C'd Home per MD order.  Discussed with the patient the After Visit Summary and all questions fully answered. All IV's discontinued with no bleeding noted. All belongings returned to patient for patient to take home. Pt sent home with a written prescription for Oxycodone.   Last Vital Signs:  Blood pressure 105/72, pulse 73, temperature 97.8 F (36.6 C), temperature source Oral, resp. rate 17, height 6\' 8"  (2.032 m), weight 102.4 kg (225 lb 12.8 oz), SpO2 98 %.  Discharge Medication List Allergies as of 04/20/2016      Reactions   Bactrim [sulfamethoxazole-trimethoprim] Itching, Rash   Moxifloxacin Rash   Penicillins Rash   Has patient had a PCN reaction causing immediate rash, facial/tongue/throat swelling, SOB or lightheadedness with hypotension: #  #  #  YES  #  #  #  Has patient had a PCN reaction causing severe rash involving mucus membranes or skin necrosis: No Has patient had a PCN reaction that required hospitalization unknown Has patient had a PCN reaction occurring within the last 10 years: No If all of the above answers are "NO", then may proceed with Cephalosporin use.   Quinolones Itching      Medication List    TAKE these medications   aspirin EC 81 MG tablet Take 81 mg by mouth daily.   atorvastatin 10 MG tablet Commonly known as:  LIPITOR Take 10 mg by mouth daily after supper.   B-12 500 MCG Subl Place 500 mcg under the tongue every Monday, Wednesday, and Friday.   CALCIUM CITRATE PO Take 500 mg by mouth 3 (three) times daily.   ferrous sulfate 325 (65 FE) MG tablet Take 325 mg by mouth daily with breakfast.   FISH OIL TRIPLE STRENGTH PO Take 1 capsule by mouth daily.   fluticasone 50 MCG/ACT nasal spray Commonly known as:  FLONASE Place 2 sprays into both nostrils daily as needed  for allergies.   ibuprofen 200 MG tablet Commonly known as:  ADVIL,MOTRIN Take 400 mg by mouth every 6 (six) hours as needed for headache (pain).   levothyroxine 200 MCG tablet Commonly known as:  SYNTHROID, LEVOTHROID Take 200 mcg by mouth daily before breakfast.   metFORMIN 500 MG tablet Commonly known as:  GLUCOPHAGE Take 500 mg by mouth 2 (two) times daily with a meal.   multivitamin with minerals Tabs tablet Take 2 tablets by mouth 2 (two) times daily.   nitroGLYCERIN 0.4 MG SL tablet Commonly known as:  NITROSTAT Place 1 tablet (0.4 mg total) under the tongue every 5 (five) minutes x 3 doses as needed for chest pain.   Omeprazole 20 MG Tbec Take 20 mg by mouth daily as needed (acid reflux/ indigestion).   OVER THE COUNTER MEDICATION Take 1 tablet by mouth 2 (two) times daily. Shaklee Joint Health Complex: glucosamine/cat's claw extract   OVER THE COUNTER MEDICATION Take 3 tablets by mouth daily. [facility]aklee Pain Relief Complex: Safflower Extract & Boswellia   OVER THE COUNTER MEDICATION Take 2 tablets by mouth at bedtime as needed (sleep). Remfresh - OTC sleep aid   oxyCODONE 5 MG immediate release tablet Commonly known as:  Oxy IR/ROXICODONE Take 1 tablet (5 mg total) by mouth every 6 (six) hours as needed for breakthrough pain.   PRESERVISION AREDS 2 Caps Take 1 capsule by mouth 2 (two) times  daily.   PROBIOTIC FORMULA PO Take 2 tablets by mouth daily after supper.   sildenafil 20 MG tablet Commonly known as:  REVATIO Take 40-100 mg by mouth daily as needed (Erectile Dysfunction).   zaleplon 10 MG capsule Commonly known as:  SONATA Take 10 mg by mouth at bedtime as needed for sleep.

## 2016-04-20 NOTE — Progress Notes (Signed)
Patient ID: [name], male   DOB: 1951-01-19, 66 y.o.   MRN: [id number] Postoperative day 1 transmetatarsal amputation on the left. The wound VAC is functioning well there is minimal drainage. Patient feels like he has safe with transfers he has a kneeling scooter at home. Plan for discharge to home when he was safe with therapy and I will follow-up in the office in 1 week. Continue the wound VAC functioning for 1 week.

## 2016-04-20 NOTE — Progress Notes (Signed)
Physical Therapy Evaluation & Discharge Patient Details Name: [name] MRN: [id number] DOB: [birth year] Today's Date: 04/20/2016   History of Present Illness  66 y.o.-year-old male who has had previous amputations to the left foot. He presents at this time with osteomyelitis ulceration the fourth toe and patient elects to proceed with a transmetatarsal amputation versus a single fourth toe amputation.  Clinical Impression  Patient presents with spouse present, has been through several surgeries over past years and is familiar with precautions and limitations. Overall mobility with Supervision to Independent using walker or knee walker device.  Patient describes technique for getting up/down stairs at home independently.  Patient is doing well, no further PT needs identified at this time, is appropriate for return home with spouse when medically appropriate.    Follow Up Recommendations No PT follow up;Supervision - Intermittent    Equipment Recommendations  None recommended by PT (Has equipment at home.)    Recommendations for Other Services       Precautions / Restrictions Precautions Precautions: Fall Precaution Comments: due to L foot NWB status Restrictions Weight Bearing Restrictions: Yes LLE Weight Bearing: Non weight bearing      Mobility  Bed Mobility Overal bed mobility: Independent                Transfers Overall transfer level: Modified independent Equipment used: Rolling walker (2 wheeled) (Also with knee walker)             General transfer comment: Good safety insight and awareness.  Ambulation/Gait Ambulation/Gait assistance: Modified independent (Device/Increase time) Ambulation Distance (Feet): 200 Feet Assistive device: Rolling walker (2 wheeled) (Knee walker)       General Gait Details: Hop in room with walker, used knee walker in hallways.  Stairs            Wheelchair Mobility    Modified Rankin (Stroke Patients Only)        Balance Overall balance assessment: Modified Independent                                           Pertinent Vitals/Pain Pain Assessment: 0-10 Pain Score: 2  Pain Location: Left foot Pain Descriptors / Indicators: Throbbing Pain Intervention(s): Premedicated before session;Monitored during session    [place] expects to be discharged to:: Private residence Living Arrangements: Spouse/significant other Available Help at Discharge: Family Type of Home: House Home Access: Stairs to enter Entrance Stairs-Rails: None Entrance Stairs-Number of Steps: 2 Home Layout: Able to live on main level with bedroom/bathroom Home Equipment: Walker - 2 wheels;Cane - single point (Knee walker) Additional Comments: Uses knee walker primarily.    Prior Function Level of Independence: Independent with assistive device(s)         Comments: Uses knee walker primarily.     Hand Dominance   Dominant Hand: Right    Extremity/Trunk Assessment   Upper Extremity Assessment Upper Extremity Assessment: Overall WFL for tasks assessed    Lower Extremity Assessment Lower Extremity Assessment: Overall WFL for tasks assessed;LLE deficits/detail LLE Deficits / Details: Wound vac and NWB status post trans-metatarsal amputation.    Cervical / Trunk Assessment Cervical / Trunk Assessment: Normal  Communication   Communication: No difficulties  Cognition Arousal/Alertness: Awake/alert Behavior During Therapy: WFL for tasks assessed/performed Overall Cognitive Status: Within Functional Limits for tasks assessed  General Comments General comments (skin integrity, edema, etc.): Wound vac Left foot.    Exercises     Assessment/Plan    PT Assessment Patent does not need any further PT services  PT Problem List         PT Treatment Interventions      PT Goals (Current goals can be found in the Care  Plan section)  Acute Rehab PT Goals Patient Stated Goal: For this to be the last surgery. PT Goal Formulation: All assessment and education complete, DC therapy    Frequency     Barriers to discharge        Co-evaluation               End of Session Equipment Utilized During Treatment: Gait belt Activity Tolerance: Patient tolerated treatment well Patient left: in bed;with call bell/phone within reach;with family/visitor present Nurse Communication: Mobility status PT Visit Diagnosis: Unsteadiness on feet (R26.81);Pain Pain - Right/Left: Left Pain - part of body: Leg;Ankle and joints of foot    Time: 1000-1020 PT Time Calculation (min) (ACUTE ONLY): 20 min   Charges:   PT Evaluation $PT Eval Low Complexity: 1 Procedure     PT G CodesJudith Blonder, DPT  Zenia Resides, Shruti Arrey L 04/20/2016, 10:32 AM

## 2016-04-20 NOTE — Care Management Important Message (Signed)
Important Message  Patient Details  Name: [name] MRN: [id number] Date of Birth: [birth year]   Medicare Important Message Given:  Yes    Nathen May 04/20/2016, 3:37 PM

## 2016-04-20 NOTE — Progress Notes (Signed)
Notified Schorr, NP that central telemetry states pt's HR in the 30s-50s tonight. Pt is asymptomatic. Pt states his HR normally runs in the 40's at nighttime. Will continue to monitor pt. Ranelle Oyster, RN

## 2016-04-20 NOTE — Discharge Summary (Addendum)
Physician Discharge Summary  [name] ZOX:[id number] DOB: [birth year] DOA: 04/16/2016  PCP: Irven Shelling, MD  Admit date: 04/16/2016 Discharge date: 04/20/2016  Admitted From:home Disposition:home with home care services  Recommendations for Outpatient Follow-up:  1. Follow up with PCP and Dr. Sharol Given in 1-2 weeks 2. Please obtain BMP/CBC in one week  Home Health:yes Equipment/Devices:wound vac Discharge Condition:stable CODE STATUS:dnr Diet recommendation:carb modified heart healthy diet  Brief/Interim Summary:66 y.o.malewith history of CAD status post stenting last stress test on September 2017, history of diastolic CHF, hypothyroidism and diabetes mellitus with chronic right foot ulceration being managed at [facility] wound clinic presents to the ER because of worsening redness involving the left foot middle toe, Further workup showed left big toe osteomyelitis along with left foot cellulitis.Orthopedics was consulted and s/p surgery on 3/28.  # Left foot cellulitis along with a left third toe osteomyelitis: -s/p transmetatarsal amputation on the left toes and Application of Prevena wound VAC by Dr Sharol Given on 3/28. Patient is doing well and worked with PT. Discussed with Dr. Sharol Given, recommended no antibiotics on discharge. Pt will be be discharged home with wound vac and home care nurse. Prescription for oxycodone for as needed pain control as per patient's request.  #Sinus bradycardia: Patient reported that he has sinus bradycardia and follows up with cardiologist. Workup has been done with his cardiologist. He said he is asymptomatic. Echocardiogram with EF 45-50 %. Patient does not have chest pain or shortness of breath. Recommended to follow-up with cardiologist outpatient.  #History of coronary artery disease, history of chronic diastolic congestive heart failure: Compensated and clinically stable. Currently on aspirin and statin.  #Hypothyroidism: Continue  Synthroid  #History of iron deficiency anemia: Continue oral supplement. Monitor CBC  #Dyslipidemia: Continue statin  #Type 2 diabetes: On metformin. Recommended to follow-up with PCP.  Patient is clinically better and working well with physical therapy. I discussed with Dr. Sharol Given, recommended no antibiotics and went home with wound VAC and outpatient follow-up. Patient verbalizes understanding. He is clinically stable on discharge.  Discharge Diagnoses:  Principal Problem:   Cellulitis of left foot Active Problems:   Hypothyroidism   Type 2 diabetes mellitus with vascular disease (HCC)   CAD (coronary artery disease) of artery bypass graft   Sinus bradycardia on ECG   Chronic diastolic CHF (congestive heart failure) (HCC)   Chronic osteomyelitis of toe, left (HCC)   Cellulitis of toe of left foot    Discharge Instructions  Discharge Instructions    Call MD for:  difficulty breathing, headache or visual disturbances    Complete by:  As directed    Call MD for:  extreme fatigue    Complete by:  As directed    Call MD for:  hives    Complete by:  As directed    Call MD for:  persistant dizziness or light-headedness    Complete by:  As directed    Call MD for:  persistant nausea and vomiting    Complete by:  As directed    Call MD for:  redness, tenderness, or signs of infection (pain, swelling, redness, odor or green/yellow discharge around incision site)    Complete by:  As directed    Call MD for:  severe uncontrolled pain    Complete by:  As directed    Call MD for:  temperature >100.4    Complete by:  As directed    Diet - low sodium heart healthy    Complete by:  As directed  Diet Carb Modified    Complete by:  As directed    Increase activity slowly    Complete by:  As directed    Negative Pressure Wound Therapy - Incisional    Complete by:  As directed      Allergies as of 04/20/2016      Reactions   Bactrim [sulfamethoxazole-trimethoprim] Itching, Rash    Moxifloxacin Rash   Penicillins Rash   Has patient had a PCN reaction causing immediate rash, facial/tongue/throat swelling, SOB or lightheadedness with hypotension: #  #  #  YES  #  #  #  Has patient had a PCN reaction causing severe rash involving mucus membranes or skin necrosis: No Has patient had a PCN reaction that required hospitalization unknown Has patient had a PCN reaction occurring within the last 10 years: No If all of the above answers are "NO", then may proceed with Cephalosporin use.   Quinolones Itching      Medication List    TAKE these medications   aspirin EC 81 MG tablet Take 81 mg by mouth daily.   atorvastatin 10 MG tablet Commonly known as:  LIPITOR Take 10 mg by mouth daily after supper.   B-12 500 MCG Subl Place 500 mcg under the tongue every Monday, Wednesday, and Friday.   CALCIUM CITRATE PO Take 500 mg by mouth 3 (three) times daily.   ferrous sulfate 325 (65 FE) MG tablet Take 325 mg by mouth daily with breakfast.   FISH OIL TRIPLE STRENGTH PO Take 1 capsule by mouth daily.   fluticasone 50 MCG/ACT nasal spray Commonly known as:  FLONASE Place 2 sprays into both nostrils daily as needed for allergies.   ibuprofen 200 MG tablet Commonly known as:  ADVIL,MOTRIN Take 400 mg by mouth every 6 (six) hours as needed for headache (pain).   levothyroxine 200 MCG tablet Commonly known as:  SYNTHROID, LEVOTHROID Take 200 mcg by mouth daily before breakfast.   metFORMIN 500 MG tablet Commonly known as:  GLUCOPHAGE Take 500 mg by mouth 2 (two) times daily with a meal.   multivitamin with minerals Tabs tablet Take 2 tablets by mouth 2 (two) times daily.   nitroGLYCERIN 0.4 MG SL tablet Commonly known as:  NITROSTAT Place 1 tablet (0.4 mg total) under the tongue every 5 (five) minutes x 3 doses as needed for chest pain.   Omeprazole 20 MG Tbec Take 20 mg by mouth daily as needed (acid reflux/ indigestion).   OVER THE COUNTER MEDICATION Take  1 tablet by mouth 2 (two) times daily. [facility] Complex: glucosamine/cat's claw extract   OVER THE COUNTER MEDICATION Take 3 tablets by mouth daily. [facility] Complex: Safflower Extract & Boswellia   OVER THE COUNTER MEDICATION Take 2 tablets by mouth at bedtime as needed (sleep). Remfresh - OTC sleep aid   oxyCODONE 5 MG immediate release tablet Commonly known as:  Oxy IR/ROXICODONE Take 1 tablet (5 mg total) by mouth every 6 (six) hours as needed for breakthrough pain.   PRESERVISION AREDS 2 Caps Take 1 capsule by mouth 2 (two) times daily.   PROBIOTIC FORMULA PO Take 2 tablets by mouth daily after supper.   sildenafil 20 MG tablet Commonly known as:  REVATIO Take 40-100 mg by mouth daily as needed (Erectile Dysfunction).   zaleplon 10 MG capsule Commonly known as:  SONATA Take 10 mg by mouth at bedtime as needed for sleep.      Follow-up Information  [name], MD. Schedule an appointment as soon as possible for a visit in 1 week.   Specialty:  Orthopedic Surgery Why:  Office is closed today give them a call tomorrow Contact information: [place] [address] [phone]        Irven Shelling, MD. Schedule an appointment as soon as possible for a visit in 1 week.   Specialty:  Internal Medicine Why:  Left message with doctor office for them to call patient at home Contact information: [address] [place] [address] [phone]          Allergies  Allergen Reactions  . Bactrim [Sulfamethoxazole-Trimethoprim] Itching and Rash  . Moxifloxacin Rash  . Penicillins Rash     Has patient had a PCN reaction causing immediate rash, facial/tongue/throat swelling, SOB or lightheadedness with hypotension: #  #  #  YES  #  #  #  Has patient had a PCN reaction causing severe rash involving mucus membranes or skin necrosis: No Has patient had a PCN reaction that required hospitalization  unknown Has patient had a PCN reaction occurring within the last 10 years: No If all of the above answers are "NO", then may proceed with Cephalosporin use.  . Quinolones Itching    Consultations: Orthopedics  Procedures/Studies: Transmetatarsal amputation  Subjective: Patient was seen and examined at bedside. He was working with physical therapy. Reported doing well. Denied headache, dizziness, nausea, vomiting, chest pain or shortness of breath. No leg pain.  Discharge Exam: Vitals:   04/19/16 2103 04/20/16 0638  BP: 113/69 120/64  Pulse: 68 (!) 50  Resp: 18 18  Temp: 97.8 F (36.6 C) 97.8 F (36.6 C)   Vitals:   04/19/16 1734 04/19/16 1751 04/19/16 2103 04/20/16 0638  BP: 134/75 125/68 113/69 120/64  Pulse: 66 65 68 (!) 50  Resp: 17 20 18 18   Temp:  97.5 F (36.4 C) 97.8 F (36.6 C) 97.8 F (36.6 C)  TempSrc:  Oral Oral Oral  SpO2: 99% 97% 98% 98%  Weight:      Height:        General: Pt is alert, awake, not in acute distress Cardiovascular: Regular bradycardia, S1/S2 +, no rubs, no gallops Respiratory: CTA bilaterally, no wheezing, no rhonchi Abdominal: Soft, NT, ND, bowel sounds + Extremities: Left leg with a wound VAC, no sign of bleeding or discharge    The results of significant diagnostics from this hospitalization (including imaging, microbiology, ancillary and laboratory) are listed below for reference.     Microbiology: Recent Results (from the past 240 hour(s))  Surgical PCR screen     Status: Abnormal   Collection Time: 04/19/16  6:43 AM  Result Value Ref Range Status   MRSA, PCR NEGATIVE NEGATIVE Final   Staphylococcus aureus POSITIVE (A) NEGATIVE Final    Comment:        The Xpert SA Assay (FDA approved for NASAL specimens in patients over 48 years of age), is one component of a comprehensive surveillance program.  Test performance has been validated by [facility] for patients greater than or equal to 59 year old. It is not  intended to diagnose infection nor to guide or monitor treatment.      Labs: BNP (last 3 results)  Recent Labs  09/25/15 2035  BNP 39.7   Basic Metabolic Panel:  Recent Labs Lab 04/16/16 1650 04/17/16 0648 04/18/16 0720  NA 138 136 137  K 3.8 3.8 3.9  CL 100*  99* 101  CO2 26 27 27   GLUCOSE 130* 191* 164*  BUN 11 10 11   CREATININE 0.93 0.75 0.78  CALCIUM 9.6 9.0 8.8*   Liver Function Tests:  Recent Labs Lab 04/16/16 1650  AST 19  ALT 12*  ALKPHOS 86  BILITOT 1.0  PROT 7.9  ALBUMIN 4.0   No results for input(s): LIPASE, AMYLASE in the last 168 hours. No results for input(s): AMMONIA in the last 168 hours. CBC:  Recent Labs Lab 04/16/16 1650 04/17/16 0648 04/18/16 0720  WBC 10.2 7.0 8.4  NEUTROABS 7.8*  --   --   HGB 15.3 13.6 13.7  HCT 44.7 40.5 40.6  MCV 89.9 90.0 89.8  PLT 313 272 269   Cardiac Enzymes: No results for input(s): CKTOTAL, CKMB, CKMBINDEX, TROPONINI in the last 168 hours. BNP: Invalid input(s): POCBNP CBG:  Recent Labs Lab 04/19/16 1723 04/19/16 1802 04/19/16 2101 04/20/16 0805 04/20/16 1203  GLUCAP 119* 133* 194* 174* 159*   D-Dimer No results for input(s): DDIMER in the last 72 hours. Hgb A1c No results for input(s): HGBA1C in the last 72 hours. Lipid Profile No results for input(s): CHOL, HDL, LDLCALC, TRIG, CHOLHDL, LDLDIRECT in the last 72 hours. Thyroid function studies No results for input(s): TSH, T4TOTAL, T3FREE, THYROIDAB in the last 72 hours.  Invalid input(s): FREET3 Anemia work up No results for input(s): VITAMINB12, FOLATE, FERRITIN, TIBC, IRON, RETICCTPCT in the last 72 hours. Urinalysis No results found for: COLORURINE, APPEARANCEUR, [place], [place], [place], [place], [place], [place], PROTEINUR, UROBILINOGEN, NITRITE, LEUKOCYTESUR Sepsis Labs Invalid input(s): PROCALCITONIN,  WBC,  LACTICIDVEN Microbiology Recent Results (from the past 240 hour(s))  Surgical PCR screen     Status: Abnormal    Collection Time: 04/19/16  6:43 AM  Result Value Ref Range Status   MRSA, PCR NEGATIVE NEGATIVE Final   Staphylococcus aureus POSITIVE (A) NEGATIVE Final    Comment:        The Xpert SA Assay (FDA approved for NASAL specimens in patients over 14 years of age), is one component of a comprehensive surveillance program.  Test performance has been validated by [facility] for patients greater than or equal to 37 year old. It is not intended to diagnose infection nor to guide or monitor treatment.      Time coordinating discharge: 28 minutes  SIGNED:   Rosita Fire, MD  [facility] 04/20/2016, 1:41 PM  If 7PM-7AM, please contact night-coverage [email or web address] Password [email or web address]

## 2016-04-25 ENCOUNTER — Ambulatory Visit (INDEPENDENT_AMBULATORY_CARE_PROVIDER_SITE_OTHER): Payer: Medicare Other | Admitting: Orthopedic Surgery

## 2016-04-25 ENCOUNTER — Encounter (INDEPENDENT_AMBULATORY_CARE_PROVIDER_SITE_OTHER): Payer: Self-pay | Admitting: Orthopedic Surgery

## 2016-04-25 VITALS — Ht >= 80 in | Wt 225.0 lb

## 2016-04-25 DIAGNOSIS — Z89432 Acquired absence of left foot: Secondary | ICD-10-CM | POA: Insufficient documentation

## 2016-04-25 NOTE — Progress Notes (Signed)
Office Visit Note   Patient: [name]           Date of Birth: Feb 20, 1950           MRN: [id number] Visit Date: 04/25/2016              Requested by: [name], MD [address] [place], [place] [address] PCP: Irven Shelling, MD  Chief Complaint  Patient presents with  . Left Foot - Follow-up    Left transmetatarsal amputation 6 days post op      HPI: The patient is one-week status post left transmetatarsal amputation. Patient states the wound VAC stopped working on Friday it revised to remove the dressing. Patient denies that he fever or chills  Assessment & Plan: Visit Diagnoses:  1. S/P transmetatarsal amputation of foot, left ([place])     Plan: Start Dial soap cleansing dry dressing change daily continue nonweightbearing work on range of motion of the ankle.  Follow-Up Instructions: Return in about 1 week (around 05/02/2016).   Ortho Exam  Patient is alert, oriented, no adenopathy, well-dressed, normal affect, normal respiratory effort. Examination there is some mild maceration around the wound edges there is a small amount of clear bloody drainage no cellulitis no swelling no signs of infection.  Imaging: No results found.  Labs: Lab Results  Component Value Date   HGBA1C 6.5 (H) 09/26/2015   HGBA1C 7.0 (H) 06/04/2014   HGBA1C 7.5 (H) 10/10/2010   REPTSTATUS 07/17/2014 FINAL 06/04/2014   REPTSTATUS 06/09/2014 FINAL 06/04/2014   REPTSTATUS 06/07/2014 FINAL 06/04/2014   GRAMSTAIN  06/04/2014    MODERATE WBC PRESENT, PREDOMINANTLY PMN NO SQUAMOUS EPITHELIAL CELLS SEEN MODERATE GRAM POSITIVE COCCI IN CLUSTERS Performed at [facility]  06/04/2014    MODERATE WBC PRESENT, PREDOMINANTLY PMN NO SQUAMOUS EPITHELIAL CELLS SEEN MODERATE GRAM POSITIVE COCCI IN PAIRS Performed at [facility]  06/04/2014    NO ACID FAST BACILLI ISOLATED IN 6 WEEKS Performed at [facility]  06/04/2014   NO ANAEROBES ISOLATED Performed at [facility]  06/04/2014    FEW GROUP B STREP(S.AGALACTIAE)ISOLATED Note: TESTING AGAINST S. AGALACTIAE NOT ROUTINELY PERFORMED DUE TO PREDICTABILITY OF AMP/PEN/VAN SUSCEPTIBILITY. Performed at [facility]     Orders:  No orders of the defined types were placed in this encounter.  No orders of the defined types were placed in this encounter.    Procedures: No procedures performed  Clinical Data: No additional findings.  ROS:  All other systems negative, except as noted in the HPI. Review of Systems  Objective: Vital Signs: Ht 6\' 8"  (2.032 m)   Wt 225 lb (102.1 kg)   BMI 24.72 kg/m   Specialty Comments:  No specialty comments available.  PMFS History: Patient Active Problem List   Diagnosis Date Noted  . S/P transmetatarsal amputation of foot, left ([place]) 04/25/2016  . Chronic osteomyelitis of toe, left ([place])   . Cellulitis of toe of left foot   . Cellulitis of left foot 04/16/2016  . Chest pain 09/25/2015  . CAD (coronary artery disease) of artery bypass graft 09/25/2015  . Sinus bradycardia on ECG 09/25/2015  . Chronic diastolic CHF (congestive heart failure) ([place]) 09/25/2015  . Chest pain at rest 09/25/2015  . Aortic valvular disorder 08/04/2014  . Disorder of mitral valve 08/04/2014  . Diabetic foot infection ([place]) 06/03/2014  . Peripheral vascular disease ([place])  06/03/2014  . Abscess of foot 06/03/2014  . CAD (coronary artery disease) 07/29/2013  . Trifascicular block 07/29/2013  . 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 Hypothyroidism 01/22/2007  . Type 2 diabetes mellitus with vascular disease ([place]) 01/22/2007  . HYPERLIPIDEMIA, MIXED 01/22/2007  . MYOCARDIAL INFARCTION, HX OF 01/22/2007  . DEGENERATIVE JOINT DISEASE 01/22/2007  . ANEMIA, IRON DEFICIENCY, HX OF 01/22/2007   Past Medical History:  Diagnosis Date  . Coronary artery disease    a. remote stenting to ramus, prox  LAD x2.  . Dental crowns present   . Diabetes mellitus    diet-controlled  . First degree AV block   . GERD (gastroesophageal reflux disease)   . Heart attack 06/2004  . Heart murmur    aortic  . Hx MRSA infection 02/2006  . Hyperlipidemia   . Hypertension    hx. of - has not been on med. since losing wt. after gastric bypass  . Hypothyroidism   . Morbid obesity ([place])   . Mucoid cyst of joint 09/2011   left thumb  . Sleep apnea    sleep study 03/29/2011; no CPAP use  . Trifascicular block     Family History  Problem Relation Age of Onset  . Heart disease Mother     Past Surgical History:  Procedure Laterality Date  . AMPUTATION Left 04/19/2016   Procedure: Left Foot 4th Toe Amputation vs. Transmetatarsal;  Surgeon: [name], MD;  Location: [facility];  Service: Orthopedics;  Laterality: Left;  . CATARACT EXTRACTION  02/2010; 03/2010  . COLONOSCOPY WITH PROPOFOL N/A 09/14/2014   Procedure: COLONOSCOPY WITH PROPOFOL;  Surgeon: Garlan Fair, MD;  Location: [facility];  Service: Endoscopy;  Laterality: N/A;  . CORONARY ANGIOPLASTY WITH STENT PLACEMENT  07/12/2004; 08/03/2004   total of 3 stents  . I&D EXTREMITY Right 06/04/2014   Procedure: IRRIGATION AND DEBRIDEMENT EXTREMITY;  Surgeon: [name], MD;  Location: [facility];  Service: Orthopedics;  Laterality: Right;  . MASS EXCISION  10/04/2011   Procedure: EXCISION MASS;  Surgeon: Wynonia Sours, MD;  Location: [facility];  Service: Orthopedics;  Laterality: Left;  excision cyst debridment ip joint of left thumb  . PROSTATECTOMY    . ROUX-EN-Y GASTRIC BYPASS  10/10/2010   laparoscopic  . TOE AMPUTATION  02/23/2006   left foot second ray amputation   Social History   Occupational History  . CPA taxes    Social History Main Topics  . Smoking status: Never Smoker  . Smokeless tobacco: Never Used  . Alcohol use No  . Drug use: No  . Sexual activity: Not on file

## 2016-04-27 ENCOUNTER — Inpatient Hospital Stay (INDEPENDENT_AMBULATORY_CARE_PROVIDER_SITE_OTHER): Payer: Self-pay | Admitting: Orthopedic Surgery

## 2016-05-01 ENCOUNTER — Encounter (INDEPENDENT_AMBULATORY_CARE_PROVIDER_SITE_OTHER): Payer: Self-pay | Admitting: Orthopedic Surgery

## 2016-05-01 ENCOUNTER — Ambulatory Visit (INDEPENDENT_AMBULATORY_CARE_PROVIDER_SITE_OTHER): Payer: Medicare Other | Admitting: Orthopedic Surgery

## 2016-05-01 VITALS — Ht >= 80 in | Wt 225.0 lb

## 2016-05-01 DIAGNOSIS — E1165 Type 2 diabetes mellitus with hyperglycemia: Secondary | ICD-10-CM | POA: Diagnosis not present

## 2016-05-01 DIAGNOSIS — L03116 Cellulitis of left lower limb: Secondary | ICD-10-CM | POA: Diagnosis not present

## 2016-05-01 DIAGNOSIS — Z7984 Long term (current) use of oral hypoglycemic drugs: Secondary | ICD-10-CM | POA: Diagnosis not present

## 2016-05-01 DIAGNOSIS — Z89432 Acquired absence of left foot: Secondary | ICD-10-CM

## 2016-05-01 DIAGNOSIS — E1149 Type 2 diabetes mellitus with other diabetic neurological complication: Secondary | ICD-10-CM | POA: Diagnosis not present

## 2016-05-01 MED ORDER — NITROGLYCERIN 0.2 MG/HR TD PT24: 0.2000 mg | MEDICATED_PATCH | Freq: Every day | TRANSDERMAL | 12 refills | Status: DC

## 2016-05-01 NOTE — Progress Notes (Signed)
Office Visit Note   Patient: [name]           Date of Birth: May 01, 1950           MRN: [id number] Visit Date: 05/01/2016              Requested by: [name], MD [address] [place], [place] [address] PCP: Irven Shelling, MD  Chief Complaint  Patient presents with  . Left Foot - Routine Post Op    Left transmet amputation 12 days out      HPI: Patient is 5 day status post left transmetatarsal amputation.  Assessment & Plan: Visit Diagnoses:  1. S/P transmetatarsal amputation of foot, left (HCC)     Plan: We'll start her nitroglycerin patch to help improve the microcirculation continue nonweightbearing on the kneeling scooter Dial soap cleansing daily nitroglycerin patch change daily follow up in 1 week to remove the sutures.  Follow-Up Instructions: Return in about 1 week (around 05/08/2016).   Ortho Exam  Patient is alert, oriented, no adenopathy, well-dressed, normal affect, normal respiratory effort.  Examination there is some mild ischemic changes on the dorsal flap there is a very small drop of clear serosanguineous drainage there is no cellulitis no wound dehiscence.  Imaging: No results found.  Labs: Lab Results  Component Value Date   HGBA1C 6.5 (H) 09/26/2015   HGBA1C 7.0 (H) 06/04/2014   HGBA1C 7.5 (H) 10/10/2010   REPTSTATUS 07/17/2014 FINAL 06/04/2014   REPTSTATUS 06/09/2014 FINAL 06/04/2014   REPTSTATUS 06/07/2014 FINAL 06/04/2014   GRAMSTAIN  06/04/2014    MODERATE WBC PRESENT, PREDOMINANTLY PMN NO SQUAMOUS EPITHELIAL CELLS SEEN MODERATE GRAM POSITIVE COCCI IN CLUSTERS Performed at [facility]  06/04/2014    MODERATE WBC PRESENT, PREDOMINANTLY PMN NO SQUAMOUS EPITHELIAL CELLS SEEN MODERATE GRAM POSITIVE COCCI IN PAIRS Performed at [facility]  06/04/2014    NO ACID FAST BACILLI ISOLATED IN 6 WEEKS Performed at [facility]  06/04/2014    NO ANAEROBES  ISOLATED Performed at [facility]  06/04/2014    FEW GROUP B STREP(S.AGALACTIAE)ISOLATED Note: TESTING AGAINST S. AGALACTIAE NOT ROUTINELY PERFORMED DUE TO PREDICTABILITY OF AMP/PEN/VAN SUSCEPTIBILITY. Performed at [facility]     Orders:  No orders of the defined types were placed in this encounter.  Meds ordered this encounter  Medications  . nitroGLYCERIN (NITRODUR - DOSED IN MG/24 HR) 0.2 mg/hr patch    Sig: Place 1 patch (0.2 mg total) onto the skin daily.    Dispense:  30 patch    Refill:  12     Procedures: No procedures performed  Clinical Data: No additional findings.  ROS:  All other systems negative, except as noted in the HPI. Review of Systems  Objective: Vital Signs: Ht 6\' 8"  (2.032 m)   Wt 225 lb (102.1 kg)   BMI 24.72 kg/m   Specialty Comments:  No specialty comments available.  PMFS History: Patient Active Problem List   Diagnosis Date Noted  . S/P transmetatarsal amputation of foot, left ([place]) 04/25/2016  . Chronic osteomyelitis of toe, left ([place])   . Cellulitis of toe of left foot   . Cellulitis of left foot 04/16/2016  . Chest pain 09/25/2015  . CAD (coronary artery disease) of artery bypass graft 09/25/2015  . Sinus bradycardia on ECG 09/25/2015  . Chronic diastolic CHF (congestive heart failure) ([place]) 09/25/2015  .  Chest pain at rest 09/25/2015  . Aortic valvular disorder 08/04/2014  . Disorder of mitral valve 08/04/2014  . Diabetic foot infection ([place]) 06/03/2014  . Peripheral vascular disease ([place]) 06/03/2014  . Abscess of foot 06/03/2014  . CAD (coronary artery disease) 07/29/2013  . Trifascicular block 07/29/2013  . 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 Hypothyroidism 01/22/2007  . Type 2 diabetes mellitus with vascular disease ([place]) 01/22/2007  . HYPERLIPIDEMIA, MIXED 01/22/2007  . MYOCARDIAL INFARCTION, HX OF 01/22/2007  . DEGENERATIVE JOINT DISEASE 01/22/2007  . ANEMIA, IRON  DEFICIENCY, HX OF 01/22/2007   Past Medical History:  Diagnosis Date  . Coronary artery disease    a. remote stenting to ramus, prox LAD x2.  . Dental crowns present   . Diabetes mellitus    diet-controlled  . First degree AV block   . GERD (gastroesophageal reflux disease)   . Heart attack 06/2004  . Heart murmur    aortic  . Hx MRSA infection 02/2006  . Hyperlipidemia   . Hypertension    hx. of - has not been on med. since losing wt. after gastric bypass  . Hypothyroidism   . Morbid obesity ([place])   . Mucoid cyst of joint 09/2011   left thumb  . Sleep apnea    sleep study 03/29/2011; no CPAP use  . Trifascicular block     Family History  Problem Relation Age of Onset  . Heart disease Mother     Past Surgical History:  Procedure Laterality Date  . AMPUTATION Left 04/19/2016   Procedure: Left Foot 4th Toe Amputation vs. Transmetatarsal;  Surgeon: [name], MD;  Location: [facility];  Service: Orthopedics;  Laterality: Left;  . CATARACT EXTRACTION  02/2010; 03/2010  . COLONOSCOPY WITH PROPOFOL N/A 09/14/2014   Procedure: COLONOSCOPY WITH PROPOFOL;  Surgeon: Garlan Fair, MD;  [facility];  Service: Endoscopy;  Laterality: N/A;  . CORONARY ANGIOPLASTY WITH STENT PLACEMENT  07/12/2004; 08/03/2004   total of 3 stents  . I&D EXTREMITY Right 06/04/2014   Procedure: IRRIGATION AND DEBRIDEMENT EXTREMITY;  Surgeon: [name], MD;  Location: [facility];  Service: Orthopedics;  Laterality: Right;  . MASS EXCISION  10/04/2011   Procedure: EXCISION MASS;  Surgeon: Wynonia Sours, MD;  Location: [facility];  Service: Orthopedics;  Laterality: Left;  excision cyst debridment ip joint of left thumb  . PROSTATECTOMY    . ROUX-EN-Y GASTRIC BYPASS  10/10/2010   laparoscopic  . TOE AMPUTATION  02/23/2006   left foot second ray amputation   Social History   Occupational History  . CPA taxes    Social History Main Topics  . Smoking status: Never Smoker  .  Smokeless tobacco: Never Used  . Alcohol use No  . Drug use: No  . Sexual activity: Not on file

## 2016-05-10 ENCOUNTER — Ambulatory Visit (INDEPENDENT_AMBULATORY_CARE_PROVIDER_SITE_OTHER): Payer: Medicare Other | Admitting: Orthopedic Surgery

## 2016-05-11 ENCOUNTER — Ambulatory Visit (INDEPENDENT_AMBULATORY_CARE_PROVIDER_SITE_OTHER): Payer: Medicare Other | Admitting: Orthopedic Surgery

## 2016-05-11 ENCOUNTER — Encounter (INDEPENDENT_AMBULATORY_CARE_PROVIDER_SITE_OTHER): Payer: Self-pay | Admitting: Orthopedic Surgery

## 2016-05-11 DIAGNOSIS — Z89432 Acquired absence of left foot: Secondary | ICD-10-CM

## 2016-05-11 DIAGNOSIS — E1159 Type 2 diabetes mellitus with other circulatory complications: Secondary | ICD-10-CM

## 2016-05-11 NOTE — Progress Notes (Signed)
Post-Op Visit Note   Patient: [name]           Date of Birth: 05-Oct-1950           MRN: [id number] Visit Date: 05/11/2016 PCP: Irven Shelling, MD  Chief Complaint:  Chief Complaint  Patient presents with  . Left Foot - Routine Post Op    HPI:  The patient is a 66 year old gentleman who presents today status post left transmetatarsal amputation. He is about 3 weeks out. Has been using nitroglycerin patches. States he uses Dial soap cleanses with daily. Dry dressings has been using a kneeling scooter to non-weight-bear on the left foot.    Ortho Exam Left transmetatarsal amputation is well approximated with sutures is healing well. There is no gaping no drainage no redness no odor no sign of infection   Visit Diagnoses:  1. Type 2 diabetes mellitus with vascular disease ([place])   2. S/P transmetatarsal amputation of foot, left ([place])     Plan: Sutures harvested today. Will continue with daily dial soap cleansing. Touch down weightbearing through the heel for transfers and getting around the house. We'll follow-up in office in 2 weeks. Hope to advance his weightbearing at that time.  Follow-Up Instructions: Return in about 2 weeks (around 05/25/2016).   Imaging: No results found.  Orders:  No orders of the defined types were placed in this encounter.  No orders of the defined types were placed in this encounter.    PMFS History: Patient Active Problem List   Diagnosis Date Noted  . S/P transmetatarsal amputation of foot, left ([place]) 04/25/2016  . Chronic osteomyelitis of toe, left ([place])   . Cellulitis of toe of left foot   . Chest pain 09/25/2015  . CAD (coronary artery disease) of artery bypass graft 09/25/2015  . Sinus bradycardia on ECG 09/25/2015  . Chronic diastolic CHF (congestive heart failure) ([place]) 09/25/2015  . Chest pain at rest 09/25/2015  . Aortic valvular disorder 08/04/2014  . Disorder of mitral valve 08/04/2014  . Diabetic foot infection ([place])  06/03/2014  . Peripheral vascular disease ([place]) 06/03/2014  . Abscess of foot 06/03/2014  . CAD (coronary artery disease) 07/29/2013  . Trifascicular block 07/29/2013  . 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 Hypothyroidism 01/22/2007  . Type 2 diabetes mellitus with vascular disease ([place]) 01/22/2007  . HYPERLIPIDEMIA, MIXED 01/22/2007  . MYOCARDIAL INFARCTION, HX OF 01/22/2007  . DEGENERATIVE JOINT DISEASE 01/22/2007  . ANEMIA, IRON DEFICIENCY, HX OF 01/22/2007   Past Medical History:  Diagnosis Date  . Coronary artery disease    a. remote stenting to ramus, prox LAD x2.  . Dental crowns present   . Diabetes mellitus    diet-controlled  . First degree AV block   . GERD (gastroesophageal reflux disease)   . Heart attack ([place]) 06/2004  . Heart murmur    aortic  . Hx MRSA infection 02/2006  . Hyperlipidemia   . Hypertension    hx. of - has not been on med. since losing wt. after gastric bypass  . Hypothyroidism   . Morbid obesity ([place])   . Mucoid cyst of joint 09/2011   left thumb  . Sleep apnea    sleep study 03/29/2011; no CPAP use  . Trifascicular block     Family History  Problem Relation Age of Onset  . Heart disease Mother     Past Surgical History:  Procedure Laterality Date  . AMPUTATION Left 04/19/2016  Procedure: Left Foot 4th Toe Amputation vs. Transmetatarsal;  Surgeon: [name], MD;  Location: [facility];  Service: Orthopedics;  Laterality: Left;  . CATARACT EXTRACTION  02/2010; 03/2010  . COLONOSCOPY WITH PROPOFOL N/A 09/14/2014   Procedure: COLONOSCOPY WITH PROPOFOL;  Surgeon: Garlan Fair, MD;  [facility];  Service: Endoscopy;  Laterality: N/A;  . CORONARY ANGIOPLASTY WITH STENT PLACEMENT  07/12/2004; 08/03/2004   total of 3 stents  . I&D EXTREMITY Right 06/04/2014   Procedure: IRRIGATION AND DEBRIDEMENT EXTREMITY;  Surgeon: [name], MD;  Location: [facility];  Service: Orthopedics;  Laterality: Right;  . MASS EXCISION   10/04/2011   Procedure: EXCISION MASS;  Surgeon: Wynonia Sours, MD;  Location: [facility];  Service: Orthopedics;  Laterality: Left;  excision cyst debridment ip joint of left thumb  . PROSTATECTOMY    . ROUX-EN-Y GASTRIC BYPASS  10/10/2010   laparoscopic  . TOE AMPUTATION  02/23/2006   left foot second ray amputation   Social History   Occupational History  . CPA taxes    Social History Main Topics  . Smoking status: Never Smoker  . Smokeless tobacco: Never Used  . Alcohol use No  . Drug use: No  . Sexual activity: Not on file

## 2016-05-25 ENCOUNTER — Encounter (INDEPENDENT_AMBULATORY_CARE_PROVIDER_SITE_OTHER): Payer: Self-pay | Admitting: Orthopedic Surgery

## 2016-05-25 ENCOUNTER — Ambulatory Visit (INDEPENDENT_AMBULATORY_CARE_PROVIDER_SITE_OTHER): Payer: Medicare Other | Admitting: Orthopedic Surgery

## 2016-05-25 VITALS — Ht >= 80 in | Wt 225.0 lb

## 2016-05-25 DIAGNOSIS — Z89432 Acquired absence of left foot: Secondary | ICD-10-CM

## 2016-05-25 NOTE — Progress Notes (Signed)
Office Visit Note   Patient: [name]           Date of Birth: Oct 19, 1950           MRN: [id number] Visit Date: 05/25/2016              Requested by: [name], MD [address] [place], [place] [address] PCP: Irven Shelling, MD  Chief Complaint  Patient presents with  . Left Foot - Routine Post Op    04/19/16 left foot transmet amputation.       HPI: Patient is 5 weeks status post transmetatarsal amputation left foot had a Prevena wound VAC. Currently using moisturizing lotion.  Assessment & Plan: Visit Diagnoses:  1. S/P transmetatarsal amputation of foot, left ([place])     Plan: Recommended that he switch to compression stockings wash with soap and water continue protected weightbearing with a kneeling scooter he is given a prescription for [employer] that he has worked with in the past for extra-depth shoes and custom orthotics a spacer on the left with a carbon plate on the left.  Follow-Up Instructions: Return in about 3 weeks (around 06/15/2016).   Ortho Exam  Patient is alert, oriented, no adenopathy, well-dressed, normal affect, normal respiratory effort. Examination incision is healed quite well he is a little bit of an abrasion over the lateral aspect of his foot but no skin breakdown. The skin is well approximated there is a very small scab no cellulitis no drainage no signs of infection.  Imaging: No results found.  Labs: Lab Results  Component Value Date   HGBA1C 6.5 (H) 09/26/2015   HGBA1C 7.0 (H) 06/04/2014   HGBA1C 7.5 (H) 10/10/2010   REPTSTATUS 07/17/2014 FINAL 06/04/2014   REPTSTATUS 06/09/2014 FINAL 06/04/2014   REPTSTATUS 06/07/2014 FINAL 06/04/2014   GRAMSTAIN  06/04/2014    MODERATE WBC PRESENT, PREDOMINANTLY PMN NO SQUAMOUS EPITHELIAL CELLS SEEN MODERATE GRAM POSITIVE COCCI IN CLUSTERS Performed at [facility]  06/04/2014    MODERATE WBC PRESENT, PREDOMINANTLY PMN NO SQUAMOUS EPITHELIAL CELLS  SEEN MODERATE GRAM POSITIVE COCCI IN PAIRS Performed at [facility]  06/04/2014    NO ACID FAST BACILLI ISOLATED IN 6 WEEKS Performed at [facility]  06/04/2014    NO ANAEROBES ISOLATED Performed at [facility]  06/04/2014    FEW GROUP B STREP(S.AGALACTIAE)ISOLATED Note: TESTING AGAINST S. AGALACTIAE NOT ROUTINELY PERFORMED DUE TO PREDICTABILITY OF AMP/PEN/VAN SUSCEPTIBILITY. Performed at [facility]     Orders:  No orders of the defined types were placed in this encounter.  No orders of the defined types were placed in this encounter.    Procedures: No procedures performed  Clinical Data: No additional findings.  ROS:  All other systems negative, except as noted in the HPI. Review of Systems  Objective: Vital Signs: Ht 6\' 8"  (2.032 m)   Wt 225 lb (102.1 kg)   BMI 24.72 kg/m   Specialty Comments:  No specialty comments available.  PMFS History: Patient Active Problem List   Diagnosis Date Noted  . S/P transmetatarsal amputation of foot, left ([place]) 04/25/2016  . Chronic osteomyelitis of toe, left ([place])   . Cellulitis of toe of left foot   . Chest pain 09/25/2015  . CAD (coronary artery disease) of artery bypass graft 09/25/2015  . Sinus bradycardia on ECG 09/25/2015  . Chronic diastolic CHF (congestive heart failure) (  [place]) 09/25/2015  . Chest pain at rest 09/25/2015  . Aortic valvular disorder 08/04/2014  . Disorder of mitral valve 08/04/2014  . Diabetic foot infection ([place]) 06/03/2014  . Peripheral vascular disease ([place]) 06/03/2014  . Abscess of foot 06/03/2014  . CAD (coronary artery disease) 07/29/2013  . Trifascicular block 07/29/2013  . 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 Hypothyroidism 01/22/2007  . Type 2 diabetes mellitus with vascular disease ([place]) 01/22/2007  . HYPERLIPIDEMIA, MIXED 01/22/2007  . MYOCARDIAL INFARCTION, HX OF 01/22/2007  . DEGENERATIVE JOINT DISEASE  01/22/2007  . ANEMIA, IRON DEFICIENCY, HX OF 01/22/2007   Past Medical History:  Diagnosis Date  . Coronary artery disease    a. remote stenting to ramus, prox LAD x2.  . Dental crowns present   . Diabetes mellitus    diet-controlled  . First degree AV block   . GERD (gastroesophageal reflux disease)   . Heart attack ([place]) 06/2004  . Heart murmur    aortic  . Hx MRSA infection 02/2006  . Hyperlipidemia   . Hypertension    hx. of - has not been on med. since losing wt. after gastric bypass  . Hypothyroidism   . Morbid obesity ([place])   . Mucoid cyst of joint 09/2011   left thumb  . Sleep apnea    sleep study 03/29/2011; no CPAP use  . Trifascicular block     Family History  Problem Relation Age of Onset  . Heart disease Mother     Past Surgical History:  Procedure Laterality Date  . AMPUTATION Left 04/19/2016   Procedure: Left Foot 4th Toe Amputation vs. Transmetatarsal;  Surgeon: [name], MD;  Location: [facility];  Service: Orthopedics;  Laterality: Left;  . CATARACT EXTRACTION  02/2010; 03/2010  . COLONOSCOPY WITH PROPOFOL N/A 09/14/2014   Procedure: COLONOSCOPY WITH PROPOFOL;  Surgeon: Garlan Fair, MD;  [facility];  Service: Endoscopy;  Laterality: N/A;  . CORONARY ANGIOPLASTY WITH STENT PLACEMENT  07/12/2004; 08/03/2004   total of 3 stents  . I&D EXTREMITY Right 06/04/2014   Procedure: IRRIGATION AND DEBRIDEMENT EXTREMITY;  Surgeon: [name], MD;  Location: [facility];  Service: Orthopedics;  Laterality: Right;  . MASS EXCISION  10/04/2011   Procedure: EXCISION MASS;  Surgeon: Wynonia Sours, MD;  Location: [facility];  Service: Orthopedics;  Laterality: Left;  excision cyst debridment ip joint of left thumb  . PROSTATECTOMY    . ROUX-EN-Y GASTRIC BYPASS  10/10/2010   laparoscopic  . TOE AMPUTATION  02/23/2006   left foot second ray amputation   Social History   Occupational History  . CPA taxes    Social History Main Topics  . Smoking  status: Never Smoker  . Smokeless tobacco: Never Used  . Alcohol use No  . Drug use: No  . Sexual activity: Not on file

## 2016-06-08 ENCOUNTER — Telehealth (INDEPENDENT_AMBULATORY_CARE_PROVIDER_SITE_OTHER): Payer: Self-pay | Admitting: Orthopedic Surgery

## 2016-06-08 NOTE — Telephone Encounter (Signed)
05/25/2016 OV NOTE FAXED TO Alison Stalling [phone]

## 2016-06-12 DIAGNOSIS — L97511 Non-pressure chronic ulcer of other part of right foot limited to breakdown of skin: Secondary | ICD-10-CM | POA: Diagnosis not present

## 2016-06-12 DIAGNOSIS — E11621 Type 2 diabetes mellitus with foot ulcer: Secondary | ICD-10-CM | POA: Diagnosis not present

## 2016-06-12 DIAGNOSIS — Z89429 Acquired absence of other toe(s), unspecified side: Secondary | ICD-10-CM | POA: Diagnosis not present

## 2016-06-16 ENCOUNTER — Ambulatory Visit (INDEPENDENT_AMBULATORY_CARE_PROVIDER_SITE_OTHER): Payer: Medicare Other | Admitting: Orthopedic Surgery

## 2016-06-16 ENCOUNTER — Encounter (INDEPENDENT_AMBULATORY_CARE_PROVIDER_SITE_OTHER): Payer: Self-pay | Admitting: Orthopedic Surgery

## 2016-06-16 VITALS — Ht >= 80 in | Wt 225.0 lb

## 2016-06-16 DIAGNOSIS — E1159 Type 2 diabetes mellitus with other circulatory complications: Secondary | ICD-10-CM

## 2016-06-16 DIAGNOSIS — Z89432 Acquired absence of left foot: Secondary | ICD-10-CM

## 2016-06-16 NOTE — Progress Notes (Signed)
Office Visit Note   Patient: [name]           Date of Birth: Mar 18, 1950           MRN: [id number] Visit Date: 06/16/2016              Requested by: [name], MD [address] [place], [place] [address] PCP: [name], MD  Chief Complaint  Patient presents with  . Left Foot - Follow-up    04/19/16 Left transmetatarsal amputation ~8 weeks post op      HPI: Patient is 2 months status post left transmetatarsal amputation. Currently wearing the sock and sleeve 2 layer medical compression stocking. Patient is following Administrator for his shoes orthotics.  Assessment & Plan: Visit Diagnoses:  1. S/P transmetatarsal amputation of foot, left ([place])   2. Type 2 diabetes mellitus with vascular disease ([place])     Plan: Follow-up as needed. Continue scar massage.  Follow-Up Instructions: Return if symptoms worsen or fail to improve.   Ortho Exam  Patient is alert, oriented, no adenopathy, well-dressed, normal affect, normal respiratory effort. Examination the incision is well-healed there is no redness no synovitis no signs of infection. Patient has no venous stasis swelling.  Imaging: No results found.  Labs: Lab Results  Component Value Date   HGBA1C 6.5 (H) 09/26/2015   HGBA1C 7.0 (H) 06/04/2014   HGBA1C 7.5 (H) 10/10/2010   REPTSTATUS 07/17/2014 FINAL 06/04/2014   REPTSTATUS 06/09/2014 FINAL 06/04/2014   REPTSTATUS 06/07/2014 FINAL 06/04/2014   GRAMSTAIN  06/04/2014    MODERATE WBC PRESENT, PREDOMINANTLY PMN NO SQUAMOUS EPITHELIAL CELLS SEEN MODERATE GRAM POSITIVE COCCI IN CLUSTERS Performed at [facility]  06/04/2014    MODERATE WBC PRESENT, PREDOMINANTLY PMN NO SQUAMOUS EPITHELIAL CELLS SEEN MODERATE GRAM POSITIVE COCCI IN PAIRS Performed at [facility]  06/04/2014    NO ACID FAST BACILLI ISOLATED IN 6 WEEKS Performed at [facility]  06/04/2014    NO ANAEROBES ISOLATED Performed at  [facility]  06/04/2014    FEW GROUP B STREP(S.AGALACTIAE)ISOLATED Note: TESTING AGAINST S. AGALACTIAE NOT ROUTINELY PERFORMED DUE TO PREDICTABILITY OF AMP/PEN/VAN SUSCEPTIBILITY. Performed at [facility]     Orders:  No orders of the defined types were placed in this encounter.  No orders of the defined types were placed in this encounter.    Procedures: No procedures performed  Clinical Data: No additional findings.  ROS:  All other systems negative, except as noted in the HPI. Review of Systems  Objective: Vital Signs: Ht 6\' 8"  (2.032 m)   Wt 225 lb (102.1 kg)   BMI 24.72 kg/m   Specialty Comments:  No specialty comments available.  PMFS History: Patient Active Problem List   Diagnosis Date Noted  . S/P transmetatarsal amputation of foot, left ([place]) 04/25/2016  . Chronic osteomyelitis of toe, left ([place])   . Cellulitis of toe of left foot   . Chest pain 09/25/2015  . CAD (coronary artery disease) of artery bypass graft 09/25/2015  . Sinus bradycardia on ECG 09/25/2015  . Chronic diastolic CHF (congestive heart failure) ([place]) 09/25/2015  . Chest pain at rest 09/25/2015  . Aortic valvular disorder 08/04/2014  . Disorder of mitral valve 08/04/2014  . Diabetic foot infection ([place]) 06/03/2014  . Peripheral vascular disease ([place]) 06/03/2014  . Abscess of foot 06/03/2014  . CAD (coronary artery disease)  07/29/2013  . Trifascicular block 07/29/2013  . 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 Hypothyroidism 01/22/2007  . Type 2 diabetes mellitus with vascular disease ([place]) 01/22/2007  . HYPERLIPIDEMIA, MIXED 01/22/2007  . MYOCARDIAL INFARCTION, HX OF 01/22/2007  . DEGENERATIVE JOINT DISEASE 01/22/2007  . ANEMIA, IRON DEFICIENCY, HX OF 01/22/2007   Past Medical History:  Diagnosis Date  . Coronary artery disease    a. remote stenting to ramus, prox LAD x2.  . Dental crowns present   . Diabetes mellitus    diet-controlled   . First degree AV block   . GERD (gastroesophageal reflux disease)   . Heart attack ([place]) 06/2004  . Heart murmur    aortic  . Hx MRSA infection 02/2006  . Hyperlipidemia   . Hypertension    hx. of - has not been on med. since losing wt. after gastric bypass  . Hypothyroidism   . Morbid obesity ([place])   . Mucoid cyst of joint 09/2011   left thumb  . Sleep apnea    sleep study 03/29/2011; no CPAP use  . Trifascicular block     Family History  Problem Relation Age of Onset  . Heart disease Mother     Past Surgical History:  Procedure Laterality Date  . AMPUTATION Left 04/19/2016   Procedure: Left Foot 4th Toe Amputation vs. Transmetatarsal;  Surgeon: [name], MD;  Location: [facility];  Service: Orthopedics;  Laterality: Left;  . CATARACT EXTRACTION  02/2010; 03/2010  . COLONOSCOPY WITH PROPOFOL N/A 09/14/2014   Procedure: COLONOSCOPY WITH PROPOFOL;  Surgeon: Garlan Fair, MD;  [facility];  Service: Endoscopy;  Laterality: N/A;  . CORONARY ANGIOPLASTY WITH STENT PLACEMENT  07/12/2004; 08/03/2004   total of 3 stents  . I&D EXTREMITY Right 06/04/2014   Procedure: IRRIGATION AND DEBRIDEMENT EXTREMITY;  Surgeon: [name], MD;  Location: [facility];  Service: Orthopedics;  Laterality: Right;  . MASS EXCISION  10/04/2011   Procedure: EXCISION MASS;  Surgeon: Wynonia Sours, MD;  Location: [facility];  Service: Orthopedics;  Laterality: Left;  excision cyst debridment ip joint of left thumb  . PROSTATECTOMY    . ROUX-EN-Y GASTRIC BYPASS  10/10/2010   laparoscopic  . TOE AMPUTATION  02/23/2006   left foot second ray amputation   Social History   Occupational History  . CPA taxes    Social History Main Topics  . Smoking status: Never Smoker  . Smokeless tobacco: Never Used  . Alcohol use No  . Drug use: No  . Sexual activity: Not on file

## 2016-06-28 DIAGNOSIS — E1149 Type 2 diabetes mellitus with other diabetic neurological complication: Secondary | ICD-10-CM | POA: Diagnosis not present

## 2016-06-28 DIAGNOSIS — K219 Gastro-esophageal reflux disease without esophagitis: Secondary | ICD-10-CM | POA: Diagnosis not present

## 2016-06-28 DIAGNOSIS — Z7984 Long term (current) use of oral hypoglycemic drugs: Secondary | ICD-10-CM | POA: Diagnosis not present

## 2016-06-28 DIAGNOSIS — D509 Iron deficiency anemia, unspecified: Secondary | ICD-10-CM | POA: Diagnosis not present

## 2016-06-28 DIAGNOSIS — R202 Paresthesia of skin: Secondary | ICD-10-CM | POA: Diagnosis not present

## 2016-06-28 DIAGNOSIS — Z79899 Other long term (current) drug therapy: Secondary | ICD-10-CM | POA: Diagnosis not present

## 2016-06-28 DIAGNOSIS — E039 Hypothyroidism, unspecified: Secondary | ICD-10-CM | POA: Diagnosis not present

## 2016-06-28 DIAGNOSIS — E7800 Pure hypercholesterolemia, unspecified: Secondary | ICD-10-CM | POA: Diagnosis not present

## 2016-06-28 DIAGNOSIS — Z9884 Bariatric surgery status: Secondary | ICD-10-CM | POA: Diagnosis not present

## 2016-06-28 DIAGNOSIS — E1165 Type 2 diabetes mellitus with hyperglycemia: Secondary | ICD-10-CM | POA: Diagnosis not present

## 2016-06-28 DIAGNOSIS — I10 Essential (primary) hypertension: Secondary | ICD-10-CM | POA: Diagnosis not present

## 2016-07-25 ENCOUNTER — Encounter (HOSPITAL_BASED_OUTPATIENT_CLINIC_OR_DEPARTMENT_OTHER): Payer: Self-pay | Admitting: Emergency Medicine

## 2016-07-25 ENCOUNTER — Emergency Department (HOSPITAL_BASED_OUTPATIENT_CLINIC_OR_DEPARTMENT_OTHER)
Admission: EM | Admit: 2016-07-25 | Discharge: 2016-07-25 | Disposition: A | Discharge status: Home / Self Care | Payer: Medicare Other | Attending: Emergency Medicine | Admitting: Emergency Medicine

## 2016-07-25 ENCOUNTER — Emergency Department (HOSPITAL_BASED_OUTPATIENT_CLINIC_OR_DEPARTMENT_OTHER): Payer: Medicare Other

## 2016-07-25 DIAGNOSIS — I2510 Atherosclerotic heart disease of native coronary artery without angina pectoris: Secondary | ICD-10-CM | POA: Diagnosis not present

## 2016-07-25 DIAGNOSIS — E1169 Type 2 diabetes mellitus with other specified complication: Secondary | ICD-10-CM | POA: Diagnosis not present

## 2016-07-25 DIAGNOSIS — I252 Old myocardial infarction: Secondary | ICD-10-CM | POA: Insufficient documentation

## 2016-07-25 DIAGNOSIS — I110 Hypertensive heart disease with heart failure: Secondary | ICD-10-CM | POA: Insufficient documentation

## 2016-07-25 DIAGNOSIS — E039 Hypothyroidism, unspecified: Secondary | ICD-10-CM | POA: Insufficient documentation

## 2016-07-25 DIAGNOSIS — Z79899 Other long term (current) drug therapy: Secondary | ICD-10-CM | POA: Insufficient documentation

## 2016-07-25 DIAGNOSIS — Z955 Presence of coronary angioplasty implant and graft: Secondary | ICD-10-CM | POA: Insufficient documentation

## 2016-07-25 DIAGNOSIS — Z7982 Long term (current) use of aspirin: Secondary | ICD-10-CM | POA: Diagnosis not present

## 2016-07-25 DIAGNOSIS — I10 Essential (primary) hypertension: Secondary | ICD-10-CM | POA: Diagnosis not present

## 2016-07-25 DIAGNOSIS — M7989 Other specified soft tissue disorders: Secondary | ICD-10-CM | POA: Diagnosis not present

## 2016-07-25 DIAGNOSIS — I5032 Chronic diastolic (congestive) heart failure: Secondary | ICD-10-CM | POA: Insufficient documentation

## 2016-07-25 DIAGNOSIS — M25571 Pain in right ankle and joints of right foot: Secondary | ICD-10-CM

## 2016-07-25 LAB — CBC WITH DIFFERENTIAL/PLATELET
Basophils Absolute: 0 10*3/uL (ref 0.0–0.1)
Basophils Relative: 0 %
Eosinophils Absolute: 0.1 10*3/uL (ref 0.0–0.7)
Eosinophils Relative: 1 %
HCT: 38.3 % — ABNORMAL LOW (ref 39.0–52.0)
Hemoglobin: 13.3 g/dL (ref 13.0–17.0)
Lymphocytes Relative: 9 %
Lymphs Abs: 0.8 10*3/uL (ref 0.7–4.0)
MCH: 30.6 pg (ref 26.0–34.0)
MCHC: 34.7 g/dL (ref 30.0–36.0)
MCV: 88.2 fL (ref 78.0–100.0)
Monocytes Absolute: 0.8 10*3/uL (ref 0.1–1.0)
Monocytes Relative: 10 %
Neutro Abs: 6.5 10*3/uL (ref 1.7–7.7)
Neutrophils Relative %: 80 %
Platelets: 259 10*3/uL (ref 150–400)
RBC: 4.34 MIL/uL (ref 4.22–5.81)
RDW: 13 % (ref 11.5–15.5)
WBC: 8.2 10*3/uL (ref 4.0–10.5)

## 2016-07-25 LAB — BASIC METABOLIC PANEL
Anion gap: 9 (ref 5–15)
BUN: 10 mg/dL (ref 6–20)
CO2: 27 mmol/L (ref 22–32)
Calcium: 8.8 mg/dL — ABNORMAL LOW (ref 8.9–10.3)
Chloride: 100 mmol/L — ABNORMAL LOW (ref 101–111)
Creatinine, Ser: 0.66 mg/dL (ref 0.61–1.24)
GFR calc Af Amer: >60 mL/min (ref >60)
GFR calc non Af Amer: >60 mL/min (ref >60)
Glucose, Bld: 159 mg/dL — ABNORMAL HIGH (ref 65–99)
Potassium: 3.5 mmol/L (ref 3.5–5.1)
Sodium: 136 mmol/L (ref 135–145)

## 2016-07-25 NOTE — ED Notes (Signed)
Patient transported to Ultrasound 

## 2016-07-25 NOTE — Discharge Instructions (Signed)
Please follow-up with Dr. Morley Kos for further evaluation and treatment of your ankle pain and swelling. Resume wearing her compression stockings. Please return to emergency department or see your doctor immediately increasing redness or pain.

## 2016-07-25 NOTE — ED Triage Notes (Signed)
Patient states that he has new swelling nad pain to his right ankle, foot and calf region. He had surgery to the same leg in march

## 2016-07-25 NOTE — ED Provider Notes (Signed)
[facility] DEPT [facility] Provider Note   CSN: [id number] Arrival date & time: 07/25/16  1239     History   Chief Complaint Chief Complaint  Patient presents with  . Ankle Pain    HPI [name] is a 66 y.o. male with history of Charcot's foot with hardware in his right, a chronic pressure point wound who presents with a one-week history of right ankle pain. Patient's pain extends to his knee medially. He has had associated swelling and pain to his ankle. Patient reports his wound is looking better than usual. Patient sees his doctor for wound care assessment every 45-60 days. Patient reports he has a pressure point that keeps an open wound, to which he changes dressing every day. Patient reports he was recently on vacation and on his feet more than usual. He is also not been wearing his compression stockings for the past one month. Patient denies any recent surgeries or other immobilizations.  HPI  Past Medical History:  Diagnosis Date  . Coronary artery disease    a. remote stenting to ramus, prox LAD x2.  . Dental crowns present   . Diabetes mellitus    diet-controlled  . First degree AV block   . GERD (gastroesophageal reflux disease)   . Heart attack ([place]) 06/2004  . Heart murmur    aortic  . Hx MRSA infection 02/2006  . Hyperlipidemia   . Hypertension    hx. of - has not been on med. since losing wt. after gastric bypass  . Hypothyroidism   . Morbid obesity ([place])   . Mucoid cyst of joint 09/2011   left thumb  . Sleep apnea    sleep study 03/29/2011; no CPAP use  . Trifascicular block     Patient Active Problem List   Diagnosis Date Noted  . S/P transmetatarsal amputation of foot, left ([place]) 04/25/2016  . Chronic osteomyelitis of toe, left ([place])   . Cellulitis of toe of left foot   . Chest pain 09/25/2015  . CAD (coronary artery disease) of artery bypass graft 09/25/2015  . Sinus bradycardia on ECG 09/25/2015  . Chronic diastolic CHF (congestive heart failure) ([place])  09/25/2015  . Chest pain at rest 09/25/2015  . Aortic valvular disorder 08/04/2014  . Disorder of mitral valve 08/04/2014  . Diabetic foot infection ([place]) 06/03/2014  . Peripheral vascular disease ([place]) 06/03/2014  . Abscess of foot 06/03/2014  . CAD (coronary artery disease) 07/29/2013  . Trifascicular block 07/29/2013  . 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 Hypothyroidism 01/22/2007  . Type 2 diabetes mellitus with vascular disease ([place]) 01/22/2007  . HYPERLIPIDEMIA, MIXED 01/22/2007  . MYOCARDIAL INFARCTION, HX OF 01/22/2007  . DEGENERATIVE JOINT DISEASE 01/22/2007  . ANEMIA, IRON DEFICIENCY, HX OF 01/22/2007    Past Surgical History:  Procedure Laterality Date  . AMPUTATION Left 04/19/2016   Procedure: Left Foot 4th Toe Amputation vs. Transmetatarsal;  Surgeon: [name], MD;  Location: [facility];  Service: Orthopedics;  Laterality: Left;  . CATARACT EXTRACTION  02/2010; 03/2010  . COLONOSCOPY WITH PROPOFOL N/A 09/14/2014   Procedure: COLONOSCOPY WITH PROPOFOL;  Surgeon: Garlan Fair, MD;  Location: [facility];  Service: Endoscopy;  Laterality: N/A;  . CORONARY ANGIOPLASTY WITH STENT PLACEMENT  07/12/2004; 08/03/2004   total of 3 stents  . I&D EXTREMITY Right 06/04/2014   Procedure: IRRIGATION AND DEBRIDEMENT EXTREMITY;  Surgeon: [name], MD;  Location: [facility];  Service: Orthopedics;  Laterality: Right;  .  MASS EXCISION  10/04/2011   Procedure: EXCISION MASS;  Surgeon: Wynonia Sours, MD;  Location: [facility];  Service: Orthopedics;  Laterality: Left;  excision cyst debridment ip joint of left thumb  . PROSTATECTOMY    . ROUX-EN-Y GASTRIC BYPASS  10/10/2010   laparoscopic  . TOE AMPUTATION  02/23/2006   left foot second ray amputation       Home Medications    Prior to Admission medications   Medication Sig Start Date End Date Taking? Authorizing Provider  aspirin EC 81 MG tablet Take 81 mg by mouth daily.    [provider]  atorvastatin (LIPITOR) 10 MG tablet Take 10 mg by mouth daily after supper.     [provider]  CALCIUM CITRATE PO Take 500 mg by mouth 3 (three) times daily.    [provider]  Cyanocobalamin (B-12) 500 MCG SUBL Place 500 mcg under the tongue every Monday, Wednesday, and Friday.     [provider]  ferrous sulfate 325 (65 FE) MG tablet Take 325 mg by mouth daily with breakfast.    [provider]  fluticasone (FLONASE) 50 MCG/ACT nasal spray Place 2 sprays into both nostrils daily as needed for allergies.     [provider]  ibuprofen (ADVIL,MOTRIN) 200 MG tablet Take 400 mg by mouth every 6 (six) hours as needed for headache (pain).    [provider]  levothyroxine (SYNTHROID, LEVOTHROID) 200 MCG tablet Take 200 mcg by mouth daily before breakfast.  03/07/10   [provider]  metFORMIN (GLUCOPHAGE) 500 MG tablet Take 500 mg by mouth 2 (two) times daily with a meal.    [provider]  Multiple Vitamin (MULTIVITAMIN WITH MINERALS) TABS tablet Take 2 tablets by mouth 2 (two) times daily.    [provider]  Multiple Vitamins-Minerals (PRESERVISION AREDS 2) CAPS Take 1 capsule by mouth 2 (two) times daily.    [provider]  nitroGLYCERIN (NITRODUR - DOSED IN MG/24 HR) 0.2 mg/hr patch Place 1 patch (0.2 mg total) onto the skin daily. 05/01/16   [name], MD  nitroGLYCERIN (NITROSTAT) 0.4 MG SL tablet Place 1 tablet (0.4 mg total) under the tongue every 5 (five) minutes x 3 doses as needed for chest pain. 11/26/14   Jerline Pain, MD  Omega-3 Fatty Acids (FISH OIL TRIPLE STRENGTH PO) Take 1 capsule by mouth daily.    [provider]  Omeprazole 20 MG TBEC Take 20 mg by mouth daily as needed (acid reflux/ indigestion).  03/07/10   [provider]  OVER THE COUNTER MEDICATION Take 1 tablet by mouth 2 (two) times daily. [facility] Complex: glucosamine/cat's claw  extract    [provider]  OVER THE COUNTER MEDICATION Take 3 tablets by mouth daily. [facility] Pain Relief Complex: Safflower Extract & Boswellia    [provider]  OVER THE COUNTER MEDICATION Take 2 tablets by mouth at bedtime as needed (sleep). Remfresh - OTC sleep aid    [provider]  oxyCODONE (OXY IR/ROXICODONE) 5 MG immediate release tablet Take 1 tablet (5 mg total) by mouth every 6 (six) hours as needed for breakthrough pain. 04/20/16   Rosita Fire, MD  Probiotic Product (PROBIOTIC FORMULA PO) Take 2 tablets by mouth daily after supper.     [provider]  sildenafil (REVATIO) 20 MG tablet Take 40-100 mg by mouth daily as needed (Erectile Dysfunction).     [provider]  zaleplon (SONATA) 10 MG capsule Take 10 mg by mouth at bedtime as needed for sleep.  03/07/10   [provider]    Family History Family History  Problem Relation Age of Onset  . Heart disease Mother     Social History Social History  Substance Use Topics  . Smoking status: Never Smoker  . Smokeless tobacco: Never Used  . Alcohol use No     Allergies   Bactrim [sulfamethoxazole-trimethoprim]; Moxifloxacin; Penicillins; and Quinolones   Review of Systems Review of Systems  Constitutional: Negative for chills and fever.  HENT: Negative for facial swelling and sore throat.   Respiratory: Negative for shortness of breath.   Cardiovascular: Negative for chest pain.  Gastrointestinal: Negative for abdominal pain, nausea and vomiting.  Genitourinary: Negative for dysuria.  Musculoskeletal: Positive for arthralgias and joint swelling. Negative for back pain.  Skin: Negative for rash and wound.  Neurological: Negative for headaches.  Psychiatric/Behavioral: The patient is not nervous/anxious.      Physical Exam Updated Vital Signs BP 134/78 (BP Location: Right Arm)   Pulse (!) 58   Temp 98.3 F (36.8 C) (Oral)   Resp 18   Ht 6\' 8"   (2.032 m)   Wt 101.6 kg (224 lb)   SpO2 99%   BMI 24.61 kg/m   Physical Exam  Constitutional: He appears well-developed and well-nourished. No distress.  HENT:  Head: Normocephalic and atraumatic.  Mouth/Throat: Oropharynx is clear and moist. No oropharyngeal exudate.  Eyes: Conjunctivae are normal. Pupils are equal, round, and reactive to light. Right eye exhibits no discharge. Left eye exhibits no discharge. No scleral icterus.  Neck: Normal range of motion. Neck supple. No thyromegaly present.  Cardiovascular: Normal rate, regular rhythm, normal heart sounds and intact distal pulses.  Exam reveals no gallop and no friction rub.   No murmur heard. Pulmonary/Chest: Effort normal and breath sounds normal. No stridor. No respiratory distress. He has no wheezes. He has no rales.  Abdominal: Soft. Bowel sounds are normal. He exhibits no distension. There is no tenderness. There is no rebound and no guarding.  Musculoskeletal: He exhibits no edema.       Legs: Medial tenderness from the ankle to superior knee on palpation; edema noted to ankle; area of warmth and erythema to the lateral foot/ankle (patient has neuropathy and cannot report tenderness)  Lymphadenopathy:    He has no cervical adenopathy.  Neurological: He is alert. Coordination normal.  Skin: Skin is warm and dry. No rash noted. He is not diaphoretic. There is erythema. No pallor.  Psychiatric: He has a normal mood and affect.  Nursing note and vitals reviewed.        ED Treatments / Results  Labs (all labs ordered are listed, but only abnormal results are displayed) Labs Reviewed  CBC WITH DIFFERENTIAL/PLATELET - Abnormal; Notable for the following:       Result Value   HCT 38.3 (*)    All other components within normal limits  BASIC METABOLIC PANEL - Abnormal; Notable for the following:    Chloride 100 (*)    Glucose, Bld 159 (*)    Calcium 8.8 (*)    All other components within normal limits    EKG  EKG  Interpretation None       Radiology US Venous Img Lower Unilateral Right  Result Date: 07/25/2016 CLINICAL DATA:  Right ankle and calf pain with swelling, initial encounter EXAM: RIGHT LOWER EXTREMITY VENOUS DOPPLER ULTRASOUND TECHNIQUE: Gray-scale sonography with graded  compression, as well as color Doppler and duplex ultrasound were performed to evaluate the lower extremity deep venous systems from the level of the common femoral vein and including the common femoral, femoral, profunda femoral, popliteal and calf veins including the posterior tibial, peroneal and gastrocnemius veins when visible. The superficial great saphenous vein was also interrogated. Spectral Doppler was utilized to evaluate flow at rest and with distal augmentation maneuvers in the common femoral, femoral and popliteal veins. COMPARISON:  None. FINDINGS: Contralateral Common Femoral Vein: Respiratory phasicity is normal and symmetric with the symptomatic side. No evidence of thrombus. Normal compressibility. Common Femoral Vein: No evidence of thrombus. Normal compressibility, respiratory phasicity and response to augmentation. Saphenofemoral Junction: No evidence of thrombus. Normal compressibility and flow on color Doppler imaging. Profunda Femoral Vein: No evidence of thrombus. Normal compressibility and flow on color Doppler imaging. Femoral Vein: No evidence of thrombus. Normal compressibility, respiratory phasicity and response to augmentation. Popliteal Vein: No evidence of thrombus. Normal compressibility, respiratory phasicity and response to augmentation. Calf Veins: No evidence of thrombus. Normal compressibility and flow on color Doppler imaging. Superficial Great Saphenous Vein: No evidence of thrombus. Normal compressibility and flow on color Doppler imaging. Venous Reflux:  None. Other Findings:  None. IMPRESSION: No evidence of DVT within the right lower extremity. Electronically Signed   By: Inez Catalina M.D.   On:  07/25/2016 14:10    Procedures Procedures (including critical care time)  Medications Ordered in ED Medications - No data to display   Initial Impression / Assessment and Plan / ED Course  I have reviewed the triage vital signs and the nursing notes.  Pertinent labs & imaging results that were available during my care of the patient were reviewed by me and considered in my medical decision making (see chart for details).     Patient without evidence of DVT. CBC, BMP within normal limits. Suspect symptoms may be related to lack of compression stockings and being on his feet a lot. After discussion by Dr. Alvino Chapel with the patient, patient will resume wearing compression stockings. If he continues to have been increasing redness or swelling, he will follow up with his doctor for antibiotics for concern for cellulitis. Chronic wound shows no signs of infection. Patient is to follow-up with his plastic surgeon, Dr. Morley Kos, for further evaluation and treatment. Return precautions discussed. Patient understands and agrees to plan. Patient vitals stable throughout ED course discharged in satisfactory condition. Patient also evaluated by Dr. Alvino Chapel who guided the patient's management and agrees with plan.  Final Clinical Impressions(s) / ED Diagnoses   Final diagnoses:  Acute right ankle pain    New Prescriptions New Prescriptions   No medications on file     Caryl Ada 07/25/16 1501    Davonna Belling, MD 07/25/16 1526

## 2016-07-30 ENCOUNTER — Emergency Department (HOSPITAL_BASED_OUTPATIENT_CLINIC_OR_DEPARTMENT_OTHER): Payer: Medicare Other

## 2016-07-30 ENCOUNTER — Encounter (HOSPITAL_BASED_OUTPATIENT_CLINIC_OR_DEPARTMENT_OTHER): Payer: Self-pay | Admitting: Emergency Medicine

## 2016-07-30 ENCOUNTER — Emergency Department (HOSPITAL_BASED_OUTPATIENT_CLINIC_OR_DEPARTMENT_OTHER)
Admission: EM | Admit: 2016-07-30 | Discharge: 2016-07-30 | Disposition: A | Discharge status: Home / Self Care | Payer: Medicare Other | Attending: Emergency Medicine | Admitting: Emergency Medicine

## 2016-07-30 DIAGNOSIS — I252 Old myocardial infarction: Secondary | ICD-10-CM | POA: Insufficient documentation

## 2016-07-30 DIAGNOSIS — N390 Urinary tract infection, site not specified: Secondary | ICD-10-CM | POA: Insufficient documentation

## 2016-07-30 DIAGNOSIS — I110 Hypertensive heart disease with heart failure: Secondary | ICD-10-CM | POA: Insufficient documentation

## 2016-07-30 DIAGNOSIS — J0110 Acute frontal sinusitis, unspecified: Secondary | ICD-10-CM | POA: Diagnosis not present

## 2016-07-30 DIAGNOSIS — Z955 Presence of coronary angioplasty implant and graft: Secondary | ICD-10-CM | POA: Diagnosis not present

## 2016-07-30 DIAGNOSIS — I10 Essential (primary) hypertension: Secondary | ICD-10-CM | POA: Insufficient documentation

## 2016-07-30 DIAGNOSIS — Z951 Presence of aortocoronary bypass graft: Secondary | ICD-10-CM | POA: Insufficient documentation

## 2016-07-30 DIAGNOSIS — R8271 Bacteriuria: Secondary | ICD-10-CM

## 2016-07-30 DIAGNOSIS — M19071 Primary osteoarthritis, right ankle and foot: Secondary | ICD-10-CM | POA: Diagnosis not present

## 2016-07-30 DIAGNOSIS — R509 Fever, unspecified: Secondary | ICD-10-CM | POA: Diagnosis not present

## 2016-07-30 DIAGNOSIS — E11628 Type 2 diabetes mellitus with other skin complications: Secondary | ICD-10-CM | POA: Insufficient documentation

## 2016-07-30 DIAGNOSIS — Z87898 Personal history of other specified conditions: Secondary | ICD-10-CM

## 2016-07-30 DIAGNOSIS — Z7982 Long term (current) use of aspirin: Secondary | ICD-10-CM | POA: Diagnosis not present

## 2016-07-30 DIAGNOSIS — I5032 Chronic diastolic (congestive) heart failure: Secondary | ICD-10-CM | POA: Insufficient documentation

## 2016-07-30 DIAGNOSIS — Z79899 Other long term (current) drug therapy: Secondary | ICD-10-CM | POA: Diagnosis not present

## 2016-07-30 DIAGNOSIS — I2510 Atherosclerotic heart disease of native coronary artery without angina pectoris: Secondary | ICD-10-CM | POA: Diagnosis not present

## 2016-07-30 DIAGNOSIS — E039 Hypothyroidism, unspecified: Secondary | ICD-10-CM | POA: Insufficient documentation

## 2016-07-30 LAB — COMPREHENSIVE METABOLIC PANEL
ALT: 30 U/L (ref 17–63)
AST: 38 U/L (ref 15–41)
Albumin: 3.1 g/dL — ABNORMAL LOW (ref 3.5–5.0)
Alkaline Phosphatase: 58 U/L (ref 38–126)
Anion gap: 11 (ref 5–15)
BUN: 15 mg/dL (ref 6–20)
CO2: 24 mmol/L (ref 22–32)
Calcium: 8.8 mg/dL — ABNORMAL LOW (ref 8.9–10.3)
Chloride: 100 mmol/L — ABNORMAL LOW (ref 101–111)
Creatinine, Ser: 0.85 mg/dL (ref 0.61–1.24)
GFR calc Af Amer: >60 mL/min (ref >60)
GFR calc non Af Amer: >60 mL/min (ref >60)
Glucose, Bld: 169 mg/dL — ABNORMAL HIGH (ref 65–99)
Potassium: 3.4 mmol/L — ABNORMAL LOW (ref 3.5–5.1)
Sodium: 135 mmol/L (ref 135–145)
Total Bilirubin: 1.2 mg/dL (ref 0.3–1.2)
Total Protein: 7 g/dL (ref 6.5–8.1)

## 2016-07-30 LAB — URINALYSIS, MICROSCOPIC (REFLEX): RBC / HPF: NONE SEEN RBC/hpf (ref 0–5)

## 2016-07-30 LAB — CBC WITH DIFFERENTIAL/PLATELET
Basophils Absolute: 0 10*3/uL (ref 0.0–0.1)
Basophils Relative: 0 %
Eosinophils Absolute: 0 10*3/uL (ref 0.0–0.7)
Eosinophils Relative: 0 %
HCT: 38.1 % — ABNORMAL LOW (ref 39.0–52.0)
Hemoglobin: 13.6 g/dL (ref 13.0–17.0)
Lymphocytes Relative: 5 %
Lymphs Abs: 0.3 10*3/uL — ABNORMAL LOW (ref 0.7–4.0)
MCH: 30.8 pg (ref 26.0–34.0)
MCHC: 35.7 g/dL (ref 30.0–36.0)
MCV: 86.2 fL (ref 78.0–100.0)
Monocytes Absolute: 0.3 10*3/uL (ref 0.1–1.0)
Monocytes Relative: 6 %
Neutro Abs: 5.2 10*3/uL (ref 1.7–7.7)
Neutrophils Relative %: 89 %
Platelets: 213 10*3/uL (ref 150–400)
RBC: 4.42 MIL/uL (ref 4.22–5.81)
RDW: 13.2 % (ref 11.5–15.5)
WBC: 5.8 10*3/uL (ref 4.0–10.5)

## 2016-07-30 LAB — URINALYSIS, ROUTINE W REFLEX MICROSCOPIC
Glucose, UA: NEGATIVE mg/dL
Hgb urine dipstick: NEGATIVE
Ketones, ur: 40 mg/dL — AB
Nitrite: NEGATIVE
Protein, ur: 30 mg/dL — AB
Specific Gravity, Urine: 1.041 — ABNORMAL HIGH (ref 1.005–1.030)
pH: 6 (ref 5.0–8.0)

## 2016-07-30 LAB — I-STAT CG4 LACTIC ACID, ED: Lactic Acid, Venous: 1.29 mmol/L (ref 0.5–1.9)

## 2016-07-30 MED ORDER — CEPHALEXIN 250 MG PO CAPS: 250.0000 mg | ORAL_CAPSULE | Freq: Four times a day (QID) | ORAL | 0 refills | Status: DC

## 2016-07-30 NOTE — ED Triage Notes (Signed)
Patient reports fever and chills which began Thursday.  Reports alternating between tylenol and motrin at home to treat fever.  Reports taking motrin this morning for fever of 102.  Denies dysuria, abdominal pain.  Only complaint at present is "sinus pressure headache"

## 2016-07-30 NOTE — Discharge Instructions (Signed)
Read the information below.  Use the prescribed medication as directed.  Please discuss all new medications with your pharmacist.  You may return to the Emergency Department at any time for worsening condition or any new symptoms that concern you.    °

## 2016-07-30 NOTE — ED Notes (Signed)
No localized erythema around foot ulcer noted. Non-tender.

## 2016-07-30 NOTE — ED Provider Notes (Signed)
[facility] DEPT [facility] Provider Note   CSN: [id number] Arrival date & time: 07/30/16  1346     History   Chief Complaint Chief Complaint  Patient presents with  . Fever    HPI [name] is a 66 y.o. male.  HPI   Pt with hx DM, CAD s/p MI, HTN, HLD p/w fevers, chills, sweats x 4 days.  Is using tylenol/motrin that helps temporarily.  Does have frontal sinus headache that comes and goes, in the forehead only, has been going on in the past 3 days.  Has hx charcot foot repair several months ago and has been healing slowly, developed pain in his right calf and foot 6 days ago, was seen in ED a ruled out for DVT, did not appear infected.  Denies URI symptoms including ear pain, sore throat, cough, denies abdominal pain, vomiting, diarrhea, urinary symptoms.  Denies tick bites or significant time spent outside in wooded areas.   Past Medical History:  Diagnosis Date  . Coronary artery disease    a. remote stenting to ramus, prox LAD x2.  . Dental crowns present   . Diabetes mellitus    diet-controlled  . First degree AV block   . GERD (gastroesophageal reflux disease)   . Heart attack ([place]) 06/2004  . Heart murmur    aortic  . Hx MRSA infection 02/2006  . Hyperlipidemia   . Hypertension    hx. of - has not been on med. since losing wt. after gastric bypass  . Hypothyroidism   . Morbid obesity ([place])   . Mucoid cyst of joint 09/2011   left thumb  . Sleep apnea    sleep study 03/29/2011; no CPAP use  . Trifascicular block     Patient Active Problem List   Diagnosis Date Noted  . S/P transmetatarsal amputation of foot, left ([place]) 04/25/2016  . Chronic osteomyelitis of toe, left ([place])   . Cellulitis of toe of left foot   . Chest pain 09/25/2015  . CAD (coronary artery disease) of artery bypass graft 09/25/2015  . Sinus bradycardia on ECG 09/25/2015  . Chronic diastolic CHF (congestive heart failure) ([place]) 09/25/2015  . Chest pain at rest 09/25/2015  . Aortic valvular disorder  08/04/2014  . Disorder of mitral valve 08/04/2014  . Diabetic foot infection ([place]) 06/03/2014  . Peripheral vascular disease ([place]) 06/03/2014  . Abscess of foot 06/03/2014  . CAD (coronary artery disease) 07/29/2013  . Trifascicular block 07/29/2013  . 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 Hypothyroidism 01/22/2007  . Type 2 diabetes mellitus with vascular disease ([place]) 01/22/2007  . HYPERLIPIDEMIA, MIXED 01/22/2007  . MYOCARDIAL INFARCTION, HX OF 01/22/2007  . DEGENERATIVE JOINT DISEASE 01/22/2007  . ANEMIA, IRON DEFICIENCY, HX OF 01/22/2007    Past Surgical History:  Procedure Laterality Date  . AMPUTATION Left 04/19/2016   Procedure: Left Foot 4th Toe Amputation vs. Transmetatarsal;  Surgeon: [name], MD;  Location: [facility];  Service: Orthopedics;  Laterality: Left;  . CATARACT EXTRACTION  02/2010; 03/2010  . COLONOSCOPY WITH PROPOFOL N/A 09/14/2014   Procedure: COLONOSCOPY WITH PROPOFOL;  Surgeon: Garlan Fair, MD;  Location: [facility];  Service: Endoscopy;  Laterality: N/A;  . CORONARY ANGIOPLASTY WITH STENT PLACEMENT  07/12/2004; 08/03/2004   total of 3 stents  . I&D EXTREMITY Right 06/04/2014   Procedure: IRRIGATION AND DEBRIDEMENT EXTREMITY;  Surgeon: [name], MD;  Location: [facility];  Service: Orthopedics;  Laterality: Right;  . MASS  EXCISION  10/04/2011   Procedure: EXCISION MASS;  Surgeon: Wynonia Sours, MD;  Location: [facility];  Service: Orthopedics;  Laterality: Left;  excision cyst debridment ip joint of left thumb  . PROSTATECTOMY    . ROUX-EN-Y GASTRIC BYPASS  10/10/2010   laparoscopic  . TOE AMPUTATION  02/23/2006   left foot second ray amputation       Home Medications    Prior to Admission medications   Medication Sig Start Date End Date Taking? Authorizing Provider  aspirin EC 81 MG tablet Take 81 mg by mouth daily.    [provider]  atorvastatin (LIPITOR) 10 MG tablet Take 10 mg by mouth daily after  supper.     [provider]  CALCIUM CITRATE PO Take 500 mg by mouth 3 (three) times daily.    [provider]  cephALEXin (KEFLEX) 250 MG capsule Take 1 capsule (250 mg total) by mouth 4 (four) times daily. 07/30/16   Clayton Bibles, PA-C  Cyanocobalamin (B-12) 500 MCG SUBL Place 500 mcg under the tongue every Monday, Wednesday, and Friday.     [provider]  ferrous sulfate 325 (65 FE) MG tablet Take 325 mg by mouth daily with breakfast.    [provider]  fluticasone (FLONASE) 50 MCG/ACT nasal spray Place 2 sprays into both nostrils daily as needed for allergies.     [provider]  ibuprofen (ADVIL,MOTRIN) 200 MG tablet Take 400 mg by mouth every 6 (six) hours as needed for headache (pain).    [provider]  levothyroxine (SYNTHROID, LEVOTHROID) 200 MCG tablet Take 200 mcg by mouth daily before breakfast.  03/07/10   [provider]  metFORMIN (GLUCOPHAGE) 500 MG tablet Take 500 mg by mouth 2 (two) times daily with a meal.    [provider]  Multiple Vitamin (MULTIVITAMIN WITH MINERALS) TABS tablet Take 2 tablets by mouth 2 (two) times daily.    [provider]  Multiple Vitamins-Minerals (PRESERVISION AREDS 2) CAPS Take 1 capsule by mouth 2 (two) times daily.    [provider]  nitroGLYCERIN (NITRODUR - DOSED IN MG/24 HR) 0.2 mg/hr patch Place 1 patch (0.2 mg total) onto the skin daily. 05/01/16   [name], MD  nitroGLYCERIN (NITROSTAT) 0.4 MG SL tablet Place 1 tablet (0.4 mg total) under the tongue every 5 (five) minutes x 3 doses as needed for chest pain. 11/26/14   Jerline Pain, MD  Omega-3 Fatty Acids (FISH OIL TRIPLE STRENGTH PO) Take 1 capsule by mouth daily.    [provider]  Omeprazole 20 MG TBEC Take 20 mg by mouth daily as needed (acid reflux/ indigestion).  03/07/10   [provider]  OVER THE COUNTER MEDICATION Take 1 tablet by mouth 2 (two) times daily. [facility] Joint  Health Complex: glucosamine/cat's claw extract    [provider]  OVER THE COUNTER MEDICATION Take 3 tablets by mouth daily. [facility] Pain Relief Complex: Safflower Extract & Boswellia    [provider]  OVER THE COUNTER MEDICATION Take 2 tablets by mouth at bedtime as needed (sleep). Remfresh - OTC sleep aid    [provider]  oxyCODONE (OXY IR/ROXICODONE) 5 MG immediate release tablet Take 1 tablet (5 mg total) by mouth every 6 (six) hours as needed for breakthrough pain. 04/20/16   Rosita Fire, MD  Probiotic Product (PROBIOTIC FORMULA PO) Take 2 tablets by mouth daily after supper.     [provider]  sildenafil (REVATIO) 20 MG tablet Take 40-100 mg by mouth daily as needed (Erectile Dysfunction).     [provider]  zaleplon (SONATA) 10 MG capsule Take 10 mg by mouth at bedtime as needed for sleep.  03/07/10   [provider]    Family History Family History  Problem Relation Age of Onset  . Heart disease Mother     Social History Social History  Substance Use Topics  . Smoking status: Never Smoker  . Smokeless tobacco: Never Used  . Alcohol use No     Allergies   Bactrim [sulfamethoxazole-trimethoprim]; Moxifloxacin; Penicillins; and Quinolones   Review of Systems Review of Systems  All other systems reviewed and are negative.    Physical Exam Updated Vital Signs BP 118/67 (BP Location: Right Arm)   Pulse 72   Temp 98.3 F (36.8 C) (Oral)   Resp 17   Ht 6\' 8"  (2.032 m)   Wt 101.2 kg (223 lb)   SpO2 100%   BMI 24.50 kg/m   Physical Exam  Constitutional: He appears well-developed and well-nourished. No distress.  HENT:  Head: Normocephalic and atraumatic.  Nose: No mucosal edema or rhinorrhea. Right sinus exhibits frontal sinus tenderness. Right sinus exhibits no maxillary sinus tenderness. Left sinus exhibits frontal sinus tenderness. Left sinus exhibits no maxillary sinus tenderness.    Mouth/Throat: Oropharynx is clear and moist and mucous membranes are normal. No oropharyngeal exudate, posterior oropharyngeal edema, posterior oropharyngeal erythema or tonsillar abscesses.  Eyes: Conjunctivae and EOM are normal. Right eye exhibits no discharge. Left eye exhibits no discharge.  Neck: Normal range of motion. Neck supple.  Cardiovascular: Normal rate and regular rhythm.   Pulmonary/Chest: Effort normal and breath sounds normal. No stridor. No respiratory distress. He has no wheezes. He has no rales.  Abdominal: Soft. He exhibits no distension and no mass. There is no tenderness. There is no rebound and no guarding.  Lymphadenopathy:    He has no cervical adenopathy.  Neurological: He is alert. He exhibits normal muscle tone.  Skin: He is not diaphoretic.  Right foot with chronic wound and callus over plantar aspect with clear fluid dripping from the opening.  No erythema, warmth, tenderness.  There is some mild tenderness without erythema or edema of the left medial calf.    Nursing note and vitals reviewed.    ED Treatments / Results  Labs (all labs ordered are listed, but only abnormal results are displayed) Labs Reviewed  COMPREHENSIVE METABOLIC PANEL - Abnormal; Notable for the following:       Result Value   Potassium 3.4 (*)    Chloride 100 (*)    Glucose, Bld 169 (*)    Calcium 8.8 (*)    Albumin 3.1 (*)    All other components within normal limits  CBC WITH DIFFERENTIAL/PLATELET - Abnormal; Notable for the following:    HCT 38.1 (*)    Lymphs Abs 0.3 (*)    All other components within normal limits  URINALYSIS, ROUTINE W REFLEX MICROSCOPIC - Abnormal; Notable for the following:    Color, Urine ORANGE (*)    Specific Gravity, Urine 1.041 (*)    Bilirubin Urine SMALL (*)    Ketones, ur 40 (*)    Protein, ur 30 (*)    Leukocytes, UA TRACE (*)    All other components within normal limits  URINALYSIS, MICROSCOPIC (REFLEX) - Abnormal; Notable for the  following:    Bacteria, UA MANY (*)    Squamous Epithelial /  LPF 0-5 (*)    All other components within normal limits  CULTURE, BLOOD (ROUTINE X 2)  CULTURE, BLOOD (ROUTINE X 2)  URINE CULTURE  I-STAT CG4 LACTIC ACID, ED    EKG  EKG Interpretation None       Radiology Dg Chest 2 View  Result Date: 07/30/2016 CLINICAL DATA:  Patient with fever of unknown origin. EXAM: CHEST  2 VIEW COMPARISON:  Chest radiograph 09/25/2015. FINDINGS: Stable cardiac and mediastinal contours. No consolidative pulmonary opacities. No pleural effusion or pneumothorax. Thoracic spine degenerative changes. IMPRESSION: No acute cardiopulmonary process. Electronically Signed   By: Lovey Newcomer M.D.   On: 07/30/2016 14:25   Dg Foot Complete Right  Result Date: 07/30/2016 CLINICAL DATA:  Fever and chills. Nonhealing wound plantar surface right foot laterally EXAM: RIGHT FOOT COMPLETE - 3+ VIEW COMPARISON:  Jun 03, 2014 FINDINGS: Frontal, oblique, and lateral views were obtained. There is postoperative change through the medial foot with screw and plate fixation extending from the first metatarsal to the calcaneus. There is a screw transfixing the hindfoot. There is pes planus with Charcot type change throughout the midfoot region. There is fusion in the hindfoot region. There is moderate narrowing of all MTP, PIP, and DIP joints. There are flexion deformities of all PIP joints. No acute fracture or dislocation. No osteomyelitis is appreciable by radiography. No soft tissue air noted. IMPRESSION: Extensive postoperative change. Pes planus. Diffuse Charcot type change throughout the midfoot with fragmentation, progressed from prior study. There is coalition in the hindfoot region with fusion of the all subtalar joints. No osteomyelitis evident. It should be noted that subtle osteomyelitis could easily be obscured in the presence of this degree of Charcot type change. Osteoarthritic change in multiple distal joints. Bones  diffusely osteoporotic. No soft tissue abscess evident by radiography. Electronically Signed   By: Lowella Grip III M.D.   On: 07/30/2016 15:28    Procedures Procedures (including critical care time)  Medications Ordered in ED Medications - No data to display   Initial Impression / Assessment and Plan / ED Course  I have reviewed the triage vital signs and the nursing notes.  Pertinent labs & imaging results that were available during my care of the patient were reviewed by me and considered in my medical decision making (see chart for details).    Pt with hx fever x 4 days, afebrile in ED, nontoxic appearing, no additional symptoms with exception of frontal sinusitis that is mild.  Pt does have right foot chronic wound that he states is improving.  There is clear discharge from the wound, does not appear cellulitis.  No purulence noted by pt or family or me.  Blood cultures and urine culture pending.  UA with bacteria, no other concerning features.  Pt is feeling well and wants to go home.  Abdominal exam is benign.  Discussed pt with Dr Canary Brim.  D/C home with keflex to cover possible UTI and possible sinusitis, less likely foot infection.  Pt has multiple allergies to abx.  Close PCP follow up, return precautions discusses.  Pt will see the surgeon who manages his right foot wound tomorrow.    Final Clinical Impressions(s) / ED Diagnoses   Final diagnoses:  Hx of fever  Bacteriuria  Acute non-recurrent frontal sinusitis    New Prescriptions New Prescriptions   CEPHALEXIN (KEFLEX) 250 MG CAPSULE    Take 1 capsule (250 mg total) by mouth 4 (four) times daily.     Clayton Bibles,  PA-C 07/30/16 1657    Alfonzo Beers, MD 08/03/16 [phone]

## 2016-07-31 DIAGNOSIS — E11621 Type 2 diabetes mellitus with foot ulcer: Secondary | ICD-10-CM | POA: Diagnosis not present

## 2016-07-31 DIAGNOSIS — L97415 Non-pressure chronic ulcer of right heel and midfoot with muscle involvement without evidence of necrosis: Secondary | ICD-10-CM | POA: Diagnosis not present

## 2016-07-31 DIAGNOSIS — E11610 Type 2 diabetes mellitus with diabetic neuropathic arthropathy: Secondary | ICD-10-CM | POA: Diagnosis not present

## 2016-08-01 LAB — URINE CULTURE: Culture: NO GROWTH

## 2016-08-03 DIAGNOSIS — L03115 Cellulitis of right lower limb: Secondary | ICD-10-CM | POA: Diagnosis not present

## 2016-08-03 DIAGNOSIS — B9620 Unspecified Escherichia coli [E. coli] as the cause of diseases classified elsewhere: Secondary | ICD-10-CM | POA: Diagnosis present

## 2016-08-03 DIAGNOSIS — R001 Bradycardia, unspecified: Secondary | ICD-10-CM | POA: Diagnosis present

## 2016-08-03 DIAGNOSIS — M89571 Osteolysis, right ankle and foot: Secondary | ICD-10-CM | POA: Diagnosis not present

## 2016-08-03 DIAGNOSIS — L97502 Non-pressure chronic ulcer of other part of unspecified foot with fat layer exposed: Secondary | ICD-10-CM | POA: Diagnosis not present

## 2016-08-03 DIAGNOSIS — M14679 Charcot's joint, unspecified ankle and foot: Secondary | ICD-10-CM | POA: Diagnosis present

## 2016-08-03 DIAGNOSIS — R2241 Localized swelling, mass and lump, right lower limb: Secondary | ICD-10-CM | POA: Diagnosis not present

## 2016-08-03 DIAGNOSIS — E1140 Type 2 diabetes mellitus with diabetic neuropathy, unspecified: Secondary | ICD-10-CM | POA: Diagnosis not present

## 2016-08-03 DIAGNOSIS — S91301A Unspecified open wound, right foot, initial encounter: Secondary | ICD-10-CM | POA: Diagnosis not present

## 2016-08-03 DIAGNOSIS — M86171 Other acute osteomyelitis, right ankle and foot: Secondary | ICD-10-CM | POA: Diagnosis not present

## 2016-08-03 DIAGNOSIS — E11621 Type 2 diabetes mellitus with foot ulcer: Secondary | ICD-10-CM | POA: Diagnosis not present

## 2016-08-03 DIAGNOSIS — Z981 Arthrodesis status: Secondary | ICD-10-CM | POA: Diagnosis not present

## 2016-08-03 DIAGNOSIS — Z955 Presence of coronary angioplasty implant and graft: Secondary | ICD-10-CM | POA: Diagnosis not present

## 2016-08-03 DIAGNOSIS — Z7982 Long term (current) use of aspirin: Secondary | ICD-10-CM | POA: Diagnosis not present

## 2016-08-03 DIAGNOSIS — I10 Essential (primary) hypertension: Secondary | ICD-10-CM | POA: Diagnosis not present

## 2016-08-03 DIAGNOSIS — M8580 Other specified disorders of bone density and structure, unspecified site: Secondary | ICD-10-CM | POA: Diagnosis not present

## 2016-08-03 DIAGNOSIS — R509 Fever, unspecified: Secondary | ICD-10-CM | POA: Diagnosis not present

## 2016-08-03 DIAGNOSIS — Z89429 Acquired absence of other toe(s), unspecified side: Secondary | ICD-10-CM | POA: Diagnosis not present

## 2016-08-03 DIAGNOSIS — M79671 Pain in right foot: Secondary | ICD-10-CM | POA: Diagnosis not present

## 2016-08-03 DIAGNOSIS — E11628 Type 2 diabetes mellitus with other skin complications: Secondary | ICD-10-CM | POA: Diagnosis present

## 2016-08-03 DIAGNOSIS — M868X7 Other osteomyelitis, ankle and foot: Secondary | ICD-10-CM | POA: Diagnosis not present

## 2016-08-03 DIAGNOSIS — Z89421 Acquired absence of other right toe(s): Secondary | ICD-10-CM | POA: Diagnosis not present

## 2016-08-03 DIAGNOSIS — E1169 Type 2 diabetes mellitus with other specified complication: Secondary | ICD-10-CM | POA: Diagnosis not present

## 2016-08-03 DIAGNOSIS — I25810 Atherosclerosis of coronary artery bypass graft(s) without angina pectoris: Secondary | ICD-10-CM | POA: Diagnosis not present

## 2016-08-03 DIAGNOSIS — I110 Hypertensive heart disease with heart failure: Secondary | ICD-10-CM | POA: Diagnosis present

## 2016-08-03 DIAGNOSIS — M898X7 Other specified disorders of bone, ankle and foot: Secondary | ICD-10-CM | POA: Diagnosis not present

## 2016-08-03 DIAGNOSIS — E785 Hyperlipidemia, unspecified: Secondary | ICD-10-CM | POA: Diagnosis present

## 2016-08-03 DIAGNOSIS — L97509 Non-pressure chronic ulcer of other part of unspecified foot with unspecified severity: Secondary | ICD-10-CM | POA: Diagnosis not present

## 2016-08-03 DIAGNOSIS — M7989 Other specified soft tissue disorders: Secondary | ICD-10-CM | POA: Diagnosis not present

## 2016-08-03 DIAGNOSIS — M86671 Other chronic osteomyelitis, right ankle and foot: Secondary | ICD-10-CM | POA: Diagnosis not present

## 2016-08-03 DIAGNOSIS — M869 Osteomyelitis, unspecified: Secondary | ICD-10-CM | POA: Diagnosis not present

## 2016-08-03 DIAGNOSIS — A5216 Charcot's arthropathy (tabetic): Secondary | ICD-10-CM | POA: Diagnosis not present

## 2016-08-03 DIAGNOSIS — I872 Venous insufficiency (chronic) (peripheral): Secondary | ICD-10-CM | POA: Diagnosis not present

## 2016-08-03 DIAGNOSIS — E11610 Type 2 diabetes mellitus with diabetic neuropathic arthropathy: Secondary | ICD-10-CM | POA: Diagnosis not present

## 2016-08-03 DIAGNOSIS — L97519 Non-pressure chronic ulcer of other part of right foot with unspecified severity: Secondary | ICD-10-CM | POA: Diagnosis not present

## 2016-08-03 DIAGNOSIS — I509 Heart failure, unspecified: Secondary | ICD-10-CM | POA: Diagnosis not present

## 2016-08-03 DIAGNOSIS — J029 Acute pharyngitis, unspecified: Secondary | ICD-10-CM | POA: Diagnosis not present

## 2016-08-03 DIAGNOSIS — I2510 Atherosclerotic heart disease of native coronary artery without angina pectoris: Secondary | ICD-10-CM | POA: Diagnosis not present

## 2016-08-03 DIAGNOSIS — B952 Enterococcus as the cause of diseases classified elsewhere: Secondary | ICD-10-CM | POA: Diagnosis present

## 2016-08-03 DIAGNOSIS — R338 Other retention of urine: Secondary | ICD-10-CM | POA: Diagnosis present

## 2016-08-03 DIAGNOSIS — Z961 Presence of intraocular lens: Secondary | ICD-10-CM | POA: Diagnosis present

## 2016-08-03 DIAGNOSIS — K5900 Constipation, unspecified: Secondary | ICD-10-CM | POA: Diagnosis not present

## 2016-08-03 DIAGNOSIS — N390 Urinary tract infection, site not specified: Secondary | ICD-10-CM | POA: Diagnosis not present

## 2016-08-03 DIAGNOSIS — E039 Hypothyroidism, unspecified: Secondary | ICD-10-CM | POA: Diagnosis not present

## 2016-08-03 DIAGNOSIS — Z951 Presence of aortocoronary bypass graft: Secondary | ICD-10-CM | POA: Diagnosis not present

## 2016-08-03 DIAGNOSIS — I5032 Chronic diastolic (congestive) heart failure: Secondary | ICD-10-CM | POA: Diagnosis not present

## 2016-08-03 DIAGNOSIS — B9561 Methicillin susceptible Staphylococcus aureus infection as the cause of diseases classified elsewhere: Secondary | ICD-10-CM | POA: Diagnosis not present

## 2016-08-03 DIAGNOSIS — N401 Enlarged prostate with lower urinary tract symptoms: Secondary | ICD-10-CM | POA: Diagnosis present

## 2016-08-03 DIAGNOSIS — Z9884 Bariatric surgery status: Secondary | ICD-10-CM | POA: Diagnosis not present

## 2016-08-05 LAB — CULTURE, BLOOD (ROUTINE X 2)
Culture: NO GROWTH
Culture: NO GROWTH
Special Requests: ADEQUATE
Special Requests: ADEQUATE

## 2016-08-07 ENCOUNTER — Ambulatory Visit: Payer: Medicare Other | Admitting: Cardiology

## 2016-08-12 DIAGNOSIS — E11621 Type 2 diabetes mellitus with foot ulcer: Secondary | ICD-10-CM | POA: Diagnosis not present

## 2016-08-12 DIAGNOSIS — M86171 Other acute osteomyelitis, right ankle and foot: Secondary | ICD-10-CM | POA: Diagnosis not present

## 2016-08-12 DIAGNOSIS — Z452 Encounter for adjustment and management of vascular access device: Secondary | ICD-10-CM | POA: Diagnosis not present

## 2016-08-12 DIAGNOSIS — E1169 Type 2 diabetes mellitus with other specified complication: Secondary | ICD-10-CM | POA: Diagnosis not present

## 2016-08-12 DIAGNOSIS — Z5181 Encounter for therapeutic drug level monitoring: Secondary | ICD-10-CM | POA: Diagnosis not present

## 2016-08-12 DIAGNOSIS — L97512 Non-pressure chronic ulcer of other part of right foot with fat layer exposed: Secondary | ICD-10-CM | POA: Diagnosis not present

## 2016-08-14 DIAGNOSIS — M86171 Other acute osteomyelitis, right ankle and foot: Secondary | ICD-10-CM | POA: Diagnosis not present

## 2016-08-14 DIAGNOSIS — Z452 Encounter for adjustment and management of vascular access device: Secondary | ICD-10-CM | POA: Diagnosis not present

## 2016-08-14 DIAGNOSIS — E11621 Type 2 diabetes mellitus with foot ulcer: Secondary | ICD-10-CM | POA: Diagnosis not present

## 2016-08-14 DIAGNOSIS — E1169 Type 2 diabetes mellitus with other specified complication: Secondary | ICD-10-CM | POA: Diagnosis not present

## 2016-08-14 DIAGNOSIS — L97512 Non-pressure chronic ulcer of other part of right foot with fat layer exposed: Secondary | ICD-10-CM | POA: Diagnosis not present

## 2016-08-14 DIAGNOSIS — Z5181 Encounter for therapeutic drug level monitoring: Secondary | ICD-10-CM | POA: Diagnosis not present

## 2016-08-21 DIAGNOSIS — Z5181 Encounter for therapeutic drug level monitoring: Secondary | ICD-10-CM | POA: Diagnosis not present

## 2016-08-21 DIAGNOSIS — E1169 Type 2 diabetes mellitus with other specified complication: Secondary | ICD-10-CM | POA: Diagnosis not present

## 2016-08-21 DIAGNOSIS — E11621 Type 2 diabetes mellitus with foot ulcer: Secondary | ICD-10-CM | POA: Diagnosis not present

## 2016-08-21 DIAGNOSIS — M86171 Other acute osteomyelitis, right ankle and foot: Secondary | ICD-10-CM | POA: Diagnosis not present

## 2016-08-21 DIAGNOSIS — Z452 Encounter for adjustment and management of vascular access device: Secondary | ICD-10-CM | POA: Diagnosis not present

## 2016-08-21 DIAGNOSIS — L97512 Non-pressure chronic ulcer of other part of right foot with fat layer exposed: Secondary | ICD-10-CM | POA: Diagnosis not present

## 2016-08-22 DIAGNOSIS — M869 Osteomyelitis, unspecified: Secondary | ICD-10-CM | POA: Diagnosis not present

## 2016-08-22 DIAGNOSIS — Z792 Long term (current) use of antibiotics: Secondary | ICD-10-CM | POA: Diagnosis not present

## 2016-08-22 DIAGNOSIS — L97509 Non-pressure chronic ulcer of other part of unspecified foot with unspecified severity: Secondary | ICD-10-CM | POA: Diagnosis not present

## 2016-08-22 DIAGNOSIS — Z4800 Encounter for change or removal of nonsurgical wound dressing: Secondary | ICD-10-CM | POA: Diagnosis not present

## 2016-08-22 DIAGNOSIS — E1169 Type 2 diabetes mellitus with other specified complication: Secondary | ICD-10-CM | POA: Diagnosis not present

## 2016-08-22 DIAGNOSIS — Z959 Presence of cardiac and vascular implant and graft, unspecified: Secondary | ICD-10-CM | POA: Diagnosis not present

## 2016-08-22 DIAGNOSIS — E11621 Type 2 diabetes mellitus with foot ulcer: Secondary | ICD-10-CM | POA: Diagnosis not present

## 2016-08-28 DIAGNOSIS — Z5181 Encounter for therapeutic drug level monitoring: Secondary | ICD-10-CM | POA: Diagnosis not present

## 2016-08-28 DIAGNOSIS — E1169 Type 2 diabetes mellitus with other specified complication: Secondary | ICD-10-CM | POA: Diagnosis not present

## 2016-08-28 DIAGNOSIS — E789 Disorder of lipoprotein metabolism, unspecified: Secondary | ICD-10-CM | POA: Diagnosis not present

## 2016-08-28 DIAGNOSIS — E11621 Type 2 diabetes mellitus with foot ulcer: Secondary | ICD-10-CM | POA: Diagnosis not present

## 2016-08-28 DIAGNOSIS — Z452 Encounter for adjustment and management of vascular access device: Secondary | ICD-10-CM | POA: Diagnosis not present

## 2016-08-28 DIAGNOSIS — L97512 Non-pressure chronic ulcer of other part of right foot with fat layer exposed: Secondary | ICD-10-CM | POA: Diagnosis not present

## 2016-08-28 DIAGNOSIS — R700 Elevated erythrocyte sedimentation rate: Secondary | ICD-10-CM | POA: Diagnosis not present

## 2016-08-28 DIAGNOSIS — M86171 Other acute osteomyelitis, right ankle and foot: Secondary | ICD-10-CM | POA: Diagnosis not present

## 2016-08-28 DIAGNOSIS — R1031 Right lower quadrant pain: Secondary | ICD-10-CM | POA: Diagnosis not present

## 2016-09-04 DIAGNOSIS — Z5181 Encounter for therapeutic drug level monitoring: Secondary | ICD-10-CM | POA: Diagnosis not present

## 2016-09-04 DIAGNOSIS — E11621 Type 2 diabetes mellitus with foot ulcer: Secondary | ICD-10-CM | POA: Diagnosis not present

## 2016-09-04 DIAGNOSIS — E1169 Type 2 diabetes mellitus with other specified complication: Secondary | ICD-10-CM | POA: Diagnosis not present

## 2016-09-04 DIAGNOSIS — Z452 Encounter for adjustment and management of vascular access device: Secondary | ICD-10-CM | POA: Diagnosis not present

## 2016-09-04 DIAGNOSIS — M86171 Other acute osteomyelitis, right ankle and foot: Secondary | ICD-10-CM | POA: Diagnosis not present

## 2016-09-04 DIAGNOSIS — L97512 Non-pressure chronic ulcer of other part of right foot with fat layer exposed: Secondary | ICD-10-CM | POA: Diagnosis not present

## 2016-09-05 DIAGNOSIS — Z882 Allergy status to sulfonamides status: Secondary | ICD-10-CM | POA: Diagnosis not present

## 2016-09-05 DIAGNOSIS — Z7982 Long term (current) use of aspirin: Secondary | ICD-10-CM | POA: Diagnosis not present

## 2016-09-05 DIAGNOSIS — Z23 Encounter for immunization: Secondary | ICD-10-CM | POA: Diagnosis not present

## 2016-09-05 DIAGNOSIS — L97419 Non-pressure chronic ulcer of right heel and midfoot with unspecified severity: Secondary | ICD-10-CM | POA: Diagnosis not present

## 2016-09-05 DIAGNOSIS — M859 Disorder of bone density and structure, unspecified: Secondary | ICD-10-CM | POA: Diagnosis not present

## 2016-09-05 DIAGNOSIS — E1169 Type 2 diabetes mellitus with other specified complication: Secondary | ICD-10-CM | POA: Diagnosis not present

## 2016-09-05 DIAGNOSIS — Z888 Allergy status to other drugs, medicaments and biological substances status: Secondary | ICD-10-CM | POA: Diagnosis not present

## 2016-09-05 DIAGNOSIS — Z89511 Acquired absence of right leg below knee: Secondary | ICD-10-CM | POA: Diagnosis not present

## 2016-09-05 DIAGNOSIS — Z792 Long term (current) use of antibiotics: Secondary | ICD-10-CM | POA: Diagnosis not present

## 2016-09-05 DIAGNOSIS — L97416 Non-pressure chronic ulcer of right heel and midfoot with bone involvement without evidence of necrosis: Secondary | ICD-10-CM | POA: Diagnosis not present

## 2016-09-05 DIAGNOSIS — Z79899 Other long term (current) drug therapy: Secondary | ICD-10-CM | POA: Diagnosis not present

## 2016-09-05 DIAGNOSIS — E11621 Type 2 diabetes mellitus with foot ulcer: Secondary | ICD-10-CM | POA: Diagnosis not present

## 2016-09-05 DIAGNOSIS — E11610 Type 2 diabetes mellitus with diabetic neuropathic arthropathy: Secondary | ICD-10-CM | POA: Diagnosis not present

## 2016-09-05 DIAGNOSIS — Z880 Allergy status to penicillin: Secondary | ICD-10-CM | POA: Diagnosis not present

## 2016-09-05 DIAGNOSIS — M869 Osteomyelitis, unspecified: Secondary | ICD-10-CM | POA: Diagnosis not present

## 2016-09-05 DIAGNOSIS — Z09 Encounter for follow-up examination after completed treatment for conditions other than malignant neoplasm: Secondary | ICD-10-CM | POA: Diagnosis not present

## 2016-09-05 DIAGNOSIS — Z7984 Long term (current) use of oral hypoglycemic drugs: Secondary | ICD-10-CM | POA: Diagnosis not present

## 2016-09-05 DIAGNOSIS — Z9889 Other specified postprocedural states: Secondary | ICD-10-CM | POA: Diagnosis not present

## 2016-09-05 DIAGNOSIS — Z881 Allergy status to other antibiotic agents status: Secondary | ICD-10-CM | POA: Diagnosis not present

## 2016-09-05 DIAGNOSIS — L97501 Non-pressure chronic ulcer of other part of unspecified foot limited to breakdown of skin: Secondary | ICD-10-CM | POA: Diagnosis not present

## 2016-09-12 DIAGNOSIS — E11621 Type 2 diabetes mellitus with foot ulcer: Secondary | ICD-10-CM | POA: Diagnosis not present

## 2016-09-12 DIAGNOSIS — L97512 Non-pressure chronic ulcer of other part of right foot with fat layer exposed: Secondary | ICD-10-CM | POA: Diagnosis not present

## 2016-09-12 DIAGNOSIS — Z452 Encounter for adjustment and management of vascular access device: Secondary | ICD-10-CM | POA: Diagnosis not present

## 2016-09-12 DIAGNOSIS — E1169 Type 2 diabetes mellitus with other specified complication: Secondary | ICD-10-CM | POA: Diagnosis not present

## 2016-09-12 DIAGNOSIS — M86171 Other acute osteomyelitis, right ankle and foot: Secondary | ICD-10-CM | POA: Diagnosis not present

## 2016-09-12 DIAGNOSIS — Z5181 Encounter for therapeutic drug level monitoring: Secondary | ICD-10-CM | POA: Diagnosis not present

## 2016-09-19 ENCOUNTER — Ambulatory Visit: Payer: Medicare Other | Admitting: Cardiology

## 2016-09-19 DIAGNOSIS — L97512 Non-pressure chronic ulcer of other part of right foot with fat layer exposed: Secondary | ICD-10-CM | POA: Diagnosis not present

## 2016-09-19 DIAGNOSIS — M86171 Other acute osteomyelitis, right ankle and foot: Secondary | ICD-10-CM | POA: Diagnosis not present

## 2016-09-19 DIAGNOSIS — M869 Osteomyelitis, unspecified: Secondary | ICD-10-CM | POA: Diagnosis not present

## 2016-09-19 DIAGNOSIS — Z452 Encounter for adjustment and management of vascular access device: Secondary | ICD-10-CM | POA: Diagnosis not present

## 2016-09-19 DIAGNOSIS — L97519 Non-pressure chronic ulcer of other part of right foot with unspecified severity: Secondary | ICD-10-CM | POA: Diagnosis not present

## 2016-09-19 DIAGNOSIS — Z5181 Encounter for therapeutic drug level monitoring: Secondary | ICD-10-CM | POA: Diagnosis not present

## 2016-09-19 DIAGNOSIS — E11621 Type 2 diabetes mellitus with foot ulcer: Secondary | ICD-10-CM | POA: Diagnosis not present

## 2016-09-19 DIAGNOSIS — L97509 Non-pressure chronic ulcer of other part of unspecified foot with unspecified severity: Secondary | ICD-10-CM | POA: Diagnosis not present

## 2016-09-19 DIAGNOSIS — E1169 Type 2 diabetes mellitus with other specified complication: Secondary | ICD-10-CM | POA: Diagnosis not present

## 2016-09-27 DIAGNOSIS — L97419 Non-pressure chronic ulcer of right heel and midfoot with unspecified severity: Secondary | ICD-10-CM | POA: Diagnosis present

## 2016-09-27 DIAGNOSIS — J309 Allergic rhinitis, unspecified: Secondary | ICD-10-CM | POA: Diagnosis present

## 2016-09-27 DIAGNOSIS — Z8546 Personal history of malignant neoplasm of prostate: Secondary | ICD-10-CM | POA: Diagnosis not present

## 2016-09-27 DIAGNOSIS — G8918 Other acute postprocedural pain: Secondary | ICD-10-CM | POA: Diagnosis not present

## 2016-09-27 DIAGNOSIS — Z880 Allergy status to penicillin: Secondary | ICD-10-CM | POA: Diagnosis not present

## 2016-09-27 DIAGNOSIS — E785 Hyperlipidemia, unspecified: Secondary | ICD-10-CM | POA: Diagnosis present

## 2016-09-27 DIAGNOSIS — I110 Hypertensive heart disease with heart failure: Secondary | ICD-10-CM | POA: Diagnosis present

## 2016-09-27 DIAGNOSIS — Z7951 Long term (current) use of inhaled steroids: Secondary | ICD-10-CM | POA: Diagnosis not present

## 2016-09-27 DIAGNOSIS — I252 Old myocardial infarction: Secondary | ICD-10-CM | POA: Diagnosis not present

## 2016-09-27 DIAGNOSIS — Z9884 Bariatric surgery status: Secondary | ICD-10-CM | POA: Diagnosis not present

## 2016-09-27 DIAGNOSIS — E11621 Type 2 diabetes mellitus with foot ulcer: Secondary | ICD-10-CM | POA: Diagnosis not present

## 2016-09-27 DIAGNOSIS — G4733 Obstructive sleep apnea (adult) (pediatric): Secondary | ICD-10-CM | POA: Diagnosis present

## 2016-09-27 DIAGNOSIS — Z888 Allergy status to other drugs, medicaments and biological substances status: Secondary | ICD-10-CM | POA: Diagnosis not present

## 2016-09-27 DIAGNOSIS — Z7982 Long term (current) use of aspirin: Secondary | ICD-10-CM | POA: Diagnosis not present

## 2016-09-27 DIAGNOSIS — I5032 Chronic diastolic (congestive) heart failure: Secondary | ICD-10-CM | POA: Diagnosis present

## 2016-09-27 DIAGNOSIS — G546 Phantom limb syndrome with pain: Secondary | ICD-10-CM | POA: Diagnosis not present

## 2016-09-27 DIAGNOSIS — E039 Hypothyroidism, unspecified: Secondary | ICD-10-CM | POA: Diagnosis present

## 2016-09-27 DIAGNOSIS — Z7984 Long term (current) use of oral hypoglycemic drugs: Secondary | ICD-10-CM | POA: Diagnosis not present

## 2016-09-27 DIAGNOSIS — M792 Neuralgia and neuritis, unspecified: Secondary | ICD-10-CM | POA: Diagnosis not present

## 2016-09-27 DIAGNOSIS — I2510 Atherosclerotic heart disease of native coronary artery without angina pectoris: Secondary | ICD-10-CM | POA: Diagnosis present

## 2016-09-27 DIAGNOSIS — Z79899 Other long term (current) drug therapy: Secondary | ICD-10-CM | POA: Diagnosis not present

## 2016-09-27 DIAGNOSIS — E11610 Type 2 diabetes mellitus with diabetic neuropathic arthropathy: Secondary | ICD-10-CM | POA: Diagnosis present

## 2016-09-27 DIAGNOSIS — S91301A Unspecified open wound, right foot, initial encounter: Secondary | ICD-10-CM | POA: Diagnosis not present

## 2016-09-27 DIAGNOSIS — Z955 Presence of coronary angioplasty implant and graft: Secondary | ICD-10-CM | POA: Diagnosis not present

## 2016-09-27 DIAGNOSIS — Z882 Allergy status to sulfonamides status: Secondary | ICD-10-CM | POA: Diagnosis not present

## 2016-09-27 DIAGNOSIS — K219 Gastro-esophageal reflux disease without esophagitis: Secondary | ICD-10-CM | POA: Diagnosis present

## 2016-09-27 DIAGNOSIS — L97414 Non-pressure chronic ulcer of right heel and midfoot with necrosis of bone: Secondary | ICD-10-CM | POA: Diagnosis not present

## 2016-09-27 DIAGNOSIS — I453 Trifascicular block: Secondary | ICD-10-CM | POA: Diagnosis present

## 2016-09-27 DIAGNOSIS — Z89511 Acquired absence of right leg below knee: Secondary | ICD-10-CM | POA: Diagnosis not present

## 2016-09-30 DIAGNOSIS — Z89511 Acquired absence of right leg below knee: Secondary | ICD-10-CM | POA: Insufficient documentation

## 2016-10-03 DIAGNOSIS — Z89511 Acquired absence of right leg below knee: Secondary | ICD-10-CM | POA: Diagnosis not present

## 2016-10-03 DIAGNOSIS — Z4781 Encounter for orthopedic aftercare following surgical amputation: Secondary | ICD-10-CM | POA: Diagnosis not present

## 2016-10-03 DIAGNOSIS — S80821D Blister (nonthermal), right lower leg, subsequent encounter: Secondary | ICD-10-CM | POA: Diagnosis not present

## 2016-10-10 DIAGNOSIS — Z4781 Encounter for orthopedic aftercare following surgical amputation: Secondary | ICD-10-CM | POA: Diagnosis not present

## 2016-10-10 DIAGNOSIS — Z89511 Acquired absence of right leg below knee: Secondary | ICD-10-CM | POA: Diagnosis not present

## 2016-10-10 DIAGNOSIS — S80821D Blister (nonthermal), right lower leg, subsequent encounter: Secondary | ICD-10-CM | POA: Diagnosis not present

## 2016-10-17 DIAGNOSIS — Z4802 Encounter for removal of sutures: Secondary | ICD-10-CM | POA: Diagnosis not present

## 2016-10-17 DIAGNOSIS — Z4781 Encounter for orthopedic aftercare following surgical amputation: Secondary | ICD-10-CM | POA: Diagnosis not present

## 2016-10-17 DIAGNOSIS — Z89511 Acquired absence of right leg below knee: Secondary | ICD-10-CM | POA: Diagnosis not present

## 2016-11-03 DIAGNOSIS — I10 Essential (primary) hypertension: Secondary | ICD-10-CM | POA: Diagnosis not present

## 2016-11-03 DIAGNOSIS — E1165 Type 2 diabetes mellitus with hyperglycemia: Secondary | ICD-10-CM | POA: Diagnosis not present

## 2016-11-03 DIAGNOSIS — Z7984 Long term (current) use of oral hypoglycemic drugs: Secondary | ICD-10-CM | POA: Diagnosis not present

## 2016-11-03 DIAGNOSIS — E119 Type 2 diabetes mellitus without complications: Secondary | ICD-10-CM | POA: Diagnosis not present

## 2016-12-06 ENCOUNTER — Ambulatory Visit (INDEPENDENT_AMBULATORY_CARE_PROVIDER_SITE_OTHER): Payer: Medicare Other | Admitting: Cardiology

## 2016-12-06 ENCOUNTER — Encounter: Payer: Self-pay | Admitting: Cardiology

## 2016-12-06 VITALS — BP 140/82 | HR 65 | Resp 16 | Ht >= 80 in | Wt 221.8 lb

## 2016-12-06 DIAGNOSIS — I452 Bifascicular block: Secondary | ICD-10-CM

## 2016-12-06 DIAGNOSIS — I2510 Atherosclerotic heart disease of native coronary artery without angina pectoris: Secondary | ICD-10-CM

## 2016-12-06 DIAGNOSIS — E782 Mixed hyperlipidemia: Secondary | ICD-10-CM

## 2016-12-06 MED ORDER — LISINOPRIL 5 MG PO TABS: 5.0000 mg | ORAL_TABLET | Freq: Every day | ORAL | 3 refills | Status: DC

## 2016-12-06 MED ORDER — NITROGLYCERIN 0.4 MG SL SUBL: 0.4000 mg | SUBLINGUAL_TABLET | SUBLINGUAL | 11 refills | Status: DC | PRN

## 2016-12-06 NOTE — Progress Notes (Signed)
[facility]. [address], [address], [place]  [address] Phone: [phone] Fax:  [phone]  Date:  12/06/2016   ID:  [name], DOB 05/31/1950, MRN [id number]  PCP:  [name], MD   History of Present Illness: [name] is a 66 y.o. male with coronary artery disease former patient of Dr. [name] with prior stent to ramus in 2006, proximal LAD x 2. His distal RCA was diffusely diseased.   There was a fixed inferior defect in 2012 on nuclear stress test. Also has diabetes hypertension hyperlipidemia and sleep apnea. On October 10, 2010 he underwent gastric bypass surgery by Dr. [name] Done. Excellent weight loss of approximately 130 pounds. He is off of his diabetic medications, he has decreased his Lipitor. He is not on any blood pressure medications.  He continues to have first degree A-V block, bifascicular block, sinus bradycardia. This has been present for several years. No high-risk symptoms such as syncope, significant shortness of breath or angina with exercise. He is to continue to monitor this and that it may result in a pacemaker in the future. During surgery, his heart rate was in the 30s at times. Anesthesiologist noted this. I observed another EKG showing heart rate of 48 with bifascicular block. Once again no high-risk symptoms. We have been watching this.  He states that he has had a heart murmur since childhood, mild mitral regurgitation on last echocardiogram in 2011.Doing well, no change in symptoms.  Has had his right lower extremity amputated after long-standing history of infection.   Excellent lipids.   12/06/16-overall doing well, no syncope, no shortness of breath, no orthopnea, no PND, no fever.  Post amputation right lower extremity after ongoing infection.  Using scooter for ambulation.  Wt Readings from Last 3 Encounters:  12/06/16 221 lb 12.8 oz (100.6 kg)  07/30/16 223 lb (101.2 kg)  07/25/16 224 lb (101.6 kg)     Past Medical History:    Diagnosis Date  . Coronary artery disease    a. remote stenting to ramus, prox LAD x2.  . Dental crowns present   . Diabetes mellitus    diet-controlled  . First degree AV block   . GERD (gastroesophageal reflux disease)   . Heart attack ([place]) 06/2004  . Heart murmur    aortic  . Hx MRSA infection 02/2006  . Hyperlipidemia   . Hypertension    hx. of - has not been on med. since losing wt. after gastric bypass  . Hypothyroidism   . Morbid obesity ([place])   . Mucoid cyst of joint 09/2011   left thumb  . Sleep apnea    sleep study 03/29/2011; no CPAP use  . Trifascicular block     Past Surgical History:  Procedure Laterality Date  . CATARACT EXTRACTION  02/2010; 03/2010  . CORONARY ANGIOPLASTY WITH STENT PLACEMENT  07/12/2004; 08/03/2004   total of 3 stents  . PROSTATECTOMY    . ROUX-EN-Y GASTRIC BYPASS  10/10/2010   laparoscopic  . TOE AMPUTATION  02/23/2006   left foot second ray amputation    Current Outpatient Medications  Medication Sig Dispense Refill  . aspirin EC 81 MG tablet Take 81 mg by mouth daily.    Marland Kitchen atorvastatin (LIPITOR) 10 MG tablet Take 10 mg by mouth daily after supper.     Marland Kitchen CALCIUM CITRATE PO Take 500 mg by mouth 3 (three) times daily.    . Cyanocobalamin (B-12) 500 MCG SUBL Place 500 mcg under  the tongue every Monday, Wednesday, and Friday.     . ferrous sulfate 325 (65 FE) MG tablet Take 325 mg by mouth daily with breakfast.    . fluticasone (FLONASE) 50 MCG/ACT nasal spray Place 2 sprays into both nostrils daily as needed for allergies.     Marland Kitchen ibuprofen (ADVIL,MOTRIN) 200 MG tablet Take 400 mg by mouth every 6 (six) hours as needed for headache (pain).    Marland Kitchen levothyroxine (SYNTHROID, LEVOTHROID) 200 MCG tablet Take 200 mcg by mouth daily before breakfast.     . metFORMIN (GLUCOPHAGE) 500 MG tablet Take 500 mg by mouth 2 (two) times daily with a meal.    . Multiple Vitamin (MULTIVITAMIN WITH MINERALS) TABS tablet Take 2 tablets by mouth 2 (two) times daily.    .  Multiple Vitamins-Minerals (PRESERVISION AREDS 2) CAPS Take 1 capsule by mouth 2 (two) times daily.    . nitroGLYCERIN (NITROSTAT) 0.4 MG SL tablet Place 1 tablet (0.4 mg total) under the tongue every 5 (five) minutes x 3 doses as needed for chest pain. 25 tablet 5  . Omega-3 Fatty Acids (FISH OIL TRIPLE STRENGTH PO) Take 1 capsule by mouth daily.    . Omeprazole 20 MG TBEC Take 20 mg by mouth daily as needed (acid reflux/ indigestion).     Marland Kitchen OVER THE COUNTER MEDICATION Take 1 tablet by mouth 2 (two) times daily. [facility] Joint Health Complex: glucosamine/cat's claw extract    . OVER THE COUNTER MEDICATION Take 3 tablets by mouth daily. [facility] Pain Relief Complex: Safflower Extract & Boswellia    . OVER THE COUNTER MEDICATION Take 2 tablets by mouth at bedtime as needed (sleep). Remfresh - OTC sleep aid    . Probiotic Product (PROBIOTIC FORMULA PO) Take 2 tablets by mouth daily after supper.     . sildenafil (REVATIO) 20 MG tablet Take 40-100 mg by mouth daily as needed (Erectile Dysfunction).     . zaleplon (SONATA) 10 MG capsule Take 10 mg by mouth at bedtime as needed for sleep.      No current facility-administered medications for this visit.     Allergies:    Allergies  Allergen Reactions  . Bactrim [Sulfamethoxazole-Trimethoprim] Itching and Rash  . Moxifloxacin Rash  . Penicillins Rash     Has patient had a PCN reaction causing immediate rash, facial/tongue/throat swelling, SOB or lightheadedness with hypotension: #  #  #  YES  #  #  #  Has patient had a PCN reaction causing severe rash involving mucus membranes or skin necrosis: No Has patient had a PCN reaction that required hospitalization unknown Has patient had a PCN reaction occurring within the last 10 years: No If all of the above answers are "NO", then may proceed with Cephalosporin use.  . Quinolones Itching    Social History:  The patient  reports that  has never smoked. he has never used smokeless tobacco. He reports  that he does not drink alcohol or use drugs.   Family History  Problem Relation Age of Onset  . Heart disease Mother     ROS:  Please see the history of present illness.   Denies any fevers, chills, orthopnea, PND   All other systems reviewed and negative.   PHYSICAL EXAM: VS:  BP 140/82   Pulse 65   Resp 16   Ht 6\' 8"  (2.032 m)   Wt 221 lb 12.8 oz (100.6 kg)   SpO2 97%   BMI 24.37 kg/m   GEN:  Well nourished, well developed, in no acute distress  HEENT: normal  Neck: no JVD, carotid bruits, or masses Cardiac: RRR; 2/6 HSM with radiation to carotids, no rubs, or gallops,no edema  Respiratory:  clear to auscultation bilaterally, normal work of breathing GI: soft, nontender, nondistended, + BS MS: R BKA Skin: warm and dry, no rash Neuro:  Alert and Oriented x 3, Strength and sensation are intact Psych: euthymic mood, full affect    EKG:   08/05/15-sinus bradycardia heart rate 58 with first-degree AV block, right bundle branch block, left anterior fascicular block, bifascicular block personally viewed-prior 08/04/2014 sinus bradycardia with first-degree AV block, right bundle block, left anterior fascicular block, PR interval 252 ms personally viewed, no significant change from prior 07/29/13-sinus rhythm, first degree AV block 242 ms, right bundle branch block, left anterior fascicular block, bifascicular block   Nuclear stress test-2012-low risk, fixed inferior wall defect. No ischemia.  Echocardiogram 2011-normal EF, mild MR, aortic calcification  ECHO 04/19/16:  - Left ventricle: The cavity size was normal. Wall thickness was   increased in a pattern of moderate LVH. Systolic function was   mildly reduced. The estimated ejection fraction was in the range   of 45% to 50%. Inferior hypokinesis. Doppler parameters are   consistent with abnormal left ventricular relaxation (grade 1   diastolic dysfunction). The E/e&' ratio is between 8-15,   suggesting indeterminate LV filling  pressure. - Aortic valve: Trileaflet; mildly calcified leaflets. Mild   stenosis. There was mild regurgitation. Mean gradient (S): 14 mm   Hg. Peak gradient (S): 27 mm Hg. - Aorta: Aortic root dimension: 42 mm (ED). He is 51foot 8in.  - Aortic root: The aortic root is mildly dilated. - Mitral valve: Mildly thickened leaflets . There was mild   regurgitation. - Left atrium: The atrium was normal in size. - Right ventricle: Systolic function is mildly reduced. - Right atrium: The atrium was normal in size. - Inferior vena cava: The vessel was normal in size. The   respirophasic diameter changes were in the normal range (>= 50%),   consistent with normal central venous pressure.  Impressions:  - Compared to a prior study in 2017, the LVEF is slightly worse at   45-50%. There is a suggestion of inferior wall hypokinesis.  Labs-3/15-LDL 63  ASSESSMENT AND PLAN:  1. Coronary artery disease-previous stents (3 total) to ramus, LAD proximal, diffusely diseased RCA. Continuing with current aggressive medical management. Aspirin. No anginal symptoms. Currently not on beta blocker because of this and bifascicular block. Avoid AV nodal blocking agents. Last nuclear stress test as above, low risk with no ischemia. 2. Old myocardial infarction-inferior.  Given his mildly reduced ejection fraction of 45-50%, I will add an ACE inhibitor lisinopril 5 mg once a day.  His creatinine is 0.85.  He was on lisinopril previously. 3. Hyperlipidemia-currently on low-dose Lipitor because of significantly low LDL 62 4. Bifascicular block-no high-risk symptoms such as syncope. Avoiding AV nodal blocking agents.  Doing very well.  No pacemaker.  I am thankful that he did not have pacemaker hardware in place especially with his prior lower extremity wound. 5. Heart murmur-states that he is had a murmur since childhood-prior mitral regurgitation-mild, aortic valve calcification. I do hear radiation of murmur to  carotids.  Mitral regurgitation noted on echocardiogram. 6. Right foot wound-Charcot joint- resulting in amputation, BKA right.  He had extensive wound treatment.  He is going to have a prosthesis soon.  Utilizing a scooter currently.  7. Annual followup  Signed, Candee Furbish, MD [facility]  12/06/2016 9:46 AM

## 2016-12-06 NOTE — Patient Instructions (Addendum)
Medication Instructions:  Please start Lisinopril 5 mg a day. Continue all other medications as listed. Please take care if using Revatio and SL Ntg together.  Follow-Up: Follow up in 1 year with Dr. Marlou Porch.  You will receive a letter in the mail 2 months before you are due.  Please call us when you receive this letter to schedule your follow up appointment.  If you need a refill on your cardiac medications before your next appointment, please call your pharmacy.  Thank you for choosing [facility]!!

## 2016-12-23 DIAGNOSIS — H04123 Dry eye syndrome of bilateral lacrimal glands: Secondary | ICD-10-CM | POA: Insufficient documentation

## 2016-12-25 DIAGNOSIS — Z9841 Cataract extraction status, right eye: Secondary | ICD-10-CM | POA: Diagnosis not present

## 2016-12-25 DIAGNOSIS — Z955 Presence of coronary angioplasty implant and graft: Secondary | ICD-10-CM | POA: Diagnosis not present

## 2016-12-25 DIAGNOSIS — Z9842 Cataract extraction status, left eye: Secondary | ICD-10-CM | POA: Diagnosis not present

## 2016-12-25 DIAGNOSIS — I252 Old myocardial infarction: Secondary | ICD-10-CM | POA: Diagnosis not present

## 2016-12-25 DIAGNOSIS — Z9884 Bariatric surgery status: Secondary | ICD-10-CM | POA: Diagnosis not present

## 2016-12-25 DIAGNOSIS — E119 Type 2 diabetes mellitus without complications: Secondary | ICD-10-CM | POA: Diagnosis not present

## 2016-12-25 DIAGNOSIS — I10 Essential (primary) hypertension: Secondary | ICD-10-CM | POA: Diagnosis not present

## 2016-12-25 DIAGNOSIS — Z9889 Other specified postprocedural states: Secondary | ICD-10-CM | POA: Diagnosis not present

## 2016-12-25 DIAGNOSIS — Z7982 Long term (current) use of aspirin: Secondary | ICD-10-CM | POA: Diagnosis not present

## 2016-12-25 DIAGNOSIS — Z79899 Other long term (current) drug therapy: Secondary | ICD-10-CM | POA: Diagnosis not present

## 2016-12-25 DIAGNOSIS — H01006 Unspecified blepharitis left eye, unspecified eyelid: Secondary | ICD-10-CM | POA: Diagnosis not present

## 2016-12-25 DIAGNOSIS — L918 Other hypertrophic disorders of the skin: Secondary | ICD-10-CM | POA: Diagnosis not present

## 2016-12-25 DIAGNOSIS — Z961 Presence of intraocular lens: Secondary | ICD-10-CM | POA: Diagnosis not present

## 2016-12-25 DIAGNOSIS — Z89511 Acquired absence of right leg below knee: Secondary | ICD-10-CM | POA: Diagnosis not present

## 2016-12-25 DIAGNOSIS — H01003 Unspecified blepharitis right eye, unspecified eyelid: Secondary | ICD-10-CM | POA: Diagnosis not present

## 2016-12-25 DIAGNOSIS — Z8546 Personal history of malignant neoplasm of prostate: Secondary | ICD-10-CM | POA: Diagnosis not present

## 2016-12-25 DIAGNOSIS — H04123 Dry eye syndrome of bilateral lacrimal glands: Secondary | ICD-10-CM | POA: Diagnosis not present

## 2016-12-25 DIAGNOSIS — Z7984 Long term (current) use of oral hypoglycemic drugs: Secondary | ICD-10-CM | POA: Diagnosis not present

## 2016-12-25 DIAGNOSIS — I2510 Atherosclerotic heart disease of native coronary artery without angina pectoris: Secondary | ICD-10-CM | POA: Diagnosis not present

## 2016-12-25 DIAGNOSIS — H029 Unspecified disorder of eyelid: Secondary | ICD-10-CM | POA: Diagnosis not present

## 2016-12-25 DIAGNOSIS — Z880 Allergy status to penicillin: Secondary | ICD-10-CM | POA: Diagnosis not present

## 2016-12-29 DIAGNOSIS — Z9884 Bariatric surgery status: Secondary | ICD-10-CM | POA: Diagnosis not present

## 2017-01-29 ENCOUNTER — Encounter: Payer: Self-pay | Admitting: Physical Therapy

## 2017-01-29 ENCOUNTER — Ambulatory Visit: Payer: Medicare Other | Attending: Plastic Surgery | Admitting: Physical Therapy

## 2017-01-29 DIAGNOSIS — R2689 Other abnormalities of gait and mobility: Secondary | ICD-10-CM | POA: Insufficient documentation

## 2017-01-29 DIAGNOSIS — R2681 Unsteadiness on feet: Secondary | ICD-10-CM | POA: Diagnosis not present

## 2017-01-30 NOTE — Therapy (Signed)
[facility] [address] [place], [place], [address] Phone: [phone]   Fax:  [phone]  Physical Therapy Evaluation  Patient Details  Name: [name] MRN: [id number] Date of Birth: 1950/12/15 Referring Provider: Tressa Busman, DM   Encounter Date: 01/29/2017  PT End of Session - 01/29/17 1543    Visit Number  1    Number of Visits  3    Authorization Type  Medicare/ BCBS 2nd    PT Start Time  1450    PT Stop Time  1530    PT Time Calculation (min)  40 min    Activity Tolerance  Patient tolerated treatment well    Behavior During Therapy  [facility] for tasks assessed/performed       Past Medical History:  Diagnosis Date  . Coronary artery disease    a. remote stenting to ramus, prox LAD x2.  . Dental crowns present   . Diabetes mellitus    diet-controlled  . First degree AV block   . GERD (gastroesophageal reflux disease)   . Heart attack ([place]) 06/2004  . Heart murmur    aortic  . Hx MRSA infection 02/2006  . Hyperlipidemia   . Hypertension    hx. of - has not been on med. since losing wt. after gastric bypass  . Hypothyroidism   . Morbid obesity ([place])   . Mucoid cyst of joint 09/2011   left thumb  . Sleep apnea    sleep study 03/29/2011; no CPAP use  . Trifascicular block     Past Surgical History:  Procedure Laterality Date  . AMPUTATION Left 04/19/2016   Procedure: Left Foot 4th Toe Amputation vs. Transmetatarsal;  Surgeon: [name], MD;  Location: [facility];  Service: Orthopedics;  Laterality: Left;  . CATARACT EXTRACTION  02/2010; 03/2010  . COLONOSCOPY WITH PROPOFOL N/A 09/14/2014   Procedure: COLONOSCOPY WITH PROPOFOL;  Surgeon: Garlan Fair, MD;  Location: [facility];  Service: Endoscopy;  Laterality: N/A;  . CORONARY ANGIOPLASTY WITH STENT PLACEMENT  07/12/2004; 08/03/2004   total of 3 stents  . I&D EXTREMITY Right 06/04/2014   Procedure: IRRIGATION AND DEBRIDEMENT EXTREMITY;  Surgeon: [name], MD;  Location: [facility];  Service: Orthopedics;  Laterality: Right;  . MASS EXCISION  10/04/2011   Procedure: EXCISION MASS;  Surgeon: Wynonia Sours, MD;  Location: [facility];  Service: Orthopedics;  Laterality: Left;  excision cyst debridment ip joint of left thumb  . PROSTATECTOMY    . ROUX-EN-Y GASTRIC BYPASS  10/10/2010   laparoscopic  . TOE AMPUTATION  02/23/2006   left foot second ray amputation    There were no vitals filed for this visit.   Subjective Assessment - 01/29/17 1453    Subjective  This 67yo male was referred to PT by Tressa Busman, MD on 01/15/2017. He underwent a right Transtibial Amputation on 09/27/2016 & history Left Transmetatarsal Amputation 04/09/16. He recieved his first prosthesis 12/22/2016. He presents to PT wearing prosthesis ambulating without device.      Patient is accompained by:  Family member wife Brandis Matsuura    Limitations  Lifting;Walking;Standing;House hold activities    Patient Stated Goals  To do the things he did before.     Currently in Pain?  No/denies         [facility] PT Assessment - 01/29/17 1445      Assessment   Medical Diagnosis  Right Transtibial Amputation    Referring Provider  Tressa Busman, DM    Onset Date/Surgical Date  12/22/16 prosthesis delivery    Hand Dominance  Right      Precautions   Precautions  Fall      Balance Screen   Has the patient fallen in the past 6 months  No    Has the patient had a decrease in activity level because of a fear of falling?   No    Is the patient reluctant to leave their home because of a fear of falling?   No      Home Social worker  Private residence    Living Arrangements  Spouse/significant other    Type of [place] to enter    Entrance Stairs-Number of Steps  1    Entrance Stairs-Rails  None    Home Layout  Two level;Able to live on main level with bedroom/bathroom;Full bath on main level upstairs spare bedroom     Alternate Level Stairs-Number of Steps  14    Alternate Level Stairs-Rails  Right partial on left also    [place] - single point;Shower seat;Shower seat - built in      Prior Function   Level of Independence  Independent;Independent with household mobility without device    Leisure  exercise at [facility] - pool      Posture/Postural Control   Posture/Postural Control  Postural limitations    Postural Limitations  Rounded Shoulders;Forward head      ROM / Strength   AROM / PROM / Strength  AROM;Strength      AROM   Overall AROM   Within functional limits for tasks performed      Strength   Overall Strength  Within functional limits for tasks performed      Transfers   Transfers  Sit to Stand;Stand to Sit    Sit to Stand  6: Modified independent (Device/Increase time);Without upper extremity assist;From chair/3-in-1    Stand to Sit  6: Modified independent (Device/Increase time);With upper extremity assist;To chair/3-in-1      Ambulation/Gait   Ambulation/Gait  Yes    Ambulation/Gait Assistance  5: Supervision    Ambulation Distance (Feet)  400 Feet    Assistive device  Prosthesis;None    Gait Pattern  Step-through pattern;Decreased arm swing - right;Decreased step length - left;Decreased stance time - right;Decreased hip/knee flexion - right;Decreased weight shift to right;Antalgic;Trunk flexed;Abducted- right    Ambulation Surface  Indoor;Level    Gait velocity  2.78 ft/sec comfortable & 3.71 ft/sec fast    Stairs  Yes    Stairs Assistance  5: Supervision    Stairs Assistance Details (indicate cue type and reason)  balance loss descending    Stair Management Technique  Two rails;Alternating pattern;Forwards    Number of Stairs  4    Ramp  5: Supervision prosthesis only    Curb  5: Supervision prosthesis only      Standardized Balance Assessment   Standardized Balance Assessment  Berg Balance Test      Berg Balance Test   Sit to Stand  Able to stand without  using hands and stabilize independently    Standing Unsupported  Able to stand safely 2 minutes    Sitting with Back Unsupported but Feet Supported on Floor or Stool  Able to sit safely and securely 2 minutes    Stand to Sit  Sits safely with minimal use of hands  Transfers  Able to transfer safely, minor use of hands    Standing Unsupported with Eyes Closed  Able to stand 10 seconds safely    Standing Ubsupported with Feet Together  Able to place feet together independently and stand 1 minute safely    From Standing, Reach Forward with Outstretched Arm  Can reach confidently >25 cm (10")    From Standing Position, Pick up Object from Floor  Able to pick up shoe safely and easily    From Standing Position, Turn to Look Behind Over each Shoulder  Looks behind from both sides and weight shifts well    Turn 360 Degrees  Able to turn 360 degrees safely but slowly    Standing Unsupported, Alternately Place Feet on Step/Stool  Able to complete >2 steps/needs minimal assist    Standing Unsupported, One Foot in Front  Able to take small step independently and hold 30 seconds    Standing on One Leg  Tries to lift leg/unable to hold 3 seconds but remains standing independently    Total Score  46      Functional Gait  Assessment   Gait assessed   Yes    Gait Level Surface  Walks 20 ft, slow speed, abnormal gait pattern, evidence for imbalance or deviates 10-15 in outside of the 12 in walkway width. Requires more than 7 sec to ambulate 20 ft.    Change in Gait Speed  Able to change speed, demonstrates mild gait deviations, deviates 6-10 in outside of the 12 in walkway width, or no gait deviations, unable to achieve a major change in velocity, or uses a change in velocity, or uses an assistive device.    Gait with Horizontal Head Turns  Performs head turns smoothly with slight change in gait velocity (eg, minor disruption to smooth gait path), deviates 6-10 in outside 12 in walkway width, or uses an  assistive device.    Gait with Vertical Head Turns  Performs task with slight change in gait velocity (eg, minor disruption to smooth gait path), deviates 6 - 10 in outside 12 in walkway width or uses assistive device    Gait and Pivot Turn  Pivot turns safely in greater than 3 sec and stops with no loss of balance, or pivot turns safely within 3 sec and stops with mild imbalance, requires small steps to catch balance.    Step Over Obstacle  Is able to step over one shoe box (4.5 in total height) without changing gait speed. No evidence of imbalance.    Gait with Narrow Base of Support  Ambulates less than 4 steps heel to toe or cannot perform without assistance.    Gait with Eyes Closed  Walks 20 ft, slow speed, abnormal gait pattern, evidence for imbalance, deviates 10-15 in outside 12 in walkway width. Requires more than 9 sec to ambulate 20 ft.    Ambulating Backwards  Walks 20 ft, slow speed, abnormal gait pattern, evidence for imbalance, deviates 10-15 in outside 12 in walkway width.    Steps  Alternating feet, must use rail.    Total Score  15      Prosthetics Assessment - 01/29/17 1445      Prosthetics   Prosthetic Care Dependent with  Skin check;Residual limb care;Prosthetic cleaning;Correct ply sock adjustment;Proper wear schedule/adjustment;Proper weight-bearing schedule/adjustment    Donning prosthesis   Supervision    Current prosthetic wear tolerance (days/week)   daily since delivery 38 days ago    Current prosthetic wear  tolerance (#hours/day)   built up to 13-14 of awake 15-16 hr day.     Current prosthetic weight-bearing tolerance (hours/day)   no c/o pain or discomfort with standing for balance & gait >15 minutes.     Edema  none    Residual limb condition   cylinderical, no open areas, normal color, temperature & mositure.     K code/activity level with prosthetic use   K3 Full community with variable cadence            Objective measurements completed on  examination: See above findings.      [facility] Adult PT Treatment/Exercise - 01/29/17 1445      Prosthetics   Education Provided  Skin check;Residual limb care;Prosthetic cleaning;Correct ply sock adjustment;Proper Donning;Proper wear schedule/adjustment    Person(s) Educated  Patient;Spouse    Education Method  Explanation;Verbal cues    Education Method  Verbalized understanding                  PT Long Term Goals - 01/29/17 1813      PT LONG TERM GOAL #1   Title  Berg Balance >48/56 (all LTG Target 02/12/2017)    Time  3    Period  Weeks    Status  New    Target Date  02/12/17      PT LONG TERM GOAL #2   Title  Functional Gait Assessment >/= 19/30    Time  3    Period  Weeks    Status  New    Target Date  02/12/17      PT LONG TERM GOAL #3   Title  Patient demonstrates & verbalizes proper prosthetic care including wear >90% of awake hours.     Time  3    Period  Weeks    Status  New    Target Date  02/12/17      PT LONG TERM GOAL #4   Title  Patient demonstrates proper lifting, carrying, pushing, pulling & floor transfers.     Time  3    Period  Weeks    Status  New    Target Date  02/12/17             Plan - 01/29/17 1809    Clinical Impression Statement  This 67yo male recieved his first prosthesis on 12/22/2016. He needs some instruction to progress wear & use of prosthesis without skin issues. His Balance is impaired with Berg Balance score 46/56 indicating moderate fall risk. His Functional Gait Assessment of 15/30 indicates fall risk with gait activities with scanning, dirction changes. He needs skilled instruction for proper lifting technique with prosthesis.     History and Personal Factors relevant to plan of care:  DM with neuropathy, new prosthetic wearer    Clinical Presentation  Stable    Clinical Decision Making  Low    Rehab Potential  Good    PT Frequency  1x / week    PT Duration  3 weeks    PT Treatment/Interventions  ADLs/Self  Care Home Management;DME Instruction;Gait training;Functional mobility training;Stair training;Therapeutic activities;Therapeutic exercise;Neuromuscular re-education;Patient/family education;Prosthetic Training    PT Next Visit Plan  review prosthetic care, instruct in lifting, pushing, pulling. work on scanning with gait    Consulted and Agree with Plan of Care  Patient;Family member/caregiver    Family Member Consulted  wife       Patient will benefit from skilled therapeutic intervention in order to improve the following deficits  and impairments:  Abnormal gait, Decreased balance, Decreased mobility, Postural dysfunction, Prosthetic Dependency  Visit Diagnosis: Unsteadiness on feet  Other abnormalities of gait and mobility     Problem List Patient Active Problem List   Diagnosis Date Noted  . S/P transmetatarsal amputation of foot, left ([place]) 04/25/2016  . Chronic osteomyelitis of toe, left ([place])   . Cellulitis of toe of left foot   . Chest pain 09/25/2015  . CAD (coronary artery disease) of artery bypass graft 09/25/2015  . Sinus bradycardia on ECG 09/25/2015  . Chronic diastolic CHF (congestive heart failure) ([place]) 09/25/2015  . Chest pain at rest 09/25/2015  . Aortic valvular disorder 08/04/2014  . Disorder of mitral valve 08/04/2014  . Diabetic foot infection ([place]) 06/03/2014  . Peripheral vascular disease ([place]) 06/03/2014  . Abscess of foot 06/03/2014  . CAD (coronary artery disease) 07/29/2013  . Trifascicular block 07/29/2013  . 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 Hypothyroidism 01/22/2007  . Type 2 diabetes mellitus with vascular disease ([place]) 01/22/2007  . HYPERLIPIDEMIA, MIXED 01/22/2007  . MYOCARDIAL INFARCTION, HX OF 01/22/2007  . DEGENERATIVE JOINT DISEASE 01/22/2007  . ANEMIA, IRON DEFICIENCY, HX OF 01/22/2007    Jamey Reas PT, DPT 01/30/2017, 2:16 PM  [facility] [address]  [place], [place], [address] Phone: [phone]   Fax:  [phone]  Name: Taryll Reichenberger MRN: [id number] Date of Birth: 1950-08-22

## 2017-02-05 ENCOUNTER — Encounter: Payer: Self-pay | Admitting: Physical Therapy

## 2017-02-05 ENCOUNTER — Ambulatory Visit: Payer: Medicare Other | Admitting: Physical Therapy

## 2017-02-05 DIAGNOSIS — R2681 Unsteadiness on feet: Secondary | ICD-10-CM | POA: Diagnosis not present

## 2017-02-05 DIAGNOSIS — R2689 Other abnormalities of gait and mobility: Secondary | ICD-10-CM | POA: Diagnosis not present

## 2017-02-05 NOTE — Therapy (Signed)
[facility] [address] [place], [place], [address] Phone: [phone]   Fax:  [phone]  Physical Therapy Treatment  Patient Details  Name: [name] MRN: [id number] Date of Birth: 1950/10/29 Referring Provider: Tressa Busman, DM   Encounter Date: 02/05/2017  PT End of Session - 02/05/17 1357    Visit Number  2    Number of Visits  3    Authorization Type  Medicare/ BCBS 2nd    PT Start Time  1315    PT Stop Time  1355    PT Time Calculation (min)  40 min    Activity Tolerance  Patient tolerated treatment well    Behavior During Therapy  [facility] for tasks assessed/performed       Past Medical History:  Diagnosis Date  . Coronary artery disease    a. remote stenting to ramus, prox LAD x2.  . Dental crowns present   . Diabetes mellitus    diet-controlled  . First degree AV block   . GERD (gastroesophageal reflux disease)   . Heart attack ([place]) 06/2004  . Heart murmur    aortic  . Hx MRSA infection 02/2006  . Hyperlipidemia   . Hypertension    hx. of - has not been on med. since losing wt. after gastric bypass  . Hypothyroidism   . Morbid obesity ([place])   . Mucoid cyst of joint 09/2011   left thumb  . Sleep apnea    sleep study 03/29/2011; no CPAP use  . Trifascicular block     Past Surgical History:  Procedure Laterality Date  . AMPUTATION Left 04/19/2016   Procedure: Left Foot 4th Toe Amputation vs. Transmetatarsal;  Surgeon: [name], MD;  Location: [facility];  Service: Orthopedics;  Laterality: Left;  . CATARACT EXTRACTION  02/2010; 03/2010  . COLONOSCOPY WITH PROPOFOL N/A 09/14/2014   Procedure: COLONOSCOPY WITH PROPOFOL;  Surgeon: Garlan Fair, MD;  [facility];  Service: Endoscopy;  Laterality: N/A;  . CORONARY ANGIOPLASTY WITH STENT PLACEMENT  07/12/2004; 08/03/2004   total of 3 stents  . I&D EXTREMITY Right 06/04/2014   Procedure: IRRIGATION AND DEBRIDEMENT EXTREMITY;  Surgeon: [name], MD;  Location: [facility];  Service: Orthopedics;  Laterality: Right;  . MASS EXCISION  10/04/2011   Procedure: EXCISION MASS;  Surgeon: Wynonia Sours, MD;  Location: [facility];  Service: Orthopedics;  Laterality: Left;  excision cyst debridment ip joint of left thumb  . PROSTATECTOMY    . ROUX-EN-Y GASTRIC BYPASS  10/10/2010   laparoscopic  . TOE AMPUTATION  02/23/2006   left foot second ray amputation    There were no vitals filed for this visit.  Subjective Assessment - 02/05/17 1319    Subjective  Going down steps in hard. Wearing prosthesis most of awake hours without issues.     Patient is accompained by:  Family member wife Saliou Barnier    Limitations  Lifting;Walking;Standing;House hold activities    Patient Stated Goals  To do the things he did before.        PT instructed with verbal cues & demo use of Transtibial prosthesis for each of below activities prior to activity and verbal cues during activity. 1. Picking up objects from floor: place prosthesis forward so when you bend your hips & knees, then prosthesis stays flat.  If box, place prosthesis by back corner and turn left leg out.  2. Stairs: shift weight over the prosthesis. Going  down, place half of foot over the edge so knee will bend.  3. Stepping over object: lead with prosthesis so have line of sight to make sure prosthesis clears. 4. Push / pull like raking or vacuuming: place prosthesis forward and shift weight between your feet.  5. Scanning: start in hall for walls as reference. Goal is maintain path & pace. Look right /left, up/down, diagonals up-right/down-left and up-left/down-right.  6. Walking in dark: In hall walk 2-3 steps eyes open, then close eyes for 3-5 steps.  7. Walk backwards 8. Walk sideways both right & left                             PT Long Term Goals - 02/05/17 1359      PT LONG TERM GOAL #1   Title  Berg Balance >48/56 (all LTG Target 02/12/2017)     Time  3    Period  Weeks    Status  On-going    Target Date  02/12/17      PT LONG TERM GOAL #2   Title  Functional Gait Assessment >/= 19/30    Time  3    Period  Weeks    Status  New    Target Date  02/12/17      PT LONG TERM GOAL #3   Title  Patient demonstrates & verbalizes proper prosthetic care including wear >90% of awake hours.     Time  3    Period  Weeks    Status  New    Target Date  02/12/17      PT LONG TERM GOAL #4   Title  Patient demonstrates proper lifting, carrying, pushing, pulling & floor transfers.     Time  3    Period  Weeks    Status  On-going    Target Date  02/12/17            Plan - 02/05/17 1359    Clinical Impression Statement  Patient improved ability to use prostheis to pick up objects, negotiate stairs, push/pull, scan, walk in dark & backwards/sideways eyes open with skilled instruction in technique with prosthesis.     Rehab Potential  Good    PT Frequency  1x / week    PT Duration  3 weeks    PT Treatment/Interventions  ADLs/Self Care Home Management;DME Instruction;Gait training;Functional mobility training;Stair training;Therapeutic activities;Therapeutic exercise;Neuromuscular re-education;Patient/family education;Prosthetic Training    PT Next Visit Plan  check LTGs and activities with prosthesis.     Consulted and Agree with Plan of Care  Patient;Family member/caregiver    Family Member Consulted  wife       Patient will benefit from skilled therapeutic intervention in order to improve the following deficits and impairments:  Abnormal gait, Decreased balance, Decreased mobility, Postural dysfunction, Prosthetic Dependency  Visit Diagnosis: Other abnormalities of gait and mobility  Unsteadiness on feet     Problem List Patient Active Problem List   Diagnosis Date Noted  . S/P transmetatarsal amputation of foot, left ([place]) 04/25/2016  . Chronic osteomyelitis of toe, left ([place])   . Cellulitis of toe of left foot   .  Chest pain 09/25/2015  . CAD (coronary artery disease) of artery bypass graft 09/25/2015  . Sinus bradycardia on ECG 09/25/2015  . Chronic diastolic CHF (congestive heart failure) ([place]) 09/25/2015  . Chest pain at rest 09/25/2015  . Aortic valvular disorder 08/04/2014  . Disorder of  mitral valve 08/04/2014  . Diabetic foot infection ([place]) 06/03/2014  . Peripheral vascular disease ([place]) 06/03/2014  . Abscess of foot 06/03/2014  . CAD (coronary artery disease) 07/29/2013  . Trifascicular block 07/29/2013  . 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 Hypothyroidism 01/22/2007  . Type 2 diabetes mellitus with vascular disease ([place]) 01/22/2007  . HYPERLIPIDEMIA, MIXED 01/22/2007  . MYOCARDIAL INFARCTION, HX OF 01/22/2007  . DEGENERATIVE JOINT DISEASE 01/22/2007  . ANEMIA, IRON DEFICIENCY, HX OF 01/22/2007    Jamey Reas  PT, DPT 02/05/2017, 2:01 PM  [facility] [address] [place], [place], [address] Phone: [phone]   Fax:  [phone]  Name: [name] MRN: [id number] Date of Birth: 12/10/1950

## 2017-02-05 NOTE — Patient Instructions (Signed)
1. Picking up objects from floor: place prosthesis forward so when you bend your hips & knees, then prosthesis stays flat.  If box, place prosthesis by back corner and turn left leg out.  2. Stairs: shift weight over the prosthesis. Going down, place half of foot over the edge so knee will bend.  3. Stepping over object: lead with prosthesis so have line of sight to make sure prosthesis clears. 4. Push / pull like raking or vacuuming: place prosthesis forward and shift weight between your feet.  5. Scanning: start in hall for walls as reference. Goal is maintain path & pace. Look right /left, up/down, diagonals up-right/down-left and up-left/down-right.  6. Walking in dark: In hall walk 2-3 steps eyes open, then close eyes for 3-5 steps.  7. Walk backwards 8. Walk sideways both right & left

## 2017-02-07 DIAGNOSIS — Z0000 Encounter for general adult medical examination without abnormal findings: Secondary | ICD-10-CM | POA: Diagnosis not present

## 2017-02-07 DIAGNOSIS — Z9884 Bariatric surgery status: Secondary | ICD-10-CM | POA: Diagnosis not present

## 2017-02-07 DIAGNOSIS — E039 Hypothyroidism, unspecified: Secondary | ICD-10-CM | POA: Diagnosis not present

## 2017-02-07 DIAGNOSIS — F5109 Other insomnia not due to a substance or known physiological condition: Secondary | ICD-10-CM | POA: Diagnosis not present

## 2017-02-07 DIAGNOSIS — E1149 Type 2 diabetes mellitus with other diabetic neurological complication: Secondary | ICD-10-CM | POA: Diagnosis not present

## 2017-02-07 DIAGNOSIS — Z7984 Long term (current) use of oral hypoglycemic drugs: Secondary | ICD-10-CM | POA: Diagnosis not present

## 2017-02-07 DIAGNOSIS — I2510 Atherosclerotic heart disease of native coronary artery without angina pectoris: Secondary | ICD-10-CM | POA: Diagnosis not present

## 2017-02-07 DIAGNOSIS — Z89511 Acquired absence of right leg below knee: Secondary | ICD-10-CM | POA: Diagnosis not present

## 2017-02-07 DIAGNOSIS — Z1389 Encounter for screening for other disorder: Secondary | ICD-10-CM | POA: Diagnosis not present

## 2017-02-07 DIAGNOSIS — K219 Gastro-esophageal reflux disease without esophagitis: Secondary | ICD-10-CM | POA: Diagnosis not present

## 2017-02-12 ENCOUNTER — Encounter: Payer: Self-pay | Admitting: Physical Therapy

## 2017-02-12 ENCOUNTER — Ambulatory Visit: Payer: Medicare Other | Admitting: Physical Therapy

## 2017-02-12 DIAGNOSIS — R2681 Unsteadiness on feet: Secondary | ICD-10-CM | POA: Diagnosis not present

## 2017-02-12 DIAGNOSIS — R2689 Other abnormalities of gait and mobility: Secondary | ICD-10-CM

## 2017-02-13 NOTE — Therapy (Signed)
[facility] [address] [place], [place], [address] Phone: [phone]   Fax:  [phone]  Physical Therapy Treatment  Patient Details  Name: Trask Vosler MRN: [id number] Date of Birth: 1950-09-20 Referring Provider: Tressa Busman, DM   Encounter Date: 02/12/2017  PT End of Session - 02/12/17 1920    Visit Number  3    Number of Visits  3    Authorization Type  Medicare/ BCBS 2nd    PT Start Time  1315    PT Stop Time  1355    PT Time Calculation (min)  40 min    Activity Tolerance  Patient tolerated treatment well    Behavior During Therapy  [facility] for tasks assessed/performed       Past Medical History:  Diagnosis Date  . Coronary artery disease    a. remote stenting to ramus, prox LAD x2.  . Dental crowns present   . Diabetes mellitus    diet-controlled  . First degree AV block   . GERD (gastroesophageal reflux disease)   . Heart attack ([place]) 06/2004  . Heart murmur    aortic  . Hx MRSA infection 02/2006  . Hyperlipidemia   . Hypertension    hx. of - has not been on med. since losing wt. after gastric bypass  . Hypothyroidism   . Morbid obesity ([place])   . Mucoid cyst of joint 09/2011   left thumb  . Sleep apnea    sleep study 03/29/2011; no CPAP use  . Trifascicular block     Past Surgical History:  Procedure Laterality Date  . AMPUTATION Left 04/19/2016   Procedure: Left Foot 4th Toe Amputation vs. Transmetatarsal;  Surgeon: [name], MD;  Location: [facility];  Service: Orthopedics;  Laterality: Left;  . CATARACT EXTRACTION  02/2010; 03/2010  . COLONOSCOPY WITH PROPOFOL N/A 09/14/2014   Procedure: COLONOSCOPY WITH PROPOFOL;  Surgeon: Garlan Fair, MD;  Location: [facility];  Service: Endoscopy;  Laterality: N/A;  . CORONARY ANGIOPLASTY WITH STENT PLACEMENT  07/12/2004; 08/03/2004   total of 3 stents  . I&D EXTREMITY Right 06/04/2014   Procedure: IRRIGATION AND DEBRIDEMENT EXTREMITY;  Surgeon: [name], MD;  Location: [facility];  Service: Orthopedics;  Laterality: Right;  . MASS EXCISION  10/04/2011   Procedure: EXCISION MASS;  Surgeon: Wynonia Sours, MD;  Location: [facility];  Service: Orthopedics;  Laterality: Left;  excision cyst debridment ip joint of left thumb  . PROSTATECTOMY    . ROUX-EN-Y GASTRIC BYPASS  10/10/2010   laparoscopic  . TOE AMPUTATION  02/23/2006   left foot second ray amputation    There were no vitals filed for this visit.  Subjective Assessment - 02/12/17 1315    Subjective  No falls. He is wearing prosthesis 12-14hrs / day without issues.     Patient is accompained by:  Family member    Limitations  Lifting;Walking;Standing;House hold activities    Patient Stated Goals  To do the things he did before.     Currently in Pain?  No/denies         [facility] PT Assessment - 02/12/17 1315      Ambulation/Gait   Ambulation/Gait  Yes    Ambulation/Gait Assistance  6: Modified independent (Device/Increase time)    Ambulation Distance (Feet)  1000 Feet    Assistive device  Prosthesis;None    Gait Pattern  Within Functional Limits    Ambulation Surface  Indoor;Level;Outdoor;Paved;Gravel;Grass hill  Gait velocity  3.70 ft/sec comfortable & 4.56 ft/sec fast pace Initial was 2.78 ft/sec comfortable & 3.71 ft/sec fast    Stairs  Yes    Stairs Assistance  6: Modified independent (Device/Increase time)    Stairs Assistance Details (indicate cue type and reason)  PT demo, instructed technique if no rails available. Pt return demo understanding safely, modified independent.     Stair Management Technique  One rail Right;One rail Left;Alternating pattern;Forwards;No rails;Step to pattern;Sideways    Number of Stairs  4 3 reps    Ramp  6: Modified independent (Device) prosthesis only including grass slope    Curb  6: Modified independent (Device/increase time) prosthesis only leading with either LE      Berg Balance Test   Sit to Stand  Able to stand  without using hands and stabilize independently    Standing Unsupported  Able to stand safely 2 minutes    Sitting with Back Unsupported but Feet Supported on Floor or Stool  Able to sit safely and securely 2 minutes    Stand to Sit  Sits safely with minimal use of hands    Transfers  Able to transfer safely, minor use of hands    Standing Unsupported with Eyes Closed  Able to stand 10 seconds safely    Standing Ubsupported with Feet Together  Able to place feet together independently and stand 1 minute safely    From Standing, Reach Forward with Outstretched Arm  Can reach confidently >25 cm (10")    From Standing Position, Pick up Object from Floor  Able to pick up shoe safely and easily    From Standing Position, Turn to Look Behind Over each Shoulder  Looks behind from both sides and weight shifts well    Turn 360 Degrees  Able to turn 360 degrees safely in 4 seconds or less    Standing Unsupported, Alternately Place Feet on Step/Stool  Able to stand independently and safely and complete 8 steps in 20 seconds    Standing Unsupported, One Foot in Front  Able to take small step independently and hold 30 seconds    Standing on One Leg  Tries to lift leg/unable to hold 3 seconds but remains standing independently    Total Score  51      Functional Gait  Assessment   Gait assessed   Yes    Gait Level Surface  Walks 20 ft in less than 5.5 sec, no assistive devices, good speed, no evidence for imbalance, normal gait pattern, deviates no more than 6 in outside of the 12 in walkway width.    Change in Gait Speed  Able to smoothly change walking speed without loss of balance or gait deviation. Deviate no more than 6 in outside of the 12 in walkway width.    Gait with Horizontal Head Turns  Performs head turns smoothly with no change in gait. Deviates no more than 6 in outside 12 in walkway width    Gait with Vertical Head Turns  Performs head turns with no change in gait. Deviates no more than 6 in  outside 12 in walkway width.    Gait and Pivot Turn  Pivot turns safely within 3 sec and stops quickly with no loss of balance.    Step Over Obstacle  Is able to step over one shoe box (4.5 in total height) without changing gait speed. No evidence of imbalance.    Gait with Narrow Base of Support  Ambulates less  than 4 steps heel to toe or cannot perform without assistance.    Gait with Eyes Closed  Walks 20 ft, uses assistive device, slower speed, mild gait deviations, deviates 6-10 in outside 12 in walkway width. Ambulates 20 ft in less than 9 sec but greater than 7 sec.    Ambulating Backwards  Walks 20 ft, uses assistive device, slower speed, mild gait deviations, deviates 6-10 in outside 12 in walkway width.    Steps  Alternating feet, must use rail.    Total Score  23      Prosthetics Assessment - 02/12/17 1315      Prosthetics   Prosthetic Care Independent with  Skin check;Residual limb care;Care of non-amputated limb;Prosthetic cleaning;Ply sock cleaning;Correct ply sock adjustment;Proper wear schedule/adjustment;Proper weight-bearing schedule/adjustment    Current prosthetic wear tolerance (days/week)   daily    Current prosthetic wear tolerance (#hours/day)   >90% of awake hours    Current prosthetic weight-bearing tolerance (hours/day)   tolerated standing 20 minutes with no c/o pain or discomfort on residual limb    Edema  none    Residual limb condition   cylinderical, no open areas, normal color, temperature & mositure.     K code/activity level with prosthetic use   K3 Full community with variable cadence                 [facility] Adult PT Treatment/Exercise - 02/12/17 1315      Self-Care   Self-Care  ADL's;Lifting    ADL's  pushing & pulling with prosthesis with weight shift between feet safely.     Lifting  Lifting 25# crate from /to floor. Initially PT instructed in foot position & pt able to return demo understanding.              PT Education - 02/12/17  1315    Education provided  Yes    Education Details  Driving options with TTA prosthesis,     Person(s) Educated  Patient    Methods  Explanation;Demonstration;Verbal cues    Comprehension  Verbalized understanding          PT Long Term Goals - 02/12/17 1930      PT LONG TERM GOAL #1   Title  Berg Balance >48/56 (all LTG Target 02/12/2017)    Baseline  MET 02/12/2017  Berg 51/56    Time  3    Period  Weeks    Status  Achieved      PT LONG TERM GOAL #2   Title  Functional Gait Assessment >/= 19/30    Baseline  MET 02/12/2017  FGA 23/30    Time  3    Period  Weeks    Status  Achieved      PT LONG TERM GOAL #3   Title  Patient demonstrates & verbalizes proper prosthetic care including wear >90% of awake hours.     Baseline  MET 02/12/2017    Time  3    Period  Weeks    Status  Achieved      PT LONG TERM GOAL #4   Title  Patient demonstrates proper lifting, carrying, pushing, pulling & floor transfers.     Baseline  MET 02/12/2017    Time  3    Period  Weeks    Status  Achieved            Plan - 02/12/17 1818    Clinical Impression Statement  This patient met all LTG set at  evaluation. He has improved use of prosthesis with activities including pushing, pulling & lifting. Berg Balance Test [phone] and Functional Gait Assessment 23/30 both indicate lower fall risk.     Rehab Potential  Good    PT Frequency  1x / week    PT Duration  3 weeks    PT Treatment/Interventions  ADLs/Self Care Home Management;DME Instruction;Gait training;Functional mobility training;Stair training;Therapeutic activities;Therapeutic exercise;Neuromuscular re-education;Patient/family education;Prosthetic Training    PT Next Visit Plan  discharge    Consulted and Agree with Plan of Care  Patient;Family member/caregiver    Family Member Consulted  wife       Patient will benefit from skilled therapeutic intervention in order to improve the following deficits and impairments:  Abnormal gait,  Decreased balance, Decreased mobility, Postural dysfunction, Prosthetic Dependency  Visit Diagnosis: Other abnormalities of gait and mobility  Unsteadiness on feet     Problem List Patient Active Problem List   Diagnosis Date Noted  . S/P transmetatarsal amputation of foot, left ([place]) 04/25/2016  . Chronic osteomyelitis of toe, left ([place])   . Cellulitis of toe of left foot   . Chest pain 09/25/2015  . CAD (coronary artery disease) of artery bypass graft 09/25/2015  . Sinus bradycardia on ECG 09/25/2015  . Chronic diastolic CHF (congestive heart failure) ([place]) 09/25/2015  . Chest pain at rest 09/25/2015  . Aortic valvular disorder 08/04/2014  . Disorder of mitral valve 08/04/2014  . Diabetic foot infection ([place]) 06/03/2014  . Peripheral vascular disease ([place]) 06/03/2014  . Abscess of foot 06/03/2014  . CAD (coronary artery disease) 07/29/2013  . Trifascicular block 07/29/2013  . 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 Hypothyroidism 01/22/2007  . Type 2 diabetes mellitus with vascular disease ([place]) 01/22/2007  . HYPERLIPIDEMIA, MIXED 01/22/2007  . MYOCARDIAL INFARCTION, HX OF 01/22/2007  . DEGENERATIVE JOINT DISEASE 01/22/2007  . ANEMIA, IRON DEFICIENCY, HX OF 01/22/2007   PHYSICAL THERAPY DISCHARGE SUMMARY  Visits from Start of Care: 3  Current functional level related to goals / functional outcomes: See above   Remaining deficits: See above   Education / Equipment: Prosthetic Training  Plan: Patient agrees to discharge.  Patient goals were met. Patient is being discharged due to meeting the stated rehab goals.  ?????         Amayah Staheli PT, DPT 02/13/2017, 12:21 PM  [facility] [address] [place], [place], [address] Phone: [phone]   Fax:  [phone]  Name: Daimien Patmon MRN: [id number] Date of Birth: 1950/07/16

## 2017-04-02 DIAGNOSIS — Z09 Encounter for follow-up examination after completed treatment for conditions other than malignant neoplasm: Secondary | ICD-10-CM | POA: Diagnosis not present

## 2017-04-02 DIAGNOSIS — Z89511 Acquired absence of right leg below knee: Secondary | ICD-10-CM | POA: Diagnosis not present

## 2017-06-07 DIAGNOSIS — I10 Essential (primary) hypertension: Secondary | ICD-10-CM | POA: Diagnosis not present

## 2017-06-07 DIAGNOSIS — E119 Type 2 diabetes mellitus without complications: Secondary | ICD-10-CM | POA: Diagnosis not present

## 2017-06-07 DIAGNOSIS — K219 Gastro-esophageal reflux disease without esophagitis: Secondary | ICD-10-CM | POA: Diagnosis not present

## 2017-06-07 DIAGNOSIS — F5109 Other insomnia not due to a substance or known physiological condition: Secondary | ICD-10-CM | POA: Diagnosis not present

## 2017-09-11 DIAGNOSIS — Z23 Encounter for immunization: Secondary | ICD-10-CM | POA: Diagnosis not present

## 2017-09-18 IMAGING — CR DG CHEST 2V
2 series · 2 of 2 positions shown · non-contrast
Comparison: 10/02/2011

CLINICAL DATA: Mid sternal chest discomfort since last night,
history coronary artery disease with stenting, MI, hypertension

EXAM:
CHEST  2 VIEW

[w chest pa]
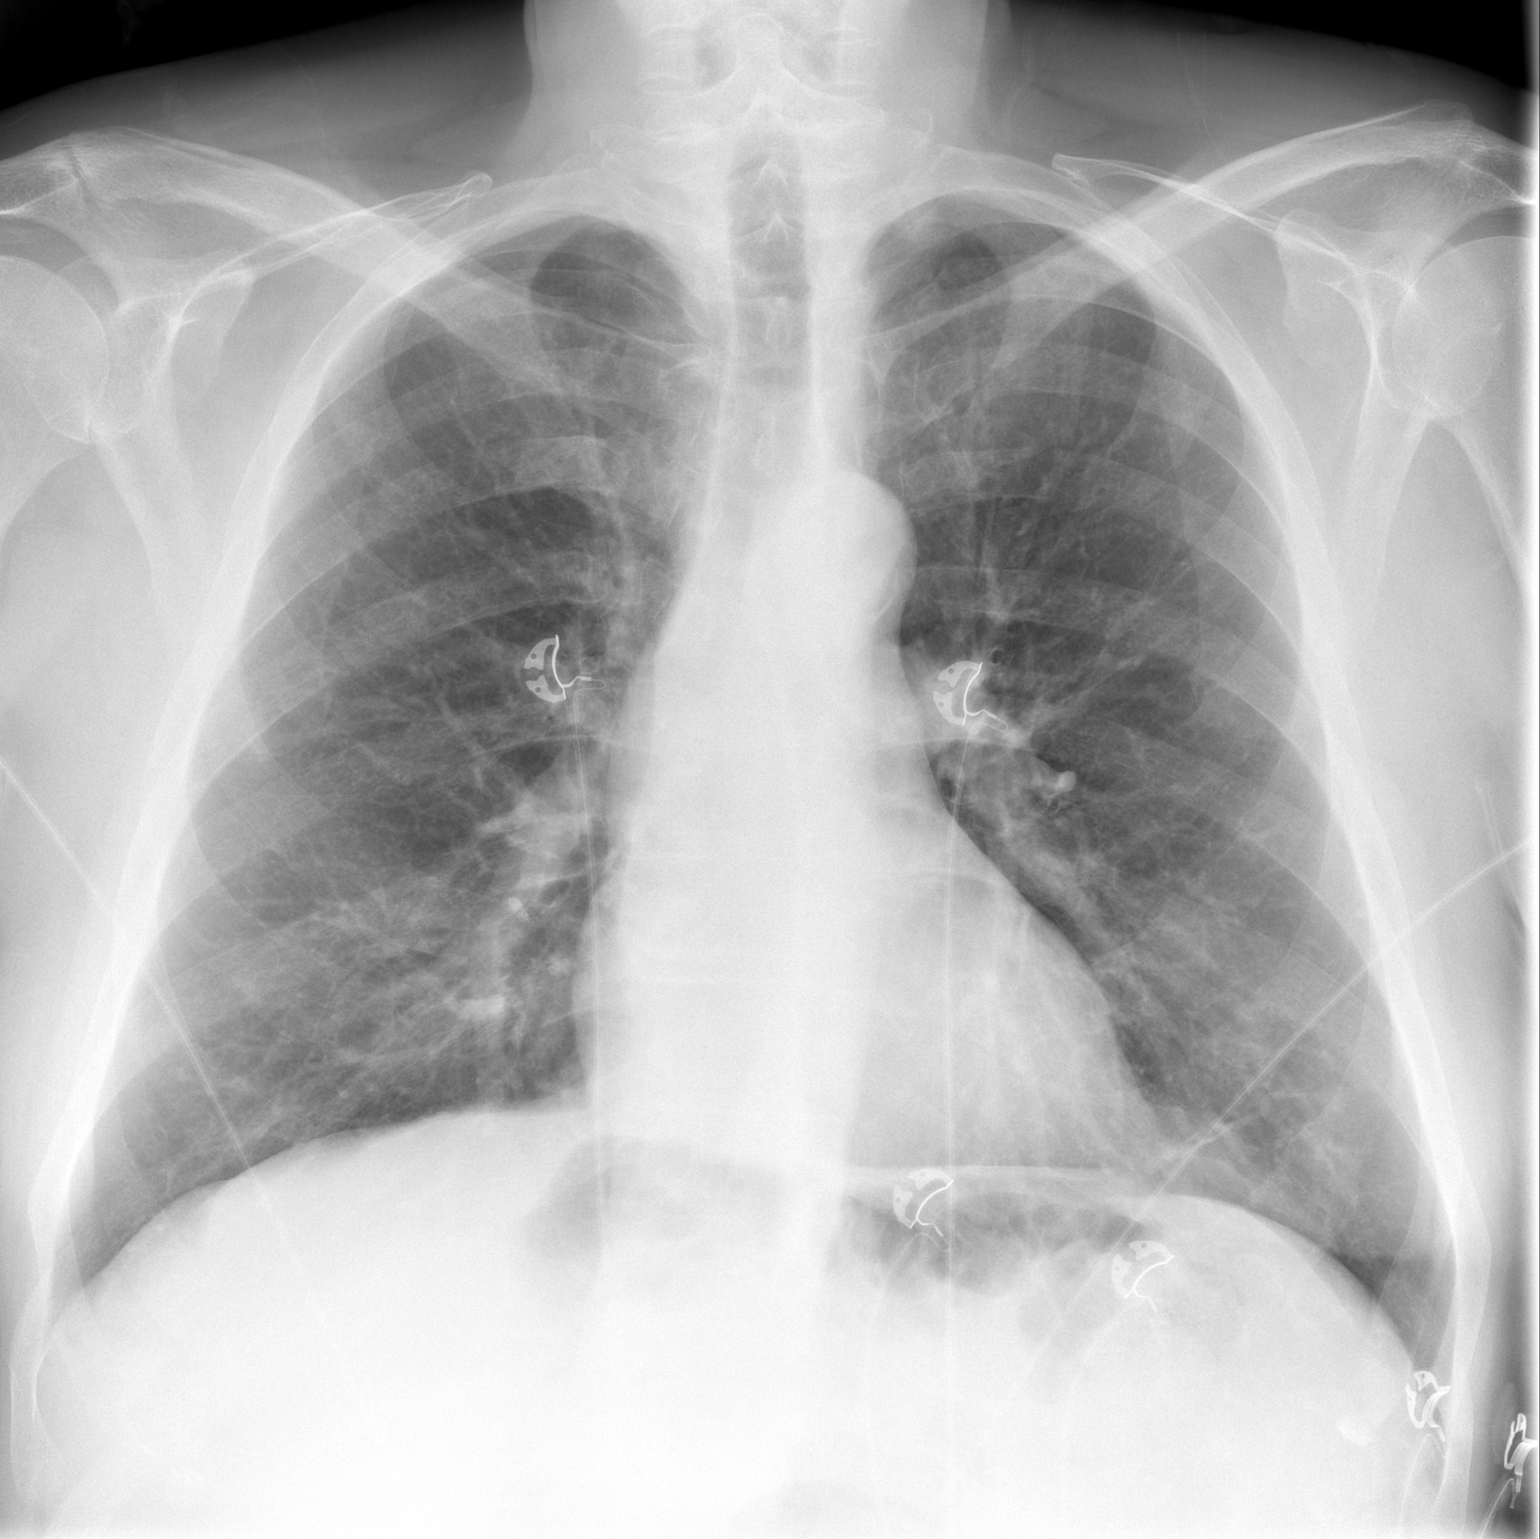

[w chest lat]
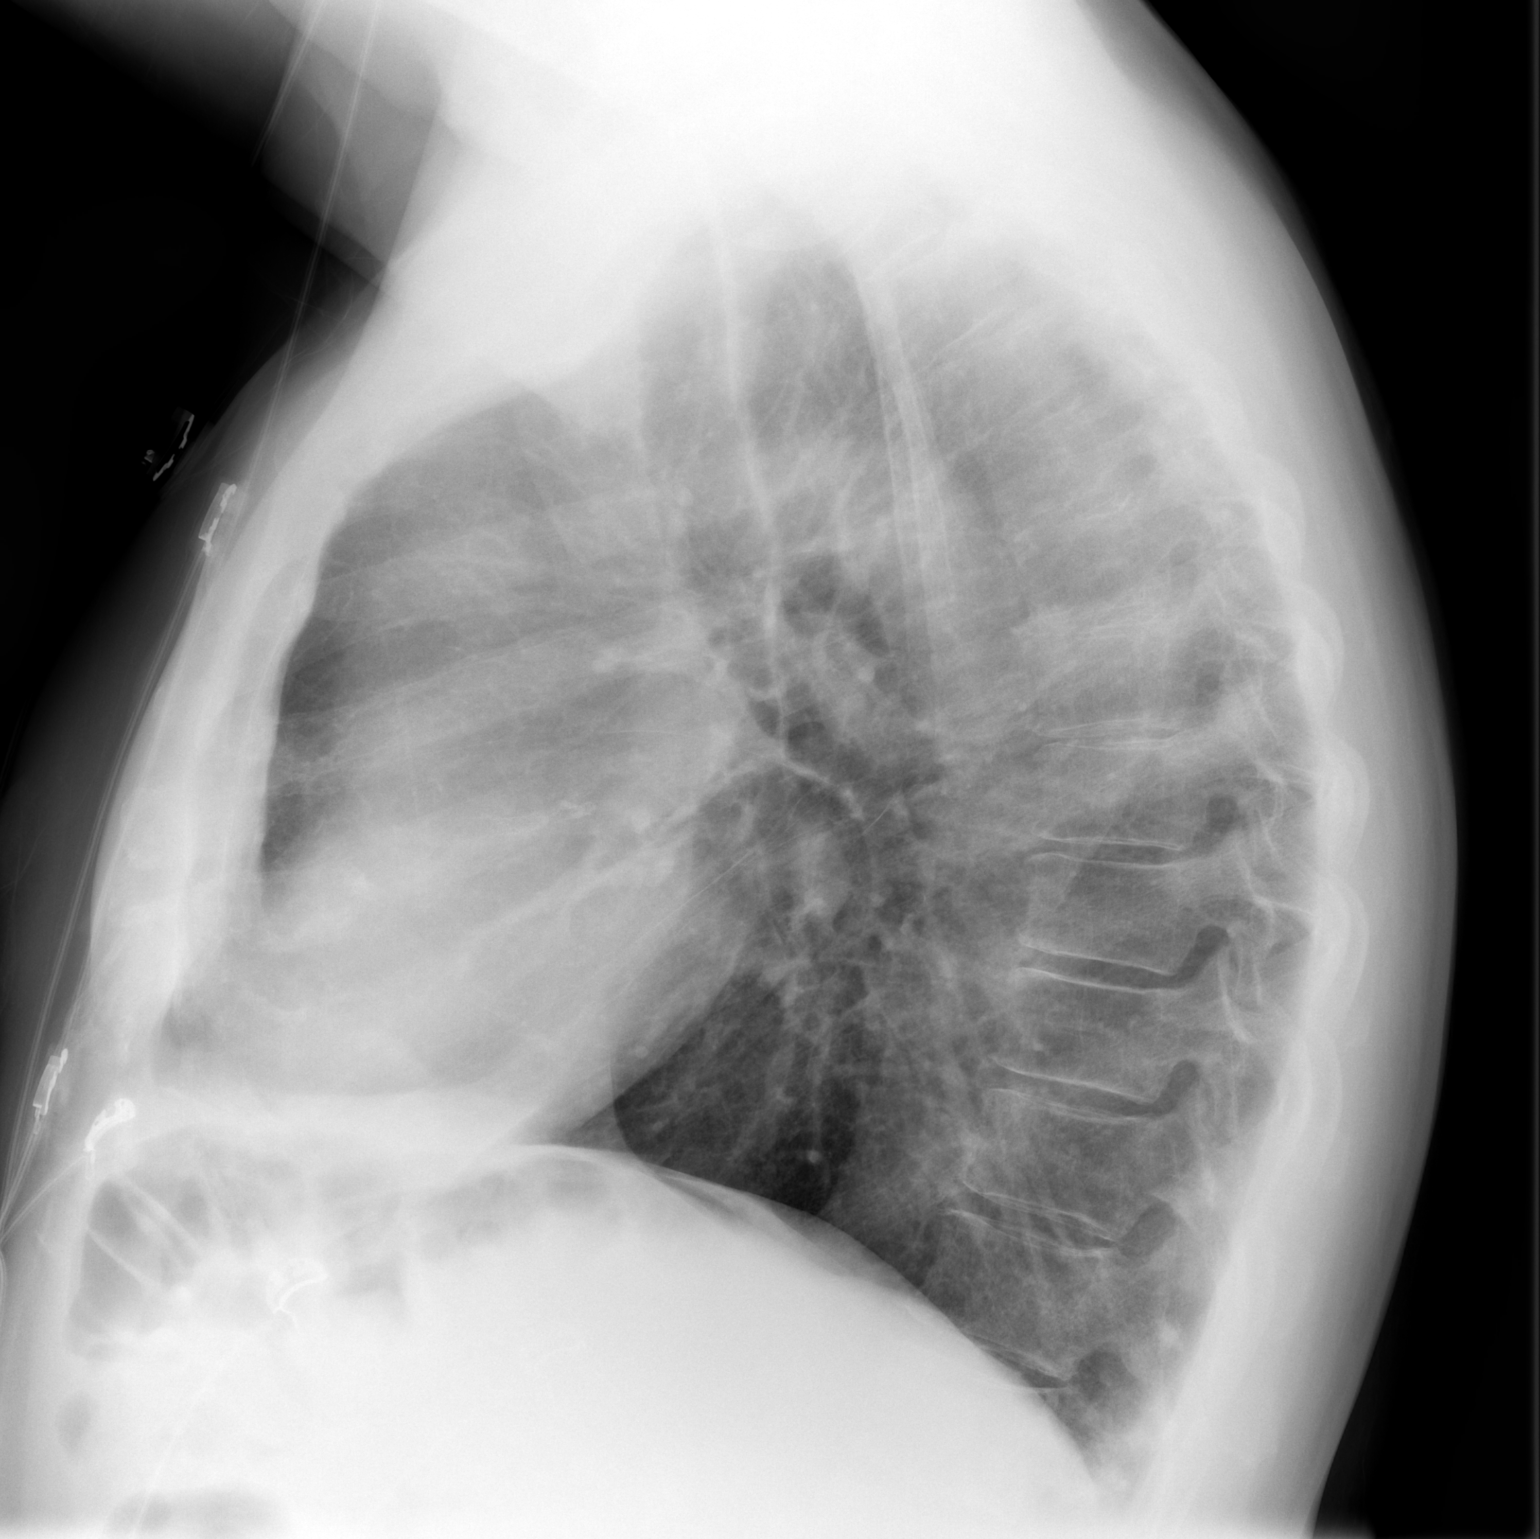

[2 of 2 positions shown; findings below may reference images not displayed]

FINDINGS: Normal heart size, mediastinal contours, and pulmonary vascularity.

Lungs clear.

No pleural effusion or pneumothorax.

Bones unremarkable.
IMPRESSION: No acute abnormalities.

## 2017-10-01 DIAGNOSIS — Z89511 Acquired absence of right leg below knee: Secondary | ICD-10-CM | POA: Diagnosis not present

## 2017-11-01 DIAGNOSIS — I10 Essential (primary) hypertension: Secondary | ICD-10-CM | POA: Diagnosis not present

## 2017-11-01 DIAGNOSIS — E1149 Type 2 diabetes mellitus with other diabetic neurological complication: Secondary | ICD-10-CM | POA: Diagnosis not present

## 2017-11-13 ENCOUNTER — Other Ambulatory Visit: Payer: Self-pay | Admitting: Cardiology

## 2017-11-14 ENCOUNTER — Other Ambulatory Visit: Payer: Self-pay | Admitting: *Deleted

## 2017-11-14 DIAGNOSIS — M65321 Trigger finger, right index finger: Secondary | ICD-10-CM | POA: Diagnosis not present

## 2017-11-14 DIAGNOSIS — M65322 Trigger finger, left index finger: Secondary | ICD-10-CM | POA: Diagnosis not present

## 2017-11-14 MED ORDER — LISINOPRIL 5 MG PO TABS: 5.0000 mg | ORAL_TABLET | Freq: Every day | ORAL | 1 refills | Status: DC

## 2017-11-14 NOTE — Progress Notes (Signed)
Refill for Lisinopril sent to pharmacy as requested.

## 2017-12-08 ENCOUNTER — Other Ambulatory Visit: Payer: Self-pay | Admitting: Cardiology

## 2017-12-10 ENCOUNTER — Encounter: Payer: Self-pay | Admitting: Cardiology

## 2017-12-10 ENCOUNTER — Ambulatory Visit (INDEPENDENT_AMBULATORY_CARE_PROVIDER_SITE_OTHER): Payer: Medicare Other | Admitting: Cardiology

## 2017-12-10 VITALS — BP 124/78 | HR 64 | Ht >= 80 in | Wt 237.0 lb

## 2017-12-10 DIAGNOSIS — I452 Bifascicular block: Secondary | ICD-10-CM

## 2017-12-10 DIAGNOSIS — E782 Mixed hyperlipidemia: Secondary | ICD-10-CM

## 2017-12-10 DIAGNOSIS — Z89511 Acquired absence of right leg below knee: Secondary | ICD-10-CM

## 2017-12-10 DIAGNOSIS — I2510 Atherosclerotic heart disease of native coronary artery without angina pectoris: Secondary | ICD-10-CM

## 2017-12-10 MED ORDER — NITROGLYCERIN 0.4 MG SL SUBL: 0.4000 mg | SUBLINGUAL_TABLET | SUBLINGUAL | 11 refills | Status: DC | PRN

## 2017-12-10 MED ORDER — LISINOPRIL 5 MG PO TABS: 5.0000 mg | ORAL_TABLET | Freq: Every day | ORAL | 3 refills | Status: DC

## 2017-12-10 NOTE — Progress Notes (Signed)
Cardiology Office Note:    Date:  12/10/2017   ID:  [name], DOB [birth year], MRN [id number]  PCP:  [name], MD  Cardiologist:  No primary care provider on file.  Electrophysiologist:  None   Referring MD: [name], MD     History of Present Illness:    [name] is a 67 y.o. male here for follow-up of CAD.  former patient of Dr. [name] with prior stent to ramus in 2006, proximal LAD x 2. His distal RCA was diffusely diseased.   There was a fixed inferior defect in 2012 on nuclear stress test. Also has diabetes hypertension hyperlipidemia and sleep apnea. On October 10, 2010 he underwent gastric bypass surgery by Dr. [name] Done. Excellent weight loss of approximately 130 pounds. He is off of his diabetic medications, he has decreased his Lipitor. He is not on any blood pressure medications.  He continues to have first degree A-V block, bifascicular block, sinus bradycardia. This has been present for several years. No high-risk symptoms such as syncope, significant shortness of breath or angina with exercise. He is to continue to monitor this and that it may result in a pacemaker in the future. During surgery, his heart rate was in the 30s at times. Anesthesiologist noted this. I observed another EKG showing heart rate of 48 with bifascicular block. Once again no high-risk symptoms. We have been watching this.  He states that he has had a heart murmur since childhood, mild mitral regurgitation on last echocardiogram in 2011.Doing well, no change in symptoms.  Has had his right lower extremity amputated after long-standing history of infection.   Still continues to do quite well without any fevers chills nausea vomiting.  Post amputation of right lower extremity after ongoing infection.  Utilizes a scooter.    Past Medical History:  Diagnosis Date  . Coronary artery disease    a. remote stenting to ramus, prox LAD x2.  . Dental crowns present   . Diabetes mellitus    diet-controlled  . First degree AV block   . GERD (gastroesophageal reflux disease)   . Heart attack ([place]) 06/2004  . Heart murmur    aortic  . Hx MRSA infection 02/2006  . Hyperlipidemia   . Hypertension    hx. of - has not been on med. since losing wt. after gastric bypass  . Hypothyroidism   . Morbid obesity ([place])   . Mucoid cyst of joint 09/2011   left thumb  . Sleep apnea    sleep study 03/29/2011; no CPAP use  . Trifascicular block     Past Surgical History:  Procedure Laterality Date  . AMPUTATION Left 04/19/2016   Procedure: Left Foot 4th Toe Amputation vs. Transmetatarsal;  Surgeon: [name], MD;  Location: [place];  Service: Orthopedics;  Laterality: Left;  . CATARACT EXTRACTION  02/2010; 03/2010  . COLONOSCOPY WITH PROPOFOL N/A 09/14/2014   Procedure: COLONOSCOPY WITH PROPOFOL;  Surgeon: Garlan Fair, MD;  [facility];  Service: Endoscopy;  Laterality: N/A;  . CORONARY ANGIOPLASTY WITH STENT PLACEMENT  07/12/2004; 08/03/2004   total of 3 stents  . I&D EXTREMITY Right 06/04/2014   Procedure: IRRIGATION AND DEBRIDEMENT EXTREMITY;  Surgeon: [name], MD;  Location: [place];  Service: Orthopedics;  Laterality: Right;  . MASS EXCISION  10/04/2011   Procedure: EXCISION MASS;  Surgeon: Wynonia Sours, MD;  Location: [place];  Service: Orthopedics;  Laterality: Left;  excision cyst debridment ip joint of  left thumb  . PROSTATECTOMY    . ROUX-EN-Y GASTRIC BYPASS  10/10/2010   laparoscopic  . TOE AMPUTATION  02/23/2006   left foot second ray amputation    Current Medications: Current Meds  Medication Sig  . aspirin EC 81 MG tablet Take 81 mg by mouth daily.  Marland Kitchen atorvastatin (LIPITOR) 10 MG tablet Take 10 mg by mouth daily after supper.   Marland Kitchen CALCIUM CITRATE PO Take 500 mg by mouth 3 (three) times daily.  . Cyanocobalamin (B-12) 500 MCG SUBL Place 500 mcg under the tongue every Monday, Wednesday, and Friday.   . ferrous sulfate 325 (65 FE) MG  tablet Take 325 mg by mouth daily with breakfast.  . fluticasone (FLONASE) 50 MCG/ACT nasal spray Place 2 sprays into both nostrils daily as needed for allergies.   Marland Kitchen ibuprofen (ADVIL,MOTRIN) 200 MG tablet Take 400 mg by mouth every 6 (six) hours as needed for headache (pain).  Marland Kitchen levothyroxine (SYNTHROID, LEVOTHROID) 200 MCG tablet Take 200 mcg by mouth daily before breakfast.   . lisinopril (PRINIVIL,ZESTRIL) 5 MG tablet Take 1 tablet (5 mg total) by mouth daily.  . metFORMIN (GLUCOPHAGE) 500 MG tablet Take 500 mg by mouth daily.   . Multiple Vitamin (MULTIVITAMIN WITH MINERALS) TABS tablet Take 2 tablets by mouth 2 (two) times daily.  . Multiple Vitamins-Minerals (PRESERVISION AREDS 2) CAPS Take 1 capsule by mouth 2 (two) times daily.  . nitroGLYCERIN (NITROSTAT) 0.4 MG SL tablet Place 1 tablet (0.4 mg total) under the tongue every 5 (five) minutes x 3 doses as needed for chest pain.  . Omega-3 Fatty Acids (FISH OIL TRIPLE STRENGTH PO) Take 1 capsule by mouth daily.  . Omeprazole 20 MG TBEC Take 20 mg by mouth daily as needed (acid reflux/ indigestion).   Marland Kitchen OVER THE COUNTER MEDICATION Take 1 tablet by mouth 2 (two) times daily. [place] Joint Health Complex: glucosamine/cat's claw extract  . OVER THE COUNTER MEDICATION Take 3 tablets by mouth daily. [place] Pain Relief Complex: Safflower Extract & Boswellia  . OVER THE COUNTER MEDICATION Take 2 tablets by mouth at bedtime as needed (sleep). Remfresh - OTC sleep aid  . Probiotic Product (PROBIOTIC FORMULA PO) Take 2 tablets by mouth daily after supper.   . sildenafil (REVATIO) 20 MG tablet Take 40-100 mg by mouth daily as needed (Erectile Dysfunction).   . zaleplon (SONATA) 10 MG capsule Take 10 mg by mouth at bedtime as needed for sleep.   . [DISCONTINUED] lisinopril (PRINIVIL,ZESTRIL) 5 MG tablet Take 1 tablet (5 mg total) by mouth daily.  . [DISCONTINUED] nitroGLYCERIN (NITROSTAT) 0.4 MG SL tablet Place 1 tablet (0.4 mg total) every 5 (five)  minutes x 3 doses as needed under the tongue for chest pain.     Allergies:   Bactrim [sulfamethoxazole-trimethoprim]; Moxifloxacin; Penicillins; and Quinolones   Social History   Socioeconomic History  . Marital status: Married    Spouse name: Not on file  . Number of children: Not on file  . Years of education: Not on file  . Highest education level: Not on file  Occupational History  . Occupation: Engineer, maintenance (IT) taxes  Social Needs  . Financial resource strain: Not on file  . Food insecurity:    Worry: Not on file    Inability: Not on file  . Transportation needs:    Medical: Not on file    Non-medical: Not on file  Tobacco Use  . Smoking status: Never Smoker  . Smokeless tobacco: Never Used  Substance  and Sexual Activity  . Alcohol use: No  . Drug use: No  . Sexual activity: Not on file  Lifestyle  . Physical activity:    Days per week: Not on file    Minutes per session: Not on file  . Stress: Not on file  Relationships  . Social connections:    Talks on phone: Not on file    Gets together: Not on file    Attends religious service: Not on file    Active member of club or organization: Not on file    Attends meetings of clubs or organizations: Not on file    Relationship status: Not on file  Other Topics Concern  . Not on file  Social History Narrative   Parents and sister all using CPAP     Family History: The patient's family history includes Heart disease in his mother.  ROS:   Please see the history of present illness.     All other systems reviewed and are negative.  EKGs/Labs/Other Studies Reviewed:    The following studies were reviewed today:  ECHO 04/19/16:  - Left ventricle: The cavity size was normal. Wall thickness was increased in a pattern of moderate LVH. Systolic function was mildly reduced. The estimated ejection fraction was in the range of 45% to 50%. Inferior hypokinesis. Doppler parameters are consistent with abnormal left  ventricular relaxation (grade 1 diastolic dysfunction). The E/e&' ratio is between 8-15, suggesting indeterminate LV filling pressure. - Aortic valve: Trileaflet; mildly calcified leaflets. Mild stenosis. There was mild regurgitation. Mean gradient (S): 14 mm Hg. Peak gradient (S): 27 mm Hg. - Aorta: Aortic root dimension: 42 mm (ED). He is 27foot 8in.  - Aortic root: The aortic root is mildly dilated. - Mitral valve: Mildly thickened leaflets . There was mild regurgitation. - Left atrium: The atrium was normal in size. - Right ventricle: Systolic function is mildly reduced. - Right atrium: The atrium was normal in size. - Inferior vena cava: The vessel was normal in size. The respirophasic diameter changes were in the normal range (>= 50%), consistent with normal central venous pressure.  Impressions:  - Compared to a prior study in 2017, the LVEF is slightly worse at 45-50%. There is a suggestion of inferior wall hypokinesis.  EKG:  EKG is  ordered today.  The ekg ordered today demonstrates sinus rhythm bifascicular block 64 personally reviewed and interpreted  Recent Labs: No results found for requested labs within last 8760 hours.  Recent Lipid Panel No results found for: CHOL, TRIG, HDL, CHOLHDL, VLDL, LDLCALC, LDLDIRECT  Physical Exam:    VS:  BP 124/78   Pulse 64   Ht 6\' 8"  (2.032 m)   Wt 237 lb (107.5 kg)   BMI 26.04 kg/m     Wt Readings from Last 3 Encounters:  12/10/17 237 lb (107.5 kg)  12/06/16 221 lb 12.8 oz (100.6 kg)  07/30/16 223 lb (101.2 kg)     GEN:  Well nourished, well developed in no acute distress HEENT: Normal NECK: No JVD; No carotid bruits LYMPHATICS: No lymphadenopathy CARDIAC: RRR, 1/6 SM, no rubs, gallops RESPIRATORY:  Clear to auscultation without rales, wheezing or rhonchi  ABDOMEN: Soft, non-tender, non-distended MUSCULOSKELETAL:  No edema; RBKA SKIN: Warm and dry NEUROLOGIC:  Alert and oriented x 3 PSYCHIATRIC:   Normal affect   ASSESSMENT:    1. Coronary artery disease involving native coronary artery of native heart without angina pectoris   2. Bifascicular block   3. HYPERLIPIDEMIA,  MIXED   4. Status post below-knee amputation of right lower extremity (HCC)    PLAN:    In order of problems listed above:  Coronary artery disease - 3 prior total stents with ramus, LAD proximal with diffusely diseased RCA. - Aggressive secondary prevention.  Aspirin.  Statin.  No anginal symptoms. -She is currently not on beta-blocker because of bifascicular block.  Avoiding AV nodal blocking agents. -Last nuclear stress test no ischemia  Old MI - Lisinopril 5 mg once a day.  We are going to give him a refill.  Creatinine normal.  EF 45 to 50%.  Hyperlipidemia -On low-dose atorvastatin, prior low LDL.  Does not desire to be on higher intensity statin.  Bifascicular block - No high risk symptoms, syncope for instance.  Heart murmur - Likely secondary to aortic valve calcification as well as mitral regurgitation.  Right below-knee amputation -Prior Charcot joint.  Extensive wound treatment prior to this.  He is doing very well with this.  1 year follow-up.    Medication Adjustments/Labs and Tests Ordered: Current medicines are reviewed at length with the patient today.  Concerns regarding medicines are outlined above.  Orders Placed This Encounter  Procedures  . EKG 12-Lead   Meds ordered this encounter  Medications  . lisinopril (PRINIVIL,ZESTRIL) 5 MG tablet    Sig: Take 1 tablet (5 mg total) by mouth daily.    Dispense:  90 tablet    Refill:  3  . nitroGLYCERIN (NITROSTAT) 0.4 MG SL tablet    Sig: Place 1 tablet (0.4 mg total) under the tongue every 5 (five) minutes x 3 doses as needed for chest pain.    Dispense:  25 tablet    Refill:  11    Patient Instructions  Medication Instructions:  The current medical regimen is effective;  continue present plan and medications.  If you  need a refill on your cardiac medications before your next appointment, please call your pharmacy.   Follow-Up: At [facility], you and your health needs are our priority.  As part of our continuing mission to provide you with exceptional heart care, we have created designated Provider Care Teams.  These Care Teams include your primary Cardiologist (physician) and Advanced Practice Providers (APPs -  Physician Assistants and Nurse Practitioners) who all work together to provide you with the care you need, when you need it. You will need a follow up appointment in 12 months.  Please call our office 2 months in advance to schedule this appointment.  You may see Dr Marlou Porch or one of the following Advanced Practice Providers on your designated Care Team:   Truitt Merle, NP Cecilie Kicks, NP . Kathyrn Drown, NP  Thank you for choosing [facility]!!         Signed, Candee Furbish, MD  12/10/2017 3:34 PM    [place]

## 2017-12-10 NOTE — Patient Instructions (Signed)
Medication Instructions:  The current medical regimen is effective;  continue present plan and medications.  If you need a refill on your cardiac medications before your next appointment, please call your pharmacy.   Follow-Up: At [facility], you and your health needs are our priority.  As part of our continuing mission to provide you with exceptional heart care, we have created designated Provider Care Teams.  These Care Teams include your primary Cardiologist (physician) and Advanced Practice Providers (APPs -  Physician Assistants and Nurse Practitioners) who all work together to provide you with the care you need, when you need it. You will need a follow up appointment in 12 months.  Please call our office 2 months in advance to schedule this appointment.  You may see Dr Skains. or one of the following Advanced Practice Providers on your designated Care Team:   Lori Gerhardt, NP Laura Ingold, NP . Jill McDaniel, NP  Thank you for choosing [facility]!!      

## 2017-12-11 NOTE — Telephone Encounter (Signed)
Outpatient Medication Detail    Disp Refills Start End   lisinopril (PRINIVIL,ZESTRIL) 5 MG tablet 90 tablet 3 12/10/2017    Sig - Route: Take 1 tablet (5 mg total) by mouth daily. - Oral   Sent to pharmacy as: lisinopril (PRINIVIL,ZESTRIL) 5 MG tablet   E-Prescribing Status: Receipt confirmed by pharmacy (12/10/2017 3:10 PM EST)   Pharmacy   [facility], [place] - [id number]

## 2017-12-14 DIAGNOSIS — M65322 Trigger finger, left index finger: Secondary | ICD-10-CM | POA: Diagnosis not present

## 2018-01-07 DIAGNOSIS — R6889 Other general symptoms and signs: Secondary | ICD-10-CM | POA: Diagnosis not present

## 2018-01-07 DIAGNOSIS — E119 Type 2 diabetes mellitus without complications: Secondary | ICD-10-CM | POA: Diagnosis not present

## 2018-01-07 DIAGNOSIS — Z9889 Other specified postprocedural states: Secondary | ICD-10-CM | POA: Diagnosis not present

## 2018-01-07 DIAGNOSIS — H01003 Unspecified blepharitis right eye, unspecified eyelid: Secondary | ICD-10-CM | POA: Diagnosis not present

## 2018-01-07 DIAGNOSIS — Z9842 Cataract extraction status, left eye: Secondary | ICD-10-CM | POA: Diagnosis not present

## 2018-01-07 DIAGNOSIS — H17813 Minor opacity of cornea, bilateral: Secondary | ICD-10-CM | POA: Diagnosis not present

## 2018-01-07 DIAGNOSIS — Z961 Presence of intraocular lens: Secondary | ICD-10-CM | POA: Diagnosis not present

## 2018-01-07 DIAGNOSIS — Z9841 Cataract extraction status, right eye: Secondary | ICD-10-CM | POA: Diagnosis not present

## 2018-01-07 DIAGNOSIS — H01006 Unspecified blepharitis left eye, unspecified eyelid: Secondary | ICD-10-CM | POA: Diagnosis not present

## 2018-01-07 DIAGNOSIS — Z7984 Long term (current) use of oral hypoglycemic drugs: Secondary | ICD-10-CM | POA: Diagnosis not present

## 2018-01-07 DIAGNOSIS — H04123 Dry eye syndrome of bilateral lacrimal glands: Secondary | ICD-10-CM | POA: Diagnosis not present

## 2018-01-17 DIAGNOSIS — J069 Acute upper respiratory infection, unspecified: Secondary | ICD-10-CM | POA: Diagnosis not present

## 2018-01-18 DIAGNOSIS — Z09 Encounter for follow-up examination after completed treatment for conditions other than malignant neoplasm: Secondary | ICD-10-CM | POA: Diagnosis not present

## 2018-01-25 DIAGNOSIS — M65322 Trigger finger, left index finger: Secondary | ICD-10-CM | POA: Diagnosis not present

## 2018-01-28 DIAGNOSIS — M21769 Unequal limb length (acquired), unspecified tibia and fibula: Secondary | ICD-10-CM | POA: Diagnosis not present

## 2018-01-28 DIAGNOSIS — T8789 Other complications of amputation stump: Secondary | ICD-10-CM | POA: Diagnosis not present

## 2018-01-28 DIAGNOSIS — Z89511 Acquired absence of right leg below knee: Secondary | ICD-10-CM | POA: Diagnosis not present

## 2018-02-11 DIAGNOSIS — I10 Essential (primary) hypertension: Secondary | ICD-10-CM | POA: Diagnosis not present

## 2018-02-11 DIAGNOSIS — Z0000 Encounter for general adult medical examination without abnormal findings: Secondary | ICD-10-CM | POA: Diagnosis not present

## 2018-02-11 DIAGNOSIS — K219 Gastro-esophageal reflux disease without esophagitis: Secondary | ICD-10-CM | POA: Diagnosis not present

## 2018-02-11 DIAGNOSIS — Z8546 Personal history of malignant neoplasm of prostate: Secondary | ICD-10-CM | POA: Diagnosis not present

## 2018-02-11 DIAGNOSIS — E7800 Pure hypercholesterolemia, unspecified: Secondary | ICD-10-CM | POA: Diagnosis not present

## 2018-02-11 DIAGNOSIS — E1149 Type 2 diabetes mellitus with other diabetic neurological complication: Secondary | ICD-10-CM | POA: Diagnosis not present

## 2018-02-11 DIAGNOSIS — E039 Hypothyroidism, unspecified: Secondary | ICD-10-CM | POA: Diagnosis not present

## 2018-02-11 DIAGNOSIS — I2510 Atherosclerotic heart disease of native coronary artery without angina pectoris: Secondary | ICD-10-CM | POA: Diagnosis not present

## 2018-02-11 DIAGNOSIS — Z1389 Encounter for screening for other disorder: Secondary | ICD-10-CM | POA: Diagnosis not present

## 2018-03-08 DIAGNOSIS — H6981 Other specified disorders of Eustachian tube, right ear: Secondary | ICD-10-CM | POA: Diagnosis not present

## 2018-08-13 DIAGNOSIS — E1149 Type 2 diabetes mellitus with other diabetic neurological complication: Secondary | ICD-10-CM | POA: Diagnosis not present

## 2018-08-13 DIAGNOSIS — G4700 Insomnia, unspecified: Secondary | ICD-10-CM | POA: Diagnosis not present

## 2018-08-13 DIAGNOSIS — I10 Essential (primary) hypertension: Secondary | ICD-10-CM | POA: Diagnosis not present

## 2018-08-15 DIAGNOSIS — E1149 Type 2 diabetes mellitus with other diabetic neurological complication: Secondary | ICD-10-CM | POA: Diagnosis not present

## 2018-09-10 DIAGNOSIS — Z23 Encounter for immunization: Secondary | ICD-10-CM | POA: Diagnosis not present

## 2018-10-14 DIAGNOSIS — M65331 Trigger finger, right middle finger: Secondary | ICD-10-CM | POA: Diagnosis not present

## 2018-10-15 ENCOUNTER — Ambulatory Visit (INDEPENDENT_AMBULATORY_CARE_PROVIDER_SITE_OTHER): Payer: Medicare Other

## 2018-10-15 ENCOUNTER — Ambulatory Visit (INDEPENDENT_AMBULATORY_CARE_PROVIDER_SITE_OTHER): Payer: Medicare Other | Admitting: Orthopaedic Surgery

## 2018-10-15 ENCOUNTER — Other Ambulatory Visit: Payer: Self-pay

## 2018-10-15 DIAGNOSIS — M25511 Pain in right shoulder: Secondary | ICD-10-CM | POA: Diagnosis not present

## 2018-10-15 DIAGNOSIS — M7541 Impingement syndrome of right shoulder: Secondary | ICD-10-CM | POA: Diagnosis not present

## 2018-10-15 MED ORDER — LIDOCAINE HCL 1 % IJ SOLN
3.0000 mL | INTRAMUSCULAR | Status: AC | PRN
  Administered 2018-10-15: 3 mL

## 2018-10-15 MED ORDER — METHYLPREDNISOLONE ACETATE 40 MG/ML IJ SUSP
40.0000 mg | INTRAMUSCULAR | Status: AC | PRN
  Administered 2018-10-15: 40 mg via INTRA_ARTICULAR

## 2018-10-15 NOTE — Progress Notes (Signed)
Office Visit Note   Patient: [name]           Date of Birth: [birth year]           MRN: [id number] Visit Date: 10/15/2018              Requested by: [name], MD [address] [place],  [place] [id number] PCP: [name], MD   Assessment & Plan: Visit Diagnoses:  1. Right shoulder pain, unspecified chronicity   2. Impingement syndrome of right shoulder     Plan: I spoke with him about trying an injection in the subacromial space with a steroid to see if this will help alleviate his symptoms.  He agrees with this.  I told him to watch his blood glucose closely because certainly this can elevate blood sugars.  He did tolerate the injection well.  All question concerns were answered and addressed.  Follow-up will be as needed.  I did let him know though that if his pain does not improve significantly that we would want to see him back in the office likely with Dr. Junius Roads to provide an ultrasound-guided injection in his right [facility] joint.  Follow-Up Instructions: Return if symptoms worsen or fail to improve.   Orders:  Orders Placed This Encounter  Procedures  . Large Joint Inj  . XR Shoulder Right   No orders of the defined types were placed in this encounter.     Procedures: Large Joint Inj: R subacromial bursa on 10/15/2018 11:01 AM Indications: pain and diagnostic evaluation Details: 22 G 1.5 in needle  Arthrogram: No  Medications: 3 mL lidocaine 1 %; 40 mg methylPREDNISolone acetate 40 MG/ML Outcome: tolerated well, no immediate complications Procedure, treatment alternatives, risks and benefits explained, specific risks discussed. Consent was given by the patient. Immediately prior to procedure a time out was called to verify the correct patient, procedure, equipment, support staff and site/side marked as required. Patient was prepped and draped in the usual sterile fashion.       Clinical Data: No additional findings.   Subjective: Chief  Complaint  Patient presents with  . Right Shoulder - Pain  The patient comes in today for evaluation treatment of a 2 to 3-week history of right shoulder pain.  He says is very painful with reaching above and behind him and overhead.  He has no known injury.  It does wake him up at night.  He does have a right below-knee prosthesis.  He says putting that on and off does not give him any problems at all in terms of pain with his right shoulder.  He denies any neck pain he denies any numbness and tingling in his right arm.  He is a diabetic and reports a hemoglobin A1c of under 7.  He is never had surgery on the shoulder either.  HPI  Review of Systems He currently denies any headache, chest pain, shortness of breath, fever, chills, nausea, vomiting  Objective: Vital Signs: There were no vitals taken for this visit.  Physical Exam He is alert and orient x3 and in no acute distress Ortho Exam Examination of his right shoulder shows his range of motion is entirely full and he has excellent strength of the rotator cuff.  He does have positive signs of impingement. Specialty Comments:  No specialty comments available.  Imaging: Xr Shoulder Right  Result Date: 10/15/2018 3 views of the right shoulder show no acute findings.  There is slight decrease in  subacromial outlet but there is moderate to severe AC joint arthritis.    PMFS History: Patient Active Problem List   Diagnosis Date Noted  . S/P transmetatarsal amputation of foot, left ([place]) 04/25/2016  . Chronic osteomyelitis of toe, left ([place])   . Cellulitis of toe of left foot   . Chest pain 09/25/2015  . CAD (coronary artery disease) of artery bypass graft 09/25/2015  . Sinus bradycardia on ECG 09/25/2015  . Chronic diastolic CHF (congestive heart failure) ([place]) 09/25/2015  . Chest pain at rest 09/25/2015  . Aortic valvular disorder 08/04/2014  . Disorder of mitral valve 08/04/2014  . Diabetic foot infection ([place]) 06/03/2014  .  Peripheral vascular disease ([place]) 06/03/2014  . Abscess of foot 06/03/2014  . CAD (coronary artery disease) 07/29/2013  . Trifascicular block 07/29/2013  . 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 Hypothyroidism 01/22/2007  . Type 2 diabetes mellitus with vascular disease ([place]) 01/22/2007  . HYPERLIPIDEMIA, MIXED 01/22/2007  . MYOCARDIAL INFARCTION, HX OF 01/22/2007  . DEGENERATIVE JOINT DISEASE 01/22/2007  . ANEMIA, IRON DEFICIENCY, HX OF 01/22/2007   Past Medical History:  Diagnosis Date  . Coronary artery disease    a. remote stenting to ramus, prox LAD x2.  . Dental crowns present   . Diabetes mellitus    diet-controlled  . First degree AV block   . GERD (gastroesophageal reflux disease)   . Heart attack ([place]) 06/2004  . Heart murmur    aortic  . Hx MRSA infection 02/2006  . Hyperlipidemia   . Hypertension    hx. of - has not been on med. since losing wt. after gastric bypass  . Hypothyroidism   . Morbid obesity ([place])   . Mucoid cyst of joint 09/2011   left thumb  . Sleep apnea    sleep study 03/29/2011; no CPAP use  . Trifascicular block     Family History  Problem Relation Age of Onset  . Heart disease Mother     Past Surgical History:  Procedure Laterality Date  . AMPUTATION Left 04/19/2016   Procedure: Left Foot 4th Toe Amputation vs. Transmetatarsal;  Surgeon: [name], MD;  Location: [place];  Service: Orthopedics;  Laterality: Left;  . CATARACT EXTRACTION  02/2010; 03/2010  . COLONOSCOPY WITH PROPOFOL N/A 09/14/2014   Procedure: COLONOSCOPY WITH PROPOFOL;  Surgeon: Garlan Fair, MD;  [facility];  Service: Endoscopy;  Laterality: N/A;  . CORONARY ANGIOPLASTY WITH STENT PLACEMENT  07/12/2004; 08/03/2004   total of 3 stents  . I&D EXTREMITY Right 06/04/2014   Procedure: IRRIGATION AND DEBRIDEMENT EXTREMITY;  Surgeon: [name], MD;  Location: [place];  Service: Orthopedics;  Laterality: Right;  . MASS EXCISION  10/04/2011    Procedure: EXCISION MASS;  Surgeon: Wynonia Sours, MD;  Location: [place];  Service: Orthopedics;  Laterality: Left;  excision cyst debridment ip joint of left thumb  . PROSTATECTOMY    . ROUX-EN-Y GASTRIC BYPASS  10/10/2010   laparoscopic  . TOE AMPUTATION  02/23/2006   left foot second ray amputation   Social History   Occupational History  . Occupation: Engineer, maintenance (IT) taxes  Tobacco Use  . Smoking status: Never Smoker  . Smokeless tobacco: Never Used  Substance and Sexual Activity  . Alcohol use: No  . Drug use: No  . Sexual activity: Not on file

## 2018-10-23 DIAGNOSIS — I10 Essential (primary) hypertension: Secondary | ICD-10-CM | POA: Diagnosis not present

## 2018-10-23 DIAGNOSIS — E1149 Type 2 diabetes mellitus with other diabetic neurological complication: Secondary | ICD-10-CM | POA: Diagnosis not present

## 2018-10-23 DIAGNOSIS — M1990 Unspecified osteoarthritis, unspecified site: Secondary | ICD-10-CM | POA: Diagnosis not present

## 2018-10-23 DIAGNOSIS — E039 Hypothyroidism, unspecified: Secondary | ICD-10-CM | POA: Diagnosis not present

## 2018-10-23 DIAGNOSIS — N400 Enlarged prostate without lower urinary tract symptoms: Secondary | ICD-10-CM | POA: Diagnosis not present

## 2018-10-23 DIAGNOSIS — E11621 Type 2 diabetes mellitus with foot ulcer: Secondary | ICD-10-CM | POA: Diagnosis not present

## 2018-10-23 DIAGNOSIS — Z7984 Long term (current) use of oral hypoglycemic drugs: Secondary | ICD-10-CM | POA: Diagnosis not present

## 2018-10-23 DIAGNOSIS — I2510 Atherosclerotic heart disease of native coronary artery without angina pectoris: Secondary | ICD-10-CM | POA: Diagnosis not present

## 2018-10-23 DIAGNOSIS — Z8546 Personal history of malignant neoplasm of prostate: Secondary | ICD-10-CM | POA: Diagnosis not present

## 2018-10-23 DIAGNOSIS — E7800 Pure hypercholesterolemia, unspecified: Secondary | ICD-10-CM | POA: Diagnosis not present

## 2018-11-02 ENCOUNTER — Other Ambulatory Visit: Payer: Self-pay | Admitting: Cardiology

## 2018-11-27 DIAGNOSIS — M65331 Trigger finger, right middle finger: Secondary | ICD-10-CM | POA: Diagnosis not present

## 2018-12-16 ENCOUNTER — Encounter: Payer: Self-pay | Admitting: Cardiology

## 2018-12-16 ENCOUNTER — Ambulatory Visit (INDEPENDENT_AMBULATORY_CARE_PROVIDER_SITE_OTHER): Payer: Medicare Other | Admitting: Cardiology

## 2018-12-16 ENCOUNTER — Other Ambulatory Visit: Payer: Self-pay

## 2018-12-16 VITALS — BP 126/80 | HR 57 | Ht >= 80 in | Wt 238.0 lb

## 2018-12-16 DIAGNOSIS — I452 Bifascicular block: Secondary | ICD-10-CM | POA: Diagnosis not present

## 2018-12-16 DIAGNOSIS — E11621 Type 2 diabetes mellitus with foot ulcer: Secondary | ICD-10-CM | POA: Diagnosis not present

## 2018-12-16 DIAGNOSIS — E782 Mixed hyperlipidemia: Secondary | ICD-10-CM

## 2018-12-16 DIAGNOSIS — I10 Essential (primary) hypertension: Secondary | ICD-10-CM | POA: Diagnosis not present

## 2018-12-16 DIAGNOSIS — E039 Hypothyroidism, unspecified: Secondary | ICD-10-CM | POA: Diagnosis not present

## 2018-12-16 DIAGNOSIS — Z89511 Acquired absence of right leg below knee: Secondary | ICD-10-CM

## 2018-12-16 DIAGNOSIS — E1149 Type 2 diabetes mellitus with other diabetic neurological complication: Secondary | ICD-10-CM | POA: Diagnosis not present

## 2018-12-16 DIAGNOSIS — I2510 Atherosclerotic heart disease of native coronary artery without angina pectoris: Secondary | ICD-10-CM

## 2018-12-16 DIAGNOSIS — N400 Enlarged prostate without lower urinary tract symptoms: Secondary | ICD-10-CM | POA: Diagnosis not present

## 2018-12-16 DIAGNOSIS — Z8546 Personal history of malignant neoplasm of prostate: Secondary | ICD-10-CM | POA: Diagnosis not present

## 2018-12-16 DIAGNOSIS — E7800 Pure hypercholesterolemia, unspecified: Secondary | ICD-10-CM | POA: Diagnosis not present

## 2018-12-16 DIAGNOSIS — M1990 Unspecified osteoarthritis, unspecified site: Secondary | ICD-10-CM | POA: Diagnosis not present

## 2018-12-16 DIAGNOSIS — Z7984 Long term (current) use of oral hypoglycemic drugs: Secondary | ICD-10-CM | POA: Diagnosis not present

## 2018-12-16 NOTE — Progress Notes (Signed)
Cardiology Office Note:    Date:  12/16/2018   ID:  [name], DOB 06-19-1950, MRN [id number]  PCP:  [name], MD  Cardiologist:  Candee Furbish, MD  Electrophysiologist:  None   Referring MD: [name], MD     History of Present Illness:    [name] is a 68 y.o. male here for follow-up of CAD.  former patient of Dr. [name] with prior stent to ramus in 2006, proximal LAD x 2. His distal RCA was diffusely diseased.   There was a fixed inferior defect in 2012 on nuclear stress test. Also has diabetes hypertension hyperlipidemia and sleep apnea. On October 10, 2010 he underwent gastric bypass surgery by Dr. [name] Done. Excellent weight loss of approximately 130 pounds. He is off of his diabetic medications, he has decreased his Lipitor. He is not on any blood pressure medications.  He continues to have first degree A-V block, bifascicular block, sinus bradycardia. This has been present for several years. No high-risk symptoms such as syncope, significant shortness of breath or angina with exercise. He is to continue to monitor this and that it may result in a pacemaker in the future. During surgery, his heart rate was in the 30s at times. Anesthesiologist noted this. I observed another EKG showing heart rate of 48 with bifascicular block. Once again no high-risk symptoms. We have been watching this.  He states that he has had a heart murmur since childhood, mild mitral regurgitation on last echocardiogram in 2011.Doing well, no change in symptoms.  Has had his right lower extremity amputated after long-standing history of infection.   Still continues to do quite well without any fevers chills nausea vomiting.  Post amputation of right lower extremity after ongoing infection.  Utilizes a scooter.  12/16/2018-here for the follow-up of CAD AV block-no fevers chills nausea vomiting syncope bleeding.  Tolerating his medications well.  No angina.    Past Medical History:  Diagnosis  Date  . Coronary artery disease    a. remote stenting to ramus, prox LAD x2.  . Dental crowns present   . Diabetes mellitus    diet-controlled  . First degree AV block   . GERD (gastroesophageal reflux disease)   . Heart attack ([place]) 06/2004  . Heart murmur    aortic  . Hx MRSA infection 02/2006  . Hyperlipidemia   . Hypertension    hx. of - has not been on med. since losing wt. after gastric bypass  . Hypothyroidism   . Morbid obesity ([place])   . Mucoid cyst of joint 09/2011   left thumb  . Sleep apnea    sleep study 03/29/2011; no CPAP use  . Trifascicular block     Past Surgical History:  Procedure Laterality Date  . AMPUTATION Left 04/19/2016   Procedure: Left Foot 4th Toe Amputation vs. Transmetatarsal;  Surgeon: [name], MD;  Location: [place];  Service: Orthopedics;  Laterality: Left;  . CATARACT EXTRACTION  02/2010; 03/2010  . COLONOSCOPY WITH PROPOFOL N/A 09/14/2014   Procedure: COLONOSCOPY WITH PROPOFOL;  Surgeon: Garlan Fair, MD;  [facility];  Service: Endoscopy;  Laterality: N/A;  . CORONARY ANGIOPLASTY WITH STENT PLACEMENT  07/12/2004; 08/03/2004   total of 3 stents  . I&D EXTREMITY Right 06/04/2014   Procedure: IRRIGATION AND DEBRIDEMENT EXTREMITY;  Surgeon: [name], MD;  Location: [place];  Service: Orthopedics;  Laterality: Right;  . MASS EXCISION  10/04/2011   Procedure: EXCISION MASS;  Surgeon: Dillard Essex  Fredna Dow, MD;  Location: [place];  Service: Orthopedics;  Laterality: Left;  excision cyst debridment ip joint of left thumb  . PROSTATECTOMY    . ROUX-EN-Y GASTRIC BYPASS  10/10/2010   laparoscopic  . TOE AMPUTATION  02/23/2006   left foot second ray amputation    Current Medications: Current Meds  Medication Sig  . aspirin EC 81 MG tablet Take 81 mg by mouth daily.  Marland Kitchen atorvastatin (LIPITOR) 10 MG tablet Take 10 mg by mouth daily after supper.   Marland Kitchen CALCIUM CITRATE PO Take 500 mg by mouth 3 (three) times daily.  .  Cyanocobalamin (B-12) 500 MCG SUBL Place 500 mcg under the tongue every Monday, Wednesday, and Friday.   . ferrous sulfate 325 (65 FE) MG tablet Take 325 mg by mouth daily with breakfast.  . fluticasone (FLONASE) 50 MCG/ACT nasal spray Place 2 sprays into both nostrils daily as needed for allergies.   Marland Kitchen ibuprofen (ADVIL,MOTRIN) 200 MG tablet Take 400 mg by mouth every 6 (six) hours as needed for headache (pain).  Marland Kitchen levothyroxine (SYNTHROID, LEVOTHROID) 200 MCG tablet Take 200 mcg by mouth daily before breakfast.   . lisinopril (ZESTRIL) 5 MG tablet TAKE ONE TABLET DAILY FOR blood pressure  . metFORMIN (GLUCOPHAGE-XR) 500 MG 24 hr tablet Take 500 mg by mouth 2 (two) times daily.  . Multiple Vitamin (MULTIVITAMIN WITH MINERALS) TABS tablet Take 2 tablets by mouth 2 (two) times daily.  . Multiple Vitamins-Minerals (PRESERVISION AREDS 2) CAPS Take 1 capsule by mouth 2 (two) times daily.  . nitroGLYCERIN (NITROSTAT) 0.4 MG SL tablet Place 1 tablet (0.4 mg total) under the tongue every 5 (five) minutes x 3 doses as needed for chest pain.  . Omega-3 Fatty Acids (FISH OIL TRIPLE STRENGTH PO) Take 1 capsule by mouth daily.  . Omeprazole 20 MG TBEC Take 20 mg by mouth daily as needed (acid reflux/ indigestion).   Marland Kitchen OVER THE COUNTER MEDICATION Take 1 tablet by mouth 2 (two) times daily. [facility] Complex: glucosamine/cat's claw extract  . Probiotic Product (PROBIOTIC FORMULA PO) Take 2 tablets by mouth daily after supper.   . sildenafil (REVATIO) 20 MG tablet Take 40-100 mg by mouth daily as needed (Erectile Dysfunction).      Allergies:   Bactrim [sulfamethoxazole-trimethoprim], Moxifloxacin, Penicillins, and Quinolones   Social History   Socioeconomic History  . Marital status: Married    Spouse name: Not on file  . Number of children: Not on file  . Years of education: Not on file  . Highest education level: Not on file  Occupational History  . Occupation: Engineer, maintenance (IT) taxes  Social Needs  .  Financial resource strain: Not on file  . Food insecurity    Worry: Not on file    Inability: Not on file  . Transportation needs    Medical: Not on file    Non-medical: Not on file  Tobacco Use  . Smoking status: Never Smoker  . Smokeless tobacco: Never Used  Substance and Sexual Activity  . Alcohol use: No  . Drug use: No  . Sexual activity: Not on file  Lifestyle  . Physical activity    Days per week: Not on file    Minutes per session: Not on file  . Stress: Not on file  Relationships  . Social Herbalist on phone: Not on file    Gets together: Not on file    Attends religious service: Not on file  Active member of club or organization: Not on file    Attends meetings of clubs or organizations: Not on file    Relationship status: Not on file  Other Topics Concern  . Not on file  Social History Narrative   Parents and sister all using CPAP     Family History: The patient's family history includes Heart disease in his mother.  ROS:   Please see the history of present illness.     All other systems reviewed and are negative.  EKGs/Labs/Other Studies Reviewed:    The following studies were reviewed today:  ECHO 04/19/16:  - Left ventricle: The cavity size was normal. Wall thickness was increased in a pattern of moderate LVH. Systolic function was mildly reduced. The estimated ejection fraction was in the range of 45% to 50%. Inferior hypokinesis. Doppler parameters are consistent with abnormal left ventricular relaxation (grade 1 diastolic dysfunction). The E/e&' ratio is between 8-15, suggesting indeterminate LV filling pressure. - Aortic valve: Trileaflet; mildly calcified leaflets. Mild stenosis. There was mild regurgitation. Mean gradient (S): 14 mm Hg. Peak gradient (S): 27 mm Hg. - Aorta: Aortic root dimension: 42 mm (ED). He is 39foot 8in.  - Aortic root: The aortic root is mildly dilated. - Mitral valve: Mildly thickened  leaflets . There was mild regurgitation. - Left atrium: The atrium was normal in size. - Right ventricle: Systolic function is mildly reduced. - Right atrium: The atrium was normal in size. - Inferior vena cava: The vessel was normal in size. The respirophasic diameter changes were in the normal range (>= 50%), consistent with normal central venous pressure.  Impressions:  - Compared to a prior study in 2017, the LVEF is slightly worse at 45-50%. There is a suggestion of inferior wall hypokinesis.  EKG:  EKG is  ordered today.  The ekg ordered today demonstrates 12/16/2018-sinus bradycardia first-degree AV block right bundle branch block left anterior fascicular block PACs noted.  Sinus rhythm bifascicular block 64 personally reviewed and interpreted  Recent Labs: No results found for requested labs within last 8760 hours.  Recent Lipid Panel No results found for: CHOL, TRIG, HDL, CHOLHDL, VLDL, LDLCALC, LDLDIRECT  Physical Exam:    VS:  BP 126/80   Pulse (!) 57   Ht 6\' 8"  (2.032 m)   Wt 238 lb (108 kg)   SpO2 98%   BMI 26.15 kg/m     Wt Readings from Last 3 Encounters:  12/16/18 238 lb (108 kg)  12/10/17 237 lb (107.5 kg)  12/06/16 221 lb 12.8 oz (100.6 kg)     GEN: Well nourished, well developed, in no acute distress  HEENT: normal  Neck: no JVD, carotid bruits, or masses Cardiac: RRR; soft systolic murmur, no rubs, or gallops,no edema  Respiratory:  clear to auscultation bilaterally, normal work of breathing GI: soft, nontender, nondistended, + BS MS: no deformity or atrophy  Skin: warm and dry, no rash Neuro:  Alert and Oriented x 3, Strength and sensation are intact Psych: euthymic mood, full affect   ASSESSMENT:    1. Coronary artery disease involving native coronary artery of native heart without angina pectoris   2. Bifascicular block   3. HYPERLIPIDEMIA, MIXED   4. Status post below-knee amputation of right lower extremity (HCC)    PLAN:     In order of problems listed above:  Coronary artery disease - 3 prior total stents with ramus, LAD proximal with diffusely diseased RCA. - Aggressive secondary prevention.  Aspirin.  Statin.   -  he is currently not on beta-blocker because of bifascicular block.  Avoiding AV nodal blocking agents. -Last nuclear stress test no ischemia, stable no anginal symptoms currently.  Old MI - Lisinopril 5 mg once a day.   Creatinine normal.  EF 45 to 50%.  Hyperlipidemia -On low-dose atorvastatin, prior low LDL.  Does not desire to be on higher intensity statin.  LDL 66.  Excellent.  Bifascicular block/first-degree AV block-PR interval 250 - No high risk symptoms, syncope for instance.  Right bundle branch block discussed potential pacemaker need in the future.  Avoiding AV nodal blocking agents.  Heart murmur - Likely secondary to aortic valve calcification as well as mitral regurgitation.  We still here on exam.  Potentially repeat echocardiogram in the future.  Right below-knee amputation -Prior Charcot joint.  Extensive wound treatment prior to this.  He is doing very well with this.  1 year follow-up.    Medication Adjustments/Labs and Tests Ordered: Current medicines are reviewed at length with the patient today.  Concerns regarding medicines are outlined above.  No orders of the defined types were placed in this encounter.  No orders of the defined types were placed in this encounter.   Patient Instructions  Medication Instructions:  The current medical regimen is effective;  continue present plan and medications.  *If you need a refill on your cardiac medications before your next appointment, please call your pharmacy*  Follow-Up: At [facility], you and your health needs are our priority.  As part of our continuing mission to provide you with exceptional heart care, we have created designated Provider Care Teams.  These Care Teams include your primary Cardiologist  (physician) and Advanced Practice Providers (APPs -  Physician Assistants and Nurse Practitioners) who all work together to provide you with the care you need, when you need it.  Your next appointment:   1 year(s)  The format for your next appointment:   In Person  Provider:   Candee Furbish, MD  Thank you for choosing [facility]!!        Signed, Candee Furbish, MD  12/16/2018 10:18 AM    [place]

## 2018-12-16 NOTE — Patient Instructions (Signed)
Medication Instructions:  The current medical regimen is effective;  continue present plan and medications.  *If you need a refill on your cardiac medications before your next appointment, please call your pharmacy*  Follow-Up: At [facility], you and your health needs are our priority.  As part of our continuing mission to provide you with exceptional heart care, we have created designated Provider Care Teams.  These Care Teams include your primary Cardiologist (physician) and Advanced Practice Providers (APPs -  Physician Assistants and Nurse Practitioners) who all work together to provide you with the care you need, when you need it.  Your next appointment:   1 year(s)  The format for your next appointment:   In Person  Provider:   Mark Skains, MD  Thank you for choosing [facility]!!      

## 2018-12-25 DIAGNOSIS — Z9884 Bariatric surgery status: Secondary | ICD-10-CM | POA: Diagnosis not present

## 2019-01-03 NOTE — Addendum Note (Signed)
Addended by: Maren Beach, Shawnelle Spoerl A on: 01/03/2019 05:25 PM   Modules accepted: Orders

## 2019-01-12 DIAGNOSIS — E11621 Type 2 diabetes mellitus with foot ulcer: Secondary | ICD-10-CM | POA: Diagnosis not present

## 2019-01-12 DIAGNOSIS — N400 Enlarged prostate without lower urinary tract symptoms: Secondary | ICD-10-CM | POA: Diagnosis not present

## 2019-01-12 DIAGNOSIS — I2510 Atherosclerotic heart disease of native coronary artery without angina pectoris: Secondary | ICD-10-CM | POA: Diagnosis not present

## 2019-01-12 DIAGNOSIS — M1990 Unspecified osteoarthritis, unspecified site: Secondary | ICD-10-CM | POA: Diagnosis not present

## 2019-01-12 DIAGNOSIS — E1149 Type 2 diabetes mellitus with other diabetic neurological complication: Secondary | ICD-10-CM | POA: Diagnosis not present

## 2019-01-12 DIAGNOSIS — Z7984 Long term (current) use of oral hypoglycemic drugs: Secondary | ICD-10-CM | POA: Diagnosis not present

## 2019-01-12 DIAGNOSIS — I10 Essential (primary) hypertension: Secondary | ICD-10-CM | POA: Diagnosis not present

## 2019-01-12 DIAGNOSIS — Z8546 Personal history of malignant neoplasm of prostate: Secondary | ICD-10-CM | POA: Diagnosis not present

## 2019-01-12 DIAGNOSIS — E7800 Pure hypercholesterolemia, unspecified: Secondary | ICD-10-CM | POA: Diagnosis not present

## 2019-01-12 DIAGNOSIS — E039 Hypothyroidism, unspecified: Secondary | ICD-10-CM | POA: Diagnosis not present

## 2019-01-20 ENCOUNTER — Encounter: Payer: Self-pay | Admitting: Orthopaedic Surgery

## 2019-01-27 ENCOUNTER — Other Ambulatory Visit: Payer: Self-pay

## 2019-01-27 ENCOUNTER — Ambulatory Visit (INDEPENDENT_AMBULATORY_CARE_PROVIDER_SITE_OTHER): Payer: Medicare Other | Admitting: Orthopaedic Surgery

## 2019-01-27 ENCOUNTER — Ambulatory Visit (INDEPENDENT_AMBULATORY_CARE_PROVIDER_SITE_OTHER): Payer: Medicare Other

## 2019-01-27 ENCOUNTER — Encounter: Payer: Self-pay | Admitting: Orthopaedic Surgery

## 2019-01-27 DIAGNOSIS — M5432 Sciatica, left side: Secondary | ICD-10-CM

## 2019-01-27 DIAGNOSIS — G8929 Other chronic pain: Secondary | ICD-10-CM | POA: Diagnosis not present

## 2019-01-27 DIAGNOSIS — M545 Low back pain, unspecified: Secondary | ICD-10-CM

## 2019-01-27 DIAGNOSIS — M4807 Spinal stenosis, lumbosacral region: Secondary | ICD-10-CM

## 2019-01-27 MED ORDER — GABAPENTIN 300 MG PO CAPS: 300.0000 mg | ORAL_CAPSULE | Freq: Two times a day (BID) | ORAL | 1 refills | Status: DC

## 2019-01-27 MED ORDER — METHOCARBAMOL 500 MG PO TABS: 500.0000 mg | ORAL_TABLET | Freq: Four times a day (QID) | ORAL | 1 refills | Status: DC | PRN

## 2019-01-27 NOTE — Progress Notes (Signed)
Office Visit Note   Patient: [name]           Date of Birth: 01-Nov-1950           MRN: [id number] Visit Date: 01/27/2019              Requested by: [name], MD [place] [address],  [place] [address] PCP: [name], MD   Assessment & Plan: Visit Diagnoses:  1. Chronic bilateral low back pain, unspecified whether sciatica present   2. Sciatica, left side     Plan: I am going to start him off on Neurontin 300 mg to take at bedtime for the next 5 days and he can increase this to twice a day.  I am also to try Robaxin as a muscle relaxant because he is also having some left-sided neck pain.  At this point given the severity of the pain in his lumbar spine and his sciatica combined with his x-ray findings I would like to obtain an MRI to assess for nerve impingement and the degree of stenosis.  This will help guide a treatment plan.  We will see him back after the MRI is obtained of the lumbar spine.  All question concerns were answered and addressed.  Follow-Up Instructions: Return in about 2 weeks (around 02/10/2019).   Orders:  Orders Placed This Encounter  Procedures  . XR Lumbar Spine 2-3 Views   Meds ordered this encounter  Medications  . gabapentin (NEURONTIN) 300 MG capsule    Sig: Take 1 capsule (300 mg total) by mouth 2 (two) times daily.    Dispense:  60 capsule    Refill:  1  . methocarbamol (ROBAXIN) 500 MG tablet    Sig: Take 1 tablet (500 mg total) by mouth every 6 (six) hours as needed for muscle spasms.    Dispense:  60 tablet    Refill:  1      Procedures: No procedures performed   Clinical Data: No additional findings.   Subjective: Chief Complaint  Patient presents with  . Lower Back - Pain  Patient is well-known to me.  Is a 69 year old gentleman who has a history of a right below-knee amputation.  He comes in with low back pain that is been getting worse for the last several weeks now.  He has had chronic sciatic issues  on the left side for a long period of time.  He then developed pain across the lower aspect of lumbar spine on both the left and right sides.  The left side does radiate down to his knee.  He does not feel that his below-knee amputation prosthesis is an issue at all.  He is a diabetic and does report his blood glucose in the 150s this morning.  He understands we would be hesitant to give him any oral steroids because of that.  He has tried and failed other forms of conservative treatment.  He is worked on back extension exercises as well as activity modification.  He tried anti-inflammatories and time as well as rest, ice and heat.  HPI  Review of Systems He currently denies any headache, chest pain, shortness of breath, fever, chills, nausea, vomiting  Objective: Vital Signs: There were no vitals taken for this visit.  Physical Exam He is alert and oriented x3 and in no acute distress Ortho Exam Examination of both legs shows a negative straight leg raise.  He has fluid range of motion of both hips.  He does have pain on sciatic stretch on the left and right sides.  He is pain is quite significant with flexion extension of the lumbar spine. Specialty Comments:  No specialty comments available.  Imaging: XR Lumbar Spine 2-3 Views  Result Date: 01/27/2019 2 views of the lumbar spine show severe degenerative changes at multiple levels.    PMFS History: Patient Active Problem List   Diagnosis Date Noted  . S/P transmetatarsal amputation of foot, left ([place]) 04/25/2016  . Chronic osteomyelitis of toe, left ([place])   . Cellulitis of toe of left foot   . Chest pain 09/25/2015  . CAD (coronary artery disease) of artery bypass graft 09/25/2015  . Sinus bradycardia on ECG 09/25/2015  . Chronic diastolic CHF (congestive heart failure) ([place]) 09/25/2015  . Chest pain at rest 09/25/2015  . Aortic valvular disorder 08/04/2014  . Disorder of mitral valve 08/04/2014  . Diabetic foot infection ([place])  06/03/2014  . Peripheral vascular disease ([place]) 06/03/2014  . Abscess of foot 06/03/2014  . CAD (coronary artery disease) 07/29/2013  . Trifascicular block 07/29/2013  . 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 Hypothyroidism 01/22/2007  . Type 2 diabetes mellitus with vascular disease ([place]) 01/22/2007  . HYPERLIPIDEMIA, MIXED 01/22/2007  . MYOCARDIAL INFARCTION, HX OF 01/22/2007  . DEGENERATIVE JOINT DISEASE 01/22/2007  . ANEMIA, IRON DEFICIENCY, HX OF 01/22/2007   Past Medical History:  Diagnosis Date  . Coronary artery disease    a. remote stenting to ramus, prox LAD x2.  . Dental crowns present   . Diabetes mellitus    diet-controlled  . First degree AV block   . GERD (gastroesophageal reflux disease)   . Heart attack ([place]) 06/2004  . Heart murmur    aortic  . Hx MRSA infection 02/2006  . Hyperlipidemia   . Hypertension    hx. of - has not been on med. since losing wt. after gastric bypass  . Hypothyroidism   . Morbid obesity ([place])   . Mucoid cyst of joint 09/2011   left thumb  . Sleep apnea    sleep study 03/29/2011; no CPAP use  . Trifascicular block     Family History  Problem Relation Age of Onset  . Heart disease Mother     Past Surgical History:  Procedure Laterality Date  . AMPUTATION Left 04/19/2016   Procedure: Left Foot 4th Toe Amputation vs. Transmetatarsal;  Surgeon: [name], MD;  Location: [place];  Service: Orthopedics;  Laterality: Left;  . CATARACT EXTRACTION  02/2010; 03/2010  . COLONOSCOPY WITH PROPOFOL N/A 09/14/2014   Procedure: COLONOSCOPY WITH PROPOFOL;  Surgeon: Garlan Fair, MD;  [facility];  Service: Endoscopy;  Laterality: N/A;  . CORONARY ANGIOPLASTY WITH STENT PLACEMENT  07/12/2004; 08/03/2004   total of 3 stents  . I & D EXTREMITY Right 06/04/2014   Procedure: IRRIGATION AND DEBRIDEMENT EXTREMITY;  Surgeon: [name], MD;  Location: [place];  Service: Orthopedics;  Laterality: Right;  . MASS EXCISION   10/04/2011   Procedure: EXCISION MASS;  Surgeon: Wynonia Sours, MD;  Location: [place];  Service: Orthopedics;  Laterality: Left;  excision cyst debridment ip joint of left thumb  . PROSTATECTOMY    . ROUX-EN-Y GASTRIC BYPASS  10/10/2010   laparoscopic  . TOE AMPUTATION  02/23/2006   left foot second ray amputation   Social History   Occupational History  . Occupation: Engineer, maintenance (IT) taxes  Tobacco Use  . Smoking  status: Never Smoker  . Smokeless tobacco: Never Used  Substance and Sexual Activity  . Alcohol use: No  . Drug use: No  . Sexual activity: Not on file

## 2019-01-28 ENCOUNTER — Telehealth: Payer: Self-pay | Admitting: Orthopaedic Surgery

## 2019-01-28 NOTE — Telephone Encounter (Signed)
Spoke with pharmacy and said they will call patient to let them know Rx is ready and he can get them there

## 2019-01-28 NOTE — Telephone Encounter (Signed)
Patient called advised he can not get [facility]. Patient said they will not answer the phone. Patient asked if the [facility] on [address] ph# to pharmacy is [phone] The number to contact patient is [phone]

## 2019-01-28 NOTE — Addendum Note (Signed)
Addended by: Maren Beach, Jovanka Westgate A on: 01/28/2019 11:12 AM   Modules accepted: Orders

## 2019-01-29 ENCOUNTER — Telehealth: Payer: Self-pay

## 2019-01-29 NOTE — Telephone Encounter (Signed)
Patient aware I just [facility] instead

## 2019-01-29 NOTE — Telephone Encounter (Signed)
Patient called concerning [facility] [address].  Advised patient of last message in chart and patient stated that he spoke with the pharmacy and that they have not received anything.  Cb# [phone].  Please advise.  Thank you

## 2019-01-29 NOTE — Telephone Encounter (Signed)
Patient called again regarding rx. Advised per your note from 01/05 that pharmacy stated that rx was ready. Patient states that they are telling him it is not. Please call him to further discuss/advise.

## 2019-02-06 ENCOUNTER — Other Ambulatory Visit: Payer: Medicare Other

## 2019-02-11 ENCOUNTER — Ambulatory Visit
Admission: RE | Admit: 2019-02-11 | Discharge: 2019-02-11 | Disposition: A | Discharge status: Home / Self Care | Payer: Medicare Other | Source: Ambulatory Visit | Attending: Orthopaedic Surgery | Admitting: Orthopaedic Surgery

## 2019-02-11 ENCOUNTER — Other Ambulatory Visit: Payer: Self-pay

## 2019-02-11 DIAGNOSIS — M48061 Spinal stenosis, lumbar region without neurogenic claudication: Secondary | ICD-10-CM | POA: Diagnosis not present

## 2019-02-11 DIAGNOSIS — M4807 Spinal stenosis, lumbosacral region: Secondary | ICD-10-CM

## 2019-02-12 ENCOUNTER — Ambulatory Visit: Payer: Medicare Other | Admitting: Orthopaedic Surgery

## 2019-02-17 ENCOUNTER — Ambulatory Visit (INDEPENDENT_AMBULATORY_CARE_PROVIDER_SITE_OTHER): Payer: Medicare Other | Admitting: Orthopaedic Surgery

## 2019-02-17 ENCOUNTER — Other Ambulatory Visit: Payer: Self-pay

## 2019-02-17 ENCOUNTER — Encounter: Payer: Self-pay | Admitting: Orthopaedic Surgery

## 2019-02-17 DIAGNOSIS — E1149 Type 2 diabetes mellitus with other diabetic neurological complication: Secondary | ICD-10-CM | POA: Diagnosis not present

## 2019-02-17 DIAGNOSIS — M5432 Sciatica, left side: Secondary | ICD-10-CM | POA: Diagnosis not present

## 2019-02-17 DIAGNOSIS — Z1211 Encounter for screening for malignant neoplasm of colon: Secondary | ICD-10-CM | POA: Diagnosis not present

## 2019-02-17 DIAGNOSIS — I2510 Atherosclerotic heart disease of native coronary artery without angina pectoris: Secondary | ICD-10-CM | POA: Diagnosis not present

## 2019-02-17 DIAGNOSIS — Z1389 Encounter for screening for other disorder: Secondary | ICD-10-CM | POA: Diagnosis not present

## 2019-02-17 DIAGNOSIS — E7800 Pure hypercholesterolemia, unspecified: Secondary | ICD-10-CM | POA: Diagnosis not present

## 2019-02-17 DIAGNOSIS — M4807 Spinal stenosis, lumbosacral region: Secondary | ICD-10-CM

## 2019-02-17 DIAGNOSIS — I10 Essential (primary) hypertension: Secondary | ICD-10-CM | POA: Diagnosis not present

## 2019-02-17 DIAGNOSIS — Z8546 Personal history of malignant neoplasm of prostate: Secondary | ICD-10-CM | POA: Diagnosis not present

## 2019-02-17 DIAGNOSIS — E039 Hypothyroidism, unspecified: Secondary | ICD-10-CM | POA: Diagnosis not present

## 2019-02-17 DIAGNOSIS — Z0000 Encounter for general adult medical examination without abnormal findings: Secondary | ICD-10-CM | POA: Diagnosis not present

## 2019-02-17 NOTE — Progress Notes (Signed)
The patient comes in today to go over an MRI of his lumbar spine.  He is a tall and thin individual who had severe pain in his lumbar spine when he came in to see me.  He said it has since eased off.  We sent him for an MRI because at age 69 we were worried about a compression fracture.  He says he still has some back pain but he points to facet joint areas of the paraspinal muscles at the lower aspect of his lumbar spine.  He denies any radicular symptoms at all and he says has gotten significantly better since he saw me last.  On exam his only pain is to palpation of the paraspinal muscles to the lower lumbar spine but not in the midline.  He has just minimal pain on flexion and extension and no radicular symptoms.  MRIs reviewed with him.  There is extensive edema in the posterior vertebral body of L3 which the radiologist feels is more of a Schmorl's node type of area.  The patient has no history of metastasis or any other lesions that he is aware of and no recent illnesses.  There is no other lesions in the lumbar spine at all and given the fact that his symptoms have significantly subsided this point is less of this being a metastatic process.  Since he is doing so much better right now were going to treat him conservatively.  Obviously if things worsen he will let us know because I would send him to potentially Dr. Ernestina Patches for facet joint injections if needed.  All question concerns were answered and addressed.

## 2019-02-23 ENCOUNTER — Ambulatory Visit: Payer: Medicare Other

## 2019-02-28 ENCOUNTER — Ambulatory Visit: Payer: Medicare Other

## 2019-03-02 ENCOUNTER — Ambulatory Visit: Payer: Medicare Other

## 2019-03-07 ENCOUNTER — Other Ambulatory Visit: Payer: Self-pay | Admitting: Cardiology

## 2019-03-10 DIAGNOSIS — M65331 Trigger finger, right middle finger: Secondary | ICD-10-CM | POA: Diagnosis not present

## 2019-03-25 ENCOUNTER — Other Ambulatory Visit: Payer: Self-pay | Admitting: Gastroenterology

## 2019-03-25 DIAGNOSIS — R001 Bradycardia, unspecified: Secondary | ICD-10-CM | POA: Diagnosis not present

## 2019-03-25 DIAGNOSIS — Z8679 Personal history of other diseases of the circulatory system: Secondary | ICD-10-CM | POA: Diagnosis not present

## 2019-03-25 DIAGNOSIS — Z1211 Encounter for screening for malignant neoplasm of colon: Secondary | ICD-10-CM | POA: Diagnosis not present

## 2019-04-03 ENCOUNTER — Other Ambulatory Visit (HOSPITAL_COMMUNITY)
Admission: RE | Admit: 2019-04-03 | Discharge: 2019-04-03 | Disposition: A | Discharge status: Home / Self Care | Payer: Medicare Other | Source: Ambulatory Visit | Attending: Gastroenterology | Admitting: Gastroenterology

## 2019-04-03 DIAGNOSIS — Z01812 Encounter for preprocedural laboratory examination: Secondary | ICD-10-CM | POA: Insufficient documentation

## 2019-04-03 DIAGNOSIS — Z20822 Contact with and (suspected) exposure to covid-19: Secondary | ICD-10-CM | POA: Insufficient documentation

## 2019-04-03 LAB — SARS CORONAVIRUS 2 (TAT 6-24 HRS): SARS Coronavirus 2: NEGATIVE

## 2019-04-07 ENCOUNTER — Other Ambulatory Visit: Payer: Self-pay

## 2019-04-07 ENCOUNTER — Encounter (HOSPITAL_COMMUNITY): Payer: Self-pay | Admitting: Gastroenterology

## 2019-04-07 ENCOUNTER — Ambulatory Visit (HOSPITAL_COMMUNITY): Payer: Medicare Other | Admitting: Certified Registered Nurse Anesthetist

## 2019-04-07 ENCOUNTER — Encounter (HOSPITAL_COMMUNITY)
Admission: RE | Disposition: A | Discharge status: Home / Self Care | Payer: Self-pay | Source: Home / Self Care | Attending: Gastroenterology

## 2019-04-07 ENCOUNTER — Ambulatory Visit (HOSPITAL_COMMUNITY)
Admission: RE | Admit: 2019-04-07 | Discharge: 2019-04-07 | Disposition: A | Discharge status: Home / Self Care | Payer: Medicare Other | Attending: Gastroenterology | Admitting: Gastroenterology

## 2019-04-07 DIAGNOSIS — G4730 Sleep apnea, unspecified: Secondary | ICD-10-CM | POA: Diagnosis not present

## 2019-04-07 DIAGNOSIS — K648 Other hemorrhoids: Secondary | ICD-10-CM | POA: Diagnosis not present

## 2019-04-07 DIAGNOSIS — I2510 Atherosclerotic heart disease of native coronary artery without angina pectoris: Secondary | ICD-10-CM | POA: Diagnosis not present

## 2019-04-07 DIAGNOSIS — Z1211 Encounter for screening for malignant neoplasm of colon: Secondary | ICD-10-CM | POA: Diagnosis not present

## 2019-04-07 DIAGNOSIS — I110 Hypertensive heart disease with heart failure: Secondary | ICD-10-CM | POA: Insufficient documentation

## 2019-04-07 DIAGNOSIS — K5730 Diverticulosis of large intestine without perforation or abscess without bleeding: Secondary | ICD-10-CM | POA: Diagnosis not present

## 2019-04-07 DIAGNOSIS — K219 Gastro-esophageal reflux disease without esophagitis: Secondary | ICD-10-CM | POA: Diagnosis not present

## 2019-04-07 DIAGNOSIS — K6389 Other specified diseases of intestine: Secondary | ICD-10-CM | POA: Diagnosis not present

## 2019-04-07 DIAGNOSIS — I509 Heart failure, unspecified: Secondary | ICD-10-CM | POA: Diagnosis not present

## 2019-04-07 DIAGNOSIS — I5032 Chronic diastolic (congestive) heart failure: Secondary | ICD-10-CM | POA: Diagnosis not present

## 2019-04-07 DIAGNOSIS — I252 Old myocardial infarction: Secondary | ICD-10-CM | POA: Diagnosis not present

## 2019-04-07 DIAGNOSIS — I739 Peripheral vascular disease, unspecified: Secondary | ICD-10-CM | POA: Insufficient documentation

## 2019-04-07 DIAGNOSIS — D175 Benign lipomatous neoplasm of intra-abdominal organs: Secondary | ICD-10-CM | POA: Diagnosis not present

## 2019-04-07 HISTORY — PX: COLONOSCOPY WITH PROPOFOL: SHX5780

## 2019-04-07 HISTORY — PX: BIOPSY: SHX5522

## 2019-04-07 LAB — POCT I-STAT, CHEM 8
BUN: 11 mg/dL (ref 8–23)
Calcium, Ion: 1.13 mmol/L — ABNORMAL LOW (ref 1.15–1.40)
Chloride: 106 mmol/L (ref 98–111)
Creatinine, Ser: 0.9 mg/dL (ref 0.61–1.24)
Glucose, Bld: 180 mg/dL — ABNORMAL HIGH (ref 70–99)
HCT: 46 % (ref 39.0–52.0)
Hemoglobin: 15.6 g/dL (ref 13.0–17.0)
Potassium: 4 mmol/L (ref 3.5–5.1)
Sodium: 142 mmol/L (ref 135–145)
TCO2: 23 mmol/L (ref 22–32)

## 2019-04-07 LAB — GLUCOSE, CAPILLARY: Glucose-Capillary: 166 mg/dL — ABNORMAL HIGH (ref 70–99)

## 2019-04-07 SURGERY — COLONOSCOPY WITH PROPOFOL
Anesthesia: Monitor Anesthesia Care

## 2019-04-07 MED ORDER — LACTATED RINGERS IV SOLN
INTRAVENOUS | Status: DC
  Administered 2019-04-07: 09:00:00 1000 mL via INTRAVENOUS

## 2019-04-07 MED ORDER — PROPOFOL 10 MG/ML IV BOLUS
INTRAVENOUS | Status: DC | PRN
  Administered 2019-04-07 (×2): 20 mg via INTRAVENOUS

## 2019-04-07 MED ORDER — SODIUM CHLORIDE 0.9 % IV SOLN: INTRAVENOUS | Status: DC

## 2019-04-07 MED ORDER — EPHEDRINE SULFATE-NACL 50-0.9 MG/10ML-% IV SOSY
PREFILLED_SYRINGE | INTRAVENOUS | Status: DC | PRN
  Administered 2019-04-07: 5 mg via INTRAVENOUS

## 2019-04-07 MED ORDER — PROPOFOL 500 MG/50ML IV EMUL
INTRAVENOUS | Status: DC | PRN
  Administered 2019-04-07: 75 ug/kg/min via INTRAVENOUS

## 2019-04-07 MED ORDER — GLYCOPYRROLATE 0.2 MG/ML IJ SOLN
INTRAMUSCULAR | Status: DC | PRN
  Administered 2019-04-07: .2 mg via INTRAVENOUS

## 2019-04-07 MED ORDER — PROPOFOL 10 MG/ML IV BOLUS
INTRAVENOUS | Status: AC
  Filled 2019-04-07: qty 20

## 2019-04-07 SURGICAL SUPPLY — 21 items

## 2019-04-07 NOTE — Op Note (Signed)
[name] Patient Name: [name] Procedure Date: 04/07/2019 MRN: [id number] Attending MD: Otis Brace , MD Date of Birth: 07/26/1950 CSN: [id number] Age: 69 Admit Type: Outpatient Procedure:                Colonoscopy Indications:              Screening for colorectal malignant neoplasm, Last                            colonoscopy 10 years ago Providers:                Otis Brace, MD, Glori Bickers, RN, Lazaro Arms, Technician Referring MD:              Medicines:                Sedation Administered by an Anesthesia Professional Complications:            No immediate complications. Estimated Blood Loss:     Estimated blood loss was minimal. Procedure:                Pre-Anesthesia Assessment:                           - Prior to the procedure, a History and Physical                            was performed, and patient medications and                            allergies were reviewed. The patient's tolerance of                            previous anesthesia was also reviewed. The risks                            and benefits of the procedure and the sedation                            options and risks were discussed with the patient.                            All questions were answered, and informed consent                            was obtained. Prior Anticoagulants: The patient has                            taken no previous anticoagulant or antiplatelet                            agents. ASA Grade Assessment: III - A patient with  severe systemic disease. After reviewing the risks                            and benefits, the patient was deemed in                            satisfactory condition to undergo the procedure.                           After obtaining informed consent, the colonoscope                            was passed under direct vision. Throughout the                             procedure, the patient's blood pressure, pulse, and                            oxygen saturations were monitored continuously. The                            [id number] Olympus pediatric colposcope                            was introduced through the anus and advanced to the                            cecum, identified by appendiceal orifice and                            ileocecal valve. The colonoscopy was performed                            without difficulty. The patient tolerated the                            procedure well. The quality of the bowel                            preparation was fair. Scope In: 10:17:00 AM Scope Out: 10:39:23 AM Scope Withdrawal Time: 0 hours 17 minutes 6 seconds  Total Procedure Duration: 0 hours 22 minutes 23 seconds  Findings:      The perianal and digital rectal examinations were normal.      A localized area of mildly thickened folds of the mucosa was found in       the proximal ascending colon. Biopsies were taken with a cold forceps       for histology.      A moderate amount of liquid semi-liquid stool was found in the entire       colon, interfering with visualization. Lavage of the area was performed,       resulting in clearance with fair visualization.      There was a medium-sized lipoma, at the hepatic flexure.      A few diverticula were found in the sigmoid colon.  Internal hemorrhoids were found during retroflexion. The hemorrhoids       were large. Impression:               - Preparation of the colon was fair.                           - Thickened folds of the mucosa in the proximal                            ascending colon. Biopsied.                           - Stool in the entire examined colon.                           - Medium-sized lipoma at the hepatic flexure.                           - Diverticulosis in the sigmoid colon.                           - Internal hemorrhoids. Moderate Sedation:      Moderate  (conscious) sedation was personally administered by an       anesthesia professional. The following parameters were monitored: oxygen       saturation, heart rate, blood pressure, and response to care. Recommendation:           - Patient has a contact number available for                            emergencies. The signs and symptoms of potential                            delayed complications were discussed with the                            patient. Return to normal activities tomorrow.                            Written discharge instructions were provided to the                            patient.                           - Resume previous diet.                           - Continue present medications.                           - Await pathology results.                           - Repeat colonoscopy date to be determined after  pending pathology results are reviewed because the                            bowel preparation was suboptimal.                           - Return to my office PRN. Procedure Code(s):        --- Professional ---                           [phone], Colonoscopy, flexible; with biopsy, single                            or multiple Diagnosis Code(s):        --- Professional ---                           Z12.11, Encounter for screening for malignant                            neoplasm of colon                           K64.8, Other hemorrhoids                           K63.89, Other specified diseases of intestine                           K57.30, Diverticulosis of large intestine without                            perforation or abscess without bleeding CPT copyright 2019 American Medical Association. All rights reserved. The codes documented in this report are preliminary and upon coder review may  be revised to meet current compliance requirements. Otis Brace, MD Otis Brace, MD 04/07/2019 10:47:50 AM Number of Addenda:  0

## 2019-04-07 NOTE — Discharge Instructions (Signed)

## 2019-04-07 NOTE — Anesthesia Procedure Notes (Addendum)
Procedure Name: MAC Date/Time: 04/07/2019 10:11 AM Performed by: West Pugh, CRNA Pre-anesthesia Checklist: Patient identified, Emergency Drugs available, Suction available, Patient being monitored and Timeout performed Patient Re-evaluated:Patient Re-evaluated prior to induction Oxygen Delivery Method: Simple face mask Preoxygenation: Pre-oxygenation with 100% oxygen Induction Type: IV induction Placement Confirmation: positive ETCO2 Dental Injury: Teeth and Oropharynx as per pre-operative assessment

## 2019-04-07 NOTE — Transfer of Care (Signed)
Immediate Anesthesia Transfer of Care Note  Patient: [name]  Procedure(s) Performed: COLONOSCOPY WITH PROPOFOL (N/A ) BIOPSY  Patient Location: PACU  Anesthesia Type:MAC  Level of Consciousness: awake, alert  and patient cooperative  Airway & Oxygen Therapy: Patient Spontanous Breathing and Patient connected to face mask oxygen  Post-op Assessment: Report given to RN and Post -op Vital signs reviewed and stable  Post vital signs: Reviewed and stable  Last Vitals:  Vitals Value Taken Time  BP 127/73 04/07/19 1046  Temp    Pulse 91 04/07/19 1046  Resp 21 04/07/19 1046  SpO2 100 % 04/07/19 1046  Vitals shown include unvalidated device data.  Last Pain:  Vitals:   04/07/19 1046  TempSrc:   PainSc: 0-No pain         Complications: No apparent anesthesia complications

## 2019-04-07 NOTE — H&P (Signed)
[name] is a 69 y.o. male has presented today for colon cancer screening.   The various methods of treatment have been discussed with the patient and family. After consideration of risks, benefits and other options for treatment, the patient has consented to  Procedure(s): Colonoscopy  as a surgical intervention .  The patient's history has been reviewed, patient examined, no change in status, stable for surgery.  I have reviewed the patient's chart and labs.  Questions were answered to the patient's satisfaction.   Risks (bleeding, infection, bowel perforation that could require surgery, sedation-related changes in cardiopulmonary systems), benefits (identification and possible treatment of source of symptoms, exclusion of certain causes of symptoms), and alternatives (watchful waiting, radiographic imaging studies, empiric medical treatment)  were explained to patient in detail and patient wishes to proceed.  Otis Brace MD, [place] 04/07/2019, 10:05 AM  Contact #  [phone]

## 2019-04-07 NOTE — Anesthesia Preprocedure Evaluation (Addendum)
Anesthesia Evaluation  Patient identified by MRN, date of birth, ID band Patient awake    Reviewed: Allergy & Precautions  Airway Mallampati: II  TM Distance: >3 FB     Dental   Pulmonary sleep apnea ,    breath sounds clear to auscultation       Cardiovascular hypertension, + CAD, + Past MI, + Peripheral Vascular Disease and +CHF  + dysrhythmias + Valvular Problems/Murmurs  Rhythm:Regular Rate:Normal     Neuro/Psych    GI/Hepatic Neg liver ROS, GERD  ,  Endo/Other  diabetesHypothyroidism   Renal/GU negative Renal ROS     Musculoskeletal   Abdominal   Peds  Hematology   Anesthesia Other Findings   Reproductive/Obstetrics                            Anesthesia Physical Anesthesia Plan  ASA: III  Anesthesia Plan: MAC   Post-op Pain Management:    Induction: Intravenous  PONV Risk Score and Plan: 1 and Ondansetron and Midazolam  Airway Management Planned: Nasal Cannula and Simple Face Mask  Additional Equipment:   Intra-op Plan:   Post-operative Plan:   Informed Consent: I have reviewed the patients History and Physical, chart, labs and discussed the procedure including the risks, benefits and alternatives for the proposed anesthesia with the patient or authorized representative who has indicated his/her understanding and acceptance.     Dental advisory given  Plan Discussed with: CRNA and Anesthesiologist  Anesthesia Plan Comments:         Anesthesia Quick Evaluation

## 2019-04-07 NOTE — Anesthesia Postprocedure Evaluation (Signed)
Anesthesia Post Note  Patient: [name]  Procedure(s) Performed: COLONOSCOPY WITH PROPOFOL (N/A ) BIOPSY     Patient location during evaluation: Endoscopy Anesthesia Type: MAC Level of consciousness: awake Pain management: pain level controlled Vital Signs Assessment: post-procedure vital signs reviewed and stable Respiratory status: spontaneous breathing Cardiovascular status: stable Postop Assessment: no apparent nausea or vomiting Anesthetic complications: no    Last Vitals:  Vitals:   04/07/19 1050 04/07/19 1100  BP: (!) 116/100 136/87  Pulse: 94 93  Resp: 19 17  Temp:    SpO2: 100% 99%    Last Pain:  Vitals:   04/07/19 1046  TempSrc: Oral  PainSc: 0-No pain                 Druscilla Petsch

## 2019-04-08 ENCOUNTER — Encounter: Payer: Self-pay | Admitting: *Deleted

## 2019-04-08 LAB — SURGICAL PATHOLOGY

## 2019-04-21 DIAGNOSIS — E039 Hypothyroidism, unspecified: Secondary | ICD-10-CM | POA: Diagnosis not present

## 2019-04-21 DIAGNOSIS — M65331 Trigger finger, right middle finger: Secondary | ICD-10-CM | POA: Diagnosis not present

## 2019-04-30 NOTE — Addendum Note (Signed)
Addended by: Jerline Pain on: 04/30/2019 06:39 AM   Modules accepted: Orders

## 2019-05-13 ENCOUNTER — Other Ambulatory Visit: Payer: Self-pay

## 2019-05-13 ENCOUNTER — Ambulatory Visit (INDEPENDENT_AMBULATORY_CARE_PROVIDER_SITE_OTHER): Payer: Medicare Other

## 2019-05-13 ENCOUNTER — Ambulatory Visit (INDEPENDENT_AMBULATORY_CARE_PROVIDER_SITE_OTHER): Payer: Medicare Other | Admitting: Orthopaedic Surgery

## 2019-05-13 ENCOUNTER — Encounter: Payer: Self-pay | Admitting: Orthopaedic Surgery

## 2019-05-13 DIAGNOSIS — M7062 Trochanteric bursitis, left hip: Secondary | ICD-10-CM

## 2019-05-13 MED ORDER — METHYLPREDNISOLONE ACETATE 40 MG/ML IJ SUSP
40.0000 mg | INTRAMUSCULAR | Status: AC | PRN
  Administered 2019-05-13: 40 mg via INTRA_ARTICULAR

## 2019-05-13 MED ORDER — LIDOCAINE HCL 1 % IJ SOLN
3.0000 mL | INTRAMUSCULAR | Status: AC | PRN
  Administered 2019-05-13: 3 mL

## 2019-05-13 NOTE — Progress Notes (Signed)
Office Visit Note   Patient: [name]           Date of Birth: 15-Aug-1950           MRN: [id number] Visit Date: 05/13/2019              Requested by: [name], MD [place] [employer] [address],  [place] [address] PCP: [name], MD   Assessment & Plan: Visit Diagnoses:  1. Trochanteric bursitis of left hip     Plan: Discussed with patient IT band stretching exercises.  If his pain progresses or does not dissipate he will follow-up with [place].  Questions encouraged and answered.  He is to watch his glucose levels over the next few days.  Questions were encouraged and answered at length.  Patient's clinical findings and radiographs were reviewed with Dr. [name] today.  Follow-Up Instructions: Return if symptoms worsen or fail to improve.   Orders:  Orders Placed This Encounter  Procedures  . Large Joint Inj  . XR HIP UNILAT W OR W/O PELVIS 2-3 VIEWS LEFT   No orders of the defined types were placed in this encounter.     Procedures: Large Joint Inj: L greater trochanter on 05/13/2019 9:15 AM Indications: pain Details: 22 G 1.5 in needle, lateral approach  Arthrogram: No  Medications: 3 mL lidocaine 1 %; 40 mg methylPREDNISolone acetate 40 MG/ML Outcome: tolerated well, no immediate complications Procedure, treatment alternatives, risks and benefits explained, specific risks discussed. Consent was given by the patient. Immediately prior to procedure a time out was called to verify the correct patient, procedure, equipment, support staff and site/side marked as required. Patient was prepped and draped in the usual sterile fashion.       Clinical Data: No additional findings.   Subjective: Chief Complaint  Patient presents with  . Left Hip - Pain    HPI [name] is a pleasant 69 year old male well-known to our department service comes in today with left hip pain is been ongoing for the past month.  Denies any radicular symptoms down leg.  Pain sharp  groin area in the lateral aspect of the hip.  Worse with ambulation.  He states also he lies on the hip at night that he has pain.  He has had no injury.  Does have BKA on the right side.  He denies any right hip pain.  He is diabetic with a most recent hemoglobin A1c of 6.3.  His second Covid vaccine was March 1.  Review of Systems  Constitutional: Negative for chills and fever.  Cardiovascular: Negative for chest pain.  Musculoskeletal: Positive for arthralgias.     Objective: Vital Signs: There were no vitals taken for this visit.  Physical Exam Constitutional:      Appearance: He is not ill-appearing or diaphoretic.  Pulmonary:     Effort: Pulmonary effort is normal.  Neurological:     Mental Status: He is alert and oriented to person, place, and time.  Psychiatric:        Mood and Affect: Mood normal.     Ortho Exam Bilateral hips excellent range of motion he does have discomfort with internal and external rotation of left hip.  Tenderness over the left trochanteric region.  No tenderness over the right trochanteric region. Specialty Comments:  No specialty comments available.  Imaging: XR HIP UNILAT W OR W/O PELVIS 2-3 VIEWS LEFT  Result Date: 05/13/2019 AP pelvis lateral view left hip: No acute fractures.  Both hips  well located.  Metallic objects most likely stent placements is seen over the pelvic and hip region on the left side.  Hip joints well maintained.    PMFS History: Patient Active Problem List   Diagnosis Date Noted  . S/P transmetatarsal amputation of foot, left ([place]) 04/25/2016  . Chronic osteomyelitis of toe, left ([place])   . Cellulitis of toe of left foot   . Chest pain 09/25/2015  . CAD (coronary artery disease) of artery bypass graft 09/25/2015  . Sinus bradycardia on ECG 09/25/2015  . Chronic diastolic CHF (congestive heart failure) ([place]) 09/25/2015  . Chest pain at rest 09/25/2015  . Aortic valvular disorder 08/04/2014  . Disorder of mitral  valve 08/04/2014  . Diabetic foot infection ([place]) 06/03/2014  . Peripheral vascular disease ([place]) 06/03/2014  . Abscess of foot 06/03/2014  . CAD (coronary artery disease) 07/29/2013  . Trifascicular block 07/29/2013  . 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 Hypothyroidism 01/22/2007  . Type 2 diabetes mellitus with vascular disease ([place]) 01/22/2007  . HYPERLIPIDEMIA, MIXED 01/22/2007  . MYOCARDIAL INFARCTION, HX OF 01/22/2007  . DEGENERATIVE JOINT DISEASE 01/22/2007  . ANEMIA, IRON DEFICIENCY, HX OF 01/22/2007   Past Medical History:  Diagnosis Date  . Coronary artery disease    a. remote stenting to ramus, prox LAD x2.  . Dental crowns present   . Diabetes mellitus    diet-controlled  . First degree AV block   . GERD (gastroesophageal reflux disease)   . Heart attack ([place]) 06/2004  . Heart murmur    aortic  . Hx MRSA infection 02/2006  . Hyperlipidemia   . Hypertension    hx. of - has not been on med. since losing wt. after gastric bypass  . Hypothyroidism   . Morbid obesity ([place])   . Mucoid cyst of joint 09/2011   left thumb  . Sleep apnea    sleep study 03/29/2011; no CPAP use  . Trifascicular block     Family History  Problem Relation Age of Onset  . Heart disease Mother     Past Surgical History:  Procedure Laterality Date  . AMPUTATION Left 04/19/2016   Procedure: Left Foot 4th Toe Amputation vs. Transmetatarsal;  Surgeon: [name], MD;  Location: [facility];  Service: Orthopedics;  Laterality: Left;  . BIOPSY  04/07/2019   Procedure: BIOPSY;  Surgeon: Otis Brace, MD;  Location: [facility];  Service: Gastroenterology;;  . CATARACT EXTRACTION  02/2010; 03/2010  . COLONOSCOPY WITH PROPOFOL N/A 09/14/2014   Procedure: COLONOSCOPY WITH PROPOFOL;  Surgeon: Garlan Fair, MD;  Location: [facility];  Service: Endoscopy;  Laterality: N/A;  . COLONOSCOPY WITH PROPOFOL N/A 04/07/2019   Procedure: COLONOSCOPY WITH PROPOFOL;  Surgeon: Otis Brace,  MD;  Location: [facility];  Service: Gastroenterology;  Laterality: N/A;  . CORONARY ANGIOPLASTY WITH STENT PLACEMENT  07/12/2004; 08/03/2004   total of 3 stents  . I & D EXTREMITY Right 06/04/2014   Procedure: IRRIGATION AND DEBRIDEMENT EXTREMITY;  Surgeon: [name], MD;  Location: [facility];  Service: Orthopedics;  Laterality: Right;  . MASS EXCISION  10/04/2011   Procedure: EXCISION MASS;  Surgeon: Wynonia Sours, MD;  Location: [facility];  Service: Orthopedics;  Laterality: Left;  excision cyst debridment ip joint of left thumb  . PROSTATECTOMY    . ROUX-EN-Y GASTRIC BYPASS  10/10/2010   laparoscopic  . TOE AMPUTATION  02/23/2006   left foot second ray amputation  Social History   Occupational History  . Occupation: Engineer, maintenance (IT) taxes  Tobacco Use  . Smoking status: Never Smoker  . Smokeless tobacco: Never Used  Substance and Sexual Activity  . Alcohol use: No  . Drug use: No  . Sexual activity: Not on file

## 2019-05-23 ENCOUNTER — Encounter: Payer: Self-pay | Admitting: Orthopaedic Surgery

## 2019-06-19 DIAGNOSIS — S8011XA Contusion of right lower leg, initial encounter: Secondary | ICD-10-CM | POA: Diagnosis not present

## 2019-06-19 DIAGNOSIS — E039 Hypothyroidism, unspecified: Secondary | ICD-10-CM | POA: Diagnosis not present

## 2019-07-01 DIAGNOSIS — T8789 Other complications of amputation stump: Secondary | ICD-10-CM | POA: Diagnosis not present

## 2019-07-01 DIAGNOSIS — Z89511 Acquired absence of right leg below knee: Secondary | ICD-10-CM | POA: Diagnosis not present

## 2019-08-27 ENCOUNTER — Telehealth: Payer: Self-pay | Admitting: *Deleted

## 2019-08-27 DIAGNOSIS — M65331 Trigger finger, right middle finger: Secondary | ICD-10-CM | POA: Diagnosis not present

## 2019-08-27 NOTE — Telephone Encounter (Signed)
   [facility] Pre-operative Risk Assessment    HEARTCARE STAFF: - Please ensure there is not already an duplicate clearance open for this procedure. - Under Visit Info/Reason for Call, type in Other and utilize the format Clearance MM/DD/YY or Clearance TBD. Do not use dashes or single digits. - If request is for dental extraction, please clarify the # of teeth to be extracted.  Request for surgical clearance:  1. What type of surgery is being performed? RELEASE A-1 PULLEY RIGHT MIDDLE FINGER   2. When is this surgery scheduled? 09/16/19   3. What type of clearance is required (medical clearance vs. Pharmacy clearance to hold med vs. Both)? MEDICAL  4. Are there any medications that need to be held prior to surgery and how long? ASA    5. Practice name and name of physician performing surgery? [facility]; DR. [place]   6. What is the office phone number? [phone]   7.   What is the office fax number? [phone] ATTN: ALICIA  8.   Anesthesia type (None, local, MAC, general) ? IV REGIONAL FOREARM BLOCK   Julaine Hua 08/27/2019, 3:40 PM  _________________________________________________________________   (provider comments below)

## 2019-08-28 ENCOUNTER — Other Ambulatory Visit: Payer: Self-pay | Admitting: Orthopedic Surgery

## 2019-08-28 NOTE — Telephone Encounter (Signed)
Primary Cardiologist:Mark Marlou Porch, MD  Chart reviewed as part of pre-operative protocol coverage. Because of [name]'s past medical history and time since last visit, he/she will require a follow-up visit in order to better assess preoperative cardiovascular risk.  Pre-op covering staff: - Please schedule appointment and call patient to inform them. - Please contact requesting surgeon's office via preferred method (i.e, phone, fax) to inform them of need for appointment prior to surgery.  If applicable, this message will also be routed to pharmacy pool and/or primary cardiologist for input on holding anticoagulant/antiplatelet agent as requested below so that this information is available at time of patient's appointment.   Deberah Pelton, NP  08/28/2019, 8:49 AM

## 2019-08-28 NOTE — Telephone Encounter (Signed)
1st attempt to reach pt regarding surgical clearance and the need for an appointment before clearance can be addressed.  Left a detailed message for pt to call the office to schedule.

## 2019-08-29 NOTE — Telephone Encounter (Signed)
Attempted to reach patient to schedule appointment for clearance. Patient answered but call was disconnected. I attempted to call patient back but no answer.

## 2019-09-01 NOTE — Telephone Encounter (Signed)
3rd attempt to reach pt regarding needing an appointment for surgical clearance.  Left detailed message for pt to call and schedule.   I will roue back to the requesting surgeon's office to make them aware that we cannot reach pt and maybe they can have pt call.

## 2019-09-11 ENCOUNTER — Telehealth: Payer: Self-pay | Admitting: Cardiology

## 2019-09-11 NOTE — Telephone Encounter (Signed)
Will forward to pre op call back as we have been trying to reach pt about pre op request.

## 2019-09-11 NOTE — Telephone Encounter (Signed)
Patient called to schedule appointment for medical clearance. I offered 09/16/19 with Dr. Marlou Porch. Patient refused appointment and insisted that he must be seen sooner as his procedure is scheduled for 09/16/19 at 8:30 AM. Made patient aware I am forwarding his request to our pre-op team.

## 2019-09-11 NOTE — Telephone Encounter (Signed)
Message send to scheduling pool to schedule pre op appointment

## 2019-09-11 NOTE — Telephone Encounter (Signed)
New message:     Patient calling stating that some one states they have called. Patient has been out of town. Please call patient [phone]

## 2019-09-11 NOTE — Telephone Encounter (Signed)
Patient is scheduled to Lucas Deforest, PA-C tomorrow 09/12/19 at 12:15 PM patient's COVID test was pushed back to later in the day of 09/12/19. Patient thanked me for calling and verbalized an understanding.

## 2019-09-11 NOTE — Telephone Encounter (Signed)
Patient is scheduled to [name], PA-C tomorrow 09/12/19 at 12:15 PM patient's COVID test was pushed back to later in the day of 09/12/19. Patient thanked me for calling and verbalized an understanding.

## 2019-09-12 ENCOUNTER — Other Ambulatory Visit (HOSPITAL_COMMUNITY): Payer: Medicare Other

## 2019-09-12 ENCOUNTER — Other Ambulatory Visit (HOSPITAL_COMMUNITY)
Admission: RE | Admit: 2019-09-12 | Discharge: 2019-09-12 | Disposition: A | Discharge status: Home / Self Care | Payer: Medicare Other | Source: Ambulatory Visit | Attending: Orthopedic Surgery | Admitting: Orthopedic Surgery

## 2019-09-12 ENCOUNTER — Other Ambulatory Visit: Payer: Self-pay

## 2019-09-12 ENCOUNTER — Encounter: Payer: Self-pay | Admitting: Physician Assistant

## 2019-09-12 ENCOUNTER — Encounter (HOSPITAL_BASED_OUTPATIENT_CLINIC_OR_DEPARTMENT_OTHER): Payer: Self-pay | Admitting: Orthopedic Surgery

## 2019-09-12 ENCOUNTER — Ambulatory Visit (INDEPENDENT_AMBULATORY_CARE_PROVIDER_SITE_OTHER): Payer: Medicare Other | Admitting: Physician Assistant

## 2019-09-12 VITALS — BP 130/72 | HR 54 | Ht >= 80 in | Wt 243.2 lb

## 2019-09-12 DIAGNOSIS — Z01810 Encounter for preprocedural cardiovascular examination: Secondary | ICD-10-CM | POA: Diagnosis not present

## 2019-09-12 DIAGNOSIS — I452 Bifascicular block: Secondary | ICD-10-CM

## 2019-09-12 DIAGNOSIS — Z20822 Contact with and (suspected) exposure to covid-19: Secondary | ICD-10-CM | POA: Diagnosis not present

## 2019-09-12 DIAGNOSIS — E039 Hypothyroidism, unspecified: Secondary | ICD-10-CM

## 2019-09-12 DIAGNOSIS — E119 Type 2 diabetes mellitus without complications: Secondary | ICD-10-CM

## 2019-09-12 DIAGNOSIS — I2510 Atherosclerotic heart disease of native coronary artery without angina pectoris: Secondary | ICD-10-CM | POA: Diagnosis not present

## 2019-09-12 DIAGNOSIS — Z01812 Encounter for preprocedural laboratory examination: Secondary | ICD-10-CM | POA: Insufficient documentation

## 2019-09-12 DIAGNOSIS — I10 Essential (primary) hypertension: Secondary | ICD-10-CM | POA: Diagnosis not present

## 2019-09-12 DIAGNOSIS — E785 Hyperlipidemia, unspecified: Secondary | ICD-10-CM | POA: Diagnosis not present

## 2019-09-12 LAB — SARS CORONAVIRUS 2 (TAT 6-24 HRS): SARS Coronavirus 2: NEGATIVE

## 2019-09-12 NOTE — Progress Notes (Signed)
   Primary Cardiologist: Candee Furbish, MD  [name] (DOB Oct 05, 1950) presented to cardiology office today for preoperative clearance. He denies any recent chest pain or significant dyspnea. Last myoview in 2017 was normal. Last echo obtained in 2018 showed EF 45-50%. EKG is unchanged with RBBB and left anterior fascicular block. He has a history of bradycardia with anesthesia, however upcoming surgery can be done with regional block instead of full anesthesia. He is cleared to proceed with the low risk hand surgery from cardiac perspective without further workup. If at all possible, we would prefer [name] to proceed with surgery on aspirin given his prior cardiac history, however if absolutely needed, he may hold aspirin prior to the surgery and restart as soon as possible afterward.   [place], [place] 09/12/2019, 12:39 PM

## 2019-09-12 NOTE — Progress Notes (Signed)
Cardiology Office Note:    Date:  09/14/2019   ID:  [name], DOB November 06, 1950, MRN [id number]  PCP:  [name], MD  [facility] Cardiologist:  Candee Furbish, MD  [facility] Electrophysiologist: None   Referring MD: [name], MD   Chief Complaint  Patient presents with   Follow-up    seen for Dr. Marlou Porch    History of Present Illness:    [name] is a 69 y.o. male with a hx of CAD, HTN, HLD, DM 2, hypothyroidism and obstructive sleep apnea.  Patient had a prior stent to ramus intermedius in 2006 and 2 stents to proximal LAD.  He has diffuse disease in the distal RCA.  Previous Myoview in 2012 showed fixed inferior defect.  He underwent gastric bypass surgery in September 2012 and had significant weight loss afterward.  He has a history of first-degree AV block, bifascicular block and sinus bradycardia.  However he has no high risk symptoms such as syncope or angina.  During previous surgery, his heart rate was in the 30s at times.  Last Myoview obtained on 09/26/2015 showed EF 46%, no reversible ischemia or infarction.  Last echocardiogram obtained on 04/11/2016 showed EF 45 to 50%, inferior hypokinesis, grade 1 DD, mild aortic stenosis and aortic regurgitation, dilated aortic root measuring at 42 mm, mild MR.  Patient presents today for preoperative clearance.  He denies any recent chest pain or shortness of breath.  His EKG is unchanged and still shows right bundle branch block with left anterior fascicular block.  Upcoming A1 pulley release surgery is considered very low risk surgery.  This will be done under regional block instead of general anesthesia which has lysed likelihood of causing bradycardia during the surgery.  Overall, he is quite stable from cardiac perspective to proceed.  Past Medical History:  Diagnosis Date   Coronary artery disease    a. remote stenting to ramus, prox LAD x2.   Dental crowns present    Diabetes mellitus    First degree AV block     GERD (gastroesophageal reflux disease)    Heart attack ([place]) 06/2004   Heart murmur    aortic   Hx MRSA infection 02/2006   Hyperlipidemia    Hypertension    hx. of - has not been on med. since losing wt. after gastric bypass   Hypothyroidism    Morbid obesity ([place])    Mucoid cyst of joint 09/2011   left thumb   Sleep apnea    sleep study 03/29/2011; no CPAP use, lost >130lbs   Trifascicular block     Past Surgical History:  Procedure Laterality Date   AMPUTATION Left 04/19/2016   Procedure: Left Foot 4th Toe Amputation vs. Transmetatarsal;  Surgeon: [name], MD;  Location: [place];  Service: Orthopedics;  Laterality: Left;   BIOPSY  04/07/2019   Procedure: BIOPSY;  Surgeon: Otis Brace, MD;  [facility];  Service: Gastroenterology;;   CATARACT EXTRACTION  02/2010; 03/2010   COLONOSCOPY WITH PROPOFOL N/A 09/14/2014   Procedure: COLONOSCOPY WITH PROPOFOL;  Surgeon: Garlan Fair, MD;  [facility];  Service: Endoscopy;  Laterality: N/A;   COLONOSCOPY WITH PROPOFOL N/A 04/07/2019   Procedure: COLONOSCOPY WITH PROPOFOL;  Surgeon: Otis Brace, MD;  [facility];  Service: Gastroenterology;  Laterality: N/A;   CORONARY ANGIOPLASTY WITH STENT PLACEMENT  07/12/2004; 08/03/2004   total of 3 stents   I & D EXTREMITY Right 06/04/2014   Procedure: IRRIGATION AND DEBRIDEMENT EXTREMITY;  Surgeon: [name], MD;  Location: [place];  Service: Orthopedics;  Laterality: Right;   MASS EXCISION  10/04/2011   Procedure: EXCISION MASS;  Surgeon: Wynonia Sours, MD;  Location: [place];  Service: Orthopedics;  Laterality: Left;  excision cyst debridment ip joint of left thumb   PROSTATECTOMY     ROUX-EN-Y GASTRIC BYPASS  10/10/2010   laparoscopic   TOE AMPUTATION  02/23/2006   left foot second ray amputation    Current Medications: Current Meds  Medication Sig   aspirin EC 81 MG tablet Take 81 mg by mouth daily.    atorvastatin (LIPITOR) 10 MG tablet Take 10 mg by mouth at bedtime.    CALCIUM CITRATE PO Take 500 mg by mouth 3 (three) times daily.   Cyanocobalamin (B-12) 500 MCG SUBL Place 500 mcg under the tongue every Monday, Wednesday, and Friday.    ferrous sulfate 325 (65 FE) MG tablet Take 325 mg by mouth daily.    fluticasone (FLONASE) 50 MCG/ACT nasal spray Place 2 sprays into both nostrils daily as needed for allergies.    ibuprofen (ADVIL,MOTRIN) 200 MG tablet Take 400 mg by mouth every 6 (six) hours as needed for headache (pain).   levothyroxine (SYNTHROID) 150 MCG tablet Take 150 mcg by mouth daily before breakfast.   lisinopril (ZESTRIL) 5 MG tablet TAKE 1 TABLET BY MOUTH EVERY DAY (Patient taking differently: Take 5 mg by mouth daily. )   Melatonin 10 MG TABS Take 10 mg/L by mouth at bedtime.   metFORMIN (GLUCOPHAGE-XR) 500 MG 24 hr tablet Take 500 mg by mouth 2 (two) times daily.   Multiple Vitamin (MULTIVITAMIN WITH MINERALS) TABS tablet Take 1 tablet by mouth 2 (two) times daily.    Multiple Vitamins-Minerals (PRESERVISION AREDS 2) CAPS Take 1 capsule by mouth 2 (two) times daily.   nitroGLYCERIN (NITROSTAT) 0.4 MG SL tablet Place 1 tablet (0.4 mg total) under the tongue every 5 (five) minutes x 3 doses as needed for chest pain.   Omega-3 Fatty Acids (FISH OIL TRIPLE STRENGTH PO) Take 1 capsule by mouth daily. Chewable   Omeprazole 20 MG TBEC Take 20 mg by mouth daily as needed (acid reflux/ indigestion).    OVER THE COUNTER MEDICATION Take 1 tablet by mouth 2 (two) times daily. [facility] Complex: glucosamine/cat's claw extract   sildenafil (REVATIO) 20 MG tablet Take 40-100 mg by mouth daily as needed (Erectile Dysfunction).      Allergies:   Bactrim [sulfamethoxazole-trimethoprim], Moxifloxacin, Penicillins, and Quinolones   Social History   Socioeconomic History   Marital status: Married    Spouse name: Not on file   Number of children: Not on file    Years of education: Not on file   Highest education level: Not on file  Occupational History   Occupation: Engineer, maintenance (IT) taxes  Tobacco Use   Smoking status: Never Smoker   Smokeless tobacco: Never Used  Substance and Sexual Activity   Alcohol use: No   Drug use: No   Sexual activity: Yes  Other Topics Concern   Not on file  Social History Narrative   Parents and sister all using CPAP   Social Determinants of Health   Financial Resource Strain:    Difficulty of Paying Living Expenses: Not on file  Food Insecurity:    Worried About Charity fundraiser in the Last Year: Not on file   [employer] of Food in the Last Year: Not on file  Transportation Needs:  Lack of Transportation (Medical): Not on file   Lack of Transportation (Non-Medical): Not on file  Physical Activity:    Days of Exercise per Week: Not on file   Minutes of Exercise per Session: Not on file  Stress:    Feeling of Stress : Not on file  Social Connections:    Frequency of Communication with Friends and Family: Not on file   Frequency of Social Gatherings with Friends and Family: Not on file   Attends Religious Services: Not on file   Active Member of Clubs or Organizations: Not on file   Attends Archivist Meetings: Not on file   Marital Status: Not on file     Family History: The patient's family history includes Heart disease in his mother.  ROS:   Please see the history of present illness.     All other systems reviewed and are negative.  EKGs/Labs/Other Studies Reviewed:    The following studies were reviewed today:  Myoview 09/26/2015 IMPRESSION: 1. No reversible ischemia or infarction.  2. Mild global hypokinesis.  No focal wall motion abnormality.  3. Left ventricular ejection fraction 46%  4. Non invasive risk stratification*: Intermediate, due to low ejection fraction.    Echo 04/19/2016 Study Conclusions   - Left ventricle: The cavity size was normal.  Wall thickness was  increased in a pattern of moderate LVH. Systolic function was  mildly reduced. The estimated ejection fraction was in the range  of 45% to 50%. Inferior hypokinesis. Doppler parameters are  consistent with abnormal left ventricular relaxation (grade 1  diastolic dysfunction). The E/e&' ratio is between 8-15,  suggesting indeterminate LV filling pressure.  - Aortic valve: Trileaflet; mildly calcified leaflets. Mild  stenosis. There was mild regurgitation. Mean gradient (S): 14 mm  Hg. Peak gradient (S): 27 mm Hg.  - Aorta: Aortic root dimension: 42 mm (ED).  - Aortic root: The aortic root is mildly dilated.  - Mitral valve: Mildly thickened leaflets . There was mild  regurgitation.  - Left atrium: The atrium was normal in size.  - Right ventricle: Systolic function is mildly reduced.  - Right atrium: The atrium was normal in size.  - Inferior vena cava: The vessel was normal in size. The  respirophasic diameter changes were in the normal range (>= 50%),  consistent with normal central venous pressure.   Impressions:   - Compared to a prior study in 2017, the LVEF is slightly worse at  45-50%. There is a suggestion of inferior wall hypokinesis.   EKG:  EKG is ordered today.  The ekg ordered today demonstrates sinus rhythm, first-degree AV block, right bundle branch block, left anterior fascicular block.  Recent Labs: 04/07/2019: BUN 11; Creatinine, Ser 0.90; Hemoglobin 15.6; Potassium 4.0; Sodium 142  Recent Lipid Panel No results found for: CHOL, TRIG, HDL, CHOLHDL, VLDL, LDLCALC, LDLDIRECT  Physical Exam:    VS:  BP 130/72    Pulse (!) 54    Ht 6\' 8"  (2.032 m)    Wt 243 lb 3.2 oz (110.3 kg)    BMI 26.72 kg/m     Wt Readings from Last 3 Encounters:  09/12/19 243 lb 3.2 oz (110.3 kg)  04/07/19 240 lb (108.9 kg)  12/16/18 238 lb (108 kg)     GEN:  Well nourished, well developed in no acute distress HEENT: Normal NECK: No JVD; No  carotid bruits LYMPHATICS: No lymphadenopathy CARDIAC: RRR, no murmurs, rubs, gallops RESPIRATORY:  Clear to auscultation without rales, wheezing  or rhonchi  ABDOMEN: Soft, non-tender, non-distended MUSCULOSKELETAL:  No edema; No deformity  SKIN: Warm and dry NEUROLOGIC:  Alert and oriented x 3 PSYCHIATRIC:  Normal affect   ASSESSMENT:    1. Preop cardiovascular exam   2. Coronary artery disease involving native coronary artery of native heart without angina pectoris   3. Essential hypertension   4. Hyperlipidemia LDL goal <70   5. Controlled type 2 diabetes mellitus without complication, without long-term current use of insulin ([place])   6. Hypothyroidism, unspecified type   7. Bifascicular block    PLAN:    In order of problems listed above:  1. Preoperative clearance: Upcoming hand surgery, this is a low risk surgery and patient denies any recent chest pain.  He may proceed from cardiac perspective without further work-up.  2. CAD: Denies any recent chest pain.  On aspirin and Lipitor  3. Hypertension: Blood pressure stable  4. Hyperlipidemia: Continue Lipitor  5. DM2: Managed by primary care provider  6. Hypothyroidism: Managed by primary care provider  7. Bifascicular block: No syncope to suggest worsening conduction disease   Medication Adjustments/Labs and Tests Ordered: Current medicines are reviewed at length with the patient today.  Concerns regarding medicines are outlined above.  No orders of the defined types were placed in this encounter.  No orders of the defined types were placed in this encounter.   Patient Instructions  Medication Instructions:  Continue same medications *If you need a refill on your cardiac medications before your next appointment, please call your pharmacy*   Lab Work: None ordered   Testing/Procedures: None ordered   Follow-Up: At [facility], you and your health needs are our priority.  As part of our continuing  mission to provide you with exceptional heart care, we have created designated Provider Care Teams.  These Care Teams include your primary Cardiologist (physician) and Advanced Practice Providers (APPs -  Physician Assistants and Nurse Practitioners) who all work together to provide you with the care you need, when you need it.  We recommend signing up for the patient portal called "MyChart".  Sign up information is provided on this After Visit Summary.  MyChart is used to connect with patients for Virtual Visits (Telemedicine).  Patients are able to view lab/test results, encounter notes, upcoming appointments, etc.  Non-urgent messages can be sent to your provider as well.   To learn more about what you can do with MyChart, go to [email or web address].    Your next appointment:  Nov or Dec   The format for your next appointment: Office     Provider:  Dr.Skains      Hilbert Corrigan, [place]  09/14/2019 11:08 PM    [place]

## 2019-09-12 NOTE — Patient Instructions (Signed)
Medication Instructions:  Continue same medications *If you need a refill on your cardiac medications before your next appointment, please call your pharmacy*   Lab Work: None ordered   Testing/Procedures: None ordered   Follow-Up: At [facility], you and your health needs are our priority.  As part of our continuing mission to provide you with exceptional heart care, we have created designated Provider Care Teams.  These Care Teams include your primary Cardiologist (physician) and Advanced Practice Providers (APPs -  Physician Assistants and Nurse Practitioners) who all work together to provide you with the care you need, when you need it.  We recommend signing up for the patient portal called "MyChart".  Sign up information is provided on this After Visit Summary.  MyChart is used to connect with patients for Virtual Visits (Telemedicine).  Patients are able to view lab/test results, encounter notes, upcoming appointments, etc.  Non-urgent messages can be sent to your provider as well.   To learn more about what you can do with MyChart, go to [email or web address].    Your next appointment:  Nov or Dec   The format for your next appointment: Office     Provider:  Dr.Skains

## 2019-09-14 ENCOUNTER — Encounter: Payer: Self-pay | Admitting: Physician Assistant

## 2019-09-16 ENCOUNTER — Encounter (HOSPITAL_BASED_OUTPATIENT_CLINIC_OR_DEPARTMENT_OTHER): Payer: Self-pay | Admitting: Orthopedic Surgery

## 2019-09-16 ENCOUNTER — Ambulatory Visit (HOSPITAL_BASED_OUTPATIENT_CLINIC_OR_DEPARTMENT_OTHER): Payer: Medicare Other | Admitting: Anesthesiology

## 2019-09-16 ENCOUNTER — Encounter (HOSPITAL_BASED_OUTPATIENT_CLINIC_OR_DEPARTMENT_OTHER)
Admission: RE | Disposition: A | Discharge status: Home / Self Care | Payer: Self-pay | Source: Home / Self Care | Attending: Orthopedic Surgery

## 2019-09-16 ENCOUNTER — Ambulatory Visit (HOSPITAL_BASED_OUTPATIENT_CLINIC_OR_DEPARTMENT_OTHER)
Admission: RE | Admit: 2019-09-16 | Discharge: 2019-09-16 | Disposition: A | Discharge status: Home / Self Care | Payer: Medicare Other | Attending: Orthopedic Surgery | Admitting: Orthopedic Surgery

## 2019-09-16 ENCOUNTER — Other Ambulatory Visit: Payer: Self-pay

## 2019-09-16 DIAGNOSIS — K219 Gastro-esophageal reflux disease without esophagitis: Secondary | ICD-10-CM | POA: Diagnosis not present

## 2019-09-16 DIAGNOSIS — I252 Old myocardial infarction: Secondary | ICD-10-CM | POA: Insufficient documentation

## 2019-09-16 DIAGNOSIS — M67441 Ganglion, right hand: Secondary | ICD-10-CM | POA: Diagnosis not present

## 2019-09-16 DIAGNOSIS — I509 Heart failure, unspecified: Secondary | ICD-10-CM | POA: Insufficient documentation

## 2019-09-16 DIAGNOSIS — I110 Hypertensive heart disease with heart failure: Secondary | ICD-10-CM | POA: Insufficient documentation

## 2019-09-16 DIAGNOSIS — Z8614 Personal history of Methicillin resistant Staphylococcus aureus infection: Secondary | ICD-10-CM | POA: Insufficient documentation

## 2019-09-16 DIAGNOSIS — Z6826 Body mass index (BMI) 26.0-26.9, adult: Secondary | ICD-10-CM | POA: Diagnosis not present

## 2019-09-16 DIAGNOSIS — M65331 Trigger finger, right middle finger: Secondary | ICD-10-CM | POA: Insufficient documentation

## 2019-09-16 DIAGNOSIS — E782 Mixed hyperlipidemia: Secondary | ICD-10-CM | POA: Diagnosis not present

## 2019-09-16 DIAGNOSIS — M659 Synovitis and tenosynovitis, unspecified: Secondary | ICD-10-CM | POA: Diagnosis not present

## 2019-09-16 DIAGNOSIS — G4730 Sleep apnea, unspecified: Secondary | ICD-10-CM | POA: Insufficient documentation

## 2019-09-16 DIAGNOSIS — E119 Type 2 diabetes mellitus without complications: Secondary | ICD-10-CM | POA: Insufficient documentation

## 2019-09-16 DIAGNOSIS — Z955 Presence of coronary angioplasty implant and graft: Secondary | ICD-10-CM | POA: Insufficient documentation

## 2019-09-16 DIAGNOSIS — I2510 Atherosclerotic heart disease of native coronary artery without angina pectoris: Secondary | ICD-10-CM | POA: Insufficient documentation

## 2019-09-16 DIAGNOSIS — E039 Hypothyroidism, unspecified: Secondary | ICD-10-CM | POA: Diagnosis not present

## 2019-09-16 HISTORY — PX: TRIGGER FINGER RELEASE: SHX641

## 2019-09-16 LAB — GLUCOSE, CAPILLARY
Glucose-Capillary: 171 mg/dL — ABNORMAL HIGH (ref 70–99)
Glucose-Capillary: 202 mg/dL — ABNORMAL HIGH (ref 70–99)

## 2019-09-16 SURGERY — RELEASE, A1 PULLEY, FOR TRIGGER FINGER
Anesthesia: Monitor Anesthesia Care | Site: Middle Finger | Laterality: Right

## 2019-09-16 MED ORDER — LACTATED RINGERS IV SOLN: INTRAVENOUS | Status: DC

## 2019-09-16 MED ORDER — VANCOMYCIN HCL 1500 MG/300ML IV SOLN
1500.0000 mg | INTRAVENOUS | Status: AC
  Administered 2019-09-16: 1000 mg via INTRAVENOUS
  Filled 2019-09-16: qty 300

## 2019-09-16 MED ORDER — FENTANYL CITRATE (PF) 100 MCG/2ML IJ SOLN
INTRAMUSCULAR | Status: AC
  Filled 2019-09-16: qty 2

## 2019-09-16 MED ORDER — FENTANYL CITRATE (PF) 100 MCG/2ML IJ SOLN: 25.0000 ug | INTRAMUSCULAR | Status: DC | PRN

## 2019-09-16 MED ORDER — VANCOMYCIN HCL IN DEXTROSE 1-5 GM/200ML-% IV SOLN
INTRAVENOUS | Status: AC
  Filled 2019-09-16: qty 200

## 2019-09-16 MED ORDER — FENTANYL CITRATE (PF) 100 MCG/2ML IJ SOLN
INTRAMUSCULAR | Status: DC | PRN
Start: 2019-09-16 — End: 2019-09-16
  Administered 2019-09-16 (×2): 50 ug via INTRAVENOUS

## 2019-09-16 MED ORDER — LIDOCAINE HCL (CARDIAC) PF 100 MG/5ML IV SOSY
PREFILLED_SYRINGE | INTRAVENOUS | Status: DC | PRN
  Administered 2019-09-16: 30 mg via INTRAVENOUS

## 2019-09-16 MED ORDER — VANCOMYCIN HCL IN DEXTROSE 500-5 MG/100ML-% IV SOLN
INTRAVENOUS | Status: AC
  Filled 2019-09-16: qty 100

## 2019-09-16 MED ORDER — ONDANSETRON HCL 4 MG/2ML IJ SOLN
INTRAMUSCULAR | Status: DC | PRN
  Administered 2019-09-16: 4 mg via INTRAVENOUS

## 2019-09-16 MED ORDER — BUPIVACAINE HCL (PF) 0.25 % IJ SOLN
INTRAMUSCULAR | Status: DC | PRN
  Administered 2019-09-16: 6 mL

## 2019-09-16 MED ORDER — LIDOCAINE HCL (PF) 0.5 % IJ SOLN
INTRAMUSCULAR | Status: DC | PRN
  Administered 2019-09-16: 30 mL via INTRAVENOUS

## 2019-09-16 MED ORDER — PROPOFOL 500 MG/50ML IV EMUL
INTRAVENOUS | Status: DC | PRN
  Administered 2019-09-16: 75 ug/kg/min via INTRAVENOUS

## 2019-09-16 MED ORDER — BUPIVACAINE HCL (PF) 0.25 % IJ SOLN
INTRAMUSCULAR | Status: AC
  Filled 2019-09-16: qty 30

## 2019-09-16 MED ORDER — TRAMADOL HCL 50 MG PO TABS: 50.0000 mg | ORAL_TABLET | Freq: Four times a day (QID) | ORAL | 0 refills | Status: DC | PRN

## 2019-09-16 SURGICAL SUPPLY — 35 items
APL PRP STRL LF DISP 70% ISPRP (MISCELLANEOUS) ×1
BLADE SURG 15 STRL LF DISP TIS (BLADE) ×1 IMPLANT
BLADE SURG 15 STRL SS (BLADE) ×2
BNDG CMPR 9X4 STRL LF SNTH (GAUZE/BANDAGES/DRESSINGS)
BNDG COHESIVE 2X5 TAN STRL LF (GAUZE/BANDAGES/DRESSINGS) ×2 IMPLANT
BNDG ESMARK 4X9 LF (GAUZE/BANDAGES/DRESSINGS) IMPLANT
CHLORAPREP W/TINT 26 (MISCELLANEOUS) ×2 IMPLANT
CORD BIPOLAR FORCEPS 12FT (ELECTRODE) IMPLANT
COVER BACK TABLE 60X90IN (DRAPES) ×2 IMPLANT
COVER MAYO STAND STRL (DRAPES) ×2 IMPLANT
COVER WAND RF STERILE (DRAPES) IMPLANT
CUFF TOURN SGL QUICK 18X4 (TOURNIQUET CUFF) IMPLANT
DECANTER SPIKE VIAL GLASS SM (MISCELLANEOUS) IMPLANT
DRAPE EXTREMITY T 121X128X90 (DISPOSABLE) ×2 IMPLANT
DRAPE SURG 17X23 STRL (DRAPES) ×2 IMPLANT
GAUZE SPONGE 4X4 12PLY STRL (GAUZE/BANDAGES/DRESSINGS) ×2 IMPLANT
GAUZE XEROFORM 1X8 LF (GAUZE/BANDAGES/DRESSINGS) ×2 IMPLANT
GLOVE BIOGEL PI IND STRL 7.0 (GLOVE) ×2 IMPLANT
GLOVE BIOGEL PI IND STRL 8.5 (GLOVE) ×1 IMPLANT
GLOVE BIOGEL PI INDICATOR 7.0 (GLOVE) ×2
GLOVE BIOGEL PI INDICATOR 8.5 (GLOVE) ×1
GLOVE ECLIPSE 6.5 STRL STRAW (GLOVE) ×2 IMPLANT
GLOVE SURG ORTHO 8.0 STRL STRW (GLOVE) ×2 IMPLANT
GOWN STRL REUS W/ TWL LRG LVL3 (GOWN DISPOSABLE) ×1 IMPLANT
GOWN STRL REUS W/TWL LRG LVL3 (GOWN DISPOSABLE) ×2
GOWN STRL REUS W/TWL XL LVL3 (GOWN DISPOSABLE) ×2 IMPLANT
NEEDLE PRECISIONGLIDE 27X1.5 (NEEDLE) ×2 IMPLANT
NS IRRIG 1000ML POUR BTL (IV SOLUTION) ×2 IMPLANT
PACK BASIN DAY SURGERY FS (CUSTOM PROCEDURE TRAY) ×2 IMPLANT
STOCKINETTE 4X48 STRL (DRAPES) ×2 IMPLANT
SUT ETHILON 4 0 PS 2 18 (SUTURE) ×2 IMPLANT
SYR BULB EAR ULCER 3OZ GRN STR (SYRINGE) ×2 IMPLANT
SYR CONTROL 10ML LL (SYRINGE) ×2 IMPLANT
TOWEL GREEN STERILE FF (TOWEL DISPOSABLE) ×4 IMPLANT
UNDERPAD 30X36 HEAVY ABSORB (UNDERPADS AND DIAPERS) ×2 IMPLANT

## 2019-09-16 NOTE — Discharge Instructions (Signed)

## 2019-09-16 NOTE — H&P (Signed)
[name] is an 69 y.o. male.   Chief Complaint: catching right middle finger HPI: [name] is a 69 year old who returns the office today having had multiple trigger digits in the past. He is complaining of his right middle finger which is been going on for approximately 1 year. He recalls no history of injury. States nothing makes it better or worse. Is not complain of a great deal of pain. States he does not have to use his other hand to straighten it is worse in the morning. It gradually improves during the day.   He has had 2 injections to the A1 pulley in the past. He continues to complain of discomfort this is the dorsal aspect of his middle finger when he uses it. He has no history of injury to it. He has had trigger fingers in the past he has a history of diabetes thyroid problems and arthritis. He has no history of gout. Family history is positive diabetes negative for the remainder. He states that it is worse in the morning. He has tried splinting. He states nothing seems to make it better or worse at this time. Is reluctant to make composite fist   Past Medical History:  Diagnosis Date  . Coronary artery disease    a. remote stenting to ramus, prox LAD x2.  . Dental crowns present   . Diabetes mellitus   . First degree AV block   . GERD (gastroesophageal reflux disease)   . Heart attack ([place]) 06/2004  . Heart murmur    aortic  . Hx MRSA infection 02/2006  . Hyperlipidemia   . Hypertension    hx. of - has not been on med. since losing wt. after gastric bypass  . Hypothyroidism   . Morbid obesity ([place])   . Mucoid cyst of joint 09/2011   left thumb  . Sleep apnea    sleep study 03/29/2011; no CPAP use, lost >130lbs  . Trifascicular block     Past Surgical History:  Procedure Laterality Date  . AMPUTATION Left 04/19/2016   Procedure: Left Foot 4th Toe Amputation vs. Transmetatarsal;  Surgeon: [name], MD;  Location: [place];  Service: Orthopedics;  Laterality: Left;  . BIOPSY   04/07/2019   Procedure: BIOPSY;  Surgeon: Otis Brace, MD;  [facility];  Service: Gastroenterology;;  . CATARACT EXTRACTION  02/2010; 03/2010  . COLONOSCOPY WITH PROPOFOL N/A 09/14/2014   Procedure: COLONOSCOPY WITH PROPOFOL;  Surgeon: Garlan Fair, MD;  [facility];  Service: Endoscopy;  Laterality: N/A;  . COLONOSCOPY WITH PROPOFOL N/A 04/07/2019   Procedure: COLONOSCOPY WITH PROPOFOL;  Surgeon: Otis Brace, MD;  [facility];  Service: Gastroenterology;  Laterality: N/A;  . CORONARY ANGIOPLASTY WITH STENT PLACEMENT  07/12/2004; 08/03/2004   total of 3 stents  . I & D EXTREMITY Right 06/04/2014   Procedure: IRRIGATION AND DEBRIDEMENT EXTREMITY;  Surgeon: [name], MD;  Location: [place];  Service: Orthopedics;  Laterality: Right;  . MASS EXCISION  10/04/2011   Procedure: EXCISION MASS;  Surgeon: Wynonia Sours, MD;  Location: [place];  Service: Orthopedics;  Laterality: Left;  excision cyst debridment ip joint of left thumb  . PROSTATECTOMY    . ROUX-EN-Y GASTRIC BYPASS  10/10/2010   laparoscopic  . TOE AMPUTATION  02/23/2006   left foot second ray amputation    Family History  Problem Relation Age of Onset  . Heart disease Mother    Social History:  reports that  he has never smoked. He has never used smokeless tobacco. He reports that he does not drink alcohol and does not use drugs.  Allergies:  Allergies  Allergen Reactions  . Bactrim [Sulfamethoxazole-Trimethoprim] Itching and Rash  . Moxifloxacin Rash  . Penicillins Rash     Has patient had a PCN reaction causing immediate rash, facial/tongue/throat swelling, SOB or lightheadedness with hypotension: #  #  #  YES  #  #  #  Has patient had a PCN reaction causing severe rash involving mucus membranes or skin necrosis: No Has patient had a PCN reaction that required hospitalization unknown Has patient had a PCN reaction occurring within the last 10 years: No If  all of the above answers are "NO", then may proceed with Cephalosporin use.  . Quinolones Itching    No medications prior to admission.    No results found for this or any previous visit (from the past 48 hour(s)).  No results found.   Pertinent items are noted in HPI.  Height 6\' 8"  (2.032 m), weight 110.3 kg.  General appearance: alert, cooperative and appears stated age Head: Normocephalic, without obvious abnormality Neck: no JVD Resp: clear to auscultation bilaterally Cardio: regular rate and rhythm, S1, S2 normal, no murmur, click, rub or gallop GI: soft, non-tender; bowel sounds normal; no masses,  no organomegaly Extremities: catching right middle finger Pulses: 2+ and symmetric Skin: Skin color, texture, turgor normal. No rashes or lesions Neurologic: Grossly normal Incision/Wound: na  Assessment/Plan Assessment:  1. Trigger middle finger of right hand    Plan: We have discussed surgical release of the A1 pulley with him. Preperi-and postoperative course are discussed along with risks and complications. He is aware there is no guarantee to the surgery the possibility of infection recurrence injury to arteries nerves tendons incomplete relief of symptoms dystrophy. He is scheduled for release A1 pulley right middle finger as an outpatient under regional anesthesia.   Broghan Pannone 09/16/2019, 5:59 AM

## 2019-09-16 NOTE — Transfer of Care (Signed)
Immediate Anesthesia Transfer of Care Note  Patient: [name]  Procedure(s) Performed: RELEASE TRIGGER FINGER/A-1 PULLEY (Right Middle Finger)  Patient Location: PACU  Anesthesia Type:Bier block  Level of Consciousness: awake, alert , oriented and patient cooperative  Airway & Oxygen Therapy: Patient Spontanous Breathing and Patient connected to face mask oxygen  Post-op Assessment: Report given to RN and Post -op Vital signs reviewed and stable  Post vital signs: Reviewed and stable  Last Vitals:  Vitals Value Taken Time  BP    Temp    Pulse 49 09/16/19 0856  Resp 12 09/16/19 0856  SpO2 99 % 09/16/19 0856  Vitals shown include unvalidated device data.  Last Pain:  Vitals:   09/16/19 0726  TempSrc: Oral  PainSc: 2          Complications: No complications documented.

## 2019-09-16 NOTE — Anesthesia Postprocedure Evaluation (Signed)
Anesthesia Post Note  Patient: [name]  Procedure(s) Performed: RELEASE TRIGGER FINGER/A-1 PULLEY (Right Middle Finger)     Patient location during evaluation: PACU Anesthesia Type: MAC and Bier Block Level of consciousness: awake and alert Pain management: pain level controlled Vital Signs Assessment: post-procedure vital signs reviewed and stable Respiratory status: spontaneous breathing, nonlabored ventilation, respiratory function stable and patient connected to nasal cannula oxygen Cardiovascular status: stable and blood pressure returned to baseline Postop Assessment: no apparent nausea or vomiting Anesthetic complications: no   No complications documented.  Last Vitals:  Vitals:   09/16/19 0925 09/16/19 0943  BP: 132/70 (!) 157/65  Pulse: (!) 47 (!) 48  Resp: 14 18  Temp:  36.8 C  SpO2: 97% 98%    Last Pain:  Vitals:   09/16/19 0943  TempSrc:   PainSc: 0-No pain                 Breckyn Ticas L Desmund Elman

## 2019-09-16 NOTE — Anesthesia Preprocedure Evaluation (Addendum)
Anesthesia Evaluation  Patient identified by MRN, date of birth, ID band Patient awake    Reviewed: Allergy & Precautions, NPO status , Patient's Chart, lab work & pertinent test results  Airway Mallampati: II  TM Distance: >3 FB Neck ROM: Full    Dental no notable dental hx. (+) Teeth Intact, Dental Advisory Given   Pulmonary sleep apnea (does not use CPAP) ,    Pulmonary exam normal breath sounds clear to auscultation       Cardiovascular hypertension, + CAD, + Past MI, + Cardiac Stents, + Peripheral Vascular Disease and +CHF  Normal cardiovascular exam+ dysrhythmias (trifascicular block)  Rhythm:Regular Rate:Normal  TTE 2018 EF 45-50%, mild AS, mild MR   Neuro/Psych negative neurological ROS  negative psych ROS   GI/Hepatic Neg liver ROS, GERD  ,  Endo/Other  diabetesHypothyroidism   Renal/GU negative Renal ROS  negative genitourinary   Musculoskeletal  (+) Arthritis ,   Abdominal   Peds  Hematology negative hematology ROS (+)   Anesthesia Other Findings   Reproductive/Obstetrics                            Anesthesia Physical Anesthesia Plan  ASA: III  Anesthesia Plan: MAC and Bier Block and Bier Block-LIDOCAINE ONLY   Post-op Pain Management:    Induction: Intravenous  PONV Risk Score and Plan: 1 and Propofol infusion and Treatment may vary due to age or medical condition  Airway Management Planned: Natural Airway  Additional Equipment:   Intra-op Plan:   Post-operative Plan:   Informed Consent: I have reviewed the patients History and Physical, chart, labs and discussed the procedure including the risks, benefits and alternatives for the proposed anesthesia with the patient or authorized representative who has indicated his/her understanding and acceptance.     Dental advisory given  Plan Discussed with: CRNA  Anesthesia Plan Comments:         Anesthesia  Quick Evaluation

## 2019-09-16 NOTE — Brief Op Note (Signed)
09/16/2019  8:53 AM  PATIENT:  Landis Martins  69 y.o. male  PRE-OPERATIVE DIAGNOSIS:  STENOSING TENOSYNOVITIS RIGHT MIDDLE FINGER  POST-OPERATIVE DIAGNOSIS:  STENOSING TENOSYNOVITIS RIGHT MIDDLE FINGER  PROCEDURE:  Procedure(s) with comments: RELEASE TRIGGER FINGER/A-1 PULLEY (Right) - IV REGIONAL FOREARM BLOCK removal flexor sheath cysts  SURGEON:  Surgeon(s) and Role:    Daryll Brod, MD - Primary  PHYSICIAN ASSISTANT:   ASSISTANTS: none   ANESTHESIA:   local, regional and IV sedation  EBL:  48ml  BLOOD ADMINISTERED:none  DRAINS: none   LOCAL MEDICATIONS USED:  BUPIVICAINE   SPECIMEN:  No Specimen  DISPOSITION OF SPECIMEN:  N/A  COUNTS:  YES  TOURNIQUET:   Total Tourniquet Time Documented: Forearm (Right) - 16 minutes Total: Forearm (Right) - 16 minutes   DICTATION: .Dragon Dictation  PLAN OF CARE: Discharge to home after PACU  PATIENT DISPOSITION:  PACU - hemodynamically stable.   D

## 2019-09-16 NOTE — Anesthesia Procedure Notes (Signed)
Procedure Name: MAC Date/Time: 09/16/2019 8:37 AM Performed by: Signe Colt, CRNA Pre-anesthesia Checklist: Patient identified, Emergency Drugs available, Suction available, Patient being monitored and Timeout performed Patient Re-evaluated:Patient Re-evaluated prior to induction Oxygen Delivery Method: Simple face mask

## 2019-09-16 NOTE — Anesthesia Procedure Notes (Signed)
Anesthesia Regional Block: Bier block (IV Regional)   Pre-Anesthetic Checklist: ,, timeout performed, Correct Patient, Correct Site, Correct Laterality, Correct Procedure,, site marked, surgical consent,, at surgeon's request  Laterality: Right     Needles:  Injection technique: Single-shot  Needle Type: Other      Needle Gauge: 20     Additional Needles:   Procedures:,,,,, intact distal pulses, Esmarch exsanguination, single tourniquet utilized,  Narrative:   Performed by: Personally       

## 2019-09-16 NOTE — Op Note (Signed)
NAME: [name] MEDICAL RECORD NO: [id number] DATE OF BIRTH: March 14, 1950 FACILITY: [facility] LOCATION: [facility] PHYSICIAN: Wynonia Sours, MD   OPERATIVE REPORT   DATE OF PROCEDURE: 09/16/19    PREOPERATIVE DIAGNOSIS:   Stenosing tenosynovitis right middle finger flexor sheath cyst   POSTOPERATIVE DIAGNOSIS:   Same   PROCEDURE:   Release A1 pulley with removal flexor sheath cyst right middle finger   SURGEON: Daryll Brod, M.D.   ASSISTANT: none   ANESTHESIA:  Bier block with sedation and Local   INTRAVENOUS FLUIDS:  Per anesthesia flow sheet.   ESTIMATED BLOOD LOSS:  Minimal.   COMPLICATIONS:  None.   SPECIMENS:  none   TOURNIQUET TIME:    Total Tourniquet Time Documented: Forearm (Right) - 16 minutes Total: Forearm (Right) - 16 minutes    DISPOSITION:  Stable to PACU.   INDICATIONS: Patient is a 69 year old male with history of triggering of his right middle finger.  This not responded to conservative treatment he is elected undergo surgical release of the A1 pulley.  Preperi-and postoperative course been discussed along with risk complications.  He is aware that there is no guarantee to the surgery the possibility of infection recurrence injury to arteries nerves tendons incomplete relief of symptoms and dystrophy.  In preoperative.  Patient seen extremity marked by both patient and surgeon antibiotic given  OPERATIVE COURSE: Patient is brought to the operating room forearm-based IV regional anesthetic was carried out without difficulty under the direction of the anesthesia department.  He was prepped using ChloraPrep in the supine position with the right arm free.  3-minute dry time was allowed and timeout taken to confirm patient procedure.  An oblique incision was made over the A1 pulley of the right middle finger carried down through subcutaneous tissue.  Bleeders were electrocauterized as necessary with bipolar.  Retractors were placed retracting  neurovascular bundle radially and ulnarly and markedly thickened A1 pulley was then identified.  2 flexor sheath cyst were present these were removed.  The A1 pulley was then released on its radial aspect a small incision made centrally and A2.  The 2 tendons were then separated using retractors breaking any adhesions between the 2 tendons.  The finger was placed through full passive range of motion no further triggering was noted.  The wound was copious irrigated with saline.  The skin was closed interrupted 4-0 nylon sutures.  A local infiltration quarter percent bupivacaine without epinephrine was given approximately 8 cc was used.  Sterile compressive dressing with fingers 3 was applied.  Inflation the tourniquet all fingers immediately pink.  He was taken to the recovery room for observation in satisfactory condition.  He will be discharged home to return to the hand center of [facility] in 1 week Tylenol ibuprofen for pain with Ultram for breakthrough.   Daryll Brod, MD Electronically signed, 09/16/19

## 2019-09-17 ENCOUNTER — Encounter (HOSPITAL_BASED_OUTPATIENT_CLINIC_OR_DEPARTMENT_OTHER): Payer: Self-pay | Admitting: Orthopedic Surgery

## 2019-10-14 DIAGNOSIS — Z23 Encounter for immunization: Secondary | ICD-10-CM | POA: Diagnosis not present

## 2019-10-17 DIAGNOSIS — E1149 Type 2 diabetes mellitus with other diabetic neurological complication: Secondary | ICD-10-CM | POA: Diagnosis not present

## 2019-10-17 DIAGNOSIS — F5101 Primary insomnia: Secondary | ICD-10-CM | POA: Diagnosis not present

## 2019-10-17 DIAGNOSIS — I10 Essential (primary) hypertension: Secondary | ICD-10-CM | POA: Diagnosis not present

## 2019-10-17 DIAGNOSIS — R21 Rash and other nonspecific skin eruption: Secondary | ICD-10-CM | POA: Diagnosis not present

## 2019-11-12 ENCOUNTER — Other Ambulatory Visit: Payer: Self-pay | Admitting: Cardiology

## 2019-11-16 DIAGNOSIS — Z23 Encounter for immunization: Secondary | ICD-10-CM | POA: Diagnosis not present

## 2019-11-22 ENCOUNTER — Other Ambulatory Visit: Payer: Self-pay | Admitting: Cardiology

## 2019-12-22 ENCOUNTER — Encounter: Payer: Self-pay | Admitting: Cardiology

## 2019-12-22 ENCOUNTER — Other Ambulatory Visit: Payer: Self-pay

## 2019-12-22 ENCOUNTER — Ambulatory Visit (INDEPENDENT_AMBULATORY_CARE_PROVIDER_SITE_OTHER): Payer: Medicare Other | Admitting: Cardiology

## 2019-12-22 VITALS — BP 124/76 | HR 63 | Ht >= 80 in | Wt 245.4 lb

## 2019-12-22 DIAGNOSIS — I2510 Atherosclerotic heart disease of native coronary artery without angina pectoris: Secondary | ICD-10-CM | POA: Diagnosis not present

## 2019-12-22 DIAGNOSIS — Z89511 Acquired absence of right leg below knee: Secondary | ICD-10-CM | POA: Diagnosis not present

## 2019-12-22 DIAGNOSIS — I10 Essential (primary) hypertension: Secondary | ICD-10-CM | POA: Diagnosis not present

## 2019-12-22 DIAGNOSIS — E782 Mixed hyperlipidemia: Secondary | ICD-10-CM | POA: Diagnosis not present

## 2019-12-22 MED ORDER — LISINOPRIL 2.5 MG PO TABS: 2.5000 mg | ORAL_TABLET | Freq: Every day | ORAL | 3 refills | Status: DC

## 2019-12-22 NOTE — Progress Notes (Signed)
Cardiology Office Note:    Date:  12/22/2019   ID:  Landis Martins, DOB Nov 19, 1950, MRN [id number]  PCP:  [name], MD  [facility] Cardiologist:  Candee Furbish, MD  [facility] Electrophysiologist:  None   Referring MD: [name], MD     History of Present Illness:    [name] is a 69 y.o. male with coronary disease hypertension hyperlipidemia diabetes type 2 OSA here for follow-up.  PCI to ramus in 2006.  2 stents to the proximal LAD.  Diffuse disease in distal RCA.  Nuclear stress test 2017 showed fixed inferior defect.  Underwent gastric bypass surgery in September 2012.  Good weight loss.  First-degree AV block bifascicular block sinus bradycardia noted previously.  Echocardiogram with 42 mm dilated aortic root, mild aortic stenosis and mild aortic regurgitation.  EF of 45 to 50%.  He was last seen by [name].  Underwent orthopedic surgery on hand, doing well.  Overall been doing quite well no fevers chills nausea vomiting syncope bleeding.  Is having some dizziness when getting up from a seated position.  Occasionally.  No chest pain  Past Medical History:  Diagnosis Date  . Coronary artery disease    a. remote stenting to ramus, prox LAD x2.  . Dental crowns present   . Diabetes mellitus   . First degree AV block   . GERD (gastroesophageal reflux disease)   . Heart attack ([place]) 06/2004  . Heart murmur    aortic  . Hx MRSA infection 02/2006  . Hyperlipidemia   . Hypertension    hx. of - has not been on med. since losing wt. after gastric bypass  . Hypothyroidism   . Morbid obesity ([place])   . Mucoid cyst of joint 09/2011   left thumb  . Sleep apnea    sleep study 03/29/2011; no CPAP use, lost >130lbs  . Trifascicular block     Past Surgical History:  Procedure Laterality Date  . AMPUTATION Left 04/19/2016   Procedure: Left Foot 4th Toe Amputation vs. Transmetatarsal;  Surgeon: [name], MD;  Location: [place];  Service: Orthopedics;  Laterality:  Left;  . BIOPSY  04/07/2019   Procedure: BIOPSY;  Surgeon: Otis Brace, MD;  [facility];  Service: Gastroenterology;;  . CATARACT EXTRACTION  02/2010; 03/2010  . COLONOSCOPY WITH PROPOFOL N/A 09/14/2014   Procedure: COLONOSCOPY WITH PROPOFOL;  Surgeon: Garlan Fair, MD;  [facility];  Service: Endoscopy;  Laterality: N/A;  . COLONOSCOPY WITH PROPOFOL N/A 04/07/2019   Procedure: COLONOSCOPY WITH PROPOFOL;  Surgeon: Otis Brace, MD;  [facility];  Service: Gastroenterology;  Laterality: N/A;  . CORONARY ANGIOPLASTY WITH STENT PLACEMENT  07/12/2004; 08/03/2004   total of 3 stents  . I & D EXTREMITY Right 06/04/2014   Procedure: IRRIGATION AND DEBRIDEMENT EXTREMITY;  Surgeon: [name], MD;  Location: [place];  Service: Orthopedics;  Laterality: Right;  . MASS EXCISION  10/04/2011   Procedure: EXCISION MASS;  Surgeon: Wynonia Sours, MD;  Location: [place];  Service: Orthopedics;  Laterality: Left;  excision cyst debridment ip joint of left thumb  . PROSTATECTOMY    . ROUX-EN-Y GASTRIC BYPASS  10/10/2010   laparoscopic  . TOE AMPUTATION  02/23/2006   left foot second ray amputation  . TRIGGER FINGER RELEASE Right 09/16/2019   Procedure: RELEASE TRIGGER FINGER/A-1 PULLEY;  Surgeon: Daryll Brod, MD;  Location: [place];  Service: Orthopedics;  Laterality: Right;  IV  REGIONAL FOREARM BLOCK    Current Medications: Current Meds  Medication Sig  . aspirin EC 81 MG tablet Take 81 mg by mouth daily.  Marland Kitchen atorvastatin (LIPITOR) 10 MG tablet Take 10 mg by mouth at bedtime.   Marland Kitchen CALCIUM CITRATE PO Take 500 mg by mouth 3 (three) times daily.  . Cyanocobalamin (B-12) 500 MCG SUBL Place 500 mcg under the tongue every Monday, Wednesday, and Friday.   . ferrous sulfate 325 (65 FE) MG tablet Take 325 mg by mouth daily.   . fluticasone (FLONASE) 50 MCG/ACT nasal spray Place 2 sprays into both nostrils daily as needed for  allergies.   Marland Kitchen ibuprofen (ADVIL,MOTRIN) 200 MG tablet Take 400 mg by mouth every 6 (six) hours as needed for headache (pain).  Marland Kitchen levothyroxine (SYNTHROID) 150 MCG tablet Take 150 mcg by mouth daily before breakfast.  . Melatonin 10 MG TABS Take 10 mg/L by mouth at bedtime.  . metFORMIN (GLUCOPHAGE-XR) 500 MG 24 hr tablet Take 500 mg by mouth 2 (two) times daily.  . Multiple Vitamin (MULTIVITAMIN WITH MINERALS) TABS tablet Take 1 tablet by mouth 2 (two) times daily.   . Multiple Vitamins-Minerals (PRESERVISION AREDS 2) CAPS Take 1 capsule by mouth 2 (two) times daily.  . nitroGLYCERIN (NITROSTAT) 0.4 MG SL tablet PLACE 1 TABLET (0.4 MG TOTAL) UNDER THE TONGUE EVERY 5 (FIVE) MINUTES X 3 DOSES AS NEEDED FOR CHEST PAIN.  Marland Kitchen Omega-3 Fatty Acids (FISH OIL TRIPLE STRENGTH PO) Take 1 capsule by mouth daily. Chewable  . Omeprazole 20 MG TBEC Take 20 mg by mouth daily as needed (acid reflux/ indigestion).   Marland Kitchen OVER THE COUNTER MEDICATION Take 1 tablet by mouth 2 (two) times daily. [facility] Complex: glucosamine/cat's claw extract  . sildenafil (REVATIO) 20 MG tablet Take 40-100 mg by mouth daily as needed (Erectile Dysfunction).   . [DISCONTINUED] lisinopril (ZESTRIL) 5 MG tablet TAKE 1 TABLET BY MOUTH EVERY DAY     Allergies:   Bactrim [sulfamethoxazole-trimethoprim], Moxifloxacin, Penicillins, and Quinolones   Social History   Socioeconomic History  . Marital status: Married    Spouse name: Not on file  . Number of children: Not on file  . Years of education: Not on file  . Highest education level: Not on file  Occupational History  . Occupation: Engineer, maintenance (IT) taxes  Tobacco Use  . Smoking status: Never Smoker  . Smokeless tobacco: Never Used  Substance and Sexual Activity  . Alcohol use: No  . Drug use: No  . Sexual activity: Yes  Other Topics Concern  . Not on file  Social History Narrative   Parents and sister all using CPAP   Social Determinants of Health   Financial Resource  Strain:   . Difficulty of Paying Living Expenses: Not on file  Food Insecurity:   . Worried About Charity fundraiser in the Last Year: Not on file  . Ran Out of Food in the Last Year: Not on file  Transportation Needs:   . Lack of Transportation (Medical): Not on file  . Lack of Transportation (Non-Medical): Not on file  Physical Activity:   . Days of Exercise per Week: Not on file  . Minutes of Exercise per Session: Not on file  Stress:   . Feeling of Stress : Not on file  Social Connections:   . Frequency of Communication with Friends and Family: Not on file  . Frequency of Social Gatherings with Friends and Family: Not on file  . Attends  Religious Services: Not on file  . Active Member of Clubs or Organizations: Not on file  . Attends Archivist Meetings: Not on file  . Marital Status: Not on file     Family History: The patient's family history includes Heart disease in his mother.  ROS:   Please see the history of present illness.     All other systems reviewed and are negative.  EKGs/Labs/Other Studies Reviewed:    The following studies were reviewed today: above  EKG: Bifascicular block Recent Labs: 04/07/2019: BUN 11; Creatinine, Ser 0.90; Hemoglobin 15.6; Potassium 4.0; Sodium 142  Recent Lipid Panel No results found for: CHOL, TRIG, HDL, CHOLHDL, VLDL, LDLCALC, LDLDIRECT   Risk Assessment/Calculations:       Physical Exam:    VS:  BP 124/76   Pulse 63   Ht 6\' 8"  (2.032 m)   Wt 245 lb 6.4 oz (111.3 kg)   SpO2 94%   BMI 26.96 kg/m     Wt Readings from Last 3 Encounters:  12/22/19 245 lb 6.4 oz (111.3 kg)  09/16/19 241 lb 13.5 oz (109.7 kg)  09/12/19 243 lb 3.2 oz (110.3 kg)     GEN:  Well nourished, well developed in no acute distress HEENT: Normal NECK: No JVD; No carotid bruits LYMPHATICS: No lymphadenopathy CARDIAC: RRR, no murmurs, rubs, gallops RESPIRATORY:  Clear to auscultation without rales, wheezing or rhonchi  ABDOMEN:  Soft, non-tender, non-distended MUSCULOSKELETAL:  No edema; right lower extremity amputation SKIN: Warm and dry NEUROLOGIC:  Alert and oriented x 3 PSYCHIATRIC:  Normal affect   ASSESSMENT:    1. Coronary artery disease involving native coronary artery of native heart without angina pectoris   2. Essential hypertension   3. HYPERLIPIDEMIA, MIXED   4. Status post below-knee amputation of right lower extremity (HCC)    PLAN:    In order of problems listed above:  Coronary artery disease -Doing well.  No changes made.  Continue with aspirin.  Atorvastatin.  Essential hypertension/orthostatic hypotension/diabetes mellitus -Occasionally will feel some symptoms of lightheadedness when getting up from a bent over position or a seated position.  We will decrease his lisinopril from 5 mg down to 2.5 mg.  He is taking this mainly for renal protection in the setting of his diabetes.  Bifascicular block -Overall doing well without any syncopal symptoms.  No high risk symptoms.  Continue to monitor.  May need pacemaker in the future.  Right BKA -Doing well.  Prior gastric bypass -Maintaining weight.  Maximum weight 361 at one point.  Hemoglobin A1c 7.0 LDL 59 ALT 25 creatinine 0.9    Medication Adjustments/Labs and Tests Ordered: Current medicines are reviewed at length with the patient today.  Concerns regarding medicines are outlined above.  No orders of the defined types were placed in this encounter.  Meds ordered this encounter  Medications  . lisinopril (ZESTRIL) 2.5 MG tablet    Sig: Take 1 tablet (2.5 mg total) by mouth daily.    Dispense:  90 tablet    Refill:  3    Pt will call when needed    Patient Instructions  Medication Instructions:  Please decrease Lisinopril to 2.5 mg a day.  Continue all other medications as listed.  *If you need a refill on your cardiac medications before your next appointment, please call your pharmacy*  Follow-Up: At [facility],  you and your health needs are our priority.  As part of our continuing mission to provide you with exceptional  heart care, we have created designated Provider Care Teams.  These Care Teams include your primary Cardiologist (physician) and Advanced Practice Providers (APPs -  Physician Assistants and Nurse Practitioners) who all work together to provide you with the care you need, when you need it.  We recommend signing up for the patient portal called "MyChart".  Sign up information is provided on this After Visit Summary.  MyChart is used to connect with patients for Virtual Visits (Telemedicine).  Patients are able to view lab/test results, encounter notes, upcoming appointments, etc.  Non-urgent messages can be sent to your provider as well.   To learn more about what you can do with MyChart, go to [email or web address].    Your next appointment:   12 month(s)  The format for your next appointment:   In Person  Provider:   Candee Furbish, MD   Thank you for choosing [facility]!!         Signed, Candee Furbish, MD  12/22/2019 10:20 AM    [place]

## 2019-12-22 NOTE — Patient Instructions (Signed)
Medication Instructions:  Please decrease Lisinopril to 2.5 mg a day.  Continue all other medications as listed.  *If you need a refill on your cardiac medications before your next appointment, please call your pharmacy*  Follow-Up: At [facility], you and your health needs are our priority.  As part of our continuing mission to provide you with exceptional heart care, we have created designated Provider Care Teams.  These Care Teams include your primary Cardiologist (physician) and Advanced Practice Providers (APPs -  Physician Assistants and Nurse Practitioners) who all work together to provide you with the care you need, when you need it.  We recommend signing up for the patient portal called "MyChart".  Sign up information is provided on this After Visit Summary.  MyChart is used to connect with patients for Virtual Visits (Telemedicine).  Patients are able to view lab/test results, encounter notes, upcoming appointments, etc.  Non-urgent messages can be sent to your provider as well.   To learn more about what you can do with MyChart, go to [email or web address].    Your next appointment:   12 month(s)  The format for your next appointment:   In Person  Provider:   Candee Furbish, MD   Thank you for choosing [facility]!!

## 2019-12-31 ENCOUNTER — Ambulatory Visit (INDEPENDENT_AMBULATORY_CARE_PROVIDER_SITE_OTHER): Payer: Medicare Other | Admitting: Orthopaedic Surgery

## 2019-12-31 ENCOUNTER — Encounter: Payer: Self-pay | Admitting: Orthopaedic Surgery

## 2019-12-31 ENCOUNTER — Ambulatory Visit (INDEPENDENT_AMBULATORY_CARE_PROVIDER_SITE_OTHER): Payer: Medicare Other

## 2019-12-31 DIAGNOSIS — M5431 Sciatica, right side: Secondary | ICD-10-CM | POA: Diagnosis not present

## 2019-12-31 DIAGNOSIS — M25511 Pain in right shoulder: Secondary | ICD-10-CM

## 2019-12-31 DIAGNOSIS — I2510 Atherosclerotic heart disease of native coronary artery without angina pectoris: Secondary | ICD-10-CM | POA: Diagnosis not present

## 2019-12-31 NOTE — Progress Notes (Signed)
Office Visit Note   Patient: [name]           Date of Birth: [birth year]           MRN: [id number] Visit Date: 12/31/2019              Requested by: [name], MD [address] [place],  [place] [address] PCP: [name], MD   Assessment & Plan: Visit Diagnoses:  1. Right shoulder pain, unspecified chronicity   2. Sciatica, right side     Plan: I do feel that he would benefit from a right shoulder injection anteriorly by Dr. Ernestina Patches under fluoroscopy and potentially even in the biceps tendon sheath area release in the shoulder joint of both.  He would also benefit from a left-sided L5-S1 ESI given his sciatic symptoms.  He agrees with this treatment plan.  I will see him back in about 6 weeks because hopefully he will have had his interventions by then.  All questions and concerns were answered and addressed.  Follow-Up Instructions: Return in about 6 weeks (around 02/11/2020).   Orders:  Orders Placed This Encounter  Procedures  . XR Shoulder Right   No orders of the defined types were placed in this encounter.     Procedures: No procedures performed   Clinical Data: No additional findings.   Subjective: Chief Complaint  Patient presents with  . Right Shoulder - Pain  . Left Hip - Pain  The patient is well-known to me.  I have seen him before for right shoulder pain and left hip pain.  His pain in his left hip now is more in the sciatic region.  He does get some tightness in the back of his leg as well.  He does not like sitting on that side when he is been driving or sitting down for long period of time.  He is a diabetic.  His right shoulder has showed inflammation in the past.  Previous x-rays did not show any thing remarkable.  He has had MRI of his lumbar spine earlier this year showing moderate stenosis bilaterally at the foramina at L5-S1.  His blood glucose has been running a little high.  He denies any specific injury but does report when he  was reaching for some with his right shoulder he got more pain and he points to more the biceps tendon area as a source of his pain.  He has tried Voltaren gel and takes Tylenol PM.  HPI  Review of Systems He currently denies any headache, chest pain, shortness of breath, fever, chills, nausea, vomiting  Objective: Vital Signs: There were no vitals taken for this visit.  Physical Exam He is alert and orient x3 and in no acute distress Ortho Exam Examination of his right shoulder shows pain along the biceps tendon and an anterior aspect of his shoulder.  The tendon itself appears intact but is certainly painful.  He can reach behind the right shoulder well.  He does have a slightly positive straight leg raise on the right side with pain over his ischium but not a lot of discomfort of the trochanteric area.  His hip moves smoothly. Specialty Comments:  No specialty comments available.  Imaging: XR Shoulder Right  Result Date: 12/31/2019 3 views of the right shoulder show no acute findings.  There is moderate AC joint arthritis.  The glenohumeral joint is well-maintained as is the subacromial outlet.    PMFS History: Patient Active Problem List  Diagnosis Date Noted  . S/P transmetatarsal amputation of foot, left ([place]) 04/25/2016  . Chronic osteomyelitis of toe, left ([place])   . Cellulitis of toe of left foot   . Chest pain 09/25/2015  . CAD (coronary artery disease) of artery bypass graft 09/25/2015  . Sinus bradycardia on ECG 09/25/2015  . Chronic diastolic CHF (congestive heart failure) ([place]) 09/25/2015  . Chest pain at rest 09/25/2015  . Aortic valvular disorder 08/04/2014  . Disorder of mitral valve 08/04/2014  . Diabetic foot infection ([place]) 06/03/2014  . Peripheral vascular disease ([place]) 06/03/2014  . Abscess of foot 06/03/2014  . CAD (coronary artery disease) 07/29/2013  . Trifascicular block 07/29/2013  . 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  Hypothyroidism 01/22/2007  . Type 2 diabetes mellitus with vascular disease ([place]) 01/22/2007  . HYPERLIPIDEMIA, MIXED 01/22/2007  . MYOCARDIAL INFARCTION, HX OF 01/22/2007  . DEGENERATIVE JOINT DISEASE 01/22/2007  . ANEMIA, IRON DEFICIENCY, HX OF 01/22/2007   Past Medical History:  Diagnosis Date  . Coronary artery disease    a. remote stenting to ramus, prox LAD x2.  . Dental crowns present   . Diabetes mellitus   . First degree AV block   . GERD (gastroesophageal reflux disease)   . Heart attack ([place]) 06/2004  . Heart murmur    aortic  . Hx MRSA infection 02/2006  . Hyperlipidemia   . Hypertension    hx. of - has not been on med. since losing wt. after gastric bypass  . Hypothyroidism   . Morbid obesity ([place])   . Mucoid cyst of joint 09/2011   left thumb  . Sleep apnea    sleep study 03/29/2011; no CPAP use, lost >130lbs  . Trifascicular block     Family History  Problem Relation Age of Onset  . Heart disease Mother     Past Surgical History:  Procedure Laterality Date  . AMPUTATION Left 04/19/2016   Procedure: Left Foot 4th Toe Amputation vs. Transmetatarsal;  Surgeon: [name], MD;  Location: [place];  Service: Orthopedics;  Laterality: Left;  . BIOPSY  04/07/2019   Procedure: BIOPSY;  Surgeon: Otis Brace, MD;  [facility];  Service: Gastroenterology;;  . CATARACT EXTRACTION  02/2010; 03/2010  . COLONOSCOPY WITH PROPOFOL N/A 09/14/2014   Procedure: COLONOSCOPY WITH PROPOFOL;  Surgeon: Garlan Fair, MD;  [facility];  Service: Endoscopy;  Laterality: N/A;  . COLONOSCOPY WITH PROPOFOL N/A 04/07/2019   Procedure: COLONOSCOPY WITH PROPOFOL;  Surgeon: Otis Brace, MD;  [facility];  Service: Gastroenterology;  Laterality: N/A;  . CORONARY ANGIOPLASTY WITH STENT PLACEMENT  07/12/2004; 08/03/2004   total of 3 stents  . I & D EXTREMITY Right 06/04/2014   Procedure: IRRIGATION AND DEBRIDEMENT EXTREMITY;  Surgeon: [name], MD;  Location: [place];  Service: Orthopedics;  Laterality: Right;  . MASS EXCISION  10/04/2011   Procedure: EXCISION MASS;  Surgeon: Wynonia Sours, MD;  Location: [place];  Service: Orthopedics;  Laterality: Left;  excision cyst debridment ip joint of left thumb  . PROSTATECTOMY    . ROUX-EN-Y GASTRIC BYPASS  10/10/2010   laparoscopic  . TOE AMPUTATION  02/23/2006   left foot second ray amputation  . TRIGGER FINGER RELEASE Right 09/16/2019   Procedure: RELEASE TRIGGER FINGER/A-1 PULLEY;  Surgeon: Daryll Brod, MD;  Location: [place];  Service: Orthopedics;  Laterality: Right;  IV REGIONAL FOREARM BLOCK   Social History  Occupational History  . Occupation: Engineer, maintenance (IT) taxes  Tobacco Use  . Smoking status: Never Smoker  . Smokeless tobacco: Never Used  Substance and Sexual Activity  . Alcohol use: No  . Drug use: No  . Sexual activity: Yes

## 2019-12-31 NOTE — Addendum Note (Signed)
Addended by: Robyne Peers on: 12/31/2019 11:38 AM   Modules accepted: Orders

## 2020-01-01 ENCOUNTER — Encounter: Payer: Self-pay | Admitting: Orthopaedic Surgery

## 2020-01-19 DIAGNOSIS — H0100B Unspecified blepharitis left eye, upper and lower eyelids: Secondary | ICD-10-CM | POA: Diagnosis not present

## 2020-01-19 DIAGNOSIS — H0100A Unspecified blepharitis right eye, upper and lower eyelids: Secondary | ICD-10-CM | POA: Diagnosis not present

## 2020-01-19 DIAGNOSIS — Z9841 Cataract extraction status, right eye: Secondary | ICD-10-CM | POA: Diagnosis not present

## 2020-01-19 DIAGNOSIS — Z9842 Cataract extraction status, left eye: Secondary | ICD-10-CM | POA: Diagnosis not present

## 2020-01-19 DIAGNOSIS — H04123 Dry eye syndrome of bilateral lacrimal glands: Secondary | ICD-10-CM | POA: Diagnosis not present

## 2020-01-19 DIAGNOSIS — Z79899 Other long term (current) drug therapy: Secondary | ICD-10-CM | POA: Diagnosis not present

## 2020-01-19 DIAGNOSIS — E1136 Type 2 diabetes mellitus with diabetic cataract: Secondary | ICD-10-CM | POA: Diagnosis not present

## 2020-01-19 DIAGNOSIS — Z7984 Long term (current) use of oral hypoglycemic drugs: Secondary | ICD-10-CM | POA: Diagnosis not present

## 2020-01-19 DIAGNOSIS — Z961 Presence of intraocular lens: Secondary | ICD-10-CM | POA: Diagnosis not present

## 2020-01-19 DIAGNOSIS — Z9889 Other specified postprocedural states: Secondary | ICD-10-CM | POA: Diagnosis not present

## 2020-01-28 ENCOUNTER — Ambulatory Visit: Payer: Self-pay

## 2020-01-28 ENCOUNTER — Ambulatory Visit (INDEPENDENT_AMBULATORY_CARE_PROVIDER_SITE_OTHER): Payer: Medicare Other | Admitting: Physical Medicine and Rehabilitation

## 2020-01-28 ENCOUNTER — Encounter: Payer: Self-pay | Admitting: Physical Medicine and Rehabilitation

## 2020-01-28 ENCOUNTER — Other Ambulatory Visit: Payer: Self-pay

## 2020-01-28 VITALS — BP 146/79 | HR 59

## 2020-01-28 DIAGNOSIS — M5416 Radiculopathy, lumbar region: Secondary | ICD-10-CM | POA: Diagnosis not present

## 2020-01-28 DIAGNOSIS — M25511 Pain in right shoulder: Secondary | ICD-10-CM

## 2020-01-28 DIAGNOSIS — G8929 Other chronic pain: Secondary | ICD-10-CM

## 2020-01-28 MED ORDER — BETAMETHASONE SOD PHOS & ACET 6 (3-3) MG/ML IJ SUSP
12.0000 mg | Freq: Once | INTRAMUSCULAR | Status: AC
  Administered 2020-01-28: 12 mg

## 2020-01-28 NOTE — Progress Notes (Signed)
[name] - 70 y.o. male MRN [id number]  Date of birth: [birth year]  Office Visit Note: Visit Date: 01/28/2020 PCP: [name], MD Referred by: [name], MD  Subjective: Chief Complaint  Patient presents with  . Lower Back - Pain  . Right Shoulder - Pain   HPI:  [name] is a 70 y.o. male who comes in today at the request of Dr. Jean Rosenthal for planned Left L5-S1 Lumbar epidural steroid injection with fluoroscopic guidance.  The patient has failed conservative care including home exercise, medications, time and activity modification.  This injection will be diagnostic and hopefully therapeutic.  Please see requesting physician notes for further details and justification.  Secondarily as per Dr. Trevor Mace request patient is here for planned Right glenohumeral joint injection with fluoroscopic guidance.    MRI reviewed with images and spine model.  MRI reviewed in the note below.   ROS Otherwise per HPI.  Assessment & Plan: Visit Diagnoses:    ICD-10-CM   1. Lumbar radiculopathy  M54.16 XR C-ARM NO REPORT    Epidural Steroid injection    betamethasone acetate-betamethasone sodium phosphate (CELESTONE) injection 12 mg  2. Chronic right shoulder pain  M25.511 Large Joint Inj: R glenohumeral   G89.29     Plan: No additional findings.   Meds & Orders:  Meds ordered this encounter  Medications  . betamethasone acetate-betamethasone sodium phosphate (CELESTONE) injection 12 mg    Orders Placed This Encounter  Procedures  . Large Joint Inj: R glenohumeral  . XR C-ARM NO REPORT  . Epidural Steroid injection    Follow-up: Return for visit to requesting physician as needed.   Procedures: Large Joint Inj: R glenohumeral on 01/28/2020 1:30 PM Indications: pain and diagnostic evaluation Details: 22 G 3.5 in needle, fluoroscopy-guided anteromedial approach  Arthrogram: No  Medications: 3 mL bupivacaine 0.5 %; 60 mg triamcinolone acetonide 40 MG/ML Outcome:  tolerated well, no immediate complications  There was excellent flow of contrast producing a partial arthrogram of the glenohumeral joint. The patient did have relief of symptoms during the anesthetic phase of the injection. Procedure, treatment alternatives, risks and benefits explained, specific risks discussed. Consent was given by the patient. Immediately prior to procedure a time out was called to verify the correct patient, procedure, equipment, support staff and site/side marked as required. Patient was prepped and draped in the usual sterile fashion.      Lumbar Epidural Steroid Injection - Interlaminar Approach with Fluoroscopic Guidance  Patient: [name]      Date of Birth: [birth year] MRN: [id number] PCP: [name], MD      Visit Date: 01/28/2020   Universal Protocol:     Consent Given By: the patient  Position: PRONE  Additional Comments: Vital signs were monitored before and after the procedure. Patient was prepped and draped in the usual sterile fashion. The correct patient, procedure, and site was verified.   Injection Procedure Details:   Procedure diagnoses: Lumbar radiculopathy [M54.16]   Meds Administered:  Meds ordered this encounter  Medications  . betamethasone acetate-betamethasone sodium phosphate (CELESTONE) injection 12 mg     Laterality: Left  Location/Site:  L5-S1  Needle: 3.5 in., 20 ga. Tuohy  Needle Placement: Paramedian epidural  Findings:   -Comments: Excellent flow of contrast into the epidural space.  Procedure Details: Using a paramedian approach from the side mentioned above, the region overlying the inferior lamina was localized under fluoroscopic visualization and the soft tissues overlying this structure were infiltrated with 4  ml. of 1% Lidocaine without Epinephrine. The Tuohy needle was inserted into the epidural space using a paramedian approach.   The epidural space was localized using loss of resistance along with  counter oblique bi-planar fluoroscopic views.  After negative aspirate for air, blood, and CSF, a 2 ml. volume of Isovue-250 was injected into the epidural space and the flow of contrast was observed. Radiographs were obtained for documentation purposes.    The injectate was administered into the level noted above.   Additional Comments:  The patient tolerated the procedure well Dressing: 2 x 2 sterile gauze and Band-Aid    Post-procedure details: Patient was observed during the procedure. Post-procedure instructions were reviewed.  Patient left the clinic in stable condition.     Clinical History: MRI LUMBAR SPINE WITHOUT CONTRAST  TECHNIQUE: Multiplanar, multisequence MR imaging of the lumbar spine was performed. No intravenous contrast was administered.  COMPARISON:  07/27/2012  FINDINGS: Segmentation:  Standard.  Alignment: Mild lumbar dextroscoliosis. Unchanged trace retrolisthesis of L2 on L3. New trace retrolisthesis of L3 on L4.  Vertebrae: No acute fracture. Progressive chronic degenerative endplate changes on the left at L2-3 with decreased degenerative endplate edema. New prominent edema type signal in the posteroinferior aspect of the L3 vertebral body slightly greater to the left with a possible underlying Schmorl's node or other lesion involving the inferior endplate. No suspicious marrow lesion elsewhere.  Conus medullaris and cauda equina: Conus extends to the L1 level. Conus and cauda equina appear normal.  Paraspinal and other soft tissues: Unremarkable.  Disc levels:  Disc desiccation throughout the lumbar spine. Progressive, severe disc space narrowing at L2-3 with chronic moderate narrowing at L1-2.  T12-L1: Minimal disc bulging and a shallow left paracentral disc osteophyte complex without stenosis.  L1-2: Small central disc protrusion is smaller than on the prior study. Mild disc bulging and mild facet and ligamentum  flavum hypertrophy. No significant stenosis.  L2-3: A left subarticular disc protrusion on the prior study has regressed with improved left lateral recess patency. Circumferential disc bulging eccentric to the left, endplate spurring, and mild facet and ligamentum flavum hypertrophy result in mild left greater than right lateral recess stenosis and mild left greater than right neural foraminal stenosis without significant spinal stenosis.  L3-4: Circumferential disc bulging, a right foraminal to extraforaminal disc protrusion with annular fissure, and moderate facet hypertrophy result in mild spinal stenosis, moderate right greater than left lateral recess stenosis, and moderate right and mild left neural foraminal stenosis. Lateral recess stenosis has mildly progressed.  L4-5: Circumferential disc bulging and moderate facet and ligamentum flavum hypertrophy result in moderate right and mild left lateral recess stenosis and mild-to-moderate bilateral neural foraminal stenosis, mildly progressed from prior. No significant spinal stenosis. Unchanged left foraminal annular fissure.  L5-S1: Disc bulging, endplate spurring, and mild facet hypertrophy result in moderate bilateral neural foraminal stenosis without spinal stenosis, unchanged. A left paracentral/subarticular annular fissure is unchanged.  IMPRESSION: 1. New prominent edema type signal in the posteroinferior L3 vertebral body with evidence of a small underlying lesion involving the inferior endplate. This may represent an acute Schmorl's node, however a metastasis or myeloma deposit is also possible. No other suspicious lesions in the lumbar spine. If the patient has a history of malignancy, consider nuclear medicine whole body bone scan to further evaluate for metastatic disease. 2. Mildly progressive lateral recess stenosis at L3-4 and moderate right and mild left neural foraminal stenosis. 3. Chronic severe disc  degeneration at L2-3 with interval  regression of a left subarticular disc protrusion with improved lateral recess patency. 4. Moderate right lateral recess and mild-to-moderate bilateral foraminal stenosis at L4-5, mildly progressed. 5. Unchanged moderate bilateral foraminal stenosis at L5-S1.   Electronically Signed   By: Logan Bores M.D.   On: 02/12/2019 04:32     Objective:  VS:  HT:    WT:   BMI:     BP:(!) 146/79  HR:(!) 59bpm  TEMP: ( )  RESP:  Physical Exam Vitals and nursing note reviewed.  Constitutional:      General: He is not in acute distress.    Appearance: Normal appearance. He is not ill-appearing.  HENT:     Head: Normocephalic and atraumatic.     Right Ear: External ear normal.     Left Ear: External ear normal.     Nose: No congestion.  Eyes:     Extraocular Movements: Extraocular movements intact.  Cardiovascular:     Rate and Rhythm: Normal rate.     Pulses: Normal pulses.  Pulmonary:     Effort: Pulmonary effort is normal. No respiratory distress.  Abdominal:     General: There is no distension.     Palpations: Abdomen is soft.  Musculoskeletal:        General: No tenderness or signs of injury.     Cervical back: Neck supple.     Right lower leg: No edema.     Left lower leg: No edema.     Comments: Patient has good distal strength without clonus.  Skin:    Findings: No erythema or rash.  Neurological:     General: No focal deficit present.     Mental Status: He is alert and oriented to person, place, and time.     Sensory: No sensory deficit.     Motor: No weakness or abnormal muscle tone.     Coordination: Coordination normal.  Psychiatric:        Mood and Affect: Mood normal.        Behavior: Behavior normal.      Imaging: No results found.

## 2020-01-28 NOTE — Progress Notes (Signed)
Right shoulder pain. Left buttock pain with sitting. No pain in leg. Numeric Pain Rating Scale and Functional Assessment Average Pain 6- shoulder; 7- back   In the last MONTH (on 0-10 scale) has pain interfered with the following?  1. General activity like being  able to carry out your everyday physical activities such as walking, climbing stairs, carrying groceries, or moving a chair?  Rating(4)   +Driver, -BT, -Dye Allergies.

## 2020-01-29 DIAGNOSIS — D1801 Hemangioma of skin and subcutaneous tissue: Secondary | ICD-10-CM | POA: Diagnosis not present

## 2020-01-29 DIAGNOSIS — D2372 Other benign neoplasm of skin of left lower limb, including hip: Secondary | ICD-10-CM | POA: Diagnosis not present

## 2020-01-29 DIAGNOSIS — D485 Neoplasm of uncertain behavior of skin: Secondary | ICD-10-CM | POA: Diagnosis not present

## 2020-01-29 DIAGNOSIS — D225 Melanocytic nevi of trunk: Secondary | ICD-10-CM | POA: Diagnosis not present

## 2020-01-29 DIAGNOSIS — L821 Other seborrheic keratosis: Secondary | ICD-10-CM | POA: Diagnosis not present

## 2020-01-29 DIAGNOSIS — L814 Other melanin hyperpigmentation: Secondary | ICD-10-CM | POA: Diagnosis not present

## 2020-01-29 DIAGNOSIS — D2362 Other benign neoplasm of skin of left upper limb, including shoulder: Secondary | ICD-10-CM | POA: Diagnosis not present

## 2020-01-29 DIAGNOSIS — D224 Melanocytic nevi of scalp and neck: Secondary | ICD-10-CM | POA: Diagnosis not present

## 2020-01-29 DIAGNOSIS — D692 Other nonthrombocytopenic purpura: Secondary | ICD-10-CM | POA: Diagnosis not present

## 2020-02-02 DIAGNOSIS — Z9889 Other specified postprocedural states: Secondary | ICD-10-CM | POA: Diagnosis not present

## 2020-02-02 DIAGNOSIS — Z9841 Cataract extraction status, right eye: Secondary | ICD-10-CM | POA: Diagnosis not present

## 2020-02-02 DIAGNOSIS — H04123 Dry eye syndrome of bilateral lacrimal glands: Secondary | ICD-10-CM | POA: Diagnosis not present

## 2020-02-02 DIAGNOSIS — H0100A Unspecified blepharitis right eye, upper and lower eyelids: Secondary | ICD-10-CM | POA: Diagnosis not present

## 2020-02-02 DIAGNOSIS — Z79899 Other long term (current) drug therapy: Secondary | ICD-10-CM | POA: Diagnosis not present

## 2020-02-02 DIAGNOSIS — H0100B Unspecified blepharitis left eye, upper and lower eyelids: Secondary | ICD-10-CM | POA: Diagnosis not present

## 2020-02-02 DIAGNOSIS — Z961 Presence of intraocular lens: Secondary | ICD-10-CM | POA: Diagnosis not present

## 2020-02-02 DIAGNOSIS — Z7984 Long term (current) use of oral hypoglycemic drugs: Secondary | ICD-10-CM | POA: Diagnosis not present

## 2020-02-02 DIAGNOSIS — Z9842 Cataract extraction status, left eye: Secondary | ICD-10-CM | POA: Diagnosis not present

## 2020-02-02 DIAGNOSIS — E1136 Type 2 diabetes mellitus with diabetic cataract: Secondary | ICD-10-CM | POA: Diagnosis not present

## 2020-02-11 ENCOUNTER — Ambulatory Visit: Payer: Medicare Other | Admitting: Orthopaedic Surgery

## 2020-02-16 ENCOUNTER — Encounter: Payer: Self-pay | Admitting: Orthopaedic Surgery

## 2020-02-16 ENCOUNTER — Other Ambulatory Visit: Payer: Self-pay | Admitting: Orthopaedic Surgery

## 2020-02-16 ENCOUNTER — Other Ambulatory Visit: Payer: Self-pay

## 2020-02-16 DIAGNOSIS — Z96611 Presence of right artificial shoulder joint: Secondary | ICD-10-CM

## 2020-02-16 MED ORDER — HYDROCODONE-ACETAMINOPHEN 5-325 MG PO TABS
1.0000 | ORAL_TABLET | Freq: Four times a day (QID) | ORAL | 0 refills | Status: DC | PRN
Start: 2020-02-16 — End: 2020-02-20

## 2020-02-17 ENCOUNTER — Ambulatory Visit
Admission: RE | Admit: 2020-02-17 | Discharge: 2020-02-17 | Disposition: A | Discharge status: Home / Self Care | Payer: Medicare Other | Source: Ambulatory Visit | Attending: Orthopaedic Surgery | Admitting: Orthopaedic Surgery

## 2020-02-17 DIAGNOSIS — M19011 Primary osteoarthritis, right shoulder: Secondary | ICD-10-CM | POA: Diagnosis not present

## 2020-02-17 DIAGNOSIS — Z96611 Presence of right artificial shoulder joint: Secondary | ICD-10-CM

## 2020-02-17 DIAGNOSIS — M75121 Complete rotator cuff tear or rupture of right shoulder, not specified as traumatic: Secondary | ICD-10-CM | POA: Diagnosis not present

## 2020-02-19 ENCOUNTER — Encounter: Payer: Self-pay | Admitting: Orthopaedic Surgery

## 2020-02-19 DIAGNOSIS — E1149 Type 2 diabetes mellitus with other diabetic neurological complication: Secondary | ICD-10-CM | POA: Diagnosis not present

## 2020-02-19 DIAGNOSIS — E7800 Pure hypercholesterolemia, unspecified: Secondary | ICD-10-CM | POA: Diagnosis not present

## 2020-02-19 DIAGNOSIS — Z8546 Personal history of malignant neoplasm of prostate: Secondary | ICD-10-CM | POA: Diagnosis not present

## 2020-02-19 DIAGNOSIS — E039 Hypothyroidism, unspecified: Secondary | ICD-10-CM | POA: Diagnosis not present

## 2020-02-19 DIAGNOSIS — Z1159 Encounter for screening for other viral diseases: Secondary | ICD-10-CM | POA: Diagnosis not present

## 2020-02-19 DIAGNOSIS — L708 Other acne: Secondary | ICD-10-CM | POA: Diagnosis not present

## 2020-02-19 DIAGNOSIS — K219 Gastro-esophageal reflux disease without esophagitis: Secondary | ICD-10-CM | POA: Diagnosis not present

## 2020-02-19 DIAGNOSIS — I2510 Atherosclerotic heart disease of native coronary artery without angina pectoris: Secondary | ICD-10-CM | POA: Diagnosis not present

## 2020-02-19 DIAGNOSIS — Z0000 Encounter for general adult medical examination without abnormal findings: Secondary | ICD-10-CM | POA: Diagnosis not present

## 2020-02-19 DIAGNOSIS — Z1389 Encounter for screening for other disorder: Secondary | ICD-10-CM | POA: Diagnosis not present

## 2020-02-19 DIAGNOSIS — I10 Essential (primary) hypertension: Secondary | ICD-10-CM | POA: Diagnosis not present

## 2020-02-20 ENCOUNTER — Other Ambulatory Visit: Payer: Self-pay | Admitting: Orthopaedic Surgery

## 2020-02-20 MED ORDER — HYDROCODONE-ACETAMINOPHEN 5-325 MG PO TABS
1.0000 | ORAL_TABLET | Freq: Four times a day (QID) | ORAL | 0 refills | Status: DC | PRN
Start: 2020-02-20 — End: 2020-03-27

## 2020-02-26 ENCOUNTER — Ambulatory Visit (INDEPENDENT_AMBULATORY_CARE_PROVIDER_SITE_OTHER): Payer: Medicare Other | Admitting: Orthopaedic Surgery

## 2020-02-26 ENCOUNTER — Encounter: Payer: Self-pay | Admitting: Orthopaedic Surgery

## 2020-02-26 DIAGNOSIS — M75121 Complete rotator cuff tear or rupture of right shoulder, not specified as traumatic: Secondary | ICD-10-CM | POA: Diagnosis not present

## 2020-02-26 MED ORDER — TIZANIDINE HCL 4 MG PO TABS: 4.0000 mg | ORAL_TABLET | Freq: Three times a day (TID) | ORAL | 1 refills | Status: DC | PRN

## 2020-02-26 MED ORDER — NABUMETONE 750 MG PO TABS: 750.0000 mg | ORAL_TABLET | Freq: Two times a day (BID) | ORAL | 1 refills | Status: DC | PRN

## 2020-02-26 NOTE — Progress Notes (Signed)
The patient is a 70 year old gentleman that I am seeing in follow-up today after having a MRI of his right shoulder.  He was having a lot of pain and weakness with that shoulder.  We have tried conservative treatment including activity modification and strengthening as well as a steroid injection.  His pain was getting enough worse for him and the weakness were still a MRI was medically warranted at this standpoint.  The MRI is shown to him of his right shoulder.  There is a full-thickness rotator cuff tear of the supraspinatus tendon but no retraction that is significant at all.  There is no muscle atrophy showing this is more of an acute tear.  He does have moderate AC joint arthritis.  There are some subdeltoid and subacromial fluid as relates to the full-thickness rotator cuff tear of his right shoulder.  On examination of the right shoulder there is weakness with abduction and pain past 90 degrees of abduction.  I did recommend arthroscopic intervention for this right shoulder given his continued pain and weakness.  I showed him a shoulder model and explained in detail what arthroscopic rotator cuff surgery involves.  He understands that his age of 75 that is collagen fibers or bone may not allow for repair but certainly debriding the tissue and performing subacromial decompression as well as debriding the [employer] joint will be helpful.  I do feel that he likely has better tissue quality and bone quality that we will allow for rotator cuff repair.  I then described what the interoperative and postoperative course involves as well as the risk and notes of surgery in detail.  He is a diabetic but has had pretty good control of his diabetes.  All question concerns were answered and addressed.  He is interested in surgery.  He has been on hydrocodone for pain.  I will send in some Relafen and Zanaflex as well.  We will work on getting surgery scheduled and then will see him back in 1 week postoperative.

## 2020-03-01 ENCOUNTER — Encounter: Payer: Self-pay | Admitting: Orthopaedic Surgery

## 2020-03-08 ENCOUNTER — Ambulatory Visit: Payer: Medicare Other | Admitting: Orthopaedic Surgery

## 2020-03-09 ENCOUNTER — Other Ambulatory Visit: Payer: Self-pay

## 2020-03-10 MED ORDER — TRIAMCINOLONE ACETONIDE 40 MG/ML IJ SUSP
60.0000 mg | INTRAMUSCULAR | Status: AC | PRN
  Administered 2020-01-28: 60 mg via INTRA_ARTICULAR

## 2020-03-10 MED ORDER — BUPIVACAINE HCL 0.5 % IJ SOLN
3.0000 mL | INTRAMUSCULAR | Status: AC | PRN
  Administered 2020-01-28: 3 mL via INTRA_ARTICULAR

## 2020-03-10 NOTE — Procedures (Signed)
Lumbar Epidural Steroid Injection - Interlaminar Approach with Fluoroscopic Guidance  Patient: [name]      Date of Birth: 1950/08/18 MRN: [id number] PCP: [name], MD      Visit Date: 01/28/2020   Universal Protocol:     Consent Given By: the patient  Position: PRONE  Additional Comments: Vital signs were monitored before and after the procedure. Patient was prepped and draped in the usual sterile fashion. The correct patient, procedure, and site was verified.   Injection Procedure Details:   Procedure diagnoses: Lumbar radiculopathy [M54.16]   Meds Administered:  Meds ordered this encounter  Medications  . betamethasone acetate-betamethasone sodium phosphate (CELESTONE) injection 12 mg     Laterality: Left  Location/Site:  L5-S1  Needle: 3.5 in., 20 ga. Tuohy  Needle Placement: Paramedian epidural  Findings:   -Comments: Excellent flow of contrast into the epidural space.  Procedure Details: Using a paramedian approach from the side mentioned above, the region overlying the inferior lamina was localized under fluoroscopic visualization and the soft tissues overlying this structure were infiltrated with 4 ml. of 1% Lidocaine without Epinephrine. The Tuohy needle was inserted into the epidural space using a paramedian approach.   The epidural space was localized using loss of resistance along with counter oblique bi-planar fluoroscopic views.  After negative aspirate for air, blood, and CSF, a 2 ml. volume of Isovue-250 was injected into the epidural space and the flow of contrast was observed. Radiographs were obtained for documentation purposes.    The injectate was administered into the level noted above.   Additional Comments:  The patient tolerated the procedure well Dressing: 2 x 2 sterile gauze and Band-Aid    Post-procedure details: Patient was observed during the procedure. Post-procedure instructions were reviewed.  Patient left the clinic in  stable condition.

## 2020-03-20 ENCOUNTER — Other Ambulatory Visit (HOSPITAL_COMMUNITY)
Admission: RE | Admit: 2020-03-20 | Discharge: 2020-03-20 | Disposition: A | Discharge status: Home / Self Care | Payer: Medicare Other | Source: Ambulatory Visit | Attending: Orthopaedic Surgery | Admitting: Orthopaedic Surgery

## 2020-03-20 DIAGNOSIS — Z20822 Contact with and (suspected) exposure to covid-19: Secondary | ICD-10-CM | POA: Insufficient documentation

## 2020-03-20 DIAGNOSIS — Z01812 Encounter for preprocedural laboratory examination: Secondary | ICD-10-CM | POA: Insufficient documentation

## 2020-03-20 LAB — SARS CORONAVIRUS 2 (TAT 6-24 HRS): SARS Coronavirus 2: NEGATIVE

## 2020-03-22 ENCOUNTER — Encounter (HOSPITAL_COMMUNITY): Payer: Self-pay | Admitting: Orthopaedic Surgery

## 2020-03-22 ENCOUNTER — Other Ambulatory Visit: Payer: Self-pay | Admitting: Physician Assistant

## 2020-03-22 NOTE — Progress Notes (Signed)
Anesthesia Chart Review:  Case: [id number] Date/Time: 03/23/20 1350   Procedure: RIGHT SHOULDER ARTHROSCOPY WITH ROTATOR CUFF REPAIR (Right Shoulder)   Anesthesia type: General   Pre-op diagnosis: right shoulder rotator cuff full thickness tear   Location: [facility] OR ROOM 05 / [place] [facility]   Surgeons: [name], MD      DISCUSSION: Patient is a 70 year old male scheduled for the above procedure.  History includes never smoker, HLD, HTN, aortic murmur (mild AS/AI 2018), CAD (NSTEMI, s/p DES proximal Ramus & DES proximal LAD 07/12/04, DES distal RCA 08/03/04), trifascicular block (1st degree, RBBB, LAFB), DM2, hypothyroidism, GERD, iron deficiency anemia, morbid obesity (s/p Roux-en-Y gastric bypass 10/10/10), (OSA (no CPAP, lost > 130 lb), dental crowns, osteomyelitis (left foot 2nd ray amputation 02/23/06; s/p left transmetatarsal amputation 04/19/16; right BKA 09/27/16 GETA, [facility]), prostate cancer (s/p prostatectomy 12/29/14, GETA, [facility]). S/p release A-1 pulley right middle finger 09/16/19.   Last evaluation by cardiology was 12/22/19 by Dr. Marlou Porch.  Patient was doing well from a CAD standpoint.  Lisinopril was decreased due to some occasional positional lightheadedness. No high risk symptoms related to bifascicular block--continue to monitor although may need PPM in the future. (He had been seen on 09/12/19 by [name], PA for preoperative clearance for hand surgery which was done on 09/16/19. He notes patient with history of intra-operative bradycardia, one that occurred during 10/10/10 Roux-en-Y where it looks like HR briefly dipped into the 30's. He has tolerated several procedure since including some under regional anesthesia and others under GETA.) 12 months follow-up planned.  He is a same day work-up, so labs on arrival. A1c 7.2% and Cr 1.01 on 02/19/20 [facility]).  [facility] notes document 3rd Moderna COVID-19 vaccine as 11/16/19. 03/20/20 presurgical COVID-19 test negative. Anesthesia team to evaluate on the  day of surgery.    VS:  BP Readings from Last 3 Encounters:  01/28/20 (!) 146/79  12/22/19 124/76  09/16/19 (!) 157/65   Pulse Readings from Last 3 Encounters:  01/28/20 (!) 59  12/22/19 63  09/16/19 (!) 48    PROVIDERS: [name], MD is PCP  Candee Furbish, MD is cardiologist   LABS: For day of surgery. He had labs on 02/19/20 through [facility] (see Care Everywhere) that show an A1c 7.2%, glucose 163, creatinine 1.01, sodium 134, potassium 4.4, alkaline phosphatase 61, AST 17, ALT 18.   IMAGES: MRI Right shoulder 125/22: IMPRESSION: 1. Focal full-thickness tear of the superior subscapularis/anterior supraspinatus tendon. There is resultant small amount of fluid in the subacromial-subdeltoid bursa 2. Partial low-grade tear of the infraspinatus tendon 3. Subscapularis, supraspinatus, and infraspinatus tendinosis 4. Mild glenohumeral joint chondral disease   EKG: EKG 09/12/19 ([facility]): Sinus bradycardia at 54 bpm First-degree AV block Right bundle branch block Left anterior fascicular block  Bifascicular block Septal infarct, age undetermined Lateral infarct, age undetermined   CV: Echo 04/19/16: Study Conclusions  - Left ventricle: The cavity size was normal. Wall thickness was  increased in a pattern of moderate LVH. Systolic function was  mildly reduced. The estimated ejection fraction was in the range  of 45% to 50%. Inferior hypokinesis. Doppler parameters are  consistent with abnormal left ventricular relaxation (grade 1  diastolic dysfunction). The E/e&' ratio is between 8-15,  suggesting indeterminate LV filling pressure.  - Aortic valve: Trileaflet; mildly calcified leaflets. Mild  stenosis. There was mild regurgitation. Mean gradient (S): 14 mm  Hg. Peak gradient (S): 27 mm Hg.  - Aorta: Aortic root dimension: 42 mm (ED).  -  Aortic root: The aortic root is mildly dilated.  - Mitral valve: Mildly thickened leaflets . There was  mild  regurgitation.  - Left atrium: The atrium was normal in size.  - Right ventricle: Systolic function is mildly reduced.  - Right atrium: The atrium was normal in size.  - Inferior vena cava: The vessel was normal in size. The  respirophasic diameter changes were in the normal range (>= 50%),  consistent with normal central venous pressure.  Impressions:  - Compared to a prior study in 2017, the LVEF is slightly worse at  45-50%. There is a suggestion of inferior wall hypokinesis.    Nuclear stress test 09/26/15: IMPRESSION: 1. No reversible ischemia or infarction. 2. Mild global hypokinesis.  No focal wall motion abnormality. 3. Left ventricular ejection fraction 46% 4. Non invasive risk stratification: Intermediate, due to low ejection fraction.   Cardiac cath/PCI 07/12/04:  FINAL IMPRESSION: 1.  Atherosclerotic cardiovascular disease, three-vessel. 2.  Status post non-ST segment elevation myocardial infarction in the distribution of the ramus intermedius. 3.  Intact global left ventricular size and systolic function with regional wall motion abnormalities noted. Ejection fraction 55-60%. 4.  Status post successful PCI/drug-eluting stent implantation to proximal ramus intermedius. 5.  Status post successful PCI/drug-eluting stent implantation to proximal anterior descending artery. PCI 08/03/04: Status post successful percutaneous coronary intervention/drug-eluting stent implantation distal right coronary.   Past Medical History:  Diagnosis Date  . Anemia    low iron  . Arthritis   . Coronary artery disease 2006   a. remote stenting to ramus, prox LAD x2.  . Dental crowns present   . Diabetes mellitus   . First degree AV block   . GERD (gastroesophageal reflux disease)   . Heart attack ([place]) 06/2004  . Heart murmur    aortic  . Hx MRSA infection 02/2006  . Hyperlipidemia   . Hypertension    hx. of - has not been on med. since losing wt. after gastric bypass   . Hypothyroidism   . Morbid obesity ([place])   . Mucoid cyst of joint 09/2011   left thumb  . Sleep apnea    sleep study 03/29/2011; no CPAP use, lost >130lbs  . Trifascicular block     Past Surgical History:  Procedure Laterality Date  . AMPUTATION Left 04/19/2016   Procedure: Left Foot 4th Toe Amputation vs. Transmetatarsal;  Surgeon: [name], MD;  Location: [place];  Service: Orthopedics;  Laterality: Left;  . BIOPSY  04/07/2019   Procedure: BIOPSY;  Surgeon: Otis Brace, MD;  Location: [facility];  Service: Gastroenterology;;  . CATARACT EXTRACTION  02/2010; 03/2010  . COLONOSCOPY WITH PROPOFOL N/A 09/14/2014   Procedure: COLONOSCOPY WITH PROPOFOL;  Surgeon: Garlan Fair, MD;  Location: [facility];  Service: Endoscopy;  Laterality: N/A;  . COLONOSCOPY WITH PROPOFOL N/A 04/07/2019   Procedure: COLONOSCOPY WITH PROPOFOL;  Surgeon: Otis Brace, MD;  Location: [facility];  Service: Gastroenterology;  Laterality: N/A;  . CORONARY ANGIOPLASTY WITH STENT PLACEMENT  07/12/2004; 08/03/2004   total of 3 stents  . I & D EXTREMITY Right 06/04/2014   Procedure: IRRIGATION AND DEBRIDEMENT EXTREMITY;  Surgeon: [name], MD;  Location: [place];  Service: Orthopedics;  Laterality: Right;  . MASS EXCISION  10/04/2011   Procedure: EXCISION MASS;  Surgeon: Wynonia Sours, MD;  Location: [place];  Service: Orthopedics;  Laterality: Left;  excision cyst debridment ip joint of left thumb  . PROSTATECTOMY    .  right below knee amputation  2018  . ROUX-EN-Y GASTRIC BYPASS  10/10/2010   laparoscopic  . TOE AMPUTATION  02/23/2006   left foot second ray amputation  . TRANSMETATARSAL AMPUTATION Left   . TRIGGER FINGER RELEASE Right 09/16/2019   Procedure: RELEASE TRIGGER FINGER/A-1 PULLEY;  Surgeon: Daryll Brod, MD;  Location: [place];  Service: Orthopedics;  Laterality: Right;  IV REGIONAL FOREARM BLOCK    MEDICATIONS: No current  facility-administered medications for this encounter.   Marland Kitchen aspirin EC 81 MG tablet  . atorvastatin (LIPITOR) 10 MG tablet  . CALCIUM CITRATE PO  . Cyanocobalamin (B-12) 1000 MCG SUBL  . ferrous sulfate 325 (65 FE) MG tablet  . fluticasone (FLONASE) 50 MCG/ACT nasal spray  . HYDROcodone-acetaminophen (NORCO/VICODIN) 5-325 MG tablet  . Hypromellose (GENTEAL SEVERE OP)  . ibuprofen (ADVIL,MOTRIN) 200 MG tablet  . levothyroxine (SYNTHROID) 150 MCG tablet  . lisinopril (ZESTRIL) 2.5 MG tablet  . Melatonin 10 MG TABS  . metFORMIN (GLUCOPHAGE-XR) 500 MG 24 hr tablet  . Multiple Vitamin (MULTIVITAMIN WITH MINERALS) TABS tablet  . Multiple Vitamins-Minerals (PRESERVISION AREDS 2) CAPS  . nabumetone (RELAFEN) 750 MG tablet  . nitroGLYCERIN (NITROSTAT) 0.4 MG SL tablet  . Omega-3 Fatty Acids (FISH OIL TRIPLE STRENGTH PO)  . Omeprazole 20 MG TBEC  . OVER THE COUNTER MEDICATION  . OVER THE COUNTER MEDICATION  . Probiotic Product (PROBIOTIC PO)  . sildenafil (REVATIO) 20 MG tablet  . tiZANidine (ZANAFLEX) 4 MG tablet    Myra Gianotti, PA-C Surgical Short Stay/Anesthesiology [facility] Phone [phone] [facility] Phone [phone] 03/22/2020 1:28 PM

## 2020-03-22 NOTE — Progress Notes (Signed)
Spoke with pt for pre-op call. Pt has hx of CAD with stent in 2006. Pt has been told that he will probably need to have a pacemaker in the future. Pt denies any recent chest pain or shortness of breath. Pt's cardiologist is Dr. Gillian Shields, last office visit was 12/22/19. Pt is a type 2 diabetic. Last A1C was 7.3 in January at Dr. Cindee Salt office. Pt states his fasting blood sugar is usually between 132-160. Instructed pt not to take his Metformin in the AM. Instructed him to check his blood sugar when he gets up and every 2 hour until he leaves for the hospital. If blood sugar is 70 or below, treat with 1/2 cup of clear juice (apple or cranberry) and recheck blood sugar 15 minutes after drinking juice. If blood sugar continues to be 70 or below, call the Short Stay department and ask to speak to a nurse. Pt voiced understanding.  Covid test done 03/20/20 and it's negative.  Pt states he's been in quarantine since the test was done and understands that he stays in quarantine until he comes to the hospital tomorrow.   Chart sent to Anesthesia PA for review.

## 2020-03-22 NOTE — Anesthesia Preprocedure Evaluation (Addendum)
Anesthesia Evaluation  Patient identified by MRN, date of birth, ID band Patient awake    Reviewed: Allergy & Precautions, NPO status , Patient's Chart, lab work & pertinent test results  History of Anesthesia Complications Negative for: history of anesthetic complications  Airway Mallampati: II  TM Distance: >3 FB Neck ROM: Full    Dental no notable dental hx.    Pulmonary neg pulmonary ROS,    Pulmonary exam normal        Cardiovascular hypertension, Pt. on medications + CAD, + Past MI, + Cardiac Stents (2006), + Peripheral Vascular Disease and +CHF  Normal cardiovascular exam+ Valvular Problems/Murmurs   Echo 04/19/16: moderate LVH, EF 45-50%, inferior hypokinesis, grade 1 DD, mild AS/AR, mild MR, RV systolic function is mildly reduced    Neuro/Psych negative neurological ROS  negative psych ROS   GI/Hepatic Neg liver ROS, GERD  Medicated and Controlled,  Endo/Other  diabetes, Type 2, Oral Hypoglycemic AgentsHypothyroidism   Renal/GU negative Renal ROS  negative genitourinary   Musculoskeletal  (+) Arthritis ,   Abdominal   Peds  Hematology negative hematology ROS (+)   Anesthesia Other Findings Day of surgery medications reviewed with patient.  Reproductive/Obstetrics negative OB ROS                          Anesthesia Physical Anesthesia Plan  ASA: III  Anesthesia Plan: General   Post-op Pain Management: GA combined w/ Regional for post-op pain   Induction: Intravenous  PONV Risk Score and Plan: Treatment may vary due to age or medical condition, Ondansetron and Dexamethasone  Airway Management Planned: Oral ETT  Additional Equipment: None  Intra-op Plan:   Post-operative Plan: Extubation in OR  Informed Consent: I have reviewed the patients History and Physical, chart, labs and discussed the procedure including the risks, benefits and alternatives for the proposed  anesthesia with the patient or authorized representative who has indicated his/her understanding and acceptance.     Dental advisory given  Plan Discussed with: CRNA  Anesthesia Plan Comments: (PAT note written 03/22/2020 by Myra Gianotti, PA-C. SAME DAY WORK-UP  Known first degree AV block, LAFB, RBBB. 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 Cardiologist is Dr. Marlou Porch.  )      Anesthesia Quick Evaluation

## 2020-03-23 ENCOUNTER — Ambulatory Visit (HOSPITAL_COMMUNITY): Payer: Medicare Other | Admitting: Vascular Surgery

## 2020-03-23 ENCOUNTER — Other Ambulatory Visit: Payer: Self-pay

## 2020-03-23 ENCOUNTER — Ambulatory Visit (HOSPITAL_COMMUNITY)
Admission: RE | Admit: 2020-03-23 | Discharge: 2020-03-23 | Disposition: A | Discharge status: Home / Self Care | Payer: Medicare Other | Attending: Orthopaedic Surgery | Admitting: Orthopaedic Surgery

## 2020-03-23 ENCOUNTER — Encounter (HOSPITAL_COMMUNITY)
Admission: RE | Disposition: A | Discharge status: Home / Self Care | Payer: Self-pay | Source: Home / Self Care | Attending: Orthopaedic Surgery

## 2020-03-23 ENCOUNTER — Encounter (HOSPITAL_COMMUNITY): Payer: Self-pay | Admitting: Orthopaedic Surgery

## 2020-03-23 DIAGNOSIS — I10 Essential (primary) hypertension: Secondary | ICD-10-CM | POA: Diagnosis not present

## 2020-03-23 DIAGNOSIS — Z8614 Personal history of Methicillin resistant Staphylococcus aureus infection: Secondary | ICD-10-CM | POA: Insufficient documentation

## 2020-03-23 DIAGNOSIS — Z955 Presence of coronary angioplasty implant and graft: Secondary | ICD-10-CM | POA: Diagnosis not present

## 2020-03-23 DIAGNOSIS — Z880 Allergy status to penicillin: Secondary | ICD-10-CM | POA: Diagnosis not present

## 2020-03-23 DIAGNOSIS — Z7984 Long term (current) use of oral hypoglycemic drugs: Secondary | ICD-10-CM | POA: Diagnosis not present

## 2020-03-23 DIAGNOSIS — I5032 Chronic diastolic (congestive) heart failure: Secondary | ICD-10-CM | POA: Diagnosis not present

## 2020-03-23 DIAGNOSIS — Z89511 Acquired absence of right leg below knee: Secondary | ICD-10-CM | POA: Insufficient documentation

## 2020-03-23 DIAGNOSIS — Z79890 Hormone replacement therapy (postmenopausal): Secondary | ICD-10-CM | POA: Insufficient documentation

## 2020-03-23 DIAGNOSIS — M75121 Complete rotator cuff tear or rupture of right shoulder, not specified as traumatic: Secondary | ICD-10-CM | POA: Diagnosis not present

## 2020-03-23 DIAGNOSIS — I110 Hypertensive heart disease with heart failure: Secondary | ICD-10-CM | POA: Diagnosis not present

## 2020-03-23 DIAGNOSIS — E1159 Type 2 diabetes mellitus with other circulatory complications: Secondary | ICD-10-CM | POA: Diagnosis not present

## 2020-03-23 DIAGNOSIS — Z881 Allergy status to other antibiotic agents status: Secondary | ICD-10-CM | POA: Diagnosis not present

## 2020-03-23 DIAGNOSIS — I2510 Atherosclerotic heart disease of native coronary artery without angina pectoris: Secondary | ICD-10-CM | POA: Insufficient documentation

## 2020-03-23 DIAGNOSIS — G8918 Other acute postprocedural pain: Secondary | ICD-10-CM | POA: Diagnosis not present

## 2020-03-23 DIAGNOSIS — Z882 Allergy status to sulfonamides status: Secondary | ICD-10-CM | POA: Diagnosis not present

## 2020-03-23 DIAGNOSIS — Z791 Long term (current) use of non-steroidal anti-inflammatories (NSAID): Secondary | ICD-10-CM | POA: Diagnosis not present

## 2020-03-23 DIAGNOSIS — E119 Type 2 diabetes mellitus without complications: Secondary | ICD-10-CM | POA: Insufficient documentation

## 2020-03-23 DIAGNOSIS — Z7982 Long term (current) use of aspirin: Secondary | ICD-10-CM | POA: Insufficient documentation

## 2020-03-23 DIAGNOSIS — Z9884 Bariatric surgery status: Secondary | ICD-10-CM | POA: Diagnosis not present

## 2020-03-23 DIAGNOSIS — I25118 Atherosclerotic heart disease of native coronary artery with other forms of angina pectoris: Secondary | ICD-10-CM | POA: Diagnosis not present

## 2020-03-23 DIAGNOSIS — Z79899 Other long term (current) drug therapy: Secondary | ICD-10-CM | POA: Insufficient documentation

## 2020-03-23 HISTORY — DX: Unspecified osteoarthritis, unspecified site: M19.90

## 2020-03-23 HISTORY — DX: Anemia, unspecified: D64.9

## 2020-03-23 HISTORY — PX: SHOULDER ARTHROSCOPY WITH ROTATOR CUFF REPAIR AND SUBACROMIAL DECOMPRESSION: SHX5686

## 2020-03-23 LAB — BASIC METABOLIC PANEL
Anion gap: 16 — ABNORMAL HIGH (ref 5–15)
BUN: 11 mg/dL (ref 8–23)
CO2: 19 mmol/L — ABNORMAL LOW (ref 22–32)
Calcium: 9.1 mg/dL (ref 8.9–10.3)
Chloride: 107 mmol/L (ref 98–111)
Creatinine, Ser: 0.96 mg/dL (ref 0.61–1.24)
GFR, Estimated: >60 mL/min (ref >60)
Glucose, Bld: 180 mg/dL — ABNORMAL HIGH (ref 70–99)
Potassium: 3.9 mmol/L (ref 3.5–5.1)
Sodium: 142 mmol/L (ref 135–145)

## 2020-03-23 LAB — CBC
HCT: 46 % (ref 39.0–52.0)
Hemoglobin: 15.3 g/dL (ref 13.0–17.0)
MCH: 30.5 pg (ref 26.0–34.0)
MCHC: 33.3 g/dL (ref 30.0–36.0)
MCV: 91.8 fL (ref 80.0–100.0)
Platelets: 188 10*3/uL (ref 150–400)
RBC: 5.01 MIL/uL (ref 4.22–5.81)
RDW: 13.4 % (ref 11.5–15.5)
WBC: 6.9 10*3/uL (ref 4.0–10.5)
nRBC: 0 % (ref 0.0–0.2)

## 2020-03-23 LAB — GLUCOSE, CAPILLARY
Glucose-Capillary: 189 mg/dL — ABNORMAL HIGH (ref 70–99)
Glucose-Capillary: 162 mg/dL — ABNORMAL HIGH (ref 70–99)
Glucose-Capillary: 152 mg/dL — ABNORMAL HIGH (ref 70–99)

## 2020-03-23 SURGERY — SHOULDER ARTHROSCOPY WITH ROTATOR CUFF REPAIR AND SUBACROMIAL DECOMPRESSION
Anesthesia: General | Site: Shoulder | Laterality: Right

## 2020-03-23 MED ORDER — 0.9 % SODIUM CHLORIDE (POUR BTL) OPTIME
TOPICAL | Status: DC | PRN
  Administered 2020-03-23: 1000 mL

## 2020-03-23 MED ORDER — OXYCODONE HCL 5 MG PO TABS: 5.0000 mg | ORAL_TABLET | Freq: Four times a day (QID) | ORAL | 0 refills | Status: DC | PRN

## 2020-03-23 MED ORDER — ROCURONIUM BROMIDE 10 MG/ML (PF) SYRINGE
PREFILLED_SYRINGE | INTRAVENOUS | Status: AC
  Filled 2020-03-23: qty 10

## 2020-03-23 MED ORDER — ONDANSETRON HCL 4 MG/2ML IJ SOLN
INTRAMUSCULAR | Status: AC
  Filled 2020-03-23: qty 2

## 2020-03-23 MED ORDER — EPHEDRINE SULFATE-NACL 50-0.9 MG/10ML-% IV SOSY
PREFILLED_SYRINGE | INTRAVENOUS | Status: DC | PRN
  Administered 2020-03-23 (×3): 5 mg via INTRAVENOUS

## 2020-03-23 MED ORDER — PHENYLEPHRINE 40 MCG/ML (10ML) SYRINGE FOR IV PUSH (FOR BLOOD PRESSURE SUPPORT)
PREFILLED_SYRINGE | INTRAVENOUS | Status: DC | PRN
  Administered 2020-03-23: 80 ug via INTRAVENOUS

## 2020-03-23 MED ORDER — BUPIVACAINE-EPINEPHRINE (PF) 0.25% -1:200000 IJ SOLN
INTRAMUSCULAR | Status: AC
  Filled 2020-03-23: qty 30

## 2020-03-23 MED ORDER — LIDOCAINE 2% (20 MG/ML) 5 ML SYRINGE
INTRAMUSCULAR | Status: AC
  Filled 2020-03-23: qty 5

## 2020-03-23 MED ORDER — PROPOFOL 10 MG/ML IV BOLUS
INTRAVENOUS | Status: DC | PRN
  Administered 2020-03-23: 30 mg via INTRAVENOUS
  Administered 2020-03-23: 20 mg via INTRAVENOUS
  Administered 2020-03-23: 200 mg via INTRAVENOUS

## 2020-03-23 MED ORDER — FENTANYL CITRATE (PF) 100 MCG/2ML IJ SOLN
25.0000 ug | INTRAMUSCULAR | Status: DC | PRN
Start: 2020-03-23 — End: 2020-03-24

## 2020-03-23 MED ORDER — LACTATED RINGERS IV SOLN: INTRAVENOUS | Status: DC

## 2020-03-23 MED ORDER — PROMETHAZINE HCL 25 MG/ML IJ SOLN: 6.2500 mg | INTRAMUSCULAR | Status: DC | PRN

## 2020-03-23 MED ORDER — CHLORHEXIDINE GLUCONATE 0.12 % MT SOLN: 15.0000 mL | Freq: Once | OROMUCOSAL | Status: AC

## 2020-03-23 MED ORDER — ROCURONIUM BROMIDE 100 MG/10ML IV SOLN
INTRAVENOUS | Status: DC | PRN
  Administered 2020-03-23: 60 mg via INTRAVENOUS
  Administered 2020-03-23: 30 mg via INTRAVENOUS

## 2020-03-23 MED ORDER — FENTANYL CITRATE (PF) 250 MCG/5ML IJ SOLN
INTRAMUSCULAR | Status: DC | PRN
  Administered 2020-03-23: 100 ug via INTRAVENOUS

## 2020-03-23 MED ORDER — FENTANYL CITRATE (PF) 250 MCG/5ML IJ SOLN
INTRAMUSCULAR | Status: AC
  Filled 2020-03-23: qty 5

## 2020-03-23 MED ORDER — EPINEPHRINE PF 1 MG/ML IJ SOLN
INTRAMUSCULAR | Status: AC
  Filled 2020-03-23: qty 2

## 2020-03-23 MED ORDER — MIDAZOLAM HCL 2 MG/2ML IJ SOLN
INTRAMUSCULAR | Status: AC
  Administered 2020-03-23: 1 mg
  Filled 2020-03-23: qty 2

## 2020-03-23 MED ORDER — LIDOCAINE 2% (20 MG/ML) 5 ML SYRINGE
INTRAMUSCULAR | Status: DC | PRN
  Administered 2020-03-23: 100 mg via INTRAVENOUS

## 2020-03-23 MED ORDER — ACETAMINOPHEN 500 MG PO TABS
1000.0000 mg | ORAL_TABLET | Freq: Once | ORAL | Status: AC
  Administered 2020-03-23: 1000 mg via ORAL
  Filled 2020-03-23: qty 2

## 2020-03-23 MED ORDER — KETAMINE HCL 50 MG/5ML IJ SOSY
PREFILLED_SYRINGE | INTRAMUSCULAR | Status: AC
  Filled 2020-03-23: qty 5

## 2020-03-23 MED ORDER — OXYCODONE HCL 5 MG/5ML PO SOLN: 5.0000 mg | Freq: Once | ORAL | Status: DC | PRN

## 2020-03-23 MED ORDER — OXYCODONE HCL 5 MG PO TABS: 5.0000 mg | ORAL_TABLET | Freq: Once | ORAL | Status: DC | PRN

## 2020-03-23 MED ORDER — CLINDAMYCIN PHOSPHATE 900 MG/50ML IV SOLN
900.0000 mg | INTRAVENOUS | Status: AC
  Administered 2020-03-23: 900 mg via INTRAVENOUS
  Filled 2020-03-23: qty 50

## 2020-03-23 MED ORDER — BUPIVACAINE-EPINEPHRINE (PF) 0.5% -1:200000 IJ SOLN
INTRAMUSCULAR | Status: DC | PRN
  Administered 2020-03-23: 15 mL via PERINEURAL

## 2020-03-23 MED ORDER — SUGAMMADEX SODIUM 200 MG/2ML IV SOLN
INTRAVENOUS | Status: DC | PRN
  Administered 2020-03-23 (×2): 100 mg via INTRAVENOUS

## 2020-03-23 MED ORDER — CHLORHEXIDINE GLUCONATE 0.12 % MT SOLN
OROMUCOSAL | Status: AC
  Administered 2020-03-23: 15 mL via OROMUCOSAL
  Filled 2020-03-23: qty 15

## 2020-03-23 MED ORDER — FENTANYL CITRATE (PF) 100 MCG/2ML IJ SOLN
INTRAMUSCULAR | Status: AC
  Administered 2020-03-23: 50 ug
  Filled 2020-03-23: qty 2

## 2020-03-23 MED ORDER — PROPOFOL 10 MG/ML IV BOLUS
INTRAVENOUS | Status: AC
  Filled 2020-03-23: qty 40

## 2020-03-23 MED ORDER — BUPIVACAINE LIPOSOME 1.3 % IJ SUSP
INTRAMUSCULAR | Status: DC | PRN
  Administered 2020-03-23: 10 mL via PERINEURAL

## 2020-03-23 MED ORDER — FENTANYL CITRATE (PF) 100 MCG/2ML IJ SOLN: 50.0000 ug | Freq: Once | INTRAMUSCULAR | Status: DC

## 2020-03-23 MED ORDER — ORAL CARE MOUTH RINSE: 15.0000 mL | Freq: Once | OROMUCOSAL | Status: AC

## 2020-03-23 MED ORDER — MIDAZOLAM HCL 2 MG/2ML IJ SOLN
INTRAMUSCULAR | Status: AC
  Filled 2020-03-23: qty 2

## 2020-03-23 MED ORDER — MIDAZOLAM HCL 2 MG/2ML IJ SOLN: 1.0000 mg | Freq: Once | INTRAMUSCULAR | Status: DC

## 2020-03-23 MED ORDER — SODIUM CHLORIDE 0.9 % IR SOLN
Status: DC | PRN
  Administered 2020-03-23 (×3): 3000 mL

## 2020-03-23 SURGICAL SUPPLY — 56 items
BLADE CLIPPER SURG (BLADE) IMPLANT
BLADE EXCALIBUR 4.0X13 (MISCELLANEOUS) ×2 IMPLANT
BLADE SURG 11 STRL SS (BLADE) ×2 IMPLANT
BURR OVAL 8 FLU 4.0X13 (MISCELLANEOUS) ×2 IMPLANT
CANNULA 5.75X7 CRYSTAL CLEAR (CANNULA) IMPLANT
CANNULA SHOULDER 7CM (CANNULA) ×4 IMPLANT
CANNULA TWIST IN 8.25X7CM (CANNULA) ×2 IMPLANT
COVER SURGICAL LIGHT HANDLE (MISCELLANEOUS) ×2 IMPLANT
COVER WAND RF STERILE (DRAPES) IMPLANT
DECANTER SPIKE VIAL GLASS SM (MISCELLANEOUS) IMPLANT
DRAPE INCISE IOBAN 66X45 STRL (DRAPES) IMPLANT
DRAPE SHOULDER BEACH CHAIR (DRAPES) ×2 IMPLANT
DRAPE SURG 17X23 STRL (DRAPES) ×2 IMPLANT
DRAPE U-SHAPE 47X51 STRL (DRAPES) ×2 IMPLANT
DRSG PAD ABDOMINAL 8X10 ST (GAUZE/BANDAGES/DRESSINGS) ×6 IMPLANT
DRSG XEROFORM 1X8 (GAUZE/BANDAGES/DRESSINGS) ×2 IMPLANT
DURAPREP 26ML APPLICATOR (WOUND CARE) ×2 IMPLANT
ELECT REM PT RETURN 9FT ADLT (ELECTROSURGICAL)
ELECTRODE REM PT RTRN 9FT ADLT (ELECTROSURGICAL) IMPLANT
GAUZE SPONGE 4X4 12PLY STRL (GAUZE/BANDAGES/DRESSINGS) ×2 IMPLANT
GAUZE XEROFORM 1X8 LF (GAUZE/BANDAGES/DRESSINGS) ×2 IMPLANT
GLOVE BIO SURGEON STRL SZ8 (GLOVE) ×2 IMPLANT
GLOVE ORTHO TXT STRL SZ7.5 (GLOVE) ×2 IMPLANT
GLOVE SRG 8 PF TXTR STRL LF DI (GLOVE) ×1 IMPLANT
GLOVE SURG UNDER POLY LF SZ8 (GLOVE) ×2
GOWN STRL REUS W/ TWL LRG LVL3 (GOWN DISPOSABLE) ×2 IMPLANT
GOWN STRL REUS W/ TWL XL LVL3 (GOWN DISPOSABLE) ×1 IMPLANT
GOWN STRL REUS W/TWL LRG LVL3 (GOWN DISPOSABLE) ×4
GOWN STRL REUS W/TWL XL LVL3 (GOWN DISPOSABLE) ×2
KIT BASIN OR (CUSTOM PROCEDURE TRAY) ×2 IMPLANT
KIT SHOULDER TRACTION (DRAPES) ×2 IMPLANT
KIT TURNOVER KIT B (KITS) ×2 IMPLANT
MANIFOLD NEPTUNE II (INSTRUMENTS) ×2 IMPLANT
NEEDLE 1/2 CIR CATGUT .05X1.09 (NEEDLE) IMPLANT
NEEDLE HYPO 25GX1X1/2 BEV (NEEDLE) IMPLANT
NEEDLE SCORPION (NEEDLE) IMPLANT
NEEDLE SCORPION MULTI FIRE (NEEDLE) ×2 IMPLANT
NEEDLE SPNL 18GX3.5 QUINCKE PK (NEEDLE) ×2 IMPLANT
NS IRRIG 1000ML POUR BTL (IV SOLUTION) ×2 IMPLANT
PACK SHOULDER (CUSTOM PROCEDURE TRAY) ×2 IMPLANT
PAD ABD 8X10 STRL (GAUZE/BANDAGES/DRESSINGS) IMPLANT
PAD ARMBOARD 7.5X6 YLW CONV (MISCELLANEOUS) ×4 IMPLANT
PROBE APOLLO 90XL (SURGICAL WAND) ×2 IMPLANT
RESTRAINT HEAD UNIVERSAL NS (MISCELLANEOUS) ×2 IMPLANT
SPONGE LAP 4X18 RFD (DISPOSABLE) ×2 IMPLANT
STAPLER VISISTAT 35W (STAPLE) IMPLANT
STRIP CLOSURE SKIN 1/2X4 (GAUZE/BANDAGES/DRESSINGS) IMPLANT
SUT ETHILON 3 0 PS 1 (SUTURE) ×4 IMPLANT
SUTURE TAPE 1.3 40 TPR END (SUTURE) ×1 IMPLANT
SUTURETAPE 1.3 40 TPR END (SUTURE) ×2
SYR CONTROL 10ML LL (SYRINGE) IMPLANT
TOWEL GREEN STERILE (TOWEL DISPOSABLE) ×2 IMPLANT
TOWEL GREEN STERILE FF (TOWEL DISPOSABLE) ×2 IMPLANT
TUBE CONNECTING 12X1/4 (SUCTIONS) ×2 IMPLANT
TUBING ARTHROSCOPY IRRIG 16FT (MISCELLANEOUS) ×2 IMPLANT
WATER STERILE IRR 1000ML POUR (IV SOLUTION) IMPLANT

## 2020-03-23 NOTE — H&P (Signed)
[name] is an 70 y.o. male.   Chief Complaint:   Right shoulder pain and weakness HPI:   The patient is a 70 year old gentleman well-known to me.  He has had right shoulder pain and weakness for some time now.  After the failed conservative treatment MRI was obtained of the right shoulder showing a full-thickness rotator cuff tear.  Fortunately there is not a significant amount of retraction and there is no muscle atrophy.  At this point given his continued right shoulder pain and weakness we are recommending an arthroscopic intervention for the right shoulder.  Past Medical History:  Diagnosis Date  . Anemia    low iron  . Arthritis   . Coronary artery disease 2006   a. remote stenting to ramus, prox LAD x2.  . Dental crowns present   . Diabetes mellitus   . First degree AV block   . GERD (gastroesophageal reflux disease)   . Heart attack ([place]) 06/2004  . Heart murmur    aortic  . Hx MRSA infection 02/2006  . Hyperlipidemia   . Hypertension    hx. of - has not been on med. since losing wt. after gastric bypass  . Hypothyroidism   . Morbid obesity ([place])   . Mucoid cyst of joint 09/2011   left thumb  . Sleep apnea    sleep study 03/29/2011; no CPAP use, lost >130lbs  . Trifascicular block     Past Surgical History:  Procedure Laterality Date  . AMPUTATION Left 04/19/2016   Procedure: Left Foot 4th Toe Amputation vs. Transmetatarsal;  Surgeon: [name], MD;  Location: [facility];  Service: Orthopedics;  Laterality: Left;  . BIOPSY  04/07/2019   Procedure: BIOPSY;  Surgeon: Otis Brace, MD;  Location: [facility];  Service: Gastroenterology;;  . CATARACT EXTRACTION  02/2010; 03/2010  . COLONOSCOPY WITH PROPOFOL N/A 09/14/2014   Procedure: COLONOSCOPY WITH PROPOFOL;  Surgeon: Garlan Fair, MD;  Location: [facility];  Service: Endoscopy;  Laterality: N/A;  . COLONOSCOPY WITH PROPOFOL N/A 04/07/2019   Procedure: COLONOSCOPY WITH PROPOFOL;  Surgeon: Otis Brace, MD;   Location: [facility];  Service: Gastroenterology;  Laterality: N/A;  . CORONARY ANGIOPLASTY WITH STENT PLACEMENT  07/12/2004; 08/03/2004   total of 3 stents  . I & D EXTREMITY Right 06/04/2014   Procedure: IRRIGATION AND DEBRIDEMENT EXTREMITY;  Surgeon: [name], MD;  Location: [facility];  Service: Orthopedics;  Laterality: Right;  . MASS EXCISION  10/04/2011   Procedure: EXCISION MASS;  Surgeon: Wynonia Sours, MD;  Location: [facility];  Service: Orthopedics;  Laterality: Left;  excision cyst debridment ip joint of left thumb  . PROSTATECTOMY    . right below knee amputation  2018  . ROUX-EN-Y GASTRIC BYPASS  10/10/2010   laparoscopic  . TOE AMPUTATION  02/23/2006   left foot second ray amputation  . TRANSMETATARSAL AMPUTATION Left   . TRIGGER FINGER RELEASE Right 09/16/2019   Procedure: RELEASE TRIGGER FINGER/A-1 PULLEY;  Surgeon: Daryll Brod, MD;  Location: [facility];  Service: Orthopedics;  Laterality: Right;  IV REGIONAL FOREARM BLOCK    Family History  Problem Relation Age of Onset  . Heart disease Mother    Social History:  reports that he has never smoked. He has never used smokeless tobacco. He reports that he does not drink alcohol and does not use drugs.  Allergies:  Allergies  Allergen Reactions  . Bactrim [Sulfamethoxazole-Trimethoprim] Itching and Rash  . Moxifloxacin Rash  Avelox Rash at injection site  . Penicillins Rash     Has patient had a PCN reaction causing immediate rash, facial/tongue/throat swelling, SOB or lightheadedness with hypotension: #  #  #  YES  #  #  #  Has patient had a PCN reaction causing severe rash involving mucus membranes or skin necrosis: No Has patient had a PCN reaction that required hospitalization unknown Has patient had a PCN reaction occurring within the last 10 years: No If all of the above answers are "NO", then may proceed with Cephalosporin use.  . Quinolones Itching    Medications Prior  to Admission  Medication Sig Dispense Refill  . aspirin EC 81 MG tablet Take 81 mg by mouth daily.    Marland Kitchen atorvastatin (LIPITOR) 10 MG tablet Take 10 mg by mouth at bedtime.     Marland Kitchen CALCIUM CITRATE PO Take 500 mg by mouth 3 (three) times daily.    . Cyanocobalamin (B-12) 1000 MCG SUBL Place 1,000 mcg under the tongue every Monday, Wednesday, and Friday.    . ferrous sulfate 325 (65 FE) MG tablet Take 325 mg by mouth daily.     . fluticasone (FLONASE) 50 MCG/ACT nasal spray Place 2 sprays into both nostrils daily as needed for allergies.     Marland Kitchen HYDROcodone-acetaminophen (NORCO/VICODIN) 5-325 MG tablet Take 1 tablet by mouth every 6 (six) hours as needed for moderate pain. (Patient taking differently: Take 1 tablet by mouth at bedtime as needed for moderate pain.) 30 tablet 0  . Hypromellose (GENTEAL SEVERE OP) Place 1 drop into the right eye daily as needed (Dry eyes).    Marland Kitchen ibuprofen (ADVIL,MOTRIN) 200 MG tablet Take 400 mg by mouth every 6 (six) hours as needed for headache (pain).    Marland Kitchen levothyroxine (SYNTHROID) 150 MCG tablet Take 150 mcg by mouth daily before breakfast.    . lisinopril (ZESTRIL) 2.5 MG tablet Take 1 tablet (2.5 mg total) by mouth daily. 90 tablet 3  . Melatonin 10 MG TABS Take 10 mg by mouth at bedtime.    . metFORMIN (GLUCOPHAGE-XR) 500 MG 24 hr tablet Take 500 mg by mouth 2 (two) times daily.    . Multiple Vitamin (MULTIVITAMIN WITH MINERALS) TABS tablet Take 2 tablets by mouth in the morning and at bedtime. Gummies    . Multiple Vitamins-Minerals (PRESERVISION AREDS 2) CAPS Take 1 capsule by mouth 2 (two) times daily.    . nabumetone (RELAFEN) 750 MG tablet Take 1 tablet (750 mg total) by mouth 2 (two) times daily as needed. (Patient taking differently: Take 750 mg by mouth 2 (two) times daily.) 60 tablet 1  . nitroGLYCERIN (NITROSTAT) 0.4 MG SL tablet PLACE 1 TABLET (0.4 MG TOTAL) UNDER THE TONGUE EVERY 5 (FIVE) MINUTES X 3 DOSES AS NEEDED FOR CHEST PAIN. 25 tablet 3  . Omega-3  Fatty Acids (FISH OIL TRIPLE STRENGTH PO) Take 57 mg by mouth daily. Chewable    . Omeprazole 20 MG TBEC Take 20 mg by mouth daily as needed (acid reflux/ indigestion).     Marland Kitchen OVER THE COUNTER MEDICATION Take 1 tablet by mouth 2 (two) times daily. [facility] Complex: glucosamine/cat's claw extract    . OVER THE COUNTER MEDICATION Take 30 mg by mouth at bedtime. CBD    . Probiotic Product (PROBIOTIC PO) Take 1 tablet by mouth daily.    . sildenafil (REVATIO) 20 MG tablet Take 40-100 mg by mouth daily as needed (Erectile Dysfunction).     Marland Kitchen  tiZANidine (ZANAFLEX) 4 MG tablet Take 1 tablet (4 mg total) by mouth every 8 (eight) hours as needed for muscle spasms. (Patient taking differently: Take 4 mg by mouth at bedtime.) 40 tablet 1    Results for orders placed or performed during the hospital encounter of 03/23/20 (from the past 48 hour(s))  Glucose, capillary     Status: Abnormal   Collection Time: 03/23/20 11:34 AM  Result Value Ref Range   Glucose-Capillary 189 (H) 70 - 99 mg/dL    Comment: Glucose reference range applies only to samples taken after fasting for at least 8 hours.   Comment 1 Notify RN    Comment 2 Document in Chart    No results found.  Review of Systems  Blood pressure (!) 156/68, pulse 60, temperature 97.8 F (36.6 C), temperature source Oral, resp. rate 17, height 6\' 8"  (2.032 m), weight 111.1 kg, SpO2 100 %. Physical Exam Vitals reviewed.  Constitutional:      Appearance: Normal appearance.  HENT:     Head: Normocephalic and atraumatic.  Eyes:     Extraocular Movements: Extraocular movements intact.     Pupils: Pupils are equal, round, and reactive to light.  Cardiovascular:     Rate and Rhythm: Normal rate.     Pulses: Normal pulses.  Pulmonary:     Effort: Pulmonary effort is normal.  Abdominal:     Palpations: Abdomen is soft.  Musculoskeletal:     Right shoulder: Tenderness present. Decreased range of motion. Decreased strength.     Cervical  back: Normal range of motion.  Neurological:     Mental Status: He is alert and oriented to person, place, and time.  Psychiatric:        Behavior: Behavior normal.      Assessment/Plan Right shoulder full-thickness rotator cuff tear  Our plan is to proceed to surgery today for right shoulder arthroscopy with debridement and subacromial decompression.  If we are able to repair the rotator cuff we will do so arthroscopically.  He understands that this depends on the nature of his collagen fibers and the bone is well.  The risks and benefits of surgery been explained and informed consent is obtained.  [name], MD 03/23/2020, 12:02 PM

## 2020-03-23 NOTE — Progress Notes (Signed)
Orthopedic Tech Progress Note Patient Details:  [name] December 09, 1950 [id number] OR RN called requesting a SHOULDER ABDUCTION PILLOW. Once they realized the LARGE would not work they called back and asked for a X LARGE.  Ortho Devices Type of Ortho Device: Shoulder abduction pillow Ortho Device/Splint Interventions: Other (comment)   Post Interventions Patient Tolerated: Other (comment) Instructions Provided: Other (comment)   Janit Pagan 03/23/2020, 5:30 PM

## 2020-03-23 NOTE — Anesthesia Procedure Notes (Signed)
Procedure Name: Intubation Performed by: Colin Benton, CRNA Pre-anesthesia Checklist: Patient identified, Emergency Drugs available, Suction available and Patient being monitored Patient Re-evaluated:Patient Re-evaluated prior to induction Oxygen Delivery Method: Circle system utilized Preoxygenation: Pre-oxygenation with 100% oxygen Induction Type: IV induction Ventilation: Mask ventilation without difficulty and Oral airway inserted - appropriate to patient size Laryngoscope Size: Miller and 3 Grade View: Grade I Tube type: Oral Tube size: 7.5 mm Number of attempts: 1 Airway Equipment and Method: Stylet and Oral airway Placement Confirmation: ETT inserted through vocal cords under direct vision,  positive ETCO2 and breath sounds checked- equal and bilateral Secured at: 26 cm Tube secured with: Tape Dental Injury: Teeth and Oropharynx as per pre-operative assessment

## 2020-03-23 NOTE — Transfer of Care (Signed)
Immediate Anesthesia Transfer of Care Note  Patient: [name]  Procedure(s) Performed: RIGHT SHOULDER ARTHROSCOPY WITH DEBRIDEMENT AND  ROTATOR CUFF REPAIR (Right Shoulder)  Patient Location: PACU  Anesthesia Type:GA combined with regional for post-op pain  Level of Consciousness: awake, alert  and drowsy  Airway & Oxygen Therapy: Patient Spontanous Breathing  Post-op Assessment: Report given to RN, Post -op Vital signs reviewed and stable and Patient moving all extremities  Post vital signs: Reviewed and stable  Last Vitals:  Vitals Value Taken Time  BP 129/90 03/23/20 1700  Temp 36.2 C 03/23/20 1700  Pulse 83 03/23/20 1703  Resp 13 03/23/20 1703  SpO2 97 % 03/23/20 1703  Vitals shown include unvalidated device data.  Last Pain:  Vitals:   03/23/20 1209  TempSrc:   PainSc: 0-No pain         Complications: No complications documented.

## 2020-03-23 NOTE — Brief Op Note (Signed)
03/23/2020  4:35 PM  PATIENT:  [name]  70 y.o. male  PRE-OPERATIVE DIAGNOSIS:  right shoulder rotator cuff full thickness tear  POST-OPERATIVE DIAGNOSIS:  right shoulder rotator cuff full thickness tear  PROCEDURE:  Procedure(s): RIGHT SHOULDER ARTHROSCOPY WITH DEBRIDEMENT AND  ROTATOR CUFF REPAIR (Right)  SURGEON:  Surgeon(s) and Role:    [name], MD - Primary  PHYSICIAN ASSISTANT:  Benita Stabile, PA-C  ANESTHESIA:   regional and general  COUNTS:  YES  TOURNIQUET:  * No tourniquets in log *  DICTATION: .Other Dictation: Dictation Number [id number]  PLAN OF CARE: Discharge to home after PACU  PATIENT DISPOSITION:  PACU - hemodynamically stable.   Delay start of Pharmacological VTE agent (>24hrs) due to surgical blood loss or risk of bleeding: no

## 2020-03-23 NOTE — Anesthesia Procedure Notes (Signed)
Anesthesia Regional Block: Interscalene brachial plexus block   Pre-Anesthetic Checklist: ,, timeout performed, Correct Patient, Correct Site, Correct Laterality, Correct Procedure, Correct Position, site marked, Risks and benefits discussed, pre-op evaluation,  At surgeon's request and post-op pain management  Laterality: Right  Prep: Maximum Sterile Barrier Precautions used, chloraprep       Needles:  Injection technique: Single-shot  Needle Type: Echogenic Stimulator Needle     Needle Length: 4cm  Needle Gauge: 22     Additional Needles:   Procedures:,,,, ultrasound used (permanent image in chart),,,,  Narrative:  Start time: 03/23/2020 1:33 PM End time: 03/23/2020 1:36 PM Injection made incrementally with aspirations every 5 mL.  Performed by: Personally  Anesthesiologist: Brennan Bailey, MD  Additional Notes: Risks, benefits, and alternative discussed. Patient gave consent for procedure. Patient prepped and draped in sterile fashion. Sedation administered, patient remains easily responsive to voice. Relevant anatomy identified with ultrasound guidance. Local anesthetic given in 5cc increments with no signs or symptoms of intravascular injection. No pain or paraesthesias with injection. Patient monitored throughout procedure with signs of LAST or immediate complications. Tolerated well. Ultrasound image placed in chart.  Tawny Asal, MD

## 2020-03-24 ENCOUNTER — Encounter (HOSPITAL_COMMUNITY): Payer: Self-pay | Admitting: Orthopaedic Surgery

## 2020-03-24 NOTE — Anesthesia Postprocedure Evaluation (Signed)
Anesthesia Post Note  Patient: [name]  Procedure(s) Performed: RIGHT SHOULDER ARTHROSCOPY WITH DEBRIDEMENT AND  ROTATOR CUFF REPAIR (Right Shoulder)     Patient location during evaluation: PACU Anesthesia Type: General and Regional Level of consciousness: awake and alert Pain management: pain level controlled Vital Signs Assessment: post-procedure vital signs reviewed and stable Respiratory status: spontaneous breathing, nonlabored ventilation, respiratory function stable and patient connected to nasal cannula oxygen Cardiovascular status: blood pressure returned to baseline and stable Postop Assessment: no apparent nausea or vomiting Anesthetic complications: no   No complications documented.  Last Vitals:  Vitals:   03/23/20 1815 03/23/20 1816  BP: 122/81 122/81  Pulse: 74 73  Resp: 14 13  Temp:  36.4 C  SpO2: 100% 99%    Last Pain:  Vitals:   03/23/20 1816  TempSrc: Oral  PainSc: 0-No pain   Pain Goal:                   Barnet Glasgow

## 2020-03-24 NOTE — Op Note (Signed)
NAMEPRANISH, AKHAVAN MEDICAL RECORD NO: [id number] ACCOUNT NO: [id number] DATE OF BIRTH: 04-13-1950 FACILITY: [facility] LOCATION: [facility] PHYSICIAN: Lind Guest. [name], MD  Operative Report   DATE OF PROCEDURE: 03/23/2020  PREOPERATIVE DIAGNOSIS:  Right shoulder with full thickness rotator cuff tear.  POSTOPERATIVE DIAGNOSIS:  Right shoulder with full thickness rotator cuff tear.  PROCEDURE:  Right shoulder arthroscopy with debridement and arthroscopically-assisted rotator cuff repair.  SURGEON:  Lind Guest. [name], MD  ASSISTANT:  Erskine Emery, PA-C  ANESTHESIA: 1.  Right shoulder regional block. 2.  General.  ESTIMATED BLOOD LOSS:  Minimal.  COMPLICATIONS:  None.  INDICATIONS:  The patient is a 70 year old gentleman with a full thickness tear of his rotator cuff on the right side.  We have tried and failed conservative treatment including activity modification and rest as well as anti-inflammatories and steroid  injections.  We did talk about the role of arthroscopic surgery given the failure of conservative treatment, his continued pain and weakness.  He understands that given the nature of his collagen fibers and age as well as previous medical issues that we  may not be able to repair this, but cleaning out the soft tissue may be worthwhile at least to get his pain to decrease.  He did wish to proceed with surgery at this point.  DESCRIPTION OF PROCEDURE:  After informed consent was obtained, appropriate right shoulder was marked.  Anesthesia obtained a regional shoulder block in the holding room.  He was then brought to the operating room and placed supine on the operating  table.  General anesthesia was then obtained.  He was then fashioned in the beach chair position with appropriate positioning and padding of the head and neck and appropriate positioning of the down nonoperative left arm.  There was bending at the waist  and knees and palpable pulses in his left foot.  He  has a BKA on the right side.  We then prepped his right shoulder with DuraPrep and sterile drapes.  We placed him in inline skeletal traction using a fishing pole traction device and 10 pounds of  traction.  A timeout was called and he was identified as correct patient, correct right shoulder.  I then entered into the glenohumeral joint through a posterolateral portal and placed a camera into the shoulder.  We found the glenoid cartilage to be  intact as well as the humeral head cartilage.  We could see somewhat of a rotator cuff tear, but it was odd tear.  The biceps tendon was intact.  The subscapularis was also intact.  We then made an anterior portal at the rotator interval and placed a  soft tissue ablation wand in the shoulder to perform a minimal debridement in the glenohumeral joint of some inflamed tissue.  I then entered the subacromial space through the posterior portal, and made two separate lateral portals.  We were able to  mobilize the rotator cuff and I actually found a split tear of the cuff, but the majority of the cuff was intact.  This was something that we could repair with a margin convergence type of suture.  We performed a bursectomy and removed inflamed tissue,  all fluid out the subacromial space and did not need to perform ____ acromioplasty.  I then used an Arthrex FiberTape suture and the Scorpion suture passer to place one limb of the suture through the lateral cuff and one through the medial cuff of the  supraspinatus that was split.  We were able to  then tie this down and bring them back together as two flaps with a margin convergence suture with a sliding knot.  We then cut that knot and put the shoulder through internal and external rotation and the  cuff moved as a unit with the humeral head.  We then removed all instrumentation from the shoulder and closed the portal sites with interrupted nylon sutures.  Xeroform well-padded sterile dressing was applied.  His shoulder was  placed in a padded  abduction shoulder pillow sling.  He was awakened, extubated, and taken to recovery room in stable condition with all final counts being correct, no complications noted.  Of note, Benita Stabile, PA-C's assistance was crucial throughout every aspect of this  case.  Postoperatively, we will discharge the patient home from PACU with postoperative wound care and shoulder motion instructions and appropriate followup.   NIK D: 03/23/2020 4:34:11 pm T: 03/24/2020 1:28:00 am  JOB: [id number]/ [id number]

## 2020-03-27 ENCOUNTER — Inpatient Hospital Stay (HOSPITAL_COMMUNITY): Payer: Medicare Other

## 2020-03-27 ENCOUNTER — Encounter (HOSPITAL_COMMUNITY): Payer: Self-pay | Admitting: Emergency Medicine

## 2020-03-27 ENCOUNTER — Inpatient Hospital Stay (HOSPITAL_COMMUNITY)
Admission: EM | Admit: 2020-03-27 | Discharge: 2020-04-03 | DRG: 522 | Disposition: A | Discharge status: Skilled Nursing Facility | Payer: Medicare Other | Attending: Student | Admitting: Student

## 2020-03-27 ENCOUNTER — Other Ambulatory Visit: Payer: Self-pay

## 2020-03-27 ENCOUNTER — Emergency Department (HOSPITAL_COMMUNITY): Payer: Medicare Other

## 2020-03-27 DIAGNOSIS — Z862 Personal history of diseases of the blood and blood-forming organs and certain disorders involving the immune mechanism: Secondary | ICD-10-CM

## 2020-03-27 DIAGNOSIS — M25572 Pain in left ankle and joints of left foot: Secondary | ICD-10-CM | POA: Diagnosis not present

## 2020-03-27 DIAGNOSIS — Z955 Presence of coronary angioplasty implant and graft: Secondary | ICD-10-CM | POA: Diagnosis not present

## 2020-03-27 DIAGNOSIS — M25551 Pain in right hip: Secondary | ICD-10-CM | POA: Diagnosis not present

## 2020-03-27 DIAGNOSIS — I2510 Atherosclerotic heart disease of native coronary artery without angina pectoris: Secondary | ICD-10-CM | POA: Diagnosis not present

## 2020-03-27 DIAGNOSIS — Z7982 Long term (current) use of aspirin: Secondary | ICD-10-CM | POA: Diagnosis not present

## 2020-03-27 DIAGNOSIS — I452 Bifascicular block: Secondary | ICD-10-CM | POA: Diagnosis present

## 2020-03-27 DIAGNOSIS — Z79899 Other long term (current) drug therapy: Secondary | ICD-10-CM

## 2020-03-27 DIAGNOSIS — E1151 Type 2 diabetes mellitus with diabetic peripheral angiopathy without gangrene: Secondary | ICD-10-CM | POA: Diagnosis present

## 2020-03-27 DIAGNOSIS — W1830XA Fall on same level, unspecified, initial encounter: Secondary | ICD-10-CM | POA: Diagnosis present

## 2020-03-27 DIAGNOSIS — M6281 Muscle weakness (generalized): Secondary | ICD-10-CM | POA: Diagnosis not present

## 2020-03-27 DIAGNOSIS — S72001D Fracture of unspecified part of neck of right femur, subsequent encounter for closed fracture with routine healing: Secondary | ICD-10-CM

## 2020-03-27 DIAGNOSIS — K219 Gastro-esophageal reflux disease without esophagitis: Secondary | ICD-10-CM | POA: Diagnosis not present

## 2020-03-27 DIAGNOSIS — D509 Iron deficiency anemia, unspecified: Secondary | ICD-10-CM | POA: Diagnosis present

## 2020-03-27 DIAGNOSIS — Z89511 Acquired absence of right leg below knee: Secondary | ICD-10-CM | POA: Diagnosis not present

## 2020-03-27 DIAGNOSIS — I739 Peripheral vascular disease, unspecified: Secondary | ICD-10-CM | POA: Diagnosis not present

## 2020-03-27 DIAGNOSIS — S72001A Fracture of unspecified part of neck of right femur, initial encounter for closed fracture: Secondary | ICD-10-CM | POA: Diagnosis not present

## 2020-03-27 DIAGNOSIS — K5900 Constipation, unspecified: Secondary | ICD-10-CM | POA: Diagnosis present

## 2020-03-27 DIAGNOSIS — M75121 Complete rotator cuff tear or rupture of right shoulder, not specified as traumatic: Secondary | ICD-10-CM | POA: Diagnosis not present

## 2020-03-27 DIAGNOSIS — W19XXXA Unspecified fall, initial encounter: Secondary | ICD-10-CM | POA: Diagnosis not present

## 2020-03-27 DIAGNOSIS — Z96641 Presence of right artificial hip joint: Secondary | ICD-10-CM | POA: Diagnosis not present

## 2020-03-27 DIAGNOSIS — Z89432 Acquired absence of left foot: Secondary | ICD-10-CM | POA: Diagnosis not present

## 2020-03-27 DIAGNOSIS — Z9884 Bariatric surgery status: Secondary | ICD-10-CM

## 2020-03-27 DIAGNOSIS — I10 Essential (primary) hypertension: Secondary | ICD-10-CM | POA: Diagnosis not present

## 2020-03-27 DIAGNOSIS — I440 Atrioventricular block, first degree: Secondary | ICD-10-CM | POA: Diagnosis present

## 2020-03-27 DIAGNOSIS — I110 Hypertensive heart disease with heart failure: Secondary | ICD-10-CM | POA: Diagnosis not present

## 2020-03-27 DIAGNOSIS — Z96649 Presence of unspecified artificial hip joint: Secondary | ICD-10-CM

## 2020-03-27 DIAGNOSIS — S43421D Sprain of right rotator cuff capsule, subsequent encounter: Secondary | ICD-10-CM | POA: Diagnosis not present

## 2020-03-27 DIAGNOSIS — Z01818 Encounter for other preprocedural examination: Secondary | ICD-10-CM | POA: Diagnosis not present

## 2020-03-27 DIAGNOSIS — I5042 Chronic combined systolic (congestive) and diastolic (congestive) heart failure: Secondary | ICD-10-CM | POA: Diagnosis not present

## 2020-03-27 DIAGNOSIS — Z79890 Hormone replacement therapy (postmenopausal): Secondary | ICD-10-CM | POA: Diagnosis not present

## 2020-03-27 DIAGNOSIS — E782 Mixed hyperlipidemia: Secondary | ICD-10-CM | POA: Diagnosis present

## 2020-03-27 DIAGNOSIS — I252 Old myocardial infarction: Secondary | ICD-10-CM

## 2020-03-27 DIAGNOSIS — S72091A Other fracture of head and neck of right femur, initial encounter for closed fracture: Secondary | ICD-10-CM | POA: Diagnosis not present

## 2020-03-27 DIAGNOSIS — Z743 Need for continuous supervision: Secondary | ICD-10-CM | POA: Diagnosis not present

## 2020-03-27 DIAGNOSIS — Z9713 Presence of artificial right leg (complete) (partial): Secondary | ICD-10-CM

## 2020-03-27 DIAGNOSIS — Z9181 History of falling: Secondary | ICD-10-CM | POA: Diagnosis not present

## 2020-03-27 DIAGNOSIS — Y92009 Unspecified place in unspecified non-institutional (private) residence as the place of occurrence of the external cause: Secondary | ICD-10-CM

## 2020-03-27 DIAGNOSIS — E1159 Type 2 diabetes mellitus with other circulatory complications: Secondary | ICD-10-CM | POA: Diagnosis present

## 2020-03-27 DIAGNOSIS — Z20822 Contact with and (suspected) exposure to covid-19: Secondary | ICD-10-CM | POA: Diagnosis not present

## 2020-03-27 DIAGNOSIS — S72009A Fracture of unspecified part of neck of unspecified femur, initial encounter for closed fracture: Secondary | ICD-10-CM | POA: Diagnosis present

## 2020-03-27 DIAGNOSIS — Z7984 Long term (current) use of oral hypoglycemic drugs: Secondary | ICD-10-CM | POA: Diagnosis not present

## 2020-03-27 DIAGNOSIS — R278 Other lack of coordination: Secondary | ICD-10-CM | POA: Diagnosis not present

## 2020-03-27 DIAGNOSIS — R2689 Other abnormalities of gait and mobility: Secondary | ICD-10-CM | POA: Diagnosis not present

## 2020-03-27 DIAGNOSIS — R279 Unspecified lack of coordination: Secondary | ICD-10-CM | POA: Diagnosis not present

## 2020-03-27 DIAGNOSIS — Z471 Aftercare following joint replacement surgery: Secondary | ICD-10-CM | POA: Diagnosis not present

## 2020-03-27 DIAGNOSIS — I5032 Chronic diastolic (congestive) heart failure: Secondary | ICD-10-CM | POA: Diagnosis present

## 2020-03-27 DIAGNOSIS — Z01810 Encounter for preprocedural cardiovascular examination: Secondary | ICD-10-CM

## 2020-03-27 DIAGNOSIS — E039 Hypothyroidism, unspecified: Secondary | ICD-10-CM | POA: Diagnosis present

## 2020-03-27 DIAGNOSIS — R5381 Other malaise: Secondary | ICD-10-CM | POA: Diagnosis not present

## 2020-03-27 DIAGNOSIS — R001 Bradycardia, unspecified: Secondary | ICD-10-CM | POA: Diagnosis not present

## 2020-03-27 LAB — CBC WITH DIFFERENTIAL/PLATELET
Abs Immature Granulocytes: 0.04 10*3/uL (ref 0.00–0.07)
Basophils Absolute: 0 10*3/uL (ref 0.0–0.1)
Basophils Relative: 0 %
Eosinophils Absolute: 0.2 10*3/uL (ref 0.0–0.5)
Eosinophils Relative: 3 %
HCT: 41.8 % (ref 39.0–52.0)
Hemoglobin: 14.2 g/dL (ref 13.0–17.0)
Immature Granulocytes: 0 %
Lymphocytes Relative: 8 %
Lymphs Abs: 0.8 10*3/uL (ref 0.7–4.0)
MCH: 31.3 pg (ref 26.0–34.0)
MCHC: 34 g/dL (ref 30.0–36.0)
MCV: 92.3 fL (ref 80.0–100.0)
Monocytes Absolute: 0.7 10*3/uL (ref 0.1–1.0)
Monocytes Relative: 8 %
Neutro Abs: 7.3 10*3/uL (ref 1.7–7.7)
Neutrophils Relative %: 81 %
Platelets: 186 10*3/uL (ref 150–400)
RBC: 4.53 MIL/uL (ref 4.22–5.81)
RDW: 13.7 % (ref 11.5–15.5)
WBC: 9.1 10*3/uL (ref 4.0–10.5)
nRBC: 0 % (ref 0.0–0.2)

## 2020-03-27 LAB — PROTIME-INR
INR: 1.1 (ref 0.8–1.2)
Prothrombin Time: 13.9 seconds (ref 11.4–15.2)

## 2020-03-27 LAB — HEPATIC FUNCTION PANEL
ALT: 18 U/L (ref 0–44)
AST: 21 U/L (ref 15–41)
Albumin: 3.7 g/dL (ref 3.5–5.0)
Alkaline Phosphatase: 49 U/L (ref 38–126)
Bilirubin, Direct: 0.1 mg/dL (ref 0.0–0.2)
Indirect Bilirubin: 1.3 mg/dL — ABNORMAL HIGH (ref 0.3–0.9)
Total Bilirubin: 1.4 mg/dL — ABNORMAL HIGH (ref 0.3–1.2)
Total Protein: 6.7 g/dL (ref 6.5–8.1)

## 2020-03-27 LAB — BASIC METABOLIC PANEL
Anion gap: 10 (ref 5–15)
BUN: 17 mg/dL (ref 8–23)
CO2: 24 mmol/L (ref 22–32)
Calcium: 9.1 mg/dL (ref 8.9–10.3)
Chloride: 103 mmol/L (ref 98–111)
Creatinine, Ser: 1.05 mg/dL (ref 0.61–1.24)
GFR, Estimated: >60 mL/min (ref >60)
Glucose, Bld: 145 mg/dL — ABNORMAL HIGH (ref 70–99)
Potassium: 3.7 mmol/L (ref 3.5–5.1)
Sodium: 137 mmol/L (ref 135–145)

## 2020-03-27 LAB — APTT: aPTT: 30 seconds (ref 24–36)

## 2020-03-27 LAB — RESP PANEL BY RT-PCR (FLU A&B, COVID) ARPGX2
Influenza A by PCR: NEGATIVE
Influenza B by PCR: NEGATIVE
SARS Coronavirus 2 by RT PCR: NEGATIVE

## 2020-03-27 LAB — TYPE AND SCREEN
ABO/RH(D): O POS
Antibody Screen: NEGATIVE

## 2020-03-27 LAB — GLUCOSE, CAPILLARY: Glucose-Capillary: 182 mg/dL — ABNORMAL HIGH (ref 70–99)

## 2020-03-27 MED ORDER — LISINOPRIL 5 MG PO TABS
2.5000 mg | ORAL_TABLET | Freq: Every day | ORAL | Status: DC
  Administered 2020-03-28 – 2020-04-02 (×6): 2.5 mg via ORAL
  Filled 2020-03-27 (×6): qty 1

## 2020-03-27 MED ORDER — ATORVASTATIN CALCIUM 10 MG PO TABS
10.0000 mg | ORAL_TABLET | Freq: Every day | ORAL | Status: DC
  Administered 2020-03-27 – 2020-04-02 (×7): 10 mg via ORAL
  Filled 2020-03-27 (×7): qty 1

## 2020-03-27 MED ORDER — OXYCODONE HCL 5 MG PO TABS
5.0000 mg | ORAL_TABLET | Freq: Four times a day (QID) | ORAL | Status: DC | PRN
  Administered 2020-03-27: 5 mg via ORAL
  Administered 2020-03-28: 10 mg via ORAL
  Administered 2020-03-28: 5 mg via ORAL
  Filled 2020-03-27 (×3): qty 2
  Filled 2020-03-27: qty 1

## 2020-03-27 MED ORDER — MORPHINE SULFATE (PF) 2 MG/ML IV SOLN
2.0000 mg | INTRAVENOUS | Status: DC | PRN
Start: 2020-03-27 — End: 2020-03-29
  Administered 2020-03-28: 4 mg via INTRAVENOUS
  Administered 2020-03-28: 2 mg via INTRAVENOUS
  Filled 2020-03-27: qty 1
  Filled 2020-03-27: qty 2

## 2020-03-27 MED ORDER — HYDROCODONE-ACETAMINOPHEN 5-325 MG PO TABS
1.0000 | ORAL_TABLET | Freq: Four times a day (QID) | ORAL | Status: DC | PRN
Start: 2020-03-27 — End: 2020-03-29
  Administered 2020-03-28 – 2020-03-29 (×2): 2 via ORAL
  Filled 2020-03-27 (×2): qty 2

## 2020-03-27 MED ORDER — INSULIN ASPART 100 UNIT/ML ~~LOC~~ SOLN
0.0000 [IU] | SUBCUTANEOUS | Status: DC
  Administered 2020-03-28 (×2): 1 [IU] via SUBCUTANEOUS
  Administered 2020-03-28: 3 [IU] via SUBCUTANEOUS
  Administered 2020-03-28 (×2): 2 [IU] via SUBCUTANEOUS
  Administered 2020-03-29: 1 [IU] via SUBCUTANEOUS
  Administered 2020-03-29: 2 [IU] via SUBCUTANEOUS
  Administered 2020-03-29: 1 [IU] via SUBCUTANEOUS
  Administered 2020-03-29: 2 [IU] via SUBCUTANEOUS
  Administered 2020-03-29: 1 [IU] via SUBCUTANEOUS
  Administered 2020-03-30: 3 [IU] via SUBCUTANEOUS
  Administered 2020-03-30 (×2): 2 [IU] via SUBCUTANEOUS
  Administered 2020-03-30: 3 [IU] via SUBCUTANEOUS

## 2020-03-27 MED ORDER — PANTOPRAZOLE SODIUM 40 MG PO TBEC
40.0000 mg | DELAYED_RELEASE_TABLET | Freq: Every day | ORAL | Status: DC
  Administered 2020-03-28 – 2020-04-02 (×6): 40 mg via ORAL
  Filled 2020-03-27 (×6): qty 1

## 2020-03-27 MED ORDER — METHOCARBAMOL 1000 MG/10ML IJ SOLN
500.0000 mg | Freq: Four times a day (QID) | INTRAVENOUS | Status: DC | PRN
  Filled 2020-03-27: qty 5

## 2020-03-27 MED ORDER — METHOCARBAMOL 500 MG PO TABS
500.0000 mg | ORAL_TABLET | Freq: Four times a day (QID) | ORAL | Status: DC | PRN
  Administered 2020-03-28 (×2): 500 mg via ORAL
  Filled 2020-03-27 (×2): qty 1

## 2020-03-27 MED ORDER — FENTANYL CITRATE (PF) 100 MCG/2ML IJ SOLN
50.0000 ug | INTRAMUSCULAR | Status: DC | PRN
  Administered 2020-03-27: 50 ug via INTRAVENOUS
  Filled 2020-03-27: qty 2

## 2020-03-27 MED ORDER — LEVOTHYROXINE SODIUM 75 MCG PO TABS
150.0000 ug | ORAL_TABLET | Freq: Every day | ORAL | Status: DC
  Administered 2020-03-29 – 2020-04-02 (×5): 150 ug via ORAL
  Filled 2020-03-27 (×5): qty 2

## 2020-03-27 NOTE — Progress Notes (Signed)
Dr. [name] will transfer to [facility] and take care of this patient on Monday.

## 2020-03-27 NOTE — ED Triage Notes (Signed)
Patient BIBA c/o right-sided hip pain d/t a fall that occurred last Tuesday. Patient reports that he had rotator cuff surgery last Tuesday. He states he was trying to sit on his scooter device and fell over on right hip. He was a right BKA. Reports taking oxycodone 5 mg and ibuprofen for pain with some relief.    BP 140/100 HR 80 98% RA

## 2020-03-27 NOTE — Progress Notes (Signed)
Ortho aware of patient, plan to discuss patient with Dr. [name] for chart review and consult regarding hip fracture

## 2020-03-27 NOTE — Plan of Care (Signed)

## 2020-03-27 NOTE — H&P (Signed)
[name] CLE:[id number] DOB: June 25, 1950 DOA: 03/27/2020     PCP: [name], MD   Outpatient Specialists:   CARDS:  Dr.Skains   Orthopedics: [name] Patient arrived to ER on 03/27/20 at 79 Referred by Attending Blanchie Dessert, MD   Patient coming from: home Lives  With family    Chief Complaint: right hip pain  HPI: [name] is a 70 y.o. male with medical history significant of hypertension hyperlipidemia diabetes type 2 , OSA now off CPAP, sp gastric bypass 2012, GERD HLD, HTN  sp r BKA,  right shoulder   full-thickness rotator cuff tear iron deficiency anemia  Presented with  Right hip pain after a fall  24 March 2019 patient was admitted by Dr. [name] for full-thickness rotator cuff tear of the right shoulder undergone orthoscopic intervention and was able to be discharged to home  Patient had a fall after his procedure once he went home. He was trying to move into his scooter to undo prosthesis on his Right leg and misjudged the distance falling down.  When he fell he landed on his right hip. No LOC no head injury He has been taking some oxycodone and ibuprofen for pain relief but continues to have right hip pain he have not been able to use his prosthetic leg secondary to pain. He has been sitting in recliner most of the time. Denies any fevers or chills no chest pain or shortness of breath  Infectious risk factors:  Reports none   Has  been vaccinated against COVID and boosted   Initial COVID TEST  NEGATIVE   Lab Results  Component Value Date   SARSCOV2NAA NEGATIVE 03/27/2020   [place] NEGATIVE 03/20/2020   [place] NEGATIVE 09/12/2019   [place] NEGATIVE 04/03/2019     Regarding pertinent Chronic problems:     Hyperlipidemia -  on statins Lipitor Lipid Panel  No results found for: CHOL, TRIG, HDL, CHOLHDL, VLDL, LDLCALC, LDLDIRECT, LABVLDL  Sp R BKA   HTN on Lisinopril   chronic CHF diastolic/systolic/ combined - last echo  03/2016 42 mm dilated aortic root, mild aortic stenosis and mild aortic regurgitation.  EF of 45 to 50%.    CAD  - On Aspirin, statin,                  - followed by cardiology                -  PCI to ramus in 2006.  2 stents to the proximal LAD.  Diffuse disease in distal RCA Nuclear stress test 2017 showed fixed inferior defect.  First-degree AV block bifascicular block sinus bradycardia noted previously.   DM 2 -  Lab Results  Component Value Date   HGBA1C 6.5 (H) 09/26/2015   on PO meds only,     Hypothyroidism:  Lab Results  Component Value Date   TSH 1.682 04/17/2016   on synthroid  While in ER: Hip imaging showed today right femoral neck fracture with varus angulation   ER Provider Called:   Orthopedics Dr.Magnant  They Recommend admit to medicine plant for OR in AM Will see in AM   Hospitalist was called for admission for Right hip fracture  The following Work up has been ordered so far:  Orders Placed This Encounter  Procedures  . Resp Panel by RT-PCR (Flu A&B, Covid) Nasopharyngeal Swab  . DG Hip Unilat With Pelvis 2-3 Views Right  . Basic metabolic panel  . CBC WITH DIFFERENTIAL  .  Protime-INR  . APTT  . Diet NPO time specified  . Check CMS  . Bed rest  . Initiate Carrier Fluid Protocol  . Consult to orthopedic surgery  . Consult to hospitalist  . ED EKG  . Type and screen [place]  . ABO/Rh    Following Medications were ordered in ER: Medications  fentaNYL (SUBLIMAZE) injection 50 mcg (has no administration in time range)        Consult Orders  (From admission, onward)         Start     Ordered   03/27/20 1752  Consult to hospitalist  Once       Provider:  (Not yet assigned)  Question Answer Comment  Place call to: [facility]   Reason for Consult Admit      03/27/20 1752          Significant initial  Findings: Abnormal Labs Reviewed  BASIC METABOLIC PANEL - Abnormal; Notable for the following  components:      Result Value   Glucose, Bld 145 (*)    All other components within normal limits    Otherwise labs showing:    Recent Labs  Lab 03/23/20 1228 03/27/20 1716  NA 142 137  K 3.9 3.7  CO2 19* 24  GLUCOSE 180* 145*  BUN 11 17  CREATININE 0.96 1.05  CALCIUM 9.1 9.1    Cr  * stable,  Up from baseline see below Lab Results  Component Value Date   CREATININE 1.05 03/27/2020   CREATININE 0.96 03/23/2020   CREATININE 0.90 04/07/2019    No results for input(s): AST, ALT, ALKPHOS, BILITOT, PROT, ALBUMIN in the last 168 hours. Lab Results  Component Value Date   CALCIUM 9.1 03/27/2020     WBC       Component Value Date/Time   WBC 9.1 03/27/2020 1716   LYMPHSABS 0.8 03/27/2020 1716   MONOABS 0.7 03/27/2020 1716   EOSABS 0.2 03/27/2020 1716   BASOSABS 0.0 03/27/2020 1716     Plt: Lab Results  Component Value Date   PLT 186 03/27/2020    HG/HCT  stable,       Component Value Date/Time   HGB 14.2 03/27/2020 1716   HCT 41.8 03/27/2020 1716   MCV 92.3 03/27/2020 1716    No results for input(s): LIPASE, AMYLASE in the last 168 hours. No results for input(s): AMMONIA in the last 168 hours.      ECG: Ordered Personally reviewed by me showing: HR : 74 Rhythm: SR,  RBBB, 1st degree AV block   no evidence of ischemic changes QTC 479     UA not ordered   CXR - Ordered     ED Triage Vitals  Enc Vitals Group     BP 03/27/20 1617 (!) 166/87     Pulse Rate 03/27/20 1617 70     Resp 03/27/20 1617 18     Temp 03/27/20 1617 98.3 F (36.8 C)     Temp Source 03/27/20 1617 Oral     SpO2 03/27/20 1617 98 %     Weight --      Height --      Head Circumference --      Peak Flow --      Pain Score 03/27/20 1618 2     Pain Loc --      Pain Edu? --      Excl. in [place]? --   VEHM(09)@  Latest  Blood pressure (!) 166/87, pulse 70, temperature 98.3 F (36.8 C), temperature source Oral, resp. rate 18, SpO2 98 %.    Review of Systems:     Pertinent positives include:right hip pain  Constitutional:  No weight loss, night sweats, Fevers, chills, fatigue, weight loss  HEENT:  No headaches, Difficulty swallowing,Tooth/dental problems,Sore throat,  No sneezing, itching, ear ache, nasal congestion, post nasal drip,  Cardio-vascular:  No chest pain, Orthopnea, PND, anasarca, dizziness, palpitations.no Bilateral lower extremity swelling  GI:  No heartburn, indigestion, abdominal pain, nausea, vomiting, diarrhea, change in bowel habits, loss of appetite, melena, blood in stool, hematemesis Resp:  no shortness of breath at rest. No dyspnea on exertion, No excess mucus, no productive cough, No non-productive cough, No coughing up of blood.No change in color of mucus.No wheezing. Skin:  no rash or lesions. No jaundice GU:  no dysuria, change in color of urine, no urgency or frequency. No straining to urinate.  No flank pain.  Musculoskeletal:  No joint pain or no joint swelling. No decreased range of motion. No back pain.  Psych:  No change in mood or affect. No depression or anxiety. No memory loss.  Neuro: no localizing neurological complaints, no tingling, no weakness, no double vision, no gait abnormality, no slurred speech, no confusion  All systems reviewed and apart from [place] all are negative  Past Medical History:   Past Medical History:  Diagnosis Date  . Anemia    low iron  . Arthritis   . Coronary artery disease 2006   a. remote stenting to ramus, prox LAD x2.  . Dental crowns present   . Diabetes mellitus   . First degree AV block   . GERD (gastroesophageal reflux disease)   . Heart attack ([place]) 06/2004  . Heart murmur    aortic  . Hx MRSA infection 02/2006  . Hyperlipidemia   . Hypertension    hx. of - has not been on med. since losing wt. after gastric bypass  . Hypothyroidism   . Morbid obesity ([place])   . Mucoid cyst of joint 09/2011   left thumb  . Sleep apnea    sleep study 03/29/2011; no CPAP use,  lost >130lbs  . Trifascicular block       Past Surgical History:  Procedure Laterality Date  . AMPUTATION Left 04/19/2016   Procedure: Left Foot 4th Toe Amputation vs. Transmetatarsal;  Surgeon: [name], MD;  Location: [facility];  Service: Orthopedics;  Laterality: Left;  . BIOPSY  04/07/2019   Procedure: BIOPSY;  Surgeon: Otis Brace, MD;  Location: [facility];  Service: Gastroenterology;;  . CATARACT EXTRACTION  02/2010; 03/2010  . COLONOSCOPY WITH PROPOFOL N/A 09/14/2014   Procedure: COLONOSCOPY WITH PROPOFOL;  Surgeon: Garlan Fair, MD;  [facility]: [facility];  Service: Endoscopy;  Laterality: N/A;  . COLONOSCOPY WITH PROPOFOL N/A 04/07/2019   Procedure: COLONOSCOPY WITH PROPOFOL;  Surgeon: Otis Brace, MD;  Location: [facility];  Service: Gastroenterology;  Laterality: N/A;  . CORONARY ANGIOPLASTY WITH STENT PLACEMENT  07/12/2004; 08/03/2004   total of 3 stents  . I & D EXTREMITY Right 06/04/2014   Procedure: IRRIGATION AND DEBRIDEMENT EXTREMITY;  Surgeon: [name], MD;  Location: [facility];  Service: Orthopedics;  Laterality: Right;  . MASS EXCISION  10/04/2011   Procedure: EXCISION MASS;  Surgeon: Wynonia Sours, MD;  Location: [facility];  Service: Orthopedics;  Laterality: Left;  excision cyst debridment ip joint of left thumb  .  PROSTATECTOMY    . right below knee amputation  2018  . ROUX-EN-Y GASTRIC BYPASS  10/10/2010   laparoscopic  . SHOULDER ARTHROSCOPY WITH ROTATOR CUFF REPAIR AND SUBACROMIAL DECOMPRESSION Right 03/23/2020   Procedure: RIGHT SHOULDER ARTHROSCOPY WITH DEBRIDEMENT AND  ROTATOR CUFF REPAIR;  Surgeon: [name], MD;  Location: [facility];  Service: Orthopedics;  Laterality: Right;  . TOE AMPUTATION  02/23/2006   left foot second ray amputation  . TRANSMETATARSAL AMPUTATION Left   . TRIGGER FINGER RELEASE Right 09/16/2019   Procedure: RELEASE TRIGGER FINGER/A-1 PULLEY;  Surgeon: Daryll Brod, MD;  Location: [facility];  Service: Orthopedics;  Laterality: Right;  IV REGIONAL FOREARM BLOCK    Social History:  Ambulatory  independently  With prosthesis     reports that he has never smoked. He has never used smokeless tobacco. He reports that he does not drink alcohol and does not use drugs.     Family History:   Family History  Problem Relation Age of Onset  . Heart disease Mother     Allergies: Allergies  Allergen Reactions  . Bactrim [Sulfamethoxazole-Trimethoprim] Itching and Rash  . Moxifloxacin Rash    Avelox Rash at injection site  . Penicillins Rash     Has patient had a PCN reaction causing immediate rash, facial/tongue/throat swelling, SOB or lightheadedness with hypotension: #  #  #  YES  #  #  #  Has patient had a PCN reaction causing severe rash involving mucus membranes or skin necrosis: No Has patient had a PCN reaction that required hospitalization unknown Has patient had a PCN reaction occurring within the last 10 years: No If all of the above answers are "NO", then may proceed with Cephalosporin use.  . Quinolones Itching     Prior to Admission medications   Medication Sig Start Date End Date Taking? Authorizing Provider  aspirin EC 81 MG tablet Take 81 mg by mouth daily.    [provider]  atorvastatin (LIPITOR) 10 MG tablet Take 10 mg by mouth at bedtime.     [provider]  CALCIUM CITRATE PO Take 500 mg by mouth 3 (three) times daily.    [provider]  Cyanocobalamin (B-12) 1000 MCG SUBL Place 1,000 mcg under the tongue every Monday, Wednesday, and Friday.    [provider]  ferrous sulfate 325 (65 FE) MG tablet Take 325 mg by mouth daily.     [provider]  fluticasone (FLONASE) 50 MCG/ACT nasal spray Place 2 sprays into both nostrils daily as needed for allergies.     [provider]  HYDROcodone-acetaminophen (NORCO/VICODIN) 5-325 MG tablet Take 1 tablet by mouth every 6 (six) hours as  needed for moderate pain. Patient taking differently: Take 1 tablet by mouth at bedtime as needed for moderate pain. 02/20/20   [name], MD  Hypromellose (GENTEAL SEVERE OP) Place 1 drop into the right eye daily as needed (Dry eyes).    [provider]  ibuprofen (ADVIL,MOTRIN) 200 MG tablet Take 400 mg by mouth every 6 (six) hours as needed for headache (pain).    [provider]  levothyroxine (SYNTHROID) 150 MCG tablet Take 150 mcg by mouth daily before breakfast.    [provider]  lisinopril (ZESTRIL) 2.5 MG tablet Take 1 tablet (2.5 mg total) by mouth daily. 12/22/19   Jerline Pain, MD  Melatonin 10 MG TABS Take 10 mg by mouth at bedtime.    [provider]  metFORMIN (GLUCOPHAGE-XR) 500 MG 24 hr tablet Take 500 mg by mouth 2 (two) times daily. 08/16/18   [provider]  Multiple Vitamin (MULTIVITAMIN WITH MINERALS) TABS tablet Take 2 tablets by mouth in the morning and at bedtime. Gummies    [provider]  Multiple Vitamins-Minerals (PRESERVISION AREDS 2) CAPS Take 1 capsule by mouth 2 (two) times daily.    [provider]  nabumetone (RELAFEN) 750 MG tablet Take 1 tablet (750 mg total) by mouth 2 (two) times daily as needed. Patient taking differently: Take 750 mg by mouth 2 (two) times daily. 02/26/20   [name], MD  nitroGLYCERIN (NITROSTAT) 0.4 MG SL tablet PLACE 1 TABLET (0.4 MG TOTAL) UNDER THE TONGUE EVERY 5 (FIVE) MINUTES X 3 DOSES AS NEEDED FOR CHEST PAIN. 11/24/19   Jerline Pain, MD  Omega-3 Fatty Acids (FISH OIL TRIPLE STRENGTH PO) Take 57 mg by mouth daily. Chewable    [provider]  Omeprazole 20 MG TBEC Take 20 mg by mouth daily as needed (acid reflux/ indigestion).  03/07/10   [provider]  OVER THE COUNTER MEDICATION Take 1 tablet by mouth 2 (two) times daily. [facility]: glucosamine/cat's claw extract    [provider]  OVER THE  COUNTER MEDICATION Take 30 mg by mouth at bedtime. CBD    [provider]  oxyCODONE (ROXICODONE) 5 MG immediate release tablet Take 1-2 tablets (5-10 mg total) by mouth every 6 (six) hours as needed for severe pain. 03/23/20   [name], MD  Probiotic Product (PROBIOTIC PO) Take 1 tablet by mouth daily.    [provider]  sildenafil (REVATIO) 20 MG tablet Take 40-100 mg by mouth daily as needed (Erectile Dysfunction).     [provider]  tiZANidine (ZANAFLEX) 4 MG tablet Take 1 tablet (4 mg total) by mouth every 8 (eight) hours as needed for muscle spasms. Patient taking differently: Take 4 mg by mouth at bedtime. 02/26/20   [name], MD   Physical Exam: DPOEUM with BMI 03/27/2020 03/23/2020 03/23/2020  Height - - -  Weight - - -  BMI - - -  Systolic 353 614 431  Diastolic 87 81 81  Pulse 70 73 74     1. General:  in No Acute distress   Chronically ill -appearing 2. Psychological: Alert and   Oriented 3. Head/ENT:   Dry Mucous Membranes                          Head Non traumatic, neck supple                            Poor Dentition 4. SKIN:  decreased Skin turgor,  Skin clean Dry and intact no rash 5. Heart: Regular rate and rhythm  no Murmur, no Rub or gallop 6. Lungs no wheezes or crackles   7. Abdomen: Soft, non-tender, Non distended   bowel sounds present 8. Lower extremities: no clubbing, cyanosis, no  Edema, right leg BKA 9. Neurologically Grossly intact, moving all 4 extremities equally   10. MSK: Normal range of motion except for right hip   All other LABS:     Recent Labs  Lab 03/23/20 1228 03/27/20 1716  WBC 6.9 9.1  NEUTROABS  --  7.3  HGB 15.3 14.2  HCT 46.0 41.8  MCV 91.8 92.3  PLT 188  186     Recent Labs  Lab 03/23/20 1228 03/27/20 1716  NA 142 137  K 3.9 3.7  CL 107 103  CO2 19* 24  GLUCOSE 180* 145*  BUN 11 17  CREATININE 0.96 1.05  CALCIUM 9.1 9.1     No results for input(s): AST, ALT,  ALKPHOS, BILITOT, PROT, ALBUMIN in the last 168 hours.     Cultures:    Component Value Date/Time   SDES URINE, RANDOM 07/30/2016 1555   SPECREQUEST NONE 07/30/2016 1555   CULT  07/30/2016 1555    NO GROWTH Performed at [facility], [place] [address] [place], [place] [address]    REPTSTATUS 08/01/2016 FINAL 07/30/2016 1555     Radiological Exams on Admission: DG Hip Unilat With Pelvis 2-3 Views Right  Result Date: 03/27/2020 CLINICAL DATA:  RIGHT hip pain following fall several days ago. Initial encounter. EXAM: DG HIP (WITH OR WITHOUT PELVIS) 2-3V RIGHT COMPARISON:  None. FINDINGS: A RIGHT femoral neck fracture is identified with varus angulation. No dislocation. No other significant bony abnormalities identified. IMPRESSION: RIGHT femoral neck fracture with varus angulation. Electronically Signed   By: Margarette Canada M.D.   On: 03/27/2020 17:12    Chart has been reviewed    Assessment/Plan  70 y.o. male with medical history significant of hypertension hyperlipidemia diabetes type 2 , OSA now off CPAP, sp gastric bypass 2012, GERD HLD, HTN  sp r BKA,  right shoulder   full-thickness rotator cuff tear iron deficiency anemia  Admitted for right hip fracture  Present on Admission: . Hip fracture ([place]) -right hip fracture  - management as per orthopedics,  plan to operate   in  a.m.   Keep nothing by mouth post midnight. Patient not on anticoagulation hold aspirin Ordered type and screen,   order a vitamin D level  Patient at baseline  able to walk a flight of stairs or 100 feet    Patient denies any chest pain or shortness of breath currently and/or with exertion,  CG showing no evidence of acute ischemia unchanged from prior   known history of coronary artery disease stable recently tolerated prior orthopedic surgery  Given advanced age patient is at least moderate  Risk which has been discussed with family but at this point no furthther cardiac workup is indicated.   will  order preop chest x-ray Pain control of IV morphine and Robaxin  HTN -resume lisinopril when able to tolerate  . CAD (coronary artery disease) -resume home medications when able to tolerate   . Chronic diastolic CHF (congestive heart failure) (HCC) mild currently appears to be slightly on the dry side will give gentle IV fluids and monitor.   Mar[place] HYPERLIPIDEMIA, MIXED -resume home medications Lipitor when able to Tolerate  . Hypothyroidism -resume Synthroid when able to tolerate p.o. Check TSH  . Type 2 diabetes mellitus with vascular disease ([place]) -  - Order Sensitive SSI     -  check TSH and HgA1C  - Hold by mouth medications   Status post right BKA stable  Recent right rotator cuff repair -as per orthopedics Other plan as per orders.  DVT prophylaxis:  SCD       Code Status:    Code Status: Prior FULL CODE  as per patient  I had personally discussed CODE STATUS with patient and family    Family Communication:   Family  at  Bedside  plan of care was discussed   with  Wife,   Disposition Plan:  likely will need placement for rehabilitation                           Following barriers for discharge:                                                      Pain controlled with PO medications                                                           Will need to be able to tolerate PO                             Hip fracture surgically repaired                           Will need consultants to evaluate patient prior to discharge                       Would benefit from PT/OT eval prior to DC                                        Transition of care consulted                                      Consults called: Orthopedics is aware  Admission status:  ED Disposition    ED Disposition Condition [place]: [facility] [[id number]]  Level of Care: Med-Surg [16]  May admit patient to [facility] or [facility] if equivalent level of  care is available:: No  Covid Evaluation: Asymptomatic Screening Protocol (No Symptoms)  Diagnosis: Hip fracture [facility]) [[id number]]  Admitting Physician: Toy Baker [[id number]]  Attending Physician: Toy Baker [[id number]]  Estimated length of stay: past midnight tomorrow  Certification:: I certify this patient will need inpatient services for at least 2 midnights          inpatient     I Expect 2 midnight stay secondary to severity of patient's current illness need for inpatient interventions justified by the following:     Severe lab/radiological/exam abnormalities including:   Right hip fracture and extensive comorbidities including:    DM2     CAD    . History of amputation   That are currently affecting medical management.   I expect  patient to be hospitalized for 2 midnights requiring inpatient medical care.  Patient is at high risk for adverse outcome (such as loss of life or disability) if not treated.  Indication for inpatient stay as follows:    severe pain requiring acute inpatient management,    Need for operative/procedural  intervention    Need for   IV fluids, , IV pain medications,      Level of  care    medical floor     Lab Results  Component Value Date   [place] NEGATIVE 03/27/2020     Precautions: admitted as  Covid Negative  PPE: Used by the provider:   N95  eye Goggles,  Gloves    Kaira Stringfield 03/27/2020, 8:42 PM    [facility]     after 2 AM please page floor coverage PA If 7AM-7PM, please contact the day team taking care of the patient using [email or web address]   Patient was evaluated in the context of the global COVID-19 pandemic, which necessitated consideration that the patient might be at risk for infection with the SARS-CoV-2 virus that causes COVID-19. Institutional protocols and algorithms that pertain to the evaluation of patients at risk for COVID-19 are in a state of rapid change based on information released by  regulatory bodies including the CDC and federal and state organizations. These policies and algorithms were followed during the patient's care.

## 2020-03-27 NOTE — ED Notes (Signed)
Report called to Gerald Stabs, RN on 3W

## 2020-03-27 NOTE — ED Notes (Signed)
Attempted report to Gerald Stabs, RN on 3W. Pt has order for cardiac monitoring, and [facility] does not do telemetry. Chris to call [facility]

## 2020-03-27 NOTE — ED Provider Notes (Signed)
[facility] DEPT Provider Note   CSN: [id number] Arrival date & time: 03/27/20  1553     History No chief complaint on file.   [name] is a 70 y.o. male a history of diabetes type 2, iron deficiency anemia, CAD.  Presents with chief complaint of right hip pain.  Reports that his pain began on 03/23/20 after suffering a fall.  Patient reports that he fell landing on his right hip.  Reports pain has been constant since then progressively worsening.  At rest patient rates his pain as 3/10 on the pain scale.  Patient reports that this pain worsens with any movement of his right leg.  With movement patient rates pain 9/10 on pain scale and described as sharp.  Patient reports that he has been unable to wear his prosthetic or pressure on that right leg since his fall.  Patient has been spending most of his time in a recliner chair since the fall.  Patient denies any fevers, chills, swelling, wound, color change, numbness or tingling to extremities, weakness to extremities, neck pain, back pain, saddle anesthesia, bowel or bladder dysfunction.  Dr. [name] is his orthopedic surgeon.  HPI     Past Medical History:  Diagnosis Date  . Anemia    low iron  . Arthritis   . Coronary artery disease 2006   a. remote stenting to ramus, prox LAD x2.  . Dental crowns present   . Diabetes mellitus   . First degree AV block   . GERD (gastroesophageal reflux disease)   . Heart attack ([place]) 06/2004  . Heart murmur    aortic  . Hx MRSA infection 02/2006  . Hyperlipidemia   . Hypertension    hx. of - has not been on med. since losing wt. after gastric bypass  . Hypothyroidism   . Morbid obesity ([place])   . Mucoid cyst of joint 09/2011   left thumb  . Sleep apnea    sleep study 03/29/2011; no CPAP use, lost >130lbs  . Trifascicular block     Patient Active Problem List   Diagnosis Date Noted  . Nontraumatic complete tear of right rotator cuff 02/26/2020  . S/P  transmetatarsal amputation of foot, left ([place]) 04/25/2016  . Chronic osteomyelitis of toe, left ([place])   . Cellulitis of toe of left foot   . Chest pain 09/25/2015  . CAD (coronary artery disease) of artery bypass graft 09/25/2015  . Sinus bradycardia on ECG 09/25/2015  . Chronic diastolic CHF (congestive heart failure) ([place]) 09/25/2015  . Chest pain at rest 09/25/2015  . Aortic valvular disorder 08/04/2014  . Disorder of mitral valve 08/04/2014  . Diabetic foot infection ([place]) 06/03/2014  . Peripheral vascular disease ([place]) 06/03/2014  . Abscess of foot 06/03/2014  . CAD (coronary artery disease) 07/29/2013  . Trifascicular block 07/29/2013  . 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 Hypothyroidism 01/22/2007  . Type 2 diabetes mellitus with vascular disease ([place]) 01/22/2007  . HYPERLIPIDEMIA, MIXED 01/22/2007  . MYOCARDIAL INFARCTION, HX OF 01/22/2007  . DEGENERATIVE JOINT DISEASE 01/22/2007  . ANEMIA, IRON DEFICIENCY, HX OF 01/22/2007    Past Surgical History:  Procedure Laterality Date  . AMPUTATION Left 04/19/2016   Procedure: Left Foot 4th Toe Amputation vs. Transmetatarsal;  Surgeon: [name], MD;  Location: [facility];  Service: Orthopedics;  Laterality: Left;  . BIOPSY  04/07/2019   Procedure: BIOPSY;  Surgeon: Otis Brace, MD;  Location: [facility];  Service: Gastroenterology;;  . CATARACT EXTRACTION  02/2010; 03/2010  . COLONOSCOPY WITH PROPOFOL N/A 09/14/2014   Procedure: COLONOSCOPY WITH PROPOFOL;  Surgeon: Garlan Fair, MD;  Location: [facility];  Service: Endoscopy;  Laterality: N/A;  . COLONOSCOPY WITH PROPOFOL N/A 04/07/2019   Procedure: COLONOSCOPY WITH PROPOFOL;  Surgeon: Otis Brace, MD;  Location: [facility];  Service: Gastroenterology;  Laterality: N/A;  . CORONARY ANGIOPLASTY WITH STENT PLACEMENT  07/12/2004; 08/03/2004   total of 3 stents  . I & D EXTREMITY Right 06/04/2014   Procedure: IRRIGATION AND DEBRIDEMENT EXTREMITY;   Surgeon: [name], MD;  Location: [facility];  Service: Orthopedics;  Laterality: Right;  . MASS EXCISION  10/04/2011   Procedure: EXCISION MASS;  Surgeon: Wynonia Sours, MD;  Location: [facility];  Service: Orthopedics;  Laterality: Left;  excision cyst debridment ip joint of left thumb  . PROSTATECTOMY    . right below knee amputation  2018  . ROUX-EN-Y GASTRIC BYPASS  10/10/2010   laparoscopic  . SHOULDER ARTHROSCOPY WITH ROTATOR CUFF REPAIR AND SUBACROMIAL DECOMPRESSION Right 03/23/2020   Procedure: RIGHT SHOULDER ARTHROSCOPY WITH DEBRIDEMENT AND  ROTATOR CUFF REPAIR;  Surgeon: [name], MD;  Location: [facility];  Service: Orthopedics;  Laterality: Right;  . TOE AMPUTATION  02/23/2006   left foot second ray amputation  . TRANSMETATARSAL AMPUTATION Left   . TRIGGER FINGER RELEASE Right 09/16/2019   Procedure: RELEASE TRIGGER FINGER/A-1 PULLEY;  Surgeon: Daryll Brod, MD;  Location: [facility];  Service: Orthopedics;  Laterality: Right;  IV REGIONAL FOREARM BLOCK       Family History  Problem Relation Age of Onset  . Heart disease Mother     Social History   Tobacco Use  . Smoking status: Never Smoker  . Smokeless tobacco: Never Used  Substance Use Topics  . Alcohol use: No  . Drug use: No    Home Medications Prior to Admission medications   Medication Sig Start Date End Date Taking? Authorizing Provider  aspirin EC 81 MG tablet Take 81 mg by mouth daily.    [provider]  atorvastatin (LIPITOR) 10 MG tablet Take 10 mg by mouth at bedtime.     [provider]  CALCIUM CITRATE PO Take 500 mg by mouth 3 (three) times daily.    [provider]  Cyanocobalamin (B-12) 1000 MCG SUBL Place 1,000 mcg under the tongue every Monday, Wednesday, and Friday.    [provider]  ferrous sulfate 325 (65 FE) MG tablet Take 325 mg by mouth daily.     [provider]  fluticasone (FLONASE) 50 MCG/ACT  nasal spray Place 2 sprays into both nostrils daily as needed for allergies.     [provider]  HYDROcodone-acetaminophen (NORCO/VICODIN) 5-325 MG tablet Take 1 tablet by mouth every 6 (six) hours as needed for moderate pain. Patient taking differently: Take 1 tablet by mouth at bedtime as needed for moderate pain. 02/20/20   [name], MD  Hypromellose (GENTEAL SEVERE OP) Place 1 drop into the right eye daily as needed (Dry eyes).    [provider]  ibuprofen (ADVIL,MOTRIN) 200 MG tablet Take 400 mg by mouth every 6 (six) hours as needed for headache (pain).    [provider]  levothyroxine (SYNTHROID) 150 MCG tablet Take 150 mcg by mouth daily before breakfast.    [provider]  lisinopril (ZESTRIL) 2.5 MG tablet Take 1 tablet (2.5 mg total) by mouth daily.  12/22/19   Jerline Pain, MD  Melatonin 10 MG TABS Take 10 mg by mouth at bedtime.    [provider]  metFORMIN (GLUCOPHAGE-XR) 500 MG 24 hr tablet Take 500 mg by mouth 2 (two) times daily. 08/16/18   [provider]  Multiple Vitamin (MULTIVITAMIN WITH MINERALS) TABS tablet Take 2 tablets by mouth in the morning and at bedtime. Gummies    [provider]  Multiple Vitamins-Minerals (PRESERVISION AREDS 2) CAPS Take 1 capsule by mouth 2 (two) times daily.    [provider]  nabumetone (RELAFEN) 750 MG tablet Take 1 tablet (750 mg total) by mouth 2 (two) times daily as needed. Patient taking differently: Take 750 mg by mouth 2 (two) times daily. 02/26/20   [name], MD  nitroGLYCERIN (NITROSTAT) 0.4 MG SL tablet PLACE 1 TABLET (0.4 MG TOTAL) UNDER THE TONGUE EVERY 5 (FIVE) MINUTES X 3 DOSES AS NEEDED FOR CHEST PAIN. 11/24/19   Jerline Pain, MD  Omega-3 Fatty Acids (FISH OIL TRIPLE STRENGTH PO) Take 57 mg by mouth daily. Chewable    [provider]  Omeprazole 20 MG TBEC Take 20 mg by mouth daily as needed (acid reflux/ indigestion).   03/07/10   [provider]  OVER THE COUNTER MEDICATION Take 1 tablet by mouth 2 (two) times daily. [facility] Complex: glucosamine/cat's claw extract    [provider]  OVER THE COUNTER MEDICATION Take 30 mg by mouth at bedtime. CBD    [provider]  oxyCODONE (ROXICODONE) 5 MG immediate release tablet Take 1-2 tablets (5-10 mg total) by mouth every 6 (six) hours as needed for severe pain. 03/23/20   [name], MD  Probiotic Product (PROBIOTIC PO) Take 1 tablet by mouth daily.    [provider]  sildenafil (REVATIO) 20 MG tablet Take 40-100 mg by mouth daily as needed (Erectile Dysfunction).     [provider]  tiZANidine (ZANAFLEX) 4 MG tablet Take 1 tablet (4 mg total) by mouth every 8 (eight) hours as needed for muscle spasms. Patient taking differently: Take 4 mg by mouth at bedtime. 02/26/20   [name], MD    Allergies    Bactrim [sulfamethoxazole-trimethoprim], Moxifloxacin, Penicillins, and Quinolones  Review of Systems   Review of Systems  Constitutional: Negative for chills and fever.  Musculoskeletal: Positive for arthralgias. Negative for back pain, myalgias and neck pain.  Skin: Negative for color change, pallor, rash and wound.  Neurological: Negative for dizziness, tremors, seizures, syncope, facial asymmetry, speech difficulty, weakness, light-headedness, numbness and headaches.    Physical Exam Updated Vital Signs BP (!) 166/87 (BP Location: Right Arm)   Pulse 70   Temp 98.3 F (36.8 C) (Oral)   Resp 18   SpO2 98%   Physical Exam Vitals and nursing note reviewed.  Constitutional:      General: He is not in acute distress.    Appearance: He is not ill-appearing, toxic-appearing or diaphoretic.  HENT:     Head: Normocephalic and atraumatic. No raccoon eyes, Battle's sign, abrasion, contusion, masses, right periorbital erythema, left periorbital erythema or laceration.     Jaw: No  trismus or pain on movement.     Mouth/Throat:     Pharynx: Uvula midline.  Eyes:     General: No scleral icterus.       Right eye: No discharge.        Left eye: No discharge.     Extraocular Movements: Extraocular  movements intact.     Pupils: Pupils are equal, round, and reactive to light.  Cardiovascular:     Rate and Rhythm: Normal rate.  Pulmonary:     Effort: Pulmonary effort is normal. No respiratory distress.     Breath sounds: No stridor.  Abdominal:     Palpations: Abdomen is soft.     Tenderness: There is no abdominal tenderness.  Musculoskeletal:     Cervical back: Normal range of motion and neck supple. No swelling, edema, deformity, erythema, signs of trauma, lacerations, rigidity, spasms, torticollis, tenderness, bony tenderness or crepitus. No pain with movement. Normal range of motion.     Thoracic back: No swelling, edema, deformity, signs of trauma, tenderness or bony tenderness.     Lumbar back: No swelling, edema, deformity, signs of trauma, tenderness or bony tenderness.     Right hip: Tenderness and bony tenderness present. No deformity or lacerations. Decreased range of motion.     Left hip: No deformity, lacerations, tenderness, bony tenderness or crepitus.     Right upper leg: Normal.     Left upper leg: Normal.     Right knee: No swelling, deformity, effusion, erythema, ecchymosis, lacerations, bony tenderness or crepitus. Normal range of motion. No tenderness.     Left knee: No swelling, deformity, effusion, erythema, ecchymosis, lacerations, bony tenderness or crepitus. Normal range of motion. No tenderness.     Left lower leg: Normal.     Left ankle: No swelling, deformity, ecchymosis or lacerations. No tenderness. Normal range of motion. Normal pulse.     Comments: Right arm is in sling due to recent orthopedic surgery  Bony tenderness to right hip     Right Lower Extremity: Right leg is amputated below knee.  Skin:    General: Skin is warm and dry.   Neurological:     General: No focal deficit present.     Mental Status: He is alert.     GCS: GCS eye subscore is 4. GCS verbal subscore is 5. GCS motor subscore is 6.     Cranial Nerves: No cranial nerve deficit or facial asymmetry.     Comments: CN II-XII intact; performed in supine position  Sensation intact to bilateral lower extremities  Able to lift and hold left lower extremity against gravity without difficulty    Psychiatric:        Behavior: Behavior is cooperative.     ED Results / Procedures / Treatments   Labs (all labs ordered are listed, but only abnormal results are displayed) Labs Reviewed  BASIC METABOLIC PANEL - Abnormal; Notable for the following components:      Result Value   Glucose, Bld 145 (*)    All other components within normal limits  RESP PANEL BY RT-PCR (FLU A&B, COVID) ARPGX2  CBC WITH DIFFERENTIAL/PLATELET  PROTIME-INR  APTT  TYPE AND SCREEN    EKG None  Radiology DG Hip Unilat With Pelvis 2-3 Views Right  Result Date: 03/27/2020 CLINICAL DATA:  RIGHT hip pain following fall several days ago. Initial encounter. EXAM: DG HIP (WITH OR WITHOUT PELVIS) 2-3V RIGHT COMPARISON:  None. FINDINGS: A RIGHT femoral neck fracture is identified with varus angulation. No dislocation. No other significant bony abnormalities identified. IMPRESSION: RIGHT femoral neck fracture with varus angulation. Electronically Signed   By: Margarette Canada M.D.   On: 03/27/2020 17:12    Procedures Procedures   Medications Ordered in ED Medications  fentaNYL (SUBLIMAZE) injection 50 mcg (has no administration in time range)  ED Course  I have reviewed the triage vital signs and the nursing notes.  Pertinent labs & imaging results that were available during my care of the patient were reviewed by me and considered in my medical decision making (see chart for details).    MDM Rules/Calculators/A&P                          Alert 70 year old male no acute  distress, nontoxic-appearing.  Patient presents with complaint of right hip pain after suffering a fall.  Patient has a history of right BKA.  Patient has not been able to wear his prosthesis since his fall.  Patient has not been able to put pressure on his right leg since Friday.  Patient complains of increased pain with movement of right leg.  Patient's orthopedic surgeon is Dr. [name].    He has bony tenderness to right hip, decreased range of motion and pain on movement.  Will obtain x-ray of right hip and pelvis.  X-ray showed RIGHT femoral neck fracture with varus angulation.  Will consult orthopedic surgery.  1750 Spoke with Dr.Magnant who will see the patient for consult.  He advised to have medical team admit.  Requested that patient not be placed on Lovenox.  1910 spoke with Dr. Roel Cluck with hospitalist team who will see the patient for admission.  Patient was discussed with and evaluated by Dr. Maryan Rued.    Final Clinical Impression(s) / ED Diagnoses Final diagnoses:  Closed fracture of neck of right femur, initial encounter [facility]    Rx / DC Orders ED Discharge Orders    None       Dyann Ruddle 03/28/20 0036    Blanchie Dessert, MD 03/28/20 2215

## 2020-03-28 DIAGNOSIS — I2510 Atherosclerotic heart disease of native coronary artery without angina pectoris: Secondary | ICD-10-CM | POA: Diagnosis not present

## 2020-03-28 DIAGNOSIS — S72001A Fracture of unspecified part of neck of right femur, initial encounter for closed fracture: Secondary | ICD-10-CM | POA: Diagnosis not present

## 2020-03-28 DIAGNOSIS — E039 Hypothyroidism, unspecified: Secondary | ICD-10-CM | POA: Diagnosis not present

## 2020-03-28 DIAGNOSIS — I5032 Chronic diastolic (congestive) heart failure: Secondary | ICD-10-CM | POA: Diagnosis not present

## 2020-03-28 LAB — COMPREHENSIVE METABOLIC PANEL
ALT: 17 U/L (ref 0–44)
AST: 18 U/L (ref 15–41)
Albumin: 3.5 g/dL (ref 3.5–5.0)
Alkaline Phosphatase: 46 U/L (ref 38–126)
Anion gap: 10 (ref 5–15)
BUN: 17 mg/dL (ref 8–23)
CO2: 24 mmol/L (ref 22–32)
Calcium: 8.8 mg/dL — ABNORMAL LOW (ref 8.9–10.3)
Chloride: 105 mmol/L (ref 98–111)
Creatinine, Ser: 0.79 mg/dL (ref 0.61–1.24)
GFR, Estimated: >60 mL/min (ref >60)
Glucose, Bld: 107 mg/dL — ABNORMAL HIGH (ref 70–99)
Potassium: 3.5 mmol/L (ref 3.5–5.1)
Sodium: 139 mmol/L (ref 135–145)
Total Bilirubin: 1.2 mg/dL (ref 0.3–1.2)
Total Protein: 6.5 g/dL (ref 6.5–8.1)

## 2020-03-28 LAB — CBC
HCT: 41.2 % (ref 39.0–52.0)
Hemoglobin: 13.9 g/dL (ref 13.0–17.0)
MCH: 31.2 pg (ref 26.0–34.0)
MCHC: 33.7 g/dL (ref 30.0–36.0)
MCV: 92.6 fL (ref 80.0–100.0)
Platelets: 194 10*3/uL (ref 150–400)
RBC: 4.45 MIL/uL (ref 4.22–5.81)
RDW: 13.8 % (ref 11.5–15.5)
WBC: 7.7 10*3/uL (ref 4.0–10.5)
nRBC: 0 % (ref 0.0–0.2)

## 2020-03-28 LAB — HEMOGLOBIN A1C
Hgb A1c MFr Bld: 6.8 % — ABNORMAL HIGH (ref 4.8–5.6)
Mean Plasma Glucose: 148.46 mg/dL

## 2020-03-28 LAB — VITAMIN D 25 HYDROXY (VIT D DEFICIENCY, FRACTURES): Vit D, 25-Hydroxy: 53.03 ng/mL (ref 30–100)

## 2020-03-28 LAB — GLUCOSE, CAPILLARY
Glucose-Capillary: 114 mg/dL — ABNORMAL HIGH (ref 70–99)
Glucose-Capillary: 143 mg/dL — ABNORMAL HIGH (ref 70–99)
Glucose-Capillary: 150 mg/dL — ABNORMAL HIGH (ref 70–99)
Glucose-Capillary: 211 mg/dL — ABNORMAL HIGH (ref 70–99)
Glucose-Capillary: 196 mg/dL — ABNORMAL HIGH (ref 70–99)

## 2020-03-28 LAB — SURGICAL PCR SCREEN
MRSA, PCR: NEGATIVE
Staphylococcus aureus: NEGATIVE

## 2020-03-28 LAB — ALBUMIN: Albumin: 3.5 g/dL (ref 3.5–5.0)

## 2020-03-28 MED ORDER — TRANEXAMIC ACID 1000 MG/10ML IV SOLN
2000.0000 mg | INTRAVENOUS | Status: AC
  Filled 2020-03-28: qty 20

## 2020-03-28 MED ORDER — TRANEXAMIC ACID-NACL 1000-0.7 MG/100ML-% IV SOLN
1000.0000 mg | INTRAVENOUS | Status: AC
  Administered 2020-03-29: 1000 mg via INTRAVENOUS
  Filled 2020-03-28: qty 100

## 2020-03-28 MED ORDER — ENOXAPARIN SODIUM 40 MG/0.4ML ~~LOC~~ SOLN: 40.0000 mg | Freq: Every day | SUBCUTANEOUS | 0 refills | Status: DC

## 2020-03-28 MED ORDER — OXYCODONE-ACETAMINOPHEN 5-325 MG PO TABS: 1.0000 | ORAL_TABLET | Freq: Three times a day (TID) | ORAL | 0 refills | Status: DC | PRN

## 2020-03-28 MED ORDER — CEFAZOLIN SODIUM-DEXTROSE 2-4 GM/100ML-% IV SOLN
2.0000 g | INTRAVENOUS | Status: AC
  Administered 2020-03-29: 2 g via INTRAVENOUS
  Filled 2020-03-28: qty 100

## 2020-03-28 MED ORDER — POVIDONE-IODINE 10 % EX SWAB: 2.0000 "application " | Freq: Once | CUTANEOUS | Status: DC

## 2020-03-28 NOTE — Plan of Care (Signed)

## 2020-03-28 NOTE — Progress Notes (Signed)
Pt arrived to [facility] via [facility]. Wife at bedside. Pt assessed and given pain meds. Call bell and room phone within reach. Pt aware of planned surgery tomorrow and NPO at midnight.

## 2020-03-28 NOTE — Consult Note (Signed)
ORTHOPAEDIC CONSULTATION  REQUESTING PHYSICIAN: Darliss Cheney, MD  Chief Complaint: Right femoral neck fracture  HPI: [name] is a very pleasant and active 70 year old male who suffered a mechanical fall the night of his right shoulder surgery just a couple days by Dr. [name].  He was hoping that the pain would get better but unfortunately he did not notice any improvement in his hip pain and was unable to walk.  He is status post BKA on the right side a few years ago by Dr. Nicki Reaper at [facility].  He denies any other complaints.  Ortho consulted for surgical evaluation.  Past Medical History:  Diagnosis Date  . Anemia    low iron  . Arthritis   . Coronary artery disease 2006   a. remote stenting to ramus, prox LAD x2.  . Dental crowns present   . Diabetes mellitus   . First degree AV block   . GERD (gastroesophageal reflux disease)   . Heart attack ([place]) 06/2004  . Heart murmur    aortic  . Hx MRSA infection 02/2006  . Hyperlipidemia   . Hypertension    hx. of - has not been on med. since losing wt. after gastric bypass  . Hypothyroidism   . Morbid obesity ([place])   . Mucoid cyst of joint 09/2011   left thumb  . Sleep apnea    sleep study 03/29/2011; no CPAP use, lost >130lbs  . Trifascicular block    Past Surgical History:  Procedure Laterality Date  . AMPUTATION Left 04/19/2016   Procedure: Left Foot 4th Toe Amputation vs. Transmetatarsal;  Surgeon: [name], MD;  Location: [facility];  Service: Orthopedics;  Laterality: Left;  . BIOPSY  04/07/2019   Procedure: BIOPSY;  Surgeon: Otis Brace, MD;  Location: [facility];  Service: Gastroenterology;;  . CATARACT EXTRACTION  02/2010; 03/2010  . COLONOSCOPY WITH PROPOFOL N/A 09/14/2014   Procedure: COLONOSCOPY WITH PROPOFOL;  Surgeon: Garlan Fair, MD;  Location: [facility];  Service: Endoscopy;  Laterality: N/A;  . COLONOSCOPY WITH PROPOFOL N/A 04/07/2019   Procedure: COLONOSCOPY WITH PROPOFOL;  Surgeon:  Otis Brace, MD;  Location[facility];  Service: Gastroenterology;  Laterality: N/A;  . CORONARY ANGIOPLASTY WITH STENT PLACEMENT  07/12/2004; 08/03/2004   total of 3 stents  . I & D EXTREMITY Right 06/04/2014   Procedure: IRRIGATION AND DEBRIDEMENT EXTREMITY;  Surgeon: [name], MD;  Location: [facility];  Service: Orthopedics;  Laterality: Right;  . MASS EXCISION  10/04/2011   Procedure: EXCISION MASS;  Surgeon: Wynonia Sours, MD;  Location: [facility];  Service: Orthopedics;  Laterality: Left;  excision cyst debridment ip joint of left thumb  . PROSTATECTOMY    . right below knee amputation  2018  . ROUX-EN-Y GASTRIC BYPASS  10/10/2010   laparoscopic  . SHOULDER ARTHROSCOPY WITH ROTATOR CUFF REPAIR AND SUBACROMIAL DECOMPRESSION Right 03/23/2020   Procedure: RIGHT SHOULDER ARTHROSCOPY WITH DEBRIDEMENT AND  ROTATOR CUFF REPAIR;  Surgeon: [name], MD;  Location: [facility];  Service: Orthopedics;  Laterality: Right;  . TOE AMPUTATION  02/23/2006   left foot second ray amputation  . TRANSMETATARSAL AMPUTATION Left   . TRIGGER FINGER RELEASE Right 09/16/2019   Procedure: RELEASE TRIGGER FINGER/A-1 PULLEY;  Surgeon: Daryll Brod, MD;  Location: [facility];  Service: Orthopedics;  Laterality: Right;  IV REGIONAL FOREARM BLOCK   Social History   Socioeconomic History  . Marital status: Married  Spouse name: Not on file  . Number of children: Not on file  . Years of education: Not on file  . Highest education level: Not on file  Occupational History  . Occupation: Engineer, maintenance (IT) taxes  Tobacco Use  . Smoking status: Never Smoker  . Smokeless tobacco: Never Used  Substance and Sexual Activity  . Alcohol use: No  . Drug use: No  . Sexual activity: Yes  Other Topics Concern  . Not on file  Social History Narrative   Parents and sister all using CPAP   Social Determinants of Health   Financial Resource Strain: Not on file  Food Insecurity: Not  on file  Transportation Needs: Not on file  Physical Activity: Not on file  Stress: Not on file  Social Connections: Not on file   Family History  Problem Relation Age of Onset  . Heart disease Mother    Allergies  Allergen Reactions  . Bactrim [Sulfamethoxazole-Trimethoprim] Itching and Rash  . Moxifloxacin Rash    Avelox Rash at injection site  . Penicillins Rash     Has patient had a PCN reaction causing immediate rash, facial/tongue/throat swelling, SOB or lightheadedness with hypotension: #  #  #  YES  #  #  #  Has patient had a PCN reaction causing severe rash involving mucus membranes or skin necrosis: No Has patient had a PCN reaction that required hospitalization unknown Has patient had a PCN reaction occurring within the last 10 years: No If all of the above answers are "NO", then may proceed with Cephalosporin use.  . Quinolones Itching   Prior to Admission medications   Medication Sig Start Date End Date Taking? Authorizing Provider  aspirin EC 81 MG tablet Take 81 mg by mouth daily.   Yes [provider]  atorvastatin (LIPITOR) 10 MG tablet Take 10 mg by mouth at bedtime.    Yes [provider]  CALCIUM CITRATE PO Take 500 mg by mouth 3 (three) times daily.   Yes [provider]  Cyanocobalamin (B-12) 1000 MCG SUBL Place 1,000 mcg under the tongue every Monday, Wednesday, and Friday.   Yes [provider]  ferrous sulfate 325 (65 FE) MG tablet Take 325 mg by mouth daily.    Yes [provider]  fluticasone (FLONASE) 50 MCG/ACT nasal spray Place 2 sprays into both nostrils daily as needed for allergies.    Yes [provider]  ibuprofen (ADVIL,MOTRIN) 200 MG tablet Take 400 mg by mouth every 6 (six) hours as needed for headache (pain).   Yes [provider]  levothyroxine (SYNTHROID) 150 MCG tablet Take 150 mcg by mouth daily before breakfast.   Yes [provider]  lisinopril (ZESTRIL) 2.5 MG  tablet Take 1 tablet (2.5 mg total) by mouth daily. 12/22/19  Yes Jerline Pain, MD  Melatonin 10 MG TABS Take 10 mg by mouth at bedtime.   Yes [provider]  metFORMIN (GLUCOPHAGE-XR) 500 MG 24 hr tablet Take 500 mg by mouth 2 (two) times daily. 08/16/18  Yes [provider]  Multiple Vitamin (MULTIVITAMIN WITH MINERALS) TABS tablet Take 2 tablets by mouth in the morning and at bedtime. Gummies   Yes [provider]  Multiple Vitamins-Minerals (PRESERVISION AREDS 2) CAPS Take 1 capsule by mouth 2 (two) times daily.   Yes [provider]  nabumetone (RELAFEN) 750 MG tablet Take 1 tablet (750 mg total) by mouth 2 (two) times daily as needed. Patient taking differently: Take 750 mg  by mouth 2 (two) times daily. 02/26/20  Yes [name], MD  Omega-3 Fatty Acids (FISH OIL TRIPLE STRENGTH PO) Take 57 mg by mouth daily. Chewable   Yes [provider]  Omeprazole 20 MG TBEC Take 20 mg by mouth daily as needed (acid reflux/ indigestion).  03/07/10  Yes [provider]  OVER THE COUNTER MEDICATION Take 1 tablet by mouth 2 (two) times daily. [facility]: glucosamine/cat's claw extract   Yes [provider]  OVER THE COUNTER MEDICATION Take 30 mg by mouth at bedtime. CBD   Yes [provider]  oxyCODONE (ROXICODONE) 5 MG immediate release tablet Take 1-2 tablets (5-10 mg total) by mouth every 6 (six) hours as needed for severe pain. 03/23/20  Yes [name], MD  Probiotic Product (PROBIOTIC PO) Take 1 tablet by mouth every evening.   Yes [provider]  tiZANidine (ZANAFLEX) 4 MG tablet Take 1 tablet (4 mg total) by mouth every 8 (eight) hours as needed for muscle spasms. Patient taking differently: Take 4 mg by mouth at bedtime as needed for muscle spasms. 02/26/20  Yes [name], MD  Hypromellose (GENTEAL SEVERE OP) Place 1 drop into the right eye daily as needed (Dry eyes).     [provider]  nitroGLYCERIN (NITROSTAT) 0.4 MG SL tablet PLACE 1 TABLET (0.4 MG TOTAL) UNDER THE TONGUE EVERY 5 (FIVE) MINUTES X 3 DOSES AS NEEDED FOR CHEST PAIN. 11/24/19   Jerline Pain, MD  sildenafil (REVATIO) 20 MG tablet Take 40-100 mg by mouth daily as needed (Erectile Dysfunction).     [provider]   DG CHEST PORT 1 VIEW  Result Date: 03/27/2020 CLINICAL DATA:  Preop cardiovascular exam.  Known left hip fracture. EXAM: PORTABLE CHEST 1 VIEW COMPARISON:  Chest radiograph 07/30/2016 FINDINGS: Upper normal heart size.The cardiomediastinal contours are normal. The lungs are clear. Pulmonary vasculature is normal. No consolidation, pleural effusion, or pneumothorax. No acute osseous abnormalities are seen. IMPRESSION: No acute chest findings. Electronically Signed   By: Keith Rake M.D.   On: 03/27/2020 21:17   DG Hip Unilat With Pelvis 2-3 Views Right  Result Date: 03/27/2020 CLINICAL DATA:  RIGHT hip pain following fall several days ago. Initial encounter. EXAM: DG HIP (WITH OR WITHOUT PELVIS) 2-3V RIGHT COMPARISON:  None. FINDINGS: A RIGHT femoral neck fracture is identified with varus angulation. No dislocation. No other significant bony abnormalities identified. IMPRESSION: RIGHT femoral neck fracture with varus angulation. Electronically Signed   By: Margarette Canada M.D.   On: 03/27/2020 17:12    All pertinent xrays, MRI, CT independently reviewed and interpreted  Positive ROS: All other systems have been reviewed and were otherwise negative with the exception of those mentioned in the HPI and as above.  Physical Exam: General: No acute distress Cardiovascular: No pedal edema Respiratory: No cyanosis, no use of accessory musculature GI: No organomegaly, abdomen is soft and non-tender Skin: No lesions in the area of chief complaint Neurologic: Sensation intact distally Psychiatric: Patient is at baseline mood and affect Lymphatic: No axillary or cervical  lymphadenopathy  MUSCULOSKELETAL:  - Pain with any attempted range of motion of the hip - skin intact - Status post below the knee amputation with a fully healed surgical scar  Assessment: Right femoral neck fracture  Plan: - surgical treatment is recommended for pain relief, quality of life and early mobilization - patient and family are aware of r/b/a and wish to proceed, informed consent obtained -  medical optimization per primary team - surgery is planned for tomorrow afternoon -N.p.o. after midnight.  Thank you for the consult and the opportunity to see Mr. [name] Eduard Roux, MD [facility] 4:18 PM

## 2020-03-28 NOTE — Progress Notes (Signed)
PROGRESS NOTE    [name]  UOH:[id number] DOB: 12/16/1950 DOA: 03/27/2020 PCP: [name], MD  Outpatient Specialists:   Brief Narrative:  Patient is a 70 year old Caucasian male with past medical history significant for hypertension, hyperlipidemia, diabetes mellitus type 2, OSA off of CPAP, status post gastric bypass in 2012, GERD, status post right BKA secondary to complications of diabetes mellitus, recent right shoulder surgery for full-thickness rotator cuff tear (within the last week) and iron deficiency anemia.  Patient was discharged from the hospital on Tuesday after rotator cuff surgery.  Patient fell at home on the same day of discharge.  Right hip pain gradually got worse.  Patient eventually presented to the hospital and was found to have right hip fracture.  Orthopedics plans to proceed with surgery tomorrow, Monday, 03/29/2020.  Patient will be transferred to [facility] for surgery.   Assessment & Plan:   Active Problems:   Hypothyroidism   Type 2 diabetes mellitus with vascular disease (HCC)   HYPERLIPIDEMIA, MIXED   ANEMIA, IRON DEFICIENCY, HX OF   CAD (coronary artery disease)   Chronic diastolic CHF (congestive heart failure) (HCC)   S/P transmetatarsal amputation of foot, left (HCC)   Hip fracture ([place])  .  Right hip fracture: -Orthopedic team is directing management. -Patient will be transferred to [facility] for surgery tomorrow. -Right hip pain is currently well controlled at the moment.    Hypertension: -Continue to optimize. -Goal blood pressure should be less than 130/80 mmHg.    . CAD: -Stable.  No chest pain reported.     . Chronic diastolic CHF (congestive heart failure): -Stable.  No symptoms.    Marland Kitchen HYPERLIPIDEMIA, MIXED: -Resume home medications Lipitor when able to Tolerate  . Hypothyroidism -resume Synthroid when able to tolerate p.o. Follow TSH.    . Type 2 diabetes mellitus with vascular disease ([place]) -  - Order  Sensitive SSI     Status post right BKA stable  Recent right rotator cuff repair -as per orthopedics  DVT prophylaxis: SCD. Code Status: Full code. Family Communication:  Disposition Plan: This will depend on hospital course.  Consultants:   Orthopedic surgery  Procedures:   None  Antimicrobials:   None   Subjective: No chest pain. No shortness of breath. Right hip pain is controlled.  Objective: Vitals:   03/27/20 2352 03/28/20 0342 03/28/20 1315 03/28/20 1410  BP: 129/75 126/78 119/81 (!) 144/81  Pulse: 78 (!) 59 66 63  Resp: 18 15 18 17   Temp: 99 F (37.2 C) 98.1 F (36.7 C) 98 F (36.7 C) 98.3 F (36.8 C)  TempSrc: Oral Oral Oral Oral  SpO2: 92% 96% 99% 97%  Weight:      Height:        Intake/Output Summary (Last 24 hours) at 03/28/2020 1415 Last data filed at 03/28/2020 1303 Gross per 24 hour  Intake 840 ml  Output 2000 ml  Net -1160 ml   Filed Weights   03/27/20 2100  Weight: 111.1 kg    Examination:  General exam: Appears calm and comfortable.  Right upper extremity is in a sling.  Patient is status post right BKA. Respiratory system: Clear to auscultation.  Cardiovascular system: S1 & S2 heard. Gastrointestinal system: Abdomen is nondistended, soft and nontender. No organomegaly or masses felt. Normal bowel sounds heard. Central nervous system: Alert and oriented.  Patient moves all extremities.   Extremities: Right BKA.  No leg edema.  Data Reviewed: I have personally reviewed following  labs and imaging studies  CBC: Recent Labs  Lab 03/23/20 1228 03/27/20 1716 03/28/20 0256  WBC 6.9 9.1 7.7  NEUTROABS  --  7.3  --   HGB 15.3 14.2 13.9  HCT 46.0 41.8 41.2  MCV 91.8 92.3 92.6  PLT 188 186 676   Basic Metabolic Panel: Recent Labs  Lab 03/23/20 1228 03/27/20 1716 03/28/20 0256  NA 142 137 139  K 3.9 3.7 3.5  CL 107 103 105  CO2 19* 24 24  GLUCOSE 180* 145* 107*  BUN 11 17 17   CREATININE 0.96 1.05 0.79  CALCIUM 9.1 9.1  8.8*   GFR: Estimated Creatinine Clearance: 115 mL/min (by C-G formula based on SCr of 0.79 mg/dL). Liver Function Tests: Recent Labs  Lab 03/27/20 1716 03/28/20 0256  AST 21 18  ALT 18 17  ALKPHOS 49 46  BILITOT 1.4* 1.2  PROT 6.7 6.5  ALBUMIN 3.7 3.5  3.5   No results for input(s): LIPASE, AMYLASE in the last 168 hours. No results for input(s): AMMONIA in the last 168 hours. Coagulation Profile: Recent Labs  Lab 03/27/20 1716  INR 1.1   Cardiac Enzymes: No results for input(s): CKTOTAL, CKMB, CKMBINDEX, TROPONINI in the last 168 hours. BNP (last 3 results) No results for input(s): PROBNP in the last 8760 hours. HbA1C: Recent Labs    03/28/20 0256  HGBA1C 6.8*   CBG: Recent Labs  Lab 03/23/20 1701 03/27/20 2350 03/28/20 0339 03/28/20 0725 03/28/20 1142  GLUCAP 152* 182* 114* 143* 150*   Lipid Profile: No results for input(s): CHOL, HDL, LDLCALC, TRIG, CHOLHDL, LDLDIRECT in the last 72 hours. Thyroid Function Tests: No results for input(s): TSH, T4TOTAL, FREET4, T3FREE, THYROIDAB in the last 72 hours. Anemia Panel: No results for input(s): VITAMINB12, FOLATE, FERRITIN, TIBC, IRON, RETICCTPCT in the last 72 hours. Urine analysis:    Component Value Date/Time   COLORURINE ORANGE (A) 07/30/2016 1555   APPEARANCEUR CLEAR 07/30/2016 1555   LABSPEC 1.041 (H) 07/30/2016 1555   PHURINE 6.0 07/30/2016 1555   GLUCOSEU NEGATIVE 07/30/2016 1555   HGBUR NEGATIVE 07/30/2016 1555   BILIRUBINUR SMALL (A) 07/30/2016 1555   KETONESUR 40 (A) 07/30/2016 1555   PROTEINUR 30 (A) 07/30/2016 1555   NITRITE NEGATIVE 07/30/2016 1555   LEUKOCYTESUR TRACE (A) 07/30/2016 1555   Sepsis Labs: @LABRCNTIP (procalcitonin:4,lacticidven:4)  ) Recent Results (from the past 240 hour(s))  SARS CORONAVIRUS 2 (TAT 6-24 HRS) Nasopharyngeal Nasopharyngeal Swab     Status: None   Collection Time: 03/20/20  9:59 AM   Specimen: Nasopharyngeal Swab  Result Value Ref Range Status   SARS  Coronavirus 2 NEGATIVE NEGATIVE Final    Comment: (NOTE) SARS-CoV-2 target nucleic acids are NOT DETECTED.  The SARS-CoV-2 RNA is generally detectable in upper and lower respiratory specimens during the acute phase of infection. Negative results do not preclude SARS-CoV-2 infection, do not rule out co-infections with other pathogens, and should not be used as the sole basis for treatment or other patient management decisions. Negative results must be combined with clinical observations, patient history, and epidemiological information. The expected result is Negative.  Fact Sheet for Patients: SugarRoll.be  Fact Sheet for Healthcare Providers: https://www.woods-mathews.com/  This test is not yet approved or cleared by the [place] FDA and  has been authorized for detection and/or diagnosis of SARS-CoV-2 by FDA under an Emergency Use Authorization (EUA). This EUA will remain  in effect (meaning this test can be used) for the duration of the COVID-19 declaration under Se ction  564(b)(1) of the Act, 21 U.S.C. section 360bbb-3(b)(1), unless the authorization is terminated or revoked sooner.  Performed at [facility], [place] [address], [place], [place] [address]   Resp Panel by RT-PCR (Flu A&B, Covid) Nasopharyngeal Swab     Status: None   Collection Time: 03/27/20  5:16 PM   Specimen: Nasopharyngeal Swab; Nasopharyngeal(NP) swabs in vial transport medium  Result Value Ref Range Status   SARS Coronavirus 2 by RT PCR NEGATIVE NEGATIVE Final    Comment: (NOTE) SARS-CoV-2 target nucleic acids are NOT DETECTED.  The SARS-CoV-2 RNA is generally detectable in upper respiratory specimens during the acute phase of infection. The lowest concentration of SARS-CoV-2 viral copies this assay can detect is 138 copies/mL. A negative result does not preclude SARS-Cov-2 infection and should not be used as the sole basis for treatment or other  patient management decisions. A negative result may occur with  improper specimen collection/handling, submission of specimen other than nasopharyngeal swab, presence of viral mutation(s) within the areas targeted by this assay, and inadequate number of viral copies(<138 copies/mL). A negative result must be combined with clinical observations, patient history, and epidemiological information. The expected result is Negative.  Fact Sheet for Patients:  EntrepreneurPulse.com.au  Fact Sheet for Healthcare Providers:  IncredibleEmployment.be  This test is no t yet approved or cleared by the [place] FDA and  has been authorized for detection and/or diagnosis of SARS-CoV-2 by FDA under an Emergency Use Authorization (EUA). This EUA will remain  in effect (meaning this test can be used) for the duration of the COVID-19 declaration under Section 564(b)(1) of the Act, 21 U.S.C.section 360bbb-3(b)(1), unless the authorization is terminated  or revoked sooner.       Influenza A by PCR NEGATIVE NEGATIVE Final   Influenza B by PCR NEGATIVE NEGATIVE Final    Comment: (NOTE) The Xpert Xpress SARS-CoV-2/FLU/RSV plus assay is intended as an aid in the diagnosis of influenza from Nasopharyngeal swab specimens and should not be used as a sole basis for treatment. Nasal washings and aspirates are unacceptable for Xpert Xpress SARS-CoV-2/FLU/RSV testing.  Fact Sheet for Patients: EntrepreneurPulse.com.au  Fact Sheet for Healthcare Providers: IncredibleEmployment.be  This test is not yet approved or cleared by the [place] FDA and has been authorized for detection and/or diagnosis of SARS-CoV-2 by FDA under an Emergency Use Authorization (EUA). This EUA will remain in effect (meaning this test can be used) for the duration of the COVID-19 declaration under Section 564(b)(1) of the Act, 21 U.S.C. section  360bbb-3(b)(1), unless the authorization is terminated or revoked.  Performed at [facility], [place] [address], [place], [place] [address]          Radiology Studies: DG CHEST PORT 1 VIEW  Result Date: 03/27/2020 CLINICAL DATA:  Preop cardiovascular exam.  Known left hip fracture. EXAM: PORTABLE CHEST 1 VIEW COMPARISON:  Chest radiograph 07/30/2016 FINDINGS: Upper normal heart size.The cardiomediastinal contours are normal. The lungs are clear. Pulmonary vasculature is normal. No consolidation, pleural effusion, or pneumothorax. No acute osseous abnormalities are seen. IMPRESSION: No acute chest findings. Electronically Signed   By: Keith Rake M.D.   On: 03/27/2020 21:17   DG Hip Unilat With Pelvis 2-3 Views Right  Result Date: 03/27/2020 CLINICAL DATA:  RIGHT hip pain following fall several days ago. Initial encounter. EXAM: DG HIP (WITH OR WITHOUT PELVIS) 2-3V RIGHT COMPARISON:  None. FINDINGS: A RIGHT femoral neck fracture is identified with varus angulation. No dislocation. No other  significant bony abnormalities identified. IMPRESSION: RIGHT femoral neck fracture with varus angulation. Electronically Signed   By: Margarette Canada M.D.   On: 03/27/2020 17:12        Scheduled Meds: . atorvastatin  10 mg Oral QHS  . insulin aspart  0-9 Units Subcutaneous Q4H  . [START ON 03/29/2020] levothyroxine  150 mcg Oral Q0600  . lisinopril  2.5 mg Oral Daily  . pantoprazole  40 mg Oral Daily   Continuous Infusions: . methocarbamol (ROBAXIN) IV       LOS: 1 day    Time spent: 35 minutes.    Dana Allan, MD  [facility] Pager #: [phone] 7PM-7AM contact night coverage as above

## 2020-03-29 ENCOUNTER — Encounter (HOSPITAL_COMMUNITY)
Admission: EM | Disposition: A | Discharge status: Skilled Nursing Facility | Payer: Self-pay | Source: Home / Self Care | Attending: Internal Medicine

## 2020-03-29 ENCOUNTER — Encounter (HOSPITAL_COMMUNITY): Payer: Self-pay | Admitting: Internal Medicine

## 2020-03-29 ENCOUNTER — Telehealth: Payer: Self-pay | Admitting: Orthopaedic Surgery

## 2020-03-29 ENCOUNTER — Inpatient Hospital Stay (HOSPITAL_COMMUNITY): Payer: Medicare Other | Admitting: Anesthesiology

## 2020-03-29 ENCOUNTER — Inpatient Hospital Stay (HOSPITAL_COMMUNITY): Payer: Medicare Other

## 2020-03-29 DIAGNOSIS — E039 Hypothyroidism, unspecified: Secondary | ICD-10-CM | POA: Diagnosis not present

## 2020-03-29 DIAGNOSIS — S72001A Fracture of unspecified part of neck of right femur, initial encounter for closed fracture: Secondary | ICD-10-CM | POA: Diagnosis not present

## 2020-03-29 DIAGNOSIS — I2510 Atherosclerotic heart disease of native coronary artery without angina pectoris: Secondary | ICD-10-CM | POA: Diagnosis not present

## 2020-03-29 HISTORY — PX: TOTAL HIP ARTHROPLASTY: SHX124

## 2020-03-29 LAB — CBC
HCT: 43.1 % (ref 39.0–52.0)
Hemoglobin: 15 g/dL (ref 13.0–17.0)
MCH: 31.9 pg (ref 26.0–34.0)
MCHC: 34.8 g/dL (ref 30.0–36.0)
MCV: 91.7 fL (ref 80.0–100.0)
Platelets: 237 10*3/uL (ref 150–400)
RBC: 4.7 MIL/uL (ref 4.22–5.81)
RDW: 13.9 % (ref 11.5–15.5)
WBC: 13.2 10*3/uL — ABNORMAL HIGH (ref 4.0–10.5)
nRBC: 0 % (ref 0.0–0.2)

## 2020-03-29 LAB — TYPE AND SCREEN
ABO/RH(D): O POS
Antibody Screen: NEGATIVE

## 2020-03-29 LAB — GLUCOSE, CAPILLARY
Glucose-Capillary: 125 mg/dL — ABNORMAL HIGH (ref 70–99)
Glucose-Capillary: 145 mg/dL — ABNORMAL HIGH (ref 70–99)
Glucose-Capillary: 147 mg/dL — ABNORMAL HIGH (ref 70–99)
Glucose-Capillary: 156 mg/dL — ABNORMAL HIGH (ref 70–99)
Glucose-Capillary: 181 mg/dL — ABNORMAL HIGH (ref 70–99)
Glucose-Capillary: 183 mg/dL — ABNORMAL HIGH (ref 70–99)
Glucose-Capillary: 368 mg/dL — ABNORMAL HIGH (ref 70–99)

## 2020-03-29 LAB — CREATININE, SERUM
Creatinine, Ser: 0.99 mg/dL (ref 0.61–1.24)
GFR, Estimated: >60 mL/min (ref >60)

## 2020-03-29 SURGERY — ARTHROPLASTY, HIP, TOTAL, ANTERIOR APPROACH
Anesthesia: General | Site: Hip | Laterality: Right

## 2020-03-29 MED ORDER — MENTHOL 3 MG MT LOZG: 1.0000 | LOZENGE | OROMUCOSAL | Status: DC | PRN

## 2020-03-29 MED ORDER — ROCURONIUM BROMIDE 10 MG/ML (PF) SYRINGE
PREFILLED_SYRINGE | INTRAVENOUS | Status: AC
  Filled 2020-03-29: qty 10

## 2020-03-29 MED ORDER — HYDROMORPHONE HCL 1 MG/ML IJ SOLN
INTRAMUSCULAR | Status: AC
  Filled 2020-03-29: qty 0.5

## 2020-03-29 MED ORDER — ROCURONIUM BROMIDE 10 MG/ML (PF) SYRINGE
PREFILLED_SYRINGE | INTRAVENOUS | Status: DC | PRN
  Administered 2020-03-29: 40 mg via INTRAVENOUS
  Administered 2020-03-29: 60 mg via INTRAVENOUS
  Administered 2020-03-29: 30 mg via INTRAVENOUS

## 2020-03-29 MED ORDER — SODIUM CHLORIDE 0.9 % IV SOLN: INTRAVENOUS | Status: DC

## 2020-03-29 MED ORDER — METHOCARBAMOL 500 MG PO TABS
500.0000 mg | ORAL_TABLET | Freq: Four times a day (QID) | ORAL | Status: DC | PRN
  Administered 2020-03-30 – 2020-03-31 (×3): 500 mg via ORAL
  Filled 2020-03-29 (×3): qty 1

## 2020-03-29 MED ORDER — CEFAZOLIN SODIUM-DEXTROSE 2-4 GM/100ML-% IV SOLN
2.0000 g | Freq: Four times a day (QID) | INTRAVENOUS | Status: AC
Start: 2020-03-29 — End: 2020-03-30
  Administered 2020-03-29 – 2020-03-30 (×3): 2 g via INTRAVENOUS
  Filled 2020-03-29 (×3): qty 100

## 2020-03-29 MED ORDER — VANCOMYCIN HCL 1000 MG IV SOLR
INTRAVENOUS | Status: AC
  Filled 2020-03-29: qty 1000

## 2020-03-29 MED ORDER — EPHEDRINE SULFATE-NACL 50-0.9 MG/10ML-% IV SOSY
PREFILLED_SYRINGE | INTRAVENOUS | Status: DC | PRN
  Administered 2020-03-29: 5 mg via INTRAVENOUS

## 2020-03-29 MED ORDER — SUGAMMADEX SODIUM 200 MG/2ML IV SOLN
INTRAVENOUS | Status: DC | PRN
  Administered 2020-03-29: 200 mg via INTRAVENOUS

## 2020-03-29 MED ORDER — PHENYLEPHRINE 40 MCG/ML (10ML) SYRINGE FOR IV PUSH (FOR BLOOD PRESSURE SUPPORT)
PREFILLED_SYRINGE | INTRAVENOUS | Status: DC | PRN
  Administered 2020-03-29: 120 ug via INTRAVENOUS

## 2020-03-29 MED ORDER — ACETAMINOPHEN 10 MG/ML IV SOLN
INTRAVENOUS | Status: AC
  Filled 2020-03-29: qty 100

## 2020-03-29 MED ORDER — LACTATED RINGERS IV SOLN: INTRAVENOUS | Status: DC

## 2020-03-29 MED ORDER — CELECOXIB 200 MG PO CAPS
ORAL_CAPSULE | ORAL | Status: AC
  Filled 2020-03-29: qty 1

## 2020-03-29 MED ORDER — DEXAMETHASONE SODIUM PHOSPHATE 10 MG/ML IJ SOLN
INTRAMUSCULAR | Status: DC | PRN
  Administered 2020-03-29: 5 mg via INTRAVENOUS

## 2020-03-29 MED ORDER — MIDAZOLAM HCL 2 MG/2ML IJ SOLN
INTRAMUSCULAR | Status: DC | PRN
  Administered 2020-03-29: 2 mg via INTRAVENOUS

## 2020-03-29 MED ORDER — DEXMEDETOMIDINE (PRECEDEX) IN NS 20 MCG/5ML (4 MCG/ML) IV SYRINGE
PREFILLED_SYRINGE | INTRAVENOUS | Status: DC | PRN
  Administered 2020-03-29: 8 ug via INTRAVENOUS
  Administered 2020-03-29: 4 ug via INTRAVENOUS

## 2020-03-29 MED ORDER — LIDOCAINE 2% (20 MG/ML) 5 ML SYRINGE
INTRAMUSCULAR | Status: DC | PRN
  Administered 2020-03-29: 100 mg via INTRAVENOUS

## 2020-03-29 MED ORDER — POLYETHYLENE GLYCOL 3350 17 G PO PACK: 17.0000 g | PACK | Freq: Every day | ORAL | Status: DC | PRN

## 2020-03-29 MED ORDER — LIDOCAINE 2% (20 MG/ML) 5 ML SYRINGE
INTRAMUSCULAR | Status: AC
  Filled 2020-03-29: qty 5

## 2020-03-29 MED ORDER — METHOCARBAMOL 1000 MG/10ML IJ SOLN
500.0000 mg | Freq: Four times a day (QID) | INTRAVENOUS | Status: DC | PRN
  Filled 2020-03-29: qty 5

## 2020-03-29 MED ORDER — ACETAMINOPHEN 10 MG/ML IV SOLN: INTRAVENOUS | Status: DC | PRN

## 2020-03-29 MED ORDER — TRANEXAMIC ACID 1000 MG/10ML IV SOLN
2000.0000 mg | INTRAVENOUS | Status: AC
  Administered 2020-03-29: 2000 mg via TOPICAL
  Filled 2020-03-29: qty 20

## 2020-03-29 MED ORDER — IRRISEPT - 450ML BOTTLE WITH 0.05% CHG IN STERILE WATER, USP 99.95% OPTIME
TOPICAL | Status: DC | PRN
  Administered 2020-03-29: 450 mL via TOPICAL

## 2020-03-29 MED ORDER — DOCUSATE SODIUM 100 MG PO CAPS: 100.0000 mg | ORAL_CAPSULE | Freq: Two times a day (BID) | ORAL | Status: DC

## 2020-03-29 MED ORDER — OXYCODONE HCL 5 MG PO TABS
5.0000 mg | ORAL_TABLET | ORAL | Status: DC | PRN
  Administered 2020-03-29: 10 mg via ORAL
  Administered 2020-03-30 (×3): 5 mg via ORAL
  Administered 2020-03-31: 10 mg via ORAL
  Administered 2020-03-31: 5 mg via ORAL
  Filled 2020-03-29 (×2): qty 1
  Filled 2020-03-29 (×6): qty 2
  Filled 2020-03-29: qty 1
  Filled 2020-03-29: qty 2

## 2020-03-29 MED ORDER — PHENOL 1.4 % MT LIQD: 1.0000 | OROMUCOSAL | Status: DC | PRN

## 2020-03-29 MED ORDER — ONDANSETRON HCL 4 MG/2ML IJ SOLN: 4.0000 mg | Freq: Four times a day (QID) | INTRAMUSCULAR | Status: DC | PRN

## 2020-03-29 MED ORDER — HYDROMORPHONE HCL 1 MG/ML IJ SOLN
INTRAMUSCULAR | Status: DC | PRN
  Administered 2020-03-29: .25 mg via INTRAVENOUS
  Administered 2020-03-29: .1 mg via INTRAVENOUS
  Administered 2020-03-29: .25 mg via INTRAVENOUS

## 2020-03-29 MED ORDER — ONDANSETRON HCL 4 MG/2ML IJ SOLN
INTRAMUSCULAR | Status: DC | PRN
  Administered 2020-03-29: 4 mg via INTRAVENOUS

## 2020-03-29 MED ORDER — PROPOFOL 10 MG/ML IV BOLUS
INTRAVENOUS | Status: DC | PRN
  Administered 2020-03-29: 20 mg via INTRAVENOUS
  Administered 2020-03-29: 170 mg via INTRAVENOUS

## 2020-03-29 MED ORDER — FENTANYL CITRATE (PF) 250 MCG/5ML IJ SOLN
INTRAMUSCULAR | Status: DC | PRN
  Administered 2020-03-29: 50 ug via INTRAVENOUS
  Administered 2020-03-29: 100 ug via INTRAVENOUS
  Administered 2020-03-29 (×2): 50 ug via INTRAVENOUS

## 2020-03-29 MED ORDER — PHENYLEPHRINE 40 MCG/ML (10ML) SYRINGE FOR IV PUSH (FOR BLOOD PRESSURE SUPPORT)
PREFILLED_SYRINGE | INTRAVENOUS | Status: AC
  Filled 2020-03-29: qty 10

## 2020-03-29 MED ORDER — FENTANYL CITRATE (PF) 250 MCG/5ML IJ SOLN
INTRAMUSCULAR | Status: AC
  Filled 2020-03-29: qty 5

## 2020-03-29 MED ORDER — ALUM & MAG HYDROXIDE-SIMETH 200-200-20 MG/5ML PO SUSP: 30.0000 mL | ORAL | Status: DC | PRN

## 2020-03-29 MED ORDER — POLYETHYLENE GLYCOL 3350 17 G PO PACK
17.0000 g | PACK | Freq: Every day | ORAL | Status: DC
  Administered 2020-03-31 – 2020-04-02 (×3): 17 g via ORAL
  Filled 2020-03-29 (×4): qty 1

## 2020-03-29 MED ORDER — ONDANSETRON HCL 4 MG/2ML IJ SOLN
INTRAMUSCULAR | Status: AC
  Filled 2020-03-29: qty 2

## 2020-03-29 MED ORDER — SORBITOL 70 % SOLN
30.0000 mL | Freq: Every day | Status: DC | PRN
  Administered 2020-04-02: 30 mL via ORAL
  Filled 2020-03-29 (×2): qty 30

## 2020-03-29 MED ORDER — CELECOXIB 200 MG PO CAPS
200.0000 mg | ORAL_CAPSULE | Freq: Once | ORAL | Status: AC
  Administered 2020-03-29: 200 mg via ORAL

## 2020-03-29 MED ORDER — ACETAMINOPHEN 500 MG PO TABS
1000.0000 mg | ORAL_TABLET | Freq: Four times a day (QID) | ORAL | Status: AC
  Administered 2020-03-29 – 2020-03-30 (×4): 1000 mg via ORAL
  Filled 2020-03-29 (×4): qty 2

## 2020-03-29 MED ORDER — CHLORHEXIDINE GLUCONATE 0.12 % MT SOLN
15.0000 mL | Freq: Once | OROMUCOSAL | Status: AC
  Administered 2020-03-29: 15 mL via OROMUCOSAL
  Filled 2020-03-29: qty 15

## 2020-03-29 MED ORDER — HYDROMORPHONE HCL 1 MG/ML IJ SOLN: 0.5000 mg | INTRAMUSCULAR | Status: DC | PRN

## 2020-03-29 MED ORDER — SODIUM CHLORIDE 0.9 % IR SOLN
Status: DC | PRN
  Administered 2020-03-29: 1000 mL

## 2020-03-29 MED ORDER — PROPOFOL 10 MG/ML IV BOLUS
INTRAVENOUS | Status: AC
  Filled 2020-03-29: qty 20

## 2020-03-29 MED ORDER — ENOXAPARIN SODIUM 40 MG/0.4ML ~~LOC~~ SOLN
40.0000 mg | SUBCUTANEOUS | Status: DC
  Administered 2020-03-30 – 2020-04-02 (×4): 40 mg via SUBCUTANEOUS
  Filled 2020-03-29 (×4): qty 0.4

## 2020-03-29 MED ORDER — BUPIVACAINE-MELOXICAM ER 400-12 MG/14ML IJ SOLN
INTRAMUSCULAR | Status: AC
  Filled 2020-03-29: qty 1

## 2020-03-29 MED ORDER — ADULT MULTIVITAMIN W/MINERALS CH
1.0000 | ORAL_TABLET | Freq: Every day | ORAL | Status: DC
  Administered 2020-03-30 – 2020-04-02 (×4): 1 via ORAL
  Filled 2020-03-29 (×4): qty 1

## 2020-03-29 MED ORDER — VANCOMYCIN HCL 1 G IV SOLR
INTRAVENOUS | Status: DC | PRN
  Administered 2020-03-29: 1000 mg via TOPICAL

## 2020-03-29 MED ORDER — DEXMEDETOMIDINE (PRECEDEX) IN NS 20 MCG/5ML (4 MCG/ML) IV SYRINGE
PREFILLED_SYRINGE | INTRAVENOUS | Status: AC
  Filled 2020-03-29: qty 5

## 2020-03-29 MED ORDER — ACETAMINOPHEN 325 MG PO TABS: 325.0000 mg | ORAL_TABLET | Freq: Four times a day (QID) | ORAL | Status: DC | PRN

## 2020-03-29 MED ORDER — 0.9 % SODIUM CHLORIDE (POUR BTL) OPTIME
TOPICAL | Status: DC | PRN
  Administered 2020-03-29: 1000 mL

## 2020-03-29 MED ORDER — DOCUSATE SODIUM 100 MG PO CAPS
100.0000 mg | ORAL_CAPSULE | Freq: Two times a day (BID) | ORAL | Status: DC
  Administered 2020-03-29 – 2020-04-02 (×4): 100 mg via ORAL
  Filled 2020-03-29 (×8): qty 1

## 2020-03-29 MED ORDER — DEXAMETHASONE SODIUM PHOSPHATE 10 MG/ML IJ SOLN
INTRAMUSCULAR | Status: AC
  Filled 2020-03-29: qty 1

## 2020-03-29 MED ORDER — EPHEDRINE 5 MG/ML INJ
INTRAVENOUS | Status: AC
  Filled 2020-03-29: qty 10

## 2020-03-29 MED ORDER — ACETAMINOPHEN 10 MG/ML IV SOLN
INTRAVENOUS | Status: DC | PRN
  Administered 2020-03-29: 1000 mg via INTRAVENOUS

## 2020-03-29 MED ORDER — MIDAZOLAM HCL 2 MG/2ML IJ SOLN
INTRAMUSCULAR | Status: AC
  Filled 2020-03-29: qty 2

## 2020-03-29 MED ORDER — BUPIVACAINE-MELOXICAM ER 400-12 MG/14ML IJ SOLN
INTRAMUSCULAR | Status: DC | PRN
  Administered 2020-03-29: 400 mg

## 2020-03-29 MED ORDER — ONDANSETRON HCL 4 MG PO TABS: 4.0000 mg | ORAL_TABLET | Freq: Four times a day (QID) | ORAL | Status: DC | PRN

## 2020-03-29 MED ORDER — OXYCODONE HCL 5 MG PO TABS
10.0000 mg | ORAL_TABLET | ORAL | Status: DC | PRN
  Administered 2020-03-30 – 2020-04-03 (×4): 10 mg via ORAL

## 2020-03-29 MED ORDER — MAGNESIUM CITRATE PO SOLN: 1.0000 | Freq: Once | ORAL | Status: DC | PRN

## 2020-03-29 SURGICAL SUPPLY — 69 items
BAG DECANTER FOR FLEXI CONT (MISCELLANEOUS) ×2 IMPLANT
CELLS DAT CNTRL 66122 CELL SVR (MISCELLANEOUS) IMPLANT
COOLER ICEMAN CLASSIC (MISCELLANEOUS) ×1 IMPLANT
COVER PERINEAL POST (MISCELLANEOUS) ×2 IMPLANT
COVER SURGICAL LIGHT HANDLE (MISCELLANEOUS) ×2 IMPLANT
COVER WAND RF STERILE (DRAPES) ×2 IMPLANT
CUP ACET PINNACLE SECTR 60MM (Hips) IMPLANT
DERMABOND ADHESIVE PROPEN (GAUZE/BANDAGES/DRESSINGS) ×1
DERMABOND ADVANCED .7 DNX6 (GAUZE/BANDAGES/DRESSINGS) IMPLANT
DRAPE C-ARM 42X72 X-RAY (DRAPES) ×2 IMPLANT
DRAPE HIP W/POCKET STRL (MISCELLANEOUS) ×1 IMPLANT
DRAPE POUCH INSTRU U-SHP 10X18 (DRAPES) ×2 IMPLANT
DRAPE STERI IOBAN 125X83 (DRAPES) ×2 IMPLANT
DRAPE U-SHAPE 47X51 STRL (DRAPES) ×4 IMPLANT
DRESSING AQUACEL AG SP 3.5X10 (GAUZE/BANDAGES/DRESSINGS) IMPLANT
DRSG AQUACEL AG ADV 3.5X10 (GAUZE/BANDAGES/DRESSINGS) ×2 IMPLANT
DRSG AQUACEL AG SP 3.5X10 (GAUZE/BANDAGES/DRESSINGS) ×2
DURAPREP 26ML APPLICATOR (WOUND CARE) ×5 IMPLANT
ELECT BLADE 4.0 EZ CLEAN MEGAD (MISCELLANEOUS) ×2
ELECT REM PT RETURN 9FT ADLT (ELECTROSURGICAL) ×2
ELECTRODE BLDE 4.0 EZ CLN MEGD (MISCELLANEOUS) ×1 IMPLANT
ELECTRODE REM PT RTRN 9FT ADLT (ELECTROSURGICAL) ×1 IMPLANT
GLOVE ECLIPSE 7.0 STRL STRAW (GLOVE) ×4 IMPLANT
GLOVE SKINSENSE NS SZ7.5 (GLOVE) ×1
GLOVE SKINSENSE STRL SZ7.5 (GLOVE) ×1 IMPLANT
GLOVE SURG SYN 7.5  E (GLOVE) ×8
GLOVE SURG SYN 7.5 E (GLOVE) ×4 IMPLANT
GLOVE SURG SYN 7.5 PF PI (GLOVE) ×4 IMPLANT
GLOVE SURG UNDER POLY LF SZ7 (GLOVE) ×2 IMPLANT
GOWN STRL REIN XL XLG (GOWN DISPOSABLE) ×2 IMPLANT
GOWN STRL REUS W/ TWL LRG LVL3 (GOWN DISPOSABLE) IMPLANT
GOWN STRL REUS W/ TWL XL LVL3 (GOWN DISPOSABLE) ×1 IMPLANT
GOWN STRL REUS W/TWL LRG LVL3 (GOWN DISPOSABLE)
GOWN STRL REUS W/TWL XL LVL3 (GOWN DISPOSABLE) ×2
HANDPIECE INTERPULSE COAX TIP (DISPOSABLE) ×2
HEAD CERAMIC DELTA 28 P1.5 HIP (Head) ×1 IMPLANT
HOOD PEEL AWAY FLYTE STAYCOOL (MISCELLANEOUS) ×4 IMPLANT
IV NS IRRIG 3000ML ARTHROMATIC (IV SOLUTION) ×2 IMPLANT
JET LAVAGE IRRISEPT WOUND (IRRIGATION / IRRIGATOR) ×2
KIT BASIN OR (CUSTOM PROCEDURE TRAY) ×2 IMPLANT
LAVAGE JET IRRISEPT WOUND (IRRIGATION / IRRIGATOR) ×1 IMPLANT
LINER ACETAB BM 51X28 (Liner) ×1 IMPLANT
LINER DM PINNACLE 60-51 (Liner) ×1 IMPLANT
MARKER SKIN DUAL TIP RULER LAB (MISCELLANEOUS) ×2 IMPLANT
NDL SPNL 18GX3.5 QUINCKE PK (NEEDLE) ×1 IMPLANT
NEEDLE SPNL 18GX3.5 QUINCKE PK (NEEDLE) ×2 IMPLANT
PACK TOTAL JOINT (CUSTOM PROCEDURE TRAY) ×2 IMPLANT
PACK UNIVERSAL I (CUSTOM PROCEDURE TRAY) ×2 IMPLANT
PINNSECTOR W/GRIP ACE CUP 60MM (Hips) ×2 IMPLANT
RETRACTOR WND ALEXIS 18 MED (MISCELLANEOUS) IMPLANT
RTRCTR WOUND ALEXIS 18CM MED (MISCELLANEOUS)
SAW OSC TIP CART 19.5X105X1.3 (SAW) ×2 IMPLANT
SCREW 6.5MMX25MM (Screw) ×1 IMPLANT
SET HNDPC FAN SPRY TIP SCT (DISPOSABLE) ×1 IMPLANT
STAPLER VISISTAT 35W (STAPLE) IMPLANT
STEM FEM ACTIS HIGH SZ8 (Stem) ×1 IMPLANT
SUT ETHIBOND 2 V 37 (SUTURE) ×2 IMPLANT
SUT VIC AB 0 CT1 27 (SUTURE) ×2
SUT VIC AB 0 CT1 27XBRD ANBCTR (SUTURE) ×1 IMPLANT
SUT VIC AB 1 CTX 36 (SUTURE) ×2
SUT VIC AB 1 CTX36XBRD ANBCTR (SUTURE) ×1 IMPLANT
SUT VIC AB 2-0 CT1 27 (SUTURE) ×8
SUT VIC AB 2-0 CT1 TAPERPNT 27 (SUTURE) ×2 IMPLANT
SYR 50ML LL SCALE MARK (SYRINGE) ×2 IMPLANT
TOWEL GREEN STERILE (TOWEL DISPOSABLE) ×2 IMPLANT
TRAY CATH 16FR W/PLASTIC CATH (SET/KITS/TRAYS/PACK) IMPLANT
TRAY FOLEY W/BAG SLVR 16FR (SET/KITS/TRAYS/PACK) ×2
TRAY FOLEY W/BAG SLVR 16FR ST (SET/KITS/TRAYS/PACK) ×1 IMPLANT
YANKAUER SUCT BULB TIP NO VENT (SUCTIONS) ×2 IMPLANT

## 2020-03-29 NOTE — Discharge Instructions (Signed)

## 2020-03-29 NOTE — Op Note (Signed)
TOTAL HIP ARTHROPLASTY POSTERIOR APPROACH  Procedure Note [name]   [id number]  Pre-op Diagnosis: RIGHT FEMORAL NECK FRACTURE     Post-op Diagnosis: same   Operative Procedures  1. Total hip replacement; Right hip; uncemented [id number]   Personnel  Surgeon(s): [name], MD   Anesthesia: GENERAL  Prosthesis: Depuy Acetabulum: Pinnacle 60 mm Femur: Actis 8 HO Head: 51/28 dual mobility size: +1.5 Bearing Type: ceramic/poly  Date of Service: 03/29/2020   Indication: 70 y.o. year old male who suffered a right femoral neck fracture the night of his recent right shoulder surgery when he fell at home.  The pain never improved and presented to the ED this weekend and was found to have a right femoral neck fracture.  After risk and benefits of a total hip arthroplasty were explained, the patient elected to proceed with a total hip arthroplasty after voicing understanding.  Procedure:  After informed consent was obtained and understanding of the risk were voiced including but not limited to bleeding, infection, damage to surrounding structures including nerves and vessels, blood clots, leg length inequality, dislocation and the failure to achieve desired results, the operative extremity was marked with verbal confirmation of the patient in the holding area.  We first attempted to use his prosthesis in order to do an anterior approach but the leg was too swollen to fit in the prosthetic socket.  Therefore, we decided to use a posterior approach.  He was placed in the lateral decubitus position. The operative limb was then prepped and draped in the usual sterile fashion and preoperative antibiotics were administered.  A time out was performed prior to the start of surgery confirming the correct extremity, preoperative antibiotic administration, as well as team members, implants and instruments available for the case. Correct surgical site was also confirmed with preoperative radiographs. A  standard posterior approach to the hip was performed. A capsulotomy was performed and the capsule tagged for later repair.  Fracture hematoma was encountered, the femoral neck cut was made below the fracture about 1 fingerbreadth above the lesser trochanter. The acetabulum was exposed and labrum resected. We did sequential reaming up to a size 59 mm reamer. The acetabular component, size 60 mm was then impacted into place and position evaluated with guides. A 25 mm cancellous screw was placed for added fixation.  The dual mobility metallic liner was placed.  We then turned our attention to femoral preparation. We began sequential broaching to a size 8 stem. This size produced good fit and rotational stability. We placed the trial neck and a 51/28 mm dual mobility +1.5 head.  Leg lengths were evaluated on the table. The hip was stable in extension and external rotation without impingement. The hip was stable in deep flexion. In 90 degrees of abduction and neutral abduction the hip was stable to 90 degrees of internal rotation. In a position of sleep with hip adducted across the body the hip was stable to 90 degrees of rotation. Combined anteversion was found to be 35 degrees. At this point we removed the trial head and neck and turned out attention to the acetabulum. Bone screws were placed for rotational stability, and the final polyethylene liner was placed. We then turned back to the femur and removed the trial broach. The final femoral implant was placed.  We then irrigated and dried the trunion, and the head was placed. We placed an 51/28 mm dual mobility +1.5 head. The hip was again reduced, and taken through a  range of motion and found to be stable as above. The hip was thoroughly irrigated, and a posterior capsular repair was performed with #2 Ethibond. The wound was irrigated with dilute betadine in normal saline, and then again with normal saline. The deep fascia was closed with #1 vicryl, #0 Vicryl for  the deep fat layer, and 2.0 Vicryl Plus for the subcutaneous tissue. The skin was closed with 2.0 nylon and dermabond.  A sterile dressing was applied. The patient was awakened and transported to the recovery room in stable condition. All sponge, needle, and instrument counts were correct at the end of the case.  Position: lateral decubitus   Complications: none.  Time Out: performed   Drains/Packing: none  Estimated blood loss: 150 cc  Returned to Recovery Room: in good condition.   Antibiotics: yes   Mechanical VTE (DVT) Prophylaxis: sequential compression devices, TED thigh-high  Chemical VTE (DVT) Prophylaxis: lovenox  Fluid Replacement  Blood: none  FFP: none   Specimens Removed: 1 to pathology   Sponge and Instrument Count Correct? yes   PACU: portable radiograph - low AP pelvis  Admission: inpatient status, start PT & OT POD#1  Plan/RTC: Return in 2 weeks for wound check.  Weight Bearing/Load Lower Extremity: full  Posterior hip precautions  N. Eduard Roux, MD [facility] 3:11 PM

## 2020-03-29 NOTE — Telephone Encounter (Signed)
Patient's wife Opal Sidles called to inform that patient was hospitalized. Patient fall and broke his hip and Dr. [name] will be doing surgery on his hip today. Patient cancelled his 1 wk post op appt cancelled. Patient's wife phone number is [phone].

## 2020-03-29 NOTE — Progress Notes (Addendum)
PROGRESS NOTE    [name]  YIF:[id number] DOB: [birth year] DOA: 03/27/2020 PCP: [name], MD   Brief Narrative:  Patient is a 70 year old Caucasian male with past medical history significant for hypertension, hyperlipidemia, diabetes mellitus type 2, OSA off of CPAP, status post gastric bypass in 2012, GERD, status post right BKA secondary to complications of diabetes mellitus, recent right shoulder surgery for full-thickness rotator cuff tear (within the last week) and iron deficiency anemia.  Patient was discharged from the hospital on Tuesday after rotator cuff surgery.  Patient fell at home on the same day of discharge.  Right hip pain gradually got worse.  Patient eventually presented to the hospital and was found to have right hip fracture.  He transferred to [facility] for scheduled hip surgery with orthopedic surgery on 3/7.  Assessment & Plan:  Right hip fracture: -Status post fall. -Orthopedic surgery is on board-appreciate help -He is n.p.o. today for scheduled procedure this afternoon -Continue as needed pain medications  Hypertension: Stable -Continue lisinopril  Hyperlipidemia: Continue statin  Hypothyroidism: Continue Synthyroid  GERD: Continue Protonix  Type 2 diabetes mellitus: Well controlled.  A1c 6.8%  -continue sliding scale insulin  Coronary artery disease: Stable -Patient denies any ACS symptoms.  Continue statin and ACE inhibitor  Chronic diastolic CHF: Patient appears euvolemic on exam. -Not on diuretics at home.  Continue statin and ACE inhibitor's  Status post right BKA: Stable  Recent right rotator cuff repair: -In sling.  Continue as needed pain medications.  Constipation: -Had not had bowel movement since 1 week however has been passing gas.  Denies nausea, vomiting or abdominal pain. -Ordered MiraLAX and Colace.  DVT prophylaxis: SCD Code Status: Full code Family Communication:  None present at bedside.  Plan of care discussed with  patient in length and he verbalized understanding and agreed with it. Disposition Plan: To be determined  Consultants:   Orthopedic surgery  Procedures:   None  Antimicrobials:   Cefazolin  Status is: Inpatient   Dispo: The patient is from: Home              Anticipated d/c is to: SNF              Patient currently is not medically stable to d/c.   Difficult to place patient No         Subjective: Patient seen and examined.  Sitting comfortably on the bed.  No new complaints.  Waiting for his surgery which is scheduled this afternoon.  Remained afebrile.  Denies chest pain, shortness of breath, nausea, vomiting, headache or blurry vision.  Objective: Vitals:   03/28/20 1410 03/28/20 1940 03/29/20 0558 03/29/20 0735  BP: (!) 144/81 108/78 137/80 (!) 143/75  Pulse: 63 68 (!) 53 (!) 47  Resp: 17 18  17   Temp: 98.3 F (36.8 C) 97.9 F (36.6 C) (!) 97.4 F (36.3 C) 98.4 F (36.9 C)  TempSrc: Oral Oral Oral Oral  SpO2: 97% 96% 96% 97%  Weight:      Height:        Intake/Output Summary (Last 24 hours) at 03/29/2020 1121 Last data filed at 03/29/2020 0640 Gross per 24 hour  Intake 240 ml  Output 850 ml  Net -610 ml   Filed Weights   03/27/20 2100  Weight: 111.1 kg    Examination:  General exam: Appears calm and comfortable, on room air, communicating well Respiratory system: Clear to auscultation. Respiratory effort normal. Cardiovascular system: S1 & S2 heard, RRR. No  JVD, murmurs, rubs, gallops or clicks. No pedal edema. Gastrointestinal system: Abdomen is nondistended, soft and nontender. No organomegaly or masses felt. Normal bowel sounds heard. Central nervous system: Alert and oriented. No focal neurological deficits. Extremities: Right BKA, right shoulder in sling, restricted range of motion in right extremity due to pain.  No swelling or redness noted. Skin: No rashes, lesions or ulcers Psychiatry: Judgement and insight appear normal. Mood & affect  appropriate.    Data Reviewed: I have personally reviewed following labs and imaging studies  CBC: Recent Labs  Lab 03/23/20 1228 03/27/20 1716 03/28/20 0256  WBC 6.9 9.1 7.7  NEUTROABS  --  7.3  --   HGB 15.3 14.2 13.9  HCT 46.0 41.8 41.2  MCV 91.8 92.3 92.6  PLT 188 186 431   Basic Metabolic Panel: Recent Labs  Lab 03/23/20 1228 03/27/20 1716 03/28/20 0256  NA 142 137 139  K 3.9 3.7 3.5  CL 107 103 105  CO2 19* 24 24  GLUCOSE 180* 145* 107*  BUN 11 17 17   CREATININE 0.96 1.05 0.79  CALCIUM 9.1 9.1 8.8*   GFR: Estimated Creatinine Clearance: 115 mL/min (by C-G formula based on SCr of 0.79 mg/dL). Liver Function Tests: Recent Labs  Lab 03/27/20 1716 03/28/20 0256  AST 21 18  ALT 18 17  ALKPHOS 49 46  BILITOT 1.4* 1.2  PROT 6.7 6.5  ALBUMIN 3.7 3.5  3.5   No results for input(s): LIPASE, AMYLASE in the last 168 hours. No results for input(s): AMMONIA in the last 168 hours. Coagulation Profile: Recent Labs  Lab 03/27/20 1716  INR 1.1   Cardiac Enzymes: No results for input(s): CKTOTAL, CKMB, CKMBINDEX, TROPONINI in the last 168 hours. BNP (last 3 results) No results for input(s): PROBNP in the last 8760 hours. HbA1C: Recent Labs    03/28/20 0256  HGBA1C 6.8*   CBG: Recent Labs  Lab 03/28/20 1941 03/29/20 0039 03/29/20 0312 03/29/20 0823 03/29/20 1109  GLUCAP 196* 156* 125* 147* 145*   Lipid Profile: No results for input(s): CHOL, HDL, LDLCALC, TRIG, CHOLHDL, LDLDIRECT in the last 72 hours. Thyroid Function Tests: No results for input(s): TSH, T4TOTAL, FREET4, T3FREE, THYROIDAB in the last 72 hours. Anemia Panel: No results for input(s): VITAMINB12, FOLATE, FERRITIN, TIBC, IRON, RETICCTPCT in the last 72 hours. Sepsis Labs: No results for input(s): PROCALCITON, LATICACIDVEN in the last 168 hours.  Recent Results (from the past 240 hour(s))  SARS CORONAVIRUS 2 (TAT 6-24 HRS) Nasopharyngeal Nasopharyngeal Swab     Status: None    Collection Time: 03/20/20  9:59 AM   Specimen: Nasopharyngeal Swab  Result Value Ref Range Status   SARS Coronavirus 2 NEGATIVE NEGATIVE Final    Comment: (NOTE) SARS-CoV-2 target nucleic acids are NOT DETECTED.  The SARS-CoV-2 RNA is generally detectable in upper and lower respiratory specimens during the acute phase of infection. Negative results do not preclude SARS-CoV-2 infection, do not rule out co-infections with other pathogens, and should not be used as the sole basis for treatment or other patient management decisions. Negative results must be combined with clinical observations, patient history, and epidemiological information. The expected result is Negative.  Fact Sheet for Patients: SugarRoll.be  Fact Sheet for Healthcare Providers: https://www.woods-mathews.com/  This test is not yet approved or cleared by the [place] FDA and  has been authorized for detection and/or diagnosis of SARS-CoV-2 by FDA under an Emergency Use Authorization (EUA). This EUA will remain  in effect (meaning this test can  be used) for the duration of the COVID-19 declaration under Se ction 564(b)(1) of the Act, 21 U.S.C. section 360bbb-3(b)(1), unless the authorization is terminated or revoked sooner.  Performed at [facility], [place] [address], [place], [place] [address]   Resp Panel by RT-PCR (Flu A&B, Covid) Nasopharyngeal Swab     Status: None   Collection Time: 03/27/20  5:16 PM   Specimen: Nasopharyngeal Swab; Nasopharyngeal(NP) swabs in vial transport medium  Result Value Ref Range Status   SARS Coronavirus 2 by RT PCR NEGATIVE NEGATIVE Final    Comment: (NOTE) SARS-CoV-2 target nucleic acids are NOT DETECTED.  The SARS-CoV-2 RNA is generally detectable in upper respiratory specimens during the acute phase of infection. The lowest concentration of SARS-CoV-2 viral copies this assay can detect is 138 copies/mL. A negative result  does not preclude SARS-Cov-2 infection and should not be used as the sole basis for treatment or other patient management decisions. A negative result may occur with  improper specimen collection/handling, submission of specimen other than nasopharyngeal swab, presence of viral mutation(s) within the areas targeted by this assay, and inadequate number of viral copies(<138 copies/mL). A negative result must be combined with clinical observations, patient history, and epidemiological information. The expected result is Negative.  Fact Sheet for Patients:  EntrepreneurPulse.com.au  Fact Sheet for Healthcare Providers:  IncredibleEmployment.be  This test is no t yet approved or cleared by the [place] FDA and  has been authorized for detection and/or diagnosis of SARS-CoV-2 by FDA under an Emergency Use Authorization (EUA). This EUA will remain  in effect (meaning this test can be used) for the duration of the COVID-19 declaration under Section 564(b)(1) of the Act, 21 U.S.C.section 360bbb-3(b)(1), unless the authorization is terminated  or revoked sooner.       Influenza A by PCR NEGATIVE NEGATIVE Final   Influenza B by PCR NEGATIVE NEGATIVE Final    Comment: (NOTE) The Xpert Xpress SARS-CoV-2/FLU/RSV plus assay is intended as an aid in the diagnosis of influenza from Nasopharyngeal swab specimens and should not be used as a sole basis for treatment. Nasal washings and aspirates are unacceptable for Xpert Xpress SARS-CoV-2/FLU/RSV testing.  Fact Sheet for Patients: EntrepreneurPulse.com.au  Fact Sheet for Healthcare Providers: IncredibleEmployment.be  This test is not yet approved or cleared by the [place] FDA and has been authorized for detection and/or diagnosis of SARS-CoV-2 by FDA under an Emergency Use Authorization (EUA). This EUA will remain in effect (meaning this test can be used) for  the duration of the COVID-19 declaration under Section 564(b)(1) of the Act, 21 U.S.C. section 360bbb-3(b)(1), unless the authorization is terminated or revoked.  Performed at [facility], [place] [address], [place], [place] [address]   Surgical pcr screen     Status: None   Collection Time: 03/28/20  2:41 PM   Specimen: Nasal Mucosa; Nasal Swab  Result Value Ref Range Status   MRSA, PCR NEGATIVE NEGATIVE Final   Staphylococcus aureus NEGATIVE NEGATIVE Final    Comment: (NOTE) The Xpert SA Assay (FDA approved for NASAL specimens in patients 66 years of age and older), is one component of a comprehensive surveillance program. It is not intended to diagnose infection nor to guide or monitor treatment. Performed at [facility], [place] [address], [place], [place] [address]       Radiology Studies: DG CHEST PORT 1 VIEW  Result Date: 03/27/2020 CLINICAL DATA:  Preop cardiovascular exam.  Known left hip fracture. EXAM:  PORTABLE CHEST 1 VIEW COMPARISON:  Chest radiograph 07/30/2016 FINDINGS: Upper normal heart size.The cardiomediastinal contours are normal. The lungs are clear. Pulmonary vasculature is normal. No consolidation, pleural effusion, or pneumothorax. No acute osseous abnormalities are seen. IMPRESSION: No acute chest findings. Electronically Signed   By: Keith Rake M.D.   On: 03/27/2020 21:17   DG Hip Unilat With Pelvis 2-3 Views Right  Result Date: 03/27/2020 CLINICAL DATA:  RIGHT hip pain following fall several days ago. Initial encounter. EXAM: DG HIP (WITH OR WITHOUT PELVIS) 2-3V RIGHT COMPARISON:  None. FINDINGS: A RIGHT femoral neck fracture is identified with varus angulation. No dislocation. No other significant bony abnormalities identified. IMPRESSION: RIGHT femoral neck fracture with varus angulation. Electronically Signed   By: Margarette Canada M.D.   On: 03/27/2020 17:12    Scheduled Meds: . atorvastatin  10 mg Oral QHS  . insulin aspart  0-9  Units Subcutaneous Q4H  . levothyroxine  150 mcg Oral Q0600  . lisinopril  2.5 mg Oral Daily  . pantoprazole  40 mg Oral Daily  . povidone-iodine  2 application Topical Once  . tranexamic acid (CYKLOKAPRON) topical - INTRAOP  2,000 mg Topical To OR   Continuous Infusions: .  ceFAZolin (ANCEF) IV    . methocarbamol (ROBAXIN) IV    . tranexamic acid       LOS: 2 days   Time spent: 35 minutes   [place] Downs Loann Quill, MD [facility]  If 7PM-7AM, please contact night-coverage [email or web address] 03/29/2020, 11:21 AM

## 2020-03-29 NOTE — Anesthesia Preprocedure Evaluation (Addendum)
Anesthesia Evaluation  Patient identified by MRN, date of birth, ID band Patient awake    Reviewed: Allergy & Precautions, NPO status , Patient's Chart, lab work & pertinent test results  History of Anesthesia Complications Negative for: history of anesthetic complications  Airway Mallampati: II  TM Distance: >3 FB Neck ROM: Full    Dental no notable dental hx. (+) Dental Advisory Given   Pulmonary neg pulmonary ROS,    Pulmonary exam normal        Cardiovascular hypertension, Pt. on medications + CAD, + Past MI, + Cardiac Stents (2006), + Peripheral Vascular Disease and +CHF  Normal cardiovascular exam+ Valvular Problems/Murmurs   Echo 04/19/16: moderate LVH, EF 45-50%, inferior hypokinesis, grade 1 DD, mild AS/AR, mild MR, RV systolic function is mildly reduced   Last evaluation by cardiology was 12/22/19 by Dr. Marlou Porch.  Patient was doing well from a CAD standpoint.  Lisinopril was decreased due to some occasional positional lightheadedness. No high risk symptoms related to bifascicular block--continue to monitor although may need PPM in the future. (He had been seen on 09/12/19 by [name], PA for preoperative clearance for hand surgery which was done on 09/16/19. He notes patient with history of intra-operative bradycardia, one that occurred during 10/10/10 Roux-en-Y where it looks like HR briefly dipped into the 30's. He has tolerated several procedure since including some under regional anesthesia and others under GETA.) 12 months follow-up planned.   Neuro/Psych negative neurological ROS  negative psych ROS   GI/Hepatic Neg liver ROS, GERD  Medicated and Controlled,  Endo/Other  diabetes, Type 2, Oral Hypoglycemic AgentsHypothyroidism   Renal/GU negative Renal ROS  negative genitourinary   Musculoskeletal  (+) Arthritis ,   Abdominal   Peds  Hematology negative hematology ROS (+)   Anesthesia Other Findings Day of  surgery medications reviewed with patient.  Reproductive/Obstetrics negative OB ROS                           Anesthesia Physical  Anesthesia Plan  ASA: III  Anesthesia Plan: General   Post-op Pain Management:    Induction: Intravenous  PONV Risk Score and Plan: 2 and Ondansetron and Propofol infusion  Airway Management Planned: Natural Airway  Additional Equipment: None  Intra-op Plan:   Post-operative Plan: Extubation in OR  Informed Consent: I have reviewed the patients History and Physical, chart, labs and discussed the procedure including the risks, benefits and alternatives for the proposed anesthesia with the patient or authorized representative who has indicated his/her understanding and acceptance.     Dental advisory given  Plan Discussed with: CRNA and Anesthesiologist  Anesthesia Plan Comments: (PAT note written 03/22/2020 by Myra Gianotti, PA-C. SAME DAY WORK-UP  Known first degree AV block, LAFB, RBBB. 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 Cardiologist is Dr. Marlou Porch.  )       Anesthesia Quick Evaluation

## 2020-03-29 NOTE — H&P (Signed)

## 2020-03-29 NOTE — Anesthesia Procedure Notes (Addendum)
Procedure Name: Intubation Date/Time: 03/29/2020 1:08 PM Performed by: Rande Brunt, CRNA Pre-anesthesia Checklist: Patient identified, Emergency Drugs available, Suction available and Patient being monitored Patient Re-evaluated:Patient Re-evaluated prior to induction Oxygen Delivery Method: Circle System Utilized Preoxygenation: Pre-oxygenation with 100% oxygen Induction Type: IV induction Ventilation: Mask ventilation without difficulty Laryngoscope Size: Mac and 4 Grade View: Grade I Tube type: Oral Number of attempts: 1 Airway Equipment and Method: Stylet Placement Confirmation: ETT inserted through vocal cords under direct vision,  positive ETCO2 and breath sounds checked- equal and bilateral Secured at: 23 cm Tube secured with: Tape Dental Injury: Teeth and Oropharynx as per pre-operative assessment

## 2020-03-29 NOTE — Progress Notes (Signed)
Pt left unit to surgery.

## 2020-03-29 NOTE — Progress Notes (Signed)
Initial Nutrition Assessment  DOCUMENTATION CODES:  Not applicable  INTERVENTION:  Advance diet once medically able.  Consider starting bowel regimen - spoke with MD.  Add MVI with minerals daily.  Once medically able, add Ensure Enlive po TID, each supplement provides 350 kcal and 20 grams of protein (NOT vanilla).  Once medically able, add Magic cup TID with meals, each supplement provides 290 kcal and 9 grams of protein (NOT vanilla).  NUTRITION DIAGNOSIS:  Inadequate oral intake related to inability to eat as evidenced by NPO status.  GOAL:  Patient will meet greater than or equal to 90% of their needs  MONITOR:  Diet advancement  REASON FOR ASSESSMENT:  Consult Hip fracture protocol  ASSESSMENT:  70 yo male with a PMH of HLP, HTN, T2DM, s/p gastric bypass 2012, GERD, s/p R BKA, iron deficiency anemia who presents with R hip fx after a fall.  Spoke with pt at bedside this morning. Pt in good spirits. Pt reports not having much of an appetite this last week d/t R shoulder surgery last Tuesday. Prior to that, he reports eating all right. His appetite, he reports, was not "great," but he ate normally. He also reports having gastric bypass years ago, so is agreeable after surgery to any supplements, as long as they are not vanilla flavored. Per Epic documentation, ate 50% of breakfast and 100% of lunch yesterday.   He also reports no drastic weight changes recently. Epic documents stable weight over the past two years.  Last BM documented 6 days ago. May want to consider OBR. Secure chatted MD and will be ordered.  Pt aware of protein and caloric increase after surgery.  Relevant Medications: SSI, protonix, hydrocodone Labs: reviewed; Glucose 107  NUTRITION - FOCUSED PHYSICAL EXAM: Flowsheet Row Most Recent Value  Orbital Region Mild depletion  Upper Arm Region No depletion  Thoracic and Lumbar Region No depletion  Buccal Region Mild depletion  Temple Region No  depletion  Clavicle Bone Region Moderate depletion  Clavicle and Acromion Bone Region Mild depletion  Scapular Bone Region Unable to assess  [s/p shoulder surgery 1 week ago]  Dorsal Hand Mild depletion  Patellar Region Mild depletion  Anterior Thigh Region No depletion  Posterior Calf Region Mild depletion  Edema (RD Assessment) Mild  Hair Reviewed  Eyes Reviewed  Mouth Reviewed  Skin Reviewed  Nails Reviewed  [pale nail beds]     Diet Order:   Diet Order            Diet NPO time specified Except for: Sips with Meds  Diet effective midnight                EDUCATION NEEDS:  Education needs have been addressed  Skin:  Skin Assessment: Reviewed RN Assessment  Last BM:  03/23/20  Height:  Ht Readings from Last 1 Encounters:  03/27/20 6\' 8"  (2.032 m)   Weight:  Wt Readings from Last 1 Encounters:  03/27/20 111.1 kg   Ideal Body Weight:  94.5 kg (Adjusted for R BKA)  BMI:  Body mass index is 26.91 kg/m.  Estimated Nutritional Needs:  Kcal:  2500-2700 Protein:  135-150 grams Fluid:  >2 L  Derrel Nip, RD, LDN Registered Dietitian After Hours/Weekend Pager # in [place]

## 2020-03-29 NOTE — Transfer of Care (Signed)
Immediate Anesthesia Transfer of Care Note  Patient: [name]  Procedure(s) Performed: TOTAL HIP ARTHROPLASTY POSTERIOR APPROACH (Right Hip)  Patient Location: PACU  Anesthesia Type:General  Level of Consciousness: awake, alert  and oriented  Airway & Oxygen Therapy: Patient Spontanous Breathing  Post-op Assessment: Report given to RN, Post -op Vital signs reviewed and stable and Patient moving all extremities  Post vital signs: Reviewed and stable  Last Vitals:  Vitals Value Taken Time  BP 127/76 03/29/20 1547  Temp    Pulse 76 03/29/20 1548  Resp 11 03/29/20 1548  SpO2 95 % 03/29/20 1548  Vitals shown include unvalidated device data.  Last Pain:  Vitals:   03/29/20 0810  TempSrc:   PainSc: 0-No pain      Patients Stated Pain Goal: 3 (42/76/70 1100)  Complications: No complications documented.

## 2020-03-29 NOTE — Telephone Encounter (Signed)
FYI

## 2020-03-30 ENCOUNTER — Encounter: Payer: Self-pay | Admitting: Orthopaedic Surgery

## 2020-03-30 ENCOUNTER — Inpatient Hospital Stay: Payer: Medicare Other | Admitting: Orthopaedic Surgery

## 2020-03-30 ENCOUNTER — Encounter (HOSPITAL_COMMUNITY): Payer: Self-pay | Admitting: Orthopaedic Surgery

## 2020-03-30 DIAGNOSIS — I2510 Atherosclerotic heart disease of native coronary artery without angina pectoris: Secondary | ICD-10-CM | POA: Diagnosis not present

## 2020-03-30 DIAGNOSIS — E1159 Type 2 diabetes mellitus with other circulatory complications: Secondary | ICD-10-CM | POA: Diagnosis not present

## 2020-03-30 DIAGNOSIS — S72001A Fracture of unspecified part of neck of right femur, initial encounter for closed fracture: Secondary | ICD-10-CM | POA: Diagnosis not present

## 2020-03-30 LAB — BASIC METABOLIC PANEL
Anion gap: 10 (ref 5–15)
BUN: 16 mg/dL (ref 8–23)
CO2: 23 mmol/L (ref 22–32)
Calcium: 8.4 mg/dL — ABNORMAL LOW (ref 8.9–10.3)
Chloride: 101 mmol/L (ref 98–111)
Creatinine, Ser: 0.94 mg/dL (ref 0.61–1.24)
GFR, Estimated: >60 mL/min (ref >60)
Glucose, Bld: 186 mg/dL — ABNORMAL HIGH (ref 70–99)
Potassium: 3.8 mmol/L (ref 3.5–5.1)
Sodium: 134 mmol/L — ABNORMAL LOW (ref 135–145)

## 2020-03-30 LAB — GLUCOSE, CAPILLARY
Glucose-Capillary: 154 mg/dL — ABNORMAL HIGH (ref 70–99)
Glucose-Capillary: 158 mg/dL — ABNORMAL HIGH (ref 70–99)
Glucose-Capillary: 192 mg/dL — ABNORMAL HIGH (ref 70–99)
Glucose-Capillary: 206 mg/dL — ABNORMAL HIGH (ref 70–99)
Glucose-Capillary: 224 mg/dL — ABNORMAL HIGH (ref 70–99)
Glucose-Capillary: 245 mg/dL — ABNORMAL HIGH (ref 70–99)

## 2020-03-30 LAB — CBC
HCT: 38.6 % — ABNORMAL LOW (ref 39.0–52.0)
Hemoglobin: 13 g/dL (ref 13.0–17.0)
MCH: 31.2 pg (ref 26.0–34.0)
MCHC: 33.7 g/dL (ref 30.0–36.0)
MCV: 92.6 fL (ref 80.0–100.0)
Platelets: 202 10*3/uL (ref 150–400)
RBC: 4.17 MIL/uL — ABNORMAL LOW (ref 4.22–5.81)
RDW: 14.2 % (ref 11.5–15.5)
WBC: 10.3 10*3/uL (ref 4.0–10.5)
nRBC: 0 % (ref 0.0–0.2)

## 2020-03-30 MED ORDER — FERROUS SULFATE 325 (65 FE) MG PO TABS
325.0000 mg | ORAL_TABLET | Freq: Every day | ORAL | Status: DC
  Administered 2020-03-30 – 2020-04-02 (×4): 325 mg via ORAL
  Filled 2020-03-30 (×4): qty 1

## 2020-03-30 MED ORDER — CALCIUM CITRATE 950 (200 CA) MG PO TABS: 500.0000 mg | ORAL_TABLET | Freq: Three times a day (TID) | ORAL | Status: DC

## 2020-03-30 MED ORDER — INSULIN ASPART 100 UNIT/ML ~~LOC~~ SOLN
0.0000 [IU] | Freq: Three times a day (TID) | SUBCUTANEOUS | Status: DC
  Administered 2020-03-30: 3 [IU] via SUBCUTANEOUS
  Administered 2020-03-31 – 2020-04-01 (×7): 2 [IU] via SUBCUTANEOUS
  Administered 2020-04-01: 3 [IU] via SUBCUTANEOUS
  Administered 2020-04-02 (×4): 2 [IU] via SUBCUTANEOUS

## 2020-03-30 MED ORDER — CALCIUM CARBONATE 1250 (500 CA) MG PO TABS
500.0000 mg | ORAL_TABLET | Freq: Three times a day (TID) | ORAL | Status: DC
  Administered 2020-03-30 – 2020-04-02 (×11): 500 mg via ORAL
  Filled 2020-03-30 (×11): qty 1

## 2020-03-30 NOTE — Progress Notes (Signed)
Rehab Admissions Coordinator Note:  Patient was screened by Cleatrice Burke for appropriateness for an Inpatient Acute Rehab Consult per therapy recommendation.   At this time, we are recommending Inpatient Rehab consult. I will place order per protocol.  Cleatrice Burke RN MSN 03/30/2020, 4:02 PM  I can be reached at [phone].

## 2020-03-30 NOTE — Evaluation (Signed)
Occupational Therapy Evaluation Patient Details Name: [name] MRN: [id number] DOB: November 24, 1950 Today's Date: 03/30/2020    History of Present Illness 70 yo male presents to ED on 3/5 with R femoral neck fracture after fall on 3/1 s/p R RTC repair when transferring to electric scooter. s/p R THA posterior approach on 3/7. PMH includes R shoulder arthroscopy with debridement and arthroscopically-assisted rotator cuff repair on 03/23/20, gastric bypass 2012, OA, CAD with MI and previous stenting, GERD, first degree AV block, DM, HLD, HTN, L 2nd and 4th toe progressing to transmet amputation, R BKA 2018.   Clinical Impression   Pt presents with decline in function and safety with ADLs and ADL mobility with impaired strength, balance, endurance and R UE ROM/function (immobilized in DonJoy sling). Prior to RTC scope, pt was Ind with ADLs/selfcare; pt fell after he got home resulting in R femoral neck fx. Pt currently requires mod A with UB ADLs, mas - total A with LB ADLs, total A with toileting and  mod-max +2 assist for moving to and from EOB today, tolerating EOB sitting x15 minutes. Unable to don prosthesis (not fitting secondary to swelling, wife to take back to prosthetist to adjust) to attempt OOB mobility. Pt would benefit from acute OT services to address impairments to maximize level of function and safety    Follow Up Recommendations  CIR    Equipment Recommendations  Other (comment) (TBD at next venue of care)    Recommendations for Other Services Rehab consult     Precautions / Restrictions Precautions Precautions: Fall;Posterior Hip Precaution Booklet Issued: Yes (comment) Precaution Comments: handout administered, reviewed precautions throughout mobility Restrictions Weight Bearing Restrictions: Yes RUE Weight Bearing: Non weight bearing RLE Weight Bearing: Weight bearing as tolerated Other Position/Activity Restrictions: NWB R UE, WBAT R LE      Mobility Bed Mobility Overal  bed mobility: Needs Assistance Bed Mobility: Supine to Sit;Sit to Supine     Supine to sit: Mod assist;+2 for physical assistance;HOB elevated Sit to supine: +2 for physical assistance;HOB elevated;Max assist   General bed mobility comments: mod-max +2 for trunk elevation/lowering, translating RLE to/from EOB with cues for maintaining posterior hip precautions, scooting to and from EOB, and boost up in bed upon return to supine.    Transfers                 General transfer comment: unable - RLE too swollen to don prosthesis, pt's wife to take prosthesis to prosthetist for adjustment    Balance Overall balance assessment: Needs assistance;History of Falls Sitting-balance support: Single extremity supported;Feet unsupported Sitting balance-Leahy Scale: Fair Sitting balance - Comments: able to sit EOB without PT or OT intervention, heavy sacral sitting and posterior leaning with fatigue. EOB sitting x15 minutes Postural control: Posterior lean     Standing balance comment: unable to assess today                           ADL either performed or assessed with clinical judgement   ADL Overall ADL's : Needs assistance/impaired Eating/Feeding: Set up;Sitting   Grooming: Wash/dry hands;Wash/dry face;Min guard;Sitting   Upper Body Bathing: Moderate assistance   Lower Body Bathing: Maximal assistance   Upper Body Dressing : Moderate assistance   Lower Body Dressing: Total assistance     Toilet Transfer Details (indicate cue type and reason): unable to don R LE prosthesis Toileting- Clothing Manipulation and Hygiene: Total assistance;Bed level  General ADL Comments: Pt and wife educated on LB compensatory ADL techniques, reviewed sling wear and UB ADL selfcare     Vision Baseline Vision/History: Wears glasses Patient Visual Report: No change from baseline       Perception     Praxis      Pertinent Vitals/Pain Pain Assessment: Faces Faces  Pain Scale: Hurts even more Pain Location: R hip Pain Descriptors / Indicators: Discomfort;Grimacing;Sore Pain Intervention(s): Limited activity within patient's tolerance;Monitored during session;Repositioned     Hand Dominance Right   Extremity/Trunk Assessment Upper Extremity Assessment Upper Extremity Assessment: RUE deficits/detail RUE Deficits / Details: R UE DonJoy sling RUE: Unable to fully assess due to immobilization   Lower Extremity Assessment Lower Extremity Assessment: Defer to PT evaluation RLE Deficits / Details: anticipated post-operative hip weakness; able to perform quad set, hip abduction, SLR. Pt holding LE in knee extension until cued to relax into flexion sitting EOB   Cervical / Trunk Assessment Cervical / Trunk Assessment: Other exceptions;Kyphotic Cervical / Trunk Exceptions: sacral sitting EOB   Communication Communication Communication: No difficulties   Cognition Arousal/Alertness: Awake/alert Behavior During Therapy: WFL for tasks assessed/performed Overall Cognitive Status: Within Functional Limits for tasks assessed                                 General Comments: motivated, follows safely cues well   General Comments       Exercises     Shoulder Instructions      Home Living Family/patient expects to be discharged to:: Private residence Living Arrangements: Spouse/significant other Available Help at Discharge: Family;Available PRN/intermittently Type of Home: House Home Access: Stairs to enter [employer] of Steps: 2 Entrance Stairs-Rails: None Home Layout: Two level;Able to live on main level with bedroom/bathroom     Bathroom Shower/Tub: Occupational psychologist: Handicapped height     Home Equipment: Transport planner;Other (comment);Walker - 2 wheels;Cane - single point (knee walker)          Prior Functioning/Environment Level of Independence: Independent with assistive device(s)         Comments: Prior to RTC scope, pt Ind with ADLs ambulatory with prosthesis donned without AD. Pt uses RW for short distances when prosthesis is doffed. Since RTC repair, pt has required assist with ADLs/selfcare and has been using prosthesis and no AD, transfer level to/from electric scooter with prosthesis doffed        OT Problem List: Decreased strength;Impaired balance (sitting and/or standing);Pain;Decreased coordination;Decreased activity tolerance      OT Treatment/Interventions: Self-care/ADL training;Therapeutic exercise;Patient/family education;Therapeutic activities;DME and/or AE instruction    OT Goals(Current goals can be found in the care plan section) Acute Rehab OT Goals Patient Stated Goal: return to my independence OT Goal Formulation: With patient/family Time For Goal Achievement: 04/13/20 Potential to Achieve Goals: Good ADL Goals Pt Will Perform Grooming: with supervision;with set-up;sitting Pt Will Perform Upper Body Bathing: with min assist;sitting Pt Will Perform Lower Body Bathing: with mod assist;sitting/lateral leans Pt Will Perform Upper Body Dressing: with min assist;sitting Pt Will Transfer to Toilet: with max assist;with mod assist;with transfer board (lateral scoot to drop arm recliner)  OT Frequency: Min 2X/week   Barriers to D/C:            Co-evaluation PT/OT/SLP Co-Evaluation/Treatment: Yes Reason for Co-Treatment: For patient/therapist safety;To address functional/ADL transfers PT goals addressed during session: Mobility/safety with mobility;Balance;Strengthening/ROM OT goals addressed during session: ADL's and  self-care;Proper use of Adaptive equipment and DME      AM-PAC OT "6 Clicks" Daily Activity     Outcome Measure Help from another person eating meals?: None Help from another person taking care of personal grooming?: A Little Help from another person toileting, which includes using toliet, bedpan, or urinal?: Total Help from  another person bathing (including washing, rinsing, drying)?: A Lot Help from another person to put on and taking off regular upper body clothing?: A Lot Help from another person to put on and taking off regular lower body clothing?: Total 6 Click Score: 13   End of Session Equipment Utilized During Treatment: Other (comment) (pt has R DonJoy sling)  Activity Tolerance: Patient limited by fatigue Patient left: in bed;with call bell/phone within reach;with family/visitor present  OT Visit Diagnosis: Other abnormalities of gait and mobility (R26.89);Muscle weakness (generalized) (M62.81);History of falling (Z91.81);Pain Pain - Right/Left: Right Pain - part of body: Shoulder;Hip                Time: 7939-6886 OT Time Calculation (min): 35 min Charges:  OT General Charges $OT Visit: 1 Visit OT Evaluation $OT Eval Moderate Complexity: 1 Mod    Britt Bottom 03/30/2020, 4:03 PM

## 2020-03-30 NOTE — Plan of Care (Signed)

## 2020-03-30 NOTE — Evaluation (Signed)
Physical Therapy Evaluation Patient Details Name: [name] MRN: [id number] DOB: [birth year] Today's Date: 03/30/2020   History of Present Illness  70 yo male presents to ED on 3/5 with R femoral neck fracture after fall on 3/1 s/p R RTC repair when transferring to electric scooter. s/p R THA posterior approach on 3/7. PMH includes R shoulder arthroscopy with debridement and arthroscopically-assisted rotator cuff repair on 03/23/20, gastric bypass 2012, OA, CAD with MI and previous stenting, GERD, first degree AV block, DM, HLD, HTN, L 2nd and 4th toe progressing to transmet amputation, R BKA 2018.  Clinical Impression   Pt presents with R hip post-operative pain, impaired mobility, decreased knowledge and application of posterior hip precautions, impaired balance with history of falls, and decreased activity tolerance. Pt to benefit from acute PT to address deficits. Pt required mod-max +2 assist for moving to and from EOB today, tolerating EOB sitting x15 minutes while PT attempted to don prosthesis (not fitting secondary to swelling, wife to take back to prosthetist to adjust). Pt eager to return to independence, with 24/7 support available from wife at d/c. PT recommending CIR to address mobility deficits post-acutely. PT to progress mobility as tolerated, and will continue to follow acutely.      Follow Up Recommendations CIR    Equipment Recommendations  None recommended by PT    Recommendations for Other Services       Precautions / Restrictions Precautions Precautions: Fall;Posterior Hip Precaution Booklet Issued: Yes (comment) Precaution Comments: handout administered, reviewed precautions throughout mobility Restrictions Weight Bearing Restrictions: No RLE Weight Bearing: Weight bearing as tolerated      Mobility  Bed Mobility Overal bed mobility: Needs Assistance Bed Mobility: Supine to Sit;Sit to Supine     Supine to sit: Mod assist;+2 for physical assistance;HOB  elevated Sit to supine: +2 for physical assistance;HOB elevated;Max assist   General bed mobility comments: mod-max +2 for trunk elevation/lowering, translating RLE to/from EOB with cues for maintaining posterior hip precautions, scooting to and from EOB, and boost up in bed upon return to supine.    Transfers                 General transfer comment: unable - RLE too swollen to don prosthesis, pt's wife to take prosthesis to prosthetist for adjustment  Ambulation/Gait                Stairs            Wheelchair Mobility    Modified Rankin (Stroke Patients Only)       Balance Overall balance assessment: Needs assistance;History of Falls Sitting-balance support: Single extremity supported;Feet unsupported Sitting balance-Leahy Scale: Fair Sitting balance - Comments: able to sit EOB without PT or OT intervention, heavy sacral sitting and posterior leaning with fatigue. EOB sitting x15 minutes Postural control: Posterior lean     Standing balance comment: unable to assess today                             Pertinent Vitals/Pain Pain Assessment: Faces Faces Pain Scale: Hurts even more Pain Location: R hip Pain Descriptors / Indicators: Discomfort;Grimacing;Sore Pain Intervention(s): Limited activity within patient's tolerance;Monitored during session;Repositioned    Home Living Family/patient expects to be discharged to:: Private residence Living Arrangements: Spouse/significant other Available Help at Discharge: Family;Available PRN/intermittently Type of Home: House Home Access: Stairs to enter Entrance Stairs-Rails: None Entrance Stairs-Number of Steps: 2 Home Layout: Two level;Able to live  on main level with bedroom/bathroom Home Equipment: Transport planner;Other (comment);Walker - 2 wheels;Cane - single point (knee walker)      Prior Function Level of Independence: Independent with assistive device(s)         Comments: Prior to  RTC scope, pt Ind with ADLs ambulatory with prosthesis donned without AD. Pt uses RW for short distances when prosthesis is doffed. Since RTC repair, pt has required assist with ADLs/selfcare and has been using prosthesis and no AD, transfer level to/from electric scooter with prosthesis doffed     Hand Dominance   Dominant Hand: Right    Extremity/Trunk Assessment   Upper Extremity Assessment Upper Extremity Assessment: RUE deficits/detail RUE Deficits / Details: R UE DonJoy sling RUE: Unable to fully assess due to immobilization    Lower Extremity Assessment Lower Extremity Assessment: Defer to PT evaluation RLE Deficits / Details: anticipated post-operative hip weakness; able to perform quad set, hip abduction, SLR. Pt holding LE in knee extension until cued to relax into flexion sitting EOB    Cervical / Trunk Assessment Cervical / Trunk Assessment: Other exceptions;Kyphotic Cervical / Trunk Exceptions: sacral sitting EOB  Communication   Communication: No difficulties  Cognition Arousal/Alertness: Awake/alert Behavior During Therapy: WFL for tasks assessed/performed Overall Cognitive Status: Within Functional Limits for tasks assessed                                 General Comments: motivated, follows safely cues well      General Comments      Exercises     Assessment/Plan    PT Assessment Patient needs continued PT services  PT Problem List Decreased strength;Decreased mobility;Decreased safety awareness;Decreased activity tolerance;Decreased balance;Decreased range of motion;Decreased knowledge of use of DME;Pain;Decreased knowledge of precautions       PT Treatment Interventions DME instruction;Therapeutic exercise;Gait training;Balance training;Neuromuscular re-education;Functional mobility training;Therapeutic activities;Patient/family education    PT Goals (Current goals can be found in the Care Plan section)  Acute Rehab PT Goals Patient  Stated Goal: return to my independence PT Goal Formulation: With patient/family Time For Goal Achievement: 04/13/20 Potential to Achieve Goals: Good    Frequency Min 3X/week   Barriers to discharge        Co-evaluation PT/OT/SLP Co-Evaluation/Treatment: Yes Reason for Co-Treatment: For patient/therapist safety;To address functional/ADL transfers PT goals addressed during session: Mobility/safety with mobility;Balance;Strengthening/ROM OT goals addressed during session: ADL's and self-care;Proper use of Adaptive equipment and DME       AM-PAC PT "6 Clicks" Mobility  Outcome Measure Help needed turning from your back to your side while in a flat bed without using bedrails?: A Little Help needed moving from lying on your back to sitting on the side of a flat bed without using bedrails?: A Lot Help needed moving to and from a bed to a chair (including a wheelchair)?: A Lot Help needed standing up from a chair using your arms (e.g., wheelchair or bedside chair)?: A Lot Help needed to walk in hospital room?: Total Help needed climbing 3-5 steps with a railing? : Total 6 Click Score: 11    End of Session   Activity Tolerance: Patient tolerated treatment well;Patient limited by fatigue Patient left: in bed;with call bell/phone within reach;with bed alarm set;with family/visitor present;with SCD's reapplied Nurse Communication: Mobility status PT Visit Diagnosis: Difficulty in walking, not elsewhere classified (R26.2);History of falling (Z91.81)    Time: 3086-5784 PT Time Calculation (min) (ACUTE ONLY): 35 min  Charges:   PT Evaluation $PT Eval Low Complexity: 1 Low        Cozy Veale S, PT Acute Rehabilitation Services Pager [phone]  Office [phone]  Louis Matte 03/30/2020, 3:52 PM

## 2020-03-30 NOTE — Anesthesia Postprocedure Evaluation (Signed)
Anesthesia Post Note  Patient: Astin Sayre  Procedure(s) Performed: TOTAL HIP ARTHROPLASTY POSTERIOR APPROACH (Right Hip)     Patient location during evaluation: Other Anesthesia Type: General Level of consciousness: awake and alert Pain management: pain level controlled Vital Signs Assessment: post-procedure vital signs reviewed and stable Respiratory status: spontaneous breathing, nonlabored ventilation and respiratory function stable Cardiovascular status: blood pressure returned to baseline and stable Postop Assessment: no apparent nausea or vomiting Anesthetic complications: no   No complications documented.  Last Vitals:  Vitals:   03/30/20 1131 03/30/20 1241  BP: 109/70   Pulse: 69   Resp: 16   Temp: 36.5 C 36.8 C  SpO2: 98%     Last Pain:  Vitals:   03/30/20 1241  TempSrc: Axillary  PainSc: 4                  Amnah Breuer,W. EDMOND

## 2020-03-30 NOTE — Progress Notes (Signed)
Subjective: 1 Day Post-Op Procedure(s) (LRB): TOTAL HIP ARTHROPLASTY POSTERIOR APPROACH (Right) Patient reports pain as mild.    Objective: Vital signs in last 24 hours: Temp:  [97.4 F (36.3 C)-98.3 F (36.8 C)] 98 F (36.7 C) (03/08 0727) Pulse Rate:  [56-108] 56 (03/08 0727) Resp:  [11-18] 16 (03/08 0727) BP: (104-127)/(62-80) 116/70 (03/08 0727) SpO2:  [93 %-97 %] 96 % (03/08 0727) Weight:  [111.1 kg] 111.1 kg (03/07 1150)  Intake/Output from previous day: 03/07 0701 - 03/08 0700 In: 1300 [I.V.:1000; IV Piggyback:300] Out: 975 [Urine:900; Blood:75] Intake/Output this shift: No intake/output data recorded.  Recent Labs    03/27/20 1716 03/28/20 0256 03/29/20 1756  HGB 14.2 13.9 15.0   Recent Labs    03/28/20 0256 03/29/20 1756  WBC 7.7 13.2*  RBC 4.45 4.70  HCT 41.2 43.1  PLT 194 237   Recent Labs    03/27/20 1716 03/28/20 0256 03/29/20 1756  NA 137 139  --   K 3.7 3.5  --   CL 103 105  --   CO2 24 24  --   BUN 17 17  --   CREATININE 1.05 0.79 0.99  GLUCOSE 145* 107*  --   CALCIUM 9.1 8.8*  --    Recent Labs    03/27/20 1716  INR 1.1    Neurologically intact Incision: dressing C/D/I No cellulitis present   Assessment/Plan: 1 Day Post-Op Procedure(s) (LRB): TOTAL HIP ARTHROPLASTY POSTERIOR APPROACH (Right) Up with therapy  WBAT RLE with prosthesis Patient to wear stump shrinker lovenox for dvt ppx F/u with Dr. [name] 2 weeks post-op      Aundra Dubin 03/30/2020, 8:23 AM

## 2020-03-30 NOTE — Progress Notes (Signed)
PROGRESS NOTE    [name]  EUM:[id number] DOB: 11-12-1950 DOA: 03/27/2020 PCP: [name], MD   Brief Narrative:  Patient is a 70 year old Caucasian male with past medical history significant for hypertension, hyperlipidemia, diabetes mellitus type 2, OSA off of CPAP, status post gastric bypass in 2012, GERD, status post right BKA secondary to complications of diabetes mellitus, recent right shoulder surgery for full-thickness rotator cuff tear (within the last week) and iron deficiency anemia.  Patient was discharged from the hospital on Tuesday after rotator cuff surgery.  Patient fell at home on the same day of discharge.  Right hip pain gradually got worse.  Patient eventually presented to the hospital and was found to have right hip fracture.  He transferred to [facility] for scheduled hip surgery with orthopedic surgery on 3/7.  Assessment & Plan:  Right hip fracture: -Status post right hip posterior arthroplasty -POD # 1 -Continue as needed pain medications -Ortho recommended up with therapy, WBAT RLE with prosthesis.  Patient to wear stump shrinker. -Continue Lovenox for DVT prophylaxis and recommend to follow Dr. [name] in 2 weeks postop.  Hypertension: Stable -Continue lisinopril  Hyperlipidemia: Continue statin  Hypothyroidism: Continue Synthyroid  GERD: Continue Protonix  Type 2 diabetes mellitus: Well controlled.  A1c 6.8%  -continue sliding scale insulin  Coronary artery disease: Stable -Patient denies any ACS symptoms.  Continue statin and ACE inhibitor  Chronic diastolic CHF: Patient appears euvolemic on exam. -Not on diuretics at home.  Continue statin and ACE inhibitor's  Status post right BKA: Stable  Recent right rotator cuff repair: -In sling.  Continue as needed pain medications.  Constipation: -Had not had bowel movement since 1 week however has been passing gas.  Denies nausea, vomiting or abdominal pain. -Continue MiraLAX and Colace.  Review  suppository  DVT prophylaxis: SCD/Lovenox Code Status: Full code Family Communication:  None present at bedside.  Plan of care discussed with patient in length and he verbalized understanding and agreed with it. Disposition Plan: To be determined  Consultants:   Orthopedic surgery  Procedures:   None  Antimicrobials:   Cefazolin  Status is: Inpatient   Dispo: The patient is from: Home              Anticipated d/c is to: SNF              Patient currently is not medically stable to d/c.   Difficult to place patient No     Subjective: Patient seen and examined.  Sitting comfortably on the bed and eating breakfast.  Denies any new complaints.  Had not had bowel movement yet however passing gas.  Does not want suppository at this time.  Did sleep well last night.  Objective: Vitals:   03/29/20 1807 03/29/20 1938 03/30/20 0452 03/30/20 0727  BP: 104/80 110/69 109/67 116/70  Pulse: 90 (!) 108 (!) 58 (!) 56  Resp:  18 17 16   Temp: (!) 97.4 F (36.3 C) 98.3 F (36.8 C) 98 F (36.7 C) 98 F (36.7 C)  TempSrc: Oral  Oral Oral  SpO2: 93% 96% 94% 96%  Weight:      Height:        Intake/Output Summary (Last 24 hours) at 03/30/2020 1001 Last data filed at 03/30/2020 0500 Gross per 24 hour  Intake 1300 ml  Output 975 ml  Net 325 ml   Filed Weights   03/27/20 2100 03/29/20 1150  Weight: 111.1 kg 111.1 kg    Examination:  General exam:  Appears calm and comfortable, on room air, communicating well, eating breakfast Respiratory system: Clear to auscultation. Respiratory effort normal. Cardiovascular system: S1 & S2 heard, RRR. No JVD, murmurs, rubs, gallops or clicks. No pedal edema. Gastrointestinal system: Abdomen is nondistended, soft and nontender. No organomegaly or masses felt. Normal bowel sounds heard. Central nervous system: Alert and oriented. No focal neurological deficits. Extremities:  right shoulder in sling -Right BKA.  Dressing dry and intact on right  upper/lateral hip.  No signs of active bleeding or discharge seen.   Skin: No rashes, lesions or ulcers Psychiatry: Judgement and insight appear normal. Mood & affect appropriate.    Data Reviewed: I have personally reviewed following labs and imaging studies  CBC: Recent Labs  Lab 03/23/20 1228 03/27/20 1716 03/28/20 0256 03/29/20 1756  WBC 6.9 9.1 7.7 13.2*  NEUTROABS  --  7.3  --   --   HGB 15.3 14.2 13.9 15.0  HCT 46.0 41.8 41.2 43.1  MCV 91.8 92.3 92.6 91.7  PLT 188 186 194 505   Basic Metabolic Panel: Recent Labs  Lab 03/23/20 1228 03/27/20 1716 03/28/20 0256 03/29/20 1756  NA 142 137 139  --   K 3.9 3.7 3.5  --   CL 107 103 105  --   CO2 19* 24 24  --   GLUCOSE 180* 145* 107*  --   BUN 11 17 17   --   CREATININE 0.96 1.05 0.79 0.99  CALCIUM 9.1 9.1 8.8*  --    GFR: Estimated Creatinine Clearance: 92.9 mL/min (by C-G formula based on SCr of 0.99 mg/dL). Liver Function Tests: Recent Labs  Lab 03/27/20 1716 03/28/20 0256  AST 21 18  ALT 18 17  ALKPHOS 49 46  BILITOT 1.4* 1.2  PROT 6.7 6.5  ALBUMIN 3.7 3.5  3.5   No results for input(s): LIPASE, AMYLASE in the last 168 hours. No results for input(s): AMMONIA in the last 168 hours. Coagulation Profile: Recent Labs  Lab 03/27/20 1716  INR 1.1   Cardiac Enzymes: No results for input(s): CKTOTAL, CKMB, CKMBINDEX, TROPONINI in the last 168 hours. BNP (last 3 results) No results for input(s): PROBNP in the last 8760 hours. HbA1C: Recent Labs    03/28/20 0256  HGBA1C 6.8*   CBG: Recent Labs  Lab 03/29/20 1657 03/29/20 1937 03/30/20 0006 03/30/20 0520 03/30/20 0823  GLUCAP 181* 368* 224* 154* 206*   Lipid Profile: No results for input(s): CHOL, HDL, LDLCALC, TRIG, CHOLHDL, LDLDIRECT in the last 72 hours. Thyroid Function Tests: No results for input(s): TSH, T4TOTAL, FREET4, T3FREE, THYROIDAB in the last 72 hours. Anemia Panel: No results for input(s): VITAMINB12, FOLATE, FERRITIN, TIBC,  IRON, RETICCTPCT in the last 72 hours. Sepsis Labs: No results for input(s): PROCALCITON, LATICACIDVEN in the last 168 hours.  Recent Results (from the past 240 hour(s))  Resp Panel by RT-PCR (Flu A&B, Covid) Nasopharyngeal Swab     Status: None   Collection Time: 03/27/20  5:16 PM   Specimen: Nasopharyngeal Swab; Nasopharyngeal(NP) swabs in vial transport medium  Result Value Ref Range Status   SARS Coronavirus 2 by RT PCR NEGATIVE NEGATIVE Final    Comment: (NOTE) SARS-CoV-2 target nucleic acids are NOT DETECTED.  The SARS-CoV-2 RNA is generally detectable in upper respiratory specimens during the acute phase of infection. The lowest concentration of SARS-CoV-2 viral copies this assay can detect is 138 copies/mL. A negative result does not preclude SARS-Cov-2 infection and should not be used as the sole basis for treatment  or other patient management decisions. A negative result may occur with  improper specimen collection/handling, submission of specimen other than nasopharyngeal swab, presence of viral mutation(s) within the areas targeted by this assay, and inadequate number of viral copies(<138 copies/mL). A negative result must be combined with clinical observations, patient history, and epidemiological information. The expected result is Negative.  Fact Sheet for Patients:  EntrepreneurPulse.com.au  Fact Sheet for Healthcare Providers:  IncredibleEmployment.be  This test is no t yet approved or cleared by the [place] FDA and  has been authorized for detection and/or diagnosis of SARS-CoV-2 by FDA under an Emergency Use Authorization (EUA). This EUA will remain  in effect (meaning this test can be used) for the duration of the COVID-19 declaration under Section 564(b)(1) of the Act, 21 U.S.C.section 360bbb-3(b)(1), unless the authorization is terminated  or revoked sooner.       Influenza A by PCR NEGATIVE NEGATIVE Final    Influenza B by PCR NEGATIVE NEGATIVE Final    Comment: (NOTE) The Xpert Xpress SARS-CoV-2/FLU/RSV plus assay is intended as an aid in the diagnosis of influenza from Nasopharyngeal swab specimens and should not be used as a sole basis for treatment. Nasal washings and aspirates are unacceptable for Xpert Xpress SARS-CoV-2/FLU/RSV testing.  Fact Sheet for Patients: EntrepreneurPulse.com.au  Fact Sheet for Healthcare Providers: IncredibleEmployment.be  This test is not yet approved or cleared by the [place] FDA and has been authorized for detection and/or diagnosis of SARS-CoV-2 by FDA under an Emergency Use Authorization (EUA). This EUA will remain in effect (meaning this test can be used) for the duration of the COVID-19 declaration under Section 564(b)(1) of the Act, 21 U.S.C. section 360bbb-3(b)(1), unless the authorization is terminated or revoked.  Performed at [facility], [place] [address] [place], [place] [address]   Surgical pcr screen     Status: None   Collection Time: 03/28/20  2:41 PM   Specimen: Nasal Mucosa; Nasal Swab  Result Value Ref Range Status   MRSA, PCR NEGATIVE NEGATIVE Final   Staphylococcus aureus NEGATIVE NEGATIVE Final    Comment: (NOTE) The Xpert SA Assay (FDA approved for NASAL specimens in patients 4 years of age and older), is one component of a comprehensive surveillance program. It is not intended to diagnose infection nor to guide or monitor treatment. Performed at [facility], [place] [address] [place], [place] [address]       Radiology Studies: Pelvis Portable  Result Date: 03/29/2020 CLINICAL DATA:  Status post right hip replacement EXAM: PORTABLE PELVIS 1-2 VIEWS COMPARISON:  03/27/2020 FINDINGS: Right hip replacement is now seen in satisfactory position. No acute bony abnormality or soft tissue abnormality is noted. IMPRESSION: Right hip replacement in satisfactory  position. Electronically Signed   By: Inez Catalina M.D.   On: 03/29/2020 17:09    Scheduled Meds: . acetaminophen  1,000 mg Oral Q6H  . atorvastatin  10 mg Oral QHS  . docusate sodium  100 mg Oral BID  . enoxaparin (LOVENOX) injection  40 mg Subcutaneous Q24H  . insulin aspart  0-9 Units Subcutaneous Q4H  . levothyroxine  150 mcg Oral Q0600  . lisinopril  2.5 mg Oral Daily  . multivitamin with minerals  1 tablet Oral Daily  . pantoprazole  40 mg Oral Daily  . polyethylene glycol  17 g Oral Daily   Continuous Infusions: . sodium chloride 75 mL/hr at 03/29/20 1806  . methocarbamol (ROBAXIN) IV       LOS:  3 days   Time spent: 35 minutes   Rinka Loann Quill, MD [facility]  If 7PM-7AM, please contact night-coverage [email or web address] 03/30/2020, 10:01 AM

## 2020-03-31 ENCOUNTER — Telehealth: Payer: Self-pay

## 2020-03-31 DIAGNOSIS — S72001D Fracture of unspecified part of neck of right femur, subsequent encounter for closed fracture with routine healing: Secondary | ICD-10-CM | POA: Diagnosis not present

## 2020-03-31 DIAGNOSIS — I2510 Atherosclerotic heart disease of native coronary artery without angina pectoris: Secondary | ICD-10-CM | POA: Diagnosis not present

## 2020-03-31 DIAGNOSIS — I5032 Chronic diastolic (congestive) heart failure: Secondary | ICD-10-CM | POA: Diagnosis not present

## 2020-03-31 DIAGNOSIS — E1159 Type 2 diabetes mellitus with other circulatory complications: Secondary | ICD-10-CM | POA: Diagnosis not present

## 2020-03-31 LAB — CBC
HCT: 36 % — ABNORMAL LOW (ref 39.0–52.0)
Hemoglobin: 12.5 g/dL — ABNORMAL LOW (ref 13.0–17.0)
MCH: 31.4 pg (ref 26.0–34.0)
MCHC: 34.7 g/dL (ref 30.0–36.0)
MCV: 90.5 fL (ref 80.0–100.0)
Platelets: 181 10*3/uL (ref 150–400)
RBC: 3.98 MIL/uL — ABNORMAL LOW (ref 4.22–5.81)
RDW: 13.9 % (ref 11.5–15.5)
WBC: 9 10*3/uL (ref 4.0–10.5)
nRBC: 0 % (ref 0.0–0.2)

## 2020-03-31 LAB — BASIC METABOLIC PANEL
Anion gap: 10 (ref 5–15)
BUN: 14 mg/dL (ref 8–23)
CO2: 20 mmol/L — ABNORMAL LOW (ref 22–32)
Calcium: 8.4 mg/dL — ABNORMAL LOW (ref 8.9–10.3)
Chloride: 103 mmol/L (ref 98–111)
Creatinine, Ser: 0.82 mg/dL (ref 0.61–1.24)
GFR, Estimated: >60 mL/min (ref >60)
Glucose, Bld: 174 mg/dL — ABNORMAL HIGH (ref 70–99)
Potassium: 3.8 mmol/L (ref 3.5–5.1)
Sodium: 133 mmol/L — ABNORMAL LOW (ref 135–145)

## 2020-03-31 LAB — GLUCOSE, CAPILLARY
Glucose-Capillary: 164 mg/dL — ABNORMAL HIGH (ref 70–99)
Glucose-Capillary: 167 mg/dL — ABNORMAL HIGH (ref 70–99)
Glucose-Capillary: 162 mg/dL — ABNORMAL HIGH (ref 70–99)
Glucose-Capillary: 189 mg/dL — ABNORMAL HIGH (ref 70–99)

## 2020-03-31 MED ORDER — CYCLOBENZAPRINE HCL 5 MG PO TABS
5.0000 mg | ORAL_TABLET | Freq: Three times a day (TID) | ORAL | Status: DC | PRN
  Administered 2020-03-31 – 2020-04-02 (×5): 5 mg via ORAL
  Filled 2020-03-31 (×6): qty 1

## 2020-03-31 NOTE — Telephone Encounter (Signed)
Please advise 

## 2020-03-31 NOTE — H&P (Incomplete)
Physical Medicine and Rehabilitation Admission H&P    CC: Functional deficits due to right hip fracture and R-RTC repair.   HPI: [name] is a 70 year old male with history of CAD s/p stent, T2DM, HTN, lumbar radiculopathy s/p ESI 01/2020, gastric bypass, R-BKA, right shoulder pain who underwent RTC repair by Dr. [name] on 03/23/20.  He sustained a fall onto his hip while trying to move his scooter with onset of pain and inability to walk. He was evaluated in ED 03/05 and found to have  on right femoral neck fracture. He underwent posterior R-THR on 03/07 by Dr. [name].  Is WBAT RLE with use of prosthesis and on Lovenox for DVT prophylaxis. Post op has had issues with mild hyponatremia as well as ABLA. Reactive leucocytosis has resolved. Therapy evaluations completed revealing post op pain, difficulty following THR precautions, impaired balance and inability to stand/wear prosthesis due to edema. CIR recommended due to functional decline.   ROS   Past Medical History:  Diagnosis Date  . Anemia    low iron  . Arthritis   . Coronary artery disease 2006   a. remote stenting to ramus, prox LAD x2.  . Dental crowns present   . Diabetes mellitus   . First degree AV block   . GERD (gastroesophageal reflux disease)   . Heart attack ([place]) 06/2004  . Heart murmur    aortic  . Hx MRSA infection 02/2006  . Hyperlipidemia   . Hypertension    hx. of - has not been on med. since losing wt. after gastric bypass  . Hypothyroidism   . Morbid obesity ([place])   . Mucoid cyst of joint 09/2011   left thumb  . Sleep apnea    sleep study 03/29/2011; no CPAP use, lost >130lbs  . Trifascicular block     Past Surgical History:  Procedure Laterality Date  . AMPUTATION Left 04/19/2016   Procedure: Left Foot 4th Toe Amputation vs. Transmetatarsal;  Surgeon: [name], MD;  Location: [facility];  Service: Orthopedics;  Laterality: Left;  . BIOPSY  04/07/2019   Procedure: BIOPSY;  Surgeon: Otis Brace, MD;   Location: [facility];  Service: Gastroenterology;;  . CATARACT EXTRACTION  02/2010; 03/2010  . COLONOSCOPY WITH PROPOFOL N/A 09/14/2014   Procedure: COLONOSCOPY WITH PROPOFOL;  Surgeon: Garlan Fair, MD;  Location: [facility];  Service: Endoscopy;  Laterality: N/A;  . COLONOSCOPY WITH PROPOFOL N/A 04/07/2019   Procedure: COLONOSCOPY WITH PROPOFOL;  Surgeon: Otis Brace, MD;  Location: [facility];  Service: Gastroenterology;  Laterality: N/A;  . CORONARY ANGIOPLASTY WITH STENT PLACEMENT  07/12/2004; 08/03/2004   total of 3 stents  . I & D EXTREMITY Right 06/04/2014   Procedure: IRRIGATION AND DEBRIDEMENT EXTREMITY;  Surgeon: [name], MD;  Location: [place] [facility];  Service: Orthopedics;  Laterality: Right;  . MASS EXCISION  10/04/2011   Procedure: EXCISION MASS;  Surgeon: Wynonia Sours, MD;  Location: [place];  Service: Orthopedics;  Laterality: Left;  excision cyst debridment ip joint of left thumb  . PROSTATECTOMY    . right below knee amputation  2018  . ROUX-EN-Y GASTRIC BYPASS  10/10/2010   laparoscopic  . SHOULDER ARTHROSCOPY WITH ROTATOR CUFF REPAIR AND SUBACROMIAL DECOMPRESSION Right 03/23/2020   Procedure: RIGHT SHOULDER ARTHROSCOPY WITH DEBRIDEMENT AND  ROTATOR CUFF REPAIR;  Surgeon: [name], MD;  Location: [place];  Service: Orthopedics;  Laterality: Right;  . TOE AMPUTATION  02/23/2006   left  foot second ray amputation  . TOTAL HIP ARTHROPLASTY Right 03/29/2020   Procedure: TOTAL HIP ARTHROPLASTY POSTERIOR APPROACH;  Surgeon: [name], MD;  Location: [facility];  Service: Orthopedics;  Laterality: Right;  . TRANSMETATARSAL AMPUTATION Left   . TRIGGER FINGER RELEASE Right 09/16/2019   Procedure: RELEASE TRIGGER FINGER/A-1 PULLEY;  Surgeon: Daryll Brod, MD;  Location: [facility];  Service: Orthopedics;  Laterality: Right;  IV REGIONAL FOREARM BLOCK    Family History  Problem Relation Age of Onset  . Heart disease Mother      Social History:  reports that he has never smoked. He has never used smokeless tobacco. He reports that he does not drink alcohol and does not use drugs.   Allergies  Allergen Reactions  . Bactrim [Sulfamethoxazole-Trimethoprim] Itching and Rash  . Moxifloxacin Rash    Avelox Rash at injection site  . Penicillins Rash     Has patient had a PCN reaction causing immediate rash, facial/tongue/throat swelling, SOB or lightheadedness with hypotension: #  #  #  YES  #  #  #  Has patient had a PCN reaction causing severe rash involving mucus membranes or skin necrosis: No Has patient had a PCN reaction that required hospitalization unknown Has patient had a PCN reaction occurring within the last 10 years: No If all of the above answers are "NO", then may proceed with Cephalosporin use.  . Quinolones Itching   Medications Prior to Admission  Medication Sig Dispense Refill  . aspirin EC 81 MG tablet Take 81 mg by mouth daily.    Marland Kitchen atorvastatin (LIPITOR) 10 MG tablet Take 10 mg by mouth at bedtime.     Marland Kitchen CALCIUM CITRATE PO Take 500 mg by mouth 3 (three) times daily.    . Cyanocobalamin (B-12) 1000 MCG SUBL Place 1,000 mcg under the tongue every Monday, Wednesday, and Friday.    . ferrous sulfate 325 (65 FE) MG tablet Take 325 mg by mouth daily.     . fluticasone (FLONASE) 50 MCG/ACT nasal spray Place 2 sprays into both nostrils daily as needed for allergies.     Marland Kitchen ibuprofen (ADVIL,MOTRIN) 200 MG tablet Take 400 mg by mouth every 6 (six) hours as needed for headache (pain).    Marland Kitchen levothyroxine (SYNTHROID) 150 MCG tablet Take 150 mcg by mouth daily before breakfast.    . lisinopril (ZESTRIL) 2.5 MG tablet Take 1 tablet (2.5 mg total) by mouth daily. 90 tablet 3  . Melatonin 10 MG TABS Take 10 mg by mouth at bedtime.    . metFORMIN (GLUCOPHAGE-XR) 500 MG 24 hr tablet Take 500 mg by mouth 2 (two) times daily.    . Multiple Vitamin (MULTIVITAMIN WITH MINERALS) TABS tablet Take 2 tablets by mouth  in the morning and at bedtime. Gummies    . Multiple Vitamins-Minerals (PRESERVISION AREDS 2) CAPS Take 1 capsule by mouth 2 (two) times daily.    . nabumetone (RELAFEN) 750 MG tablet Take 1 tablet (750 mg total) by mouth 2 (two) times daily as needed. (Patient taking differently: Take 750 mg by mouth 2 (two) times daily.) 60 tablet 1  . Omega-3 Fatty Acids (FISH OIL TRIPLE STRENGTH PO) Take 57 mg by mouth daily. Chewable    . Omeprazole 20 MG TBEC Take 20 mg by mouth daily as needed (acid reflux/ indigestion).     Marland Kitchen OVER THE COUNTER MEDICATION Take 1 tablet by mouth 2 (two) times daily. [facility] Complex: glucosamine/cat's claw extract    .  OVER THE COUNTER MEDICATION Take 30 mg by mouth at bedtime. CBD    . oxyCODONE (ROXICODONE) 5 MG immediate release tablet Take 1-2 tablets (5-10 mg total) by mouth every 6 (six) hours as needed for severe pain. 30 tablet 0  . Probiotic Product (PROBIOTIC PO) Take 1 tablet by mouth every evening.    Marland Kitchen tiZANidine (ZANAFLEX) 4 MG tablet Take 1 tablet (4 mg total) by mouth every 8 (eight) hours as needed for muscle spasms. (Patient taking differently: Take 4 mg by mouth at bedtime as needed for muscle spasms.) 40 tablet 1  . Hypromellose (GENTEAL SEVERE OP) Place 1 drop into the right eye daily as needed (Dry eyes).    . nitroGLYCERIN (NITROSTAT) 0.4 MG SL tablet PLACE 1 TABLET (0.4 MG TOTAL) UNDER THE TONGUE EVERY 5 (FIVE) MINUTES X 3 DOSES AS NEEDED FOR CHEST PAIN. 25 tablet 3  . sildenafil (REVATIO) 20 MG tablet Take 40-100 mg by mouth daily as needed (Erectile Dysfunction).       Drug Regimen Review { DRUG REGIMEN [id number]}  Home: Home Living Family/patient expects to be discharged to:: Private residence Living Arrangements: Spouse/significant other Available Help at Discharge: Family,Available PRN/intermittently Type of Home: House Home Access: Stairs to enter [employer] of Steps: 2 Entrance Stairs-Rails: None Home Layout:  Two level,Able to live on main level with bedroom/bathroom Bathroom Shower/Tub: Multimedia programmer: Handicapped height Home Equipment: Administrator, arts (comment),Walker - 2 wheels,Cane - single point (knee walker)   Functional History: Prior Function Level of Independence: Independent with assistive device(s) Comments: Prior to RTC scope, pt Ind with ADLs ambulatory with prosthesis donned without AD. Pt uses RW for short distances when prosthesis is doffed. Since RTC repair, pt has required assist with ADLs/selfcare and has been using prosthesis and no AD, transfer level to/from electric scooter with prosthesis doffed  Functional Status:  Mobility: Bed Mobility Overal bed mobility: Needs Assistance Bed Mobility: Supine to Sit,Sit to Supine Supine to sit: Mod assist,+2 for physical assistance,HOB elevated Sit to supine: +2 for physical assistance,HOB elevated,Max assist General bed mobility comments: mod-max +2 for trunk elevation/lowering, translating RLE to/from EOB with cues for maintaining posterior hip precautions, scooting to and from EOB, and boost up in bed upon return to supine. Transfers General transfer comment: unable - RLE too swollen to don prosthesis, pt's wife to take prosthesis to prosthetist for adjustment      ADL: ADL Overall ADL's : Needs assistance/impaired Eating/Feeding: Set up,Sitting Grooming: Wash/dry hands,Wash/dry face,Min guard,Sitting Upper Body Bathing: Moderate assistance Lower Body Bathing: Maximal assistance Upper Body Dressing : Moderate assistance Lower Body Dressing: Total assistance Toilet Transfer Details (indicate cue type and reason): unable to don R LE prosthesis Toileting- Clothing Manipulation and Hygiene: Total assistance,Bed level General ADL Comments: Pt and wife educated on LB compensatory ADL techniques, reviewed sling wear and UB ADL selfcare  Cognition: Cognition Overall Cognitive Status: Within Functional  Limits for tasks assessed Orientation Level: Oriented X4 Cognition Arousal/Alertness: Awake/alert Behavior During Therapy: WFL for tasks assessed/performed Overall Cognitive Status: Within Functional Limits for tasks assessed General Comments: motivated, follows safely cues well  Physical Exam: Blood pressure 119/65, pulse 90, temperature 98.7 F (37.1 C), temperature source Oral, resp. rate 18, height 6\' 8"  (2.032 m), weight 111.1 kg, SpO2 94 %. Physical Exam  Results for orders placed or performed during the hospital encounter of 03/27/20 (from the past 48 hour(s))  Glucose, capillary     Status: Abnormal   Collection Time: 03/29/20  3:46  PM  Result Value Ref Range   Glucose-Capillary 183 (H) 70 - 99 mg/dL    Comment: Glucose reference range applies only to samples taken after fasting for at least 8 hours.  Glucose, capillary     Status: Abnormal   Collection Time: 03/29/20  4:57 PM  Result Value Ref Range   Glucose-Capillary 181 (H) 70 - 99 mg/dL    Comment: Glucose reference range applies only to samples taken after fasting for at least 8 hours.  CBC     Status: Abnormal   Collection Time: 03/29/20  5:56 PM  Result Value Ref Range   WBC 13.2 (H) 4.0 - 10.5 K/uL   RBC 4.70 4.22 - 5.81 MIL/uL   Hemoglobin 15.0 13.0 - 17.0 g/dL   HCT 43.1 39.0 - 52.0 %   MCV 91.7 80.0 - 100.0 fL   MCH 31.9 26.0 - 34.0 pg   MCHC 34.8 30.0 - 36.0 g/dL   RDW 13.9 11.5 - 15.5 %   Platelets 237 150 - 400 K/uL   nRBC 0.0 0.0 - 0.2 %    Comment: Performed at [facility], [place] [address], [place], [place] [address]  Creatinine, serum     Status: None   Collection Time: 03/29/20  5:56 PM  Result Value Ref Range   Creatinine, Ser 0.99 0.61 - 1.24 mg/dL   GFR, Estimated >60 >60 mL/min    Comment: (NOTE) Calculated using the CKD-EPI Creatinine Equation (2021) Performed at [facility] [address] [place], [place] [address]   Glucose, capillary     Status: Abnormal   Collection  Time: 03/29/20  7:37 PM  Result Value Ref Range   Glucose-Capillary 368 (H) 70 - 99 mg/dL    Comment: Glucose reference range applies only to samples taken after fasting for at least 8 hours.  Glucose, capillary     Status: Abnormal   Collection Time: 03/30/20 12:06 AM  Result Value Ref Range   Glucose-Capillary 224 (H) 70 - 99 mg/dL    Comment: Glucose reference range applies only to samples taken after fasting for at least 8 hours.  Glucose, capillary     Status: Abnormal   Collection Time: 03/30/20  5:20 AM  Result Value Ref Range   Glucose-Capillary 154 (H) 70 - 99 mg/dL    Comment: Glucose reference range applies only to samples taken after fasting for at least 8 hours.  Glucose, capillary     Status: Abnormal   Collection Time: 03/30/20  8:23 AM  Result Value Ref Range   Glucose-Capillary 206 (H) 70 - 99 mg/dL    Comment: Glucose reference range applies only to samples taken after fasting for at least 8 hours.  CBC     Status: Abnormal   Collection Time: 03/30/20  9:16 AM  Result Value Ref Range   WBC 10.3 4.0 - 10.5 K/uL   RBC 4.17 (L) 4.22 - 5.81 MIL/uL   Hemoglobin 13.0 13.0 - 17.0 g/dL   HCT 38.6 (L) 39.0 - 52.0 %   MCV 92.6 80.0 - 100.0 fL   MCH 31.2 26.0 - 34.0 pg   MCHC 33.7 30.0 - 36.0 g/dL   RDW 14.2 11.5 - 15.5 %   Platelets 202 150 - 400 K/uL   nRBC 0.0 0.0 - 0.2 %    Comment: Performed at [facility], [place] [address], [place], [place] [address]  Basic metabolic panel     Status: Abnormal   Collection  Time: 03/30/20  9:16 AM  Result Value Ref Range   Sodium 134 (L) 135 - 145 mmol/L   Potassium 3.8 3.5 - 5.1 mmol/L   Chloride 101 98 - 111 mmol/L   CO2 23 22 - 32 mmol/L   Glucose, Bld 186 (H) 70 - 99 mg/dL    Comment: Glucose reference range applies only to samples taken after fasting for at least 8 hours.   BUN 16 8 - 23 mg/dL   Creatinine, Ser 0.94 0.61 - 1.24 mg/dL   Calcium 8.4 (L) 8.9 - 10.3 mg/dL   GFR, Estimated >60 >60 mL/min    Comment:  (NOTE) Calculated using the CKD-EPI Creatinine Equation (2021)    Anion gap 10 5 - 15    Comment: Performed at [facility] [address] [place], [place] [address]  Glucose, capillary     Status: Abnormal   Collection Time: 03/30/20 11:35 AM  Result Value Ref Range   Glucose-Capillary 158 (H) 70 - 99 mg/dL    Comment: Glucose reference range applies only to samples taken after fasting for at least 8 hours.  Glucose, capillary     Status: Abnormal   Collection Time: 03/30/20  5:47 PM  Result Value Ref Range   Glucose-Capillary 192 (H) 70 - 99 mg/dL    Comment: Glucose reference range applies only to samples taken after fasting for at least 8 hours.  Glucose, capillary     Status: Abnormal   Collection Time: 03/30/20  7:16 PM  Result Value Ref Range   Glucose-Capillary 245 (H) 70 - 99 mg/dL    Comment: Glucose reference range applies only to samples taken after fasting for at least 8 hours.  CBC     Status: Abnormal   Collection Time: 03/31/20  5:23 AM  Result Value Ref Range   WBC 9.0 4.0 - 10.5 K/uL   RBC 3.98 (L) 4.22 - 5.81 MIL/uL   Hemoglobin 12.5 (L) 13.0 - 17.0 g/dL   HCT 36.0 (L) 39.0 - 52.0 %   MCV 90.5 80.0 - 100.0 fL   MCH 31.4 26.0 - 34.0 pg   MCHC 34.7 30.0 - 36.0 g/dL   RDW 13.9 11.5 - 15.5 %   Platelets 181 150 - 400 K/uL   nRBC 0.0 0.0 - 0.2 %    Comment: Performed at [facility], [place] [address] [place], [place] [address]  Basic metabolic panel     Status: Abnormal   Collection Time: 03/31/20  5:23 AM  Result Value Ref Range   Sodium 133 (L) 135 - 145 mmol/L   Potassium 3.8 3.5 - 5.1 mmol/L   Chloride 103 98 - 111 mmol/L   CO2 20 (L) 22 - 32 mmol/L   Glucose, Bld 174 (H) 70 - 99 mg/dL    Comment: Glucose reference range applies only to samples taken after fasting for at least 8 hours.   BUN 14 8 - 23 mg/dL   Creatinine, Ser 0.82 0.61 - 1.24 mg/dL   Calcium 8.4 (L) 8.9 - 10.3 mg/dL   GFR, Estimated >60 >60 mL/min    Comment:  (NOTE) Calculated using the CKD-EPI Creatinine Equation (2021)    Anion gap 10 5 - 15    Comment: Performed at [facility] [address], [place], [place] [address]  Glucose, capillary     Status: Abnormal   Collection Time: 03/31/20  6:50 AM  Result Value Ref Range   Glucose-Capillary 164 (H) 70 -  99 mg/dL    Comment: Glucose reference range applies only to samples taken after fasting for at least 8 hours.   Pelvis Portable  Result Date: 03/29/2020 CLINICAL DATA:  Status post right hip replacement EXAM: PORTABLE PELVIS 1-2 VIEWS COMPARISON:  03/27/2020 FINDINGS: Right hip replacement is now seen in satisfactory position. No acute bony abnormality or soft tissue abnormality is noted. IMPRESSION: Right hip replacement in satisfactory position. Electronically Signed   By: Inez Catalina M.D.   On: 03/29/2020 17:09       Medical Problem List and Plan: 1.  *** secondary to ***  -patient may *** shower  -ELOS/Goals: *** 2.  Antithrombotics: -DVT/anticoagulation:  Pharmaceutical: Lovenox  -antiplatelet therapy: resume low dose ASA. 3. Pain Management: Oxycodone prn  4. Mood: LCSW to follow for evaluation and support.   -antipsychotic agents: N/A 5. Neuropsych: This patient is capable of making decisions on his own behalf. 6. Skin/Wound Care: Monitor incisions for healing.  7. Fluids/Electrolytes/Nutrition: Monitor I/O. Check lytes in am. 8. R-THR: Posterior hip precautions with WBAT w/prosthesis.   --eoncourage elevation/compression of residual limb for edema management . 9. T2DM: Monitor BS ac/hs. Continue SSI for elevated BS.  --resume metformin.  10. Hyponatremia: Recheck BMET in am.  11. ABLA: Monitor for trend--recheck CBC in am.  --continue iron supplement.  12. HTN: Monitor BP tid --On lisinopril.  13. R-RTC repair 03/23/20: NWB RUE.  14. Constipation:         ***  Bary Leriche, PA-C 03/31/2020

## 2020-03-31 NOTE — Progress Notes (Addendum)
PROGRESS NOTE    [name]   NAT:[id number]  DOB: 11/06/1950  PCP: [name], MD    DOA: 03/27/2020 LOS: 4   Brief Narrative   Patient is a 70 year old Caucasian male with past medical history significant for hypertension, hyperlipidemia, diabetes mellitus type 2, OSA off of CPAP, status post gastric bypass in 2012, GERD, status post right BKA secondary to complications of diabetes mellitus, recent right shoulder surgery for full-thickness rotator cuff tear (within the last week) and iron deficiency anemia. Patient was discharged from the hospital on Tuesday after rotator cuff surgery. Patient fell at home on the same day of discharge. Right hip pain gradually got worse. Patient eventually presented to the hospital and was found to have right hip fracture.  He transferred to [facility] and underwent R hip arthroplasty with orthopedic surgery on 3/7.  Assessment & Plan   Active Problems:   Hypothyroidism   Type 2 diabetes mellitus with vascular disease (HCC)   HYPERLIPIDEMIA, MIXED   ANEMIA, IRON DEFICIENCY, HX OF   CAD (coronary artery disease)   Chronic diastolic CHF (congestive heart failure) (HCC)   S/P transmetatarsal amputation of foot, left (HCC)   Hip fracture (HCC)   Closed fracture of neck of right femur (HCC)   Right hip fracture -due to mechanical fall at home.  Postop day 2.   --Continue pain control, bowel regimen.   --Muscle relaxer changed Robaxin>>trial Flexeril due to little relief w/Robaxin --PT and OT recommend CIR, consult pending --Ortho recommended WBAT on RLE with use of prosthesis.  --Lovenox for DVT prophylaxis --Follow-up with Dr. [name] in 2 weeks  Status post right BKA -stable, due to diabetes complications.  Patient currently has more swelling than his baseline in the stump.  Wearing stump shrinker.  Recent right rotator cuff tear status post surgical repair -right upper extremity in sling.  Continue as needed pain meds.  OT.  Constipation  -continue bowel regimen.  As needed suppository or enema.  Hypertension -chronic, stable.  Continue lisinopril  Hyperlipidemia -continue statin  Hypothyroidism -continue Synthroid  GERD -continue Protonix  Type 2 diabetes -well controlled, A1c 6.8%.  Continue sliding scale NovoLog.  Coronary artery disease -stable with no active chest pain.  Continue statin and ACE inhibitor.  Chronic diastolic CHF -currently euvolemic on exam well compensated.  Not on diuretics outpatient.  Continue statin and ACE inhibitor.    Patient BMI: Body mass index is 26.91 kg/m.   DVT prophylaxis: enoxaparin (LOVENOX) injection 40 mg Start: 03/30/20 0800 SCDs Start: 03/29/20 1737 SCDs Start: 03/27/20 2145   Diet:  Diet Orders (From admission, onward)    Start     Ordered   03/29/20 1737  Diet Carb Modified Fluid consistency: Thin; Room service appropriate? Yes  Diet effective now       Question Answer Comment  Calorie Level Medium 1600-2000   Fluid consistency: Thin   Room service appropriate? Yes      03/29/20 1736            Code Status: Full Code    Subjective 03/31/20    Patient seen this morning reports some spasm and cramping in the right thigh with some pain at the lateral hip.  Very poor sleep overnight.  Has not had BM since surgery.   Disposition Plan & Communication   Status is: Inpatient  Remains inpatient appropriate because:CIR evaluation for rehab is pending   Dispo: The patient is from: Home  Anticipated d/c is to: CIR              Patient currently is medically stable to d/c.   Difficult to place patient No   Family Communication: None at bedside, will attempt to call wife   Consults, Procedures, Significant Events   Consultants:   Orthopedics  Procedures:   3/7 -right hip arthroplasty  Antimicrobials:  Anti-infectives (From admission, onward)   Start     Dose/Rate Route Frequency Ordered Stop   03/29/20 1830  ceFAZolin (ANCEF) IVPB  2g/100 mL premix        2 g 200 mL/hr over 30 Minutes Intravenous Every 6 hours 03/29/20 1736 03/30/20 0532   03/29/20 1411  vancomycin (VANCOCIN) powder  Status:  Discontinued          As needed 03/29/20 1412 03/29/20 1557   03/29/20 1115  ceFAZolin (ANCEF) IVPB 2g/100 mL premix        2 g 200 mL/hr over 30 Minutes Intravenous On call to O.R. 03/28/20 1633 03/29/20 1321        Micro    Objective   Vitals:   03/30/20 1131 03/30/20 1241 03/31/20 0359 03/31/20 0743  BP: 109/70  104/76 119/65  Pulse: 69  68 90  Resp: 16  17 18   Temp: 97.7 F (36.5 C) 98.2 F (36.8 C) 98.9 F (37.2 C) 98.7 F (37.1 C)  TempSrc: Oral Axillary Oral Oral  SpO2: 98%  98% 94%  Weight:      Height:        Intake/Output Summary (Last 24 hours) at 03/31/2020 1212 Last data filed at 03/31/2020 0500 Gross per 24 hour  Intake --  Output 550 ml  Net -550 ml   Filed Weights   03/27/20 2100 03/29/20 1150  Weight: 111.1 kg 111.1 kg    Physical Exam:  General exam: awake, alert, no acute distress HEENT: moist mucus membranes, hearing grossly normal  Respiratory system: CTAB, no wheezes, rales or rhonchi, normal respiratory effort. Cardiovascular system: normal S1/S2, RRR Gastrointestinal system: soft, NT, ND, hypoactive bowel sounds. Central nervous system: A&O x4. no gross focal neurologic deficits, normal speech Extremities: R BKA with stump shrinker in place, R lateral hip incision site intact without surrounding warmth erythema or swelling Psychiatric: normal mood, congruent affect, judgement and insight appear normal  Labs   Data Reviewed: I have personally reviewed following labs and imaging studies  CBC: Recent Labs  Lab 03/27/20 1716 03/28/20 0256 03/29/20 1756 03/30/20 0916 03/31/20 0523  WBC 9.1 7.7 13.2* 10.3 9.0  NEUTROABS 7.3  --   --   --   --   HGB 14.2 13.9 15.0 13.0 12.5*  HCT 41.8 41.2 43.1 38.6* 36.0*  MCV 92.3 92.6 91.7 92.6 90.5  PLT 186 194 237 202 623   Basic  Metabolic Panel: Recent Labs  Lab 03/27/20 1716 03/28/20 0256 03/29/20 1756 03/30/20 0916 03/31/20 0523  NA 137 139  --  134* 133*  K 3.7 3.5  --  3.8 3.8  CL 103 105  --  101 103  CO2 24 24  --  23 20*  GLUCOSE 145* 107*  --  186* 174*  BUN 17 17  --  16 14  CREATININE 1.05 0.79 0.99 0.94 0.82  CALCIUM 9.1 8.8*  --  8.4* 8.4*   GFR: Estimated Creatinine Clearance: 112.2 mL/min (by C-G formula based on SCr of 0.82 mg/dL). Liver Function Tests: Recent Labs  Lab 03/27/20 1716 03/28/20 0256  AST 21  18  ALT 18 17  ALKPHOS 49 46  BILITOT 1.4* 1.2  PROT 6.7 6.5  ALBUMIN 3.7 3.5  3.5   No results for input(s): LIPASE, AMYLASE in the last 168 hours. No results for input(s): AMMONIA in the last 168 hours. Coagulation Profile: Recent Labs  Lab 03/27/20 1716  INR 1.1   Cardiac Enzymes: No results for input(s): CKTOTAL, CKMB, CKMBINDEX, TROPONINI in the last 168 hours. BNP (last 3 results) No results for input(s): PROBNP in the last 8760 hours. HbA1C: No results for input(s): HGBA1C in the last 72 hours. CBG: Recent Labs  Lab 03/30/20 0823 03/30/20 1135 03/30/20 1747 03/30/20 1916 03/31/20 0650  GLUCAP 206* 158* 192* 245* 164*   Lipid Profile: No results for input(s): CHOL, HDL, LDLCALC, TRIG, CHOLHDL, LDLDIRECT in the last 72 hours. Thyroid Function Tests: No results for input(s): TSH, T4TOTAL, FREET4, T3FREE, THYROIDAB in the last 72 hours. Anemia Panel: No results for input(s): VITAMINB12, FOLATE, FERRITIN, TIBC, IRON, RETICCTPCT in the last 72 hours. Sepsis Labs: No results for input(s): PROCALCITON, LATICACIDVEN in the last 168 hours.  Recent Results (from the past 240 hour(s))  Resp Panel by RT-PCR (Flu A&B, Covid) Nasopharyngeal Swab     Status: None   Collection Time: 03/27/20  5:16 PM   Specimen: Nasopharyngeal Swab; Nasopharyngeal(NP) swabs in vial transport medium  Result Value Ref Range Status   SARS Coronavirus 2 by RT PCR NEGATIVE NEGATIVE Final     Comment: (NOTE) SARS-CoV-2 target nucleic acids are NOT DETECTED.  The SARS-CoV-2 RNA is generally detectable in upper respiratory specimens during the acute phase of infection. The lowest concentration of SARS-CoV-2 viral copies this assay can detect is 138 copies/mL. A negative result does not preclude SARS-Cov-2 infection and should not be used as the sole basis for treatment or other patient management decisions. A negative result may occur with  improper specimen collection/handling, submission of specimen other than nasopharyngeal swab, presence of viral mutation(s) within the areas targeted by this assay, and inadequate number of viral copies(<138 copies/mL). A negative result must be combined with clinical observations, patient history, and epidemiological information. The expected result is Negative.  Fact Sheet for Patients:  EntrepreneurPulse.com.au  Fact Sheet for Healthcare Providers:  IncredibleEmployment.be  This test is no t yet approved or cleared by the [place] FDA and  has been authorized for detection and/or diagnosis of SARS-CoV-2 by FDA under an Emergency Use Authorization (EUA). This EUA will remain  in effect (meaning this test can be used) for the duration of the COVID-19 declaration under Section 564(b)(1) of the Act, 21 U.S.C.section 360bbb-3(b)(1), unless the authorization is terminated  or revoked sooner.       Influenza A by PCR NEGATIVE NEGATIVE Final   Influenza B by PCR NEGATIVE NEGATIVE Final    Comment: (NOTE) The Xpert Xpress SARS-CoV-2/FLU/RSV plus assay is intended as an aid in the diagnosis of influenza from Nasopharyngeal swab specimens and should not be used as a sole basis for treatment. Nasal washings and aspirates are unacceptable for Xpert Xpress SARS-CoV-2/FLU/RSV testing.  Fact Sheet for Patients: EntrepreneurPulse.com.au  Fact Sheet for Healthcare  Providers: IncredibleEmployment.be  This test is not yet approved or cleared by the [place] FDA and has been authorized for detection and/or diagnosis of SARS-CoV-2 by FDA under an Emergency Use Authorization (EUA). This EUA will remain in effect (meaning this test can be used) for the duration of the COVID-19 declaration under Section 564(b)(1) of the Act, 21 U.S.C. section 360bbb-3(b)(1),  unless the authorization is terminated or revoked.  Performed at [facility], [place] [address], [place], [place] [address]   Surgical pcr screen     Status: None   Collection Time: 03/28/20  2:41 PM   Specimen: Nasal Mucosa; Nasal Swab  Result Value Ref Range Status   MRSA, PCR NEGATIVE NEGATIVE Final   Staphylococcus aureus NEGATIVE NEGATIVE Final    Comment: (NOTE) The Xpert SA Assay (FDA approved for NASAL specimens in patients 41 years of age and older), is one component of a comprehensive surveillance program. It is not intended to diagnose infection nor to guide or monitor treatment. Performed at [facility], [place] [address], [place], [place] [address]       Imaging Studies   Pelvis Portable  Result Date: 03/29/2020 CLINICAL DATA:  Status post right hip replacement EXAM: PORTABLE PELVIS 1-2 VIEWS COMPARISON:  03/27/2020 FINDINGS: Right hip replacement is now seen in satisfactory position. No acute bony abnormality or soft tissue abnormality is noted. IMPRESSION: Right hip replacement in satisfactory position. Electronically Signed   By: Inez Catalina M.D.   On: 03/29/2020 17:09     Medications   Scheduled Meds: . atorvastatin  10 mg Oral QHS  . calcium carbonate  500 mg of elemental calcium Oral TID WC  . docusate sodium  100 mg Oral BID  . enoxaparin (LOVENOX) injection  40 mg Subcutaneous Q24H  . ferrous sulfate  325 mg Oral Daily  . insulin aspart  0-9 Units Subcutaneous TID WC & HS  . levothyroxine  150 mcg Oral Q0600  .  lisinopril  2.5 mg Oral Daily  . multivitamin with minerals  1 tablet Oral Daily  . pantoprazole  40 mg Oral Daily  . polyethylene glycol  17 g Oral Daily   Continuous Infusions: . sodium chloride 75 mL/hr at 03/29/20 1806       LOS: 4 days    Time spent: 25 minutes     Ezekiel Slocumb, DO [facility]  03/31/2020, 12:12 PM      If 7PM-7AM, please contact night-coverage. How to contact the [facility] Attending or Consulting provider [place] or covering provider during after hours [place], for this patient?    1. Check the care team in [facility] and look for a) attending/consulting [facility] provider listed and b) the [facility] team listed 2. Log into [email or web address] and use [place] universal password to access. If you do not have the password, please contact the hospital operator. 3. Locate the [facility] provider you are looking for under [facility] and page to a number that you can be directly reached. 4. If you still have difficulty reaching the provider, please page the [facility] (Director on Call) for the Hospitalists listed on amion for assistance.

## 2020-03-31 NOTE — Progress Notes (Signed)
Inpatient Rehabilitation Admissions Coordinator  Inpatient rehab consult received. I met with patient at bedside with his wife as well as dicussed with Dr. Delice Lesch. I called Dr. Jean Rosenthal to clarify weight bearing restrictions to his right arm. There are no weight bearing restrictions per [name]. Patient does not have the medical neccesity for a CIR admit and SNF therefore is recommended. Patient and wife are  in agreement. I will alert acute team and TOC. We will sign off at this time.  Danne Baxter, RN, MSN Rehab Admissions Coordinator [phone] 03/31/2020 1:18 PM

## 2020-03-31 NOTE — Care Management Important Message (Signed)
Important Message  Patient Details  Name: [name] MRN: [id number] Date of Birth: 02/06/1950   Medicare Important Message Given:  Yes     Megan P Shular 03/31/2020, 3:03 PM

## 2020-03-31 NOTE — Progress Notes (Signed)
Physical Therapy Treatment Patient Details Name: [name] MRN: [id number] DOB: 1950/08/17 Today's Date: 03/31/2020    History of Present Illness 70 yo male presents to ED on 3/5 with R femoral neck fracture after fall on 3/1 s/p R RTC repair when transferring to electric scooter. s/p R THA posterior approach on 3/7. PMH includes R shoulder arthroscopy with debridement and arthroscopically-assisted rotator cuff repair on 03/23/20, gastric bypass 2012, OA, CAD with MI and previous stenting, GERD, first degree AV block, DM, HLD, HTN, L 2nd and 4th toe progressing to transmet amputation, R BKA 2018.    PT Comments    Pt motivated to progress mobility OOB today. Pt tolerated x10 minutes EOB sitting without any external support, and scoot pivot to drop arm recliner with max +2. Pt's prosthesis is still not fitting secondary to residual limb swelling s/p THA, Pt's prosthetist contacted via telephone by this PT and am awaiting bedside consult. Recommendations changed to ST-SNF for rehabilitation secondary to CIR denial. PT to continue to follow acutely.    Follow Up Recommendations  SNF     Equipment Recommendations  None recommended by PT    Recommendations for Other Services       Precautions / Restrictions Precautions Precautions: Fall;Posterior Hip Precaution Booklet Issued: Yes (comment) Precaution Comments: reviewed precautions throughout mobility, pt able to teach back 2/3 Restrictions RUE Weight Bearing: Non weight bearing RLE Weight Bearing: Weight bearing as tolerated Other Position/Activity Restrictions: NWB R UE, WBAT R LE with prosthesis donned    Mobility  Bed Mobility Overal bed mobility: Needs Assistance Bed Mobility: Supine to Sit     Supine to sit: Mod assist;HOB elevated     General bed mobility comments: mod assist for LE translation to EOB, trunk elevation, scooting to EOB with bed pad, and cues for maintaining hip precautions.    Transfers Overall transfer  level: Needs assistance   Transfers: Lateral/Scoot Transfers          Lateral/Scoot Transfers: Max assist;+2 physical assistance;From elevated surface General transfer comment: max +2 for scoot pivot to L side only drop arm recliner, pt scooting towards R due to L drop arm. Assist for lateral hip translation, cues for L lateral leaning to offweight R hip. series of scoots, x4, to reach recliner.  Ambulation/Gait                 Stairs             Wheelchair Mobility    Modified Rankin (Stroke Patients Only)       Balance Overall balance assessment: Needs assistance;History of Falls Sitting-balance support: Single extremity supported;Feet unsupported Sitting balance-Leahy Scale: Fair Sitting balance - Comments: able to sit EOB without PT assist       Standing balance comment: unable to assess today                            Cognition Arousal/Alertness: Awake/alert Behavior During Therapy: WFL for tasks assessed/performed Overall Cognitive Status: Within Functional Limits for tasks assessed                                 General Comments: motivated, follows safely cues well, consistently follows multi-step commands      Exercises General Exercises - Lower Extremity Quad Sets: AAROM;Right;5 reps;Supine Hip ABduction/ADduction: AAROM;Right;10 reps;Seated    General Comments  Pertinent Vitals/Pain Pain Assessment: Faces Faces Pain Scale: Hurts little more Pain Location: R hip Pain Descriptors / Indicators: Discomfort;Grimacing;Sore Pain Intervention(s): Limited activity within patient's tolerance;Monitored during session;Repositioned    Home Living                      Prior Function            PT Goals (current goals can now be found in the care plan section) Acute Rehab PT Goals Patient Stated Goal: return to my independence PT Goal Formulation: With patient/family Time For Goal Achievement:  04/13/20 Potential to Achieve Goals: Good Progress towards PT goals: Progressing toward goals    Frequency    Min 3X/week      PT Plan Current plan remains appropriate    Co-evaluation              AM-PAC PT "6 Clicks" Mobility   Outcome Measure  Help needed turning from your back to your side while in a flat bed without using bedrails?: A Little Help needed moving from lying on your back to sitting on the side of a flat bed without using bedrails?: A Lot Help needed moving to and from a bed to a chair (including a wheelchair)?: A Lot Help needed standing up from a chair using your arms (e.g., wheelchair or bedside chair)?: A Lot Help needed to walk in hospital room?: Total Help needed climbing 3-5 steps with a railing? : Total 6 Click Score: 11    End of Session Equipment Utilized During Treatment: Gait belt;Other (comment) (RUE sling) Activity Tolerance: Patient tolerated treatment well;Patient limited by fatigue Patient left: in bed;with call bell/phone within reach;with bed alarm set;with family/visitor present;with SCD's reapplied Nurse Communication: Mobility status PT Visit Diagnosis: Difficulty in walking, not elsewhere classified (R26.2);History of falling (Z91.81)     Time: 1105-1150 PT Time Calculation (min) (ACUTE ONLY): 45 min  Charges:  $Therapeutic Activity: 23-37 mins $Neuromuscular Re-education: 8-22 mins                     Stacie Glaze, PT Acute Rehabilitation Services Pager [phone]  Office [phone]  Roxine Caddy E Ruffin Pyo 03/31/2020, 1:31 PM

## 2020-03-31 NOTE — Telephone Encounter (Signed)
Pamala Hurry with Inpatient Rehab.at [facility] would like to know patient's WB status/restrictions?  Cb# [phone].  Please advise. Thank you.

## 2020-03-31 NOTE — Plan of Care (Signed)
  Problem: Education: Goal: Knowledge of General Education information will improve Description: Including pain rating scale, medication(s)/side effects and non-pharmacologic comfort measures 03/31/2020 1250 by Reed Breech, RN Outcome: Progressing 03/31/2020 1250 by Reed Breech, RN Outcome: Progressing   Problem: Health Behavior/Discharge Planning: Goal: Ability to manage health-related needs will improve Outcome: Progressing   Problem: Clinical Measurements: Goal: Ability to maintain clinical measurements within normal limits will improve Outcome: Progressing

## 2020-03-31 NOTE — Progress Notes (Signed)
Subjective: 2 Days Post-Op Procedure(s) (LRB): TOTAL HIP ARTHROPLASTY POSTERIOR APPROACH (Right) Patient reports pain as mild.    Objective: Vital signs in last 24 hours: Temp:  [97.7 F (36.5 C)-98.9 F (37.2 C)] 98.7 F (37.1 C) (03/09 0743) Pulse Rate:  [68-90] 90 (03/09 0743) Resp:  [16-17] 17 (03/09 0359) BP: (104-119)/(65-76) 119/65 (03/09 0743) SpO2:  [94 %-98 %] 94 % (03/09 0743)  Intake/Output from previous day: 03/08 0701 - 03/09 0700 In: -  Out: 550 [Urine:550] Intake/Output this shift: No intake/output data recorded.  Recent Labs    03/29/20 1756 03/30/20 0916 03/31/20 0523  HGB 15.0 13.0 12.5*   Recent Labs    03/30/20 0916 03/31/20 0523  WBC 10.3 9.0  RBC 4.17* 3.98*  HCT 38.6* 36.0*  PLT 202 181   Recent Labs    03/30/20 0916 03/31/20 0523  NA 134* 133*  K 3.8 3.8  CL 101 103  CO2 23 20*  BUN 16 14  CREATININE 0.94 0.82  GLUCOSE 186* 174*  CALCIUM 8.4* 8.4*   No results for input(s): LABPT, INR in the last 72 hours.  Neurologically intact Incision: dressing C/D/I No cellulitis present   Assessment/Plan: 2 Days Post-Op Procedure(s) (LRB): TOTAL HIP ARTHROPLASTY POSTERIOR APPROACH (Right) Up with therapy WBAT RLE with prosthesis Patient to wear stump shrinker lovenox for dvt ppx F/u with Dr. [name] 2 weeks post-op     Aundra Dubin 03/31/2020, 8:29 AM

## 2020-04-01 DIAGNOSIS — I5032 Chronic diastolic (congestive) heart failure: Secondary | ICD-10-CM | POA: Diagnosis not present

## 2020-04-01 DIAGNOSIS — E1159 Type 2 diabetes mellitus with other circulatory complications: Secondary | ICD-10-CM | POA: Diagnosis not present

## 2020-04-01 DIAGNOSIS — S72001D Fracture of unspecified part of neck of right femur, subsequent encounter for closed fracture with routine healing: Secondary | ICD-10-CM | POA: Diagnosis not present

## 2020-04-01 LAB — CBC
HCT: 36 % — ABNORMAL LOW (ref 39.0–52.0)
Hemoglobin: 12.3 g/dL — ABNORMAL LOW (ref 13.0–17.0)
MCH: 31.4 pg (ref 26.0–34.0)
MCHC: 34.2 g/dL (ref 30.0–36.0)
MCV: 91.8 fL (ref 80.0–100.0)
Platelets: 205 10*3/uL (ref 150–400)
RBC: 3.92 MIL/uL — ABNORMAL LOW (ref 4.22–5.81)
RDW: 14 % (ref 11.5–15.5)
WBC: 8.6 10*3/uL (ref 4.0–10.5)
nRBC: 0 % (ref 0.0–0.2)

## 2020-04-01 LAB — GLUCOSE, CAPILLARY
Glucose-Capillary: 169 mg/dL — ABNORMAL HIGH (ref 70–99)
Glucose-Capillary: 160 mg/dL — ABNORMAL HIGH (ref 70–99)
Glucose-Capillary: 171 mg/dL — ABNORMAL HIGH (ref 70–99)
Glucose-Capillary: 244 mg/dL — ABNORMAL HIGH (ref 70–99)

## 2020-04-01 MED ORDER — ENSURE ENLIVE PO LIQD: 237.0000 mL | Freq: Three times a day (TID) | ORAL | Status: DC

## 2020-04-01 MED ORDER — MAGNESIUM OXIDE 400 (241.3 MG) MG PO TABS
400.0000 mg | ORAL_TABLET | Freq: Two times a day (BID) | ORAL | Status: DC
  Administered 2020-04-01 – 2020-04-02 (×3): 400 mg via ORAL
  Filled 2020-04-01 (×3): qty 1

## 2020-04-01 NOTE — Progress Notes (Signed)
Physical Therapy Treatment Patient Details Name: [name] MRN: [id number] DOB: 1950-09-01 Today's Date: 04/01/2020    History of Present Illness 70 yo male presents to ED on 3/5 with R femoral neck fracture after fall on 3/1 s/p R RTC repair when transferring to electric scooter. s/p R THA posterior approach on 3/7. PMH includes R shoulder arthroscopy with debridement and arthroscopically-assisted rotator cuff repair on 03/23/20, gastric bypass 2012, OA, CAD with MI and previous stenting, GERD, first degree AV block, DM, HLD, HTN, L 2nd and 4th toe progressing to transmet amputation, R BKA 2018.    PT Comments    Pt able to don prosthesis today, as orthotist visited pt at bedside on 3/9 and made needed adjustments to prosthesis. Pt overall requiring min-mod +2 assist for mobility tasks today, and progressed to short distance ambulation with use of RW. Per Dr. [name], pt is able to use RUE for RW use but both PT and OT encouraged pt to use RUE for balance on RW, and try to WB less through RUE vs LUE. PT continuing to recommend ST-SNF for rehabilitation post-acutely, will continue to follow.   Of note: redness along tibial surface of R residual limb, with pt complaints of burning sensation     Follow Up Recommendations  SNF     Equipment Recommendations  None recommended by PT    Recommendations for Other Services       Precautions / Restrictions Precautions Precautions: Fall;Posterior Hip Precaution Booklet Issued: Yes (comment) Precaution Comments: reviewed precautions throughout mobility, requires reinforcement for avoiding hip flexion >90 and hip IR Restrictions RUE Weight Bearing:  (WBAT when using RW) RLE Weight Bearing: Weight bearing as tolerated Other Position/Activity Restrictions: per Dr. Trevor Mace note on 3/10: "I told him he can come in and out of the sling to work on rotation of his right shoulder.  He understands that I do not want him lifting the arm above his head or  reaching behind him.  He can come out of the sling for using a walker as he rehabilitates his right hip"    Mobility  Bed Mobility Overal bed mobility: Needs Assistance Bed Mobility: Supine to Sit     Supine to sit: HOB elevated;Min assist     General bed mobility comments: min assist for progression of LEs over EOB, verbal cuing for sequencing task and pt able to scoot self to EOB. Verbal cuing for maintaining hip precautions.    Transfers Overall transfer level: Needs assistance Equipment used: Rolling walker (2 wheeled) Transfers: Sit to/from Stand Sit to Stand: Mod assist;+2 physical assistance         General transfer comment: mod +2 for power up, rise, and steady; VC for hand placement when rising/sitting, placing R hand in lap if possible during transitional movements to protect shoulder, and maintaining hip precautions during stand/sit (propping RLE out in front when moving stand>sit to maintain precautions). STS x2, from elevated EOB and from [facility].  Ambulation/Gait Ambulation/Gait assistance: Min assist;+2 physical assistance Gait Distance (Feet): 5 Feet Assistive device: Rolling walker (2 wheeled) Gait Pattern/deviations: Step-to pattern;Decreased step length - right;Antalgic;Trunk flexed Gait velocity: decr   General Gait Details: min assist to steady, verbal cuing for upright posture, placement of RLE, turning away from surgical side if possible to maintain neutral to hip ER   Stairs             Wheelchair Mobility    Modified Rankin (Stroke Patients Only)       Balance  Overall balance assessment: Needs assistance;History of Falls Sitting-balance support: Single extremity supported;Feet unsupported Sitting balance-Leahy Scale: Fair Sitting balance - Comments: able to sit EOB without PT assist     Standing balance-Leahy Scale: Poor Standing balance comment: reliant on external support                            Cognition  Arousal/Alertness: Awake/alert Behavior During Therapy: WFL for tasks assessed/performed Overall Cognitive Status: Within Functional Limits for tasks assessed                                 General Comments: motivated, follows safely cues well, consistently follows multi-step commands      Exercises      General Comments General comments (skin integrity, edema, etc.): redness along tibial surface of R residual limb, with pt complaints of burning sensation      Pertinent Vitals/Pain Pain Assessment: 0-10 Pain Score: 7  Pain Location: R hip Pain Descriptors / Indicators: Discomfort;Grimacing;Sore;Operative site guarding Pain Intervention(s): Limited activity within patient's tolerance;Monitored during session;Repositioned    Home Living                      Prior Function            PT Goals (current goals can now be found in the care plan section) Acute Rehab PT Goals Patient Stated Goal: return to my independence PT Goal Formulation: With patient/family Time For Goal Achievement: 04/13/20 Potential to Achieve Goals: Good Progress towards PT goals: Progressing toward goals    Frequency    Min 3X/week      PT Plan Current plan remains appropriate    Co-evaluation PT/OT/SLP Co-Evaluation/Treatment: Yes Reason for Co-Treatment: For patient/therapist safety;To address functional/ADL transfers PT goals addressed during session: Mobility/safety with mobility;Balance;Proper use of DME;Strengthening/ROM        AM-PAC PT "6 Clicks" Mobility   Outcome Measure  Help needed turning from your back to your side while in a flat bed without using bedrails?: A Little Help needed moving from lying on your back to sitting on the side of a flat bed without using bedrails?: A Lot Help needed moving to and from a bed to a chair (including a wheelchair)?: A Lot Help needed standing up from a chair using your arms (e.g., wheelchair or bedside chair)?: A  Little Help needed to walk in hospital room?: A Little Help needed climbing 3-5 steps with a railing? : A Lot 6 Click Score: 15    End of Session Equipment Utilized During Treatment: Gait belt Activity Tolerance: Patient tolerated treatment well;Patient limited by fatigue Patient left: with call bell/phone within reach;with family/visitor present;in chair Nurse Communication: Mobility status PT Visit Diagnosis: Difficulty in walking, not elsewhere classified (R26.2);History of falling (Z91.81)     Time: 1093-2355 PT Time Calculation (min) (ACUTE ONLY): 43 min  Charges:  $Gait Training: 8-22 mins $Therapeutic Activity: 8-22 mins                    Stacie Glaze, PT Acute Rehabilitation Services Pager [phone]  Office [phone]    Louis Matte 04/01/2020, 12:27 PM

## 2020-04-01 NOTE — TOC Initial Note (Signed)
Transition of Care [facility]) - Initial/Assessment Note    Patient Details  Name: [name] MRN: [id number] Date of Birth: August 06, 1950  Transition of Care [facility]) CM/SW Contact:    Amador Cunas,  Phone Number: 04/01/2020, 11:27 AM  Clinical Narrative: SW spoke with pt re PT recommendation for SNF. Pt reports agreeable to SNF for STR, no facility preference identified. Reviewed SNF placement process and answered questions. Will begin SNF search and f/u with offers as available. Pt's insurance will not require prior auth.   Wandra Feinstein, MSW, LCSW [phone] (coverage)                    Expected Discharge Plan: [place] Barriers to Discharge: No SNF bed   Patient Goals and CMS Choice   CMS Medicare.gov Compare Post Acute Care list provided to:: Patient Choice offered to / list presented to : Patient  Expected Discharge Plan and Services Expected Discharge Plan: [place]       Living arrangements for the past 2 months: Single Family Home                                      Prior Living Arrangements/Services Living arrangements for the past 2 months: Single Family Home Lives with:: Spouse Patient language and need for interpreter reviewed:: No        Need for Family Participation in Patient Care: No (Comment) Care giver support system in place?: No (comment)   Criminal Activity/Legal Involvement Pertinent to Current Situation/Hospitalization: No - Comment as needed  Activities of Daily Living Home Assistive Devices/Equipment: Therapist, nutritional (Comment) (knee scooter) ADL Screening (condition at time of admission) Patient's cognitive ability adequate to safely complete daily activities?: Yes Is the patient deaf or have difficulty hearing?: No Does the patient have difficulty seeing, even when wearing glasses/contacts?: No Does the patient have difficulty concentrating, remembering, or making decisions?:  No Patient able to express need for assistance with ADLs?: Yes Does the patient have difficulty dressing or bathing?: No Independently performs ADLs?: Yes (appropriate for developmental age) Does the patient have difficulty walking or climbing stairs?: No Weakness of Legs: None Weakness of Arms/Hands: None  Permission Sought/Granted Permission sought to share information with : Chartered certified accountant granted to share information with : Yes, Verbal Permission Granted              Emotional Assessment       Orientation: : Oriented to Self,Oriented to Place,Oriented to  Time,Oriented to Situation      Admission diagnosis:  Hip fracture ([place]) [S72.009A] Preop cardiovascular exam [Z01.810] Closed fracture of neck of right femur, initial encounter ([place]) [S72.001A] Patient Active Problem List   Diagnosis Date Noted  . Closed fracture of neck of right femur ([place])   . Hip fracture ([place]) 03/27/2020  . Nontraumatic complete tear of right rotator cuff 02/26/2020  . S/P transmetatarsal amputation of foot, left ([place]) 04/25/2016  . Chronic osteomyelitis of toe, left ([place])   . Cellulitis of toe of left foot   . Chest pain 09/25/2015  . CAD (coronary artery disease) of artery bypass graft 09/25/2015  . Sinus bradycardia on ECG 09/25/2015  . Chronic diastolic CHF (congestive heart failure) ([place]) 09/25/2015  . Chest pain at rest 09/25/2015  . Aortic valvular disorder 08/04/2014  . Disorder of mitral valve 08/04/2014  . Diabetic foot infection ([place]) 06/03/2014  . Peripheral vascular  disease ([place]) 06/03/2014  . Abscess of foot 06/03/2014  . CAD (coronary artery disease) 07/29/2013  . Trifascicular block 07/29/2013  . 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 Hypothyroidism 01/22/2007  . Type 2 diabetes mellitus with vascular disease ([place]) 01/22/2007  . HYPERLIPIDEMIA, MIXED 01/22/2007  . MYOCARDIAL INFARCTION, HX OF 01/22/2007  . DEGENERATIVE JOINT  DISEASE 01/22/2007  . ANEMIA, IRON DEFICIENCY, HX OF 01/22/2007   PCP:  [name], MD Pharmacy:   [facility] [address] [place] [address] Phone: [phone] Fax: [phone]     Social Determinants of Health (SDOH) Interventions    Readmission Risk Interventions No flowsheet data found.

## 2020-04-01 NOTE — Progress Notes (Signed)
Nutrition Follow-up  DOCUMENTATION CODES:   Not applicable  INTERVENTION:   -Continue MVI with minerals daily -Ensure Enlive po TID, each supplement provides 350 kcal and 20 grams of protein -Magic cup TID with meals, each supplement provides 290 kcal and 9 grams of protein  NUTRITION DIAGNOSIS:   Inadequate oral intake related to inability to eat as evidenced by NPO status.  Progressing; advanced to carb modified diet on 03/29/20  GOAL:   Patient will meet greater than or equal to 90% of their needs  Progressing   MONITOR:   Diet advancement  REASON FOR ASSESSMENT:   Consult Hip fracture protocol  ASSESSMENT:   70 yo male with a PMH of HLP, HTN, T2DM, s/p gastric bypass 2012, GERD, s/p R BKA, iron deficiency anemia who presents with R hip fx after a fall.  3/10- s/p Total hip replacement; Right hip; uncemented  Reviewed I/O's: -950 ml x 24 hours and -2.8 L since admission  UOP: 950 ml x 24 hours  Pt unavailable at time of visit.   Pt with good appetite. Noted meal completion 50-100%.   Medications reviewed and include calcium carbonate, colace, and miralax.   Per TOC notes, plan to d/c to SNF once medically stable.   Labs reviewed: CBGS: 160-189 (inpatient orders for glycemic control are 0-9 units insulin aspart QID).   Diet Order:   Diet Order            Diet Carb Modified Fluid consistency: Thin; Room service appropriate? Yes  Diet effective now                 EDUCATION NEEDS:   Education needs have been addressed  Skin:  Skin Assessment: Skin Integrity Issues: Skin Integrity Issues:: Incisions Incisions: closed rt shoulder, closed rt thigh  Last BM:  03/28/20  Height:   Ht Readings from Last 1 Encounters:  03/29/20 6\' 8"  (2.032 m)    Weight:   Wt Readings from Last 1 Encounters:  03/29/20 111.1 kg    Ideal Body Weight:  94.5 kg (Adjusted for R BKA)  BMI:  Body mass index is 26.91 kg/m.  Estimated Nutritional Needs:   Kcal:   2500-2700  Protein:  135-150 grams  Fluid:  >2 L    Loistine Chance, RD, LDN, [place] Registered Dietitian II Certified Diabetes Care and Education Specialist Please refer to [facility] for RD and/or RD on-call/weekend/after hours pager

## 2020-04-01 NOTE — Progress Notes (Signed)
Occupational Therapy Treatment Patient Details Name: [name] MRN: [id number] DOB: Mar 30, 1950 Today's Date: 04/01/2020    History of present illness 70 yo male presents to ED on 3/5 with R femoral neck fracture after fall on 3/1 s/p R RTC repair when transferring to electric scooter. s/p R THA posterior approach on 3/7. PMH includes R shoulder arthroscopy with debridement and arthroscopically-assisted rotator cuff repair on 03/23/20, gastric bypass 2012, OA, CAD with MI and previous stenting, GERD, first degree AV block, DM, HLD, HTN, L 2nd and 4th toe progressing to transmet amputation, R BKA 2018.   OT comments  Pt making good progress with functional goals. Pt now would like to pursue SNF options for ST rehab after acute d/c. SW made aware. Pt able to stand form EOB and transfer to [facility] mod +2. Pt able to don prosthesis today, as orthotist visited pt at bedside on 3/9 and made needed adjustments to prosthesis. Per Dr. [name], pt is able to use RUE for RW use but both therapies encouraged pt to use RUE for balance on RW, and try to WB less through RUE vs LUE. OT will continue to follow acutely to maximize level of function and safety  Of note: redness along tibial surface of R residual limb, with pt complaints of burning sensation   Follow Up Recommendations  SNF    Equipment Recommendations  None recommended by OT    Recommendations for Other Services      Precautions / Restrictions Precautions Precautions: Fall;Posterior Hip Precaution Booklet Issued: Yes (comment) Precaution Comments: reviewed precautions throughout mobility, requires reinforcement for avoiding hip flexion >90 and hip IR Restrictions Weight Bearing Restrictions: Yes RUE Weight Bearing:  (WBAT when using RW) RLE Weight Bearing: Weight bearing as tolerated Other Position/Activity Restrictions: per Dr. Trevor Mace note on 3/10: "I told him he can come in and out of the sling to work on rotation of his right shoulder.   He understands that I do not want him lifting the arm above his head or reaching behind him.  He can come out of the sling for using a walker as he rehabilitates his right hip"       Mobility Bed Mobility Overal bed mobility: Needs Assistance Bed Mobility: Supine to Sit     Supine to sit: HOB elevated;Min assist     General bed mobility comments: min assist for progression of LEs over EOB, verbal cuing for sequencing task and pt able to scoot self to EOB. Verbal cuing for maintaining hip precautions.    Transfers Overall transfer level: Needs assistance Equipment used: Rolling walker (2 wheeled) Transfers: Sit to/from Stand Sit to Stand: Mod assist;+2 physical assistance         General transfer comment: mod +2 for power up, rise, and steady; VC for hand placement when rising/sitting, placing R hand in lap if possible during transitional movements to protect shoulder, and maintaining hip precautions during stand/sit (propping RLE out in front when moving stand>sit to maintain precautions). STS x2, from elevated EOB and from [facility].    Balance Overall balance assessment: Needs assistance;History of Falls Sitting-balance support: Single extremity supported;Feet unsupported Sitting balance-Leahy Scale: Fair Sitting balance - Comments: able to sit EOB without PT assist   Standing balance support: Bilateral upper extremity supported;During functional activity Standing balance-Leahy Scale: Poor Standing balance comment: reliant on external support  ADL either performed or assessed with clinical judgement   ADL Overall ADL's : Needs assistance/impaired     Grooming: Wash/dry hands;Wash/dry face;Sitting;Set up;Supervision/safety           Upper Body Dressing : Minimal assistance;Sitting     Lower Body Dressing Details (indicate cue type and reason): Sup to don prosthesis seated EOB and on [facility] Toilet Transfer: Moderate assistance;+2 for  physical assistance;Ambulation;RW;BSC;Cueing for safety   Toileting- Clothing Manipulation and Hygiene: Sit to/from stand;Maximal assistance               Vision Baseline Vision/History: Wears glasses Patient Visual Report: No change from baseline     Perception     Praxis      Cognition Arousal/Alertness: Awake/alert Behavior During Therapy: WFL for tasks assessed/performed Overall Cognitive Status: Within Functional Limits for tasks assessed                                 General Comments: motivated, follows safely cues well, consistently follows multi-step commands        Exercises     Shoulder Instructions       General Comments redness along tibial surface of R residual limb, with pt complaints of burning sensation    Pertinent Vitals/ Pain       Pain Assessment: 0-10 Pain Score: 7  Pain Location: R hip Pain Descriptors / Indicators: Discomfort;Grimacing;Sore;Operative site guarding Pain Intervention(s): Limited activity within patient's tolerance;Monitored during session;Repositioned  Home Living                                          Prior Functioning/Environment              Frequency  Min 2X/week        Progress Toward Goals  OT Goals(current goals can now be found in the care plan section)  Progress towards OT goals: Progressing toward goals  Acute Rehab OT Goals Patient Stated Goal: return to my independence  Plan Discharge plan remains appropriate    Co-evaluation    PT/OT/SLP Co-Evaluation/Treatment: Yes Reason for Co-Treatment: For patient/therapist safety;To address functional/ADL transfers PT goals addressed during session: Mobility/safety with mobility;Balance;Proper use of DME;Strengthening/ROM OT goals addressed during session: ADL's and self-care;Proper use of Adaptive equipment and DME      AM-PAC OT "6 Clicks" Daily Activity     Outcome Measure   Help from another person eating  meals?: None Help from another person taking care of personal grooming?: A Little Help from another person toileting, which includes using toliet, bedpan, or urinal?: Total Help from another person bathing (including washing, rinsing, drying)?: A Lot Help from another person to put on and taking off regular upper body clothing?: A Little Help from another person to put on and taking off regular lower body clothing?: Total 6 Click Score: 14    End of Session Equipment Utilized During Treatment: Gait belt;Rolling walker;Other (comment) (BSC)  OT Visit Diagnosis: Other abnormalities of gait and mobility (R26.89);Muscle weakness (generalized) (M62.81);History of falling (Z91.81);Pain Pain - Right/Left: Right Pain - part of body: Shoulder;Hip   Activity Tolerance Patient tolerated treatment well   Patient Left with call bell/phone within reach;with family/visitor present;in chair;with chair alarm set   Nurse Communication          Time: 0623-7628 OT Time Calculation (min): 44  min  Charges: OT General Charges $OT Visit: 1 Visit OT Treatments $Self Care/Home Management : 8-22 mins     Emmit Alexanders [facility] 04/01/2020, 3:06 PM

## 2020-04-01 NOTE — Progress Notes (Signed)
PROGRESS NOTE    [name]   DQQ:[id number]  DOB: Mar 05, 1950  PCP: [name], MD    DOA: 03/27/2020 LOS: 5   Brief Narrative   Patient is a 70 year old Caucasian male with past medical history significant for hypertension, hyperlipidemia, diabetes mellitus type 2, OSA off of CPAP, status post gastric bypass in 2012, GERD, status post right BKA secondary to complications of diabetes mellitus, recent right shoulder surgery for full-thickness rotator cuff tear (within the last week) and iron deficiency anemia. Patient was discharged from the hospital on Tuesday after rotator cuff surgery. Patient fell at home on the same day of discharge. Right hip pain gradually got worse. Patient eventually presented to the hospital and was found to have right hip fracture.  He transferred to [facility] and underwent R hip arthroplasty with orthopedic surgery on 3/7.  Assessment & Plan   Active Problems:   Hypothyroidism   Type 2 diabetes mellitus with vascular disease (HCC)   HYPERLIPIDEMIA, MIXED   ANEMIA, IRON DEFICIENCY, HX OF   CAD (coronary artery disease)   Chronic diastolic CHF (congestive heart failure) (HCC)   S/P transmetatarsal amputation of foot, left (HCC)   Hip fracture (HCC)   Closed fracture of neck of right femur (HCC)   Right hip fracture -due to mechanical fall at home.  Postop day 3.   --Continue pain control, bowel regimen.   --Continue Flexeril, more helpful than Robaxin but slightly sedating --Trial of MagOx for muscle cramping --PT and OT recommend SNF, TOC following --Ortho recommended WBAT on RLE with use of prosthesis.  --Lovenox for DVT prophylaxis --Follow-up with Dr. [name] in 2 weeks  Status post right BKA -stable, due to diabetes complications.  Patient currently has more swelling than his baseline in the stump.  Wearing stump shrinker. Continue PT.  Recent right rotator cuff tear status post surgical repair -right upper extremity in sling.  Continue as  needed pain meds.  OT.  Constipation -continue bowel regimen.  As needed suppository or enema.  Hypertension -chronic, stable.  Continue lisinopril  Hyperlipidemia -continue statin  Hypothyroidism -continue Synthroid  GERD -continue Protonix  Type 2 diabetes -well controlled, A1c 6.8%.  Continue sliding scale NovoLog.  Coronary artery disease -stable with no active chest pain.  Continue statin and ACE inhibitor.  Chronic diastolic CHF -currently euvolemic on exam well compensated.  Not on diuretics outpatient.  Continue statin and ACE inhibitor.    Patient BMI: Body mass index is 26.91 kg/m.   DVT prophylaxis: enoxaparin (LOVENOX) injection 40 mg Start: 03/30/20 0800 SCDs Start: 03/29/20 1737 SCDs Start: 03/27/20 2145   Diet:  Diet Orders (From admission, onward)    Start     Ordered   03/29/20 1737  Diet Carb Modified Fluid consistency: Thin; Room service appropriate? Yes  Diet effective now       Question Answer Comment  Calorie Level Medium 1600-2000   Fluid consistency: Thin   Room service appropriate? Yes      03/29/20 1736            Code Status: Full Code    Subjective 04/01/20    Patient seen with wife at bedside, getting ready to work with therapy.  He reports ongoing right lower extremity spasms, stated woke him from sleep early this morning.  Flexeril as somewhat more helpful than Robaxin but he does feel somewhat fatigued with it.  Denies any other acute complaints.   Disposition Plan & Communication   Status is: Inpatient  Remains inpatient appropriate because: requires SNF placement which is pending   Dispo: The patient is from: Home              Anticipated d/c is to: SNF              Patient currently is medically stable to d/c.   Difficult to place patient Yes   Family Communication: None at bedside, will attempt to call wife   Consults, Procedures, Significant Events   Consultants:   Orthopedics  Procedures:   3/7 -right hip  arthroplasty  Antimicrobials:  Anti-infectives (From admission, onward)   Start     Dose/Rate Route Frequency Ordered Stop   03/29/20 1830  ceFAZolin (ANCEF) IVPB 2g/100 mL premix        2 g 200 mL/hr over 30 Minutes Intravenous Every 6 hours 03/29/20 1736 03/30/20 0532   03/29/20 1411  vancomycin (VANCOCIN) powder  Status:  Discontinued          As needed 03/29/20 1412 03/29/20 1557   03/29/20 1115  ceFAZolin (ANCEF) IVPB 2g/100 mL premix        2 g 200 mL/hr over 30 Minutes Intravenous On call to O.R. 03/28/20 1633 03/29/20 1321        Micro    Objective   Vitals:   03/31/20 0743 03/31/20 2042 04/01/20 0400 04/01/20 0821  BP: 119/65 121/72 112/66 123/71  Pulse: 90 75 60 67  Resp: 18  16 16   Temp: 98.7 F (37.1 C) 98.9 F (37.2 C) 98.7 F (37.1 C) 97.9 F (36.6 C)  TempSrc: Oral Oral Oral Oral  SpO2: 94% 96% 95% 96%  Weight:      Height:        Intake/Output Summary (Last 24 hours) at 04/01/2020 1504 Last data filed at 04/01/2020 0537 Gross per 24 hour  Intake --  Output 650 ml  Net -650 ml   Filed Weights   03/27/20 2100 03/29/20 1150  Weight: 111.1 kg 111.1 kg    Physical Exam:  General exam: awake, alert, no acute distress Respiratory system: CTAB, normal respiratory effort, on room air. Cardiovascular system: normal S1/S2, RRR Central nervous system: A&O x4. no gross focal neurologic deficits, normal speech Extremities: R BKA with stump shrinker in place, normal tone, improved edema of stump Psychiatric: normal mood, congruent affect, judgement and insight appear normal  Labs   Data Reviewed: I have personally reviewed following labs and imaging studies  CBC: Recent Labs  Lab 03/27/20 1716 03/28/20 0256 03/29/20 1756 03/30/20 0916 03/31/20 0523 04/01/20 0331  WBC 9.1 7.7 13.2* 10.3 9.0 8.6  NEUTROABS 7.3  --   --   --   --   --   HGB 14.2 13.9 15.0 13.0 12.5* 12.3*  HCT 41.8 41.2 43.1 38.6* 36.0* 36.0*  MCV 92.3 92.6 91.7 92.6 90.5 91.8   PLT 186 194 237 202 181 983   Basic Metabolic Panel: Recent Labs  Lab 03/27/20 1716 03/28/20 0256 03/29/20 1756 03/30/20 0916 03/31/20 0523  NA 137 139  --  134* 133*  K 3.7 3.5  --  3.8 3.8  CL 103 105  --  101 103  CO2 24 24  --  23 20*  GLUCOSE 145* 107*  --  186* 174*  BUN 17 17  --  16 14  CREATININE 1.05 0.79 0.99 0.94 0.82  CALCIUM 9.1 8.8*  --  8.4* 8.4*   GFR: Estimated Creatinine Clearance: 112.2 mL/min (by C-G formula based on SCr  of 0.82 mg/dL). Liver Function Tests: Recent Labs  Lab 03/27/20 1716 03/28/20 0256  AST 21 18  ALT 18 17  ALKPHOS 49 46  BILITOT 1.4* 1.2  PROT 6.7 6.5  ALBUMIN 3.7 3.5  3.5   No results for input(s): LIPASE, AMYLASE in the last 168 hours. No results for input(s): AMMONIA in the last 168 hours. Coagulation Profile: Recent Labs  Lab 03/27/20 1716  INR 1.1   Cardiac Enzymes: No results for input(s): CKTOTAL, CKMB, CKMBINDEX, TROPONINI in the last 168 hours. BNP (last 3 results) No results for input(s): PROBNP in the last 8760 hours. HbA1C: No results for input(s): HGBA1C in the last 72 hours. CBG: Recent Labs  Lab 03/31/20 1243 03/31/20 1802 03/31/20 2043 04/01/20 0632 04/01/20 1122  GLUCAP 167* 162* 189* 169* 160*   Lipid Profile: No results for input(s): CHOL, HDL, LDLCALC, TRIG, CHOLHDL, LDLDIRECT in the last 72 hours. Thyroid Function Tests: No results for input(s): TSH, T4TOTAL, FREET4, T3FREE, THYROIDAB in the last 72 hours. Anemia Panel: No results for input(s): VITAMINB12, FOLATE, FERRITIN, TIBC, IRON, RETICCTPCT in the last 72 hours. Sepsis Labs: No results for input(s): PROCALCITON, LATICACIDVEN in the last 168 hours.  Recent Results (from the past 240 hour(s))  Resp Panel by RT-PCR (Flu A&B, Covid) Nasopharyngeal Swab     Status: None   Collection Time: 03/27/20  5:16 PM   Specimen: Nasopharyngeal Swab; Nasopharyngeal(NP) swabs in vial transport medium  Result Value Ref Range Status   SARS  Coronavirus 2 by RT PCR NEGATIVE NEGATIVE Final    Comment: (NOTE) SARS-CoV-2 target nucleic acids are NOT DETECTED.  The SARS-CoV-2 RNA is generally detectable in upper respiratory specimens during the acute phase of infection. The lowest concentration of SARS-CoV-2 viral copies this assay can detect is 138 copies/mL. A negative result does not preclude SARS-Cov-2 infection and should not be used as the sole basis for treatment or other patient management decisions. A negative result may occur with  improper specimen collection/handling, submission of specimen other than nasopharyngeal swab, presence of viral mutation(s) within the areas targeted by this assay, and inadequate number of viral copies(<138 copies/mL). A negative result must be combined with clinical observations, patient history, and epidemiological information. The expected result is Negative.  Fact Sheet for Patients:  EntrepreneurPulse.com.au  Fact Sheet for Healthcare Providers:  IncredibleEmployment.be  This test is no t yet approved or cleared by the [place] FDA and  has been authorized for detection and/or diagnosis of SARS-CoV-2 by FDA under an Emergency Use Authorization (EUA). This EUA will remain  in effect (meaning this test can be used) for the duration of the COVID-19 declaration under Section 564(b)(1) of the Act, 21 U.S.C.section 360bbb-3(b)(1), unless the authorization is terminated  or revoked sooner.       Influenza A by PCR NEGATIVE NEGATIVE Final   Influenza B by PCR NEGATIVE NEGATIVE Final    Comment: (NOTE) The Xpert Xpress SARS-CoV-2/FLU/RSV plus assay is intended as an aid in the diagnosis of influenza from Nasopharyngeal swab specimens and should not be used as a sole basis for treatment. Nasal washings and aspirates are unacceptable for Xpert Xpress SARS-CoV-2/FLU/RSV testing.  Fact Sheet for  Patients: EntrepreneurPulse.com.au  Fact Sheet for Healthcare Providers: IncredibleEmployment.be  This test is not yet approved or cleared by the [place] FDA and has been authorized for detection and/or diagnosis of SARS-CoV-2 by FDA under an Emergency Use Authorization (EUA). This EUA will remain in effect (meaning this test can be used)  for the duration of the COVID-19 declaration under Section 564(b)(1) of the Act, 21 U.S.C. section 360bbb-3(b)(1), unless the authorization is terminated or revoked.  Performed at [facility], [place] [address] [place], [place] [address]   Surgical pcr screen     Status: None   Collection Time: 03/28/20  2:41 PM   Specimen: Nasal Mucosa; Nasal Swab  Result Value Ref Range Status   MRSA, PCR NEGATIVE NEGATIVE Final   Staphylococcus aureus NEGATIVE NEGATIVE Final    Comment: (NOTE) The Xpert SA Assay (FDA approved for NASAL specimens in patients 32 years of age and older), is one component of a comprehensive surveillance program. It is not intended to diagnose infection nor to guide or monitor treatment. Performed at [facility], [place] [address] [place],  [address]       Imaging Studies   No results found.   Medications   Scheduled Meds: . atorvastatin  10 mg Oral QHS  . calcium carbonate  500 mg of elemental calcium Oral TID WC  . docusate sodium  100 mg Oral BID  . enoxaparin (LOVENOX) injection  40 mg Subcutaneous Q24H  . feeding supplement  237 mL Oral TID BM  . ferrous sulfate  325 mg Oral Daily  . insulin aspart  0-9 Units Subcutaneous TID WC & HS  . levothyroxine  150 mcg Oral Q0600  . lisinopril  2.5 mg Oral Daily  . multivitamin with minerals  1 tablet Oral Daily  . pantoprazole  40 mg Oral Daily  . polyethylene glycol  17 g Oral Daily   Continuous Infusions: . sodium chloride 75 mL/hr at 03/29/20 1806       LOS: 5 days    Time spent: 25  minutes with >50% spent at bedside and in coordination of care.    Ezekiel Slocumb, DO [facility]  04/01/2020, 3:04 PM      If 7PM-7AM, please contact night-coverage. How to contact the [facility] Attending or Consulting provider [place] or covering provider during after hours [place], for this patient?    1. Check the care team in [facility] and look for a) attending/consulting TRH provider listed and b) the [facility] team listed 2. Log into [email or web address] and use [place]'s universal password to access. If you do not have the password, please contact the hospital operator. 3. Locate the [facility] provider you are looking for under [facility] and page to a number that you can be directly reached. 4. If you still have difficulty reaching the provider, please page the [facility] (Director on Call) for the Hospitalists listed on amion for assistance.

## 2020-04-01 NOTE — Progress Notes (Signed)
Patient ID: [name], male   DOB: 1950/10/23, 70 y.o.   MRN: [id number] I did speak to Mr. Hohn at the bedside about his right shoulder.  I was able to perform a rotator cuff repair but did not have to place anchors in the bone.  He is comfortable in the sling.  I told him he can come in and out of the sling to work on rotation of his right shoulder.  He understands that I do not want him lifting the arm above his head or reaching behind him.  He can come out of the sling for using a walker as he rehabilitates his right hip.  He states that he understands my instructions.

## 2020-04-01 NOTE — NC FL2 (Signed)
[place] LEVEL OF CARE SCREENING TOOL     IDENTIFICATION  Patient Name: [name] Birthdate: 04/30/1950 Sex: male Admission Date (Current Location): 03/27/2020  [facility] and [place] Number:  Herbalist and Address:  The [place], [place] [address], [place], [place] [address]      Provider Number: [id number]  Attending Physician Name and Address:  Ezekiel Slocumb, DO  Relative Name and Phone Number:       Current Level of Care: Hospital Recommended Level of Care: [place] Prior Approval Number:    Date Approved/Denied:   PASRR Number: [id number]  Discharge Plan: SNF    Current Diagnoses: Patient Active Problem List   Diagnosis Date Noted  . Closed fracture of neck of right femur ([place])   . Hip fracture ([place]) 03/27/2020  . Nontraumatic complete tear of right rotator cuff 02/26/2020  . S/P transmetatarsal amputation of foot, left ([place]) 04/25/2016  . Chronic osteomyelitis of toe, left ([place])   . Cellulitis of toe of left foot   . Chest pain 09/25/2015  . CAD (coronary artery disease) of artery bypass graft 09/25/2015  . Sinus bradycardia on ECG 09/25/2015  . Chronic diastolic CHF (congestive heart failure) ([place]) 09/25/2015  . Chest pain at rest 09/25/2015  . Aortic valvular disorder 08/04/2014  . Disorder of mitral valve 08/04/2014  . Diabetic foot infection ([place]) 06/03/2014  . Peripheral vascular disease ([place]) 06/03/2014  . Abscess of foot 06/03/2014  . CAD (coronary artery disease) 07/29/2013  . Trifascicular block 07/29/2013  . 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 Hypothyroidism 01/22/2007  . Type 2 diabetes mellitus with vascular disease ([place]) 01/22/2007  . HYPERLIPIDEMIA, MIXED 01/22/2007  . MYOCARDIAL INFARCTION, HX OF 01/22/2007  . DEGENERATIVE JOINT DISEASE 01/22/2007  . ANEMIA, IRON DEFICIENCY, HX OF 01/22/2007    Orientation RESPIRATION BLADDER Height & Weight      Self,Time,Situation,Place  Normal Continent Weight: 245 lb (111.1 kg) Height:  6\' 8"  (203.2 cm)  BEHAVIORAL SYMPTOMS/MOOD NEUROLOGICAL BOWEL NUTRITION STATUS      Continent Diet  AMBULATORY STATUS COMMUNICATION OF NEEDS Skin   Extensive Assist Verbally Surgical wounds                       Personal Care Assistance Level of Assistance  Bathing,Dressing Bathing Assistance: Limited assistance   Dressing Assistance: Limited assistance     Functional Limitations Info  Sight,Hearing,Speech Sight Info: Impaired Hearing Info: Adequate Speech Info: Adequate    SPECIAL CARE FACTORS FREQUENCY  PT (By licensed PT),OT (By licensed OT)                    Contractures Contractures Info: Not present    Additional Factors Info  Code Status Code Status Info: FULL CODE             Current Medications (04/01/2020):  This is the current hospital active medication list Current Facility-Administered Medications  Medication Dose Route Frequency Provider Last Rate Last Admin  . 0.9 %  sodium chloride infusion   Intravenous Continuous [name], MD 75 mL/hr at 03/29/20 1806 New Bag at 03/29/20 1806  . acetaminophen (TYLENOL) tablet 325-650 mg  325-650 mg Oral Q6H PRN Pahwani, Rinka R, MD      . alum & mag hydroxide-simeth (MAALOX/MYLANTA) 200-200-20 MG/5ML suspension 30 mL  30 mL Oral Q4H PRN [name], MD      .  atorvastatin (LIPITOR) tablet 10 mg  10 mg Oral QHS [name], MD   10 mg at 03/31/20 2131  . calcium carbonate (OS-CAL - dosed in mg of elemental calcium) tablet 500 mg of elemental calcium  500 mg of elemental calcium Oral TID WC Pahwani, Rinka R, MD   500 mg of elemental calcium at 04/01/20 0801  . cyclobenzaprine (FLEXERIL) tablet 5 mg  5 mg Oral TID PRN Nicole Kindred A, DO   5 mg at 04/01/20 0401  . docusate sodium (COLACE) capsule 100 mg  100 mg Oral BID [name], MD   100 mg at 03/29/20 2118  . enoxaparin (LOVENOX) injection 40 mg  40 mg  Subcutaneous Q24H [name], MD   40 mg at 04/01/20 0801  . ferrous sulfate tablet 325 mg  325 mg Oral Daily Pahwani, Rinka R, MD   325 mg at 03/31/20 1036  . HYDROmorphone (DILAUDID) injection 0.5-1 mg  0.5-1 mg Intravenous Q4H PRN [name], MD      . insulin aspart (novoLOG) injection 0-9 Units  0-9 Units Subcutaneous TID WC & HS Pahwani, Rinka R, MD   2 Units at 04/01/20 0802  . levothyroxine (SYNTHROID) tablet 150 mcg  150 mcg Oral Q0600 [name], MD   150 mcg at 04/01/20 0536  . lisinopril (ZESTRIL) tablet 2.5 mg  2.5 mg Oral Daily [name], MD   2.5 mg at 03/31/20 1035  . magnesium citrate solution 1 Bottle  1 Bottle Oral Once PRN [name], MD      . menthol-cetylpyridinium (CEPACOL) lozenge 3 mg  1 lozenge Oral PRN [name], MD       Or  . phenol (CHLORASEPTIC) mouth spray 1 spray  1 spray Mouth/Throat PRN [name], MD      . multivitamin with minerals tablet 1 tablet  1 tablet Oral Daily [name], MD   1 tablet at 03/31/20 1034  . ondansetron (ZOFRAN) tablet 4 mg  4 mg Oral Q6H PRN [name], MD       Or  . ondansetron [facility]) injection 4 mg  4 mg Intravenous Q6H PRN [name], MD      . oxyCODONE (Oxy IR/ROXICODONE) immediate release tablet 10-15 mg  10-15 mg Oral Q4H PRN [name], MD   10 mg at 04/01/20 0536  . oxyCODONE (Oxy IR/ROXICODONE) immediate release tablet 5-10 mg  5-10 mg Oral Q4H PRN [name], MD   10 mg at 03/31/20 2131  . pantoprazole (PROTONIX) EC tablet 40 mg  40 mg Oral Daily [name], MD   40 mg at 03/31/20 1035  . polyethylene glycol (MIRALAX / GLYCOLAX) packet 17 g  17 g Oral Daily [name], MD   17 g at 03/31/20 1035  . polyethylene glycol (MIRALAX / GLYCOLAX) packet 17 g  17 g Oral Daily PRN [name], MD      . sorbitol 70 % solution 30 mL  30 mL Oral Daily PRN [name], MD         Discharge Medications: Please see discharge summary for a list of discharge medications.  Relevant  Imaging Results:  Relevant Lab Results:   Additional Information SS# [id number]  Amador Cunas, [place]

## 2020-04-02 DIAGNOSIS — E782 Mixed hyperlipidemia: Secondary | ICD-10-CM

## 2020-04-02 DIAGNOSIS — I5032 Chronic diastolic (congestive) heart failure: Secondary | ICD-10-CM | POA: Diagnosis not present

## 2020-04-02 DIAGNOSIS — S72001D Fracture of unspecified part of neck of right femur, subsequent encounter for closed fracture with routine healing: Secondary | ICD-10-CM | POA: Diagnosis not present

## 2020-04-02 DIAGNOSIS — S43421D Sprain of right rotator cuff capsule, subsequent encounter: Secondary | ICD-10-CM

## 2020-04-02 DIAGNOSIS — I2510 Atherosclerotic heart disease of native coronary artery without angina pectoris: Secondary | ICD-10-CM

## 2020-04-02 DIAGNOSIS — E1159 Type 2 diabetes mellitus with other circulatory complications: Secondary | ICD-10-CM

## 2020-04-02 DIAGNOSIS — Z89432 Acquired absence of left foot: Secondary | ICD-10-CM

## 2020-04-02 DIAGNOSIS — E039 Hypothyroidism, unspecified: Secondary | ICD-10-CM | POA: Diagnosis not present

## 2020-04-02 LAB — GLUCOSE, CAPILLARY
Glucose-Capillary: 176 mg/dL — ABNORMAL HIGH (ref 70–99)
Glucose-Capillary: 159 mg/dL — ABNORMAL HIGH (ref 70–99)
Glucose-Capillary: 161 mg/dL — ABNORMAL HIGH (ref 70–99)
Glucose-Capillary: 177 mg/dL — ABNORMAL HIGH (ref 70–99)

## 2020-04-02 LAB — BASIC METABOLIC PANEL
Anion gap: 8 (ref 5–15)
BUN: 15 mg/dL (ref 8–23)
CO2: 23 mmol/L (ref 22–32)
Calcium: 8.4 mg/dL — ABNORMAL LOW (ref 8.9–10.3)
Chloride: 102 mmol/L (ref 98–111)
Creatinine, Ser: 0.84 mg/dL (ref 0.61–1.24)
GFR, Estimated: >60 mL/min (ref >60)
Glucose, Bld: 184 mg/dL — ABNORMAL HIGH (ref 70–99)
Potassium: 3.7 mmol/L (ref 3.5–5.1)
Sodium: 133 mmol/L — ABNORMAL LOW (ref 135–145)

## 2020-04-02 LAB — MAGNESIUM: Magnesium: 2 mg/dL (ref 1.7–2.4)

## 2020-04-02 LAB — RESP PANEL BY RT-PCR (FLU A&B, COVID) ARPGX2
Influenza A by PCR: NEGATIVE
Influenza B by PCR: NEGATIVE
SARS Coronavirus 2 by RT PCR: NEGATIVE

## 2020-04-02 MED ORDER — ADULT MULTIVITAMIN W/MINERALS CH: 1.0000 | ORAL_TABLET | Freq: Two times a day (BID) | ORAL | Status: AC

## 2020-04-02 MED ORDER — POLYETHYLENE GLYCOL 3350 17 GM/SCOOP PO POWD: 17.0000 g | Freq: Two times a day (BID) | ORAL | 0 refills | Status: DC | PRN

## 2020-04-02 MED ORDER — ENSURE ENLIVE PO LIQD: 237.0000 mL | Freq: Three times a day (TID) | ORAL | 12 refills | Status: DC

## 2020-04-02 MED ORDER — SENNOSIDES-DOCUSATE SODIUM 8.6-50 MG PO TABS: 1.0000 | ORAL_TABLET | Freq: Two times a day (BID) | ORAL | 0 refills | Status: DC | PRN

## 2020-04-02 NOTE — Progress Notes (Signed)
Report called to [facility]. All questions answered. Script and AVS on chart for PTAR. Pt belongings gathered to be sent with him. Pt waiting on PTAR for transport.

## 2020-04-02 NOTE — Social Work (Signed)
SNF offers provided to pt and he has accepted bed at [facility]. Pt will need COVID test prior to dc. MD messaged with update.   Wandra Feinstein, MSW, LCSW [phone] (coverage)

## 2020-04-02 NOTE — TOC CAGE-AID Note (Signed)
Transition of Care [facility]) - CAGE-AID Screening   Patient Details  Name: [name] MRN: [id number] Date of Birth: May 28, 1950  [facility]) CM/SW Contact:    Coralee Pesa, [place] Phone Number: 04/02/2020, 2:15 PM   Clinical Narrative: CSW completed CAGE-AID assessment with pt. He denies any substance use and declines resources at this time.   CAGE-AID Screening:    Have You Ever Felt You Ought to Cut Down on Your Drinking or Drug Use?: No Have People Annoyed You By Critizing Your Drinking Or Drug Use?: No Have You Felt Bad Or Guilty About Your Drinking Or Drug Use?: No Have You Ever Had a Drink or Used Drugs First Thing In The Morning to Steady Your Nerves or to Get Rid of a Hangover?: No CAGE-AID Score: 0  Substance Abuse Education Offered: No

## 2020-04-02 NOTE — Plan of Care (Signed)
  Problem: Health Behavior/Discharge Planning: Goal: Ability to manage health-related needs will improve Outcome: Progressing   Problem: Activity: Goal: Risk for activity intolerance will decrease Outcome: Progressing   Problem: Pain Managment: Goal: General experience of comfort will improve Outcome: Progressing   

## 2020-04-02 NOTE — TOC Transition Note (Signed)
Transition of Care [facility]) - CM/SW Discharge Note   Patient Details  Name: [name] MRN: [id number] Date of Birth: [birth year]  [facility]) CM/SW Contact:  Amador Cunas, [place] Phone Number: 04/02/2020, 4:09 PM   Clinical Narrative:   Pt for dc to [facility] today. Pt/wife aware of dc and report agreeable. Spoke to [place] with [facility] who confirmed they are prepared to admit pt to room 305. PTAR arranged for transport and RN provided with number for report. SW signing off at dc.   Wandra Feinstein, MSW, LCSW [phone] (coverage)       Final next level of care: Skilled Nursing Facility Barriers to Discharge: No Barriers Identified   Patient Goals and CMS Choice   CMS Medicare.gov Compare Post Acute Care list provided to:: Patient Choice offered to / list presented to : Patient  Discharge Placement PASRR number recieved: 04/02/20            Patient [facility] bed at: [facility] Patient to be transferred to facility by: [place] Name of family member notified: Opal Sidles Patient and family notified of of transfer: 04/02/20  Discharge Plan and Services                                     Social Determinants of Health (SDOH) Interventions     Readmission Risk Interventions No flowsheet data found.

## 2020-04-02 NOTE — Discharge Summary (Signed)
Physician Discharge Summary  [name] HMC:[id number] DOB: 1950-10-15 DOA: 03/27/2020  PCP: [name], MD  Admit date: 03/27/2020 Discharge date: 04/02/2020  Admitted From: Home Disposition: SNF  Recommendations for Outpatient Follow-up:  1. Follow ups as below. 2. Please obtain CBC/BMP/Mag at follow up 3. Please follow up on the following pending results: None  Discharge Condition: Stable CODE STATUS: Full code   Contact information for follow-up providers    Lucas Koyanagi, MD In 2 weeks.   Specialty: Orthopedic Surgery Why: For suture removal, For wound re-check Contact information: [name] [id number] [phone]        Lucas Rossetti, MD. Schedule an appointment as soon as possible for a visit in 2 week(s).   Specialty: Orthopedic Surgery Contact information: [place] [address] [phone]            Contact information for after-discharge care    Destination    [facility] .   Service: Skilled Nursing Contact information: [name] [phone]                   Hospital Course: 70 year old male with PMH of NIDDM-2 complicated by right BKA, CAD, diastolic CHF, OSA not on CPAP and recent hospitalization for right rotator cuff repair returning with right hip pain after fall at home the day of discharge, and admitted for right hip fracture.  He underwent right hip arthroplasty on 03/29/2020 by [name].  Therapy recommended SNF.  See individual problem list below for more hospital course.  Discharge Diagnoses:  Right hip fracture -due to mechanical fall at home. S/p total hip replacement by [name] on 03/29/2020 -Percocet for pain with bowel regimen per surgery. -Ortho recommended WBAT on RLE with use of prosthesis.  -Lovenox for DVT prophylaxis -Follow-up with [name] in 2 weeks  Status post right BKA -stable, due to diabetes  complications.  Patient currently has more swelling than his baseline in the stump.  Wearing stump shrinker. -Continue stump shrinker  Recent right rotator cuff tear status post surgical repair -right upper extremity in sling.  Per [name], he can come in and out of the sling to work on rotation of his right shoulder and use walker but no lifting of the arm above his head or reaching behind.  -Follow-up with [name] in 2 weeks  Constipation -continue bowel regimen.  Hypertension -chronic, stable.  Continue lisinopril  Hyperlipidemia -continue statin  Hypothyroidism -continue Synthroid  GERD -continue Protonix  Type 2 diabetes -well controlled, A1c 6.8%.    Continue Metformin  Coronary artery disease -stable with no active chest pain.  Continue statin and ACE inhibitor.  Chronic diastolic CHF -currently euvolemic on exam well compensated.  Not on diuretics outpatient.  -Continue statin and ACE inhibitor.   Body mass index is 26.91 kg/m. Nutrition Problem: Inadequate oral intake Etiology: inability to eat Signs/Symptoms: NPO status Interventions: Ensure Enlive (each supplement provides 350kcal and 20 grams of protein),Magic cup,MVI      Discharge Exam: Vitals:   04/02/20 0337 04/02/20 0852  BP: 103/78 106/72  Pulse: (!) 58 63  Resp:  16  Temp: 97.8 F (36.6 C) 97.7 F (36.5 C)  SpO2:  95%    GENERAL: No apparent distress.  Nontoxic. HEENT: MMM.  Vision and hearing grossly intact.  NECK: Supple.  No apparent JVD.  RESP: On RA.  No IWOB.  Fair aeration bilaterally. CVS:  RRR. Heart sounds normal.  ABD/GI/GU: Bowel sounds present. Soft. Non tender.  MSK/EXT:  Moves extremities. No apparent deformity.  Right BKA and shrinker.  RUE in a sling SKIN: Dressing over right hip DCI NEURO: Awake, alert and oriented appropriately.  No apparent focal neuro deficit. PSYCH: Calm. Normal affect.  Discharge Instructions  Discharge Instructions    Diet - low  sodium heart healthy   Complete by: As directed    Diet Carb Modified   Complete by: As directed    Increase activity slowly   Complete by: As directed    No wound care   Complete by: As directed    Weight bearing as tolerated   Complete by: As directed      Allergies as of 04/02/2020      Reactions   Bactrim [sulfamethoxazole-trimethoprim] Itching, Rash   Moxifloxacin Rash   Avelox Rash at injection site   Penicillins Rash   Has patient had a PCN reaction causing immediate rash, facial/tongue/throat swelling, SOB or lightheadedness with hypotension: #  #  #  YES  #  #  #  Has patient had a PCN reaction causing severe rash involving mucus membranes or skin necrosis: No Has patient had a PCN reaction that required hospitalization unknown Has patient had a PCN reaction occurring within the last 10 years: No If all of the above answers are "NO", then may proceed with Cephalosporin use.   Quinolones Itching      Medication List    STOP taking these medications   B-12 1000 MCG Subl   CALCIUM CITRATE PO   FISH OIL TRIPLE STRENGTH PO   ibuprofen 200 MG tablet Commonly known as: ADVIL   nabumetone 750 MG tablet Commonly known as: RELAFEN   OVER THE COUNTER MEDICATION   OVER THE COUNTER MEDICATION   oxyCODONE 5 MG immediate release tablet Commonly known as: Roxicodone   PreserVision AREDS 2 Caps   PROBIOTIC PO   sildenafil 20 MG tablet Commonly known as: REVATIO   tiZANidine 4 MG tablet Commonly known as: Zanaflex     TAKE these medications   aspirin EC 81 MG tablet Take 81 mg by mouth daily.   atorvastatin 10 MG tablet Commonly known as: LIPITOR Take 10 mg by mouth at bedtime.   enoxaparin 40 MG/0.4ML injection Commonly known as: LOVENOX Inject 0.4 mLs (40 mg total) into the skin daily for 14 days.   feeding supplement Liqd Take 237 mLs by mouth 3 (three) times daily between meals.   ferrous sulfate 325 (65 FE) MG tablet Take 325 mg by mouth daily.    fluticasone 50 MCG/ACT nasal spray Commonly known as: FLONASE Place 2 sprays into both nostrils daily as needed for allergies.   GENTEAL SEVERE OP Place 1 drop into the right eye daily as needed (Dry eyes).   levothyroxine 150 MCG tablet Commonly known as: SYNTHROID Take 150 mcg by mouth daily before breakfast.   lisinopril 2.5 MG tablet Commonly known as: ZESTRIL Take 1 tablet (2.5 mg total) by mouth daily.   Melatonin 10 MG Tabs Take 10 mg by mouth at bedtime.   metFORMIN 500 MG 24 hr tablet Commonly known as: GLUCOPHAGE-XR Take 500 mg by mouth 2 (two) times daily.   multivitamin with minerals Tabs tablet Take 1 tablet by mouth in the morning and at bedtime. Gummies What changed: how much to take   nitroGLYCERIN 0.4 MG SL tablet Commonly known as: NITROSTAT PLACE 1 TABLET (0.4 MG TOTAL) UNDER THE  TONGUE EVERY 5 (FIVE) MINUTES X 3 DOSES AS NEEDED FOR CHEST PAIN.   Omeprazole 20 MG Tbec Take 20 mg by mouth daily as needed (acid reflux/ indigestion).   oxyCODONE-acetaminophen 5-325 MG tablet Commonly known as: Percocet Take 1-2 tablets by mouth every 8 (eight) hours as needed for severe pain.   polyethylene glycol powder 17 GM/SCOOP powder Commonly known as: MiraLax Take 17 g by mouth 2 (two) times daily as needed for moderate constipation.   senna-docusate 8.6-50 MG tablet Commonly known as: Senokot-S Take 1 tablet by mouth 2 (two) times daily between meals as needed for mild constipation.            Discharge Care Instructions  (From admission, onward)         Start     Ordered   03/28/20 0000  Weight bearing as tolerated        03/28/20 1623          Consultations:  Orthopedic surgery  Procedures/Studies:  03/29/2020-total hip replacement of right hip   Pelvis Portable  Result Date: 03/29/2020 CLINICAL DATA:  Status post right hip replacement EXAM: PORTABLE PELVIS 1-2 VIEWS COMPARISON:  03/27/2020 FINDINGS: Right hip replacement is now seen  in satisfactory position. No acute bony abnormality or soft tissue abnormality is noted. IMPRESSION: Right hip replacement in satisfactory position. Electronically Signed   By: Inez Catalina M.D.   On: 03/29/2020 17:09   DG CHEST PORT 1 VIEW  Result Date: 03/27/2020 CLINICAL DATA:  Preop cardiovascular exam.  Known left hip fracture. EXAM: PORTABLE CHEST 1 VIEW COMPARISON:  Chest radiograph 07/30/2016 FINDINGS: Upper normal heart size.The cardiomediastinal contours are normal. The lungs are clear. Pulmonary vasculature is normal. No consolidation, pleural effusion, or pneumothorax. No acute osseous abnormalities are seen. IMPRESSION: No acute chest findings. Electronically Signed   By: Keith Rake M.D.   On: 03/27/2020 21:17   DG Hip Unilat With Pelvis 2-3 Views Right  Result Date: 03/27/2020 CLINICAL DATA:  RIGHT hip pain following fall several days ago. Initial encounter. EXAM: DG HIP (WITH OR WITHOUT PELVIS) 2-3V RIGHT COMPARISON:  None. FINDINGS: A RIGHT femoral neck fracture is identified with varus angulation. No dislocation. No other significant bony abnormalities identified. IMPRESSION: RIGHT femoral neck fracture with varus angulation. Electronically Signed   By: Margarette Canada M.D.   On: 03/27/2020 17:12        The results of significant diagnostics from this hospitalization (including imaging, microbiology, ancillary and laboratory) are listed below for reference.     Microbiology: Recent Results (from the past 240 hour(s))  Resp Panel by RT-PCR (Flu A&B, Covid) Nasopharyngeal Swab     Status: None   Collection Time: 03/27/20  5:16 PM   Specimen: Nasopharyngeal Swab; Nasopharyngeal(NP) swabs in vial transport medium  Result Value Ref Range Status   SARS Coronavirus 2 by RT PCR NEGATIVE NEGATIVE Final    Comment: (NOTE) SARS-CoV-2 target nucleic acids are NOT DETECTED.  The SARS-CoV-2 RNA is generally detectable in upper respiratory specimens during the acute phase of infection.  The lowest concentration of SARS-CoV-2 viral copies this assay can detect is 138 copies/mL. A negative result does not preclude SARS-Cov-2 infection and should not be used as the sole basis for treatment or other patient management decisions. A negative result may occur with  improper specimen collection/handling, submission of specimen other than nasopharyngeal swab, presence of viral mutation(s) within the areas targeted by this assay, and inadequate number of viral copies(<138 copies/mL). A negative result  must be combined with clinical observations, patient history, and epidemiological information. The expected result is Negative.  Fact Sheet for Patients:  EntrepreneurPulse.com.au  Fact Sheet for Healthcare Providers:  IncredibleEmployment.be  This test is no t yet approved or cleared by the [place] FDA and  has been authorized for detection and/or diagnosis of SARS-CoV-2 by FDA under an Emergency Use Authorization (EUA). This EUA will remain  in effect (meaning this test can be used) for the duration of the COVID-19 declaration under Section 564(b)(1) of the Act, 21 U.S.C.section 360bbb-3(b)(1), unless the authorization is terminated  or revoked sooner.       Influenza A by PCR NEGATIVE NEGATIVE Final   Influenza B by PCR NEGATIVE NEGATIVE Final    Comment: (NOTE) The Xpert Xpress SARS-CoV-2/FLU/RSV plus assay is intended as an aid in the diagnosis of influenza from Nasopharyngeal swab specimens and should not be used as a sole basis for treatment. Nasal washings and aspirates are unacceptable for Xpert Xpress SARS-CoV-2/FLU/RSV testing.  Fact Sheet for Patients: EntrepreneurPulse.com.au  Fact Sheet for Healthcare Providers: IncredibleEmployment.be  This test is not yet approved or cleared by the [place] FDA and has been authorized for detection and/or diagnosis of SARS-CoV-2 by FDA  under an Emergency Use Authorization (EUA). This EUA will remain in effect (meaning this test can be used) for the duration of the COVID-19 declaration under Section 564(b)(1) of the Act, 21 U.S.C. section 360bbb-3(b)(1), unless the authorization is terminated or revoked.  Performed at [facility], [place] [address], [place], [place] [address]   Surgical pcr screen     Status: None   Collection Time: 03/28/20  2:41 PM   Specimen: Nasal Mucosa; Nasal Swab  Result Value Ref Range Status   MRSA, PCR NEGATIVE NEGATIVE Final   Staphylococcus aureus NEGATIVE NEGATIVE Final    Comment: (NOTE) The Xpert SA Assay (FDA approved for NASAL specimens in patients 36 years of age and older), is one component of a comprehensive surveillance program. It is not intended to diagnose infection nor to guide or monitor treatment. Performed at [facility], [place] [address], [place], [place] [address]      Labs:  CBC: Recent Labs  Lab 03/27/20 1716 03/28/20 0256 03/29/20 1756 03/30/20 0916 03/31/20 0523 04/01/20 0331  WBC 9.1 7.7 13.2* 10.3 9.0 8.6  NEUTROABS 7.3  --   --   --   --   --   HGB 14.2 13.9 15.0 13.0 12.5* 12.3*  HCT 41.8 41.2 43.1 38.6* 36.0* 36.0*  MCV 92.3 92.6 91.7 92.6 90.5 91.8  PLT 186 194 237 202 181 205   BMP &GFR Recent Labs  Lab 03/27/20 1716 03/28/20 0256 03/29/20 1756 03/30/20 0916 03/31/20 0523 04/02/20 0321  NA 137 139  --  134* 133* 133*  K 3.7 3.5  --  3.8 3.8 3.7  CL 103 105  --  101 103 102  CO2 24 24  --  23 20* 23  GLUCOSE 145* 107*  --  186* 174* 184*  BUN 17 17  --  16 14 15   CREATININE 1.05 0.79 0.99 0.94 0.82 0.84  CALCIUM 9.1 8.8*  --  8.4* 8.4* 8.4*  MG  --   --   --   --   --  2.0   Estimated Creatinine Clearance: 109.5 mL/min (by C-G formula based on SCr of 0.84 mg/dL). Liver & Pancreas: Recent Labs  Lab 03/27/20 1716 03/28/20 0256  AST 21 18  ALT 18 17  ALKPHOS 49 46  BILITOT 1.4* 1.2  PROT 6.7 6.5   ALBUMIN 3.7 3.5  3.5   No results for input(s): LIPASE, AMYLASE in the last 168 hours. No results for input(s): AMMONIA in the last 168 hours. Diabetic: No results for input(s): HGBA1C in the last 72 hours. Recent Labs  Lab 04/01/20 1122 04/01/20 1654 04/01/20 2112 04/02/20 0633 04/02/20 1128  GLUCAP 160* 171* 244* 176* 159*   Cardiac Enzymes: No results for input(s): CKTOTAL, CKMB, CKMBINDEX, TROPONINI in the last 168 hours. No results for input(s): PROBNP in the last 8760 hours. Coagulation Profile: Recent Labs  Lab 03/27/20 1716  INR 1.1   Thyroid Function Tests: No results for input(s): TSH, T4TOTAL, FREET4, T3FREE, THYROIDAB in the last 72 hours. Lipid Profile: No results for input(s): CHOL, HDL, LDLCALC, TRIG, CHOLHDL, LDLDIRECT in the last 72 hours. Anemia Panel: No results for input(s): VITAMINB12, FOLATE, FERRITIN, TIBC, IRON, RETICCTPCT in the last 72 hours. Urine analysis:    Component Value Date/Time   COLORURINE ORANGE (A) 07/30/2016 1555   APPEARANCEUR CLEAR 07/30/2016 1555   LABSPEC 1.041 (H) 07/30/2016 1555   PHURINE 6.0 07/30/2016 1555   GLUCOSEU NEGATIVE 07/30/2016 1555   HGBUR NEGATIVE 07/30/2016 1555   BILIRUBINUR SMALL (A) 07/30/2016 1555   KETONESUR 40 (A) 07/30/2016 1555   PROTEINUR 30 (A) 07/30/2016 1555   NITRITE NEGATIVE 07/30/2016 1555   LEUKOCYTESUR TRACE (A) 07/30/2016 1555   Sepsis Labs: Invalid input(s): PROCALCITONIN, LACTICIDVEN   Time coordinating discharge: 35 minutes  SIGNED:  Mercy Riding, MD  [facility] 04/02/2020, 1:19 PM  If 7PM-7AM, please contact night-coverage [email or web address]

## 2020-04-03 DIAGNOSIS — R2689 Other abnormalities of gait and mobility: Secondary | ICD-10-CM | POA: Diagnosis not present

## 2020-04-03 DIAGNOSIS — E1159 Type 2 diabetes mellitus with other circulatory complications: Secondary | ICD-10-CM | POA: Diagnosis not present

## 2020-04-03 DIAGNOSIS — M75121 Complete rotator cuff tear or rupture of right shoulder, not specified as traumatic: Secondary | ICD-10-CM | POA: Diagnosis not present

## 2020-04-03 DIAGNOSIS — E039 Hypothyroidism, unspecified: Secondary | ICD-10-CM | POA: Diagnosis not present

## 2020-04-03 DIAGNOSIS — I739 Peripheral vascular disease, unspecified: Secondary | ICD-10-CM | POA: Diagnosis not present

## 2020-04-03 DIAGNOSIS — I110 Hypertensive heart disease with heart failure: Secondary | ICD-10-CM | POA: Diagnosis not present

## 2020-04-03 DIAGNOSIS — I5032 Chronic diastolic (congestive) heart failure: Secondary | ICD-10-CM | POA: Diagnosis not present

## 2020-04-03 DIAGNOSIS — R279 Unspecified lack of coordination: Secondary | ICD-10-CM | POA: Diagnosis not present

## 2020-04-03 DIAGNOSIS — S72001D Fracture of unspecified part of neck of right femur, subsequent encounter for closed fracture with routine healing: Secondary | ICD-10-CM | POA: Diagnosis not present

## 2020-04-03 DIAGNOSIS — I2510 Atherosclerotic heart disease of native coronary artery without angina pectoris: Secondary | ICD-10-CM | POA: Diagnosis not present

## 2020-04-03 DIAGNOSIS — M6281 Muscle weakness (generalized): Secondary | ICD-10-CM | POA: Diagnosis not present

## 2020-04-03 DIAGNOSIS — Z89432 Acquired absence of left foot: Secondary | ICD-10-CM | POA: Diagnosis not present

## 2020-04-03 DIAGNOSIS — E785 Hyperlipidemia, unspecified: Secondary | ICD-10-CM | POA: Diagnosis not present

## 2020-04-03 DIAGNOSIS — M25551 Pain in right hip: Secondary | ICD-10-CM | POA: Diagnosis not present

## 2020-04-03 DIAGNOSIS — E119 Type 2 diabetes mellitus without complications: Secondary | ICD-10-CM | POA: Diagnosis not present

## 2020-04-03 DIAGNOSIS — R278 Other lack of coordination: Secondary | ICD-10-CM | POA: Diagnosis not present

## 2020-04-03 DIAGNOSIS — R5381 Other malaise: Secondary | ICD-10-CM | POA: Diagnosis not present

## 2020-04-03 DIAGNOSIS — Z9181 History of falling: Secondary | ICD-10-CM | POA: Diagnosis not present

## 2020-04-03 DIAGNOSIS — Z89511 Acquired absence of right leg below knee: Secondary | ICD-10-CM | POA: Diagnosis not present

## 2020-04-03 DIAGNOSIS — I10 Essential (primary) hypertension: Secondary | ICD-10-CM | POA: Diagnosis not present

## 2020-04-03 DIAGNOSIS — Z743 Need for continuous supervision: Secondary | ICD-10-CM | POA: Diagnosis not present

## 2020-04-03 NOTE — Progress Notes (Signed)
[facility] to transport patient to [facility] via [facility]. Belongings with patient at bedside.

## 2020-04-07 ENCOUNTER — Telehealth: Payer: Self-pay

## 2020-04-07 NOTE — Telephone Encounter (Signed)
Lisa from [employer] called she is requesting orders for patients mobilizers and orders for patients wound care for the bandages on his shoulder and she is requesting any weight bearing status for patient along with any guidelines and precautions call [phone] Fax:[phone]

## 2020-04-07 NOTE — Telephone Encounter (Signed)
Called Lisa at [facility].  Explained to her that [name] can come out of the sling for comfort.  He is to do no overhead activity or reaching behind his back.  Wound care they can remove the sutures and let him get the wounds wet in the shower.  He can use the arm for immobilization using his walker.

## 2020-04-07 NOTE — Telephone Encounter (Signed)
Please advise 

## 2020-04-14 ENCOUNTER — Encounter: Payer: Self-pay | Admitting: Physician Assistant

## 2020-04-14 ENCOUNTER — Ambulatory Visit (INDEPENDENT_AMBULATORY_CARE_PROVIDER_SITE_OTHER): Payer: Medicare Other | Admitting: Physician Assistant

## 2020-04-14 DIAGNOSIS — Z9889 Other specified postprocedural states: Secondary | ICD-10-CM

## 2020-04-14 NOTE — Progress Notes (Signed)
HPI: Mr. [name] returns today 3 weeks status post right shoulder arthroscopy with debridement and rotator cuff repair.  He is overall doing well.  Unfortunately he did have a fall after his shoulder arthroscopy sustained a right femoral neck fracture and underwent total hip replacement by Dr. Sherrian Divers.  He is now at a skilled facility.  He states in regards to the right shoulder he is having no constant pain.  He is using a walker and places some weight through the arm.  Most of the time however using sling and swath.  Is been working on gentle range of motion of the right arm.   Physical exam: Right shoulder port sites are all well-healed no signs of infection.  Sutures have been removed.  Right elbow full range of motion full supination pronation forearm.  Impression: 3 weeks status post right shoulder arthroscopy with debridement and rotator cuff repair  Plan: He will continue range of motion of the elbow forearm wrist and hand.  No overhead activity right shoulder no extreme abduction or internal rotation.  See him back in 3 weeks at that time we will set him up for outpatient therapy to begin rehab of the shoulder.  Questions were encouraged and answered at length today.

## 2020-04-15 DIAGNOSIS — E119 Type 2 diabetes mellitus without complications: Secondary | ICD-10-CM | POA: Diagnosis not present

## 2020-04-15 DIAGNOSIS — S72001D Fracture of unspecified part of neck of right femur, subsequent encounter for closed fracture with routine healing: Secondary | ICD-10-CM | POA: Diagnosis not present

## 2020-04-15 DIAGNOSIS — E785 Hyperlipidemia, unspecified: Secondary | ICD-10-CM | POA: Diagnosis not present

## 2020-04-15 DIAGNOSIS — I10 Essential (primary) hypertension: Secondary | ICD-10-CM | POA: Diagnosis not present

## 2020-04-15 DIAGNOSIS — I2510 Atherosclerotic heart disease of native coronary artery without angina pectoris: Secondary | ICD-10-CM | POA: Diagnosis not present

## 2020-04-15 DIAGNOSIS — E039 Hypothyroidism, unspecified: Secondary | ICD-10-CM | POA: Diagnosis not present

## 2020-04-21 ENCOUNTER — Ambulatory Visit (INDEPENDENT_AMBULATORY_CARE_PROVIDER_SITE_OTHER): Payer: Medicare Other | Admitting: Orthopaedic Surgery

## 2020-04-21 ENCOUNTER — Ambulatory Visit (INDEPENDENT_AMBULATORY_CARE_PROVIDER_SITE_OTHER): Payer: Medicare Other

## 2020-04-21 DIAGNOSIS — M25551 Pain in right hip: Secondary | ICD-10-CM

## 2020-04-21 DIAGNOSIS — Z96641 Presence of right artificial hip joint: Secondary | ICD-10-CM

## 2020-04-21 NOTE — Progress Notes (Signed)
Post-Op Visit Note   Patient: [name]           Date of Birth: 04/07/1950           MRN: [id number] Visit Date: 04/21/2020 PCP: [name], MD   Assessment & Plan:  Chief Complaint:  Chief Complaint  Patient presents with  . Right Shoulder - Follow-up   Visit Diagnoses:  1. Status post total replacement of right hip   2. Pain in right hip     Plan:   [name] is 3-week status post right total hip replacement for femoral neck fracture.  He has recently status post right shoulder scope and rotator cuff repair by Dr.Blackman.  He is currently living at [facility] but is planning to be discharged next Wednesday to his home.  He has no complaints about the right hip.  He feels like his leg lengths are equal.  Currently he is ambulating with a walker.  Surgical incision is intact and healed no signs of infection.  X-rays show total hip placement without any complications.  He has decent range of motion of the hip without pain.  [name] is 3 weeks status post right total hip replacement.  He is doing well overall and at this point he can discontinue the Lovenox and take baby aspirin twice a day for the next month.  I like to recheck him in 4 weeks with standing AP pelvis x-rays.  Continue posterior hip precautions for a total of 3 months.  Follow-Up Instructions: Return in about 4 weeks (around 05/19/2020).   Orders:  Orders Placed This Encounter  Procedures  . XR HIP UNILAT W OR W/O PELVIS 2-3 VIEWS RIGHT   No orders of the defined types were placed in this encounter.   Imaging: XR HIP UNILAT W OR W/O PELVIS 2-3 VIEWS RIGHT  Result Date: 04/21/2020 Stable right total hip replacement without complication.   PMFS History: Patient Active Problem List   Diagnosis Date Noted  . Closed fracture of neck of right femur ([place])   . Hip fracture ([place]) 03/27/2020  . Nontraumatic complete tear of right rotator cuff 02/26/2020  . S/P transmetatarsal amputation of foot, left ([place])  04/25/2016  . Chronic osteomyelitis of toe, left ([place])   . Cellulitis of toe of left foot   . Chest pain 09/25/2015  . CAD (coronary artery disease) of artery bypass graft 09/25/2015  . Sinus bradycardia on ECG 09/25/2015  . Chronic diastolic CHF (congestive heart failure) ([place]) 09/25/2015  . Chest pain at rest 09/25/2015  . Aortic valvular disorder 08/04/2014  . Disorder of mitral valve 08/04/2014  . Diabetic foot infection ([place]) 06/03/2014  . Peripheral vascular disease ([place]) 06/03/2014  . Abscess of foot 06/03/2014  . CAD (coronary artery disease) 07/29/2013  . Trifascicular block 07/29/2013  . 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 Hypothyroidism 01/22/2007  . Type 2 diabetes mellitus with vascular disease ([place]) 01/22/2007  . HYPERLIPIDEMIA, MIXED 01/22/2007  . MYOCARDIAL INFARCTION, HX OF 01/22/2007  . DEGENERATIVE JOINT DISEASE 01/22/2007  . ANEMIA, IRON DEFICIENCY, HX OF 01/22/2007   Past Medical History:  Diagnosis Date  . Anemia    low iron  . Arthritis   . Coronary artery disease 2006   a. remote stenting to ramus, prox LAD x2.  . Dental crowns present   . Diabetes mellitus   . First degree AV block   . GERD (gastroesophageal reflux disease)   . Heart attack ([place]) 06/2004  .  Heart murmur    aortic  . Hx MRSA infection 02/2006  . Hyperlipidemia   . Hypertension    hx. of - has not been on med. since losing wt. after gastric bypass  . Hypothyroidism   . Morbid obesity ([place])   . Mucoid cyst of joint 09/2011   left thumb  . Sleep apnea    sleep study 03/29/2011; no CPAP use, lost >130lbs  . Trifascicular block     Family History  Problem Relation Age of Onset  . Heart disease Mother     Past Surgical History:  Procedure Laterality Date  . AMPUTATION Left 04/19/2016   Procedure: Left Foot 4th Toe Amputation vs. Transmetatarsal;  Surgeon: [name], MD;  Location: [facility];  Service: Orthopedics;  Laterality: Left;  . BIOPSY  04/07/2019    Procedure: BIOPSY;  Surgeon: Otis Brace, MD;  Location: [facility];  Service: Gastroenterology;;  . CATARACT EXTRACTION  02/2010; 03/2010  . COLONOSCOPY WITH PROPOFOL N/A 09/14/2014   Procedure: COLONOSCOPY WITH PROPOFOL;  Surgeon: Garlan Fair, MD;  Location: [facility];  Service: Endoscopy;  Laterality: N/A;  . COLONOSCOPY WITH PROPOFOL N/A 04/07/2019   Procedure: COLONOSCOPY WITH PROPOFOL;  Surgeon: Otis Brace, MD;  Location: [facility];  Service: Gastroenterology;  Laterality: N/A;  . CORONARY ANGIOPLASTY WITH STENT PLACEMENT  07/12/2004; 08/03/2004   total of 3 stents  . I & D EXTREMITY Right 06/04/2014   Procedure: IRRIGATION AND DEBRIDEMENT EXTREMITY;  Surgeon: [name], MD;  Location: [facility];  Service: Orthopedics;  Laterality: Right;  . MASS EXCISION  10/04/2011   Procedure: EXCISION MASS;  Surgeon: Wynonia Sours, MD;  Location: [facility];  Service: Orthopedics;  Laterality: Left;  excision cyst debridment ip joint of left thumb  . PROSTATECTOMY    . right below knee amputation  2018  . ROUX-EN-Y GASTRIC BYPASS  10/10/2010   laparoscopic  . SHOULDER ARTHROSCOPY WITH ROTATOR CUFF REPAIR AND SUBACROMIAL DECOMPRESSION Right 03/23/2020   Procedure: RIGHT SHOULDER ARTHROSCOPY WITH DEBRIDEMENT AND  ROTATOR CUFF REPAIR;  Surgeon: [name], MD;  Location: [facility];  Service: Orthopedics;  Laterality: Right;  . TOE AMPUTATION  02/23/2006   left foot second ray amputation  . TOTAL HIP ARTHROPLASTY Right 03/29/2020   Procedure: TOTAL HIP ARTHROPLASTY POSTERIOR APPROACH;  Surgeon: [name], MD;  Location: [facility];  Service: Orthopedics;  Laterality: Right;  . TRANSMETATARSAL AMPUTATION Left   . TRIGGER FINGER RELEASE Right 09/16/2019   Procedure: RELEASE TRIGGER FINGER/A-1 PULLEY;  Surgeon: Daryll Brod, MD;  Location: [facility];  Service: Orthopedics;  Laterality: Right;  IV REGIONAL FOREARM BLOCK   Social History    Occupational History  . Occupation: Engineer, maintenance (IT) taxes  Tobacco Use  . Smoking status: Never Smoker  . Smokeless tobacco: Never Used  Substance and Sexual Activity  . Alcohol use: No  . Drug use: No  . Sexual activity: Yes

## 2020-04-24 ENCOUNTER — Encounter: Payer: Self-pay | Admitting: Orthopaedic Surgery

## 2020-05-05 ENCOUNTER — Ambulatory Visit (INDEPENDENT_AMBULATORY_CARE_PROVIDER_SITE_OTHER): Payer: Medicare Other

## 2020-05-05 ENCOUNTER — Ambulatory Visit (INDEPENDENT_AMBULATORY_CARE_PROVIDER_SITE_OTHER): Payer: Medicare Other | Admitting: Physician Assistant

## 2020-05-05 DIAGNOSIS — Z9889 Other specified postprocedural states: Secondary | ICD-10-CM

## 2020-05-05 DIAGNOSIS — Z96641 Presence of right artificial hip joint: Secondary | ICD-10-CM | POA: Diagnosis not present

## 2020-05-05 NOTE — Progress Notes (Addendum)
HPI: Mr. [name] returns today follow-up of his right shoulder rotator cuff repair which was done arthroscopically on 03/23/2020.  He overall is doing well he continues wearing the sling and swath.  He is working on gentle range of motion elbow forearm hand.  He has no complaints.  Physical exam: Excellent range of motion of the right forearm wrist and hand.  Port sites shoulder are well-healed.  No attempts of range of motion right shoulder today.  Impression: Status post right shoulder arthroscopy with rotator cuff repair 03/23/2020  Plan: We will send him to outpatient therapy to begin working on range of motion strengthening of the right  shoulder.  He can wear the sling for comfort.  Questions were encouraged and answered.  He will follow up with [place] in 1 month.

## 2020-05-06 NOTE — Addendum Note (Signed)
Addended by: Robyne Peers on: 05/06/2020 08:21 AM   Modules accepted: Orders

## 2020-05-10 ENCOUNTER — Encounter: Payer: Self-pay | Admitting: Orthopaedic Surgery

## 2020-05-11 NOTE — Telephone Encounter (Signed)
Outpatient is totally fine.  Can you put in order please.  Thanks.

## 2020-05-13 ENCOUNTER — Encounter: Payer: Self-pay | Admitting: Orthopaedic Surgery

## 2020-05-18 ENCOUNTER — Encounter: Payer: Self-pay | Admitting: Physical Therapy

## 2020-05-18 ENCOUNTER — Other Ambulatory Visit: Payer: Self-pay

## 2020-05-18 ENCOUNTER — Ambulatory Visit: Payer: Medicare Other | Admitting: Rehabilitative and Restorative Service Providers"

## 2020-05-18 ENCOUNTER — Ambulatory Visit (INDEPENDENT_AMBULATORY_CARE_PROVIDER_SITE_OTHER): Payer: Medicare Other | Admitting: Physical Therapy

## 2020-05-18 DIAGNOSIS — M6281 Muscle weakness (generalized): Secondary | ICD-10-CM

## 2020-05-18 DIAGNOSIS — R293 Abnormal posture: Secondary | ICD-10-CM | POA: Diagnosis not present

## 2020-05-18 DIAGNOSIS — R600 Localized edema: Secondary | ICD-10-CM

## 2020-05-18 DIAGNOSIS — M25611 Stiffness of right shoulder, not elsewhere classified: Secondary | ICD-10-CM

## 2020-05-18 DIAGNOSIS — M25511 Pain in right shoulder: Secondary | ICD-10-CM | POA: Diagnosis not present

## 2020-05-18 NOTE — Therapy (Signed)
[facility] Physical Therapy [address] [place], [place], [address] Phone: [phone]   Fax:  [phone]  Physical Therapy Evaluation  Patient Details  Name: [name] MRN: [id number] Date of Birth: [birth year] Referring Provider (PT): Benita Stabile, [place]   Encounter Date: 05/18/2020   PT End of Session - 05/18/20 1509    Visit Number 1    Number of Visits 17    Date for PT Re-Evaluation 07/09/20    Authorization Type Medicare & BCBS    Progress Note Due on Visit 10    PT Start Time 1300    PT Stop Time 1347    PT Time Calculation (min) 47 min    Activity Tolerance Patient tolerated treatment well    Behavior During Therapy [facility] for tasks assessed/performed           Past Medical History:  Diagnosis Date  . Anemia    low iron  . Arthritis   . Coronary artery disease 2006   a. remote stenting to ramus, prox LAD x2.  . Dental crowns present   . Diabetes mellitus   . First degree AV block   . GERD (gastroesophageal reflux disease)   . Heart attack ([place]) 06/2004  . Heart murmur    aortic  . Hx MRSA infection 02/2006  . Hyperlipidemia   . Hypertension    hx. of - has not been on med. since losing wt. after gastric bypass  . Hypothyroidism   . Morbid obesity ([place])   . Mucoid cyst of joint 09/2011   left thumb  . Sleep apnea    sleep study 03/29/2011; no CPAP use, lost >130lbs  . Trifascicular block     Past Surgical History:  Procedure Laterality Date  . AMPUTATION Left 04/19/2016   Procedure: Left Foot 4th Toe Amputation vs. Transmetatarsal;  Surgeon: [name], MD;  Location: [place];  Service: Orthopedics;  Laterality: Left;  . BIOPSY  04/07/2019   Procedure: BIOPSY;  Surgeon: Otis Brace, MD;  [facility];  Service: Gastroenterology;;  . CATARACT EXTRACTION  02/2010; 03/2010  . COLONOSCOPY WITH PROPOFOL N/A 09/14/2014   Procedure: COLONOSCOPY WITH PROPOFOL;  Surgeon: Garlan Fair, MD;  [facility];  Service:  Endoscopy;  Laterality: N/A;  . COLONOSCOPY WITH PROPOFOL N/A 04/07/2019   Procedure: COLONOSCOPY WITH PROPOFOL;  Surgeon: Otis Brace, MD;  [facility];  Service: Gastroenterology;  Laterality: N/A;  . CORONARY ANGIOPLASTY WITH STENT PLACEMENT  07/12/2004; 08/03/2004   total of 3 stents  . I & D EXTREMITY Right 06/04/2014   Procedure: IRRIGATION AND DEBRIDEMENT EXTREMITY;  Surgeon: [name], MD;  Location: [place];  Service: Orthopedics;  Laterality: Right;  . MASS EXCISION  10/04/2011   Procedure: EXCISION MASS;  Surgeon: Wynonia Sours, MD;  Location: [place];  Service: Orthopedics;  Laterality: Left;  excision cyst debridment ip joint of left thumb  . PROSTATECTOMY    . right below knee amputation  2018  . ROUX-EN-Y GASTRIC BYPASS  10/10/2010   laparoscopic  . SHOULDER ARTHROSCOPY WITH ROTATOR CUFF REPAIR AND SUBACROMIAL DECOMPRESSION Right 03/23/2020   Procedure: RIGHT SHOULDER ARTHROSCOPY WITH DEBRIDEMENT AND  ROTATOR CUFF REPAIR;  Surgeon: [name], MD;  Location: [place];  Service: Orthopedics;  Laterality: Right;  . TOE AMPUTATION  02/23/2006   left foot second ray amputation  . TOTAL HIP ARTHROPLASTY Right 03/29/2020   Procedure: TOTAL HIP ARTHROPLASTY POSTERIOR APPROACH;  Surgeon: [name], MD;  Location: [place];  Service: Orthopedics;  Laterality: Right;  . TRANSMETATARSAL AMPUTATION Left   . TRIGGER FINGER RELEASE Right 09/16/2019   Procedure: RELEASE TRIGGER FINGER/A-1 PULLEY;  Surgeon: Daryll Brod, MD;  Location: [place];  Service: Orthopedics;  Laterality: Right;  IV REGIONAL FOREARM BLOCK    There were no vitals filed for this visit.    Subjective Assessment - 05/18/20 1307    Subjective This 70yo male was referred to PT by Benita Stabile, PA with [id number] (ICD-10-CM) - S/P arthroscopy of right shoulder performed on 03/23/2020. He has history of right Transtibial Amputation 09/27/2016 & was active with prosthesis.  He fell after shoulder sg with right hip fracture. He underwent a right Total Hip Arthroplasty posterior approach on 03/29/2020. prosthetist is having to work with new sockets with foam or flexible inner liners due to edema in right leg.    Patient is accompained by: Family member    Pertinent History right TTA, right shoulder arthroscopy, right THA. CHF, CAD, PVD, DM2, MI    Patient Stated Goals to use right shoulder & hip to return to high level of function with his prosthesis.    Currently in Pain? Yes    Pain Score 2     Pain Location Shoulder    Pain Orientation Right;Anterior    Pain Descriptors / Indicators Throbbing    Pain Type Surgical pain    Pain Onset More than a month ago    Pain Frequency Intermittent    Aggravating Factors  unknown    Pain Relieving Factors tylenol              [facility] PT Assessment - 05/18/20 1300      Assessment   Medical Diagnosis Right shoulder RTC repair    Referring Provider (PT) Benita Stabile, PA    Onset Date/Surgical Date 03/23/20    Hand Dominance Right    Prior [place] 3.5weeks for rehab with focus on hip. OT worked with LUE but did not address RUE.      Precautions   Precautions Posterior Hip;Shoulder      Balance Screen   Has the patient fallen in the past 6 months Yes    How many times? 1    Has the patient had a decrease in activity level because of a fear of falling?  Yes    Is the patient reluctant to leave their home because of a fear of falling?  No      Home Environment   Living Environment Private residence    Living Arrangements Spouse/significant other    Type of [place] to enter    Entrance Stairs-Number of Steps 2    Entrance Stairs-Rails --   hand hold grab bar on each side   Home Layout Two level;Able to live on main level with bedroom/bathroom;Full bath on main level    Alternate Level Stairs-Number of Steps 16    Alternate Level Stairs-Rails Right    Home Equipment Cane -  single point;Walker - 2 wheels      Prior Function   Level of Independence Independent with household mobility without device;Independent with community mobility without device   prosthesis only   Vocation Retired    Leisure reading, computer,      Observation/Other Assessments   Focus on Therapeutic Outcomes (FOTO)  52.2100   target score 67%     Posture/Postural Control   Posture/Postural Control Postural limitations  Postural Limitations Rounded Shoulders;Forward head;Increased thoracic kyphosis      ROM / Strength   AROM / PROM / Strength PROM;Strength      PROM   Right Shoulder Flexion 117 Degrees   supine   Right Shoulder ABduction 81 Degrees   supine   Right Shoulder Internal Rotation 15 Degrees   supine with abduction 60*   Right Shoulder External Rotation 24 Degrees   supine with abduction 60*     Strength   Overall Strength Deficits    Strength Assessment Site Shoulder    Right Shoulder Flexion 2-/5    Right Shoulder Extension 2-/5    Right Shoulder ABduction 2-/5    Right Shoulder Internal Rotation 2-/5    Right Shoulder External Rotation 2-/5    Right Shoulder Horizontal ABduction 2-/5    Right Shoulder Horizontal ADduction 2-/5      Ambulation/Gait   Ambulation/Gait Yes    Ambulation/Gait Assistance 6: Modified independent (Device/Increase time)    Assistive device Straight cane;Prosthesis                      Objective measurements completed on examination: See above findings.       [place] Adult PT Treatment/Exercise - 05/18/20 1300      Exercises   Exercises Shoulder;Knee/Hip      Neck Exercises: Supine   Neck Retraction 10 reps;5 secs    Neck Retraction Limitations tactile & verbal cues on technique      Shoulder Exercises: Supine   External Rotation AAROM;Right;10 reps    External Rotation Limitations tactile & verbal cues on technique    Flexion AAROM;Right;10 reps    Flexion Limitations tactile & verbal cues on technique     ABduction AAROM;Right;10 reps    ABduction Limitations tactile & verbal cues on technique      Shoulder Exercises: Seated   Retraction AROM;Both;10 reps    Retraction Limitations tactile & verbal cues on technique    Other Seated Exercises scapular depression 10 reps tactile & verbal cues on technique                  PT Education - 05/18/20 1333    Education Details Medbridge Access Code: [email or web address]    Person(s) Educated Patient    Methods Explanation;Demonstration;Tactile cues;Verbal cues;Handout    Comprehension Verbalized understanding;Returned demonstration;Verbal cues required;Tactile cues required;Need further instruction            PT Short Term Goals - 05/18/20 1612      PT SHORT TERM GOAL #1   Title Patient demonstrates understanding of HEP    Time 4    Period Weeks    Status New    Target Date 06/11/20      PT SHORT TERM GOAL #2   Title right shoulder PROM flexion 120*, abduction 100* and Ext Rot 45*    Time 4    Period Weeks    Status New    Target Date 06/11/20             PT Long Term Goals - 05/18/20 1607      PT LONG TERM GOAL #1   Title Patient FOTO score 67%    Time 8    Period Weeks    Status New    Target Date 07/09/20      PT LONG TERM GOAL #2   Title Right shoulder PROM within normal limits    Time 8    Period Weeks  Status New    Target Date 07/09/20      PT LONG TERM GOAL #3   Title Right shoulder strength 5/5    Time 8    Period Weeks    Status New    Target Date 07/09/20      PT LONG TERM GOAL #4   Title Patient reports right shoulder pain 0/10    Time 8    Period Weeks    Status New    Target Date 07/09/20                  Plan - 05/18/20 1513    Clinical Impression Statement This 70yo male has history of right Transtibial Amputation in 2018 and was fully functional with prosthesis in community.  He had a rotator cuff tear on dominant right arm requiring surgical repair.  He fell trying to get to  bathroom on kneel walker and fractured his right hip requiring total hip arthroplasty with posterior approach.  He was referred to PT for shoulder rehab. He sees his orthopedist for hip on 05/19/2020 and anticipates PT referral to include hip at that time. He has poor upper body posture which impairs proper shoulder function. He has pain in his right shoulder limiting movement. He has decreased shoulder range & strength.  PT instructed in initial HEP for scapula & shoulder which he appears to understand.  He would benefit from skilled PT to improve function of his dominant arm.    Personal Factors and Comorbidities Age;Comorbidity 3+    Comorbidities right TTA, right shoulder arthroscopy, right THA. CHF, CAD, PVD, DM2, MI    Examination-Activity Limitations Carry;Lift;Locomotion Level;Reach Overhead    Examination-Participation Restrictions Community Activity    Stability/Clinical Decision Making Evolving/Moderate complexity    Clinical Decision Making Low    Rehab Potential Good    PT Frequency 2x / week    PT Duration 8 weeks    PT Treatment/Interventions ADLs/Self Care Home Management;Cryotherapy;Electrical Stimulation;Ultrasound;Therapeutic activities;Therapeutic exercise;Neuromuscular re-education;Patient/family education;Manual techniques;Passive range of motion;Dry needling;Vasopneumatic Device;Joint Manipulations    PT Next Visit Plan update HEP, PROM & manual therapy to increase shoulder range, assess & update plan of care to include hip when Dr. [name] refers    Consulted and Agree with Plan of Care Patient;Family member/caregiver    Family Member Consulted wife           Patient will benefit from skilled therapeutic intervention in order to improve the following deficits and impairments:  Decreased strength,Decreased mobility,Decreased range of motion,Increased edema,Impaired flexibility,Postural dysfunction,Pain  Visit Diagnosis: Acute pain of right shoulder  Stiffness of right  shoulder, not elsewhere classified  Muscle weakness (generalized)  Abnormal posture  Localized edema     Problem List Patient Active Problem List   Diagnosis Date Noted  . Closed fracture of neck of right femur ([place])   . Hip fracture ([place]) 03/27/2020  . Nontraumatic complete tear of right rotator cuff 02/26/2020  . S/P transmetatarsal amputation of foot, left ([place]) 04/25/2016  . Chronic osteomyelitis of toe, left ([place])   . Cellulitis of toe of left foot   . Chest pain 09/25/2015  . CAD (coronary artery disease) of artery bypass graft 09/25/2015  . Sinus bradycardia on ECG 09/25/2015  . Chronic diastolic CHF (congestive heart failure) ([place]) 09/25/2015  . Chest pain at rest 09/25/2015  . Aortic valvular disorder 08/04/2014  . Disorder of mitral valve 08/04/2014  . Diabetic foot infection ([place]) 06/03/2014  . Peripheral vascular disease ([place]) 06/03/2014  .  Abscess of foot 06/03/2014  . CAD (coronary artery disease) 07/29/2013  . Trifascicular block 07/29/2013  . 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 Hypothyroidism 01/22/2007  . Type 2 diabetes mellitus with vascular disease ([place]) 01/22/2007  . HYPERLIPIDEMIA, MIXED 01/22/2007  . MYOCARDIAL INFARCTION, HX OF 01/22/2007  . DEGENERATIVE JOINT DISEASE 01/22/2007  . ANEMIA, IRON DEFICIENCY, HX OF 01/22/2007    Jamey Reas, PT, DPT 05/18/2020, 4:15 PM  [facility] Physical Therapy [address] [place], [place], [address] Phone: [phone]   Fax:  [phone]  Name: [name] MRN: [id number] Date of Birth: 1951-01-02

## 2020-05-18 NOTE — Patient Instructions (Signed)
Access Code: [email or web address] URL: [email or web address] Date: 05/18/2020 Prepared by: Jamey Reas  Exercises Supine Cervical Retraction with Towel - 1 x daily - 7 x weekly - 2 sets - 10 reps - 5 seconds hold Supine Scapular Retraction - 1 x daily - 7 x weekly - 2 sets - 10 reps - 5 seconds hold Standing Scapular Depression - 1 x daily - 7 x weekly - 2 sets - 10 reps - 5 seconds hold Supine Shoulder Flexion AAROM with Hands Clasped - 3 x daily - 7 x weekly - 2 sets - 10 reps - 10 seconds hold Supine Shoulder Abduction AAROM with Dowel - 3 x daily - 7 x weekly - 2 sets - 10 reps - 10 seconds hold Supine Shoulder External Rotation in 45 Degrees Abduction AAROM with Dowel - 3 x daily - 7 x weekly - 2 sets - 10 reps - 10 seconds hold

## 2020-05-19 ENCOUNTER — Ambulatory Visit (INDEPENDENT_AMBULATORY_CARE_PROVIDER_SITE_OTHER): Payer: Medicare Other | Admitting: Physical Therapy

## 2020-05-19 ENCOUNTER — Encounter: Payer: Self-pay | Admitting: Orthopaedic Surgery

## 2020-05-19 ENCOUNTER — Ambulatory Visit (INDEPENDENT_AMBULATORY_CARE_PROVIDER_SITE_OTHER): Payer: Medicare Other | Admitting: Orthopaedic Surgery

## 2020-05-19 DIAGNOSIS — Z96641 Presence of right artificial hip joint: Secondary | ICD-10-CM

## 2020-05-19 DIAGNOSIS — M25611 Stiffness of right shoulder, not elsewhere classified: Secondary | ICD-10-CM

## 2020-05-19 DIAGNOSIS — M6281 Muscle weakness (generalized): Secondary | ICD-10-CM | POA: Diagnosis not present

## 2020-05-19 DIAGNOSIS — R293 Abnormal posture: Secondary | ICD-10-CM | POA: Diagnosis not present

## 2020-05-19 DIAGNOSIS — M25511 Pain in right shoulder: Secondary | ICD-10-CM | POA: Diagnosis not present

## 2020-05-19 DIAGNOSIS — R600 Localized edema: Secondary | ICD-10-CM | POA: Diagnosis not present

## 2020-05-19 NOTE — Therapy (Signed)
[facility] Physical Therapy [address] [place], [place], [address] Phone: [phone]   Fax:  [phone]  Physical Therapy Treatment  Patient Details  Name: [name] MRN: [id number] Date of Birth: January 17, 1951 Referring Provider (PT): Benita Stabile, [place]   Encounter Date: 05/19/2020   PT End of Session - 05/19/20 0954    Visit Number 2    Number of Visits 17    Date for PT Re-Evaluation 07/09/20    Authorization Type Medicare & BCBS    Progress Note Due on Visit 10    PT Start Time 0923    PT Stop Time 1003    PT Time Calculation (min) 40 min    Activity Tolerance Patient tolerated treatment well    Behavior During Therapy [facility] for tasks assessed/performed           Past Medical History:  Diagnosis Date  . Anemia    low iron  . Arthritis   . Coronary artery disease 2006   a. remote stenting to ramus, prox LAD x2.  . Dental crowns present   . Diabetes mellitus   . First degree AV block   . GERD (gastroesophageal reflux disease)   . Heart attack ([place]) 06/2004  . Heart murmur    aortic  . Hx MRSA infection 02/2006  . Hyperlipidemia   . Hypertension    hx. of - has not been on med. since losing wt. after gastric bypass  . Hypothyroidism   . Morbid obesity ([place])   . Mucoid cyst of joint 09/2011   left thumb  . Sleep apnea    sleep study 03/29/2011; no CPAP use, lost >130lbs  . Trifascicular block     Past Surgical History:  Procedure Laterality Date  . AMPUTATION Left 04/19/2016   Procedure: Left Foot 4th Toe Amputation vs. Transmetatarsal;  Surgeon: [name], MD;  Location: [place];  Service: Orthopedics;  Laterality: Left;  . BIOPSY  04/07/2019   Procedure: BIOPSY;  Surgeon: Otis Brace, MD;  [facility];  Service: Gastroenterology;;  . CATARACT EXTRACTION  02/2010; 03/2010  . COLONOSCOPY WITH PROPOFOL N/A 09/14/2014   Procedure: COLONOSCOPY WITH PROPOFOL;  Surgeon: Garlan Fair, MD;  [facility];  Service:  Endoscopy;  Laterality: N/A;  . COLONOSCOPY WITH PROPOFOL N/A 04/07/2019   Procedure: COLONOSCOPY WITH PROPOFOL;  Surgeon: Otis Brace, MD;  [facility];  Service: Gastroenterology;  Laterality: N/A;  . CORONARY ANGIOPLASTY WITH STENT PLACEMENT  07/12/2004; 08/03/2004   total of 3 stents  . I & D EXTREMITY Right 06/04/2014   Procedure: IRRIGATION AND DEBRIDEMENT EXTREMITY;  Surgeon: [name], MD;  Location: [place];  Service: Orthopedics;  Laterality: Right;  . MASS EXCISION  10/04/2011   Procedure: EXCISION MASS;  Surgeon: Wynonia Sours, MD;  Location: [place];  Service: Orthopedics;  Laterality: Left;  excision cyst debridment ip joint of left thumb  . PROSTATECTOMY    . right below knee amputation  2018  . ROUX-EN-Y GASTRIC BYPASS  10/10/2010   laparoscopic  . SHOULDER ARTHROSCOPY WITH ROTATOR CUFF REPAIR AND SUBACROMIAL DECOMPRESSION Right 03/23/2020   Procedure: RIGHT SHOULDER ARTHROSCOPY WITH DEBRIDEMENT AND  ROTATOR CUFF REPAIR;  Surgeon: [name], MD;  Location: [place];  Service: Orthopedics;  Laterality: Right;  . TOE AMPUTATION  02/23/2006   left foot second ray amputation  . TOTAL HIP ARTHROPLASTY Right 03/29/2020   Procedure: TOTAL HIP ARTHROPLASTY POSTERIOR APPROACH;  Surgeon: [name], MD;  Location: [place];  Service: Orthopedics;  Laterality: Right;  . TRANSMETATARSAL AMPUTATION Left   . TRIGGER FINGER RELEASE Right 09/16/2019   Procedure: RELEASE TRIGGER FINGER/A-1 PULLEY;  Surgeon: Daryll Brod, MD;  Location: [place];  Service: Orthopedics;  Laterality: Right;  IV REGIONAL FOREARM BLOCK    There were no vitals filed for this visit.   Subjective Assessment - 05/19/20 0925    Subjective He relays about 2/10 pain in his Rt shoulder upon arrival. He sees Dr.Xu right after his PT appointment about his Rt THA. We are anticipating PT referral for this as well.    Patient is accompained by: Family member     Pertinent History right TTA, right shoulder arthroscopy, right THA. CHF, CAD, PVD, DM2, MI    Patient Stated Goals to use right shoulder & hip to return to high level of function with his prosthesis.    Pain Onset More than a month ago              [facility] Adult PT Treatment/Exercise - 05/19/20 0001      Exercises   Exercises Shoulder      Shoulder Exercises: Supine   External Rotation AAROM;Right;15 reps    Flexion AAROM;Right;15 reps    ABduction AAROM;Right;15 reps      Shoulder Exercises: Standing   Retraction Both;15 reps    Other Standing Exercises pendulums X15 circles      Shoulder Exercises: Pulleys   Flexion 3 minutes    ABduction 3 minutes      Modalities   Modalities Vasopneumatic      Vasopneumatic   Number Minutes Vasopneumatic  10 minutes    Vasopnuematic Location  Shoulder    Vasopneumatic Pressure Medium    Vasopneumatic Temperature  34      Manual Therapy   Manual therapy comments Rt shoulder Grade 2 GH mobs for distraction, A-P, inferior mobs, Rt shoulder PROM all planes to tolerance                    PT Short Term Goals - 05/18/20 1612      PT SHORT TERM GOAL #1   Title Patient demonstrates understanding of HEP    Time 4    Period Weeks    Status New    Target Date 06/11/20      PT SHORT TERM GOAL #2   Title right shoulder PROM flexion 120*, abduction 100* and Ext Rot 45*    Time 4    Period Weeks    Status New    Target Date 06/11/20             PT Long Term Goals - 05/18/20 1607      PT LONG TERM GOAL #1   Title Patient FOTO score 67%    Time 8    Period Weeks    Status New    Target Date 07/09/20      PT LONG TERM GOAL #2   Title Right shoulder PROM within normal limits    Time 8    Period Weeks    Status New    Target Date 07/09/20      PT LONG TERM GOAL #3   Title Right shoulder strength 5/5    Time 8    Period Weeks    Status New    Target Date 07/09/20      PT LONG TERM GOAL #4   Title Patient  reports right shoulder pain 0/10  Time 8    Period Weeks    Status New    Target Date 07/09/20                 Plan - 05/19/20 0956    Clinical Impression Statement Session focused on regaining Rt shoulder ROM post op RTC repair. He had good toleance to session. Used Vasopnuematic after session to decrease overall edeam and sorneess in his Rt shoulder. We will await to see if we can also treat his Rt hip S/P THA after he has follow up with MD about this today.    Personal Factors and Comorbidities Age;Comorbidity 3+    Comorbidities right TTA, right shoulder arthroscopy, right THA. CHF, CAD, PVD, DM2, MI    Examination-Activity Limitations Carry;Lift;Locomotion Level;Reach Overhead    Examination-Participation Restrictions Community Activity    Stability/Clinical Decision Making Evolving/Moderate complexity    Rehab Potential Good    PT Frequency 2x / week    PT Duration 8 weeks    PT Treatment/Interventions ADLs/Self Care Home Management;Cryotherapy;Electrical Stimulation;Ultrasound;Therapeutic activities;Therapeutic exercise;Neuromuscular re-education;Patient/family education;Manual techniques;Passive range of motion;Dry needling;Vasopneumatic Device;Joint Manipulations    PT Next Visit Plan update HEP, PROM & manual therapy to increase shoulder range, assess & update plan of care to include hip when Dr. [name] refers    Consulted and Agree with Plan of Care Patient;Family member/caregiver    Family Member Consulted wife           Patient will benefit from skilled therapeutic intervention in order to improve the following deficits and impairments:  Decreased strength,Decreased mobility,Decreased range of motion,Increased edema,Impaired flexibility,Postural dysfunction,Pain  Visit Diagnosis: Acute pain of right shoulder  Stiffness of right shoulder, not elsewhere classified  Muscle weakness (generalized)  Abnormal posture  Localized edema     Problem List Patient  Active Problem List   Diagnosis Date Noted  . Closed fracture of neck of right femur ([place])   . Hip fracture ([place]) 03/27/2020  . Nontraumatic complete tear of right rotator cuff 02/26/2020  . S/P transmetatarsal amputation of foot, left ([place]) 04/25/2016  . Chronic osteomyelitis of toe, left ([place])   . Cellulitis of toe of left foot   . Chest pain 09/25/2015  . CAD (coronary artery disease) of artery bypass graft 09/25/2015  . Sinus bradycardia on ECG 09/25/2015  . Chronic diastolic CHF (congestive heart failure) ([place]) 09/25/2015  . Chest pain at rest 09/25/2015  . Aortic valvular disorder 08/04/2014  . Disorder of mitral valve 08/04/2014  . Diabetic foot infection ([place]) 06/03/2014  . Peripheral vascular disease ([place]) 06/03/2014  . Abscess of foot 06/03/2014  . CAD (coronary artery disease) 07/29/2013  . Trifascicular block 07/29/2013  . 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 Hypothyroidism 01/22/2007  . Type 2 diabetes mellitus with vascular disease ([place]) 01/22/2007  . HYPERLIPIDEMIA, MIXED 01/22/2007  . MYOCARDIAL INFARCTION, HX OF 01/22/2007  . DEGENERATIVE JOINT DISEASE 01/22/2007  . ANEMIA, IRON DEFICIENCY, HX OF 01/22/2007    Silvestre Mesi 05/19/2020, 9:58 AM  [facility] Physical Therapy [address] [place], [place], [address] Phone: [phone]   Fax:  [phone]  Name: Dow Blahnik MRN: [id number] Date of Birth: April 04, 1950

## 2020-05-19 NOTE — Progress Notes (Signed)
Post-Op Visit Note   Patient: [name]           Date of Birth: July 18, 1950           MRN: [id number] Visit Date: 05/19/2020 PCP: [name], MD   Assessment & Plan:  Chief Complaint:  Chief Complaint  Patient presents with  . Right Shoulder - Pain   Visit Diagnoses:  1. Status post total replacement of right hip     Plan:   [name] is approximately 8 weeks status post right total hip replacement femoral neck fracture.  He has very mild discomfort when ambulating.  He is using a cane outside the home.  Currently doing home exercises.  He has been discharged from the SNF.  He also recently started PT for his shoulder.  Right hip scar is fully healed.  Good range of motion without pain.  He will continue posterior hip precautions for another month.  He will continue with home exercises and walking for his rehab.  We will recheck him in a month with standing AP pelvis and lateral hip x-rays.  Dental prophylaxis reinforced today.  Follow-Up Instructions: Return in about 4 weeks (around 06/16/2020).   Orders:  No orders of the defined types were placed in this encounter.  No orders of the defined types were placed in this encounter.   Imaging: No results found.  PMFS History: Patient Active Problem List   Diagnosis Date Noted  . Closed fracture of neck of right femur ([place])   . Hip fracture ([place]) 03/27/2020  . Nontraumatic complete tear of right rotator cuff 02/26/2020  . S/P transmetatarsal amputation of foot, left ([place]) 04/25/2016  . Chronic osteomyelitis of toe, left ([place])   . Cellulitis of toe of left foot   . Chest pain 09/25/2015  . CAD (coronary artery disease) of artery bypass graft 09/25/2015  . Sinus bradycardia on ECG 09/25/2015  . Chronic diastolic CHF (congestive heart failure) ([place]) 09/25/2015  . Chest pain at rest 09/25/2015  . Aortic valvular disorder 08/04/2014  . Disorder of mitral valve 08/04/2014  . Diabetic foot infection ([place]) 06/03/2014  .  Peripheral vascular disease ([place]) 06/03/2014  . Abscess of foot 06/03/2014  . CAD (coronary artery disease) 07/29/2013  . Trifascicular block 07/29/2013  . 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 Hypothyroidism 01/22/2007  . Type 2 diabetes mellitus with vascular disease ([place]) 01/22/2007  . HYPERLIPIDEMIA, MIXED 01/22/2007  . MYOCARDIAL INFARCTION, HX OF 01/22/2007  . DEGENERATIVE JOINT DISEASE 01/22/2007  . ANEMIA, IRON DEFICIENCY, HX OF 01/22/2007   Past Medical History:  Diagnosis Date  . Anemia    low iron  . Arthritis   . Coronary artery disease 2006   a. remote stenting to ramus, prox LAD x2.  . Dental crowns present   . Diabetes mellitus   . First degree AV block   . GERD (gastroesophageal reflux disease)   . Heart attack ([place]) 06/2004  . Heart murmur    aortic  . Hx MRSA infection 02/2006  . Hyperlipidemia   . Hypertension    hx. of - has not been on med. since losing wt. after gastric bypass  . Hypothyroidism   . Morbid obesity ([place])   . Mucoid cyst of joint 09/2011   left thumb  . Sleep apnea    sleep study 03/29/2011; no CPAP use, lost >130lbs  . Trifascicular block     Family History  Problem Relation Age of Onset  .  Heart disease Mother     Past Surgical History:  Procedure Laterality Date  . AMPUTATION Left 04/19/2016   Procedure: Left Foot 4th Toe Amputation vs. Transmetatarsal;  Surgeon: [name], MD;  Location: [facility];  Service: Orthopedics;  Laterality: Left;  . BIOPSY  04/07/2019   Procedure: BIOPSY;  Surgeon: Otis Brace, MD;  Location: [facility];  Service: Gastroenterology;;  . CATARACT EXTRACTION  02/2010; 03/2010  . COLONOSCOPY WITH PROPOFOL N/A 09/14/2014   Procedure: COLONOSCOPY WITH PROPOFOL;  Surgeon: Garlan Fair, MD;  [facility];  Service: Endoscopy;  Laterality: N/A;  . COLONOSCOPY WITH PROPOFOL N/A 04/07/2019   Procedure: COLONOSCOPY WITH PROPOFOL;  Surgeon: Otis Brace, MD;  [facility];  Service: Gastroenterology;  Laterality: N/A;  . CORONARY ANGIOPLASTY WITH STENT PLACEMENT  07/12/2004; 08/03/2004   total of 3 stents  . I & D EXTREMITY Right 06/04/2014   Procedure: IRRIGATION AND DEBRIDEMENT EXTREMITY;  Surgeon: [name], MD;  Location: [facility];  Service: Orthopedics;  Laterality: Right;  . MASS EXCISION  10/04/2011   Procedure: EXCISION MASS;  Surgeon: Wynonia Sours, MD;  Location: [facility];  Service: Orthopedics;  Laterality: Left;  excision cyst debridment ip joint of left thumb  . PROSTATECTOMY    . right below knee amputation  2018  . ROUX-EN-Y GASTRIC BYPASS  10/10/2010   laparoscopic  . SHOULDER ARTHROSCOPY WITH ROTATOR CUFF REPAIR AND SUBACROMIAL DECOMPRESSION Right 03/23/2020   Procedure: RIGHT SHOULDER ARTHROSCOPY WITH DEBRIDEMENT AND  ROTATOR CUFF REPAIR;  Surgeon: [name], MD;  Location: [facility];  Service: Orthopedics;  Laterality: Right;  . TOE AMPUTATION  02/23/2006   left foot second ray amputation  . TOTAL HIP ARTHROPLASTY Right 03/29/2020   Procedure: TOTAL HIP ARTHROPLASTY POSTERIOR APPROACH;  Surgeon: [name], MD;  Location: [facility];  Service: Orthopedics;  Laterality: Right;  . TRANSMETATARSAL AMPUTATION Left   . TRIGGER FINGER RELEASE Right 09/16/2019   Procedure: RELEASE TRIGGER FINGER/A-1 PULLEY;  Surgeon: Daryll Brod, MD;  Location: [facility];  Service: Orthopedics;  Laterality: Right;  IV REGIONAL FOREARM BLOCK   Social History   Occupational History  . Occupation: Engineer, maintenance (IT) taxes  Tobacco Use  . Smoking status: Never Smoker  . Smokeless tobacco: Never Used  Substance and Sexual Activity  . Alcohol use: No  . Drug use: No  . Sexual activity: Yes

## 2020-05-20 DIAGNOSIS — B356 Tinea cruris: Secondary | ICD-10-CM | POA: Diagnosis not present

## 2020-05-24 ENCOUNTER — Encounter (HOSPITAL_BASED_OUTPATIENT_CLINIC_OR_DEPARTMENT_OTHER): Payer: Self-pay | Admitting: Physical Therapy

## 2020-05-24 ENCOUNTER — Ambulatory Visit (HOSPITAL_BASED_OUTPATIENT_CLINIC_OR_DEPARTMENT_OTHER): Payer: Medicare Other | Attending: Physician Assistant | Admitting: Physical Therapy

## 2020-05-24 ENCOUNTER — Other Ambulatory Visit: Payer: Self-pay

## 2020-05-24 DIAGNOSIS — R600 Localized edema: Secondary | ICD-10-CM

## 2020-05-24 DIAGNOSIS — M25611 Stiffness of right shoulder, not elsewhere classified: Secondary | ICD-10-CM

## 2020-05-24 DIAGNOSIS — M25511 Pain in right shoulder: Secondary | ICD-10-CM | POA: Diagnosis not present

## 2020-05-24 DIAGNOSIS — R293 Abnormal posture: Secondary | ICD-10-CM | POA: Diagnosis not present

## 2020-05-24 DIAGNOSIS — M6281 Muscle weakness (generalized): Secondary | ICD-10-CM

## 2020-05-24 NOTE — Therapy (Signed)
[facility] [address] [place], [place], [address] Phone: [phone]   Fax:  [phone]  Physical Therapy Treatment  Patient Details  Name: [name] MRN: [id number] Date of Birth: [birth year] Referring Provider (PT): Benita Stabile, [place]   Encounter Date: 05/24/2020   PT End of Session - 05/24/20 0936    Visit Number 3    Number of Visits 17    Date for PT Re-Evaluation 07/09/20    Authorization Type Medicare & BCBS    PT Start Time 0930    PT Stop Time 1012    PT Time Calculation (min) 42 min    Activity Tolerance Patient tolerated treatment well    Behavior During Therapy [facility] for tasks assessed/performed           Past Medical History:  Diagnosis Date  . Anemia    low iron  . Arthritis   . Coronary artery disease 2006   a. remote stenting to ramus, prox LAD x2.  . Dental crowns present   . Diabetes mellitus   . First degree AV block   . GERD (gastroesophageal reflux disease)   . Heart attack ([place]) 06/2004  . Heart murmur    aortic  . Hx MRSA infection 02/2006  . Hyperlipidemia   . Hypertension    hx. of - has not been on med. since losing wt. after gastric bypass  . Hypothyroidism   . Morbid obesity ([place])   . Mucoid cyst of joint 09/2011   left thumb  . Sleep apnea    sleep study 03/29/2011; no CPAP use, lost >130lbs  . Trifascicular block     Past Surgical History:  Procedure Laterality Date  . AMPUTATION Left 04/19/2016   Procedure: Left Foot 4th Toe Amputation vs. Transmetatarsal;  Surgeon: [name], MD;  Location: [facility] [place];  Service: Orthopedics;  Laterality: Left;  . BIOPSY  04/07/2019   Procedure: BIOPSY;  Surgeon: Otis Brace, MD;  [facility];  Service: Gastroenterology;;  . CATARACT EXTRACTION  02/2010; 03/2010  . COLONOSCOPY WITH PROPOFOL N/A 09/14/2014   Procedure: COLONOSCOPY WITH PROPOFOL;  Surgeon: Garlan Fair, MD;  [facility];  Service: Endoscopy;  Laterality:  N/A;  . COLONOSCOPY WITH PROPOFOL N/A 04/07/2019   Procedure: COLONOSCOPY WITH PROPOFOL;  Surgeon: Otis Brace, MD;  [facility];  Service: Gastroenterology;  Laterality: N/A;  . CORONARY ANGIOPLASTY WITH STENT PLACEMENT  07/12/2004; 08/03/2004   total of 3 stents  . I & D EXTREMITY Right 06/04/2014   Procedure: IRRIGATION AND DEBRIDEMENT EXTREMITY;  Surgeon: [name], MD;  Location: [facility];  Service: Orthopedics;  Laterality: Right;  . MASS EXCISION  10/04/2011   Procedure: EXCISION MASS;  Surgeon: Wynonia Sours, MD;  Location: [facility];  Service: Orthopedics;  Laterality: Left;  excision cyst debridment ip joint of left thumb  . PROSTATECTOMY    . right below knee amputation  2018  . ROUX-EN-Y GASTRIC BYPASS  10/10/2010   laparoscopic  . SHOULDER ARTHROSCOPY WITH ROTATOR CUFF REPAIR AND SUBACROMIAL DECOMPRESSION Right 03/23/2020   Procedure: RIGHT SHOULDER ARTHROSCOPY WITH DEBRIDEMENT AND  ROTATOR CUFF REPAIR;  Surgeon: [name], MD;  Location: [facility];  Service: Orthopedics;  Laterality: Right;  . TOE AMPUTATION  02/23/2006   left foot second ray amputation  . TOTAL HIP ARTHROPLASTY Right 03/29/2020   Procedure: TOTAL HIP ARTHROPLASTY POSTERIOR APPROACH;  Surgeon: [name], MD;  Location: [place];  Service: Orthopedics;  Laterality: Right;  . TRANSMETATARSAL AMPUTATION Left   . TRIGGER FINGER RELEASE Right 09/16/2019   Procedure: RELEASE TRIGGER FINGER/A-1 PULLEY;  Surgeon: Daryll Brod, MD;  Location: [place];  Service: Orthopedics;  Laterality: Right;  IV REGIONAL FOREARM BLOCK    There were no vitals filed for this visit.   Subjective Assessment - 05/24/20 0933    Subjective Patient has been to the MD who wants him to just walk and perfrom his home exercises. His shoulder is doing well.    Patient is accompained by: Family member    Pertinent History right TTA, right shoulder arthroscopy, right THA. CHF, CAD, PVD,  DM2, MI    Patient Stated Goals to use right shoulder & hip to return to high level of function with his prosthesis.    Currently in Pain? Yes    Pain Score 2     Pain Location Shoulder    Pain Orientation Right    Pain Descriptors / Indicators Aching    Pain Type Chronic pain    Pain Onset More than a month ago    Pain Frequency Intermittent    Aggravating Factors  hurts at night    Pain Relieving Factors tylenol    Multiple Pain Sites No                             [facility] Adult PT Treatment/Exercise - 05/24/20 0001      Knee/Hip Exercises: Sidelying   Other Sidelying Knee/Hip Exercises sidelying er 2x10 no weight towel under arm      Shoulder Exercises: Supine   External Rotation AAROM;Right;15 reps    Flexion AAROM;Right;15 reps    Other Supine Exercises wand flexion 1lb 2x10      Shoulder Exercises: Seated   Extension Limitations 2x10 yellow    Retraction Limitations yellow band 2x20      Shoulder Exercises: Sidelying   Other Sidelying Exercises side lying er 2x10      Shoulder Exercises: Standing   Other Standing Exercises seated scap retraction 2x10 yellow; shoulder extension 2x10 yellow      Shoulder Exercises: Pulleys   Flexion 3 minutes    ABduction 3 minutes                  PT Education - 05/24/20 0936    Education Details reviewed HEp and symptom mangement    Person(s) Educated Patient    Methods Explanation;Demonstration;Tactile cues;Verbal cues    Comprehension Verbalized understanding;Returned demonstration;Verbal cues required            PT Short Term Goals - 05/18/20 1612      PT SHORT TERM GOAL #1   Title Patient demonstrates understanding of HEP    Time 4    Period Weeks    Status New    Target Date 06/11/20      PT SHORT TERM GOAL #2   Title right shoulder PROM flexion 120*, abduction 100* and Ext Rot 45*    Time 4    Period Weeks    Status New    Target Date 06/11/20             PT Long Term Goals -  05/18/20 1607      PT LONG TERM GOAL #1   Title Patient FOTO score 67%    Time 8    Period Weeks    Status New    Target Date 07/09/20  PT LONG TERM GOAL #2   Title Right shoulder PROM within normal limits    Time 8    Period Weeks    Status New    Target Date 07/09/20      PT LONG TERM GOAL #3   Title Right shoulder strength 5/5    Time 8    Period Weeks    Status New    Target Date 07/09/20      PT LONG TERM GOAL #4   Title Patient reports right shoulder pain 0/10    Time 8    Period Weeks    Status New    Target Date 07/09/20                 Plan - 05/24/20 1205    Clinical Impression Statement Patient is doing well. is flexion range is limited per visual inspection but, he is having very little pain. He tolerated ther-ex well with only a minor increase in pain. Therapy added resistance to scap strengthening and added in sidelying er. He had no significant pain with any of those activity. Scap strengthening perfromed in sitting 2nd to hip replacement. We will continue to progress as tolerated.    Personal Factors and Comorbidities Age;Comorbidity 3+    Comorbidities right TTA, right shoulder arthroscopy, right THA. CHF, CAD, PVD, DM2, MI    Examination-Activity Limitations Carry;Lift;Locomotion Level;Reach Overhead    Examination-Participation Restrictions Community Activity    Stability/Clinical Decision Making Evolving/Moderate complexity    Clinical Decision Making Low    Rehab Potential Good    PT Frequency 2x / week    PT Treatment/Interventions ADLs/Self Care Home Management;Cryotherapy;Electrical Stimulation;Ultrasound;Therapeutic activities;Therapeutic exercise;Neuromuscular re-education;Patient/family education;Manual techniques;Passive range of motion;Dry needling;Vasopneumatic Device;Joint Manipulations    PT Next Visit Plan update HEP, PROM & manual therapy to increase shoulder range, assess & update plan of care to include hip when Dr. [name] refers     Consulted and Agree with Plan of Care Patient;Family member/caregiver           Patient will benefit from skilled therapeutic intervention in order to improve the following deficits and impairments:  Decreased strength,Decreased mobility,Decreased range of motion,Increased edema,Impaired flexibility,Postural dysfunction,Pain  Visit Diagnosis: Acute pain of right shoulder  Stiffness of right shoulder, not elsewhere classified  Muscle weakness (generalized)  Abnormal posture  Localized edema     Problem List Patient Active Problem List   Diagnosis Date Noted  . Closed fracture of neck of right femur ([place])   . Hip fracture ([place]) 03/27/2020  . Nontraumatic complete tear of right rotator cuff 02/26/2020  . S/P transmetatarsal amputation of foot, left ([place]) 04/25/2016  . Chronic osteomyelitis of toe, left ([place])   . Cellulitis of toe of left foot   . Chest pain 09/25/2015  . CAD (coronary artery disease) of artery bypass graft 09/25/2015  . Sinus bradycardia on ECG 09/25/2015  . Chronic diastolic CHF (congestive heart failure) ([place]) 09/25/2015  . Chest pain at rest 09/25/2015  . Aortic valvular disorder 08/04/2014  . Disorder of mitral valve 08/04/2014  . Diabetic foot infection ([place]) 06/03/2014  . Peripheral vascular disease ([place]) 06/03/2014  . Abscess of foot 06/03/2014  . CAD (coronary artery disease) 07/29/2013  . Trifascicular block 07/29/2013  . 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 Hypothyroidism 01/22/2007  . Type 2 diabetes mellitus with vascular disease ([place]) 01/22/2007  . HYPERLIPIDEMIA, MIXED 01/22/2007  . MYOCARDIAL INFARCTION, HX OF 01/22/2007  . DEGENERATIVE JOINT DISEASE 01/22/2007  .  ANEMIA, IRON DEFICIENCY, HX OF 01/22/2007    Carney Living PT DPT  05/24/2020, 12:11 PM  [facility] [address] [place], [place], [address] Phone: [phone]   Fax:  [phone]  Name: [name] MRN: [id number] Date of Birth: 11-Feb-1950

## 2020-05-28 ENCOUNTER — Encounter (HOSPITAL_BASED_OUTPATIENT_CLINIC_OR_DEPARTMENT_OTHER): Payer: Self-pay | Admitting: Physical Therapy

## 2020-05-28 ENCOUNTER — Ambulatory Visit (HOSPITAL_BASED_OUTPATIENT_CLINIC_OR_DEPARTMENT_OTHER): Payer: Medicare Other | Admitting: Physical Therapy

## 2020-05-28 ENCOUNTER — Other Ambulatory Visit: Payer: Self-pay

## 2020-05-28 DIAGNOSIS — M25611 Stiffness of right shoulder, not elsewhere classified: Secondary | ICD-10-CM | POA: Diagnosis not present

## 2020-05-28 DIAGNOSIS — R293 Abnormal posture: Secondary | ICD-10-CM

## 2020-05-28 DIAGNOSIS — R600 Localized edema: Secondary | ICD-10-CM | POA: Diagnosis not present

## 2020-05-28 DIAGNOSIS — M6281 Muscle weakness (generalized): Secondary | ICD-10-CM

## 2020-05-28 DIAGNOSIS — M25511 Pain in right shoulder: Secondary | ICD-10-CM | POA: Diagnosis not present

## 2020-05-28 NOTE — Therapy (Signed)
[facility] [address] [place], [place], [address] Phone: [phone]   Fax:  [phone]  Physical Therapy Treatment  Patient Details  Name: Lillian Tigges MRN: [id number] Date of Birth: [birth year] Referring Provider (PT): Benita Stabile, [place]   Encounter Date: 05/28/2020   PT End of Session - 05/28/20 4259    Visit Number 4    Number of Visits 17    Date for PT Re-Evaluation 07/09/20    Authorization Type Medicare & BCBS    PT Start Time 0845    PT Stop Time 0925    PT Time Calculation (min) 40 min           Past Medical History:  Diagnosis Date  . Anemia    low iron  . Arthritis   . Coronary artery disease 2006   a. remote stenting to ramus, prox LAD x2.  . Dental crowns present   . Diabetes mellitus   . First degree AV block   . GERD (gastroesophageal reflux disease)   . Heart attack ([place]) 06/2004  . Heart murmur    aortic  . Hx MRSA infection 02/2006  . Hyperlipidemia   . Hypertension    hx. of - has not been on med. since losing wt. after gastric bypass  . Hypothyroidism   . Morbid obesity ([place])   . Mucoid cyst of joint 09/2011   left thumb  . Sleep apnea    sleep study 03/29/2011; no CPAP use, lost >130lbs  . Trifascicular block     Past Surgical History:  Procedure Laterality Date  . AMPUTATION Left 04/19/2016   Procedure: Left Foot 4th Toe Amputation vs. Transmetatarsal;  Surgeon: [name], MD;  Location: [facility];  Service: Orthopedics;  Laterality: Left;  . BIOPSY  04/07/2019   Procedure: BIOPSY;  Surgeon: Otis Brace, MD;  [facility];  Service: Gastroenterology;;  . CATARACT EXTRACTION  02/2010; 03/2010  . COLONOSCOPY WITH PROPOFOL N/A 09/14/2014   Procedure: COLONOSCOPY WITH PROPOFOL;  Surgeon: Garlan Fair, MD;  [facility];  Service: Endoscopy;  Laterality: N/A;  . COLONOSCOPY WITH PROPOFOL N/A 04/07/2019   Procedure: COLONOSCOPY WITH PROPOFOL;  Surgeon: Otis Brace,  MD;  [facility];  Service: Gastroenterology;  Laterality: N/A;  . CORONARY ANGIOPLASTY WITH STENT PLACEMENT  07/12/2004; 08/03/2004   total of 3 stents  . I & D EXTREMITY Right 06/04/2014   Procedure: IRRIGATION AND DEBRIDEMENT EXTREMITY;  Surgeon: [name], MD;  Location: [facility];  Service: Orthopedics;  Laterality: Right;  . MASS EXCISION  10/04/2011   Procedure: EXCISION MASS;  Surgeon: Wynonia Sours, MD;  Location: [facility];  Service: Orthopedics;  Laterality: Left;  excision cyst debridment ip joint of left thumb  . PROSTATECTOMY    . right below knee amputation  2018  . ROUX-EN-Y GASTRIC BYPASS  10/10/2010   laparoscopic  . SHOULDER ARTHROSCOPY WITH ROTATOR CUFF REPAIR AND SUBACROMIAL DECOMPRESSION Right 03/23/2020   Procedure: RIGHT SHOULDER ARTHROSCOPY WITH DEBRIDEMENT AND  ROTATOR CUFF REPAIR;  Surgeon: [name], MD;  Location: [facility];  Service: Orthopedics;  Laterality: Right;  . TOE AMPUTATION  02/23/2006   left foot second ray amputation  . TOTAL HIP ARTHROPLASTY Right 03/29/2020   Procedure: TOTAL HIP ARTHROPLASTY POSTERIOR APPROACH;  Surgeon: [name], MD;  Location: [facility];  Service: Orthopedics;  Laterality: Right;  . TRANSMETATARSAL AMPUTATION Left   . TRIGGER FINGER RELEASE Right 09/16/2019   Procedure:  RELEASE TRIGGER FINGER/A-1 PULLEY;  Surgeon: Daryll Brod, MD;  Location: [facility];  Service: Orthopedics;  Laterality: Right;  IV REGIONAL FOREARM BLOCK    There were no vitals filed for this visit.   Subjective Assessment - 05/28/20 0911    Subjective Patient reports it has been a little sore in certain positions but overall has been feeling good. He was a little sore after the last visit.    Pertinent History right TTA, right shoulder arthroscopy, right THA. CHF, CAD, PVD, DM2, MI    Patient Stated Goals to use right shoulder & hip to return to high level of function with his prosthesis.    Currently in  Pain? Yes    Pain Score 2     Pain Location Shoulder    Pain Orientation Right    Pain Descriptors / Indicators Aching    Pain Type Chronic pain    Pain Onset More than a month ago    Pain Frequency Intermittent    Aggravating Factors  hurts with certain movement    Pain Relieving Factors tyenol    Multiple Pain Sites No                             [facility] Adult PT Treatment/Exercise - 05/28/20 0001      Knee/Hip Exercises: Sidelying   Other Sidelying Knee/Hip Exercises sidelying er 2x10 no weight towel under arm      Shoulder Exercises: Supine   Flexion AAROM;Right;15 reps    Shoulder Flexion Weight (lbs) 1 lb weight cane      Shoulder Exercises: Seated   Extension Limitations 2x10 yellow    Retraction Limitations yellow band 2x20    Other Seated Exercises finger ladder 5 laps    Other Seated Exercises chest press 2x10      Manual Therapy   Manual therapy comments Rt shoulder Grade 2 GH mobs for distraction, A-P, inferior mobs low grade , Rt shoulder PROM all planes to tolerance                  PT Education - 05/28/20 0919    Education Details reviewed progression of activity into standing closed chain standing flexion (finger ladder)    Person(s) Educated Patient    Methods Explanation;Demonstration;Tactile cues;Verbal cues    Comprehension Verbalized understanding;Returned demonstration;Verbal cues required;Tactile cues required            PT Short Term Goals - 05/18/20 1612      PT SHORT TERM GOAL #1   Title Patient demonstrates understanding of HEP    Time 4    Period Weeks    Status New    Target Date 06/11/20      PT SHORT TERM GOAL #2   Title right shoulder PROM flexion 120*, abduction 100* and Ext Rot 45*    Time 4    Period Weeks    Status New    Target Date 06/11/20             PT Long Term Goals - 05/18/20 1607      PT LONG TERM GOAL #1   Title Patient FOTO score 67%    Time 8    Period Weeks    Status New     Target Date 07/09/20      PT LONG TERM GOAL #2   Title Right shoulder PROM within normal limits    Time 8  Period Weeks    Status New    Target Date 07/09/20      PT LONG TERM GOAL #3   Title Right shoulder strength 5/5    Time 8    Period Weeks    Status New    Target Date 07/09/20      PT LONG TERM GOAL #4   Title Patient reports right shoulder pain 0/10    Time 8    Period Weeks    Status New    Target Date 07/09/20                 Plan - 05/28/20 0926    Clinical Impression Statement Patient is progressing wel. per visual inspection his shoulder flexion has improved. With active assisted shoulder flexion he had mild popping in his shoulder Therapy had him adjust his grip and the popping went away. He began the finger ladder today. he was given band exercises for scap strengthening for home. He tolerated well. He demonstrated good technique with ther-ex. We will continue to advance as tolerated. Therapy updated his HEP.    Personal Factors and Comorbidities Age;Comorbidity 3+    Comorbidities right TTA, right shoulder arthroscopy, right THA. CHF, CAD, PVD, DM2, MI    Examination-Activity Limitations Carry;Lift;Locomotion Level;Reach Overhead    Examination-Participation Restrictions Community Activity    Stability/Clinical Decision Making Evolving/Moderate complexity    Clinical Decision Making Low    Rehab Potential Good    PT Frequency 2x / week    PT Duration 8 weeks    PT Treatment/Interventions ADLs/Self Care Home Management;Cryotherapy;Electrical Stimulation;Ultrasound;Therapeutic activities;Therapeutic exercise;Neuromuscular re-education;Patient/family education;Manual techniques;Passive range of motion;Dry needling;Vasopneumatic Device;Joint Manipulations    PT Next Visit Plan update HEP, PROM & manual therapy to increase shoulder range, assess & update plan of care to include hip when Dr. [name] refers    PT Home Exercise Plan Access Code: [email or web address]  URL:  [email or web address]  Date: 05/28/2020  Prepared by: Carolyne Littles    Exercises  Supine Scapular Retraction - 1 x daily - 7 x weekly - 2 sets - 10 reps - 5 seconds hold  Standing Scapular Depression - 1 x daily - 7 x weekly - 2 sets - 10 reps - 5 seconds hold  Supine Shoulder Flexion AAROM with Hands Clasped - 3 x daily - 7 x weekly - 2 sets - 10 reps - 10 seconds hold  Supine Shoulder External Rotation in 45 Degrees Abduction AAROM with Dowel - 3 x daily - 7 x weekly - 2 sets - 10 reps - 10 seconds hold  Sidelying Shoulder External Rotation - 1 x daily - 7 x weekly - 3 sets - 10 reps  Shoulder extension with resistance - Neutral - 1 x daily - 7 x weekly - 3 sets - 10 reps  Scapular Retraction with Resistance - 1 x daily - 7 x weekly - 3 sets - 10 reps  advised to do banded restractions if tolerated.    Consulted and Agree with Plan of Care Patient;Family member/caregiver    Family Member Consulted --           Patient will benefit from skilled therapeutic intervention in order to improve the following deficits and impairments:     Visit Diagnosis: Acute pain of right shoulder  Stiffness of right shoulder, not elsewhere classified  Muscle weakness (generalized)  Abnormal posture  Localized edema     Problem List Patient Active Problem List   Diagnosis Date Noted  . Closed  fracture of neck of right femur ([place])   . Hip fracture ([place]) 03/27/2020  . Nontraumatic complete tear of right rotator cuff 02/26/2020  . S/P transmetatarsal amputation of foot, left ([place]) 04/25/2016  . Chronic osteomyelitis of toe, left ([place])   . Cellulitis of toe of left foot   . Chest pain 09/25/2015  . CAD (coronary artery disease) of artery bypass graft 09/25/2015  . Sinus bradycardia on ECG 09/25/2015  . Chronic diastolic CHF (congestive heart failure) ([place]) 09/25/2015  . Chest pain at rest 09/25/2015  . Aortic valvular disorder 08/04/2014  . Disorder of mitral valve 08/04/2014  .  Diabetic foot infection ([place]) 06/03/2014  . Peripheral vascular disease ([place]) 06/03/2014  . Abscess of foot 06/03/2014  . CAD (coronary artery disease) 07/29/2013  . Trifascicular block 07/29/2013  . 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 Hypothyroidism 01/22/2007  . Type 2 diabetes mellitus with vascular disease ([place]) 01/22/2007  . HYPERLIPIDEMIA, MIXED 01/22/2007  . MYOCARDIAL INFARCTION, HX OF 01/22/2007  . DEGENERATIVE JOINT DISEASE 01/22/2007  . ANEMIA, IRON DEFICIENCY, HX OF 01/22/2007    Carney Living PT DPT  05/28/2020, 9:55 AM  [facility] [address] [place], [place], [address] Phone: [phone]   Fax:  [phone]  Name: Natthew Marlatt MRN: [id number] Date of Birth: 12-07-1950

## 2020-06-01 ENCOUNTER — Ambulatory Visit (INDEPENDENT_AMBULATORY_CARE_PROVIDER_SITE_OTHER): Payer: Medicare Other | Admitting: Physical Therapy

## 2020-06-01 ENCOUNTER — Encounter: Payer: Self-pay | Admitting: Physical Therapy

## 2020-06-01 ENCOUNTER — Other Ambulatory Visit: Payer: Self-pay

## 2020-06-01 DIAGNOSIS — R293 Abnormal posture: Secondary | ICD-10-CM

## 2020-06-01 DIAGNOSIS — M25511 Pain in right shoulder: Secondary | ICD-10-CM

## 2020-06-01 DIAGNOSIS — R600 Localized edema: Secondary | ICD-10-CM

## 2020-06-01 DIAGNOSIS — M25611 Stiffness of right shoulder, not elsewhere classified: Secondary | ICD-10-CM | POA: Diagnosis not present

## 2020-06-01 DIAGNOSIS — M6281 Muscle weakness (generalized): Secondary | ICD-10-CM

## 2020-06-01 NOTE — Therapy (Signed)
[facility] Physical Therapy [address] [place], [place], [address] Phone: [phone]   Fax:  [phone]  Physical Therapy Treatment  Patient Details  Name: [name] MRN: [id number] Date of Birth: 1950-12-31 Referring Provider (PT): Benita Stabile, [place]   Encounter Date: 06/01/2020   PT End of Session - 06/01/20 1143    Visit Number 5    Number of Visits 17    Date for PT Re-Evaluation 07/09/20    Authorization Type Medicare & BCBS    Progress Note Due on Visit 10    PT Start Time 1100    PT Stop Time 1138    PT Time Calculation (min) 38 min    Activity Tolerance Patient tolerated treatment well    Behavior During Therapy [facility] for tasks assessed/performed           Past Medical History:  Diagnosis Date  . Anemia    low iron  . Arthritis   . Coronary artery disease 2006   a. remote stenting to ramus, prox LAD x2.  . Dental crowns present   . Diabetes mellitus   . First degree AV block   . GERD (gastroesophageal reflux disease)   . Heart attack ([place]) 06/2004  . Heart murmur    aortic  . Hx MRSA infection 02/2006  . Hyperlipidemia   . Hypertension    hx. of - has not been on med. since losing wt. after gastric bypass  . Hypothyroidism   . Morbid obesity ([place])   . Mucoid cyst of joint 09/2011   left thumb  . Sleep apnea    sleep study 03/29/2011; no CPAP use, lost >130lbs  . Trifascicular block     Past Surgical History:  Procedure Laterality Date  . AMPUTATION Left 04/19/2016   Procedure: Left Foot 4th Toe Amputation vs. Transmetatarsal;  Surgeon: [name], MD;  Location: [facility];  Service: Orthopedics;  Laterality: Left;  . BIOPSY  04/07/2019   Procedure: BIOPSY;  Surgeon: Otis Brace, MD;  [facility];  Service: Gastroenterology;;  . CATARACT EXTRACTION  02/2010; 03/2010  . COLONOSCOPY WITH PROPOFOL N/A 09/14/2014   Procedure: COLONOSCOPY WITH PROPOFOL;  Surgeon: Garlan Fair, MD;  [facility];  Service:  Endoscopy;  Laterality: N/A;  . COLONOSCOPY WITH PROPOFOL N/A 04/07/2019   Procedure: COLONOSCOPY WITH PROPOFOL;  Surgeon: Otis Brace, MD;  [facility];  Service: Gastroenterology;  Laterality: N/A;  . CORONARY ANGIOPLASTY WITH STENT PLACEMENT  07/12/2004; 08/03/2004   total of 3 stents  . I & D EXTREMITY Right 06/04/2014   Procedure: IRRIGATION AND DEBRIDEMENT EXTREMITY;  Surgeon: [name], MD;  Location: [facility];  Service: Orthopedics;  Laterality: Right;  . MASS EXCISION  10/04/2011   Procedure: EXCISION MASS;  Surgeon: Wynonia Sours, MD;  Location: [facility];  Service: Orthopedics;  Laterality: Left;  excision cyst debridment ip joint of left thumb  . PROSTATECTOMY    . right below knee amputation  2018  . ROUX-EN-Y GASTRIC BYPASS  10/10/2010   laparoscopic  . SHOULDER ARTHROSCOPY WITH ROTATOR CUFF REPAIR AND SUBACROMIAL DECOMPRESSION Right 03/23/2020   Procedure: RIGHT SHOULDER ARTHROSCOPY WITH DEBRIDEMENT AND  ROTATOR CUFF REPAIR;  Surgeon: [name], MD;  Location: [facility];  Service: Orthopedics;  Laterality: Right;  . TOE AMPUTATION  02/23/2006   left foot second ray amputation  . TOTAL HIP ARTHROPLASTY Right 03/29/2020   Procedure: TOTAL HIP ARTHROPLASTY POSTERIOR APPROACH;  Surgeon: [name], MD;  Location: [facility];  Service: Orthopedics;  Laterality: Right;  . TRANSMETATARSAL AMPUTATION Left   . TRIGGER FINGER RELEASE Right 09/16/2019   Procedure: RELEASE TRIGGER FINGER/A-1 PULLEY;  Surgeon: Daryll Brod, MD;  Location: [facility];  Service: Orthopedics;  Laterality: Right;  IV REGIONAL FOREARM BLOCK    There were no vitals filed for this visit.   Subjective Assessment - 06/01/20 1126    Subjective Pt arriving reporting soreness in his right shoulder. Pt reporting compliance in his HEP.    Pertinent History right TTA, right shoulder arthroscopy, right THA. CHF, CAD, PVD, DM2, MI    Patient Stated Goals to use right  shoulder & hip to return to high level of function with his prosthesis.    Currently in Pain? Yes    Pain Score 1     Pain Location Shoulder    Pain Orientation Right    Pain Descriptors / Indicators Sore    Pain Type Chronic pain    Pain Onset More than a month ago              [facility] PT Assessment - 06/01/20 0001      Assessment   Medical Diagnosis Right shoulder RTC repair    Referring Provider (PT) Benita Stabile, PA    Onset Date/Surgical Date 03/23/20    Hand Dominance Right      PROM   Overall PROM Comments measurements taken in supine    Right Shoulder Flexion 140 Degrees   supine   Right Shoulder ABduction 100 Degrees    Right Shoulder Internal Rotation 70 Degrees    Right Shoulder External Rotation 60 Degrees                         [facility] Adult PT Treatment/Exercise - 06/01/20 0001      Knee/Hip Exercises: Sidelying   Other Sidelying Knee/Hip Exercises sidelying er 2x10 no weight towel under arm      Shoulder Exercises: Supine   Flexion AAROM;Right;15 reps    Shoulder Flexion Weight (lbs) 1# bar      Shoulder Exercises: Seated   Retraction Limitations yellow band 2x20    Other Seated Exercises finger ladder 5 laps    Other Seated Exercises chest press 2x10      Shoulder Exercises: Sidelying   Other Sidelying Exercises ER x20      Manual Therapy   Manual therapy comments Grade 2 GH mobs, right shoulder,  A-P mobs, PROM in all planes to pt's tolerance                    PT Short Term Goals - 06/01/20 1149      PT SHORT TERM GOAL #1   Title Patient demonstrates understanding of HEP    Status On-going      PT SHORT TERM GOAL #2   Title right shoulder PROM flexion 120*, abduction 100* and Ext Rot 45*    Baseline meet see flowsheets    Status Achieved             PT Long Term Goals - 05/18/20 1607      PT LONG TERM GOAL #1   Title Patient FOTO score 67%    Time 8    Period Weeks    Status New    Target Date 07/09/20       PT LONG TERM GOAL #2   Title Right shoulder PROM within normal limits  Time 8    Period Weeks    Status New    Target Date 07/09/20      PT LONG TERM GOAL #3   Title Right shoulder strength 5/5    Time 8    Period Weeks    Status New    Target Date 07/09/20      PT LONG TERM GOAL #4   Title Patient reports right shoulder pain 0/10    Time 8    Period Weeks    Status New    Target Date 07/09/20                 Plan - 06/01/20 1143    Clinical Impression Statement Pt tolerating exercises well with improvements in PROM. Mild popping noted with active assisted ROM with  flexion with shoulders mildly abducted. Adjustment made in positioning and popping was eliminated. Pt tolerating scapular strengtheing with HEP and demonstrating good exercise techniques with mild cuing. Continue skilled PT proressing toward goals set.    Personal Factors and Comorbidities Age;Comorbidity 3+    Comorbidities right TTA, right shoulder arthroscopy, right THA. CHF, CAD, PVD, DM2, MI    Examination-Activity Limitations Carry;Lift;Locomotion Level;Reach Overhead    Examination-Participation Restrictions Community Activity    Stability/Clinical Decision Making Evolving/Moderate complexity    Rehab Potential Good    PT Frequency 2x / week    PT Duration 8 weeks    PT Treatment/Interventions ADLs/Self Care Home Management;Cryotherapy;Electrical Stimulation;Ultrasound;Therapeutic activities;Therapeutic exercise;Neuromuscular re-education;Patient/family education;Manual techniques;Passive range of motion;Dry needling;Vasopneumatic Device;Joint Manipulations    PT Next Visit Plan update HEP, PROM & manual therapy to increase shoulder range, assess & update plan of care to include hip when Dr. [name] refers    PT Home Exercise Plan Access Code: [email or web address]  URL: [email or web address]  Date: 05/28/2020  Prepared by: Carolyne Littles    Exercises  Supine Scapular Retraction - 1 x daily - 7 x weekly -  2 sets - 10 reps - 5 seconds hold  Standing Scapular Depression - 1 x daily - 7 x weekly - 2 sets - 10 reps - 5 seconds hold  Supine Shoulder Flexion AAROM with Hands Clasped - 3 x daily - 7 x weekly - 2 sets - 10 reps - 10 seconds hold  Supine Shoulder External Rotation in 45 Degrees Abduction AAROM with Dowel - 3 x daily - 7 x weekly - 2 sets - 10 reps - 10 seconds hold  Sidelying Shoulder External Rotation - 1 x daily - 7 x weekly - 3 sets - 10 reps  Shoulder extension with resistance - Neutral - 1 x daily - 7 x weekly - 3 sets - 10 reps  Scapular Retraction with Resistance - 1 x daily - 7 x weekly - 3 sets - 10 reps  advised to do banded restractions if tolerated.    Consulted and Agree with Plan of Care Patient;Family member/caregiver           Patient will benefit from skilled therapeutic intervention in order to improve the following deficits and impairments:  Decreased strength,Decreased mobility,Decreased range of motion,Increased edema,Impaired flexibility,Postural dysfunction,Pain  Visit Diagnosis: Acute pain of right shoulder  Stiffness of right shoulder, not elsewhere classified  Muscle weakness (generalized)  Abnormal posture  Localized edema     Problem List Patient Active Problem List   Diagnosis Date Noted  . Closed fracture of neck of right femur ([place])   . Hip fracture ([place]) 03/27/2020  . Nontraumatic complete tear of right rotator  cuff 02/26/2020  . S/P transmetatarsal amputation of foot, left ([place]) 04/25/2016  . Chronic osteomyelitis of toe, left ([place])   . Cellulitis of toe of left foot   . Chest pain 09/25/2015  . CAD (coronary artery disease) of artery bypass graft 09/25/2015  . Sinus bradycardia on ECG 09/25/2015  . Chronic diastolic CHF (congestive heart failure) ([place]) 09/25/2015  . Chest pain at rest 09/25/2015  . Aortic valvular disorder 08/04/2014  . Disorder of mitral valve 08/04/2014  . Diabetic foot infection ([place]) 06/03/2014  . Peripheral  vascular disease ([place]) 06/03/2014  . Abscess of foot 06/03/2014  . CAD (coronary artery disease) 07/29/2013  . Trifascicular block 07/29/2013  . 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 Hypothyroidism 01/22/2007  . Type 2 diabetes mellitus with vascular disease ([place]) 01/22/2007  . HYPERLIPIDEMIA, MIXED 01/22/2007  . MYOCARDIAL INFARCTION, HX OF 01/22/2007  . DEGENERATIVE JOINT DISEASE 01/22/2007  . ANEMIA, IRON DEFICIENCY, HX OF 01/22/2007    Oretha Caprice, PT, MPT 06/01/2020, 11:50 AM  [facility] Physical Therapy [address] [place], [place], [address] Phone: [phone]   Fax:  [phone]  Name: [name] MRN: [id number] Date of Birth: 04/21/1950

## 2020-06-02 ENCOUNTER — Encounter: Payer: Self-pay | Admitting: Physician Assistant

## 2020-06-02 ENCOUNTER — Ambulatory Visit (INDEPENDENT_AMBULATORY_CARE_PROVIDER_SITE_OTHER): Payer: Medicare Other | Admitting: Physician Assistant

## 2020-06-02 DIAGNOSIS — Z9889 Other specified postprocedural states: Secondary | ICD-10-CM | POA: Insufficient documentation

## 2020-06-02 NOTE — Progress Notes (Signed)
HPI: Mr. Kampa returns today status post right shoulder arthroscopy, with rotator cuff repair.  He has been going to physical therapy.  He feels like his strength and range of motion are improving.  He has no concerns or complaints otherwise.  Physical exam: Right shoulder forward flexion to approximately 120 degrees.  He has good strength and able to resist external rotation and internal external.  Impression: Status post right shoulder arthroscopy with rotator cuff repair 03/23/2020  Plan: He will continue to work on range of motion strengthening of the shoulder.  We will see him back in just 3 weeks see what type of provement he has had.  Questions were encouraged and answered at length today.

## 2020-06-03 ENCOUNTER — Other Ambulatory Visit: Payer: Self-pay

## 2020-06-03 ENCOUNTER — Ambulatory Visit (INDEPENDENT_AMBULATORY_CARE_PROVIDER_SITE_OTHER): Payer: Medicare Other | Admitting: Physical Therapy

## 2020-06-03 DIAGNOSIS — R600 Localized edema: Secondary | ICD-10-CM | POA: Diagnosis not present

## 2020-06-03 DIAGNOSIS — M25611 Stiffness of right shoulder, not elsewhere classified: Secondary | ICD-10-CM | POA: Diagnosis not present

## 2020-06-03 DIAGNOSIS — M6281 Muscle weakness (generalized): Secondary | ICD-10-CM | POA: Diagnosis not present

## 2020-06-03 DIAGNOSIS — R293 Abnormal posture: Secondary | ICD-10-CM | POA: Diagnosis not present

## 2020-06-03 DIAGNOSIS — M25511 Pain in right shoulder: Secondary | ICD-10-CM

## 2020-06-03 NOTE — Therapy (Signed)
[facility] Physical Therapy [address] [place], [place], [address] Phone: [phone]   Fax:  [phone]  Physical Therapy Treatment  Patient Details  Name: [name] MRN: [id number] Date of Birth: Jun 08, 1950 Referring Provider (PT): Benita Stabile, [place]   Encounter Date: 06/03/2020   PT End of Session - 06/03/20 1144    Visit Number 6    Number of Visits 17    Date for PT Re-Evaluation 07/09/20    Authorization Type Medicare & BCBS    Progress Note Due on Visit 10    PT Start Time 1100    PT Stop Time 1148    PT Time Calculation (min) 48 min    Activity Tolerance Patient tolerated treatment well    Behavior During Therapy [facility] for tasks assessed/performed           Past Medical History:  Diagnosis Date  . Anemia    low iron  . Arthritis   . Coronary artery disease 2006   a. remote stenting to ramus, prox LAD x2.  . Dental crowns present   . Diabetes mellitus   . First degree AV block   . GERD (gastroesophageal reflux disease)   . Heart attack ([place]) 06/2004  . Heart murmur    aortic  . Hx MRSA infection 02/2006  . Hyperlipidemia   . Hypertension    hx. of - has not been on med. since losing wt. after gastric bypass  . Hypothyroidism   . Morbid obesity ([place])   . Mucoid cyst of joint 09/2011   left thumb  . Sleep apnea    sleep study 03/29/2011; no CPAP use, lost >130lbs  . Trifascicular block     Past Surgical History:  Procedure Laterality Date  . AMPUTATION Left 04/19/2016   Procedure: Left Foot 4th Toe Amputation vs. Transmetatarsal;  Surgeon: [name], MD;  Location: [place];  Service: Orthopedics;  Laterality: Left;  . BIOPSY  04/07/2019   Procedure: BIOPSY;  Surgeon: Otis Brace, MD;  [facility];  Service: Gastroenterology;;  . CATARACT EXTRACTION  02/2010; 03/2010  . COLONOSCOPY WITH PROPOFOL N/A 09/14/2014   Procedure: COLONOSCOPY WITH PROPOFOL;  Surgeon: Garlan Fair, MD;  [facility];  Service:  Endoscopy;  Laterality: N/A;  . COLONOSCOPY WITH PROPOFOL N/A 04/07/2019   Procedure: COLONOSCOPY WITH PROPOFOL;  Surgeon: Otis Brace, MD;  [facility];  Service: Gastroenterology;  Laterality: N/A;  . CORONARY ANGIOPLASTY WITH STENT PLACEMENT  07/12/2004; 08/03/2004   total of 3 stents  . I & D EXTREMITY Right 06/04/2014   Procedure: IRRIGATION AND DEBRIDEMENT EXTREMITY;  Surgeon: [name], MD;  Location: [place];  Service: Orthopedics;  Laterality: Right;  . MASS EXCISION  10/04/2011   Procedure: EXCISION MASS;  Surgeon: Wynonia Sours, MD;  Location: [place];  Service: Orthopedics;  Laterality: Left;  excision cyst debridment ip joint of left thumb  . PROSTATECTOMY    . right below knee amputation  2018  . ROUX-EN-Y GASTRIC BYPASS  10/10/2010   laparoscopic  . SHOULDER ARTHROSCOPY WITH ROTATOR CUFF REPAIR AND SUBACROMIAL DECOMPRESSION Right 03/23/2020   Procedure: RIGHT SHOULDER ARTHROSCOPY WITH DEBRIDEMENT AND  ROTATOR CUFF REPAIR;  Surgeon: [name], MD;  Location: [place];  Service: Orthopedics;  Laterality: Right;  . TOE AMPUTATION  02/23/2006   left foot second ray amputation  . TOTAL HIP ARTHROPLASTY Right 03/29/2020   Procedure: TOTAL HIP ARTHROPLASTY POSTERIOR APPROACH;  Surgeon: [name], MD;  Location: [place];  Service: Orthopedics;  Laterality: Right;  . TRANSMETATARSAL AMPUTATION Left   . TRIGGER FINGER RELEASE Right 09/16/2019   Procedure: RELEASE TRIGGER FINGER/A-1 PULLEY;  Surgeon: Daryll Brod, MD;  Location: [place];  Service: Orthopedics;  Laterality: Right;  IV REGIONAL FOREARM BLOCK    There were no vitals filed for this visit.   Subjective Assessment - 06/03/20 1110    Subjective Relays only about 2-3/10 pain in his Right shoulder today however he noticed a lot of pain with trying to sleep last night    Patient is accompained by: Family member    Pertinent History right TTA, right shoulder  arthroscopy, right THA. CHF, CAD, PVD, DM2, MI    Patient Stated Goals to use right shoulder & hip to return to high level of function with his prosthesis.    Pain Onset More than a month ago            [facility] Adult PT Treatment/Exercise - 06/03/20 0001      Shoulder Exercises: Seated   Retraction Limitations red 2X15    External Rotation Right;15 reps    Theraband Level (Shoulder External Rotation) Level 2 (Red)    External Rotation Limitations 2 sets    Internal Rotation Right;15 reps    Theraband Level (Shoulder Internal Rotation) Level 2 (Red)    Internal Rotation Limitations 2 sets    Flexion AROM;Both;10 reps    Flexion Limitations 2 sets    Abduction Both;AROM;10 reps    ABduction Limitations 2 sets      Shoulder Exercises: Sidelying   Other Sidelying Exercises Rt ER 2X15, abd 2X15 (pillow between knees to avoid hip adduction as he has posterior hip precautions)      Shoulder Exercises: Standing   Other Standing Exercises wall ladder X15 circles CW,CCW      Shoulder Exercises: Pulleys   Flexion 2 minutes    ABduction 2 minutes      Shoulder Exercises: ROM/Strengthening   UBE (Upper Arm Bike) no resistance 2.5 min fwd, 2.5 min retro      Vasopneumatic   Number Minutes Vasopneumatic  10 minutes    Vasopnuematic Location  Shoulder    Vasopneumatic Pressure Medium    Vasopneumatic Temperature  34      Manual Therapy   Manual therapy comments Grade 2-3 GH mobs inferior, A-P, distraction, right shoulder,  PROM in all planes to tolerance                    PT Short Term Goals - 06/01/20 1149      PT SHORT TERM GOAL #1   Title Patient demonstrates understanding of HEP    Status On-going      PT SHORT TERM GOAL #2   Title right shoulder PROM flexion 120*, abduction 100* and Ext Rot 45*    Baseline meet see flowsheets    Status Achieved             PT Long Term Goals - 05/18/20 1607      PT LONG TERM GOAL #1   Title Patient FOTO score 67%     Time 8    Period Weeks    Status New    Target Date 07/09/20      PT LONG TERM GOAL #2   Title Right shoulder PROM within normal limits    Time 8    Period Weeks    Status New    Target Date 07/09/20  PT LONG TERM GOAL #3   Title Right shoulder strength 5/5    Time 8    Period Weeks    Status New    Target Date 07/09/20      PT LONG TERM GOAL #4   Title Patient reports right shoulder pain 0/10    Time 8    Period Weeks    Status New    Target Date 07/09/20                 Plan - 06/03/20 1145    Clinical Impression Statement Continued with Rt shoulder strength and ROM progression with good overall tolrance and he will continue to benefit from PT to maximize his function.    Personal Factors and Comorbidities Age;Comorbidity 3+    Comorbidities right TTA, right shoulder arthroscopy, right THA. CHF, CAD, PVD, DM2, MI    Examination-Activity Limitations Carry;Lift;Locomotion Level;Reach Overhead    Examination-Participation Restrictions Community Activity    Stability/Clinical Decision Making Evolving/Moderate complexity    Rehab Potential Good    PT Frequency 2x / week    PT Duration 8 weeks    PT Treatment/Interventions ADLs/Self Care Home Management;Cryotherapy;Electrical Stimulation;Ultrasound;Therapeutic activities;Therapeutic exercise;Neuromuscular re-education;Patient/family education;Manual techniques;Passive range of motion;Dry needling;Vasopneumatic Device;Joint Manipulations    PT Next Visit Plan update HEP, PROM & manual therapy to increase shoulder range, assess & update plan of care to include hip when Dr. [name] refers    PT Home Exercise Plan Access Code: [email or web address]  URL: [email or web address]  Date: 05/28/2020  Prepared by: Carolyne Littles    Exercises  Supine Scapular Retraction - 1 x daily - 7 x weekly - 2 sets - 10 reps - 5 seconds hold  Standing Scapular Depression - 1 x daily - 7 x weekly - 2 sets - 10 reps - 5 seconds hold  Supine Shoulder  Flexion AAROM with Hands Clasped - 3 x daily - 7 x weekly - 2 sets - 10 reps - 10 seconds hold  Supine Shoulder External Rotation in 45 Degrees Abduction AAROM with Dowel - 3 x daily - 7 x weekly - 2 sets - 10 reps - 10 seconds hold  Sidelying Shoulder External Rotation - 1 x daily - 7 x weekly - 3 sets - 10 reps  Shoulder extension with resistance - Neutral - 1 x daily - 7 x weekly - 3 sets - 10 reps  Scapular Retraction with Resistance - 1 x daily - 7 x weekly - 3 sets - 10 reps  advised to do banded restractions if tolerated.    Consulted and Agree with Plan of Care Patient;Family member/caregiver           Patient will benefit from skilled therapeutic intervention in order to improve the following deficits and impairments:  Decreased strength,Decreased mobility,Decreased range of motion,Increased edema,Impaired flexibility,Postural dysfunction,Pain  Visit Diagnosis: Acute pain of right shoulder  Stiffness of right shoulder, not elsewhere classified  Muscle weakness (generalized)  Abnormal posture  Localized edema     Problem List Patient Active Problem List   Diagnosis Date Noted  . S/P arthroscopy of right shoulder 06/02/2020  . Closed fracture of neck of right femur ([place])   . Hip fracture ([place]) 03/27/2020  . Nontraumatic complete tear of right rotator cuff 02/26/2020  . S/P transmetatarsal amputation of foot, left ([place]) 04/25/2016  . Chronic osteomyelitis of toe, left ([place])   . Cellulitis of toe of left foot   . Chest pain 09/25/2015  . CAD (coronary artery disease) of  artery bypass graft 09/25/2015  . Sinus bradycardia on ECG 09/25/2015  . Chronic diastolic CHF (congestive heart failure) ([place]) 09/25/2015  . Chest pain at rest 09/25/2015  . Aortic valvular disorder 08/04/2014  . Disorder of mitral valve 08/04/2014  . Diabetic foot infection ([place]) 06/03/2014  . Peripheral vascular disease ([place]) 06/03/2014  . Abscess of foot 06/03/2014  . CAD (coronary artery disease)  07/29/2013  . Trifascicular block 07/29/2013  . 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 Hypothyroidism 01/22/2007  . Type 2 diabetes mellitus with vascular disease ([place]) 01/22/2007  . HYPERLIPIDEMIA, MIXED 01/22/2007  . MYOCARDIAL INFARCTION, HX OF 01/22/2007  . DEGENERATIVE JOINT DISEASE 01/22/2007  . ANEMIA, IRON DEFICIENCY, HX OF 01/22/2007    [name] 06/03/2020, 11:46 AM  [facility] Physical Therapy [address] [place], [place], [address] Phone: [phone]   Fax:  [phone]  Name: [name] MRN: [id number] Date of Birth: 1950/03/08

## 2020-06-08 ENCOUNTER — Other Ambulatory Visit: Payer: Self-pay | Admitting: Internal Medicine

## 2020-06-08 DIAGNOSIS — Z8781 Personal history of (healed) traumatic fracture: Secondary | ICD-10-CM

## 2020-06-08 DIAGNOSIS — E11621 Type 2 diabetes mellitus with foot ulcer: Secondary | ICD-10-CM | POA: Diagnosis not present

## 2020-06-08 DIAGNOSIS — E1149 Type 2 diabetes mellitus with other diabetic neurological complication: Secondary | ICD-10-CM | POA: Diagnosis not present

## 2020-06-08 DIAGNOSIS — K219 Gastro-esophageal reflux disease without esophagitis: Secondary | ICD-10-CM | POA: Diagnosis not present

## 2020-06-08 DIAGNOSIS — I10 Essential (primary) hypertension: Secondary | ICD-10-CM | POA: Diagnosis not present

## 2020-06-09 ENCOUNTER — Encounter (HOSPITAL_BASED_OUTPATIENT_CLINIC_OR_DEPARTMENT_OTHER): Payer: Self-pay | Admitting: Physical Therapy

## 2020-06-09 ENCOUNTER — Other Ambulatory Visit: Payer: Self-pay

## 2020-06-09 ENCOUNTER — Ambulatory Visit (HOSPITAL_BASED_OUTPATIENT_CLINIC_OR_DEPARTMENT_OTHER): Payer: Medicare Other | Admitting: Physical Therapy

## 2020-06-09 DIAGNOSIS — M6281 Muscle weakness (generalized): Secondary | ICD-10-CM

## 2020-06-09 DIAGNOSIS — M25511 Pain in right shoulder: Secondary | ICD-10-CM

## 2020-06-09 DIAGNOSIS — M25611 Stiffness of right shoulder, not elsewhere classified: Secondary | ICD-10-CM

## 2020-06-09 DIAGNOSIS — R293 Abnormal posture: Secondary | ICD-10-CM | POA: Diagnosis not present

## 2020-06-09 DIAGNOSIS — R600 Localized edema: Secondary | ICD-10-CM

## 2020-06-09 NOTE — Therapy (Signed)
[facility] [address] [place], [place], [address] Phone: [phone]   Fax:  [phone]  Physical Therapy Treatment  Patient Details  Name: [name] MRN: [id number] Date of Birth: Aug 11, 1950 Referring Provider (PT): Benita Stabile, [place]   Encounter Date: 06/09/2020   PT End of Session - 06/09/20 0917    Visit Number 7    Number of Visits 17    Date for PT Re-Evaluation 07/09/20    Authorization Type Medicare & BCBS    PT Start Time 0845    PT Stop Time 0928    PT Time Calculation (min) 43 min    Activity Tolerance Patient tolerated treatment well    Behavior During Therapy [facility] for tasks assessed/performed           Past Medical History:  Diagnosis Date  . Anemia    low iron  . Arthritis   . Coronary artery disease 2006   a. remote stenting to ramus, prox LAD x2.  . Dental crowns present   . Diabetes mellitus   . First degree AV block   . GERD (gastroesophageal reflux disease)   . Heart attack ([place]) 06/2004  . Heart murmur    aortic  . Hx MRSA infection 02/2006  . Hyperlipidemia   . Hypertension    hx. of - has not been on med. since losing wt. after gastric bypass  . Hypothyroidism   . Morbid obesity ([place])   . Mucoid cyst of joint 09/2011   left thumb  . Sleep apnea    sleep study 03/29/2011; no CPAP use, lost >130lbs  . Trifascicular block     Past Surgical History:  Procedure Laterality Date  . AMPUTATION Left 04/19/2016   Procedure: Left Foot 4th Toe Amputation vs. Transmetatarsal;  Surgeon: [name], MD;  Location: [place];  Service: Orthopedics;  Laterality: Left;  . BIOPSY  04/07/2019   Procedure: BIOPSY;  Surgeon: Otis Brace, MD;  [facility];  Service: Gastroenterology;;  . CATARACT EXTRACTION  02/2010; 03/2010  . COLONOSCOPY WITH PROPOFOL N/A 09/14/2014   Procedure: COLONOSCOPY WITH PROPOFOL;  Surgeon: Garlan Fair, MD;  [facility];  Service: Endoscopy;  Laterality:  N/A;  . COLONOSCOPY WITH PROPOFOL N/A 04/07/2019   Procedure: COLONOSCOPY WITH PROPOFOL;  Surgeon: Otis Brace, MD;  [facility];  Service: Gastroenterology;  Laterality: N/A;  . CORONARY ANGIOPLASTY WITH STENT PLACEMENT  07/12/2004; 08/03/2004   total of 3 stents  . I & D EXTREMITY Right 06/04/2014   Procedure: IRRIGATION AND DEBRIDEMENT EXTREMITY;  Surgeon: [name], MD;  Location: [place];  Service: Orthopedics;  Laterality: Right;  . MASS EXCISION  10/04/2011   Procedure: EXCISION MASS;  Surgeon: Wynonia Sours, MD;  Location: [place];  Service: Orthopedics;  Laterality: Left;  excision cyst debridment ip joint of left thumb  . PROSTATECTOMY    . right below knee amputation  2018  . ROUX-EN-Y GASTRIC BYPASS  10/10/2010   laparoscopic  . SHOULDER ARTHROSCOPY WITH ROTATOR CUFF REPAIR AND SUBACROMIAL DECOMPRESSION Right 03/23/2020   Procedure: RIGHT SHOULDER ARTHROSCOPY WITH DEBRIDEMENT AND  ROTATOR CUFF REPAIR;  Surgeon: [name], MD;  Location: [place];  Service: Orthopedics;  Laterality: Right;  . TOE AMPUTATION  02/23/2006   left foot second ray amputation  . TOTAL HIP ARTHROPLASTY Right 03/29/2020   Procedure: TOTAL HIP ARTHROPLASTY POSTERIOR APPROACH;  Surgeon: [name], MD;  Location: [place];  Service: Orthopedics;  Laterality: Right;  . TRANSMETATARSAL AMPUTATION Left   . TRIGGER FINGER RELEASE Right 09/16/2019   Procedure: RELEASE TRIGGER FINGER/A-1 PULLEY;  Surgeon: Daryll Brod, MD;  Location: [place];  Service: Orthopedics;  Laterality: Right;  IV REGIONAL FOREARM BLOCK    There were no vitals filed for this visit.   Subjective Assessment - 06/09/20 0911    Subjective Patient reports his shoulder pain has been abut a 02-26-08 over the last few days. he is not sure why he is so sore.    Pertinent History right TTA, right shoulder arthroscopy, right THA. CHF, CAD, PVD, DM2, MI    Patient Stated Goals to use right  shoulder & hip to return to high level of function with his prosthesis.    Currently in Pain? Yes    Pain Score 3     Pain Location Shoulder    Pain Orientation Right    Pain Descriptors / Indicators Aching    Pain Type Chronic pain    Pain Onset More than a month ago    Pain Frequency Intermittent    Aggravating Factors  hurts with certain movements    Pain Relieving Factors tylenol                             [facility] Adult PT Treatment/Exercise - 06/09/20 0001      Shoulder Exercises: Supine   Other Supine Exercises wand flexion x20 1lb      Shoulder Exercises: Sidelying   Other Sidelying Exercises Rt ER 2X15 1lb , abd 2X15 (pillow between knees to avoid hip adduction as he has posterior hip precautions)      Shoulder Exercises: Standing   External Rotation Limitations 2x20 green    Internal Rotation Limitations 2x20 green    Extension Limitations 2x20 green    Row Limitations 2x20 green    Other Standing Exercises wall ladder x5      Shoulder Exercises: Pulleys   Flexion 2 minutes    ABduction 2 minutes      Manual Therapy   Manual therapy comments Grade 2-3 GH mobs inferior, A-P, distraction, right shoulder,  PROM in all planes to tolerance                  PT Education - 06/09/20 0915    Education Details HEP and symptom mangement    Person(s) Educated Patient    Methods Explanation;Demonstration;Verbal cues;Tactile cues    Comprehension Verbalized understanding;Returned demonstration;Verbal cues required;Tactile cues required            PT Short Term Goals - 06/01/20 1149      PT SHORT TERM GOAL #1   Title Patient demonstrates understanding of HEP    Status On-going      PT SHORT TERM GOAL #2   Title right shoulder PROM flexion 120*, abduction 100* and Ext Rot 45*    Baseline meet see flowsheets    Status Achieved             PT Long Term Goals - 05/18/20 1607      PT LONG TERM GOAL #1   Title Patient FOTO score 67%     Time 8    Period Weeks    Status New    Target Date 07/09/20      PT LONG TERM GOAL #2   Title Right shoulder PROM within normal limits    Time 8    Period  Weeks    Status New    Target Date 07/09/20      PT LONG TERM GOAL #3   Title Right shoulder strength 5/5    Time 8    Period Weeks    Status New    Target Date 07/09/20      PT LONG TERM GOAL #4   Title Patient reports right shoulder pain 0/10    Time 8    Period Weeks    Status New    Target Date 07/09/20                 Plan - 06/09/20 1630    Clinical Impression Statement Patient continues to progress. His ROM has improved per visual inspection. Therapy advanced his scapular strengthening to green today and added a weight to er. He had no increase in pain, Just the same ache he came in with.    Personal Factors and Comorbidities Age;Comorbidity 3+    Comorbidities right TTA, right shoulder arthroscopy, right THA. CHF, CAD, PVD, DM2, MI    Examination-Activity Limitations Carry;Lift;Locomotion Level;Reach Overhead    Examination-Participation Restrictions Community Activity    Stability/Clinical Decision Making Evolving/Moderate complexity    PT Treatment/Interventions ADLs/Self Care Home Management;Cryotherapy;Electrical Stimulation;Ultrasound;Therapeutic activities;Therapeutic exercise;Neuromuscular re-education;Patient/family education;Manual techniques;Passive range of motion;Dry needling;Vasopneumatic Device;Joint Manipulations    PT Next Visit Plan update HEP, PROM & manual therapy to increase shoulder range, assess & update plan of care to include hip when Dr. [name] refers    PT Home Exercise Plan Access Code: [email or web address]  URL: [email or web address]  Date: 05/28/2020  Prepared by: Carolyne Littles    Exercises  Supine Scapular Retraction - 1 x daily - 7 x weekly - 2 sets - 10 reps - 5 seconds hold  Standing Scapular Depression - 1 x daily - 7 x weekly - 2 sets - 10 reps - 5 seconds hold  Supine Shoulder  Flexion AAROM with Hands Clasped - 3 x daily - 7 x weekly - 2 sets - 10 reps - 10 seconds hold  Supine Shoulder External Rotation in 45 Degrees Abduction AAROM with Dowel - 3 x daily - 7 x weekly - 2 sets - 10 reps - 10 seconds hold  Sidelying Shoulder External Rotation - 1 x daily - 7 x weekly - 3 sets - 10 reps  Shoulder extension with resistance - Neutral - 1 x daily - 7 x weekly - 3 sets - 10 reps  Scapular Retraction with Resistance - 1 x daily - 7 x weekly - 3 sets - 10 reps  advised to do banded restractions if tolerated.    Consulted and Agree with Plan of Care Patient;Family member/caregiver           Patient will benefit from skilled therapeutic intervention in order to improve the following deficits and impairments:  Decreased strength,Decreased mobility,Decreased range of motion,Increased edema,Impaired flexibility,Postural dysfunction,Pain  Visit Diagnosis: Acute pain of right shoulder  Stiffness of right shoulder, not elsewhere classified  Muscle weakness (generalized)  Abnormal posture  Localized edema     Problem List Patient Active Problem List   Diagnosis Date Noted  . S/P arthroscopy of right shoulder 06/02/2020  . Closed fracture of neck of right femur ([place])   . Hip fracture ([place]) 03/27/2020  . Nontraumatic complete tear of right rotator cuff 02/26/2020  . S/P transmetatarsal amputation of foot, left ([place]) 04/25/2016  . Chronic osteomyelitis of toe, left ([place])   . Cellulitis of toe of left foot   .  Chest pain 09/25/2015  . CAD (coronary artery disease) of artery bypass graft 09/25/2015  . Sinus bradycardia on ECG 09/25/2015  . Chronic diastolic CHF (congestive heart failure) ([place]) 09/25/2015  . Chest pain at rest 09/25/2015  . Aortic valvular disorder 08/04/2014  . Disorder of mitral valve 08/04/2014  . Diabetic foot infection ([place]) 06/03/2014  . Peripheral vascular disease ([place]) 06/03/2014  . Abscess of foot 06/03/2014  . CAD (coronary artery disease)  07/29/2013  . Trifascicular block 07/29/2013  . 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 Hypothyroidism 01/22/2007  . Type 2 diabetes mellitus with vascular disease ([place]) 01/22/2007  . HYPERLIPIDEMIA, MIXED 01/22/2007  . MYOCARDIAL INFARCTION, HX OF 01/22/2007  . DEGENERATIVE JOINT DISEASE 01/22/2007  . ANEMIA, IRON DEFICIENCY, HX OF 01/22/2007    Carney Living PT DPT  06/09/2020, 4:35 PM  [facility] [address] [place], [place], [address] Phone: [phone]   Fax:  [phone]  Name: [name] MRN: [id number] Date of Birth: August 05, 1950

## 2020-06-09 NOTE — Therapy (Deleted)
[facility] [address] [place], [place], [address] Phone: [phone]   Fax:  [phone]  Patient Details  Name: [name] MRN: [id number] Date of Birth: 02/08/1950 Referring Provider:  [name], MD  Encounter Date: 06/09/2020   Carney Living 06/09/2020, 4:35 PM  [facility] [address] [place], [place], [address] Phone: [phone]   Fax:  [phone]

## 2020-06-17 ENCOUNTER — Encounter: Payer: Self-pay | Admitting: Orthopaedic Surgery

## 2020-06-17 ENCOUNTER — Ambulatory Visit (INDEPENDENT_AMBULATORY_CARE_PROVIDER_SITE_OTHER): Payer: Medicare Other

## 2020-06-17 ENCOUNTER — Other Ambulatory Visit: Payer: Self-pay

## 2020-06-17 ENCOUNTER — Ambulatory Visit (INDEPENDENT_AMBULATORY_CARE_PROVIDER_SITE_OTHER): Payer: Medicare Other | Admitting: Orthopaedic Surgery

## 2020-06-17 VITALS — Ht >= 80 in | Wt 245.0 lb

## 2020-06-17 DIAGNOSIS — Z96641 Presence of right artificial hip joint: Secondary | ICD-10-CM

## 2020-06-17 NOTE — Progress Notes (Signed)
Post-Op Visit Note   Patient: [name]           Date of Birth: [birth year]           MRN: [id number] Visit Date: 06/17/2020 PCP: [name], MD   Assessment & Plan:  Chief Complaint:  Chief Complaint  Patient presents with  . Right Hip - Follow-up    Right total hip arthroplasty 03/29/2020   Visit Diagnoses:  1. Status post total replacement of right hip     Plan: Patient is a pleasant 70 year old gentleman who comes in today 3 months out right posterior total hip replacement.  He has been doing well.  He notes some discomfort with lying on the lateral side while in the bed.  Otherwise, no complaints.  He is regained near full range of motion.  Examination of his right hip reveals painless range of motion.  Full hip flexion.  At this point, restrictions will be discontinued about a week and a half when he is a full 3 months out from surgery.  He is aware and agreeable to this plan.  He will follow-up with [place] in 3 months time for repeat evaluation and AP pelvis x-rays.  Call with concerns or questions in meantime.  Follow-Up Instructions: Return in about 3 months (around 09/17/2020).   Orders:  Orders Placed This Encounter  Procedures  . XR HIP UNILAT W OR W/O PELVIS 2-3 VIEWS RIGHT   No orders of the defined types were placed in this encounter.   Imaging: XR HIP UNILAT W OR W/O PELVIS 2-3 VIEWS RIGHT  Result Date: 06/17/2020 X-rays demonstrate a well-seated prosthesis without complication   PMFS History: Patient Active Problem List   Diagnosis Date Noted  . S/P arthroscopy of right shoulder 06/02/2020  . Closed fracture of neck of right femur ([place])   . Hip fracture ([employer]) 03/27/2020  . Nontraumatic complete tear of right rotator cuff 02/26/2020  . S/P transmetatarsal amputation of foot, left ([place]) 04/25/2016  . Chronic osteomyelitis of toe, left ([place])   . Cellulitis of toe of left foot   . Chest pain 09/25/2015  . CAD (coronary artery disease) of artery bypass graft  09/25/2015  . Sinus bradycardia on ECG 09/25/2015  . Chronic diastolic CHF (congestive heart failure) ([place]) 09/25/2015  . Chest pain at rest 09/25/2015  . Aortic valvular disorder 08/04/2014  . Disorder of mitral valve 08/04/2014  . Diabetic foot infection ([facility]) 06/03/2014  . Peripheral vascular disease ([place]) 06/03/2014  . Abscess of foot 06/03/2014  . CAD (coronary artery disease) 07/29/2013  . Trifascicular block 07/29/2013  . 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 Hypothyroidism 01/22/2007  . Type 2 diabetes mellitus with vascular disease ([place]) 01/22/2007  . HYPERLIPIDEMIA, MIXED 01/22/2007  . MYOCARDIAL INFARCTION, HX OF 01/22/2007  . DEGENERATIVE JOINT DISEASE 01/22/2007  . ANEMIA, IRON DEFICIENCY, HX OF 01/22/2007   Past Medical History:  Diagnosis Date  . Anemia    low iron  . Arthritis   . Coronary artery disease 2006   a. remote stenting to ramus, prox LAD x2.  . Dental crowns present   . Diabetes mellitus   . First degree AV block   . GERD (gastroesophageal reflux disease)   . Heart attack ([place]) 06/2004  . Heart murmur    aortic  . Hx MRSA infection 02/2006  . Hyperlipidemia   . Hypertension    hx. of - has not been on med. since losing wt. after  gastric bypass  . Hypothyroidism   . Morbid obesity ([place])   . Mucoid cyst of joint 09/2011   left thumb  . Sleep apnea    sleep study 03/29/2011; no CPAP use, lost >130lbs  . Trifascicular block     Family History  Problem Relation Age of Onset  . Heart disease Mother     Past Surgical History:  Procedure Laterality Date  . AMPUTATION Left 04/19/2016   Procedure: Left Foot 4th Toe Amputation vs. Transmetatarsal;  Surgeon: [name], MD;  Location: [facility];  Service: Orthopedics;  Laterality: Left;  . BIOPSY  04/07/2019   Procedure: BIOPSY;  Surgeon: Otis Brace, MD;  Location: [facility];  Service: Gastroenterology;;  . CATARACT EXTRACTION  02/2010; 03/2010  . COLONOSCOPY WITH PROPOFOL N/A  09/14/2014   Procedure: COLONOSCOPY WITH PROPOFOL;  Surgeon: Garlan Fair, MD;  [facility];  Service: Endoscopy;  Laterality: N/A;  . COLONOSCOPY WITH PROPOFOL N/A 04/07/2019   Procedure: COLONOSCOPY WITH PROPOFOL;  Surgeon: Otis Brace, MD;  [facility];  Service: Gastroenterology;  Laterality: N/A;  . CORONARY ANGIOPLASTY WITH STENT PLACEMENT  07/12/2004; 08/03/2004   total of 3 stents  . I & D EXTREMITY Right 06/04/2014   Procedure: IRRIGATION AND DEBRIDEMENT EXTREMITY;  Surgeon: [name], MD;  Location: [facility];  Service: Orthopedics;  Laterality: Right;  . MASS EXCISION  10/04/2011   Procedure: EXCISION MASS;  Surgeon: Wynonia Sours, MD;  Location: [facility];  Service: Orthopedics;  Laterality: Left;  excision cyst debridment ip joint of left thumb  . PROSTATECTOMY    . right below knee amputation  2018  . ROUX-EN-Y GASTRIC BYPASS  10/10/2010   laparoscopic  . SHOULDER ARTHROSCOPY WITH ROTATOR CUFF REPAIR AND SUBACROMIAL DECOMPRESSION Right 03/23/2020   Procedure: RIGHT SHOULDER ARTHROSCOPY WITH DEBRIDEMENT AND  ROTATOR CUFF REPAIR;  Surgeon: [name], MD;  Location: [facility];  Service: Orthopedics;  Laterality: Right;  . TOE AMPUTATION  02/23/2006   left foot second ray amputation  . TOTAL HIP ARTHROPLASTY Right 03/29/2020   Procedure: TOTAL HIP ARTHROPLASTY POSTERIOR APPROACH;  Surgeon: [name], MD;  Location: [facility];  Service: Orthopedics;  Laterality: Right;  . TRANSMETATARSAL AMPUTATION Left   . TRIGGER FINGER RELEASE Right 09/16/2019   Procedure: RELEASE TRIGGER FINGER/A-1 PULLEY;  Surgeon: Daryll Brod, MD;  Location: [facility];  Service: Orthopedics;  Laterality: Right;  IV REGIONAL FOREARM BLOCK   Social History   Occupational History  . Occupation: Engineer, maintenance (IT) taxes  Tobacco Use  . Smoking status: Never Smoker  . Smokeless tobacco: Never Used  Substance and Sexual Activity  . Alcohol use: No  . Drug  use: No  . Sexual activity: Yes

## 2020-06-18 ENCOUNTER — Encounter (HOSPITAL_BASED_OUTPATIENT_CLINIC_OR_DEPARTMENT_OTHER): Payer: Self-pay | Admitting: Physical Therapy

## 2020-06-18 ENCOUNTER — Ambulatory Visit (HOSPITAL_BASED_OUTPATIENT_CLINIC_OR_DEPARTMENT_OTHER): Payer: Medicare Other | Admitting: Physical Therapy

## 2020-06-18 DIAGNOSIS — R293 Abnormal posture: Secondary | ICD-10-CM | POA: Diagnosis not present

## 2020-06-18 DIAGNOSIS — M25511 Pain in right shoulder: Secondary | ICD-10-CM | POA: Diagnosis not present

## 2020-06-18 DIAGNOSIS — R600 Localized edema: Secondary | ICD-10-CM

## 2020-06-18 DIAGNOSIS — M6281 Muscle weakness (generalized): Secondary | ICD-10-CM

## 2020-06-18 DIAGNOSIS — M25611 Stiffness of right shoulder, not elsewhere classified: Secondary | ICD-10-CM

## 2020-06-18 NOTE — Therapy (Signed)
[facility] [address] [place], [place], [address] Phone: [phone]   Fax:  [phone]  Physical Therapy Treatment  Patient Details  Name: [name] MRN: [id number] Date of Birth: 02-Nov-1950 Referring Provider (PT): Benita Stabile, [place]   Encounter Date: 06/18/2020   PT End of Session - 06/18/20 0944    Visit Number 8    Number of Visits 17    Date for PT Re-Evaluation 07/09/20    Authorization Type Medicare & BCBS    PT Start Time 0927    PT Stop Time 1010    PT Time Calculation (min) 43 min    Activity Tolerance Patient tolerated treatment well    Behavior During Therapy [facility] for tasks assessed/performed           Past Medical History:  Diagnosis Date  . Anemia    low iron  . Arthritis   . Coronary artery disease 2006   a. remote stenting to ramus, prox LAD x2.  . Dental crowns present   . Diabetes mellitus   . First degree AV block   . GERD (gastroesophageal reflux disease)   . Heart attack ([place]) 06/2004  . Heart murmur    aortic  . Hx MRSA infection 02/2006  . Hyperlipidemia   . Hypertension    hx. of - has not been on med. since losing wt. after gastric bypass  . Hypothyroidism   . Morbid obesity ([place])   . Mucoid cyst of joint 09/2011   left thumb  . Sleep apnea    sleep study 03/29/2011; no CPAP use, lost >130lbs  . Trifascicular block     Past Surgical History:  Procedure Laterality Date  . AMPUTATION Left 04/19/2016   Procedure: Left Foot 4th Toe Amputation vs. Transmetatarsal;  Surgeon: [name], MD;  Location: [facility];  Service: Orthopedics;  Laterality: Left;  . BIOPSY  04/07/2019   Procedure: BIOPSY;  Surgeon: Otis Brace, MD;  [facility];  Service: Gastroenterology;;  . CATARACT EXTRACTION  02/2010; 03/2010  . COLONOSCOPY WITH PROPOFOL N/A 09/14/2014   Procedure: COLONOSCOPY WITH PROPOFOL;  Surgeon: Garlan Fair, MD;  [facility];  Service: Endoscopy;  Laterality:  N/A;  . COLONOSCOPY WITH PROPOFOL N/A 04/07/2019   Procedure: COLONOSCOPY WITH PROPOFOL;  Surgeon: Otis Brace, MD;  [facility];  Service: Gastroenterology;  Laterality: N/A;  . CORONARY ANGIOPLASTY WITH STENT PLACEMENT  07/12/2004; 08/03/2004   total of 3 stents  . I & D EXTREMITY Right 06/04/2014   Procedure: IRRIGATION AND DEBRIDEMENT EXTREMITY;  Surgeon: [name], MD;  Location: [facility];  Service: Orthopedics;  Laterality: Right;  . MASS EXCISION  10/04/2011   Procedure: EXCISION MASS;  Surgeon: Wynonia Sours, MD;  Location: [facility];  Service: Orthopedics;  Laterality: Left;  excision cyst debridment ip joint of left thumb  . PROSTATECTOMY    . right below knee amputation  2018  . ROUX-EN-Y GASTRIC BYPASS  10/10/2010   laparoscopic  . SHOULDER ARTHROSCOPY WITH ROTATOR CUFF REPAIR AND SUBACROMIAL DECOMPRESSION Right 03/23/2020   Procedure: RIGHT SHOULDER ARTHROSCOPY WITH DEBRIDEMENT AND  ROTATOR CUFF REPAIR;  Surgeon: [name], MD;  Location: [facility];  Service: Orthopedics;  Laterality: Right;  . TOE AMPUTATION  02/23/2006   left foot second ray amputation  . TOTAL HIP ARTHROPLASTY Right 03/29/2020   Procedure: TOTAL HIP ARTHROPLASTY POSTERIOR APPROACH;  Surgeon: [name], MD;  Location: [facility];  Service: Orthopedics;  Laterality: Right;  . TRANSMETATARSAL AMPUTATION Left   . TRIGGER FINGER RELEASE Right 09/16/2019   Procedure: RELEASE TRIGGER FINGER/A-1 PULLEY;  Surgeon: Daryll Brod, MD;  Location: [facility];  Service: Orthopedics;  Laterality: Right;  IV REGIONAL FOREARM BLOCK    There were no vitals filed for this visit.   Subjective Assessment - 06/18/20 0943    Subjective Patient reports Wednesday night he had significant pain in his shoulder. It was better last night. He did drive home from the beach, but he tried to eep his right arm down. He is not having pain this morning    Pertinent History right TTA, right  shoulder arthroscopy, right THA. CHF, CAD, PVD, DM2, MI    Patient Stated Goals to use right shoulder & hip to return to high level of function with his prosthesis.    Currently in Pain? No/denies                             [facility] Adult PT Treatment/Exercise - 06/18/20 0001      Knee/Hip Exercises: Sidelying   Other Sidelying Knee/Hip Exercises sidelying er 2x15  1lb      Shoulder Exercises: Supine   Other Supine Exercises wand flexion x20 2lb; right shoulder flexion 1lb x20      Shoulder Exercises: Sidelying   Other Sidelying Exercises Rt ER 2X15 1lb , abd 2X15 (pillow between knees to avoid hip adduction as he has posterior hip precautions)      Shoulder Exercises: Standing   External Rotation Limitations 2x20 green    Internal Rotation Limitations 2x20 green    Extension Limitations 2x20 green    Row Limitations 2x20 green    Other Standing Exercises forward fleixon with towel closed chain 2x10; cabbinet reach 2x10 to first shelf      Shoulder Exercises: Pulleys   Flexion 2 minutes    ABduction 2 minutes      Manual Therapy   Manual therapy comments Grade 2-3 GH mobs inferior, A-P, distraction, right shoulder,  PROM in all planes to tolerance                  PT Education - 06/18/20 0944    Education Details HEP and symptom mangement    Person(s) Educated Patient    Methods Explanation;Demonstration;Tactile cues;Verbal cues    Comprehension Verbalized understanding;Returned demonstration;Verbal cues required;Tactile cues required            PT Short Term Goals - 06/01/20 1149      PT SHORT TERM GOAL #1   Title Patient demonstrates understanding of HEP    Status On-going      PT SHORT TERM GOAL #2   Title right shoulder PROM flexion 120*, abduction 100* and Ext Rot 45*    Baseline meet see flowsheets    Status Achieved             PT Long Term Goals - 05/18/20 1607      PT LONG TERM GOAL #1   Title Patient FOTO score 67%     Time 8    Period Weeks    Status New    Target Date 07/09/20      PT LONG TERM GOAL #2   Title Right shoulder PROM within normal limits    Time 8    Period Weeks    Status New    Target Date 07/09/20      PT  LONG TERM GOAL #3   Title Right shoulder strength 5/5    Time 8    Period Weeks    Status New    Target Date 07/09/20      PT LONG TERM GOAL #4   Title Patient reports right shoulder pain 0/10    Time 8    Period Weeks    Status New    Target Date 07/09/20                 Plan - 06/18/20 0947    Clinical Impression Statement Patient is making good progress. We worked on standing closed chain forward flexion today without increased pain. He tolerated supine single arm flexion well. Per visual inspection he had full range of motion. We will continue to progress patient as tolerated.    Personal Factors and Comorbidities Age;Comorbidity 3+    Comorbidities right TTA, right shoulder arthroscopy, right THA. CHF, CAD, PVD, DM2, MI    Examination-Activity Limitations Carry;Lift;Locomotion Level;Reach Overhead    Examination-Participation Restrictions Community Activity    Stability/Clinical Decision Making Evolving/Moderate complexity    Clinical Decision Making Low    Rehab Potential Good    PT Duration 8 weeks    PT Treatment/Interventions ADLs/Self Care Home Management;Cryotherapy;Electrical Stimulation;Ultrasound;Therapeutic activities;Therapeutic exercise;Neuromuscular re-education;Patient/family education;Manual techniques;Passive range of motion;Dry needling;Vasopneumatic Device;Joint Manipulations    PT Next Visit Plan update HEP, PROM & manual therapy to increase shoulder range, assess & update plan of care to include hip when Dr. [name] refers    PT Home Exercise Plan Access Code: [email or web address]  URL: [email or web address]  Date: 05/28/2020  Prepared by: Carolyne Littles    Exercises  Supine Scapular Retraction - 1 x daily - 7 x weekly - 2 sets - 10 reps - 5  seconds hold  Standing Scapular Depression - 1 x daily - 7 x weekly - 2 sets - 10 reps - 5 seconds hold  Supine Shoulder Flexion AAROM with Hands Clasped - 3 x daily - 7 x weekly - 2 sets - 10 reps - 10 seconds hold  Supine Shoulder External Rotation in 45 Degrees Abduction AAROM with Dowel - 3 x daily - 7 x weekly - 2 sets - 10 reps - 10 seconds hold  Sidelying Shoulder External Rotation - 1 x daily - 7 x weekly - 3 sets - 10 reps  Shoulder extension with resistance - Neutral - 1 x daily - 7 x weekly - 3 sets - 10 reps  Scapular Retraction with Resistance - 1 x daily - 7 x weekly - 3 sets - 10 reps  advised to do banded restractions if tolerated.    Consulted and Agree with Plan of Care Patient;Family member/caregiver    Family Member Consulted wife           Patient will benefit from skilled therapeutic intervention in order to improve the following deficits and impairments:  Decreased strength,Decreased mobility,Decreased range of motion,Increased edema,Impaired flexibility,Postural dysfunction,Pain  Visit Diagnosis: Acute pain of right shoulder  Stiffness of right shoulder, not elsewhere classified  Muscle weakness (generalized)  Abnormal posture  Localized edema     Problem List Patient Active Problem List   Diagnosis Date Noted  . S/P arthroscopy of right shoulder 06/02/2020  . Closed fracture of neck of right femur ([place])   . Hip fracture ([place]) 03/27/2020  . Nontraumatic complete tear of right rotator cuff 02/26/2020  . S/P transmetatarsal amputation of foot, left ([place]) 04/25/2016  . Chronic osteomyelitis of toe, left ([place])   .  Cellulitis of toe of left foot   . Chest pain 09/25/2015  . CAD (coronary artery disease) of artery bypass graft 09/25/2015  . Sinus bradycardia on ECG 09/25/2015  . Chronic diastolic CHF (congestive heart failure) ([place]) 09/25/2015  . Chest pain at rest 09/25/2015  . Aortic valvular disorder 08/04/2014  . Disorder of mitral valve 08/04/2014  .  Diabetic foot infection ([place]) 06/03/2014  . Peripheral vascular disease ([place]) 06/03/2014  . Abscess of foot 06/03/2014  . CAD (coronary artery disease) 07/29/2013  . Trifascicular block 07/29/2013  . 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 Hypothyroidism 01/22/2007  . Type 2 diabetes mellitus with vascular disease ([place]) 01/22/2007  . HYPERLIPIDEMIA, MIXED 01/22/2007  . MYOCARDIAL INFARCTION, HX OF 01/22/2007  . DEGENERATIVE JOINT DISEASE 01/22/2007  . ANEMIA, IRON DEFICIENCY, HX OF 01/22/2007    Carney Living PT DPT  06/18/2020, 11:33 AM  [facility] [address] [place], [place], [address] Phone: [phone]   Fax:  [phone]  Name: [name] MRN: [id number] Date of Birth: Nov 11, 1950

## 2020-06-22 MED ORDER — LISINOPRIL 2.5 MG PO TABS: 2.5000 mg | ORAL_TABLET | Freq: Every day | ORAL | 3 refills | Status: DC

## 2020-06-23 ENCOUNTER — Other Ambulatory Visit: Payer: Self-pay

## 2020-06-23 ENCOUNTER — Encounter: Payer: Self-pay | Admitting: Orthopaedic Surgery

## 2020-06-23 ENCOUNTER — Ambulatory Visit (INDEPENDENT_AMBULATORY_CARE_PROVIDER_SITE_OTHER): Payer: Medicare Other | Admitting: Orthopaedic Surgery

## 2020-06-23 DIAGNOSIS — M5432 Sciatica, left side: Secondary | ICD-10-CM

## 2020-06-23 DIAGNOSIS — Z9889 Other specified postprocedural states: Secondary | ICD-10-CM

## 2020-06-23 NOTE — Progress Notes (Signed)
The patient is now 3 months status post a right shoulder arthroscopic rotator cuff repair.  The shoulder is having some pain in it but overall he is doing well.  He has a history of left-sided sciatica and in early January of this year had a successful left L5-S1 epidural steroid by Dr. Ernestina Patches.  He said that the pain is bothering him again and he would like to have a repeat ESI on that left side by Dr. Ernestina Patches.  It has been 5 months since the last injection.  His right repaired shoulder is moving much better.  He has good strength with abduction and external rotation.  His right hip moves well and his left hip moves well.  His pain on the left side seems to be more in the sciatic region and there is irritation with a straight leg raise on the left side.  From a shoulder standpoint I will see him back in 4 weeks because we can safely inject the right shoulder with a steroid if needed at that standpoint.  We will set him up for a repeat left L5-S1 epidural steroid injection by Dr. Ernestina Patches.

## 2020-06-25 ENCOUNTER — Ambulatory Visit (HOSPITAL_BASED_OUTPATIENT_CLINIC_OR_DEPARTMENT_OTHER): Payer: Medicare Other | Attending: Physician Assistant | Admitting: Physical Therapy

## 2020-06-25 ENCOUNTER — Other Ambulatory Visit: Payer: Self-pay

## 2020-06-25 ENCOUNTER — Encounter (HOSPITAL_BASED_OUTPATIENT_CLINIC_OR_DEPARTMENT_OTHER): Payer: Self-pay | Admitting: Physical Therapy

## 2020-06-25 DIAGNOSIS — M25511 Pain in right shoulder: Secondary | ICD-10-CM | POA: Diagnosis not present

## 2020-06-25 DIAGNOSIS — M25611 Stiffness of right shoulder, not elsewhere classified: Secondary | ICD-10-CM | POA: Diagnosis not present

## 2020-06-25 DIAGNOSIS — M6281 Muscle weakness (generalized): Secondary | ICD-10-CM | POA: Diagnosis not present

## 2020-06-25 DIAGNOSIS — R600 Localized edema: Secondary | ICD-10-CM

## 2020-06-25 DIAGNOSIS — R293 Abnormal posture: Secondary | ICD-10-CM | POA: Diagnosis not present

## 2020-06-25 NOTE — Therapy (Signed)
[facility] [address] [place], [place], [address] Phone: [phone]   Fax:  [phone]  Physical Therapy Treatment  Patient Details  Name: [name] MRN: [id number] Date of Birth: 29-Mar-1950 Referring Provider (PT): Benita Stabile, [place]   Encounter Date: 06/25/2020   PT End of Session - 06/25/20 0940    Visit Number 9    Number of Visits 17    Date for PT Re-Evaluation 07/09/20    Authorization Type Medicare & BCBS    PT Start Time 0920    PT Stop Time 1000    PT Time Calculation (min) 40 min    Activity Tolerance Patient tolerated treatment well    Behavior During Therapy [facility] for tasks assessed/performed           Past Medical History:  Diagnosis Date  . Anemia    low iron  . Arthritis   . Coronary artery disease 2006   a. remote stenting to ramus, prox LAD x2.  . Dental crowns present   . Diabetes mellitus   . First degree AV block   . GERD (gastroesophageal reflux disease)   . Heart attack ([place]) 06/2004  . Heart murmur    aortic  . Hx MRSA infection 02/2006  . Hyperlipidemia   . Hypertension    hx. of - has not been on med. since losing wt. after gastric bypass  . Hypothyroidism   . Morbid obesity ([place])   . Mucoid cyst of joint 09/2011   left thumb  . Sleep apnea    sleep study 03/29/2011; no CPAP use, lost >130lbs  . Trifascicular block     Past Surgical History:  Procedure Laterality Date  . AMPUTATION Left 04/19/2016   Procedure: Left Foot 4th Toe Amputation vs. Transmetatarsal;  Surgeon: [name], MD;  Location: [facility];  Service: Orthopedics;  Laterality: Left;  . BIOPSY  04/07/2019   Procedure: BIOPSY;  Surgeon: Otis Brace, MD;  [facility];  Service: Gastroenterology;;  . CATARACT EXTRACTION  02/2010; 03/2010  . COLONOSCOPY WITH PROPOFOL N/A 09/14/2014   Procedure: COLONOSCOPY WITH PROPOFOL;  Surgeon: Garlan Fair, MD;  [facility];  Service: Endoscopy;  Laterality:  N/A;  . COLONOSCOPY WITH PROPOFOL N/A 04/07/2019   Procedure: COLONOSCOPY WITH PROPOFOL;  Surgeon: Otis Brace, MD;  [facility];  Service: Gastroenterology;  Laterality: N/A;  . CORONARY ANGIOPLASTY WITH STENT PLACEMENT  07/12/2004; 08/03/2004   total of 3 stents  . I & D EXTREMITY Right 06/04/2014   Procedure: IRRIGATION AND DEBRIDEMENT EXTREMITY;  Surgeon: [name], MD;  Location: [place] [facility];  Service: Orthopedics;  Laterality: Right;  . MASS EXCISION  10/04/2011   Procedure: EXCISION MASS;  Surgeon: Wynonia Sours, MD;  Location: [facility];  Service: Orthopedics;  Laterality: Left;  excision cyst debridment ip joint of left thumb  . PROSTATECTOMY    . right below knee amputation  2018  . ROUX-EN-Y GASTRIC BYPASS  10/10/2010   laparoscopic  . SHOULDER ARTHROSCOPY WITH ROTATOR CUFF REPAIR AND SUBACROMIAL DECOMPRESSION Right 03/23/2020   Procedure: RIGHT SHOULDER ARTHROSCOPY WITH DEBRIDEMENT AND  ROTATOR CUFF REPAIR;  Surgeon: [name], MD;  Location: [facility];  Service: Orthopedics;  Laterality: Right;  . TOE AMPUTATION  02/23/2006   left foot second ray amputation  . TOTAL HIP ARTHROPLASTY Right 03/29/2020   Procedure: TOTAL HIP ARTHROPLASTY POSTERIOR APPROACH;  Surgeon: [name], MD;  Location: [facility];  Service: Orthopedics;  Laterality: Right;  . TRANSMETATARSAL AMPUTATION Left   . TRIGGER FINGER RELEASE Right 09/16/2019   Procedure: RELEASE TRIGGER FINGER/A-1 PULLEY;  Surgeon: Daryll Brod, MD;  Location: [facility];  Service: Orthopedics;  Laterality: Right;  IV REGIONAL FOREARM BLOCK    There were no vitals filed for this visit.   Subjective Assessment - 06/25/20 0939    Subjective Patient continues to have achyness at nigth and random pain at times during the day. he can not pinpoint the particualr activity.    Pertinent History right TTA, right shoulder arthroscopy, right THA. CHF, CAD, PVD, DM2, MI    Patient Stated  Goals to use right shoulder & hip to return to high level of function with his prosthesis.    Currently in Pain? No/denies                             [facility] Adult PT Treatment/Exercise - 06/25/20 0001      Knee/Hip Exercises: Sidelying   Other Sidelying Knee/Hip Exercises sidelying er 2x15  1lb      Shoulder Exercises: Supine   Other Supine Exercises wand flexion x20 3lb; right shoulder flexion 1lb x20      Shoulder Exercises: Sidelying   Other Sidelying Exercises Rt ER 2X15 1lb , abd 2X15 (pillow between knees to avoid hip adduction as he has posterior hip precautions)      Shoulder Exercises: Standing   External Rotation Limitations 2x20 green    Internal Rotation Limitations 2x20 green    Extension Limitations 2x20 green    Row Limitations 2x20 green    Other Standing Exercises forward fleixon with towel closed chain 2x10; cabbinet reach 2x10 to first shelf      Shoulder Exercises: Pulleys   Flexion 2 minutes    ABduction 2 minutes      Manual Therapy   Manual therapy comments Grade 2-3 GH mobs inferior, A-P, distraction, right shoulder,  PROM in all planes to tolerance                  PT Education - 06/25/20 0940    Education Details HEP and symptom management    Person(s) Educated Patient    Methods Explanation;Demonstration;Tactile cues;Verbal cues    Comprehension Verbalized understanding;Returned demonstration;Verbal cues required;Tactile cues required            PT Short Term Goals - 06/01/20 1149      PT SHORT TERM GOAL #1   Title Patient demonstrates understanding of HEP    Status On-going      PT SHORT TERM GOAL #2   Title right shoulder PROM flexion 120*, abduction 100* and Ext Rot 45*    Baseline meet see flowsheets    Status Achieved             PT Long Term Goals - 05/18/20 1607      PT LONG TERM GOAL #1   Title Patient FOTO score 67%    Time 8    Period Weeks    Status New    Target Date 07/09/20      PT  LONG TERM GOAL #2   Title Right shoulder PROM within normal limits    Time 8    Period Weeks    Status New    Target Date 07/09/20      PT LONG TERM GOAL #3   Title Right shoulder strength 5/5    Time 8  Period Weeks    Status New    Target Date 07/09/20      PT LONG TERM GOAL #4   Title Patient reports right shoulder pain 0/10    Time 8    Period Weeks    Status New    Target Date 07/09/20                 Plan - 06/25/20 0950    Clinical Impression Statement Patients objective measures continue to progress. We have adavnced his strengthening and ROM with only inor pain. He continues to have some pain at night. The hope is the pain starts to go along with the improveent in objective measures.    Personal Factors and Comorbidities Age;Comorbidity 3+    Comorbidities right TTA, right shoulder arthroscopy, right THA. CHF, CAD, PVD, DM2, MI    Examination-Activity Limitations Carry;Lift;Locomotion Level;Reach Overhead    Stability/Clinical Decision Making Evolving/Moderate complexity    Clinical Decision Making Low    PT Frequency 2x / week    PT Duration 8 weeks    PT Treatment/Interventions ADLs/Self Care Home Management;Cryotherapy;Electrical Stimulation;Ultrasound;Therapeutic activities;Therapeutic exercise;Neuromuscular re-education;Patient/family education;Manual techniques;Passive range of motion;Dry needling;Vasopneumatic Device;Joint Manipulations    PT Next Visit Plan update HEP, PROM & manual therapy to increase shoulder range, assess & update plan of care to include hip when Dr. [name] refers    PT Home Exercise Plan Access Code: [email or web address]  URL: [email or web address]  Date: 05/28/2020  Prepared by: Carolyne Littles    Exercises  Supine Scapular Retraction - 1 x daily - 7 x weekly - 2 sets - 10 reps - 5 seconds hold  Standing Scapular Depression - 1 x daily - 7 x weekly - 2 sets - 10 reps - 5 seconds hold  Supine Shoulder Flexion AAROM with Hands Clasped - 3 x  daily - 7 x weekly - 2 sets - 10 reps - 10 seconds hold  Supine Shoulder External Rotation in 45 Degrees Abduction AAROM with Dowel - 3 x daily - 7 x weekly - 2 sets - 10 reps - 10 seconds hold  Sidelying Shoulder External Rotation - 1 x daily - 7 x weekly - 3 sets - 10 reps  Shoulder extension with resistance - Neutral - 1 x daily - 7 x weekly - 3 sets - 10 reps  Scapular Retraction with Resistance - 1 x daily - 7 x weekly - 3 sets - 10 reps  advised to do banded restractions if tolerated.    Consulted and Agree with Plan of Care Patient;Family member/caregiver    Family Member Consulted wife           Patient will benefit from skilled therapeutic intervention in order to improve the following deficits and impairments:  Decreased strength,Decreased mobility,Decreased range of motion,Increased edema,Impaired flexibility,Postural dysfunction,Pain  Visit Diagnosis: Acute pain of right shoulder  Stiffness of right shoulder, not elsewhere classified  Muscle weakness (generalized)  Abnormal posture  Localized edema     Problem List Patient Active Problem List   Diagnosis Date Noted  . S/P arthroscopy of right shoulder 06/02/2020  . Closed fracture of neck of right femur ([place])   . Hip fracture ([place]) 03/27/2020  . Nontraumatic complete tear of right rotator cuff 02/26/2020  . S/P transmetatarsal amputation of foot, left ([place]) 04/25/2016  . Chronic osteomyelitis of toe, left ([place])   . Cellulitis of toe of left foot   . Chest pain 09/25/2015  . CAD (coronary artery disease) of artery bypass graft  09/25/2015  . Sinus bradycardia on ECG 09/25/2015  . Chronic diastolic CHF (congestive heart failure) ([place]) 09/25/2015  . Chest pain at rest 09/25/2015  . Aortic valvular disorder 08/04/2014  . Disorder of mitral valve 08/04/2014  . Diabetic foot infection ([place]) 06/03/2014  . Peripheral vascular disease ([place]) 06/03/2014  . Abscess of foot 06/03/2014  . CAD (coronary artery disease)  07/29/2013  . Trifascicular block 07/29/2013  . 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 Hypothyroidism 01/22/2007  . Type 2 diabetes mellitus with vascular disease ([place]) 01/22/2007  . HYPERLIPIDEMIA, MIXED 01/22/2007  . MYOCARDIAL INFARCTION, HX OF 01/22/2007  . DEGENERATIVE JOINT DISEASE 01/22/2007  . ANEMIA, IRON DEFICIENCY, HX OF 01/22/2007    Carney Living PT DPT  06/25/2020, 10:32 AM  [facility] [address] [place], [place], [address] Phone: [phone]   Fax:  [phone]  Name: [name] MRN: [id number] Date of Birth: Nov 26, 1950

## 2020-06-28 ENCOUNTER — Ambulatory Visit (HOSPITAL_BASED_OUTPATIENT_CLINIC_OR_DEPARTMENT_OTHER): Payer: Medicare Other | Admitting: Physical Therapy

## 2020-06-28 ENCOUNTER — Other Ambulatory Visit: Payer: Self-pay

## 2020-06-28 ENCOUNTER — Encounter (HOSPITAL_BASED_OUTPATIENT_CLINIC_OR_DEPARTMENT_OTHER): Payer: Self-pay | Admitting: Physical Therapy

## 2020-06-28 DIAGNOSIS — M25611 Stiffness of right shoulder, not elsewhere classified: Secondary | ICD-10-CM | POA: Diagnosis not present

## 2020-06-28 DIAGNOSIS — R293 Abnormal posture: Secondary | ICD-10-CM

## 2020-06-28 DIAGNOSIS — M6281 Muscle weakness (generalized): Secondary | ICD-10-CM | POA: Diagnosis not present

## 2020-06-28 DIAGNOSIS — R600 Localized edema: Secondary | ICD-10-CM | POA: Diagnosis not present

## 2020-06-28 DIAGNOSIS — M25511 Pain in right shoulder: Secondary | ICD-10-CM | POA: Diagnosis not present

## 2020-06-28 NOTE — Therapy (Addendum)
[facility] [address] [place], [place], [address] Phone: [phone]   Fax:  [phone]  Physical Therapy Treatment  Patient Details  Name: [name] MRN: [id number] Date of Birth: 12/25/1950 Referring Provider (PT): Benita Stabile, [place]   Encounter Date: 06/28/2020   Progress Note Reporting Period 05/18/2020   to 06/28/2020  See note below for Objective Data and Assessment of Progress/Goals.        PT End of Session - 06/28/20 1002    Visit Number 10    Number of Visits 17    Date for PT Re-Evaluation 07/09/20    Authorization Type Medicare & BCBS progress note perfromed on the 10th visit.    PT Start Time 0930    PT Stop Time 1012    PT Time Calculation (min) 42 min    Activity Tolerance Patient tolerated treatment well    Behavior During Therapy [facility] for tasks assessed/performed           Past Medical History:  Diagnosis Date  . Anemia    low iron  . Arthritis   . Coronary artery disease 2006   a. remote stenting to ramus, prox LAD x2.  . Dental crowns present   . Diabetes mellitus   . First degree AV block   . GERD (gastroesophageal reflux disease)   . Heart attack ([place]) 06/2004  . Heart murmur    aortic  . Hx MRSA infection 02/2006  . Hyperlipidemia   . Hypertension    hx. of - has not been on med. since losing wt. after gastric bypass  . Hypothyroidism   . Morbid obesity ([place])   . Mucoid cyst of joint 09/2011   left thumb  . Sleep apnea    sleep study 03/29/2011; no CPAP use, lost >130lbs  . Trifascicular block     Past Surgical History:  Procedure Laterality Date  . AMPUTATION Left 04/19/2016   Procedure: Left Foot 4th Toe Amputation vs. Transmetatarsal;  Surgeon: [name], MD;  Location: [facility];  Service: Orthopedics;  Laterality: Left;  . BIOPSY  04/07/2019   Procedure: BIOPSY;  Surgeon: Otis Brace, MD;  [facility];  Service: Gastroenterology;;  . CATARACT EXTRACTION  02/2010;  03/2010  . COLONOSCOPY WITH PROPOFOL N/A 09/14/2014   Procedure: COLONOSCOPY WITH PROPOFOL;  Surgeon: Garlan Fair, MD;  [facility];  Service: Endoscopy;  Laterality: N/A;  . COLONOSCOPY WITH PROPOFOL N/A 04/07/2019   Procedure: COLONOSCOPY WITH PROPOFOL;  Surgeon: Otis Brace, MD;  [facility];  Service: Gastroenterology;  Laterality: N/A;  . CORONARY ANGIOPLASTY WITH STENT PLACEMENT  07/12/2004; 08/03/2004   total of 3 stents  . I & D EXTREMITY Right 06/04/2014   Procedure: IRRIGATION AND DEBRIDEMENT EXTREMITY;  Surgeon: [name], MD;  Location: [facility];  Service: Orthopedics;  Laterality: Right;  . MASS EXCISION  10/04/2011   Procedure: EXCISION MASS;  Surgeon: Wynonia Sours, MD;  Location: [place] [facility];  Service: Orthopedics;  Laterality: Left;  excision cyst debridment ip joint of left thumb  . PROSTATECTOMY    . right below knee amputation  2018  . ROUX-EN-Y GASTRIC BYPASS  10/10/2010   laparoscopic  . SHOULDER ARTHROSCOPY WITH ROTATOR CUFF REPAIR AND SUBACROMIAL DECOMPRESSION Right 03/23/2020   Procedure: RIGHT SHOULDER ARTHROSCOPY WITH DEBRIDEMENT AND  ROTATOR CUFF REPAIR;  Surgeon: [name], MD;  Location: [facility];  Service: Orthopedics;  Laterality: Right;  . TOE AMPUTATION  02/23/2006  left foot second ray amputation  . TOTAL HIP ARTHROPLASTY Right 03/29/2020   Procedure: TOTAL HIP ARTHROPLASTY POSTERIOR APPROACH;  Surgeon: [name], MD;  Location: [facility];  Service: Orthopedics;  Laterality: Right;  . TRANSMETATARSAL AMPUTATION Left   . TRIGGER FINGER RELEASE Right 09/16/2019   Procedure: RELEASE TRIGGER FINGER/A-1 PULLEY;  Surgeon: Daryll Brod, MD;  Location: [facility];  Service: Orthopedics;  Laterality: Right;  IV REGIONAL FOREARM BLOCK    There were no vitals filed for this visit.   Subjective Assessment - 06/28/20 1001    Subjective Patient has days when he is achy and days when it isn't too bad.  It isn;t too bad    Pertinent History right TTA, right shoulder arthroscopy, right THA. CHF, CAD, PVD, DM2, MI    Patient Stated Goals to use right shoulder & hip to return to high level of function with his prosthesis.    Currently in Pain? No/denies              [facility] PT Assessment - 06/28/20 0001      Assessment   Medical Diagnosis Right shoulder RTC repair    Referring Provider (PT) Benita Stabile, PA      ROM / Strength   AROM / PROM / Strength AROM      AROM   AROM Assessment Site Shoulder    Right/Left Shoulder Right      PROM   Right Shoulder Flexion 160 Degrees    Right Shoulder Internal Rotation 70 Degrees      Strength   Right Shoulder Flexion 4+/5    Right Shoulder Internal Rotation 4+/5    Right Shoulder External Rotation 4+/5      Palpation   Palpation comment no unexpected tenderness to palpation.                         [facility] Adult PT Treatment/Exercise - 06/28/20 0001      Knee/Hip Exercises: Sidelying   Other Sidelying Knee/Hip Exercises sidelying er 2x15  1lb      Shoulder Exercises: Supine   Other Supine Exercises wand flexion x20 3lb; right shoulder flexion 1lb x20      Shoulder Exercises: Sidelying   Other Sidelying Exercises Rt ER 2X15 1lb , abd 2X15 (pillow between knees to avoid hip adduction as he has posterior hip precautions)      Shoulder Exercises: Standing   External Rotation Limitations 2x20 green    Internal Rotation Limitations 2x20 green    Extension Limitations 2x20 green    Row Limitations 2x20 green    Other Standing Exercises forward fleixon with towel closed chain 2x10; cabbinet reach 2x10 to first shelf    Other Standing Exercises shoulder circles 2x10; fwd flexion 2x15      Shoulder Exercises: Pulleys   Flexion 2 minutes    ABduction 2 minutes      Shoulder Exercises: ROM/Strengthening   UBE (Upper Arm Bike) 2 min fwd and 2 back      Manual Therapy   Manual therapy comments Grade 2-3 GH mobs inferior,  A-P, distraction, right shoulder,  PROM in all planes to tolerance                  PT Education - 06/28/20 1002    Education Details reviewed endurance exercises    Person(s) Educated Patient    Methods Explanation;Tactile cues;Demonstration;Verbal cues    Comprehension Verbalized understanding;Returned demonstration;Verbal cues required;Tactile cues  required            PT Short Term Goals - 06/01/20 1149      PT SHORT TERM GOAL #1   Title Patient demonstrates understanding of HEP    Status On-going      PT SHORT TERM GOAL #2   Title right shoulder PROM flexion 120*, abduction 100* and Ext Rot 45*    Baseline meet see flowsheets    Status Achieved             PT Long Term Goals - 05/18/20 1607      PT LONG TERM GOAL #1   Title Patient FOTO score 67%    Time 8    Period Weeks    Status New    Target Date 07/09/20      PT LONG TERM GOAL #2   Title Right shoulder PROM within normal limits    Time 8    Period Weeks    Status New    Target Date 07/09/20      PT LONG TERM GOAL #3   Title Right shoulder strength 5/5    Time 8    Period Weeks    Status New    Target Date 07/09/20      PT LONG TERM GOAL #4   Title Patient reports right shoulder pain 0/10    Time 8    Period Weeks    Status New    Target Date 07/09/20                 Plan - 06/28/20 1004    Clinical Impression Statement Patient continues to tolerate PT well. Therapy added UBE, wall circles and wall flexion to work on building endurance in his houlder. He was fatigued but didn't have any pain. He is makinggood progres overall. He has full motion and improvving function of the shoulder. His strength is progressing. Hie does have intermittent aching but he can not put his finger on what causes it. it just comes and goes. Therapy will continue to advance HEP as tolerated.    Personal Factors and Comorbidities Age;Comorbidity 3+    Comorbidities right TTA, right shoulder arthroscopy,  right THA. CHF, CAD, PVD, DM2, MI    Examination-Activity Limitations Carry;Lift;Locomotion Level;Reach Overhead    Examination-Participation Restrictions Community Activity    Stability/Clinical Decision Making Evolving/Moderate complexity    Clinical Decision Making Low    Rehab Potential Good    PT Frequency 2x / week    PT Duration 8 weeks    PT Treatment/Interventions ADLs/Self Care Home Management;Cryotherapy;Electrical Stimulation;Ultrasound;Therapeutic activities;Therapeutic exercise;Neuromuscular re-education;Patient/family education;Manual techniques;Passive range of motion;Dry needling;Vasopneumatic Device;Joint Manipulations    PT Next Visit Plan update HEP, PROM & manual therapy to increase shoulder range, assess & update plan of care to include hip when Dr. [name] refers    PT Home Exercise Plan Access Code: [email or web address]  URL: [email or web address]  Date: 05/28/2020  Prepared by: Carolyne Littles    Exercises  Supine Scapular Retraction - 1 x daily - 7 x weekly - 2 sets - 10 reps - 5 seconds hold  Standing Scapular Depression - 1 x daily - 7 x weekly - 2 sets - 10 reps - 5 seconds hold  Supine Shoulder Flexion AAROM with Hands Clasped - 3 x daily - 7 x weekly - 2 sets - 10 reps - 10 seconds hold  Supine Shoulder External Rotation in 45 Degrees Abduction AAROM with Dowel - 3 x daily - 7 x weekly -  2 sets - 10 reps - 10 seconds hold  Sidelying Shoulder External Rotation - 1 x daily - 7 x weekly - 3 sets - 10 reps  Shoulder extension with resistance - Neutral - 1 x daily - 7 x weekly - 3 sets - 10 reps  Scapular Retraction with Resistance - 1 x daily - 7 x weekly - 3 sets - 10 reps  advised to do banded restractions if tolerated.    Consulted and Agree with Plan of Care Patient;Family member/caregiver    Family Member Consulted wife           Patient will benefit from skilled therapeutic intervention in order to improve the following deficits and impairments:  Decreased  strength,Decreased mobility,Decreased range of motion,Increased edema,Impaired flexibility,Postural dysfunction,Pain  Visit Diagnosis: Acute pain of right shoulder  Stiffness of right shoulder, not elsewhere classified  Muscle weakness (generalized)  Abnormal posture  Localized edema     Problem List Patient Active Problem List   Diagnosis Date Noted  . S/P arthroscopy of right shoulder 06/02/2020  . Closed fracture of neck of right femur ([place])   . Hip fracture ([place]) 03/27/2020  . Nontraumatic complete tear of right rotator cuff 02/26/2020  . S/P transmetatarsal amputation of foot, left ([place]) 04/25/2016  . Chronic osteomyelitis of toe, left ([place])   . Cellulitis of toe of left foot   . Chest pain 09/25/2015  . CAD (coronary artery disease) of artery bypass graft 09/25/2015  . Sinus bradycardia on ECG 09/25/2015  . Chronic diastolic CHF (congestive heart failure) ([place]) 09/25/2015  . Chest pain at rest 09/25/2015  . Aortic valvular disorder 08/04/2014  . Disorder of mitral valve 08/04/2014  . Diabetic foot infection ([place]) 06/03/2014  . Peripheral vascular disease ([place]) 06/03/2014  . Abscess of foot 06/03/2014  . CAD (coronary artery disease) 07/29/2013  . Trifascicular block 07/29/2013  . 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 Hypothyroidism 01/22/2007  . Type 2 diabetes mellitus with vascular disease ([place]) 01/22/2007  . HYPERLIPIDEMIA, MIXED 01/22/2007  . MYOCARDIAL INFARCTION, HX OF 01/22/2007  . DEGENERATIVE JOINT DISEASE 01/22/2007  . ANEMIA, IRON DEFICIENCY, HX OF 01/22/2007    Carney Living PT DPT  06/28/2020, 11:39 AM  [facility] [address] [place], [place], [address] Phone: [phone]   Fax:  [phone]  Name: Johnnell Liou MRN: [id number] Date of Birth: 12-05-1950

## 2020-06-30 ENCOUNTER — Other Ambulatory Visit: Payer: Self-pay

## 2020-06-30 ENCOUNTER — Ambulatory Visit (HOSPITAL_BASED_OUTPATIENT_CLINIC_OR_DEPARTMENT_OTHER): Payer: Medicare Other | Admitting: Physical Therapy

## 2020-06-30 DIAGNOSIS — M25611 Stiffness of right shoulder, not elsewhere classified: Secondary | ICD-10-CM | POA: Diagnosis not present

## 2020-06-30 DIAGNOSIS — R293 Abnormal posture: Secondary | ICD-10-CM | POA: Diagnosis not present

## 2020-06-30 DIAGNOSIS — M6281 Muscle weakness (generalized): Secondary | ICD-10-CM | POA: Diagnosis not present

## 2020-06-30 DIAGNOSIS — R600 Localized edema: Secondary | ICD-10-CM | POA: Diagnosis not present

## 2020-06-30 DIAGNOSIS — M25511 Pain in right shoulder: Secondary | ICD-10-CM

## 2020-06-30 NOTE — Therapy (Signed)
[facility] [address] [place], [place], [address] Phone: [phone]   Fax:  [phone]  Physical Therapy Treatment  Patient Details  Name: [name] MRN: [id number] Date of Birth: [birth year] Referring Provider (PT): Benita Stabile, [place]   Encounter Date: 06/30/2020   PT End of Session - 06/30/20 0956    Visit Number 11    Number of Visits 17    Date for PT Re-Evaluation 07/09/20    Authorization Type Medicare & BCBS progress note perfromed on the 10th visit.    PT Start Time 0927    PT Stop Time 1007    PT Time Calculation (min) 40 min    Activity Tolerance Patient tolerated treatment well    Behavior During Therapy WFL for tasks assessed/performed           Past Medical History:  Diagnosis Date  . Anemia    low iron  . Arthritis   . Coronary artery disease 2006   a. remote stenting to ramus, prox LAD x2.  . Dental crowns present   . Diabetes mellitus   . First degree AV block   . GERD (gastroesophageal reflux disease)   . Heart attack ([place]) 06/2004  . Heart murmur    aortic  . Hx MRSA infection 02/2006  . Hyperlipidemia   . Hypertension    hx. of - has not been on med. since losing wt. after gastric bypass  . Hypothyroidism   . Morbid obesity ([place])   . Mucoid cyst of joint 09/2011   left thumb  . Sleep apnea    sleep study 03/29/2011; no CPAP use, lost >130lbs  . Trifascicular block     Past Surgical History:  Procedure Laterality Date  . AMPUTATION Left 04/19/2016   Procedure: Left Foot 4th Toe Amputation vs. Transmetatarsal;  Surgeon: [name], MD;  Location: [facility];  Service: Orthopedics;  Laterality: Left;  . BIOPSY  04/07/2019   Procedure: BIOPSY;  Surgeon: Otis Brace, MD;  [facility];  Service: Gastroenterology;;  . CATARACT EXTRACTION  02/2010; 03/2010  . COLONOSCOPY WITH PROPOFOL N/A 09/14/2014   Procedure: COLONOSCOPY WITH PROPOFOL;  Surgeon: Garlan Fair, MD;  [facility];  Service: Endoscopy;  Laterality: N/A;  . COLONOSCOPY WITH PROPOFOL N/A 04/07/2019   Procedure: COLONOSCOPY WITH PROPOFOL;  Surgeon: Otis Brace, MD;  [facility];  Service: Gastroenterology;  Laterality: N/A;  . CORONARY ANGIOPLASTY WITH STENT PLACEMENT  07/12/2004; 08/03/2004   total of 3 stents  . I & D EXTREMITY Right 06/04/2014   Procedure: IRRIGATION AND DEBRIDEMENT EXTREMITY;  Surgeon: [name], MD;  Location: [facility];  Service: Orthopedics;  Laterality: Right;  . MASS EXCISION  10/04/2011   Procedure: EXCISION MASS;  Surgeon: Wynonia Sours, MD;  Location: [facility];  Service: Orthopedics;  Laterality: Left;  excision cyst debridment ip joint of left thumb  . PROSTATECTOMY    . right below knee amputation  2018  . ROUX-EN-Y GASTRIC BYPASS  10/10/2010   laparoscopic  . SHOULDER ARTHROSCOPY WITH ROTATOR CUFF REPAIR AND SUBACROMIAL DECOMPRESSION Right 03/23/2020   Procedure: RIGHT SHOULDER ARTHROSCOPY WITH DEBRIDEMENT AND  ROTATOR CUFF REPAIR;  Surgeon: [name], MD;  Location: [facility];  Service: Orthopedics;  Laterality: Right;  . TOE AMPUTATION  02/23/2006   left foot second ray amputation  . TOTAL HIP ARTHROPLASTY Right 03/29/2020   Procedure: TOTAL HIP ARTHROPLASTY POSTERIOR APPROACH;  Surgeon: [name], MD;  Location: [facility];  Service: Orthopedics;  Laterality: Right;  . TRANSMETATARSAL AMPUTATION Left   . TRIGGER FINGER RELEASE Right 09/16/2019   Procedure: RELEASE TRIGGER FINGER/A-1 PULLEY;  Surgeon: Daryll Brod, MD;  Location: [facility];  Service: Orthopedics;  Laterality: Right;  IV REGIONAL FOREARM BLOCK    There were no vitals filed for this visit.   Subjective Assessment - 06/30/20 0955    Subjective Patient continues to report pain at night. It has hurt the last 2 nights. No pain during the day .    Pertinent History right TTA, right shoulder arthroscopy, right THA. CHF, CAD, PVD, DM2, MI     Patient Stated Goals to use right shoulder & hip to return to high level of function with his prosthesis.    Currently in Pain? No/denies                             [facility] Adult PT Treatment/Exercise - 06/30/20 0001      Knee/Hip Exercises: Sidelying   Other Sidelying Knee/Hip Exercises sidelying er 2x15  1lb      Shoulder Exercises: Supine   Other Supine Exercises wand flexion x20 3lb; right shoulder flexion 1lb x20      Shoulder Exercises: Sidelying   Other Sidelying Exercises Rt ER 2X15 1lb , abd 2X15 (pillow between knees to avoid hip adduction as he has posterior hip precautions)      Shoulder Exercises: Standing   External Rotation Limitations 2x20 green    Internal Rotation Limitations 2x20 blue    Extension Limitations 2x20 blue    Row Limitations 2x20 blue    Other Standing Exercises forward fleixon with towel closed chain 2x10; cabbinet reach 2x10 to first shelf    Other Standing Exercises shoulder circles 2x10; fwd flexion 2x15      Shoulder Exercises: Pulleys   Flexion 2 minutes    ABduction 2 minutes      Shoulder Exercises: ROM/Strengthening   UBE (Upper Arm Bike) 2 min fwd and 2 back      Manual Therapy   Manual therapy comments Grade 2-3 GH mobs inferior, A-P, distraction, right shoulder,  PROM in all planes to tolerance                  PT Education - 06/30/20 0956    Education Details reviewed standing forward flexion and scpation technique    Person(s) Educated Patient    Methods Explanation;Demonstration;Tactile cues;Verbal cues    Comprehension Verbalized understanding;Returned demonstration;Verbal cues required;Tactile cues required            PT Short Term Goals - 06/01/20 1149      PT SHORT TERM GOAL #1   Title Patient demonstrates understanding of HEP    Status On-going      PT SHORT TERM GOAL #2   Title right shoulder PROM flexion 120*, abduction 100* and Ext Rot 45*    Baseline meet see flowsheets    Status  Achieved             PT Long Term Goals - 05/18/20 1607      PT LONG TERM GOAL #1   Title Patient FOTO score 67%    Time 8    Period Weeks    Status New    Target Date 07/09/20      PT LONG TERM GOAL #2   Title Right shoulder PROM within normal limits    Time  8    Period Weeks    Status New    Target Date 07/09/20      PT LONG TERM GOAL #3   Title Right shoulder strength 5/5    Time 8    Period Weeks    Status New    Target Date 07/09/20      PT LONG TERM GOAL #4   Title Patient reports right shoulder pain 0/10    Time 8    Period Weeks    Status New    Target Date 07/09/20                 Plan - 06/30/20 0957    Clinical Impression Statement Patient had mild limitations in end range passive flexion today, although no pain. Therapy added in tanding scaption and flexion in front of the mirror. He tolerated well. Overall he is making great progress. His only pain is at night. We will continue to progress as tolerated.    Personal Factors and Comorbidities Age;Comorbidity 3+    Comorbidities right TTA, right shoulder arthroscopy, right THA. CHF, CAD, PVD, DM2, MI    Examination-Activity Limitations Carry;Lift;Locomotion Level;Reach Overhead    Examination-Participation Restrictions Community Activity    Stability/Clinical Decision Making Evolving/Moderate complexity    Clinical Decision Making Low    Rehab Potential Good    PT Frequency 2x / week    PT Treatment/Interventions ADLs/Self Care Home Management;Cryotherapy;Electrical Stimulation;Ultrasound;Therapeutic activities;Therapeutic exercise;Neuromuscular re-education;Patient/family education;Manual techniques;Passive range of motion;Dry needling;Vasopneumatic Device;Joint Manipulations    PT Home Exercise Plan Access Code: [email or web address]  URL: [email or web address]  Date: 05/28/2020  Prepared by: Carolyne Littles    Exercises  Supine Scapular Retraction - 1 x daily - 7 x weekly - 2 sets - 10 reps - 5  seconds hold  Standing Scapular Depression - 1 x daily - 7 x weekly - 2 sets - 10 reps - 5 seconds hold  Supine Shoulder Flexion AAROM with Hands Clasped - 3 x daily - 7 x weekly - 2 sets - 10 reps - 10 seconds hold  Supine Shoulder External Rotation in 45 Degrees Abduction AAROM with Dowel - 3 x daily - 7 x weekly - 2 sets - 10 reps - 10 seconds hold  Sidelying Shoulder External Rotation - 1 x daily - 7 x weekly - 3 sets - 10 reps  Shoulder extension with resistance - Neutral - 1 x daily - 7 x weekly - 3 sets - 10 reps  Scapular Retraction with Resistance - 1 x daily - 7 x weekly - 3 sets - 10 reps  advised to do banded restractions if tolerated.    Consulted and Agree with Plan of Care Patient;Family member/caregiver    Family Member Consulted wife           Patient will benefit from skilled therapeutic intervention in order to improve the following deficits and impairments:  Decreased strength,Decreased mobility,Decreased range of motion,Increased edema,Impaired flexibility,Postural dysfunction,Pain  Visit Diagnosis: Acute pain of right shoulder  Stiffness of right shoulder, not elsewhere classified  Muscle weakness (generalized)  Abnormal posture  Localized edema     Problem List Patient Active Problem List   Diagnosis Date Noted  . S/P arthroscopy of right shoulder 06/02/2020  . Closed fracture of neck of right femur ([facility])   . Hip fracture ([facility]) 03/27/2020  . Nontraumatic complete tear of right rotator cuff 02/26/2020  . S/P transmetatarsal amputation of foot, left ([facility]) 04/25/2016  . Chronic osteomyelitis of toe, left ([place])   .  Cellulitis of toe of left foot   . Chest pain 09/25/2015  . CAD (coronary artery disease) of artery bypass graft 09/25/2015  . Sinus bradycardia on ECG 09/25/2015  . Chronic diastolic CHF (congestive heart failure) ([place]) 09/25/2015  . Chest pain at rest 09/25/2015  . Aortic valvular disorder 08/04/2014  . Disorder of mitral valve 08/04/2014  .  Diabetic foot infection ([facility]) 06/03/2014  . Peripheral vascular disease ([place]) 06/03/2014  . Abscess of foot 06/03/2014  . CAD (coronary artery disease) 07/29/2013  . Trifascicular block 07/29/2013  . 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 Hypothyroidism 01/22/2007  . Type 2 diabetes mellitus with vascular disease ([facility]) 01/22/2007  . HYPERLIPIDEMIA, MIXED 01/22/2007  . MYOCARDIAL INFARCTION, HX OF 01/22/2007  . DEGENERATIVE JOINT DISEASE 01/22/2007  . ANEMIA, IRON DEFICIENCY, HX OF 01/22/2007    Carney Living PT DPT  06/30/2020, 9:48 PM  [facility] [address] [place], [place], [address] Phone: [phone]   Fax:  [phone]  Name: Keston Seever MRN: [id number] Date of Birth: 1950/05/18

## 2020-07-03 DIAGNOSIS — Z23 Encounter for immunization: Secondary | ICD-10-CM | POA: Diagnosis not present

## 2020-07-05 ENCOUNTER — Ambulatory Visit (HOSPITAL_BASED_OUTPATIENT_CLINIC_OR_DEPARTMENT_OTHER): Payer: Medicare Other | Admitting: Physical Therapy

## 2020-07-05 ENCOUNTER — Encounter (HOSPITAL_BASED_OUTPATIENT_CLINIC_OR_DEPARTMENT_OTHER): Payer: Self-pay | Admitting: Physical Therapy

## 2020-07-05 ENCOUNTER — Other Ambulatory Visit: Payer: Self-pay

## 2020-07-05 DIAGNOSIS — R600 Localized edema: Secondary | ICD-10-CM | POA: Diagnosis not present

## 2020-07-05 DIAGNOSIS — M25511 Pain in right shoulder: Secondary | ICD-10-CM | POA: Diagnosis not present

## 2020-07-05 DIAGNOSIS — R293 Abnormal posture: Secondary | ICD-10-CM | POA: Diagnosis not present

## 2020-07-05 DIAGNOSIS — M25611 Stiffness of right shoulder, not elsewhere classified: Secondary | ICD-10-CM | POA: Diagnosis not present

## 2020-07-05 DIAGNOSIS — M6281 Muscle weakness (generalized): Secondary | ICD-10-CM

## 2020-07-05 NOTE — Therapy (Signed)
[facility] [address] [place], [place], [address] Phone: [phone]   Fax:  [phone]  Physical Therapy Treatment  Patient Details  Name: [name] MRN: [id number] Date of Birth: Oct 22, 1950 Referring Provider (PT): Benita Stabile, [place]   Encounter Date: 07/05/2020   PT End of Session - 07/05/20 1136     Visit Number 12    Number of Visits 17    Date for PT Re-Evaluation 07/09/20    Authorization Type Medicare & BCBS progress note perfromed on the 10th visit.    PT Start Time 0930    PT Stop Time 1009    PT Time Calculation (min) 39 min    Activity Tolerance Patient tolerated treatment well    Behavior During Therapy WFL for tasks assessed/performed             Past Medical History:  Diagnosis Date   Anemia    low iron   Arthritis    Coronary artery disease 2006   a. remote stenting to ramus, prox LAD x2.   Dental crowns present    Diabetes mellitus    First degree AV block    GERD (gastroesophageal reflux disease)    Heart attack ([facility]) 06/2004   Heart murmur    aortic   Hx MRSA infection 02/2006   Hyperlipidemia    Hypertension    hx. of - has not been on med. since losing wt. after gastric bypass   Hypothyroidism    Morbid obesity ([facility])    Mucoid cyst of joint 09/2011   left thumb   Sleep apnea    sleep study 03/29/2011; no CPAP use, lost >130lbs   Trifascicular block     Past Surgical History:  Procedure Laterality Date   AMPUTATION Left 04/19/2016   Procedure: Left Foot 4th Toe Amputation vs. Transmetatarsal;  Surgeon: [name], MD;  Location: [facility];  Service: Orthopedics;  Laterality: Left;   BIOPSY  04/07/2019   Procedure: BIOPSY;  Surgeon: Otis Brace, MD;  [facility];  Service: Gastroenterology;;   CATARACT EXTRACTION  02/2010; 03/2010   COLONOSCOPY WITH PROPOFOL N/A 09/14/2014   Procedure: COLONOSCOPY WITH PROPOFOL;  Surgeon: Garlan Fair, MD;  [facility];   Service: Endoscopy;  Laterality: N/A;   COLONOSCOPY WITH PROPOFOL N/A 04/07/2019   Procedure: COLONOSCOPY WITH PROPOFOL;  Surgeon: Otis Brace, MD;  [facility];  Service: Gastroenterology;  Laterality: N/A;   CORONARY ANGIOPLASTY WITH STENT PLACEMENT  07/12/2004; 08/03/2004   total of 3 stents   I & D EXTREMITY Right 06/04/2014   Procedure: IRRIGATION AND DEBRIDEMENT EXTREMITY;  Surgeon: [name], MD;  Location: [facility];  Service: Orthopedics;  Laterality: Right;   MASS EXCISION  10/04/2011   Procedure: EXCISION MASS;  Surgeon: Wynonia Sours, MD;  Location: [facility];  Service: Orthopedics;  Laterality: Left;  excision cyst debridment ip joint of left thumb   PROSTATECTOMY     right below knee amputation  2018   ROUX-EN-Y GASTRIC BYPASS  10/10/2010   laparoscopic   SHOULDER ARTHROSCOPY WITH ROTATOR CUFF REPAIR AND SUBACROMIAL DECOMPRESSION Right 03/23/2020   Procedure: RIGHT SHOULDER ARTHROSCOPY WITH DEBRIDEMENT AND  ROTATOR CUFF REPAIR;  Surgeon: [name], MD;  Location: [facility];  Service: Orthopedics;  Laterality: Right;   TOE AMPUTATION  02/23/2006   left foot second ray amputation   TOTAL HIP ARTHROPLASTY Right 03/29/2020   Procedure: TOTAL HIP ARTHROPLASTY POSTERIOR APPROACH;  Surgeon: [name],  Marylynn Pearson, MD;  Location: [facility];  Service: Orthopedics;  Laterality: Right;   TRANSMETATARSAL AMPUTATION Left    TRIGGER FINGER RELEASE Right 09/16/2019   Procedure: RELEASE TRIGGER FINGER/A-1 PULLEY;  Surgeon: Daryll Brod, MD;  Location: [facility];  Service: Orthopedics;  Laterality: Right;  IV REGIONAL FOREARM BLOCK    There were no vitals filed for this visit.   Subjective Assessment - 07/05/20 0932     Subjective Patient did a lot over the weekend. He reports it is a little sore today. He had a covid shot in his right arm.    Patient is accompained by: Family member    Pertinent History right TTA, right shoulder arthroscopy, right  THA. CHF, CAD, PVD, DM2, MI    Patient Stated Goals to use right shoulder & hip to return to high level of function with his prosthesis.    Currently in Pain? No/denies    Pain Location Shoulder    Pain Orientation Right    Pain Descriptors / Indicators Aching    Pain Type Chronic pain    Pain Onset More than a month ago    Pain Frequency Intermittent    Aggravating Factors  hurts reaching behind his back.    Multiple Pain Sites No                               [facility] Adult PT Treatment/Exercise - 07/05/20 0001       Knee/Hip Exercises: Sidelying   Other Sidelying Knee/Hip Exercises sidelying er 2x15  1lb      Shoulder Exercises: Supine   Other Supine Exercises wand flexion x20 3lb; right shoulder flexion 1lb x20      Shoulder Exercises: Sidelying   Other Sidelying Exercises Rt ER 2X15 1lb , abd 2X15 (pillow between knees to avoid hip adduction as he has posterior hip precautions)      Shoulder Exercises: Standing   External Rotation Limitations 2x20 green    Internal Rotation Limitations 2x20 blue    Extension Limitations 2x20 blue    Row Limitations 2x20 blue    Other Standing Exercises forward fleixon with towel closed chain 2x10; cabbinet reach 2x10 to first shelf    Other Standing Exercises shoulder circles 2x10; fwd flexion 2x15      Shoulder Exercises: Pulleys   Flexion 2 minutes    ABduction 2 minutes      Shoulder Exercises: ROM/Strengthening   UBE (Upper Arm Bike) 2 min fwd and 2 back      Manual Therapy   Manual therapy comments Grade 2-3 GH mobs inferior, A-P, distraction, right shoulder,  PROM in all planes to tolerance                    PT Education - 07/05/20 0953     Education Details HEP and symptom mangement    Person(s) Educated Patient    Methods Explanation;Demonstration;Tactile cues;Verbal cues    Comprehension Verbalized understanding;Verbal cues required;Returned demonstration;Tactile cues required               PT Short Term Goals - 06/01/20 1149       PT SHORT TERM GOAL #1   Title Patient demonstrates understanding of HEP    Status On-going      PT SHORT TERM GOAL #2   Title right shoulder PROM flexion 120*, abduction 100* and Ext Rot 45*    Baseline meet see flowsheets  Status Achieved               PT Long Term Goals - 05/18/20 1607       PT LONG TERM GOAL #1   Title Patient FOTO score 67%    Time 8    Period Weeks    Status New    Target Date 07/09/20      PT LONG TERM GOAL #2   Title Right shoulder PROM within normal limits    Time 8    Period Weeks    Status New    Target Date 07/09/20      PT LONG TERM GOAL #3   Title Right shoulder strength 5/5    Time 8    Period Weeks    Status New    Target Date 07/09/20      PT LONG TERM GOAL #4   Title Patient reports right shoulder pain 0/10    Time 8    Period Weeks    Status New    Target Date 07/09/20                   Plan - 07/05/20 1137     Clinical Impression Statement Despite baseline soreness, therapy did not have to scale back the patients exercises. He his having some trouble reaching behind him. We will reviewe light IR stretching next visit. He is otherwise progressing as expected. He had full passive motion today    Comorbidities right TTA, right shoulder arthroscopy, right THA. CHF, CAD, PVD, DM2, MI    Examination-Activity Limitations Carry;Lift;Locomotion Level;Reach Overhead    Examination-Participation Restrictions Community Activity    Stability/Clinical Decision Making Evolving/Moderate complexity    Clinical Decision Making Low    Rehab Potential Good    PT Frequency 2x / week    PT Duration 8 weeks    PT Treatment/Interventions ADLs/Self Care Home Management;Cryotherapy;Electrical Stimulation;Ultrasound;Therapeutic activities;Therapeutic exercise;Neuromuscular re-education;Patient/family education;Manual techniques;Passive range of motion;Dry needling;Vasopneumatic  Device;Joint Manipulations    PT Next Visit Plan update HEP, PROM & manual therapy to increase shoulder range, assess & update plan of care to include hip when Dr. [name] refers    PT Home Exercise Plan Access Code: [email or web address]  URL: [email or web address]  Date: 05/28/2020  Prepared by: Carolyne Littles    Exercises  Supine Scapular Retraction - 1 x daily - 7 x weekly - 2 sets - 10 reps - 5 seconds hold  Standing Scapular Depression - 1 x daily - 7 x weekly - 2 sets - 10 reps - 5 seconds hold  Supine Shoulder Flexion AAROM with Hands Clasped - 3 x daily - 7 x weekly - 2 sets - 10 reps - 10 seconds hold  Supine Shoulder External Rotation in 45 Degrees Abduction AAROM with Dowel - 3 x daily - 7 x weekly - 2 sets - 10 reps - 10 seconds hold  Sidelying Shoulder External Rotation - 1 x daily - 7 x weekly - 3 sets - 10 reps  Shoulder extension with resistance - Neutral - 1 x daily - 7 x weekly - 3 sets - 10 reps  Scapular Retraction with Resistance - 1 x daily - 7 x weekly - 3 sets - 10 reps  advised to do banded restractions if tolerated.    Consulted and Agree with Plan of Care Patient;Family member/caregiver    Family Member Consulted wife             Patient will benefit from skilled therapeutic intervention in  order to improve the following deficits and impairments:  Decreased strength, Decreased mobility, Decreased range of motion, Increased edema, Impaired flexibility, Postural dysfunction, Pain  Visit Diagnosis: Acute pain of right shoulder  Stiffness of right shoulder, not elsewhere classified  Muscle weakness (generalized)  Abnormal posture  Localized edema     Problem List Patient Active Problem List   Diagnosis Date Noted   S/P arthroscopy of right shoulder 06/02/2020   Closed fracture of neck of right femur ([facility])    Hip fracture ([place]) 03/27/2020   Nontraumatic complete tear of right rotator cuff 02/26/2020   S/P transmetatarsal amputation of foot, left ([facility]) 04/25/2016    Chronic osteomyelitis of toe, left ([facility])    Cellulitis of toe of left foot    Chest pain 09/25/2015   CAD (coronary artery disease) of artery bypass graft 09/25/2015   Sinus bradycardia on ECG 09/25/2015   Chronic diastolic CHF (congestive heart failure) ([place]) 09/25/2015   Chest pain at rest 09/25/2015   Aortic valvular disorder 08/04/2014   Disorder of mitral valve 08/04/2014   Diabetic foot infection ([facility]) 06/03/2014   Peripheral vascular disease ([place]) 06/03/2014   Abscess of foot 06/03/2014   CAD (coronary artery disease) 07/29/2013   Trifascicular block 07/29/2013   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 07/26/2011   Hypothyroidism 01/22/2007   Type 2 diabetes mellitus with vascular disease ([facility]) 01/22/2007   HYPERLIPIDEMIA, MIXED 01/22/2007   MYOCARDIAL INFARCTION, HX OF 01/22/2007   DEGENERATIVE JOINT DISEASE 01/22/2007   ANEMIA, IRON DEFICIENCY, HX OF 01/22/2007    Carney Living PT DPT  07/05/2020, 11:40 AM  [facility] [address] [place], [place], [address] Phone: [phone]   Fax:  [phone]  Name: [name] MRN: [id number] Date of Birth: 10-07-1950

## 2020-07-07 ENCOUNTER — Ambulatory Visit (HOSPITAL_BASED_OUTPATIENT_CLINIC_OR_DEPARTMENT_OTHER): Payer: Medicare Other | Admitting: Physical Therapy

## 2020-07-07 ENCOUNTER — Other Ambulatory Visit: Payer: Self-pay

## 2020-07-07 DIAGNOSIS — M6281 Muscle weakness (generalized): Secondary | ICD-10-CM

## 2020-07-07 DIAGNOSIS — R600 Localized edema: Secondary | ICD-10-CM | POA: Diagnosis not present

## 2020-07-07 DIAGNOSIS — M25511 Pain in right shoulder: Secondary | ICD-10-CM

## 2020-07-07 DIAGNOSIS — R293 Abnormal posture: Secondary | ICD-10-CM | POA: Diagnosis not present

## 2020-07-07 DIAGNOSIS — M25611 Stiffness of right shoulder, not elsewhere classified: Secondary | ICD-10-CM

## 2020-07-08 ENCOUNTER — Encounter: Payer: Self-pay | Admitting: Physical Medicine and Rehabilitation

## 2020-07-08 ENCOUNTER — Ambulatory Visit: Payer: Self-pay

## 2020-07-08 ENCOUNTER — Ambulatory Visit (INDEPENDENT_AMBULATORY_CARE_PROVIDER_SITE_OTHER): Payer: Medicare Other | Admitting: Physical Medicine and Rehabilitation

## 2020-07-08 ENCOUNTER — Encounter (HOSPITAL_BASED_OUTPATIENT_CLINIC_OR_DEPARTMENT_OTHER): Payer: Self-pay | Admitting: Physical Therapy

## 2020-07-08 VITALS — BP 134/82 | HR 54

## 2020-07-08 DIAGNOSIS — M5416 Radiculopathy, lumbar region: Secondary | ICD-10-CM

## 2020-07-08 MED ORDER — BETAMETHASONE SOD PHOS & ACET 6 (3-3) MG/ML IJ SUSP
12.0000 mg | Freq: Once | INTRAMUSCULAR | Status: AC
  Administered 2020-07-08: 12 mg

## 2020-07-08 NOTE — Patient Instructions (Signed)

## 2020-07-08 NOTE — Progress Notes (Signed)
Pt state lower back pain that travels to his left hip and buttock pain. Pt state sitting for a long time makes the pain worse. Pt state he tries to move around to help ease his pain. Pt has hx of inj on 01/28/20 pt state it helped for a few months.  Numeric Pain Rating Scale and Functional Assessment Average Pain 2   In the last MONTH (on 0-10 scale) has pain interfered with the following?  1. General activity like being  able to carry out your everyday physical activities such as walking, climbing stairs, carrying groceries, or moving a chair?  Rating(7)   +Driver, -BT, -Dye Allergies.

## 2020-07-08 NOTE — Therapy (Addendum)
[facility] [address] [place], [place], [address] Phone: [phone]   Fax:  [phone]  Physical Therapy Treatment Discharge  Patient Details  Name: [name] MRN: [id number] Date of Birth: 10-15-1950 Referring Provider (PT): Benita Stabile, [place]   Encounter Date: 07/07/2020   PT End of Session - 07/08/20 0805     Visit Number 13    Number of Visits 17    Date for PT Re-Evaluation 07/09/20    Authorization Type Medicare & BCBS progress note perfromed on the 10th visit.    PT Start Time 0930    PT Stop Time 1012    PT Time Calculation (min) 42 min    Activity Tolerance Patient tolerated treatment well    Behavior During Therapy WFL for tasks assessed/performed             Past Medical History:  Diagnosis Date   Anemia    low iron   Arthritis    Coronary artery disease 2006   a. remote stenting to ramus, prox LAD x2.   Dental crowns present    Diabetes mellitus    First degree AV block    GERD (gastroesophageal reflux disease)    Heart attack ([place]) 06/2004   Heart murmur    aortic   Hx MRSA infection 02/2006   Hyperlipidemia    Hypertension    hx. of - has not been on med. since losing wt. after gastric bypass   Hypothyroidism    Morbid obesity ([place])    Mucoid cyst of joint 09/2011   left thumb   Sleep apnea    sleep study 03/29/2011; no CPAP use, lost >130lbs   Trifascicular block     Past Surgical History:  Procedure Laterality Date   AMPUTATION Left 04/19/2016   Procedure: Left Foot 4th Toe Amputation vs. Transmetatarsal;  Surgeon: [name], MD;  Location: [facility];  Service: Orthopedics;  Laterality: Left;   BIOPSY  04/07/2019   Procedure: BIOPSY;  Surgeon: Otis Brace, MD;  Location: [facility];  Service: Gastroenterology;;   CATARACT EXTRACTION  02/2010; 03/2010   COLONOSCOPY WITH PROPOFOL N/A 09/14/2014   Procedure: COLONOSCOPY WITH PROPOFOL;  Surgeon: Garlan Fair, MD;  Location: [facility];  Service: Endoscopy;  Laterality: N/A;   COLONOSCOPY WITH PROPOFOL N/A 04/07/2019   Procedure: COLONOSCOPY WITH PROPOFOL;  Surgeon: Otis Brace, MD;  Location: [facility];  Service: Gastroenterology;  Laterality: N/A;   CORONARY ANGIOPLASTY WITH STENT PLACEMENT  07/12/2004; 08/03/2004   total of 3 stents   I & D EXTREMITY Right 06/04/2014   Procedure: IRRIGATION AND DEBRIDEMENT EXTREMITY;  Surgeon: [name], MD;  Location: [facility];  Service: Orthopedics;  Laterality: Right;   MASS EXCISION  10/04/2011   Procedure: EXCISION MASS;  Surgeon: Wynonia Sours, MD;  Location: [facility];  Service: Orthopedics;  Laterality: Left;  excision cyst debridment ip joint of left thumb   PROSTATECTOMY     right below knee amputation  2018   ROUX-EN-Y GASTRIC BYPASS  10/10/2010   laparoscopic   SHOULDER ARTHROSCOPY WITH ROTATOR CUFF REPAIR AND SUBACROMIAL DECOMPRESSION Right 03/23/2020   Procedure: RIGHT SHOULDER ARTHROSCOPY WITH DEBRIDEMENT AND  ROTATOR CUFF REPAIR;  Surgeon: [name], MD;  Location: [facility];  Service: Orthopedics;  Laterality: Right;   TOE AMPUTATION  02/23/2006   left foot second ray amputation   TOTAL HIP ARTHROPLASTY Right 03/29/2020   Procedure: TOTAL HIP ARTHROPLASTY POSTERIOR APPROACH;  Surgeon:  [name], MD;  Location: [facility];  Service: Orthopedics;  Laterality: Right;   TRANSMETATARSAL AMPUTATION Left    TRIGGER FINGER RELEASE Right 09/16/2019   Procedure: RELEASE TRIGGER FINGER/A-1 PULLEY;  Surgeon: Daryll Brod, MD;  Location: [facility];  Service: Orthopedics;  Laterality: Right;  IV REGIONAL FOREARM BLOCK    There were no vitals filed for this visit.   Subjective Assessment - 07/08/20 0803     Subjective Patient reports no real change over the last few visits. He continues to have a dull ache at night. His function ids doing well.    Pertinent History right TTA, right shoulder arthroscopy, right THA. CHF, CAD,  PVD, DM2, MI    Patient Stated Goals to use right shoulder & hip to return to high level of function with his prosthesis.    Currently in Pain? No/denies                               [facility] Adult PT Treatment/Exercise - 07/08/20 0001       Knee/Hip Exercises: Sidelying   Other Sidelying Knee/Hip Exercises sidelying er 2x15  1lb      Shoulder Exercises: Supine   Other Supine Exercises wand flexion x20 3lb; right shoulder flexion 1lb x20      Shoulder Exercises: Sidelying   Other Sidelying Exercises Rt ER 2X15 1lb , abd 2X15 (pillow between knees to avoid hip adduction as he has posterior hip precautions)      Shoulder Exercises: Standing   External Rotation Limitations 2x20 green    Internal Rotation Limitations 2x20 blue    Extension Limitations 2x20 blue    Row Limitations 2x20 blue    Other Standing Exercises forward fleixon with towel closed chain 2x10; cabbinet reach 2x10 to first shelf    Other Standing Exercises shoulder circles 2x10; fwd flexion 2x15      Shoulder Exercises: Pulleys   Flexion 2 minutes    ABduction 2 minutes      Shoulder Exercises: ROM/Strengthening   UBE (Upper Arm Bike) 2 min fwd and 2 back      Manual Therapy   Manual therapy comments Grade 2-3 GH mobs inferior, A-P, distraction, right shoulder,  PROM in all planes to tolerance                    PT Education - 07/08/20 0805     Education Details reviewed HEP    Person(s) Educated Patient    Methods Explanation;Demonstration;Verbal cues;Tactile cues    Comprehension Verbalized understanding;Verbal cues required;Returned demonstration;Tactile cues required              PT Short Term Goals - 06/01/20 1149       PT SHORT TERM GOAL #1   Title Patient demonstrates understanding of HEP    Status On-going      PT SHORT TERM GOAL #2   Title right shoulder PROM flexion 120*, abduction 100* and Ext Rot 45*    Baseline meet see flowsheets    Status Achieved                PT Long Term Goals - 05/18/20 1607       PT LONG TERM GOAL #1   Title Patient FOTO score 67%    Time 8    Period Weeks    Status New    Target Date 07/09/20      PT  LONG TERM GOAL #2   Title Right shoulder PROM within normal limits    Time 8    Period Weeks    Status New    Target Date 07/09/20      PT LONG TERM GOAL #3   Title Right shoulder strength 5/5    Time 8    Period Weeks    Status New    Target Date 07/09/20      PT LONG TERM GOAL #4   Title Patient reports right shoulder pain 0/10    Time 8    Period Weeks    Status New    Target Date 07/09/20                   Plan - 07/08/20 0806     Clinical Impression Statement Patient tolerated exercises well. His exercises were kept consitent. At this point he can reach overhead and behind his head. He is still having some difficulty with IR but he was cuationed to progress this slowly. He was given a light IR stretch to do at home. He was advised to hold off his HEP for the next 4-5 days until we see him to see if that decreases his inflammation at night.    Personal Factors and Comorbidities Age;Comorbidity 3+    Comorbidities right TTA, right shoulder arthroscopy, right THA. CHF, CAD, PVD, DM2, MI    Examination-Activity Limitations Carry;Lift;Locomotion Level;Reach Overhead    Examination-Participation Restrictions Community Activity    Stability/Clinical Decision Making Evolving/Moderate complexity    Clinical Decision Making Low    Rehab Potential Good    PT Frequency 2x / week    PT Duration 8 weeks    PT Treatment/Interventions ADLs/Self Care Home Management;Cryotherapy;Electrical Stimulation;Ultrasound;Therapeutic activities;Therapeutic exercise;Neuromuscular re-education;Patient/family education;Manual techniques;Passive range of motion;Dry needling;Vasopneumatic Device;Joint Manipulations    PT Next Visit Plan update HEP, PROM & manual therapy to increase shoulder range, assess &  update plan of care to include hip when Dr. [name] refers    PT Home Exercise Plan Access Code: [email or web address]  URL: [email or web address]  Date: 05/28/2020  Prepared by: Carolyne Littles    Exercises  Supine Scapular Retraction - 1 x daily - 7 x weekly - 2 sets - 10 reps - 5 seconds hold  Standing Scapular Depression - 1 x daily - 7 x weekly - 2 sets - 10 reps - 5 seconds hold  Supine Shoulder Flexion AAROM with Hands Clasped - 3 x daily - 7 x weekly - 2 sets - 10 reps - 10 seconds hold  Supine Shoulder External Rotation in 45 Degrees Abduction AAROM with Dowel - 3 x daily - 7 x weekly - 2 sets - 10 reps - 10 seconds hold  Sidelying Shoulder External Rotation - 1 x daily - 7 x weekly - 3 sets - 10 reps  Shoulder extension with resistance - Neutral - 1 x daily - 7 x weekly - 3 sets - 10 reps  Scapular Retraction with Resistance - 1 x daily - 7 x weekly - 3 sets - 10 reps  advised to do banded restractions if tolerated.    Consulted and Agree with Plan of Care Patient;Family member/caregiver    Family Member Consulted wife             Patient will benefit from skilled therapeutic intervention in order to improve the following deficits and impairments:  Decreased strength, Decreased mobility, Decreased range of motion, Increased edema, Impaired flexibility, Postural dysfunction, Pain  Visit Diagnosis:  Acute pain of right shoulder  Stiffness of right shoulder, not elsewhere classified  Abnormal posture  Muscle weakness (generalized)  Localized edema     Problem List Patient Active Problem List   Diagnosis Date Noted   S/P arthroscopy of right shoulder 06/02/2020   Closed fracture of neck of right femur (H[facility])    Hip fracture ([place]) 03/27/2020   Nontraumatic complete tear of right rotator cuff 02/26/2020   S/P transmetatarsal amputation of foot, left ([place]) 04/25/2016   Chronic osteomyelitis of toe, left (HCC)    Cellulitis of toe of left foot    Chest pain 09/25/2015   CAD (coronary  artery disease) of artery bypass graft 09/25/2015   Sinus bradycardia on ECG 09/25/2015   Chronic diastolic CHF (congestive heart failure) ([place]) 09/25/2015   Chest pain at rest 09/25/2015   Aortic valvular disorder 08/04/2014   Disorder of mitral valve 08/04/2014   Diabetic foot infection ([place]) 06/03/2014   Peripheral vascular disease ([place]) 06/03/2014   Abscess of foot 06/03/2014   CAD (coronary artery disease) 07/29/2013   Trifascicular block 07/29/2013   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 Patient goals were partially met. Patient is being discharged due to not returning since the last visit.   Carney Living PT DPT  07/08/2020, 8:10 AM  [facility] [address] [place], [place], [address] Phone: [phone]   Fax:  [phone]  Name: [name] MRN: [id number] Date of Birth: [birth year]

## 2020-07-08 NOTE — Progress Notes (Signed)
[name] - 70 y.o. male MRN [id number]  Date of birth: [birth year]  Office Visit Note: Visit Date: 07/08/2020 PCP: [name], MD Referred by: [name], MD  Subjective: Chief Complaint  Patient presents with   Lower Back - Pain   Left Hip - Pain   HPI:  [name] is a 70 y.o. male who comes in today For repeat left L5 transforaminal epidural steroid injection.  He is followed in the office by Dr. Jean Rosenthal and Dr. Eduard Roux.  Since have seen him last he has had traumatic fracture of the right hip and undergoing right total hip arthroplasty.  He reports pain traveling on the left hip and buttock area and down the leg.  He reports 2 out of 10 average pain but worse with standing and ambulating.  He feels like it is worsened since he has had the hip replaced.  He was doing well after the last injection in January which was a left L5 transforaminal injection.  MRI again reviewed with him.  He did have some findings in the upper lumbar region a long time ago and this has regressed to some degree but he still has active edema in the endplates of the T5-5 and L3-4 region.  We will repeat the L5 injection if he does not get much relief would look at L2 or L3.  He will continue with home exercises and medication management.  ROS Otherwise per HPI.  Assessment & Plan: Visit Diagnoses:    ICD-10-CM   1. Lumbar radiculopathy  M54.16 XR C-ARM NO REPORT    Epidural Steroid injection    betamethasone acetate-betamethasone sodium phosphate (CELESTONE) injection 12 mg      Plan: No additional findings.   Meds & Orders:  Meds ordered this encounter  Medications   betamethasone acetate-betamethasone sodium phosphate (CELESTONE) injection 12 mg    Orders Placed This Encounter  Procedures   XR C-ARM NO REPORT   Epidural Steroid injection    Follow-up: Return if symptoms worsen or fail to improve.   Procedures: No procedures performed  Lumbosacral Transforaminal Epidural  Steroid Injection - Sub-Pedicular Approach with Fluoroscopic Guidance  Patient: [name]      Date of Birth: Jul 04, 1950 MRN: [id number] PCP: [name], MD      Visit Date: 07/08/2020   Universal Protocol:    Date/Time: 07/08/2020  Consent Given By: the patient  Position: PRONE  Additional Comments: Vital signs were monitored before and after the procedure. Patient was prepped and draped in the usual sterile fashion. The correct patient, procedure, and site was verified.   Injection Procedure Details:   Procedure diagnoses: Lumbar radiculopathy [M54.16]    Meds Administered:  Meds ordered this encounter  Medications   betamethasone acetate-betamethasone sodium phosphate (CELESTONE) injection 12 mg    Laterality: Left  Location/Site:  L5-S1  Needle:5.0 in., 22 ga.  Short bevel or Quincke spinal needle  Needle Placement: Transforaminal  Findings:    -Comments: Excellent flow of contrast along the nerve, nerve root and into the epidural space.  Procedure Details: After squaring off the end-plates to get a true AP view, the C-arm was positioned so that an oblique view of the foramen as noted above was visualized. The target area is just inferior to the "nose of the scotty dog" or sub pedicular. The soft tissues overlying this structure were infiltrated with 2-3 ml. of 1% Lidocaine without Epinephrine.  The spinal needle was inserted toward the target using a "trajectory" view  along the fluoroscope beam.  Under AP and lateral visualization, the needle was advanced so it did not puncture dura and was located close the 6 O'Clock position of the pedical in AP tracterory. Biplanar projections were used to confirm position. Aspiration was confirmed to be negative for CSF and/or blood. A 1-2 ml. volume of Isovue-250 was injected and flow of contrast was noted at each level. Radiographs were obtained for documentation purposes.   After attaining the desired flow of contrast  documented above, a 0.5 to 1.0 ml test dose of 0.25% Marcaine was injected into each respective transforaminal space.  The patient was observed for 90 seconds post injection.  After no sensory deficits were reported, and normal lower extremity motor function was noted,   the above injectate was administered so that equal amounts of the injectate were placed at each foramen (level) into the transforaminal epidural space.   Additional Comments:  No complications occurred Dressing: 2 x 2 sterile gauze and Band-Aid    Post-procedure details: Patient was observed during the procedure. Post-procedure instructions were reviewed.  Patient left the clinic in stable condition.     Clinical History: MRI LUMBAR SPINE WITHOUT CONTRAST   TECHNIQUE: Multiplanar, multisequence MR imaging of the lumbar spine was performed. No intravenous contrast was administered.   COMPARISON:  07/27/2012   FINDINGS: Segmentation:  Standard.   Alignment: Mild lumbar dextroscoliosis. Unchanged trace retrolisthesis of L2 on L3. New trace retrolisthesis of L3 on L4.   Vertebrae: No acute fracture. Progressive chronic degenerative endplate changes on the left at L2-3 with decreased degenerative endplate edema. New prominent edema type signal in the posteroinferior aspect of the L3 vertebral body slightly greater to the left with a possible underlying Schmorl's node or other lesion involving the inferior endplate. No suspicious marrow lesion elsewhere.   Conus medullaris and cauda equina: Conus extends to the L1 level. Conus and cauda equina appear normal.   Paraspinal and other soft tissues: Unremarkable.   Disc levels:   Disc desiccation throughout the lumbar spine. Progressive, severe disc space narrowing at L2-3 with chronic moderate narrowing at L1-2.   T12-L1: Minimal disc bulging and a shallow left paracentral disc osteophyte complex without stenosis.   L1-2: Small central disc protrusion is  smaller than on the prior study. Mild disc bulging and mild facet and ligamentum flavum hypertrophy. No significant stenosis.   L2-3: A left subarticular disc protrusion on the prior study has regressed with improved left lateral recess patency. Circumferential disc bulging eccentric to the left, endplate spurring, and mild facet and ligamentum flavum hypertrophy result in mild left greater than right lateral recess stenosis and mild left greater than right neural foraminal stenosis without significant spinal stenosis.   L3-4: Circumferential disc bulging, a right foraminal to extraforaminal disc protrusion with annular fissure, and moderate facet hypertrophy result in mild spinal stenosis, moderate right greater than left lateral recess stenosis, and moderate right and mild left neural foraminal stenosis. Lateral recess stenosis has mildly progressed.   L4-5: Circumferential disc bulging and moderate facet and ligamentum flavum hypertrophy result in moderate right and mild left lateral recess stenosis and mild-to-moderate bilateral neural foraminal stenosis, mildly progressed from prior. No significant spinal stenosis. Unchanged left foraminal annular fissure.   L5-S1: Disc bulging, endplate spurring, and mild facet hypertrophy result in moderate bilateral neural foraminal stenosis without spinal stenosis, unchanged. A left paracentral/subarticular annular fissure is unchanged.   IMPRESSION: 1. New prominent edema type signal in the posteroinferior L3 vertebral body with  evidence of a small underlying lesion involving the inferior endplate. This may represent an acute Schmorl's node, however a metastasis or myeloma deposit is also possible. No other suspicious lesions in the lumbar spine. If the patient has a history of malignancy, consider nuclear medicine whole body bone scan to further evaluate for metastatic disease. 2. Mildly progressive lateral recess stenosis at L3-4 and  moderate right and mild left neural foraminal stenosis. 3. Chronic severe disc degeneration at L2-3 with interval regression of a left subarticular disc protrusion with improved lateral recess patency. 4. Moderate right lateral recess and mild-to-moderate bilateral foraminal stenosis at L4-5, mildly progressed. 5. Unchanged moderate bilateral foraminal stenosis at L5-S1.     Electronically Signed   By: Logan Bores M.D.   On: 02/12/2019 04:32     Objective:  VS:  HT:    WT:   BMI:     BP:134/82  HR:(!) 54bpm  TEMP: ( )  RESP:  Physical Exam Vitals and nursing note reviewed.  Constitutional:      General: He is not in acute distress.    Appearance: Normal appearance. He is not ill-appearing.  HENT:     Head: Normocephalic and atraumatic.     Right Ear: External ear normal.     Left Ear: External ear normal.     Nose: No congestion.  Eyes:     Extraocular Movements: Extraocular movements intact.  Cardiovascular:     Rate and Rhythm: Normal rate.     Pulses: Normal pulses.  Pulmonary:     Effort: Pulmonary effort is normal. No respiratory distress.  Abdominal:     General: There is no distension.     Palpations: Abdomen is soft.  Musculoskeletal:        General: No tenderness or signs of injury.     Cervical back: Neck supple.     Right lower leg: No edema.     Left lower leg: No edema.     Comments: Patient has good distal strength without clonus.  Skin:    Findings: No erythema or rash.  Neurological:     General: No focal deficit present.     Mental Status: He is alert and oriented to person, place, and time.     Sensory: No sensory deficit.     Motor: No weakness or abnormal muscle tone.     Coordination: Coordination normal.  Psychiatric:        Mood and Affect: Mood normal.        Behavior: Behavior normal.     Imaging: No results found.

## 2020-07-12 ENCOUNTER — Ambulatory Visit (HOSPITAL_BASED_OUTPATIENT_CLINIC_OR_DEPARTMENT_OTHER): Payer: Medicare Other | Admitting: Physical Therapy

## 2020-07-12 ENCOUNTER — Encounter (HOSPITAL_BASED_OUTPATIENT_CLINIC_OR_DEPARTMENT_OTHER): Payer: Self-pay | Admitting: Physical Therapy

## 2020-07-12 ENCOUNTER — Other Ambulatory Visit: Payer: Self-pay

## 2020-07-12 DIAGNOSIS — R600 Localized edema: Secondary | ICD-10-CM

## 2020-07-12 DIAGNOSIS — M25611 Stiffness of right shoulder, not elsewhere classified: Secondary | ICD-10-CM | POA: Diagnosis not present

## 2020-07-12 DIAGNOSIS — M6281 Muscle weakness (generalized): Secondary | ICD-10-CM

## 2020-07-12 DIAGNOSIS — R293 Abnormal posture: Secondary | ICD-10-CM | POA: Diagnosis not present

## 2020-07-12 DIAGNOSIS — M25511 Pain in right shoulder: Secondary | ICD-10-CM

## 2020-07-13 ENCOUNTER — Encounter (HOSPITAL_BASED_OUTPATIENT_CLINIC_OR_DEPARTMENT_OTHER): Payer: Self-pay | Admitting: Physical Therapy

## 2020-07-13 NOTE — Therapy (Signed)
[facility] [address] [place], [place], [address] Phone: [phone]   Fax:  [phone]  Physical Therapy Treatment  Patient Details  Name: [name] MRN: [id number] Date of Birth: 08/23/1950 Referring Provider (PT): Benita Stabile, [place]   Encounter Date: 07/12/2020   PT End of Session - 07/12/20 1017     Visit Number 14    Number of Visits 17    Date for PT Re-Evaluation 07/09/20    Authorization Type Medicare & BCBS progress note perfromed on the 10th visit.    PT Start Time 0930    PT Stop Time 1010    PT Time Calculation (min) 40 min    Activity Tolerance Patient tolerated treatment well    Behavior During Therapy WFL for tasks assessed/performed             Past Medical History:  Diagnosis Date   Anemia    low iron   Arthritis    Coronary artery disease 2006   a. remote stenting to ramus, prox LAD x2.   Dental crowns present    Diabetes mellitus    First degree AV block    GERD (gastroesophageal reflux disease)    Heart attack ([place]) 06/2004   Heart murmur    aortic   Hx MRSA infection 02/2006   Hyperlipidemia    Hypertension    hx. of - has not been on med. since losing wt. after gastric bypass   Hypothyroidism    Morbid obesity ([place])    Mucoid cyst of joint 09/2011   left thumb   Sleep apnea    sleep study 03/29/2011; no CPAP use, lost >130lbs   Trifascicular block     Past Surgical History:  Procedure Laterality Date   AMPUTATION Left 04/19/2016   Procedure: Left Foot 4th Toe Amputation vs. Transmetatarsal;  Surgeon: [name], MD;  Location: [facility];  Service: Orthopedics;  Laterality: Left;   BIOPSY  04/07/2019   Procedure: BIOPSY;  Surgeon: Otis Brace, MD;  [facility];  Service: Gastroenterology;;   CATARACT EXTRACTION  02/2010; 03/2010   COLONOSCOPY WITH PROPOFOL N/A 09/14/2014   Procedure: COLONOSCOPY WITH PROPOFOL;  Surgeon: Garlan Fair, MD;  [facility];   Service: Endoscopy;  Laterality: N/A;   COLONOSCOPY WITH PROPOFOL N/A 04/07/2019   Procedure: COLONOSCOPY WITH PROPOFOL;  Surgeon: Otis Brace, MD;  [facility];  Service: Gastroenterology;  Laterality: N/A;   CORONARY ANGIOPLASTY WITH STENT PLACEMENT  07/12/2004; 08/03/2004   total of 3 stents   I & D EXTREMITY Right 06/04/2014   Procedure: IRRIGATION AND DEBRIDEMENT EXTREMITY;  Surgeon: [name], MD;  Location: [facility];  Service: Orthopedics;  Laterality: Right;   MASS EXCISION  10/04/2011   Procedure: EXCISION MASS;  Surgeon: Wynonia Sours, MD;  Location: [facility];  Service: Orthopedics;  Laterality: Left;  excision cyst debridment ip joint of left thumb   PROSTATECTOMY     right below knee amputation  2018   ROUX-EN-Y GASTRIC BYPASS  10/10/2010   laparoscopic   SHOULDER ARTHROSCOPY WITH ROTATOR CUFF REPAIR AND SUBACROMIAL DECOMPRESSION Right 03/23/2020   Procedure: RIGHT SHOULDER ARTHROSCOPY WITH DEBRIDEMENT AND  ROTATOR CUFF REPAIR;  Surgeon: [name], MD;  Location: [facility];  Service: Orthopedics;  Laterality: Right;   TOE AMPUTATION  02/23/2006   left foot second ray amputation   TOTAL HIP ARTHROPLASTY Right 03/29/2020   Procedure: TOTAL HIP ARTHROPLASTY POSTERIOR APPROACH;  Surgeon: [name],  Marylynn Pearson, MD;  Location: [facility];  Service: Orthopedics;  Laterality: Right;   TRANSMETATARSAL AMPUTATION Left    TRIGGER FINGER RELEASE Right 09/16/2019   Procedure: RELEASE TRIGGER FINGER/A-1 PULLEY;  Surgeon: Daryll Brod, MD;  Location: [facility];  Service: Orthopedics;  Laterality: Right;  IV REGIONAL FOREARM BLOCK    There were no vitals filed for this visit.   Subjective Assessment - 07/12/20 0939     Subjective Patient took some time off from his exercises. He reports the pain at night has improved.    Patient is accompained by: Family member    Pertinent History right TTA, right shoulder arthroscopy, right THA. CHF, CAD, PVD,  DM2, MI    Patient Stated Goals to use right shoulder & hip to return to high level of function with his prosthesis.    Currently in Pain? No/denies                               [facility] Adult PT Treatment/Exercise - 07/13/20 0001       Knee/Hip Exercises: Sidelying   Other Sidelying Knee/Hip Exercises sidelying er 2x15  1lb      Shoulder Exercises: Supine   Other Supine Exercises wand flexion x20 3lb; right shoulder flexion 1lb x20      Shoulder Exercises: Sidelying   Other Sidelying Exercises Rt ER 2X15 1lb , abd 2X15 (pillow between knees to avoid hip adduction as he has posterior hip precautions)      Shoulder Exercises: Standing   External Rotation Limitations 2x20 green    Internal Rotation Limitations 2x20 blue    Extension Limitations 2x20 blue    Row Limitations 2x20 blue    Other Standing Exercises forward fleixon with towel closed chain 2x10; cabbinet reach 2x10 to first shelf    Other Standing Exercises shoulder circles 2x10; fwd flexion 2x15      Shoulder Exercises: Pulleys   Flexion 2 minutes    ABduction 2 minutes      Shoulder Exercises: ROM/Strengthening   UBE (Upper Arm Bike) 2 min fwd and 2 back      Manual Therapy   Manual therapy comments Grade 2-3 GH mobs inferior, A-P, distraction, right shoulder,  PROM in all planes to tolerance                    PT Education - 07/12/20 1003     Education Details reviewed exercises and exercise frequency    Person(s) Educated Patient    Methods Explanation;Demonstration;Tactile cues;Verbal cues    Comprehension Verbalized understanding;Returned demonstration;Verbal cues required;Tactile cues required              PT Short Term Goals - 06/01/20 1149       PT SHORT TERM GOAL #1   Title Patient demonstrates understanding of HEP    Status On-going      PT SHORT TERM GOAL #2   Title right shoulder PROM flexion 120*, abduction 100* and Ext Rot 45*    Baseline meet see  flowsheets    Status Achieved               PT Long Term Goals - 05/18/20 1607       PT LONG TERM GOAL #1   Title Patient FOTO score 67%    Time 8    Period Weeks    Status New    Target Date 07/09/20  PT LONG TERM GOAL #2   Title Right shoulder PROM within normal limits    Time 8    Period Weeks    Status New    Target Date 07/09/20      PT LONG TERM GOAL #3   Title Right shoulder strength 5/5    Time 8    Period Weeks    Status New    Target Date 07/09/20      PT LONG TERM GOAL #4   Title Patient reports right shoulder pain 0/10    Time 8    Period Weeks    Status New    Target Date 07/09/20                   Plan - 07/13/20 0810     Clinical Impression Statement Patients pain has improved at night since he backed off his exercises. Functionally he can reach over head, reach behnd his head, and reach behind his back. He ewas advised to do his exercises every other day at this point. We also discussed discharge. He has a full HEP. He will consider his options next visit. He tolerated exercises well today.    Personal Factors and Comorbidities Age;Comorbidity 3+    Comorbidities right TTA, right shoulder arthroscopy, right THA. CHF, CAD, PVD, DM2, MI    Examination-Activity Limitations Carry;Lift;Locomotion Level;Reach Overhead    Stability/Clinical Decision Making Evolving/Moderate complexity    Clinical Decision Making Low    Rehab Potential Good    PT Frequency 2x / week    PT Duration 8 weeks    PT Treatment/Interventions ADLs/Self Care Home Management;Cryotherapy;Electrical Stimulation;Ultrasound;Therapeutic activities;Therapeutic exercise;Neuromuscular re-education;Patient/family education;Manual techniques;Passive range of motion;Dry needling;Vasopneumatic Device;Joint Manipulations    PT Home Exercise Plan Access Code: [email or web address]  URL: [email or web address]  Date: 05/28/2020  Prepared by: Carolyne Littles    Exercises  Supine  Scapular Retraction - 1 x daily - 7 x weekly - 2 sets - 10 reps - 5 seconds hold  Standing Scapular Depression - 1 x daily - 7 x weekly - 2 sets - 10 reps - 5 seconds hold  Supine Shoulder Flexion AAROM with Hands Clasped - 3 x daily - 7 x weekly - 2 sets - 10 reps - 10 seconds hold  Supine Shoulder External Rotation in 45 Degrees Abduction AAROM with Dowel - 3 x daily - 7 x weekly - 2 sets - 10 reps - 10 seconds hold  Sidelying Shoulder External Rotation - 1 x daily - 7 x weekly - 3 sets - 10 reps  Shoulder extension with resistance - Neutral - 1 x daily - 7 x weekly - 3 sets - 10 reps  Scapular Retraction with Resistance - 1 x daily - 7 x weekly - 3 sets - 10 reps  advised to do banded restractions if tolerated.    Consulted and Agree with Plan of Care Patient;Family member/caregiver    Family Member Consulted wife             Patient will benefit from skilled therapeutic intervention in order to improve the following deficits and impairments:  Decreased strength, Decreased mobility, Decreased range of motion, Increased edema, Impaired flexibility, Postural dysfunction, Pain  Visit Diagnosis: Acute pain of right shoulder  Stiffness of right shoulder, not elsewhere classified  Abnormal posture  Muscle weakness (generalized)  Localized edema     Problem List Patient Active Problem List   Diagnosis Date Noted   S/P arthroscopy of right shoulder 06/02/2020  Closed fracture of neck of right femur ([place])    Hip fracture ([place]) 03/27/2020   Nontraumatic complete tear of right rotator cuff 02/26/2020   S/P transmetatarsal amputation of foot, left ([place]) 04/25/2016   Chronic osteomyelitis of toe, left ([facility])    Cellulitis of toe of left foot    Chest pain 09/25/2015   CAD (coronary artery disease) of artery bypass graft 09/25/2015   Sinus bradycardia on ECG 09/25/2015   Chronic diastolic CHF (congestive heart failure) ([place]) 09/25/2015   Chest pain at rest 09/25/2015   Aortic valvular  disorder 08/04/2014   Disorder of mitral valve 08/04/2014   Diabetic foot infection ([place]) 06/03/2014   Peripheral vascular disease ([place]) 06/03/2014   Abscess of foot 06/03/2014   CAD (coronary artery disease) 07/29/2013   Trifascicular block 07/29/2013   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 [place], [place], [address] Phone: [phone]   Fax:  [phone]  Name: [name] MRN: [id number] Date of Birth: [birth year]

## 2020-07-14 ENCOUNTER — Ambulatory Visit (HOSPITAL_BASED_OUTPATIENT_CLINIC_OR_DEPARTMENT_OTHER): Payer: Medicare Other | Admitting: Physical Therapy

## 2020-07-14 ENCOUNTER — Other Ambulatory Visit: Payer: Self-pay

## 2020-07-14 DIAGNOSIS — M6281 Muscle weakness (generalized): Secondary | ICD-10-CM | POA: Diagnosis not present

## 2020-07-14 DIAGNOSIS — R293 Abnormal posture: Secondary | ICD-10-CM

## 2020-07-14 DIAGNOSIS — M25511 Pain in right shoulder: Secondary | ICD-10-CM

## 2020-07-14 DIAGNOSIS — M25611 Stiffness of right shoulder, not elsewhere classified: Secondary | ICD-10-CM

## 2020-07-14 DIAGNOSIS — R600 Localized edema: Secondary | ICD-10-CM | POA: Diagnosis not present

## 2020-07-15 NOTE — Therapy (Signed)
[facility] [address] [place], [place], [address] Phone: [phone]   Fax:  [phone]  Physical Therapy Treatment  Patient Details  Name: [name] MRN: [id number] Date of Birth: Dec 18, 1950 Referring Provider (PT): Benita Stabile, [place]   Encounter Date: 07/14/2020   PT End of Session - 07/15/20 0807     Visit Number 15    Number of Visits 17    Date for PT Re-Evaluation 08/12/20    Authorization Type Medicare & BCBS progress note perfromed on the 10th visit.    PT Start Time 0930    PT Stop Time 1012    PT Time Calculation (min) 42 min    Activity Tolerance Patient tolerated treatment well    Behavior During Therapy WFL for tasks assessed/performed             Past Medical History:  Diagnosis Date   Anemia    low iron   Arthritis    Coronary artery disease 2006   a. remote stenting to ramus, prox LAD x2.   Dental crowns present    Diabetes mellitus    First degree AV block    GERD (gastroesophageal reflux disease)    Heart attack ([place]) 06/2004   Heart murmur    aortic   Hx MRSA infection 02/2006   Hyperlipidemia    Hypertension    hx. of - has not been on med. since losing wt. after gastric bypass   Hypothyroidism    Morbid obesity ([place])    Mucoid cyst of joint 09/2011   left thumb   Sleep apnea    sleep study 03/29/2011; no CPAP use, lost >130lbs   Trifascicular block     Past Surgical History:  Procedure Laterality Date   AMPUTATION Left 04/19/2016   Procedure: Left Foot 4th Toe Amputation vs. Transmetatarsal;  Surgeon: [name], MD;  Location: [facility];  Service: Orthopedics;  Laterality: Left;   BIOPSY  04/07/2019   Procedure: BIOPSY;  Surgeon: Otis Brace, MD;  [facility];  Service: Gastroenterology;;   CATARACT EXTRACTION  02/2010; 03/2010   COLONOSCOPY WITH PROPOFOL N/A 09/14/2014   Procedure: COLONOSCOPY WITH PROPOFOL;  Surgeon: Garlan Fair, MD;  [facility];   Service: Endoscopy;  Laterality: N/A;   COLONOSCOPY WITH PROPOFOL N/A 04/07/2019   Procedure: COLONOSCOPY WITH PROPOFOL;  Surgeon: Otis Brace, MD;  [facility];  Service: Gastroenterology;  Laterality: N/A;   CORONARY ANGIOPLASTY WITH STENT PLACEMENT  07/12/2004; 08/03/2004   total of 3 stents   I & D EXTREMITY Right 06/04/2014   Procedure: IRRIGATION AND DEBRIDEMENT EXTREMITY;  Surgeon: [name], MD;  Location: [facility];  Service: Orthopedics;  Laterality: Right;   MASS EXCISION  10/04/2011   Procedure: EXCISION MASS;  Surgeon: Wynonia Sours, MD;  Location: [facility];  Service: Orthopedics;  Laterality: Left;  excision cyst debridment ip joint of left thumb   PROSTATECTOMY     right below knee amputation  2018   ROUX-EN-Y GASTRIC BYPASS  10/10/2010   laparoscopic   SHOULDER ARTHROSCOPY WITH ROTATOR CUFF REPAIR AND SUBACROMIAL DECOMPRESSION Right 03/23/2020   Procedure: RIGHT SHOULDER ARTHROSCOPY WITH DEBRIDEMENT AND  ROTATOR CUFF REPAIR;  Surgeon: [name], MD;  Location: [facility];  Service: Orthopedics;  Laterality: Right;   TOE AMPUTATION  02/23/2006   left foot second ray amputation   TOTAL HIP ARTHROPLASTY Right 03/29/2020   Procedure: TOTAL HIP ARTHROPLASTY POSTERIOR APPROACH;  Surgeon: [name],  Marylynn Pearson, MD;  Location: [facility];  Service: Orthopedics;  Laterality: Right;   TRANSMETATARSAL AMPUTATION Left    TRIGGER FINGER RELEASE Right 09/16/2019   Procedure: RELEASE TRIGGER FINGER/A-1 PULLEY;  Surgeon: Daryll Brod, MD;  Location: [facility];  Service: Orthopedics;  Laterality: Right;  IV REGIONAL FOREARM BLOCK    There were no vitals filed for this visit.   Subjective Assessment - 07/15/20 0806     Subjective Patient tore out a fire pit yesterday. His shoulderwas sore at night after doing that.    Pertinent History right TTA, right shoulder arthroscopy, right THA. CHF, CAD, PVD, DM2, MI    Patient Stated Goals to use right  shoulder & hip to return to high level of function with his prosthesis.    Currently in Pain? No/denies                [facility] PT Assessment - 07/15/20 0001       AROM   Right Shoulder Flexion --   160 degrees   Right Shoulder Internal Rotation --   can reach belt with mild pain   Right Shoulder External Rotation --   can reach behind his head without pain     PROM   Right Shoulder Flexion 180 Degrees    Right Shoulder Internal Rotation 75 Degrees    Right Shoulder External Rotation 80 Degrees                           [facility] Adult PT Treatment/Exercise - 07/15/20 0001       Knee/Hip Exercises: Sidelying   Other Sidelying Knee/Hip Exercises sidelying er 2x15  1lb      Shoulder Exercises: Supine   Other Supine Exercises wand flexion x20 3lb; right shoulder flexion 1lb x20      Shoulder Exercises: Sidelying   Other Sidelying Exercises Rt ER 2X15 1lb , abd 2X15 (pillow between knees to avoid hip adduction as he has posterior hip precautions)      Shoulder Exercises: Standing   External Rotation Limitations 2x20 green    Internal Rotation Limitations 2x20 blue    Extension Limitations 2x20 blue    Row Limitations 2x20 blue    Other Standing Exercises forward fleixon with towel closed chain 2x10; cabbinet reach 2x10 to first shelf    Other Standing Exercises shoulder circles 2x10; fwd flexion 2x15      Shoulder Exercises: Pulleys   Flexion 2 minutes    ABduction 2 minutes      Shoulder Exercises: ROM/Strengthening   UBE (Upper Arm Bike) 2 min fwd and 2 back      Manual Therapy   Manual therapy comments Grade 2-3 GH mobs inferior, A-P, distraction, right shoulder,  PROM in all planes to tolerance                    PT Education - 07/15/20 0807     Education Details HEP and symptom mangement    Person(s) Educated Patient    Methods Explanation;Demonstration;Verbal cues;Tactile cues    Comprehension Verbalized understanding;Returned  demonstration;Verbal cues required;Tactile cues required              PT Short Term Goals - 06/01/20 1149       PT SHORT TERM GOAL #1   Title Patient demonstrates understanding of HEP    Status On-going      PT SHORT TERM GOAL #2   Title right shoulder  PROM flexion 120*, abduction 100* and Ext Rot 45*    Baseline meet see flowsheets    Status Achieved               PT Long Term Goals - 05/18/20 1607       PT LONG TERM GOAL #1   Title Patient FOTO score 67%    Time 8    Period Weeks    Status New    Target Date 07/09/20      PT LONG TERM GOAL #2   Title Right shoulder PROM within normal limits    Time 8    Period Weeks    Status New    Target Date 07/09/20      PT LONG TERM GOAL #3   Title Right shoulder strength 5/5    Time 8    Period Weeks    Status New    Target Date 07/09/20      PT LONG TERM GOAL #4   Title Patient reports right shoulder pain 0/10    Time 8    Period Weeks    Status New    Target Date 07/09/20                   Plan - 07/15/20 0808     Clinical Impression Statement Patiet is progressing towards goals. He has full passive ROM. He has minor limitations in active motion with pain at end ranges. He has a full exeercise program. he would like to finish his last 2 visits to continue to progress his HEP. he had no pain with his exercises. He continues to have pain with end range internal rotation. He was advised not to force end range internal rotation    Personal Factors and Comorbidities Age;Comorbidity 3+    Comorbidities right TTA, right shoulder arthroscopy, right THA. CHF, CAD, PVD, DM2, MI    Examination-Activity Limitations Carry;Lift;Locomotion Level;Reach Overhead    Stability/Clinical Decision Making Evolving/Moderate complexity    Clinical Decision Making Low    Rehab Potential Good    PT Frequency 2x / week    PT Duration 8 weeks    PT Treatment/Interventions ADLs/Self Care Home  Management;Cryotherapy;Electrical Stimulation;Ultrasound;Therapeutic activities;Therapeutic exercise;Neuromuscular re-education;Patient/family education;Manual techniques;Passive range of motion;Dry needling;Vasopneumatic Device;Joint Manipulations    PT Next Visit Plan update HEP, PROM & manual therapy to increase shoulder range, assess & update plan of care to include hip when Dr. [name] refers    PT Home Exercise Plan Access Code: [email or web address]  URL: [email or web address]  Date: 05/28/2020  Prepared by: Carolyne Littles    Exercises  Supine Scapular Retraction - 1 x daily - 7 x weekly - 2 sets - 10 reps - 5 seconds hold  Standing Scapular Depression - 1 x daily - 7 x weekly - 2 sets - 10 reps - 5 seconds hold  Supine Shoulder Flexion AAROM with Hands Clasped - 3 x daily - 7 x weekly - 2 sets - 10 reps - 10 seconds hold  Supine Shoulder External Rotation in 45 Degrees Abduction AAROM with Dowel - 3 x daily - 7 x weekly - 2 sets - 10 reps - 10 seconds hold  Sidelying Shoulder External Rotation - 1 x daily - 7 x weekly - 3 sets - 10 reps  Shoulder extension with resistance - Neutral - 1 x daily - 7 x weekly - 3 sets - 10 reps  Scapular Retraction with Resistance - 1 x daily - 7 x weekly - 3 sets -  10 reps  advised to do banded restractions if tolerated.    Consulted and Agree with Plan of Care Patient;Family member/caregiver             Patient will benefit from skilled therapeutic intervention in order to improve the following deficits and impairments:  Decreased strength, Decreased mobility, Decreased range of motion, Increased edema, Impaired flexibility, Postural dysfunction, Pain  Visit Diagnosis: Acute pain of right shoulder - Plan: PT plan of care cert/re-cert  Stiffness of right shoulder, not elsewhere classified - Plan: PT plan of care cert/re-cert  Abnormal posture - Plan: PT plan of care cert/re-cert     Problem List Patient Active Problem List   Diagnosis Date Noted   S/P  arthroscopy of right shoulder 06/02/2020   Closed fracture of neck of right femur ([facility])    Hip fracture ([place]) 03/27/2020   Nontraumatic complete tear of right rotator cuff 02/26/2020   S/P transmetatarsal amputation of foot, left ([place]) 04/25/2016   Chronic osteomyelitis of toe, left ([facility])    Cellulitis of toe of left foot    Chest pain 09/25/2015   CAD (coronary artery disease) of artery bypass graft 09/25/2015   Sinus bradycardia on ECG 09/25/2015   Chronic diastolic CHF (congestive heart failure) ([place]) 09/25/2015   Chest pain at rest 09/25/2015   Aortic valvular disorder 08/04/2014   Disorder of mitral valve 08/04/2014   Diabetic foot infection ([place]) 06/03/2014   Peripheral vascular disease ([place]) 06/03/2014   Abscess of foot 06/03/2014   CAD (coronary artery disease) 07/29/2013   Trifascicular block 07/29/2013   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 [place], [place], [address] Phone: [phone]   Fax:  [phone]  Name: [name] MRN: [id number] Date of Birth: [birth year]

## 2020-07-18 NOTE — Procedures (Signed)
Lumbosacral Transforaminal Epidural Steroid Injection - Sub-Pedicular Approach with Fluoroscopic Guidance  Patient: [name]      Date of Birth: April 14, 1950 MRN: [id number] PCP: [name], MD      Visit Date: 07/08/2020   Universal Protocol:    Date/Time: 07/08/2020  Consent Given By: the patient  Position: PRONE  Additional Comments: Vital signs were monitored before and after the procedure. Patient was prepped and draped in the usual sterile fashion. The correct patient, procedure, and site was verified.   Injection Procedure Details:   Procedure diagnoses: Lumbar radiculopathy [M54.16]    Meds Administered:  Meds ordered this encounter  Medications   betamethasone acetate-betamethasone sodium phosphate (CELESTONE) injection 12 mg    Laterality: Left  Location/Site:  L5-S1  Needle:5.0 in., 22 ga.  Short bevel or Quincke spinal needle  Needle Placement: Transforaminal  Findings:    -Comments: Excellent flow of contrast along the nerve, nerve root and into the epidural space.  Procedure Details: After squaring off the end-plates to get a true AP view, the C-arm was positioned so that an oblique view of the foramen as noted above was visualized. The target area is just inferior to the "nose of the scotty dog" or sub pedicular. The soft tissues overlying this structure were infiltrated with 2-3 ml. of 1% Lidocaine without Epinephrine.  The spinal needle was inserted toward the target using a "trajectory" view along the fluoroscope beam.  Under AP and lateral visualization, the needle was advanced so it did not puncture dura and was located close the 6 O'Clock position of the pedical in AP tracterory. Biplanar projections were used to confirm position. Aspiration was confirmed to be negative for CSF and/or blood. A 1-2 ml. volume of Isovue-250 was injected and flow of contrast was noted at each level. Radiographs were obtained for documentation purposes.   After  attaining the desired flow of contrast documented above, a 0.5 to 1.0 ml test dose of 0.25% Marcaine was injected into each respective transforaminal space.  The patient was observed for 90 seconds post injection.  After no sensory deficits were reported, and normal lower extremity motor function was noted,   the above injectate was administered so that equal amounts of the injectate were placed at each foramen (level) into the transforaminal epidural space.   Additional Comments:  No complications occurred Dressing: 2 x 2 sterile gauze and Band-Aid    Post-procedure details: Patient was observed during the procedure. Post-procedure instructions were reviewed.  Patient left the clinic in stable condition.

## 2020-07-19 ENCOUNTER — Other Ambulatory Visit: Payer: Self-pay

## 2020-07-19 ENCOUNTER — Encounter (HOSPITAL_BASED_OUTPATIENT_CLINIC_OR_DEPARTMENT_OTHER): Payer: Self-pay | Admitting: Physical Therapy

## 2020-07-19 ENCOUNTER — Encounter: Payer: Self-pay | Admitting: Orthopaedic Surgery

## 2020-07-19 ENCOUNTER — Ambulatory Visit (HOSPITAL_BASED_OUTPATIENT_CLINIC_OR_DEPARTMENT_OTHER): Payer: Medicare Other | Admitting: Physical Therapy

## 2020-07-19 DIAGNOSIS — R293 Abnormal posture: Secondary | ICD-10-CM | POA: Diagnosis not present

## 2020-07-19 DIAGNOSIS — R600 Localized edema: Secondary | ICD-10-CM

## 2020-07-19 DIAGNOSIS — M6281 Muscle weakness (generalized): Secondary | ICD-10-CM

## 2020-07-19 DIAGNOSIS — M25611 Stiffness of right shoulder, not elsewhere classified: Secondary | ICD-10-CM

## 2020-07-19 DIAGNOSIS — M25511 Pain in right shoulder: Secondary | ICD-10-CM

## 2020-07-19 NOTE — Therapy (Signed)
[facility] [address] [place], [place], [address] Phone: [phone]   Fax:  [phone]  Physical Therapy Treatment  Patient Details  Name: [name] MRN: [id number] Date of Birth: Jan 17, 1951 Referring Provider (PT): Benita Stabile, [place]   Encounter Date: 07/19/2020   PT End of Session - 07/19/20 0958     Visit Number 16    Number of Visits 17    Date for PT Re-Evaluation 08/12/20    Authorization Type Medicare & BCBS progress note perfromed on the 10th visit.    PT Start Time [phone]    PT Stop Time 1003    PT Time Calculation (min) 39 min    Activity Tolerance Patient tolerated treatment well    Behavior During Therapy WFL for tasks assessed/performed             Past Medical History:  Diagnosis Date   Anemia    low iron   Arthritis    Coronary artery disease 2006   a. remote stenting to ramus, prox LAD x2.   Dental crowns present    Diabetes mellitus    First degree AV block    GERD (gastroesophageal reflux disease)    Heart attack ([facility]) 06/2004   Heart murmur    aortic   Hx MRSA infection 02/2006   Hyperlipidemia    Hypertension    hx. of - has not been on med. since losing wt. after gastric bypass   Hypothyroidism    Morbid obesity ([facility])    Mucoid cyst of joint 09/2011   left thumb   Sleep apnea    sleep study 03/29/2011; no CPAP use, lost >130lbs   Trifascicular block     Past Surgical History:  Procedure Laterality Date   AMPUTATION Left 04/19/2016   Procedure: Left Foot 4th Toe Amputation vs. Transmetatarsal;  Surgeon: [name], MD;  Location: [facility];  Service: Orthopedics;  Laterality: Left;   BIOPSY  04/07/2019   Procedure: BIOPSY;  Surgeon: Otis Brace, MD;  [facility];  Service: Gastroenterology;;   CATARACT EXTRACTION  02/2010; 03/2010   COLONOSCOPY WITH PROPOFOL N/A 09/14/2014   Procedure: COLONOSCOPY WITH PROPOFOL;  Surgeon: Garlan Fair, MD;  [facility];   Service: Endoscopy;  Laterality: N/A;   COLONOSCOPY WITH PROPOFOL N/A 04/07/2019   Procedure: COLONOSCOPY WITH PROPOFOL;  Surgeon: Otis Brace, MD;  [facility];  Service: Gastroenterology;  Laterality: N/A;   CORONARY ANGIOPLASTY WITH STENT PLACEMENT  07/12/2004; 08/03/2004   total of 3 stents   I & D EXTREMITY Right 06/04/2014   Procedure: IRRIGATION AND DEBRIDEMENT EXTREMITY;  Surgeon: [name], MD;  Location: [facility];  Service: Orthopedics;  Laterality: Right;   MASS EXCISION  10/04/2011   Procedure: EXCISION MASS;  Surgeon: Wynonia Sours, MD;  Location: [facility];  Service: Orthopedics;  Laterality: Left;  excision cyst debridment ip joint of left thumb   PROSTATECTOMY     right below knee amputation  2018   ROUX-EN-Y GASTRIC BYPASS  10/10/2010   laparoscopic   SHOULDER ARTHROSCOPY WITH ROTATOR CUFF REPAIR AND SUBACROMIAL DECOMPRESSION Right 03/23/2020   Procedure: RIGHT SHOULDER ARTHROSCOPY WITH DEBRIDEMENT AND  ROTATOR CUFF REPAIR;  Surgeon: [name], MD;  Location: [facility];  Service: Orthopedics;  Laterality: Right;   TOE AMPUTATION  02/23/2006   left foot second ray amputation   TOTAL HIP ARTHROPLASTY Right 03/29/2020   Procedure: TOTAL HIP ARTHROPLASTY POSTERIOR APPROACH;  Surgeon: [name],  Marylynn Pearson, MD;  Location: [facility];  Service: Orthopedics;  Laterality: Right;   TRANSMETATARSAL AMPUTATION Left    TRIGGER FINGER RELEASE Right 09/16/2019   Procedure: RELEASE TRIGGER FINGER/A-1 PULLEY;  Surgeon: Daryll Brod, MD;  Location: [facility];  Service: Orthopedics;  Laterality: Right;  IV REGIONAL FOREARM BLOCK    There were no vitals filed for this visit.   Subjective Assessment - 07/19/20 0925     Subjective Patient reported feeling well since last visit. He stated he had trouble sleeping but said it was not his shoulder. Patient reported no pain at time of visit, just light stiffness.    Pertinent History right TTA, right  shoulder arthroscopy, right THA. CHF, CAD, PVD, DM2, MI    Patient Stated Goals to use right shoulder & hip to return to high level of function with his prosthesis.    Currently in Pain? No/denies    Pain Location Shoulder                               [facility] Adult PT Treatment/Exercise - 07/19/20 0001       Knee/Hip Exercises: Sidelying   Other Sidelying Knee/Hip Exercises sidelying er 2x15  1lb      Shoulder Exercises: Supine   Other Supine Exercises wand flexion x20 3lb; right shoulder flexion 1lb x20      Shoulder Exercises: Sidelying   Other Sidelying Exercises Rt ER 2X15 1lb , abd 2X15 (pillow between knees to avoid hip adduction as he has posterior hip precautions)      Shoulder Exercises: Standing   External Rotation Limitations 2x20 green    Internal Rotation Limitations 2x20 blue    Extension Limitations 2x20 blue    Row Limitations 2x20 blue    Other Standing Exercises forward fleixon with towel closed chain 2x10; cabbinet reach 2x10 to first shelf; standing flexion 2x10 2lb; standing Y 2x10 2lbs    Other Standing Exercises shoulder circles 2x10; fwd flexion 2x15      Shoulder Exercises: Pulleys   Flexion 2 minutes    ABduction 2 minutes      Shoulder Exercises: ROM/Strengthening   UBE (Upper Arm Bike) 2 min fwd and 2 back      Manual Therapy   Manual therapy comments Grade 2-3 GH mobs inferior, A-P, distraction, right shoulder,  PROM in all planes to tolerance                    PT Education - 07/19/20 0957     Education Details Reviewed HEP to show correct form.    Person(s) Educated Patient    Methods Explanation;Demonstration    Comprehension Verbalized understanding;Returned demonstration              PT Short Term Goals - 06/01/20 1149       PT SHORT TERM GOAL #1   Title Patient demonstrates understanding of HEP    Status On-going      PT SHORT TERM GOAL #2   Title right shoulder PROM flexion 120*, abduction  100* and Ext Rot 45*    Baseline meet see flowsheets    Status Achieved               PT Long Term Goals - 05/18/20 1607       PT LONG TERM GOAL #1   Title Patient FOTO score 67%    Time 8    Period  Weeks    Status New    Target Date 07/09/20      PT LONG TERM GOAL #2   Title Right shoulder PROM within normal limits    Time 8    Period Weeks    Status New    Target Date 07/09/20      PT LONG TERM GOAL #3   Title Right shoulder strength 5/5    Time 8    Period Weeks    Status New    Target Date 07/09/20      PT LONG TERM GOAL #4   Title Patient reports right shoulder pain 0/10    Time 8    Period Weeks    Status New    Target Date 07/09/20                   Plan - 07/19/20 1202     Clinical Impression Statement Patient continues to progress well. He has full range of motion with minimal pain with end range IR. He requires very little cung for his exercises. He perfromed open chain flexion and scaption today.    Comorbidities right TTA, right shoulder arthroscopy, right THA. CHF, CAD, PVD, DM2, MI    Examination-Activity Limitations Carry;Lift;Locomotion Level;Reach Overhead    Examination-Participation Restrictions Community Activity    Stability/Clinical Decision Making Evolving/Moderate complexity    Clinical Decision Making Low    Rehab Potential Good    PT Frequency 2x / week    PT Duration 8 weeks    PT Treatment/Interventions ADLs/Self Care Home Management;Cryotherapy;Electrical Stimulation;Ultrasound;Therapeutic activities;Therapeutic exercise;Neuromuscular re-education;Patient/family education;Manual techniques;Passive range of motion;Dry needling;Vasopneumatic Device;Joint Manipulations    PT Next Visit Plan update HEP, PROM & manual therapy to increase shoulder range, assess & update plan of care to include hip when Dr. [name] refers    PT Home Exercise Plan Access Code: [email or web address]  URL: [email or web address]  Date: 05/28/2020   Prepared by: Carolyne Littles    Exercises  Supine Scapular Retraction - 1 x daily - 7 x weekly - 2 sets - 10 reps - 5 seconds hold  Standing Scapular Depression - 1 x daily - 7 x weekly - 2 sets - 10 reps - 5 seconds hold  Supine Shoulder Flexion AAROM with Hands Clasped - 3 x daily - 7 x weekly - 2 sets - 10 reps - 10 seconds hold  Supine Shoulder External Rotation in 45 Degrees Abduction AAROM with Dowel - 3 x daily - 7 x weekly - 2 sets - 10 reps - 10 seconds hold  Sidelying Shoulder External Rotation - 1 x daily - 7 x weekly - 3 sets - 10 reps  Shoulder extension with resistance - Neutral - 1 x daily - 7 x weekly - 3 sets - 10 reps  Scapular Retraction with Resistance - 1 x daily - 7 x weekly - 3 sets - 10 reps  advised to do banded restractions if tolerated.    Consulted and Agree with Plan of Care Patient;Family member/caregiver    Family Member Consulted wife             Patient will benefit from skilled therapeutic intervention in order to improve the following deficits and impairments:  Decreased strength, Decreased mobility, Decreased range of motion, Increased edema, Impaired flexibility, Postural dysfunction, Pain  Visit Diagnosis: Acute pain of right shoulder  Stiffness of right shoulder, not elsewhere classified  Abnormal posture  Muscle weakness (generalized)  Localized edema     Problem List Patient  Active Problem List   Diagnosis Date Noted   S/P arthroscopy of right shoulder 06/02/2020   Closed fracture of neck of right femur (H[facility])    Hip fracture ([facility]) 03/27/2020   Nontraumatic complete tear of right rotator cuff 02/26/2020   S/P transmetatarsal amputation of foot, left ([facility]) 04/25/2016   Chronic osteomyelitis of toe, left (HCC)    Cellulitis of toe of left foot    Chest pain 09/25/2015   CAD (coronary artery disease) of artery bypass graft 09/25/2015   Sinus bradycardia on ECG 09/25/2015   Chronic diastolic CHF (congestive heart failure) ([facility]) 09/25/2015    Chest pain at rest 09/25/2015   Aortic valvular disorder 08/04/2014   Disorder of mitral valve 08/04/2014   Diabetic foot infection ([facility]) 06/03/2014   Peripheral vascular disease ([facility]) 06/03/2014   Abscess of foot 06/03/2014   CAD (coronary artery disease) 07/29/2013   Trifascicular block 07/29/2013   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

## 2020-07-20 ENCOUNTER — Other Ambulatory Visit: Payer: Self-pay | Admitting: Physician Assistant

## 2020-07-20 MED ORDER — AMOXICILLIN 500 MG PO CAPS: ORAL_CAPSULE | ORAL | 2 refills | Status: DC

## 2020-07-20 NOTE — Telephone Encounter (Signed)
Sent in

## 2020-07-21 ENCOUNTER — Encounter (HOSPITAL_BASED_OUTPATIENT_CLINIC_OR_DEPARTMENT_OTHER): Payer: Self-pay | Admitting: Physical Therapy

## 2020-07-21 ENCOUNTER — Ambulatory Visit (HOSPITAL_BASED_OUTPATIENT_CLINIC_OR_DEPARTMENT_OTHER): Payer: Medicare Other | Admitting: Physical Therapy

## 2020-07-21 ENCOUNTER — Encounter: Payer: Self-pay | Admitting: Orthopaedic Surgery

## 2020-07-21 ENCOUNTER — Ambulatory Visit (INDEPENDENT_AMBULATORY_CARE_PROVIDER_SITE_OTHER): Payer: Medicare Other | Admitting: Orthopaedic Surgery

## 2020-07-21 ENCOUNTER — Other Ambulatory Visit: Payer: Self-pay

## 2020-07-21 DIAGNOSIS — M25511 Pain in right shoulder: Secondary | ICD-10-CM | POA: Diagnosis not present

## 2020-07-21 DIAGNOSIS — M6281 Muscle weakness (generalized): Secondary | ICD-10-CM

## 2020-07-21 DIAGNOSIS — M25611 Stiffness of right shoulder, not elsewhere classified: Secondary | ICD-10-CM

## 2020-07-21 DIAGNOSIS — R293 Abnormal posture: Secondary | ICD-10-CM

## 2020-07-21 DIAGNOSIS — Z9889 Other specified postprocedural states: Secondary | ICD-10-CM | POA: Diagnosis not present

## 2020-07-21 DIAGNOSIS — R600 Localized edema: Secondary | ICD-10-CM

## 2020-07-21 NOTE — Progress Notes (Signed)
The patient is almost 4 months out from a right shoulder arthroscopy with rotator cuff repair of a cleavage type tear of the rotator cuff.  He reports that he finished his last physical therapy today.  He said the shoulder still tender but much better overall in terms of his pain tolerance and his range of motion and strength.  He did have a lumbar spine injection by Dr. Ernestina Patches about 2 weeks ago.  He says that injection is done great for him and overall he is doing well.  He is also been recovering from a right total hip arthroplasty.  He does have a right below-knee amputation remotely and does wear a prosthesis.  His right shoulder is moving much better overall and has good strength overall and looks great.  From our standpoint he will continue to increase his activities as he tolerates.  He understands that he can take 5 to 6 months to recover from rotator cuff surgery and given the age of his rotator cuff fibers there is certainly a longer recovery and always potential for rerupture.  He will be careful with that shoulder.  All questions and concerns were answered addressed.  Follow-up is as needed.

## 2020-07-22 ENCOUNTER — Encounter (HOSPITAL_BASED_OUTPATIENT_CLINIC_OR_DEPARTMENT_OTHER): Payer: Self-pay | Admitting: Physical Therapy

## 2020-07-22 NOTE — Therapy (Signed)
[facility] [address] [place], [place], [address] Phone: [phone]   Fax:  [phone]  Physical Therapy Treatment/Discharge   Patient Details  Name: [name] MRN: [id number] Date of Birth: [birth year] Referring Provider (PT): Benita Stabile, [place]   Encounter Date: 07/21/2020   PT End of Session - 07/21/20 0948     Visit Number 17    Number of Visits 17    Authorization Type Medicare & BCBS progress note perfromed on the 10th visit.    PT Start Time [id number]    PT Stop Time 1014    PT Time Calculation (min) 41 min    Activity Tolerance Patient tolerated treatment well    Behavior During Therapy WFL for tasks assessed/performed             Past Medical History:  Diagnosis Date   Anemia    low iron   Arthritis    Coronary artery disease 2006   a. remote stenting to ramus, prox LAD x2.   Dental crowns present    Diabetes mellitus    First degree AV block    GERD (gastroesophageal reflux disease)    Heart attack ([facility]) 06/2004   Heart murmur    aortic   Hx MRSA infection 02/2006   Hyperlipidemia    Hypertension    hx. of - has not been on med. since losing wt. after gastric bypass   Hypothyroidism    Morbid obesity ([facility])    Mucoid cyst of joint 09/2011   left thumb   Sleep apnea    sleep study 03/29/2011; no CPAP use, lost >130lbs   Trifascicular block     Past Surgical History:  Procedure Laterality Date   AMPUTATION Left 04/19/2016   Procedure: Left Foot 4th Toe Amputation vs. Transmetatarsal;  Surgeon: [name], MD;  Location: [facility];  Service: Orthopedics;  Laterality: Left;   BIOPSY  04/07/2019   Procedure: BIOPSY;  Surgeon: Otis Brace, MD;  [facility];  Service: Gastroenterology;;   CATARACT EXTRACTION  02/2010; 03/2010   COLONOSCOPY WITH PROPOFOL N/A 09/14/2014   Procedure: COLONOSCOPY WITH PROPOFOL;  Surgeon: Garlan Fair, MD;  [facility];  Service: Endoscopy;  Laterality:  N/A;   COLONOSCOPY WITH PROPOFOL N/A 04/07/2019   Procedure: COLONOSCOPY WITH PROPOFOL;  Surgeon: Otis Brace, MD;  [facility];  Service: Gastroenterology;  Laterality: N/A;   CORONARY ANGIOPLASTY WITH STENT PLACEMENT  07/12/2004; 08/03/2004   total of 3 stents   I & D EXTREMITY Right 06/04/2014   Procedure: IRRIGATION AND DEBRIDEMENT EXTREMITY;  Surgeon: [name], MD;  Location: [facility];  Service: Orthopedics;  Laterality: Right;   MASS EXCISION  10/04/2011   Procedure: EXCISION MASS;  Surgeon: Wynonia Sours, MD;  Location: [facility];  Service: Orthopedics;  Laterality: Left;  excision cyst debridment ip joint of left thumb   PROSTATECTOMY     right below knee amputation  2018   ROUX-EN-Y GASTRIC BYPASS  10/10/2010   laparoscopic   SHOULDER ARTHROSCOPY WITH ROTATOR CUFF REPAIR AND SUBACROMIAL DECOMPRESSION Right 03/23/2020   Procedure: RIGHT SHOULDER ARTHROSCOPY WITH DEBRIDEMENT AND  ROTATOR CUFF REPAIR;  Surgeon: [name], MD;  Location: [facility];  Service: Orthopedics;  Laterality: Right;   TOE AMPUTATION  02/23/2006   left foot second ray amputation   TOTAL HIP ARTHROPLASTY Right 03/29/2020   Procedure: TOTAL HIP ARTHROPLASTY POSTERIOR APPROACH;  Surgeon: [name], MD;  Location: [facility];  Service: Orthopedics;  Laterality: Right;   TRANSMETATARSAL AMPUTATION Left    TRIGGER FINGER RELEASE Right 09/16/2019   Procedure: RELEASE TRIGGER FINGER/A-1 PULLEY;  Surgeon: Daryll Brod, MD;  Location: [facility];  Service: Orthopedics;  Laterality: Right;  IV REGIONAL FOREARM BLOCK    There were no vitals filed for this visit.   Subjective Assessment - 07/21/20 0937     Subjective Patient reported feeling well last visit, "same as usual."    Pertinent History right TTA, right shoulder arthroscopy, right THA. CHF, CAD, PVD, DM2, MI    Patient Stated Goals to use right shoulder & hip to return to high level of function with his  prosthesis.    Currently in Pain? No/denies                [facility] PT Assessment - 07/22/20 0001       PROM   Right Shoulder Flexion 165 Degrees    Right Shoulder Internal Rotation 75 Degrees    Right Shoulder External Rotation 70 Degrees      Strength   Overall Strength Comments 5/5 gross                           [facility] Adult PT Treatment/Exercise - 07/22/20 0001       Self-Care   Self-Care Other Self-Care Comments    Other Self-Care Comments  reviewed how to use HEP and proess HEP. Reviewed symptom mangement      Knee/Hip Exercises: Sidelying   Other Sidelying Knee/Hip Exercises sidelying er 2x15  1lb      Shoulder Exercises: Supine   Other Supine Exercises wand flexion x20 3lb; right shoulder flexion 1lb x20      Shoulder Exercises: Sidelying   Other Sidelying Exercises Rt ER 2X15 1lb , abd 2X15 (pillow between knees to avoid hip adduction as he has posterior hip precautions)      Shoulder Exercises: Standing   External Rotation Limitations 2x20 green    Internal Rotation Limitations 2x20 blue    Extension Limitations 2x20 blue    Row Limitations 2x20 blue    Other Standing Exercises forward fleixon with towel closed chain 2x10; cabbinet reach 2x10 to first shelf; standing flexion 2x10 2lb; standing Y 2x10 2lbs    Other Standing Exercises shoulder circles 2x10; fwd flexion 2x15      Shoulder Exercises: Pulleys   Flexion 2 minutes    ABduction 2 minutes      Shoulder Exercises: ROM/Strengthening   UBE (Upper Arm Bike) 2 min fwd and 2 back      Manual Therapy   Manual therapy comments Grade 2-3 GH mobs inferior, A-P, distraction, right shoulder,  PROM in all planes to tolerance                    PT Education - 07/21/20 0947     Education Details Reviewed HEP and any corrections needed.    Person(s) Educated Patient    Methods Explanation    Comprehension Verbalized understanding;Returned demonstration              PT  Short Term Goals - 07/22/20 1305       PT SHORT TERM GOAL #1   Title Patient demonstrates understanding of HEP    Time 4    Period Weeks    Status Achieved    Target Date 06/11/20      PT SHORT TERM GOAL #2  Title right shoulder PROM flexion 120*, abduction 100* and Ext Rot 45*    Baseline met    Time 4    Period Weeks    Status Achieved    Target Date 06/11/20               PT Long Term Goals - 07/22/20 1305       PT LONG TERM GOAL #1   Title Patient FOTO score 67%    Baseline MET 02/12/2017  Merrilee Jansky 51/56    Time 8    Period Weeks    Status Achieved      PT LONG TERM GOAL #2   Title Right shoulder PROM within normal limits    Baseline met    Time 8    Period Weeks    Status Achieved      PT LONG TERM GOAL #3   Title Right shoulder strength 5/5    Baseline 5/5    Time 8    Period Weeks    Status Achieved      PT LONG TERM GOAL #4   Title Patient reports right shoulder pain 0/10    Baseline pain at night at times    Time 8    Period Weeks    Status Partially Met                   Plan - 07/22/20 1301     Clinical Impression Statement Patient has reached goals for therapy. He continues to have intermittent pain but it has improved. He has full function at this time. he was lacking end range ER today and end range flexion but in past visisits it has been full. he has a full exercise program. Therapy will progress as tolerated.    Personal Factors and Comorbidities Age;Comorbidity 3+    Comorbidities right TTA, right shoulder arthroscopy, right THA. CHF, CAD, PVD, DM2, MI    Examination-Activity Limitations Carry;Lift;Locomotion Level;Reach Overhead    Examination-Participation Restrictions Community Activity    Stability/Clinical Decision Making Evolving/Moderate complexity    Clinical Decision Making Low    Rehab Potential Good    PT Frequency 2x / week    PT Duration 8 weeks    PT Treatment/Interventions ADLs/Self Care Home  Management;Cryotherapy;Electrical Stimulation;Ultrasound;Therapeutic activities;Therapeutic exercise;Neuromuscular re-education;Patient/family education;Manual techniques;Passive range of motion;Dry needling;Vasopneumatic Device;Joint Manipulations    PT Next Visit Plan update HEP, PROM & manual therapy to increase shoulder range, assess & update plan of care to include hip when Dr. [name] refers    PT Home Exercise Plan Access Code: [email or web address]  URL: [email or web address]  Date: 05/28/2020  Prepared by: Carolyne Littles    Exercises  Supine Scapular Retraction - 1 x daily - 7 x weekly - 2 sets - 10 reps - 5 seconds hold  Standing Scapular Depression - 1 x daily - 7 x weekly - 2 sets - 10 reps - 5 seconds hold  Supine Shoulder Flexion AAROM with Hands Clasped - 3 x daily - 7 x weekly - 2 sets - 10 reps - 10 seconds hold  Supine Shoulder External Rotation in 45 Degrees Abduction AAROM with Dowel - 3 x daily - 7 x weekly - 2 sets - 10 reps - 10 seconds hold  Sidelying Shoulder External Rotation - 1 x daily - 7 x weekly - 3 sets - 10 reps  Shoulder extension with resistance - Neutral - 1 x daily - 7 x weekly - 3 sets - 10 reps  Scapular Retraction with  Resistance - 1 x daily - 7 x weekly - 3 sets - 10 reps  advised to do banded restractions if tolerated.    Consulted and Agree with Plan of Care Patient;Family member/caregiver             Patient will benefit from skilled therapeutic intervention in order to improve the following deficits and impairments:  Decreased strength, Decreased mobility, Decreased range of motion, Increased edema, Impaired flexibility, Postural dysfunction, Pain  Visit Diagnosis: Acute pain of right shoulder  Stiffness of right shoulder, not elsewhere classified  Abnormal posture  Muscle weakness (generalized)  Localized edema   PHYSICAL THERAPY DISCHARGE SUMMARY  Visits from Start of Care: 17  Current functional level related to goals / functional  outcomes: Significant improvement in use of his arm   Remaining deficits: Pain at night at times   Education / Equipment: HEP    Patient agrees to discharge. Patient goals were met. Patient is being discharged due to meeting the stated rehab goals.   Problem List Patient Active Problem List   Diagnosis Date Noted   S/P arthroscopy of right shoulder 06/02/2020   Closed fracture of neck of right femur (HCC)    Hip fracture ([facility]) 03/27/2020   Nontraumatic complete tear of right rotator cuff 02/26/2020   S/P transmetatarsal amputation of foot, left ([facility]) 04/25/2016   Chronic osteomyelitis of toe, left (HCC)    Cellulitis of toe of left foot    Chest pain 09/25/2015   CAD (coronary artery disease) of artery bypass graft 09/25/2015   Sinus bradycardia on ECG 09/25/2015   Chronic diastolic CHF (congestive heart failure) ([facility]) 09/25/2015   Chest pain at rest 09/25/2015   Aortic valvular disorder 08/04/2014   Disorder of mitral valve 08/04/2014   Diabetic foot infection ([facility]) 06/03/2014   Peripheral vascular disease ([facility]) 06/03/2014   Abscess of foot 06/03/2014   CAD (coronary artery disease) 07/29/2013   Trifascicular block 07/29/2013   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

## 2020-08-25 DIAGNOSIS — Z20822 Contact with and (suspected) exposure to covid-19: Secondary | ICD-10-CM | POA: Diagnosis not present

## 2020-09-17 ENCOUNTER — Other Ambulatory Visit: Payer: Medicare Other

## 2020-09-23 ENCOUNTER — Ambulatory Visit: Payer: Medicare Other | Admitting: Orthopedic Surgery

## 2020-09-23 ENCOUNTER — Ambulatory Visit (INDEPENDENT_AMBULATORY_CARE_PROVIDER_SITE_OTHER): Payer: Medicare Other | Admitting: Physician Assistant

## 2020-09-23 ENCOUNTER — Ambulatory Visit: Payer: Medicare Other | Admitting: Orthopaedic Surgery

## 2020-09-23 ENCOUNTER — Encounter: Payer: Self-pay | Admitting: Orthopedic Surgery

## 2020-09-23 DIAGNOSIS — M86672 Other chronic osteomyelitis, left ankle and foot: Secondary | ICD-10-CM

## 2020-09-23 NOTE — Progress Notes (Signed)
Office Visit Note   Patient: [name]           Date of Birth: 1950/03/08           MRN: [id number] Visit Date: 09/23/2020              Requested by: [name], MD [facility] [employer] [address],  [place] [address] PCP: [name], MD  Chief Complaint  Patient presents with   Left Foot - Pain, Follow-up      HPI: Patient is a pleasant 70 year old gentleman who is 4 years status post left foot transmetatarsal amputation.  He is also a right lower below-knee amputation amputee.  He has been doing well for 4 years.  He recently got a new filler and carbon fiber plate.  He was on vacation and did a lot of activity.  He noticed a few days ago that he had some drainage on the distal end of his amputation stump.  It is only been clear.  He thinks this is from the new orthotic.  He has since just used the filler plate to provide support.  Assessment & Plan: Visit Diagnoses: No diagnosis found.  Plan I have given him an extra-large vive stump shrinker.  He will cleanse daily with antibacterial soap and water and change to a clean sock.  Follow-up in 2 weeks sooner if any concerns  Follow-Up Instructions: No follow-ups on file.   Ortho Exam  Patient is alert, oriented, no adenopathy, well-dressed, normal affect, normal respiratory effort. Examination he has an easily palpable dorsalis pedis pulse.  He has no swelling he has no cellulitis.  He does have superficial skin breakdown at the distal end of his stump with some minimal clear drainage.  This does not probe deeply and has healthy base tissue at the end.  Just a minimal amount of skin maceration.  Imaging: No results found. No images are attached to the encounter.  Labs: Lab Results  Component Value Date   HGBA1C 6.8 (H) 03/28/2020   HGBA1C 6.5 (H) 09/26/2015   HGBA1C 7.0 (H) 06/04/2014   REPTSTATUS 08/01/2016 FINAL 07/30/2016   GRAMSTAIN  06/04/2014    MODERATE WBC PRESENT, PREDOMINANTLY PMN NO SQUAMOUS  EPITHELIAL CELLS SEEN MODERATE GRAM POSITIVE COCCI IN CLUSTERS Performed at [facility]  06/04/2014    MODERATE WBC PRESENT, PREDOMINANTLY PMN NO SQUAMOUS EPITHELIAL CELLS SEEN MODERATE GRAM POSITIVE COCCI IN PAIRS Performed at [facility]  07/30/2016    NO GROWTH Performed at [facility] [address], [place], [place] [address]      Lab Results  Component Value Date   ALBUMIN 3.5 03/28/2020   ALBUMIN 3.5 03/28/2020   ALBUMIN 3.7 03/27/2020    Lab Results  Component Value Date   MG 2.0 04/02/2020   Lab Results  Component Value Date   VD25OH 53.03 03/28/2020    No results found for: PREALBUMIN CBC EXTENDED Latest Ref Rng & Units 04/01/2020 03/31/2020 03/30/2020  WBC 4.0 - 10.5 K/uL 8.6 9.0 10.3  RBC 4.22 - 5.81 MIL/uL 3.92(L) 3.98(L) 4.17(L)  HGB 13.0 - 17.0 g/dL 12.3(L) 12.5(L) 13.0  HCT 39.0 - 52.0 % 36.0(L) 36.0(L) 38.6(L)  PLT 150 - 400 K/uL 205 181 202  NEUTROABS 1.7 - 7.7 K/uL - - -  LYMPHSABS 0.7 - 4.0 K/uL - - -     There is no height or weight on file to calculate BMI.  Orders:  No orders of the defined types were placed in this encounter.  No orders of the defined types were placed in this encounter.    Procedures: No procedures performed  Clinical Data: No additional findings.  ROS:  All other systems negative, except as noted in the HPI. Review of Systems  Objective: Vital Signs: There were no vitals taken for this visit.  Specialty Comments:  No specialty comments available.  PMFS History: Patient Active Problem List   Diagnosis Date Noted   S/P arthroscopy of right shoulder 06/02/2020   Closed fracture of neck of right femur ([place])    Hip fracture ([place]) 03/27/2020   Nontraumatic complete tear of right rotator cuff 02/26/2020   S/P transmetatarsal amputation of foot, left ([place]) 04/25/2016   Chronic osteomyelitis of toe, left (HCC)    Cellulitis of toe of left foot    Chest pain  09/25/2015   CAD (coronary artery disease) of artery bypass graft 09/25/2015   Sinus bradycardia on ECG 09/25/2015   Chronic diastolic CHF (congestive heart failure) ([place]) 09/25/2015   Chest pain at rest 09/25/2015   Aortic valvular disorder 08/04/2014   Disorder of mitral valve 08/04/2014   Diabetic foot infection ([place]) 06/03/2014   Peripheral vascular disease ([place]) 06/03/2014   Abscess of foot 06/03/2014   CAD (coronary artery disease) 07/29/2013   Trifascicular block 07/29/2013   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 remote stenting to ramus, prox LAD x2.   Dental crowns present    Diabetes mellitus    First degree AV block    GERD (gastroesophageal reflux disease)    Heart attack ([place]) 06/2004   Heart murmur    aortic   Hx MRSA infection 02/2006   Hyperlipidemia    Hypertension    hx. of - has not been on med. since losing wt. after gastric bypass   Hypothyroidism    Morbid obesity ([place])    Mucoid cyst of joint 09/2011   left thumb   Sleep apnea    sleep study 03/29/2011; no CPAP use, lost >130lbs   Trifascicular block     Family History  Problem Relation Age of Onset   Heart disease Mother     Past Surgical History:  Procedure Laterality Date   AMPUTATION Left 04/19/2016   Procedure: Left Foot 4th Toe Amputation vs. Transmetatarsal;  Surgeon: [name], MD;  Location: [facility];  Service: Orthopedics;  Laterality: Left;   BIOPSY  04/07/2019   Procedure: BIOPSY;  Surgeon: Otis Brace, MD;  [facility];  Service: Gastroenterology;;   CATARACT EXTRACTION  02/2010; 03/2010    COLONOSCOPY WITH PROPOFOL N/A 09/14/2014   Procedure: COLONOSCOPY WITH PROPOFOL;  Surgeon: Garlan Fair, MD;  [facility];  Service: Endoscopy;  Laterality: N/A;   COLONOSCOPY WITH PROPOFOL N/A 04/07/2019   Procedure: COLONOSCOPY WITH PROPOFOL;  Surgeon: Otis Brace, MD;  [facility];  Service: Gastroenterology;  Laterality: N/A;   CORONARY ANGIOPLASTY WITH STENT PLACEMENT  07/12/2004; 08/03/2004   total of 3 stents   I & D EXTREMITY Right  06/04/2014   Procedure: IRRIGATION AND DEBRIDEMENT EXTREMITY;  Surgeon: [name], MD;  Location: [facility];  Service: Orthopedics;  Laterality: Right;   MASS EXCISION  10/04/2011   Procedure: EXCISION MASS;  Surgeon: Wynonia Sours, MD;  Location: [facility];  Service: Orthopedics;  Laterality: Left;  excision cyst debridment ip joint of left thumb   PROSTATECTOMY     right below knee amputation  2018   ROUX-EN-Y GASTRIC BYPASS  10/10/2010   laparoscopic   SHOULDER ARTHROSCOPY WITH ROTATOR CUFF REPAIR AND SUBACROMIAL DECOMPRESSION Right 03/23/2020   Procedure: RIGHT SHOULDER ARTHROSCOPY WITH DEBRIDEMENT AND  ROTATOR CUFF REPAIR;  Surgeon: [name], MD;  Location: [facility];  Service: Orthopedics;  Laterality: Right;   TOE AMPUTATION  02/23/2006   left foot second ray amputation   TOTAL HIP ARTHROPLASTY Right 03/29/2020   Procedure: TOTAL HIP ARTHROPLASTY POSTERIOR APPROACH;  Surgeon: [name], MD;  Location: [facility];  Service: Orthopedics;  Laterality: Right;   TRANSMETATARSAL AMPUTATION Left    TRIGGER FINGER RELEASE Right 09/16/2019   Procedure: RELEASE TRIGGER FINGER/A-1 PULLEY;  Surgeon: Daryll Brod, MD;  Location: [facility];  Service: Orthopedics;  Laterality: Right;  IV REGIONAL FOREARM BLOCK   Social History   Occupational History   Occupation: Engineer, maintenance (IT) taxes  Tobacco Use   Smoking status: Never   Smokeless tobacco: Never  Substance and Sexual Activity   Alcohol use: No   Drug  use: No   Sexual activity: Yes

## 2020-09-30 ENCOUNTER — Ambulatory Visit (INDEPENDENT_AMBULATORY_CARE_PROVIDER_SITE_OTHER): Payer: Medicare Other

## 2020-09-30 ENCOUNTER — Ambulatory Visit (INDEPENDENT_AMBULATORY_CARE_PROVIDER_SITE_OTHER): Payer: Medicare Other | Admitting: Orthopaedic Surgery

## 2020-09-30 ENCOUNTER — Encounter: Payer: Self-pay | Admitting: Orthopaedic Surgery

## 2020-09-30 ENCOUNTER — Other Ambulatory Visit: Payer: Self-pay

## 2020-09-30 VITALS — Ht >= 80 in | Wt 245.0 lb

## 2020-09-30 DIAGNOSIS — Z96641 Presence of right artificial hip joint: Secondary | ICD-10-CM | POA: Diagnosis not present

## 2020-09-30 NOTE — Progress Notes (Signed)
Post-Op Visit Note   Patient: [name]           Date of Birth: 01-Apr-1950           MRN: [id number] Visit Date: 09/30/2020 PCP: [name], MD   Assessment & Plan:  Chief Complaint:  Chief Complaint  Patient presents with   Right Hip - Follow-up    Right total hip arthroplasty 03/29/2020   Visit Diagnoses:  1. Status post total replacement of right hip     Plan: Patient is a pleasant 70 year old gentleman who comes in today 59-monthstatus post right posterior hip replacement from a femoral neck fracture, date of surgery 03/29/2020.  He has been doing well.  He notes an occasional twinge when going down the stairs but nothing more.  Overall doing very well.  Examination of his right hip reveals a painless logroll.  Full strength throughout.  He is neurovascular intact distally.  At this point, he will continue to advance with activity as tolerated.  Follow-up with [facility] 6 months time for repeat evaluation and AP pelvis x-rays.  Call with concerns or questions.  Follow-Up Instructions: Return in about 6 months (around 03/30/2021).   Orders:  Orders Placed This Encounter  Procedures   XR Pelvis 1-2 Views   No orders of the defined types were placed in this encounter.   Imaging: No results found.  PMFS History: Patient Active Problem List   Diagnosis Date Noted   S/P arthroscopy of right shoulder 06/02/2020   Closed fracture of neck of right femur ([facility]    Hip fracture ([facility] 03/27/2020   Nontraumatic complete tear of right rotator cuff 02/26/2020   S/P transmetatarsal amputation of foot, left ([facility] 04/25/2016   Chronic osteomyelitis of toe, left (HCC)    Cellulitis of toe of left foot    Chest pain 09/25/2015   CAD (coronary artery disease) of artery bypass graft 09/25/2015   Sinus bradycardia on ECG 09/25/2015   Chronic diastolic CHF (congestive heart failure) ([facility] 09/25/2015   Chest pain at rest 09/25/2015   Aortic valvular disorder 08/04/2014   Disorder of mitral  valve 08/04/2014   Diabetic foot infection ([facility] 06/03/2014   Peripheral vascular disease ([facility] 06/03/2014   Abscess of foot 06/03/2014   CAD (coronary artery disease) 07/29/2013   Trifascicular block 07/29/2013   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 remote stenting to ramus, prox LAD x2.   Dental crowns present    Diabetes mellitus    First degree AV block    GERD (gastroesophageal reflux disease)    Heart attack ([facility] 06/2004   Heart murmur    aortic   Hx MRSA infection 02/2006   Hyperlipidemia    Hypertension    hx. of - has not been on med. since losing wt. after gastric bypass   Hypothyroidism    Morbid obesity ([facility]    Mucoid cyst of joint 09/2011   left thumb   Sleep apnea    sleep study 03/29/2011; no CPAP use, lost >130lbs   Trifascicular block  Family History  Problem Relation Age of Onset   Heart disease Mother     Past Surgical History:  Procedure Laterality Date   AMPUTATION Left 04/19/2016   Procedure: Left Foot 4th Toe Amputation vs. Transmetatarsal;  Surgeon: [name], MD;  Location: [facility];  Service: Orthopedics;  Laterality: Left;   BIOPSY  04/07/2019   Procedure: BIOPSY;  Surgeon: Otis Brace, MD;  [facility];  Service: Gastroenterology;;   CATARACT EXTRACTION  02/2010; 03/2010   COLONOSCOPY WITH PROPOFOL N/A 09/14/2014   Procedure: COLONOSCOPY WITH PROPOFOL;  Surgeon: Garlan Fair, MD;  [facility];  Service: Endoscopy;  Laterality: N/A;   COLONOSCOPY WITH PROPOFOL N/A 04/07/2019   Procedure: COLONOSCOPY WITH PROPOFOL;  Surgeon: Otis Brace, MD;  [facility];  Service: Gastroenterology;  Laterality: N/A;   CORONARY ANGIOPLASTY WITH STENT PLACEMENT  07/12/2004; 08/03/2004   total of 3 stents   I & D EXTREMITY Right 06/04/2014   Procedure: IRRIGATION AND DEBRIDEMENT EXTREMITY;  Surgeon: [name], MD;  Location: [facility];  Service: Orthopedics;  Laterality: Right;   MASS EXCISION  10/04/2011   Procedure: EXCISION MASS;  Surgeon: Wynonia Sours, MD;  Location: [facility];  Service: Orthopedics;  Laterality: Left;  excision cyst debridment ip joint of left thumb   PROSTATECTOMY     right below knee amputation  2018   ROUX-EN-Y GASTRIC BYPASS  10/10/2010   laparoscopic   SHOULDER ARTHROSCOPY WITH ROTATOR CUFF REPAIR AND SUBACROMIAL DECOMPRESSION Right 03/23/2020   Procedure: RIGHT SHOULDER ARTHROSCOPY WITH DEBRIDEMENT AND  ROTATOR CUFF REPAIR;  Surgeon: [name], MD;  Location: [facility];  Service: Orthopedics;  Laterality: Right;   TOE AMPUTATION  02/23/2006   left foot second ray amputation   TOTAL HIP ARTHROPLASTY Right 03/29/2020   Procedure: TOTAL HIP ARTHROPLASTY POSTERIOR APPROACH;  Surgeon: [name], MD;  Location: [facility];  Service: Orthopedics;  Laterality: Right;   TRANSMETATARSAL AMPUTATION Left    TRIGGER FINGER RELEASE Right 09/16/2019   Procedure: RELEASE TRIGGER FINGER/A-1 PULLEY;  Surgeon: Daryll Brod, MD;  Location: [facility];  Service: Orthopedics;  Laterality: Right;  IV REGIONAL FOREARM BLOCK   Social History   Occupational History   Occupation: Engineer, maintenance (IT) taxes  Tobacco Use   Smoking status: Never   Smokeless tobacco: Never  Substance and Sexual Activity   Alcohol use: No   Drug use: No   Sexual activity: Yes

## 2020-10-01 DIAGNOSIS — K219 Gastro-esophageal reflux disease without esophagitis: Secondary | ICD-10-CM | POA: Diagnosis not present

## 2020-10-01 DIAGNOSIS — E1149 Type 2 diabetes mellitus with other diabetic neurological complication: Secondary | ICD-10-CM | POA: Diagnosis not present

## 2020-10-01 DIAGNOSIS — I10 Essential (primary) hypertension: Secondary | ICD-10-CM | POA: Diagnosis not present

## 2020-10-05 ENCOUNTER — Encounter: Payer: Self-pay | Admitting: Family

## 2020-10-05 ENCOUNTER — Ambulatory Visit (INDEPENDENT_AMBULATORY_CARE_PROVIDER_SITE_OTHER): Payer: Medicare Other | Admitting: Family

## 2020-10-05 DIAGNOSIS — Z89511 Acquired absence of right leg below knee: Secondary | ICD-10-CM

## 2020-10-05 DIAGNOSIS — S88111A Complete traumatic amputation at level between knee and ankle, right lower leg, initial encounter: Secondary | ICD-10-CM

## 2020-10-06 NOTE — Progress Notes (Signed)
Office Visit Note   Patient: [name]           Date of Birth: [birth year]           MRN: [id number] Visit Date: 10/05/2020              Requested by: [name], MD [facility] [employer] [address] [place],  [place] [address] PCP: [name], MD  No chief complaint on file.     HPI: The patient is a 70 year old gentleman seen today for prosthetic evaluation.  He has below-knee amputation on the right he is currently in his initial prosthesis has been in this for the last 4 years he has had significant volume loss and now this is ill fitting.  He states he has been having to wear 18 ply distally and about 10 ply proximally.  His prosthetists has also added significant padding in the socket despite this there is a loose fit   Assessment & Plan: Visit Diagnoses: No diagnosis found.  Plan: Given an order today for new prosthesis set up on the right.  He will follow-up in the office as needed  Follow-Up Instructions: No follow-ups on file.   Ortho Exam  Patient is alert, oriented, no adenopathy, well-dressed, normal affect, normal respiratory effort. The right residual limb is well consolidated well-healed there is no open ulceration  Imaging: No results found. No images are attached to the encounter.  Labs: Lab Results  Component Value Date   HGBA1C 6.8 (H) 03/28/2020   HGBA1C 6.5 (H) 09/26/2015   HGBA1C 7.0 (H) 06/04/2014   REPTSTATUS 08/01/2016 FINAL 07/30/2016   GRAMSTAIN  06/04/2014    MODERATE WBC PRESENT, PREDOMINANTLY PMN NO SQUAMOUS EPITHELIAL CELLS SEEN MODERATE GRAM POSITIVE COCCI IN CLUSTERS Performed at [facility]  06/04/2014    MODERATE WBC PRESENT, PREDOMINANTLY PMN NO SQUAMOUS EPITHELIAL CELLS SEEN MODERATE GRAM POSITIVE COCCI IN PAIRS Performed at [facility]  07/30/2016    NO GROWTH Performed at [facility] [address] [place], [place] [address]      Lab Results  Component Value Date    ALBUMIN 3.5 03/28/2020   ALBUMIN 3.5 03/28/2020   ALBUMIN 3.7 03/27/2020    Lab Results  Component Value Date   MG 2.0 04/02/2020   Lab Results  Component Value Date   VD25OH 53.03 03/28/2020    No results found for: PREALBUMIN CBC EXTENDED Latest Ref Rng & Units 04/01/2020 03/31/2020 03/30/2020  WBC 4.0 - 10.5 K/uL 8.6 9.0 10.3  RBC 4.22 - 5.81 MIL/uL 3.92(L) 3.98(L) 4.17(L)  HGB 13.0 - 17.0 g/dL 12.3(L) 12.5(L) 13.0  HCT 39.0 - 52.0 % 36.0(L) 36.0(L) 38.6(L)  PLT 150 - 400 K/uL 205 181 202  NEUTROABS 1.7 - 7.7 K/uL - - -  LYMPHSABS 0.7 - 4.0 K/uL - - -     There is no height or weight on file to calculate BMI.  Orders:  No orders of the defined types were placed in this encounter.  No orders of the defined types were placed in this encounter.    Procedures: No procedures performed  Clinical Data: No additional findings.  ROS:  All other systems negative, except as noted in the HPI. Review of Systems  Objective: Vital Signs: There were no vitals taken for this visit.  Specialty Comments:  No specialty comments available.  PMFS History: Patient Active Problem List   Diagnosis Date Noted   S/P  arthroscopy of right shoulder 06/02/2020   Closed fracture of neck of right femur ([place])    Hip fracture ([place]) 03/27/2020   Nontraumatic complete tear of right rotator cuff 02/26/2020   S/P transmetatarsal amputation of foot, left ([place]) 04/25/2016   Chronic osteomyelitis of toe, left (HCC)    Cellulitis of toe of left foot    Chest pain 09/25/2015   CAD (coronary artery disease) of artery bypass graft 09/25/2015   Sinus bradycardia on ECG 09/25/2015   Chronic diastolic CHF (congestive heart failure) ([place]) 09/25/2015   Chest pain at rest 09/25/2015   Aortic valvular disorder 08/04/2014   Disorder of mitral valve 08/04/2014   Diabetic foot infection ([place]) 06/03/2014   Peripheral vascular disease ([place]) 06/03/2014   Abscess of foot 06/03/2014   CAD (coronary artery  disease) 07/29/2013   Trifascicular block 07/29/2013   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 remote stenting to ramus, prox LAD x2.   Dental crowns present    Diabetes mellitus    First degree AV block    GERD (gastroesophageal reflux disease)    Heart attack ([place]) 06/2004   Heart murmur    aortic   Hx MRSA infection 02/2006   Hyperlipidemia    Hypertension    hx. of - has not been on med. since losing wt. after gastric bypass   Hypothyroidism    Morbid obesity ([place])    Mucoid cyst of joint 09/2011   left thumb   Sleep apnea    sleep study 03/29/2011; no CPAP use, lost >130lbs   Trifascicular block     Family History  Problem Relation Age of Onset   Heart disease Mother     Past Surgical History:  Procedure Laterality Date   AMPUTATION Left 04/19/2016   Procedure: Left Foot 4th Toe Amputation vs. Transmetatarsal;  Surgeon: [name], MD;  Location: [facility];  Service: Orthopedics;  Laterality: Left;   BIOPSY  04/07/2019   Procedure: BIOPSY;  Surgeon: Otis Brace, MD;  [facility];  Service: Gastroenterology;;   CATARACT EXTRACTION  02/2010; 03/2010   COLONOSCOPY WITH PROPOFOL N/A 09/14/2014   Procedure: COLONOSCOPY WITH PROPOFOL;  Surgeon: Garlan Fair, MD;  [facility];  Service: Endoscopy;  Laterality: N/A;   COLONOSCOPY WITH PROPOFOL N/A 04/07/2019   Procedure: COLONOSCOPY WITH PROPOFOL;  Surgeon: Otis Brace, MD;  [facility];  Service: Gastroenterology;  Laterality: N/A;   CORONARY ANGIOPLASTY WITH STENT PLACEMENT  07/12/2004; 08/03/2004   total of 3  stents   I & D EXTREMITY Right 06/04/2014   Procedure: IRRIGATION AND DEBRIDEMENT EXTREMITY;  Surgeon: [name], MD;  Location: [facility];  Service: Orthopedics;  Laterality: Right;   MASS EXCISION  10/04/2011   Procedure: EXCISION MASS;  Surgeon: Wynonia Sours, MD;  Location: [facility];  Service: Orthopedics;  Laterality: Left;  excision cyst debridment ip joint of left thumb   PROSTATECTOMY     right below knee amputation  2018   ROUX-EN-Y GASTRIC BYPASS  10/10/2010   laparoscopic   SHOULDER ARTHROSCOPY  WITH ROTATOR CUFF REPAIR AND SUBACROMIAL DECOMPRESSION Right 03/23/2020   Procedure: RIGHT SHOULDER ARTHROSCOPY WITH DEBRIDEMENT AND  ROTATOR CUFF REPAIR;  Surgeon: [name], MD;  Location: [facility];  Service: Orthopedics;  Laterality: Right;   TOE AMPUTATION  02/23/2006   left foot second ray amputation   TOTAL HIP ARTHROPLASTY Right 03/29/2020   Procedure: TOTAL HIP ARTHROPLASTY POSTERIOR APPROACH;  Surgeon: [name], MD;  Location: [facility];  Service: Orthopedics;  Laterality: Right;   TRANSMETATARSAL AMPUTATION Left    TRIGGER FINGER RELEASE Right 09/16/2019   Procedure: RELEASE TRIGGER FINGER/A-1 PULLEY;  Surgeon: Daryll Brod, MD;  Location: [facility];  Service: Orthopedics;  Laterality: Right;  IV REGIONAL FOREARM BLOCK   Social History   Occupational History   Occupation: Engineer, maintenance (IT) taxes  Tobacco Use   Smoking status: Never   Smokeless tobacco: Never  Substance and Sexual Activity   Alcohol use: No   Drug use: No   Sexual activity: Yes

## 2020-10-07 ENCOUNTER — Ambulatory Visit: Payer: Medicare Other | Admitting: Orthopedic Surgery

## 2020-10-10 DIAGNOSIS — Z23 Encounter for immunization: Secondary | ICD-10-CM | POA: Diagnosis not present

## 2020-10-14 DIAGNOSIS — Z23 Encounter for immunization: Secondary | ICD-10-CM | POA: Diagnosis not present

## 2020-10-29 ENCOUNTER — Ambulatory Visit
Admission: RE | Admit: 2020-10-29 | Discharge: 2020-10-29 | Disposition: A | Discharge status: Home / Self Care | Payer: Medicare Other | Source: Ambulatory Visit | Attending: Internal Medicine | Admitting: Internal Medicine

## 2020-10-29 ENCOUNTER — Other Ambulatory Visit: Payer: Self-pay

## 2020-10-29 DIAGNOSIS — M8589 Other specified disorders of bone density and structure, multiple sites: Secondary | ICD-10-CM | POA: Diagnosis not present

## 2020-10-29 DIAGNOSIS — Z8781 Personal history of (healed) traumatic fracture: Secondary | ICD-10-CM

## 2020-11-15 DIAGNOSIS — L570 Actinic keratosis: Secondary | ICD-10-CM | POA: Diagnosis not present

## 2020-11-15 DIAGNOSIS — L821 Other seborrheic keratosis: Secondary | ICD-10-CM | POA: Diagnosis not present

## 2020-11-15 DIAGNOSIS — L905 Scar conditions and fibrosis of skin: Secondary | ICD-10-CM | POA: Diagnosis not present

## 2020-11-15 DIAGNOSIS — L814 Other melanin hyperpigmentation: Secondary | ICD-10-CM | POA: Diagnosis not present

## 2020-11-15 DIAGNOSIS — D2372 Other benign neoplasm of skin of left lower limb, including hip: Secondary | ICD-10-CM | POA: Diagnosis not present

## 2020-11-15 DIAGNOSIS — D2261 Melanocytic nevi of right upper limb, including shoulder: Secondary | ICD-10-CM | POA: Diagnosis not present

## 2020-11-15 DIAGNOSIS — D692 Other nonthrombocytopenic purpura: Secondary | ICD-10-CM | POA: Diagnosis not present

## 2020-11-15 DIAGNOSIS — D1801 Hemangioma of skin and subcutaneous tissue: Secondary | ICD-10-CM | POA: Diagnosis not present

## 2020-11-15 DIAGNOSIS — D225 Melanocytic nevi of trunk: Secondary | ICD-10-CM | POA: Diagnosis not present

## 2020-11-24 DIAGNOSIS — R2232 Localized swelling, mass and lump, left upper limb: Secondary | ICD-10-CM | POA: Diagnosis not present

## 2020-11-24 DIAGNOSIS — M79645 Pain in left finger(s): Secondary | ICD-10-CM | POA: Diagnosis not present

## 2020-11-24 DIAGNOSIS — M1812 Unilateral primary osteoarthritis of first carpometacarpal joint, left hand: Secondary | ICD-10-CM | POA: Diagnosis not present

## 2021-01-07 ENCOUNTER — Ambulatory Visit (INDEPENDENT_AMBULATORY_CARE_PROVIDER_SITE_OTHER): Payer: Medicare Other | Admitting: Cardiology

## 2021-01-07 ENCOUNTER — Other Ambulatory Visit: Payer: Self-pay

## 2021-01-07 ENCOUNTER — Encounter: Payer: Self-pay | Admitting: Cardiology

## 2021-01-07 VITALS — BP 130/80 | HR 61 | Ht >= 80 in | Wt 244.0 lb

## 2021-01-07 DIAGNOSIS — I2510 Atherosclerotic heart disease of native coronary artery without angina pectoris: Secondary | ICD-10-CM | POA: Diagnosis not present

## 2021-01-07 DIAGNOSIS — I959 Hypotension, unspecified: Secondary | ICD-10-CM | POA: Insufficient documentation

## 2021-01-07 DIAGNOSIS — I951 Orthostatic hypotension: Secondary | ICD-10-CM

## 2021-01-07 DIAGNOSIS — I453 Trifascicular block: Secondary | ICD-10-CM

## 2021-01-07 DIAGNOSIS — E782 Mixed hyperlipidemia: Secondary | ICD-10-CM | POA: Diagnosis not present

## 2021-01-07 DIAGNOSIS — Z9884 Bariatric surgery status: Secondary | ICD-10-CM | POA: Diagnosis not present

## 2021-01-07 DIAGNOSIS — S72001D Fracture of unspecified part of neck of right femur, subsequent encounter for closed fracture with routine healing: Secondary | ICD-10-CM

## 2021-01-07 DIAGNOSIS — I739 Peripheral vascular disease, unspecified: Secondary | ICD-10-CM | POA: Diagnosis not present

## 2021-01-07 DIAGNOSIS — Z89432 Acquired absence of left foot: Secondary | ICD-10-CM | POA: Diagnosis not present

## 2021-01-07 DIAGNOSIS — I350 Nonrheumatic aortic (valve) stenosis: Secondary | ICD-10-CM | POA: Diagnosis not present

## 2021-01-07 NOTE — Assessment & Plan Note (Signed)
Unfortunately had right hip replacement 7 days after his right shoulder replacement.  Fell mechanically while trying to put his right leg prosthesis on.

## 2021-01-07 NOTE — Assessment & Plan Note (Signed)
In 2018 his aortic stenosis was mild.  I do appreciate advancement of murmur on exam today.  We will repeat echocardiogram.  He is not having any shortness of breath.

## 2021-01-07 NOTE — Assessment & Plan Note (Signed)
Previously we decreased his lisinopril from 5 down to 2.5 mg because of blood pressure decreased/lightheadedness when getting up from a seated position.  Improved.

## 2021-01-07 NOTE — Assessment & Plan Note (Signed)
Continue with aspirin, statin, good blood pressure control.  Good diabetes control.

## 2021-01-07 NOTE — Assessment & Plan Note (Signed)
Prior PCI to ramus in 2006, 2 prior stents to the proximal LAD.  Diffuse distal disease in the RCA.  Prior nuclear stress in 2017 showed a fixed inferior defect.  Overall stable.  Continue with aspirin 81 mg, he is not on beta-blocker because of conduction delays.

## 2021-01-07 NOTE — Patient Instructions (Signed)
Medication Instructions:  The current medical regimen is effective;  continue present plan and medications.  *If you need a refill on your cardiac medications before your next appointment, please call your pharmacy*  Testing/Procedures: Your physician has requested that you have an echocardiogram. Echocardiography is a painless test that uses sound waves to create images of your heart. It provides your doctor with information about the size and shape of your heart and how well your heart's chambers and valves are working. This procedure takes approximately one hour. There are no restrictions for this procedure.  Follow-Up: At [facility], you and your health needs are our priority.  As part of our continuing mission to provide you with exceptional heart care, we have created designated Provider Care Teams.  These Care Teams include your primary Cardiologist (physician) and Advanced Practice Providers (APPs -  Physician Assistants and Nurse Practitioners) who all work together to provide you with the care you need, when you need it.  We recommend signing up for the patient portal called "MyChart".  Sign up information is provided on this After Visit Summary.  MyChart is used to connect with patients for Virtual Visits (Telemedicine).  Patients are able to view lab/test results, encounter notes, upcoming appointments, etc.  Non-urgent messages can be sent to your provider as well.   To learn more about what you can do with MyChart, go to [email or web address].    Your next appointment:   1 year(s)  The format for your next appointment:   In Person  Provider:   Mark Skains, MD     Thank you for choosing [facility]!!    

## 2021-01-07 NOTE — Progress Notes (Signed)
Cardiology Office Note:    Date:  01/07/2021   ID:  [name], DOB September 17, 1950, MRN [id number]  PCP:  Lucas Orn, MD  [facility] Cardiologist:  Lucas Furbish, MD  [facility] Electrophysiologist:  None   Referring MD: Lucas Orn, MD     History of Present Illness:    [name] is a 70 y.o. male with coronary disease hypertension hyperlipidemia diabetes type 2 OSA here for follow-up.  PCI to ramus in 2006.  2 stents to the proximal LAD.  Diffuse disease in distal RCA.  Nuclear stress test 2017 showed fixed inferior defect.  Underwent gastric bypass surgery in September 2012.  Good weight loss.  First-degree AV block bifascicular block sinus bradycardia noted previously.  Echocardiogram with 42 mm dilated aortic root, mild aortic stenosis and mild aortic regurgitation.  EF of 45 to 50%.  He was last seen by [name].  Underwent orthopedic surgery on hand, doing well.  Overall been doing quite well no fevers chills nausea vomiting syncope bleeding.  Is having some dizziness when getting up from a seated position.  Occasionally.  No chest pain  Today, (01/07/2021)  His EKG is stable during this visit. He denies having any episodes of syncope, chest pain.   During last visit he had an episode of feeling light headed after getting up fast, since then he has no [name] episodes. He reduced his lisinopril and reports no new issues while taking it.   He continues taking atorvastatin and reports no new issues while taking it. He denies having any muscle aches  He reports last march breaking his great toe on his right foot. He is recovering well at this time.    Past Medical History:  Diagnosis Date   Anemia    low iron   Arthritis    Coronary artery disease 2006   a. remote stenting to ramus, prox LAD x2.   Dental crowns present    Diabetes mellitus    First degree AV block    GERD (gastroesophageal reflux disease)    Heart attack ([place]) 06/2004   Heart murmur     aortic   Hx MRSA infection 02/2006   Hyperlipidemia    Hypertension    hx. of - has not been on med. since losing wt. after gastric bypass   Hypothyroidism    Morbid obesity ([place])    Mucoid cyst of joint 09/2011   left thumb   Sleep apnea    sleep study 03/29/2011; no CPAP use, lost >130lbs   Trifascicular block     Past Surgical History:  Procedure Laterality Date   AMPUTATION Left 04/19/2016   Procedure: Left Foot 4th Toe Amputation vs. Transmetatarsal;  Surgeon: [name], MD;  Location: [place];  Service: Orthopedics;  Laterality: Left;   BIOPSY  04/07/2019   Procedure: BIOPSY;  Surgeon: Otis Brace, MD;  [facility];  Service: Gastroenterology;;   CATARACT EXTRACTION  02/2010; 03/2010   COLONOSCOPY WITH PROPOFOL N/A 09/14/2014   Procedure: COLONOSCOPY WITH PROPOFOL;  Surgeon: Garlan Fair, MD;  [facility];  Service: Endoscopy;  Laterality: N/A;   COLONOSCOPY WITH PROPOFOL N/A 04/07/2019   Procedure: COLONOSCOPY WITH PROPOFOL;  Surgeon: Otis Brace, MD;  [facility];  Service: Gastroenterology;  Laterality: N/A;   CORONARY ANGIOPLASTY WITH STENT PLACEMENT  07/12/2004; 08/03/2004   total of 3 stents   I & D EXTREMITY Right 06/04/2014   Procedure: IRRIGATION AND DEBRIDEMENT EXTREMITY;  Surgeon: Harrell Gave  Kerry Fort, MD;  Location: [place];  Service: Orthopedics;  Laterality: Right;   MASS EXCISION  10/04/2011   Procedure: EXCISION MASS;  Surgeon: Wynonia Sours, MD;  Location: [place];  Service: Orthopedics;  Laterality: Left;  excision cyst debridment ip joint of left thumb   PROSTATECTOMY     right below knee amputation  2018   ROUX-EN-Y GASTRIC BYPASS  10/10/2010   laparoscopic   SHOULDER ARTHROSCOPY WITH ROTATOR CUFF REPAIR AND SUBACROMIAL DECOMPRESSION Right 03/23/2020   Procedure: RIGHT SHOULDER ARTHROSCOPY WITH DEBRIDEMENT AND  ROTATOR CUFF REPAIR;  Surgeon: [name], MD;  Location: [place];  Service:  Orthopedics;  Laterality: Right;   TOE AMPUTATION  02/23/2006   left foot second ray amputation   TOTAL HIP ARTHROPLASTY Right 03/29/2020   Procedure: TOTAL HIP ARTHROPLASTY POSTERIOR APPROACH;  Surgeon: [name], MD;  Location: [place];  Service: Orthopedics;  Laterality: Right;   TRANSMETATARSAL AMPUTATION Left    TRIGGER FINGER RELEASE Right 09/16/2019   Procedure: RELEASE TRIGGER FINGER/A-1 PULLEY;  Surgeon: Daryll Brod, MD;  Location: [place];  Service: Orthopedics;  Laterality: Right;  IV REGIONAL FOREARM BLOCK    Current Medications: Current Meds  Medication Sig   amoxicillin (AMOXIL) 500 MG capsule Take 4 pills one hour prior to dental work   aspirin EC 81 MG tablet Take 81 mg by mouth daily.   atorvastatin (LIPITOR) 10 MG tablet Take 10 mg by mouth at bedtime.    ferrous sulfate 325 (65 FE) MG tablet Take 325 mg by mouth daily.    fluticasone (FLONASE) 50 MCG/ACT nasal spray Place 2 sprays into both nostrils daily as needed for allergies.    Hypromellose (GENTEAL SEVERE OP) Place 1 drop into the right eye daily as needed (Dry eyes).   levothyroxine (SYNTHROID) 150 MCG tablet Take 150 mcg by mouth daily before breakfast.   lisinopril (ZESTRIL) 2.5 MG tablet Take 1 tablet (2.5 mg total) by mouth daily.   Melatonin 10 MG TABS Take 10 mg by mouth at bedtime.   metFORMIN (GLUCOPHAGE-XR) 500 MG 24 hr tablet Take 500 mg by mouth 2 (two) times daily.   Multiple Vitamin (MULTIVITAMIN WITH MINERALS) TABS tablet Take 1 tablet by mouth in the morning and at bedtime. Gummies   nitroGLYCERIN (NITROSTAT) 0.4 MG SL tablet PLACE 1 TABLET (0.4 MG TOTAL) UNDER THE TONGUE EVERY 5 (FIVE) MINUTES X 3 DOSES AS NEEDED FOR CHEST PAIN.   Omeprazole 20 MG TBEC Take 20 mg by mouth daily as needed (acid reflux/ indigestion).      Allergies:   [name], Moxifloxacin, Penicillins, Quinolones, and Sulfamethoxazole-trimethoprim   Social History   Socioeconomic History   Marital status: Married     Spouse name: Not on file   Number of children: Not on file   Years of education: Not on file   Highest education level: Not on file  Occupational History   Occupation: Engineer, maintenance (IT) taxes  Tobacco Use   Smoking status: Never   Smokeless tobacco: Never  Substance and Sexual Activity   Alcohol use: No   Drug use: No   Sexual activity: Yes  [name] Topics Concern   Not on file  Social History Narrative   Parents and sister all using CPAP   Social Determinants of Health   Financial Resource Strain: Not on file  Food Insecurity: Not on file  Transportation Needs: Not on file  Physical Activity: Not on file  Stress: Not on file  Social Connections: Not on file     Family History: The patient's family history includes Heart disease in his mother.  ROS:   Please see the history of present illness.     All [name] systems reviewed and are negative.  (-)Chest pain (-)Syncope (-)Lightheadedness (-)Muscle aches  EKGs/Labs/[name] Studies Reviewed:    The following studies were reviewed today: above  EKG:   01/07/2021-  Sinus rhythm 61 First degree av block Left anterior ventricular block  Bifascicular block  Echocardiogram- - Left ventricle: The cavity size was normal. Wall thickness was    increased in a pattern of moderate LVH. Systolic function was    mildly reduced. The estimated ejection fraction was in the range    of 45% to 50%. Inferior hypokinesis. Doppler parameters are    consistent with abnormal left ventricular relaxation (grade 1    diastolic dysfunction). The E/e&' ratio is between 8-15,    suggesting indeterminate LV filling pressure.  - Aortic valve: Trileaflet; mildly calcified leaflets. Mild    stenosis. There was mild regurgitation. Mean gradient (S): 14 mm    Hg. Peak gradient (S): 27 mm Hg.  - Aorta: Aortic root dimension: 42 mm (ED).  - Aortic root: The aortic root is mildly dilated.  - Mitral valve: Mildly thickened leaflets . There was mild     regurgitation.  - Left atrium: The atrium was normal in size.  - Right ventricle: Systolic function is mildly reduced.  - Right atrium: The atrium was normal in size.  - Inferior vena cava: The vessel was normal in size. The    respirophasic diameter changes were in the normal range (>= 50%),    consistent with normal central venous pressure.   Recent Labs: 03/28/2020: ALT 17 04/01/2020: Hemoglobin 12.3; Platelets 205 04/02/2020: BUN 15; Creatinine, Ser 0.84; Magnesium 2.0; Potassium 3.7; Sodium 133  Recent Lipid Panel No results found for: CHOL, TRIG, HDL, CHOLHDL, VLDL, LDLCALC, LDLDIRECT   Risk Assessment/Calculations:       Physical Exam:    VS:  BP 130/80 (BP Location: Left Arm, Patient Position: Sitting, Cuff Size: Normal)    Pulse 61    Ht 6\' 8"  (2.032 m)    Wt 244 lb (110.7 kg)    SpO2 98%    BMI 26.80 kg/m     Wt Readings from Last 3 Encounters:  01/07/21 244 lb (110.7 kg)  09/30/20 245 lb (111.1 kg)  06/17/20 245 lb (111.1 kg)     GEN:  Well nourished, well developed in no acute distress HEENT: Normal NECK: No JVD; No carotid bruits LYMPHATICS: No lymphadenopathy CARDIAC: RRR, no rubs, gallops, 2/6 systolic murmer RESPIRATORY:  Clear to auscultation without rales, wheezing or rhonchi  ABDOMEN: Soft, non-tender, non-distended MUSCULOSKELETAL:  No edema; right lower extremity amputation SKIN: Warm and dry NEUROLOGIC:  Alert and oriented x 3 PSYCHIATRIC:  Normal affect   ASSESSMENT:    1. Nonrheumatic aortic valve stenosis   2. Trifascicular block   3. Coronary artery disease involving native coronary artery of native heart without angina pectoris   4. S/P transmetatarsal amputation of foot, left ([place])   5. HYPERLIPIDEMIA, MIXED   6. 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   7. Orthostatic hypotension   8. Peripheral vascular disease ([place])   9. Closed fracture of right hip with routine healing, subsequent encounter     Trifascicular block Stable on EKG.   May need pacemaker in the future.  Overall doing well without any  high risk symptoms such as syncope.  Coronary artery disease involving native coronary artery of native heart without angina pectoris Prior PCI to ramus in 2006, 2 prior stents to the proximal LAD.  Diffuse distal disease in the RCA.  Prior nuclear stress in 2017 showed a fixed inferior defect.  Overall stable.  Continue with aspirin 81 mg, he is not on beta-blocker because of conduction delays.  S/P transmetatarsal amputation of foot, left (HCC) Stable.  HYPERLIPIDEMIA, MIXED Atorvastatin 10 mg.  No myalgias.  LDL 65.  Excellent.  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  Overall doing well.  Maximal weight at 1 point was 361 pounds.  Hypotension Previously we decreased his lisinopril from 5 down to 2.5 mg because of blood pressure decreased/lightheadedness when getting up from a seated position.  Improved.  Aortic stenosis In 2018 his aortic stenosis was mild.  I do appreciate advancement of murmur on exam today.  We will repeat echocardiogram.  He is not having any shortness of breath.  Peripheral vascular disease Continue with aspirin, statin, good blood pressure control.  Good diabetes control.  Hip fracture ([place]) Unfortunately had right hip replacement 7 days after his right shoulder replacement.  Fell mechanically while trying to put his right leg prosthesis on.      Medication Adjustments/Labs and Tests Ordered: Current medicines are reviewed at length with the patient today.  Concerns regarding medicines are outlined above.  Orders Placed This Encounter  Procedures   EKG 12-Lead   ECHOCARDIOGRAM COMPLETE   No orders of the defined types were placed in this encounter.   Patient Instructions  Medication Instructions:  The current medical regimen is effective;  continue present plan and medications.  *If you need a refill on your cardiac medications before your next appointment,  please call your pharmacy*  Testing/Procedures: Your physician has requested that you have an echocardiogram. Echocardiography is a painless test that uses sound waves to create images of your heart. It provides your doctor with information about the size and shape of your heart and how well your hearts chambers and valves are working. This procedure takes approximately one hour. There are no restrictions for this procedure.  Follow-Up: At [facility], you and your health needs are our priority.  As part of our continuing mission to provide you with exceptional heart care, we have created designated Provider Care Teams.  These Care Teams include your primary Cardiologist (physician) and Advanced Practice Providers (APPs -  Physician Assistants and Nurse Practitioners) who all work together to provide you with the care you need, when you need it.  We recommend signing up for the patient portal called "MyChart".  Sign up information is provided on this After Visit Summary.  MyChart is used to connect with patients for Virtual Visits (Telemedicine).  Patients are able to view lab/test results, encounter notes, upcoming appointments, etc.  Non-urgent messages can be sent to your provider as well.   To learn more about what you can do with MyChart, go to [email or web address].    Your next appointment:   1 year(s)  The format for your next appointment:   In Person  Provider:   Candee Furbish, MD     Thank you for choosing [facility]!!      Signed, Lucas Furbish, MD  01/07/2021 10:40 AM    [place]   I,Shehryar Baig,acting as a scribe for Lucas Furbish, MD.,have documented all relevant documentation on the  behalf of Lucas Furbish, MD,as directed by  Lucas Furbish, MD while in the presence of Lucas Furbish, MD.  .I, Lucas Furbish, MD, have reviewed all documentation for this visit. The documentation on 01/07/21 for the exam, diagnosis, procedures, and orders  are all accurate and complete.

## 2021-01-07 NOTE — Assessment & Plan Note (Signed)
Stable on EKG.  May need pacemaker in the future.  Overall doing well without any high risk symptoms such as syncope.

## 2021-01-07 NOTE — Assessment & Plan Note (Signed)
Prior gastric bypass.  Overall doing well.  Maximal weight at 1 point was 361 pounds.

## 2021-01-07 NOTE — Assessment & Plan Note (Signed)
Atorvastatin 10 mg.  No myalgias.  LDL 65.  Excellent.

## 2021-01-07 NOTE — Assessment & Plan Note (Signed)
Stable

## 2021-01-19 DIAGNOSIS — R0981 Nasal congestion: Secondary | ICD-10-CM | POA: Diagnosis not present

## 2021-01-19 DIAGNOSIS — J029 Acute pharyngitis, unspecified: Secondary | ICD-10-CM | POA: Diagnosis not present

## 2021-01-19 DIAGNOSIS — R059 Cough, unspecified: Secondary | ICD-10-CM | POA: Diagnosis not present

## 2021-01-19 DIAGNOSIS — Z03818 Encounter for observation for suspected exposure to other biological agents ruled out: Secondary | ICD-10-CM | POA: Diagnosis not present

## 2021-01-26 ENCOUNTER — Ambulatory Visit (HOSPITAL_COMMUNITY): Payer: Medicare Other | Attending: Cardiology

## 2021-01-26 ENCOUNTER — Other Ambulatory Visit: Payer: Self-pay

## 2021-01-26 DIAGNOSIS — I350 Nonrheumatic aortic (valve) stenosis: Secondary | ICD-10-CM

## 2021-01-26 LAB — ECHOCARDIOGRAM COMPLETE
AR max vel: 5.68 cm2
AV Area VTI: 5.7 cm2
AV Area mean vel: 5.47 cm2
AV Mean grad: 12.6 mmHg
AV Peak grad: 23.6 mmHg
Ao pk vel: 2.43 m/s
Area-P 1/2: 1.87 cm2
P 1/2 time: 450 msec
S' Lateral: 3.15 cm

## 2021-01-28 ENCOUNTER — Telehealth: Payer: Self-pay | Admitting: *Deleted

## 2021-01-28 DIAGNOSIS — I77810 Thoracic aortic ectasia: Secondary | ICD-10-CM

## 2021-01-28 DIAGNOSIS — Z01812 Encounter for preprocedural laboratory examination: Secondary | ICD-10-CM

## 2021-01-28 NOTE — Telephone Encounter (Signed)
Ascending aorta is 48 mm. Mild aortic stenosis.  Check CTA of chest to evaluate in one year to monitor aorta.  Maintain good BP control.  Candee Furbish, MD  Written by Jerline Pain, MD on 01/27/2021  7:07 AM EST Seen by patient Everlene Other on 01/27/2021 12:26 PM  CTA of chest will be due 01/2022.

## 2021-01-31 DIAGNOSIS — Z89511 Acquired absence of right leg below knee: Secondary | ICD-10-CM | POA: Diagnosis not present

## 2021-02-02 DIAGNOSIS — Z7984 Long term (current) use of oral hypoglycemic drugs: Secondary | ICD-10-CM | POA: Diagnosis not present

## 2021-02-02 DIAGNOSIS — H04123 Dry eye syndrome of bilateral lacrimal glands: Secondary | ICD-10-CM | POA: Diagnosis not present

## 2021-02-02 DIAGNOSIS — Z9841 Cataract extraction status, right eye: Secondary | ICD-10-CM | POA: Diagnosis not present

## 2021-02-02 DIAGNOSIS — H0100B Unspecified blepharitis left eye, upper and lower eyelids: Secondary | ICD-10-CM | POA: Diagnosis not present

## 2021-02-02 DIAGNOSIS — H01006 Unspecified blepharitis left eye, unspecified eyelid: Secondary | ICD-10-CM | POA: Diagnosis not present

## 2021-02-02 DIAGNOSIS — H01003 Unspecified blepharitis right eye, unspecified eyelid: Secondary | ICD-10-CM | POA: Diagnosis not present

## 2021-02-02 DIAGNOSIS — E119 Type 2 diabetes mellitus without complications: Secondary | ICD-10-CM | POA: Diagnosis not present

## 2021-02-02 DIAGNOSIS — H0100A Unspecified blepharitis right eye, upper and lower eyelids: Secondary | ICD-10-CM | POA: Diagnosis not present

## 2021-02-02 DIAGNOSIS — Z961 Presence of intraocular lens: Secondary | ICD-10-CM | POA: Diagnosis not present

## 2021-02-02 DIAGNOSIS — Z9842 Cataract extraction status, left eye: Secondary | ICD-10-CM | POA: Diagnosis not present

## 2021-02-02 DIAGNOSIS — Z9889 Other specified postprocedural states: Secondary | ICD-10-CM | POA: Diagnosis not present

## 2021-02-16 DIAGNOSIS — L918 Other hypertrophic disorders of the skin: Secondary | ICD-10-CM | POA: Diagnosis not present

## 2021-02-22 DIAGNOSIS — E1149 Type 2 diabetes mellitus with other diabetic neurological complication: Secondary | ICD-10-CM | POA: Diagnosis not present

## 2021-02-22 DIAGNOSIS — E039 Hypothyroidism, unspecified: Secondary | ICD-10-CM | POA: Diagnosis not present

## 2021-02-22 DIAGNOSIS — I2510 Atherosclerotic heart disease of native coronary artery without angina pectoris: Secondary | ICD-10-CM | POA: Diagnosis not present

## 2021-02-22 DIAGNOSIS — E7800 Pure hypercholesterolemia, unspecified: Secondary | ICD-10-CM | POA: Diagnosis not present

## 2021-02-22 DIAGNOSIS — K219 Gastro-esophageal reflux disease without esophagitis: Secondary | ICD-10-CM | POA: Diagnosis not present

## 2021-02-22 DIAGNOSIS — I10 Essential (primary) hypertension: Secondary | ICD-10-CM | POA: Diagnosis not present

## 2021-02-22 DIAGNOSIS — Z8639 Personal history of other endocrine, nutritional and metabolic disease: Secondary | ICD-10-CM | POA: Diagnosis not present

## 2021-02-22 DIAGNOSIS — Z23 Encounter for immunization: Secondary | ICD-10-CM | POA: Diagnosis not present

## 2021-02-22 DIAGNOSIS — Z8546 Personal history of malignant neoplasm of prostate: Secondary | ICD-10-CM | POA: Diagnosis not present

## 2021-02-22 DIAGNOSIS — I350 Nonrheumatic aortic (valve) stenosis: Secondary | ICD-10-CM | POA: Diagnosis not present

## 2021-02-22 DIAGNOSIS — Z0000 Encounter for general adult medical examination without abnormal findings: Secondary | ICD-10-CM | POA: Diagnosis not present

## 2021-02-22 DIAGNOSIS — Z89432 Acquired absence of left foot: Secondary | ICD-10-CM | POA: Diagnosis not present

## 2021-02-22 DIAGNOSIS — F5101 Primary insomnia: Secondary | ICD-10-CM | POA: Diagnosis not present

## 2021-02-22 DIAGNOSIS — Z1389 Encounter for screening for other disorder: Secondary | ICD-10-CM | POA: Diagnosis not present

## 2021-02-22 DIAGNOSIS — N400 Enlarged prostate without lower urinary tract symptoms: Secondary | ICD-10-CM | POA: Diagnosis not present

## 2021-03-14 DIAGNOSIS — Z20822 Contact with and (suspected) exposure to covid-19: Secondary | ICD-10-CM | POA: Diagnosis not present

## 2021-03-30 ENCOUNTER — Ambulatory Visit (INDEPENDENT_AMBULATORY_CARE_PROVIDER_SITE_OTHER): Payer: Medicare Other

## 2021-03-30 ENCOUNTER — Encounter: Payer: Self-pay | Admitting: Orthopaedic Surgery

## 2021-03-30 ENCOUNTER — Other Ambulatory Visit: Payer: Self-pay

## 2021-03-30 ENCOUNTER — Ambulatory Visit (INDEPENDENT_AMBULATORY_CARE_PROVIDER_SITE_OTHER): Payer: Medicare Other | Admitting: Orthopaedic Surgery

## 2021-03-30 DIAGNOSIS — Z96641 Presence of right artificial hip joint: Secondary | ICD-10-CM | POA: Diagnosis not present

## 2021-03-30 NOTE — Progress Notes (Signed)
? ?Post-Op Visit Note ?  ?Patient: [name]           ?Date of Birth: 04-29-1950           ?MRN: [id number] ?Visit Date: 03/30/2021 ?PCP: [name], MD ? ? ?Assessment & Plan: ? ?Chief Complaint:  ?Chief Complaint  ?Patient presents with  ? Right Hip - Pain  ? ?Visit Diagnoses:  ?1. History of total hip replacement, right   ? ? ?Plan: Patient is a pleasant 71 year old gentleman who comes in today 1 year status post right Hemi hip arthroplasty from a femoral neck fracture 03/29/2020.  He has been doing well.  No complaints.  Examination of the right hip reveals full hip flexion.  Painless logroll.  At this point, we will continue to advance with activity as tolerated.  He will follow-up with [place] as needed.  Call with concerns or questions. ? ?Follow-Up Instructions: Return if symptoms worsen or fail to improve.  ? ?Orders:  ?Orders Placed This Encounter  ?Procedures  ? XR Pelvis 1-2 Views  ? ?No orders of the defined types were placed in this encounter. ? ? ?Imaging: ?XR Pelvis 1-2 Views ? ?Result Date: 03/30/2021 ?Well-seated prosthesis without complication  ? ?PMFS History: ?Patient Active Problem List  ? Diagnosis Date Noted  ? Hypotension 01/07/2021  ? S/P arthroscopy of right shoulder 06/02/2020  ? Closed fracture of neck of right femur ([place])   ? Hip fracture ([place]) 03/27/2020  ? Nontraumatic complete tear of right rotator cuff 02/26/2020  ? S/P transmetatarsal amputation of foot, left ([place]) 04/25/2016  ? Chronic osteomyelitis of toe, left ([place])   ? Chronic diastolic CHF (congestive heart failure) ([place]) 09/25/2015  ? Aortic stenosis 08/04/2014  ? Diabetic foot infection ([place]) 06/03/2014  ? Peripheral vascular disease ([place]) 06/03/2014  ? Coronary artery disease involving native coronary artery of native heart without angina pectoris 07/29/2013  ? Trifascicular block 07/29/2013  ? 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 07/26/2011  ? Hypothyroidism 01/22/2007  ? Type 2 diabetes mellitus with vascular disease ([place])  01/22/2007  ? HYPERLIPIDEMIA, MIXED 01/22/2007  ? MYOCARDIAL INFARCTION, HX OF 01/22/2007  ? ANEMIA, IRON DEFICIENCY, HX OF 01/22/2007  ? ?Past Medical History:  ?Diagnosis Date  ? Anemia   ? low iron  ? Arthritis   ? Coronary artery disease 2006  ? a. remote stenting to ramus, prox LAD x2.  ? Dental crowns present   ? Diabetes mellitus   ? First degree AV block   ? GERD (gastroesophageal reflux disease)   ? Heart attack ([place]) 06/2004  ? Heart murmur   ? aortic  ? Hx MRSA infection 02/2006  ? Hyperlipidemia   ? Hypertension   ? hx. of - has not been on med. since losing wt. after gastric bypass  ? Hypothyroidism   ? Morbid obesity ([place])   ? Mucoid cyst of joint 09/2011  ? left thumb  ? Sleep apnea   ? sleep study 03/29/2011; no CPAP use, lost >130lbs  ? Trifascicular block   ?  ?Family History  ?Problem Relation Age of Onset  ? Heart disease Mother   ?  ?Past Surgical History:  ?Procedure Laterality Date  ? AMPUTATION Left 04/19/2016  ? Procedure: Left Foot 4th Toe Amputation vs. Transmetatarsal;  Surgeon: [name], MD;  Location: [place];  Service: Orthopedics;  Laterality: Left;  ? BIOPSY  04/07/2019  ? Procedure: BIOPSY;  Surgeon: Otis Brace, MD;  [facility];  Service: Gastroenterology;;  ? CATARACT EXTRACTION  02/2010; 03/2010  ? COLONOSCOPY WITH PROPOFOL N/A 09/14/2014  ? Procedure: COLONOSCOPY WITH PROPOFOL;  Surgeon: Garlan Fair, MD;  [facility];  Service: Endoscopy;  Laterality: N/A;  ? COLONOSCOPY WITH PROPOFOL N/A 04/07/2019  ? Procedure: COLONOSCOPY WITH PROPOFOL;  Surgeon: Otis Brace, MD;  [facility];  Service: Gastroenterology;  Laterality: N/A;  ? CORONARY ANGIOPLASTY WITH STENT PLACEMENT  07/12/2004; 08/03/2004  ? total of 3 stents  ? I & D EXTREMITY Right 06/04/2014  ? Procedure: IRRIGATION AND DEBRIDEMENT EXTREMITY;  Surgeon: [name], MD;  Location: [place];  Service: Orthopedics;  Laterality: Right;  ? MASS EXCISION  10/04/2011  ? Procedure:  EXCISION MASS;  Surgeon: Wynonia Sours, MD;  Location: [place];  Service: Orthopedics;  Laterality: Left;  excision cyst debridment ip joint of left thumb  ? PROSTATECTOMY    ? right below knee amputation  2018  ? ROUX-EN-Y GASTRIC BYPASS  10/10/2010  ? laparoscopic  ? SHOULDER ARTHROSCOPY WITH ROTATOR CUFF REPAIR AND SUBACROMIAL DECOMPRESSION Right 03/23/2020  ? Procedure: RIGHT SHOULDER ARTHROSCOPY WITH DEBRIDEMENT AND  ROTATOR CUFF REPAIR;  Surgeon: [name], MD;  Location: [place];  Service: Orthopedics;  Laterality: Right;  ? TOE AMPUTATION  02/23/2006  ? left foot second ray amputation  ? TOTAL HIP ARTHROPLASTY Right 03/29/2020  ? Procedure: TOTAL HIP ARTHROPLASTY POSTERIOR APPROACH;  Surgeon: [name], MD;  Location: [place];  Service: Orthopedics;  Laterality: Right;  ? TRANSMETATARSAL AMPUTATION Left   ? TRIGGER FINGER RELEASE Right 09/16/2019  ? Procedure: RELEASE TRIGGER FINGER/A-1 PULLEY;  Surgeon: Daryll Brod, MD;  Location: [place];  Service: Orthopedics;  Laterality: Right;  IV REGIONAL FOREARM BLOCK  ? ?Social History  ? ?Occupational History  ? Occupation: Engineer, maintenance (IT) taxes  ?Tobacco Use  ? Smoking status: Never  ? Smokeless tobacco: Never  ?Substance and Sexual Activity  ? Alcohol use: No  ? Drug use: No  ? Sexual activity: Yes  ? ? ? ?

## 2021-04-15 DIAGNOSIS — Z20822 Contact with and (suspected) exposure to covid-19: Secondary | ICD-10-CM | POA: Diagnosis not present

## 2021-04-25 DIAGNOSIS — B354 Tinea corporis: Secondary | ICD-10-CM | POA: Diagnosis not present

## 2021-05-11 DIAGNOSIS — Z20822 Contact with and (suspected) exposure to covid-19: Secondary | ICD-10-CM | POA: Diagnosis not present

## 2021-05-17 DIAGNOSIS — Z20822 Contact with and (suspected) exposure to covid-19: Secondary | ICD-10-CM | POA: Diagnosis not present

## 2021-05-24 DIAGNOSIS — Z20822 Contact with and (suspected) exposure to covid-19: Secondary | ICD-10-CM | POA: Diagnosis not present

## 2021-05-26 DIAGNOSIS — Z20822 Contact with and (suspected) exposure to covid-19: Secondary | ICD-10-CM | POA: Diagnosis not present

## 2021-06-04 ENCOUNTER — Other Ambulatory Visit: Payer: Self-pay | Admitting: Cardiology

## 2021-06-07 ENCOUNTER — Ambulatory Visit (INDEPENDENT_AMBULATORY_CARE_PROVIDER_SITE_OTHER): Payer: Medicare Other | Admitting: Family

## 2021-06-07 ENCOUNTER — Encounter: Payer: Self-pay | Admitting: Family

## 2021-06-07 DIAGNOSIS — Z89511 Acquired absence of right leg below knee: Secondary | ICD-10-CM

## 2021-06-07 DIAGNOSIS — S88111A Complete traumatic amputation at level between knee and ankle, right lower leg, initial encounter: Secondary | ICD-10-CM

## 2021-06-07 NOTE — Progress Notes (Signed)
? ?Office Visit Note ?  ?Patient: [name]           ?Date of Birth: 13-Aug-1950           ?MRN: [id number] ?Visit Date: 06/07/2021 ?             ?Requested by: [name], MD ?[address] ?[address] ?[place],  [place] [address] ?PCP: [name], MD ? ?Chief Complaint  ?Patient presents with  ? Right Leg - Follow-up  ?  Hx right BKA  ? ? ? ? ?HPI: ?Patient is a 71 year old gentleman seen today for prosthetic evaluation he is currently a K3 level ambulator he has had his current foot and ankle for his prosthetic for about 5 years he complains of difficulty descending stairs walking on inclines as well as stooping.  He feels unsafe he fears falling. ? ?He is 6 foot 8 inches and a microprocessor foot would allow him to stand more safely with the foot flexing beneath him to stand as well as provide him with ease walking on inclines as well as stooping ? ?Patient is an existing right transtibial  amputee. ? ?Patient's current comorbidities are not expected to impact the ability to function with the prescribed prosthesis. ?Patient verbally communicates a strong desire to use a prosthesis. ?Patient currently requires mobility aids to ambulate without a prosthesis.  Expects not to use mobility aids with a new prosthesis. ? ?Patient is a K3 level ambulator that spends a lot of time walking around on uneven terrain over obstacles, up and down stairs, and ambulates with a variable cadence. ? ?The patient will benefit from an MPK knee because they frequently encounter uneven terrain, stairs, and sometimes have to walk backwards. The "stumble recovery" and "intuitive stance" features will reduce fall risk and allow the patient to walk down stairs "step over step." ? ? ?Assessment & Plan: ?Visit Diagnoses:  ?1. Below-knee amputation of right lower extremity ([place])   ? ? ?Plan: Order provided for new foot and ankle with microprocessor.  Feel that this will serve the patient well he voices desire to have this technology.   Has done quite well since his below-knee amputation on the right. ?Follow-Up Instructions: Return if symptoms worsen or fail to improve.  ? ?Ortho Exam ? ?Patient is alert, oriented, no adenopathy, well-dressed, normal affect, normal respiratory effort. ?The right residual limb is well consolidated well-healed there is no callus no impending breakdown ? ?Imaging: ?No results found. ?No images are attached to the encounter. ? ?Labs: ?Lab Results  ?Component Value Date  ? HGBA1C 6.8 (H) 03/28/2020  ? HGBA1C 6.5 (H) 09/26/2015  ? HGBA1C 7.0 (H) 06/04/2014  ? REPTSTATUS 08/01/2016 FINAL 07/30/2016  ? GRAMSTAIN  06/04/2014  ?  MODERATE WBC PRESENT, PREDOMINANTLY PMN ?NO SQUAMOUS EPITHELIAL CELLS SEEN ?MODERATE GRAM POSITIVE COCCI IN CLUSTERS ?Performed at [facility] ?  ? GRAMSTAIN  06/04/2014  ?  MODERATE WBC PRESENT, PREDOMINANTLY PMN ?NO SQUAMOUS EPITHELIAL CELLS SEEN ?MODERATE GRAM POSITIVE COCCI IN PAIRS ?Performed at [facility] ?  ? CULT  07/30/2016  ?  NO GROWTH ?Performed at [facility], [place] [address] [place], [place] [address] ?  ? ? ? ?Lab Results  ?Component Value Date  ? ALBUMIN 3.5 03/28/2020  ? ALBUMIN 3.5 03/28/2020  ? ALBUMIN 3.7 03/27/2020  ? ? ?Lab Results  ?Component Value Date  ? MG 2.0 04/02/2020  ? ?Lab Results  ?Component Value Date  ? VD25OH 53.03 03/28/2020  ? ? ?  No results found for: PREALBUMIN ? ?  Latest Ref Rng & Units 04/01/2020  ?  3:31 AM 03/31/2020  ?  5:23 AM 03/30/2020  ?  9:16 AM  ?CBC EXTENDED  ?WBC 4.0 - 10.5 K/uL 8.6   9.0   10.3    ?RBC 4.22 - 5.81 MIL/uL 3.92   3.98   4.17    ?Hemoglobin 13.0 - 17.0 g/dL 12.3   12.5   13.0    ?HCT 39.0 - 52.0 % 36.0   36.0   38.6    ?Platelets 150 - 400 K/uL 205   181   202    ? ? ? ?There is no height or weight on file to calculate BMI. ? ?Orders:  ?No orders of the defined types were placed in this encounter. ? ?No orders of the defined types were placed in this encounter. ? ? ? Procedures: ?No procedures  performed ? ?Clinical Data: ?No additional findings. ? ?ROS: ? ?All other systems negative, except as noted in the HPI. ?Review of Systems ? ?Objective: ?Vital Signs: There were no vitals taken for this visit. ? ?Specialty Comments:  ?No specialty comments available. ? ?PMFS History: ?Patient Active Problem List  ? Diagnosis Date Noted  ? Hypotension 01/07/2021  ? S/P arthroscopy of right shoulder 06/02/2020  ? Closed fracture of neck of right femur ([place])   ? Hip fracture ([place]) 03/27/2020  ? Nontraumatic complete tear of right rotator cuff 02/26/2020  ? S/P transmetatarsal amputation of foot, left ([place]) 04/25/2016  ? Chronic osteomyelitis of toe, left ([place])   ? Chronic diastolic CHF (congestive heart failure) ([place]) 09/25/2015  ? Aortic stenosis 08/04/2014  ? Diabetic foot infection ([place]) 06/03/2014  ? Peripheral vascular disease ([place]) 06/03/2014  ? Coronary artery disease involving native coronary artery of native heart without angina pectoris 07/29/2013  ? Trifascicular block 07/29/2013  ? 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 07/26/2011  ? Hypothyroidism 01/22/2007  ? Type 2 diabetes mellitus with vascular disease ([place]) 01/22/2007  ? HYPERLIPIDEMIA, MIXED 01/22/2007  ? MYOCARDIAL INFARCTION, HX OF 01/22/2007  ? ANEMIA, IRON DEFICIENCY, HX OF 01/22/2007  ? ?Past Medical History:  ?Diagnosis Date  ? Anemia   ? low iron  ? Arthritis   ? Coronary artery disease 2006  ? a. remote stenting to ramus, prox LAD x2.  ? Dental crowns present   ? Diabetes mellitus   ? First degree AV block   ? GERD (gastroesophageal reflux disease)   ? Heart attack ([place]) 06/2004  ? Heart murmur   ? aortic  ? Hx MRSA infection 02/2006  ? Hyperlipidemia   ? Hypertension   ? hx. of - has not been on med. since losing wt. after gastric bypass  ? Hypothyroidism   ? Morbid obesity ([place])   ? Mucoid cyst of joint 09/2011  ? left thumb  ? Sleep apnea   ? sleep study 03/29/2011; no CPAP use, lost >130lbs  ? Trifascicular block   ?  ?Family History   ?Problem Relation Age of Onset  ? Heart disease Mother   ?  ?Past Surgical History:  ?Procedure Laterality Date  ? AMPUTATION Left 04/19/2016  ? Procedure: Left Foot 4th Toe Amputation vs. Transmetatarsal;  Surgeon: [name], MD;  Location: [facility];  Service: Orthopedics;  Laterality: Left;  ? BIOPSY  04/07/2019  ? Procedure: BIOPSY;  Surgeon: Otis Brace, MD;  [facility];  Service: Gastroenterology;;  ? CATARACT EXTRACTION  02/2010; 03/2010  ?  COLONOSCOPY WITH PROPOFOL N/A 09/14/2014  ? Procedure: COLONOSCOPY WITH PROPOFOL;  Surgeon: Garlan Fair, MD;  [facility];  Service: Endoscopy;  Laterality: N/A;  ? COLONOSCOPY WITH PROPOFOL N/A 04/07/2019  ? Procedure: COLONOSCOPY WITH PROPOFOL;  Surgeon: Otis Brace, MD;  [facility];  Service: Gastroenterology;  Laterality: N/A;  ? CORONARY ANGIOPLASTY WITH STENT PLACEMENT  07/12/2004; 08/03/2004  ? total of 3 stents  ? I & D EXTREMITY Right 06/04/2014  ? Procedure: IRRIGATION AND DEBRIDEMENT EXTREMITY;  Surgeon: [name], MD;  Location: [facility];  Service: Orthopedics;  Laterality: Right;  ? MASS EXCISION  10/04/2011  ? Procedure: EXCISION MASS;  Surgeon: Wynonia Sours, MD;  Location: [facility];  Service: Orthopedics;  Laterality: Left;  excision cyst debridment ip joint of left thumb  ? PROSTATECTOMY    ? right below knee amputation  2018  ? ROUX-EN-Y GASTRIC BYPASS  10/10/2010  ? laparoscopic  ? SHOULDER ARTHROSCOPY WITH ROTATOR CUFF REPAIR AND SUBACROMIAL DECOMPRESSION Right 03/23/2020  ? Procedure: RIGHT SHOULDER ARTHROSCOPY WITH DEBRIDEMENT AND  ROTATOR CUFF REPAIR;  Surgeon: [name], MD;  Location: [facility];  Service: Orthopedics;  Laterality: Right;  ? TOE AMPUTATION  02/23/2006  ? left foot second ray amputation  ? TOTAL HIP ARTHROPLASTY Right 03/29/2020  ? Procedure: TOTAL HIP ARTHROPLASTY POSTERIOR APPROACH;  Surgeon: [name], MD;  Location: [facility];  Service: Orthopedics;   Laterality: Right;  ? TRANSMETATARSAL AMPUTATION Left   ? TRIGGER FINGER RELEASE Right 09/16/2019  ? Procedure: RELEASE TRIGGER FINGER/A-1 PULLEY;  Surgeon: Daryll Brod, MD;  Location: [facility];  Service: Orthopedics;

## 2021-07-25 ENCOUNTER — Ambulatory Visit (INDEPENDENT_AMBULATORY_CARE_PROVIDER_SITE_OTHER): Payer: Medicare Other | Admitting: Orthopedic Surgery

## 2021-07-25 DIAGNOSIS — Z89432 Acquired absence of left foot: Secondary | ICD-10-CM

## 2021-07-25 DIAGNOSIS — S88111A Complete traumatic amputation at level between knee and ankle, right lower leg, initial encounter: Secondary | ICD-10-CM | POA: Diagnosis not present

## 2021-07-25 DIAGNOSIS — L97521 Non-pressure chronic ulcer of other part of left foot limited to breakdown of skin: Secondary | ICD-10-CM

## 2021-07-27 ENCOUNTER — Encounter: Payer: Self-pay | Admitting: Orthopedic Surgery

## 2021-07-27 NOTE — Progress Notes (Signed)
Office Visit Note   Patient: [name]           Date of Birth: 04-07-1950           MRN: [id number] Visit Date: 07/25/2021              Requested by: [name], MD [facility] [employer] [facility] [employer] [address],  [place] [address] PCP: [name], MD  Chief Complaint  Patient presents with   Left Foot - Wound Check    Hx transmet amputation      HPI: Patient is a 71 year old gentleman who is seen for ulceration left transmetatarsal head.  Patient is status post a right transtibial amputation in 2018 patient states he has acute ulceration and bleeding from the ulcer on the left foot.  Assessment & Plan: Visit Diagnoses:  1. Below-knee amputation of right lower extremity ([place])   2. History of transmetatarsal amputation of left foot ([place])   3. Non-pressure chronic ulcer of other part of left foot limited to breakdown of skin ([place])     Plan: Ulcer was debrided his spacer in the shoe was cut back to 1 Pressure from the first metatarsal.  Follow-Up Instructions: Return in about 4 weeks (around 08/22/2021).   Ortho Exam  Patient is alert, oriented, no adenopathy, well-dressed, normal affect, normal respiratory effort. Examination patient has a Wagner grade 1 ulcer beneath the medial column left transmetatarsal amputation.  There is no cellulitis odor or drainage.  After informed consent a 10 blade knife was used to debride the skin and soft tissue back to healthy viable granulation tissue.  The ulcer was 10 mm in diameter prior to debridement 2 cm in diameter after debridement and 1 mm deep with healthy granulation tissue no exposed bone or tendon.  Imaging: No results found. No images are attached to the encounter.  Labs: Lab Results  Component Value Date   HGBA1C 6.8 (H) 03/28/2020   HGBA1C 6.5 (H) 09/26/2015   HGBA1C 7.0 (H) 06/04/2014   REPTSTATUS 08/01/2016 FINAL 07/30/2016   GRAMSTAIN  06/04/2014    MODERATE WBC PRESENT, PREDOMINANTLY PMN NO SQUAMOUS EPITHELIAL CELLS  SEEN MODERATE GRAM POSITIVE COCCI IN CLUSTERS Performed at [facility]  06/04/2014    MODERATE WBC PRESENT, PREDOMINANTLY PMN NO SQUAMOUS EPITHELIAL CELLS SEEN MODERATE GRAM POSITIVE COCCI IN PAIRS Performed at [facility]  07/30/2016    NO GROWTH Performed at [facility] [address], [place],  [address]      Lab Results  Component Value Date   ALBUMIN 3.5 03/28/2020   ALBUMIN 3.5 03/28/2020   ALBUMIN 3.7 03/27/2020    Lab Results  Component Value Date   MG 2.0 04/02/2020   Lab Results  Component Value Date   VD25OH 53.03 03/28/2020    No results found for: "PREALBUMIN"    Latest Ref Rng & Units 04/01/2020    3:31 AM 03/31/2020    5:23 AM 03/30/2020    9:16 AM  CBC EXTENDED  WBC 4.0 - 10.5 K/uL 8.6  9.0  10.3   RBC 4.22 - 5.81 MIL/uL 3.92  3.98  4.17   Hemoglobin 13.0 - 17.0 g/dL 12.3  12.5  13.0   HCT 39.0 - 52.0 % 36.0  36.0  38.6   Platelets 150 - 400 K/uL 205  181  202      There is no height or weight on file to calculate BMI.  Orders:  No orders of the defined types were placed in this encounter.  No orders of the defined types were placed in this encounter.    Procedures: No procedures performed  Clinical Data: No additional findings.  ROS:  All other systems negative, except as noted in the HPI. Review of Systems  Objective: Vital Signs: There were no vitals taken for this visit.  Specialty Comments:  No specialty comments available.  PMFS History: Patient Active Problem List   Diagnosis Date Noted   Hypotension 01/07/2021   S/P arthroscopy of right shoulder 06/02/2020   Closed fracture of neck of right femur ([place])    Hip fracture ([place]) 03/27/2020   Nontraumatic complete tear of right rotator cuff 02/26/2020   S/P transmetatarsal amputation of foot, left ([place]) 04/25/2016   Chronic osteomyelitis of toe, left ([facility])    Chronic diastolic CHF (congestive heart failure) ([place])  09/25/2015   Aortic stenosis 08/04/2014   Diabetic foot infection ([place]) 06/03/2014   Peripheral vascular disease ([place]) 06/03/2014   Coronary artery disease involving native coronary artery of native heart without angina pectoris 07/29/2013   Trifascicular block 07/29/2013   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 07/26/2011   Hypothyroidism 01/22/2007   Type 2 diabetes mellitus with vascular disease ([place]) 01/22/2007   HYPERLIPIDEMIA, MIXED 01/22/2007   MYOCARDIAL INFARCTION, HX OF 01/22/2007   ANEMIA, IRON DEFICIENCY, HX OF 01/22/2007   Past Medical History:  Diagnosis Date   Anemia    low iron   Arthritis    Coronary artery disease 2006   a. remote stenting to ramus, prox LAD x2.   Dental crowns present    Diabetes mellitus    First degree AV block    GERD (gastroesophageal reflux disease)    Heart attack ([place]) 06/2004   Heart murmur    aortic   Hx MRSA infection 02/2006   Hyperlipidemia    Hypertension    hx. of - has not been on med. since losing wt. after gastric bypass   Hypothyroidism    Morbid obesity ([place])    Mucoid cyst of joint 09/2011   left thumb   Sleep apnea    sleep study 03/29/2011; no CPAP use, lost >130lbs   Trifascicular block     Family History  Problem Relation Age of Onset   Heart disease Mother     Past Surgical History:  Procedure Laterality Date   AMPUTATION Left 04/19/2016   Procedure: Left Foot 4th Toe Amputation vs. Transmetatarsal;  Surgeon: [name], MD;  Location: [facility];  Service: Orthopedics;  Laterality: Left;   BIOPSY  04/07/2019   Procedure: BIOPSY;  Surgeon: Otis Brace, MD;  [facility];  Service: Gastroenterology;;   CATARACT EXTRACTION  02/2010; 03/2010   COLONOSCOPY WITH PROPOFOL N/A 09/14/2014   Procedure: COLONOSCOPY WITH PROPOFOL;  Surgeon: Garlan Fair, MD;  [facility];  Service: Endoscopy;  Laterality: N/A;   COLONOSCOPY WITH PROPOFOL N/A 04/07/2019   Procedure: COLONOSCOPY WITH PROPOFOL;   Surgeon: Otis Brace, MD;  [facility];  Service: Gastroenterology;  Laterality: N/A;   CORONARY ANGIOPLASTY WITH STENT PLACEMENT  07/12/2004; 08/03/2004   total of 3 stents   I & D EXTREMITY Right 06/04/2014   Procedure: IRRIGATION AND DEBRIDEMENT EXTREMITY;  Surgeon: [name], MD;  Location: [facility];  Service: Orthopedics;  Laterality: Right;   MASS EXCISION  10/04/2011   Procedure: EXCISION MASS;  Surgeon: Wynonia Sours, MD;  [facility];  Service: Orthopedics;  Laterality: Left;  excision cyst debridment ip joint of left thumb   PROSTATECTOMY     right below knee amputation  2018   ROUX-EN-Y GASTRIC BYPASS  10/10/2010   laparoscopic   SHOULDER ARTHROSCOPY WITH ROTATOR CUFF REPAIR AND SUBACROMIAL DECOMPRESSION Right 03/23/2020   Procedure: RIGHT SHOULDER ARTHROSCOPY WITH DEBRIDEMENT AND  ROTATOR CUFF REPAIR;  Surgeon: [name], MD;  Location: [facility];  Service: Orthopedics;  Laterality: Right;   TOE AMPUTATION  02/23/2006   left foot second ray amputation   TOTAL HIP ARTHROPLASTY Right 03/29/2020   Procedure: TOTAL HIP ARTHROPLASTY POSTERIOR APPROACH;  Surgeon: [name], MD;  Location: [facility];  Service: Orthopedics;  Laterality: Right;   TRANSMETATARSAL AMPUTATION Left    TRIGGER FINGER RELEASE Right 09/16/2019   Procedure: RELEASE TRIGGER FINGER/A-1 PULLEY;  Surgeon: Daryll Brod, MD;  Location: [facility];  Service: Orthopedics;  Laterality: Right;  IV REGIONAL FOREARM BLOCK   Social History   Occupational History   Occupation: Engineer, maintenance (IT) taxes  Tobacco Use   Smoking status: Never   Smokeless tobacco: Never  Substance and Sexual Activity   Alcohol use: No   Drug use: No   Sexual activity: Yes

## 2021-07-28 ENCOUNTER — Encounter: Payer: Self-pay | Admitting: Orthopedic Surgery

## 2021-07-28 ENCOUNTER — Ambulatory Visit (INDEPENDENT_AMBULATORY_CARE_PROVIDER_SITE_OTHER): Payer: Medicare Other | Admitting: Orthopedic Surgery

## 2021-07-28 DIAGNOSIS — L97521 Non-pressure chronic ulcer of other part of left foot limited to breakdown of skin: Secondary | ICD-10-CM | POA: Diagnosis not present

## 2021-07-28 DIAGNOSIS — Z89432 Acquired absence of left foot: Secondary | ICD-10-CM | POA: Diagnosis not present

## 2021-07-28 DIAGNOSIS — S88111A Complete traumatic amputation at level between knee and ankle, right lower leg, initial encounter: Secondary | ICD-10-CM | POA: Diagnosis not present

## 2021-07-28 MED ORDER — DOXYCYCLINE HYCLATE 100 MG PO TABS: 100.0000 mg | ORAL_TABLET | Freq: Two times a day (BID) | ORAL | 0 refills | Status: DC

## 2021-07-28 NOTE — Progress Notes (Signed)
Office Visit Note   Patient: [name]           Date of Birth: [birth year]           MRN: [id number] Visit Date: 07/28/2021              Requested by: [name], MD [facility] [employer] [address],  [place] [address] PCP: [name], MD  Chief Complaint  Patient presents with   Left Foot - Follow-up      HPI: Patient is a 71 year old gentleman who presents complaining of bleeding from the ulcer and left transmetatarsal amputation.  Patient is status post surgery in 2018 patient also status post right below-knee amputation.  Assessment & Plan: Visit Diagnoses:  1. Below-knee amputation of right lower extremity ([place])   2. History of transmetatarsal amputation of left foot ([place])   3. Non-pressure chronic ulcer of other part of left foot limited to breakdown of skin ([place])     Plan: Ulcer was debrided patient is started on doxycycline.  Follow-Up Instructions: No follow-ups on file.   Ortho Exam  Patient is alert, oriented, no adenopathy, well-dressed, normal affect, normal respiratory effort. Examination patient has a palpable pulse he has a Wagner grade 1 ulcer over the residual transmetatarsal amputation.  After informed consent a 10 blade knife was used to debride the skin and soft tissue back to healthy viable tissue this measures 3 cm in diameter after debridement and is flat there is no tunneling no exposed bone or tendon no cellulitis.  Imaging: No results found. No images are attached to the encounter.  Labs: Lab Results  Component Value Date   HGBA1C 6.8 (H) 03/28/2020   HGBA1C 6.5 (H) 09/26/2015   HGBA1C 7.0 (H) 06/04/2014   REPTSTATUS 08/01/2016 FINAL 07/30/2016   GRAMSTAIN  06/04/2014    MODERATE WBC PRESENT, PREDOMINANTLY PMN NO SQUAMOUS EPITHELIAL CELLS SEEN MODERATE GRAM POSITIVE COCCI IN CLUSTERS Performed at [facility]  06/04/2014    MODERATE WBC PRESENT, PREDOMINANTLY PMN NO SQUAMOUS EPITHELIAL CELLS  SEEN MODERATE GRAM POSITIVE COCCI IN PAIRS Performed at [facility]  07/30/2016    NO GROWTH Performed at [facility] [address], [place], [place] [address]      Lab Results  Component Value Date   ALBUMIN 3.5 03/28/2020   ALBUMIN 3.5 03/28/2020   ALBUMIN 3.7 03/27/2020    Lab Results  Component Value Date   MG 2.0 04/02/2020   Lab Results  Component Value Date   VD25OH 53.03 03/28/2020    No results found for: "PREALBUMIN"    Latest Ref Rng & Units 04/01/2020    3:31 AM 03/31/2020    5:23 AM 03/30/2020    9:16 AM  CBC EXTENDED  WBC 4.0 - 10.5 K/uL 8.6  9.0  10.3   RBC 4.22 - 5.81 MIL/uL 3.92  3.98  4.17   Hemoglobin 13.0 - 17.0 g/dL 12.3  12.5  13.0   HCT 39.0 - 52.0 % 36.0  36.0  38.6   Platelets 150 - 400 K/uL 205  181  202      There is no height or weight on file to calculate BMI.  Orders:  No orders of the defined types were placed in this encounter.  No orders of the defined types were placed in this encounter.    Procedures: No procedures performed  Clinical Data: No additional findings.  ROS:  All other systems negative, except as noted in the HPI. Review of Systems  Objective: Vital Signs: There were no vitals taken for this visit.  Specialty Comments:  No specialty comments available.  PMFS History: Patient Active Problem List   Diagnosis Date Noted   Hypotension 01/07/2021   S/P arthroscopy of right shoulder 06/02/2020   Closed fracture of neck of right femur ([place])    Hip fracture ([place]) 03/27/2020   Nontraumatic complete tear of right rotator cuff 02/26/2020   S/P transmetatarsal amputation of foot, left ([place]) 04/25/2016   Chronic osteomyelitis of toe, left ([facility])    Chronic diastolic CHF (congestive heart failure) ([place]) 09/25/2015   Aortic stenosis 08/04/2014   Diabetic foot infection ([place]) 06/03/2014   Peripheral vascular disease ([place]) 06/03/2014   Coronary artery disease involving native coronary  artery of native heart without angina pectoris 07/29/2013   Trifascicular block 07/29/2013   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 07/26/2011   Hypothyroidism 01/22/2007   Type 2 diabetes mellitus with vascular disease ([place]) 01/22/2007   HYPERLIPIDEMIA, MIXED 01/22/2007   MYOCARDIAL INFARCTION, HX OF 01/22/2007   ANEMIA, IRON DEFICIENCY, HX OF 01/22/2007   Past Medical History:  Diagnosis Date   Anemia    low iron   Arthritis    Coronary artery disease 2006   a. remote stenting to ramus, prox LAD x2.   Dental crowns present    Diabetes mellitus    First degree AV block    GERD (gastroesophageal reflux disease)    Heart attack ([place]) 06/2004   Heart murmur    aortic   Hx MRSA infection 02/2006   Hyperlipidemia    Hypertension    hx. of - has not been on med. since losing wt. after gastric bypass   Hypothyroidism    Morbid obesity ([place])    Mucoid cyst of joint 09/2011   left thumb   Sleep apnea    sleep study 03/29/2011; no CPAP use, lost >130lbs   Trifascicular block     Family History  Problem Relation Age of Onset   Heart disease Mother     Past Surgical History:  Procedure Laterality Date   AMPUTATION Left 04/19/2016   Procedure: Left Foot 4th Toe Amputation vs. Transmetatarsal;  Surgeon: [name], MD;  Location: [facility];  Service: Orthopedics;  Laterality: Left;   BIOPSY  04/07/2019   Procedure: BIOPSY;  Surgeon: Otis Brace, MD;  [facility];  Service: Gastroenterology;;   CATARACT EXTRACTION  02/2010; 03/2010   COLONOSCOPY WITH PROPOFOL N/A 09/14/2014   Procedure: COLONOSCOPY WITH PROPOFOL;  Surgeon: Garlan Fair, MD;  [facility];  Service: Endoscopy;  Laterality: N/A;   COLONOSCOPY WITH PROPOFOL N/A 04/07/2019   Procedure: COLONOSCOPY WITH PROPOFOL;  Surgeon: Otis Brace, MD;  [facility];  Service: Gastroenterology;  Laterality: N/A;   CORONARY ANGIOPLASTY WITH STENT PLACEMENT  07/12/2004; 08/03/2004   total of 3 stents    I & D EXTREMITY Right 06/04/2014   Procedure: IRRIGATION AND DEBRIDEMENT EXTREMITY;  Surgeon: [name], MD;  Location: [facility];  Service: Orthopedics;  Laterality: Right;   MASS EXCISION  10/04/2011   Procedure: EXCISION MASS;  Surgeon: Wynonia Sours, MD;  Location: [facility];  Service: Orthopedics;  Laterality: Left;  excision cyst debridment ip joint of left thumb   PROSTATECTOMY     right below knee amputation  2018   ROUX-EN-Y GASTRIC BYPASS  10/10/2010   laparoscopic  SHOULDER ARTHROSCOPY WITH ROTATOR CUFF REPAIR AND SUBACROMIAL DECOMPRESSION Right 03/23/2020   Procedure: RIGHT SHOULDER ARTHROSCOPY WITH DEBRIDEMENT AND  ROTATOR CUFF REPAIR;  Surgeon: [name], MD;  Location: [facility];  Service: Orthopedics;  Laterality: Right;   TOE AMPUTATION  02/23/2006   left foot second ray amputation   TOTAL HIP ARTHROPLASTY Right 03/29/2020   Procedure: TOTAL HIP ARTHROPLASTY POSTERIOR APPROACH;  Surgeon: [name], MD;  Location: [facility];  Service: Orthopedics;  Laterality: Right;   TRANSMETATARSAL AMPUTATION Left    TRIGGER FINGER RELEASE Right 09/16/2019   Procedure: RELEASE TRIGGER FINGER/A-1 PULLEY;  Surgeon: Daryll Brod, MD;  Location: [facility];  Service: Orthopedics;  Laterality: Right;  IV REGIONAL FOREARM BLOCK   Social History   Occupational History   Occupation: Engineer, maintenance (IT) taxes  Tobacco Use   Smoking status: Never   Smokeless tobacco: Never  Substance and Sexual Activity   Alcohol use: No   Drug use: No   Sexual activity: Yes

## 2021-08-08 ENCOUNTER — Encounter (HOSPITAL_COMMUNITY): Payer: Self-pay | Admitting: Emergency Medicine

## 2021-08-08 ENCOUNTER — Telehealth: Payer: Self-pay | Admitting: Cardiology

## 2021-08-08 ENCOUNTER — Other Ambulatory Visit: Payer: Self-pay

## 2021-08-08 ENCOUNTER — Emergency Department (HOSPITAL_COMMUNITY)
Admission: EM | Admit: 2021-08-08 | Discharge: 2021-08-08 | Disposition: A | Discharge status: Home / Self Care | Payer: Medicare Other | Attending: Emergency Medicine | Admitting: Emergency Medicine

## 2021-08-08 DIAGNOSIS — E119 Type 2 diabetes mellitus without complications: Secondary | ICD-10-CM | POA: Diagnosis not present

## 2021-08-08 DIAGNOSIS — Z79899 Other long term (current) drug therapy: Secondary | ICD-10-CM | POA: Insufficient documentation

## 2021-08-08 DIAGNOSIS — Z955 Presence of coronary angioplasty implant and graft: Secondary | ICD-10-CM | POA: Diagnosis not present

## 2021-08-08 DIAGNOSIS — R011 Cardiac murmur, unspecified: Secondary | ICD-10-CM | POA: Insufficient documentation

## 2021-08-08 DIAGNOSIS — Z7982 Long term (current) use of aspirin: Secondary | ICD-10-CM | POA: Insufficient documentation

## 2021-08-08 DIAGNOSIS — E039 Hypothyroidism, unspecified: Secondary | ICD-10-CM | POA: Diagnosis not present

## 2021-08-08 DIAGNOSIS — R000 Tachycardia, unspecified: Secondary | ICD-10-CM | POA: Diagnosis not present

## 2021-08-08 DIAGNOSIS — Z7984 Long term (current) use of oral hypoglycemic drugs: Secondary | ICD-10-CM | POA: Diagnosis not present

## 2021-08-08 DIAGNOSIS — I2510 Atherosclerotic heart disease of native coronary artery without angina pectoris: Secondary | ICD-10-CM | POA: Insufficient documentation

## 2021-08-08 DIAGNOSIS — I10 Essential (primary) hypertension: Secondary | ICD-10-CM | POA: Insufficient documentation

## 2021-08-08 LAB — CBC WITH DIFFERENTIAL/PLATELET
Abs Immature Granulocytes: 0.01 10*3/uL (ref 0.00–0.07)
Basophils Absolute: 0.1 10*3/uL (ref 0.0–0.1)
Basophils Relative: 1 %
Eosinophils Absolute: 0.3 10*3/uL (ref 0.0–0.5)
Eosinophils Relative: 4 %
HCT: 47.2 % (ref 39.0–52.0)
Hemoglobin: 15.9 g/dL (ref 13.0–17.0)
Immature Granulocytes: 0 %
Lymphocytes Relative: 19 %
Lymphs Abs: 1.3 10*3/uL (ref 0.7–4.0)
MCH: 30.8 pg (ref 26.0–34.0)
MCHC: 33.7 g/dL (ref 30.0–36.0)
MCV: 91.3 fL (ref 80.0–100.0)
Monocytes Absolute: 0.7 10*3/uL (ref 0.1–1.0)
Monocytes Relative: 11 %
Neutro Abs: 4.4 10*3/uL (ref 1.7–7.7)
Neutrophils Relative %: 65 %
Platelets: 197 10*3/uL (ref 150–400)
RBC: 5.17 MIL/uL (ref 4.22–5.81)
RDW: 13.4 % (ref 11.5–15.5)
WBC: 6.8 10*3/uL (ref 4.0–10.5)
nRBC: 0 % (ref 0.0–0.2)

## 2021-08-08 LAB — COMPREHENSIVE METABOLIC PANEL
ALT: 22 U/L (ref 0–44)
AST: 21 U/L (ref 15–41)
Albumin: 3.9 g/dL (ref 3.5–5.0)
Alkaline Phosphatase: 65 U/L (ref 38–126)
Anion gap: 12 (ref 5–15)
BUN: 11 mg/dL (ref 8–23)
CO2: 22 mmol/L (ref 22–32)
Calcium: 8.9 mg/dL (ref 8.9–10.3)
Chloride: 106 mmol/L (ref 98–111)
Creatinine, Ser: 0.91 mg/dL (ref 0.61–1.24)
GFR, Estimated: >60 mL/min (ref >60)
Glucose, Bld: 160 mg/dL — ABNORMAL HIGH (ref 70–99)
Potassium: 3.6 mmol/L (ref 3.5–5.1)
Sodium: 140 mmol/L (ref 135–145)
Total Bilirubin: 0.8 mg/dL (ref 0.3–1.2)
Total Protein: 7.1 g/dL (ref 6.5–8.1)

## 2021-08-08 LAB — MAGNESIUM: Magnesium: 1.8 mg/dL (ref 1.7–2.4)

## 2021-08-08 NOTE — ED Provider Notes (Signed)
[facility] EMERGENCY DEPARTMENT Provider Note   CSN: [id number] Arrival date & time: 08/08/21  3875     History  Chief Complaint  Patient presents with   Tachycardia ( 120 - 180)     [name] is a 71 y.o. male.  71 yo Male with PMHx significant for HTN, Bradycardia, CAD, aortic stenosis, and MI s/p coronary angioplasty with stent placement ('06) who is BIBA from home for acute episode of palpitations that woke him from sleep this morning. No associated CP, SOB, N/V/D, abdominal pain, recent travel/illness/trauma/ or medication change. No history of similar symptoms. EMS administered fluids, since symptoms have resolved. Currently patient feels well and is agreeable to follow up with his cardiologist.        Home Medications Prior to Admission medications   Medication Sig Start Date End Date Taking? Authorizing Provider  amoxicillin (AMOXIL) 500 MG capsule Take 4 pills one hour prior to dental work 07/20/20  Yes Aundra Dubin, PA-C  aspirin EC 81 MG tablet Take 81 mg by mouth daily.   Yes [provider]  atorvastatin (LIPITOR) 10 MG tablet Take 10 mg by mouth at bedtime.    Yes [provider]  Calcium Citrate-Vitamin D (CALCIUM CITRATE CHEWY BITE PO) Take 500 mg by mouth 3 (three) times daily.   Yes [provider]  doxycycline (VIBRA-TABS) 100 MG tablet Take 1 tablet (100 mg total) by mouth 2 (two) times daily. Patient taking differently: Take 100 mg by mouth 2 (two) times daily. Started on 07-28-21 DS 30 07/28/21  Yes [name], MD  ferrous sulfate 325 (65 FE) MG tablet Take 325 mg by mouth daily.    Yes [provider]  fluticasone (FLONASE) 50 MCG/ACT nasal spray Place 2 sprays into both nostrils daily as needed for allergies.    Yes [provider]  Hypromellose (GENTEAL SEVERE OP) Place 1 drop into the right eye daily as needed (Dry eyes).   Yes [provider]  ketoconazole (NIZORAL) 2 % cream  Apply 1 Application topically daily as needed for rash. 04/25/21  Yes [provider]  levothyroxine (SYNTHROID) 150 MCG tablet Take 150 mcg by mouth daily before breakfast.   Yes [provider]  lisinopril (ZESTRIL) 2.5 MG tablet TAKE 1 TABLET BY MOUTH EVERY DAY Patient taking differently: Take 2.5 mg by mouth daily. 06/06/21  Yes Jerline Pain, MD  metFORMIN (GLUCOPHAGE-XR) 500 MG 24 hr tablet Take 500 mg by mouth 2 (two) times daily. 08/16/18  Yes [provider]  Multiple Vitamin (MULTIVITAMIN WITH MINERALS) TABS tablet Take 1 tablet by mouth in the morning and at bedtime. Gummies Patient taking differently: Take 2 tablets by mouth in the morning and at bedtime. Gummies 04/02/20  Yes Gonfa, Taye T, MD  nitroGLYCERIN (NITROSTAT) 0.4 MG SL tablet PLACE 1 TABLET (0.4 MG TOTAL) UNDER THE TONGUE EVERY 5 (FIVE) MINUTES X 3 DOSES AS NEEDED FOR CHEST PAIN. 11/24/19  Yes Jerline Pain, MD  Omega 3 1000 MG CAPS Take 1,000 mg by mouth daily.   Yes [provider]  Omeprazole 20 MG TBEC Take 20 mg by mouth daily as needed (acid reflux/ indigestion).  03/07/10  Yes [provider]  Valerian Root 450 MG CAPS Take 450 mg by mouth at bedtime.   Yes [provider]  vitamin B-12 (CYANOCOBALAMIN) 1000 MCG tablet Take 1,000 mcg by mouth 3 (three) times a week. Mon, wed, Friday   Yes [provider]      Allergies    Other, Moxifloxacin, Penicillins, Quinolones, and Sulfamethoxazole-trimethoprim    Review of Systems   Review of Systems Negative except as per HPI Physical Exam Updated Vital Signs BP (!) 152/71   Pulse (!) 50   Temp 98 F (36.7 C) (Oral)   Resp 13   SpO2 100%  Physical Exam Vitals and nursing note reviewed.  Constitutional:      General: He is not in acute distress.    Appearance: He is well-developed. He is not diaphoretic.  HENT:     Head: Normocephalic and atraumatic.  Cardiovascular:     Heart sounds: Murmur heard.   Pulmonary:     Effort: Pulmonary effort is normal.     Breath sounds: Normal breath sounds.  Abdominal:     Palpations: Abdomen is soft.     Tenderness: There is no abdominal tenderness.  Musculoskeletal:     Right lower leg: No edema.     Left lower leg: No edema.  Skin:    General: Skin is warm and dry.     Findings: No erythema or rash.  Neurological:     Mental Status: He is alert and oriented to person, place, and time.  Psychiatric:        Behavior: Behavior normal.     ED Results / Procedures / Treatments   Labs (all labs ordered are listed, but only abnormal results are displayed) Labs Reviewed  COMPREHENSIVE METABOLIC PANEL - Abnormal; Notable for the following components:      Result Value   Glucose, Bld 160 (*)    All other components within normal limits  CBC WITH DIFFERENTIAL/PLATELET  MAGNESIUM  TSH    EKG EKG Interpretation  Date/Time:  Monday August 08 2021 02:41:52 EDT Ventricular Rate:  83 PR Interval:  250 QRS Duration: 150 QT Interval:  430 QTC Calculation: 505 R Axis:   -65 Text Interpretation: Sinus rhythm with 1st degree A-V block Right bundle branch block Left anterior fascicular block ** Bifascicular block ** Minimal voltage criteria for LVH, may be normal variant ( R in aVL ) Septal infarct , age undetermined Lateral infarct , age undetermined Abnormal ECG Confirmed by Sherwood Gambler [phone]) on 08/08/2021 12:25:06 PM  Radiology No results found.  Procedures Procedures    Medications Ordered in ED Medications - No data to display  ED Course/ Medical Decision Making/ A&P                           Medical Decision Making  This patient presents to the ED for concern of palpitations with elevated heart rate earlier, this involves an extensive number of treatment options, and is a complaint that carries with it a high risk of complications and morbidity.  The differential diagnosis includes but not limited to dehydration, arrhythmia,  electrolyte disturbance   Co morbidities that complicate the patient evaluation  Hyperlipidemia, hypothyroid, acquired, hypertension, first-degree AV block, MI, diabetes, CAD   Additional history obtained:  Additional history obtained from EMS report of heart rate elevated at 150s on arrival, resolved after fluid bolus. External records from outside source obtained and reviewed including recent visit to orthopedics for ulcer on left foot, does have right below-knee amputation.   Lab Tests:  I Ordered, and personally interpreted labs.  The pertinent results include: CBC unremarkable, CMP with glucose of 160, nonfasting.  Magnesium is normal.   Cardiac Monitoring: / EKG:  The  patient was maintained on a cardiac monitor.  I personally viewed and interpreted the cardiac monitored which showed an underlying rhythm of: Distraction, first-degree AV block, rate 83, unchanged from prior.  Problem List / ED Course / Critical interventions / Medication management  71 yo male with complaint of palpitations earlier this morning which woke him from his sleep.  Patient was found to have a heart rate in the 150s with EMS, they administered IV fluids and his rate improved upon arrival in the emergency room.  After extensive ER wait with multiple vital assessments, patient is stable, asymptomatic, feeling well and agreeable to follow-up with his cardiologist this week. I have reviewed the patients home medicines and have made adjustments as needed   Social Determinants of Health:  Has cardiology to follow-up with outpatient.   Test / Admission - Considered:  Consider admission however patient has had extensive (almost 11-hour) weight at the emergency room today.  His vitals were reviewed and have been stable.  He has if anything been bradycardic while in the ER.  Patient is agreeable to return to ER as needed otherwise follow-up with his cardiologist.         Final Clinical Impression(s) /  ED Diagnoses Final diagnoses:  Tachycardia    Rx / DC Orders ED Discharge Orders     None         Tacy Learn, PA-C 08/08/21 1511    Sherwood Gambler, MD 08/09/21 [phone]

## 2021-08-08 NOTE — Progress Notes (Unsigned)
Office Visit    Patient Name: [name] Date of Encounter: 08/08/2021  Primary Care Provider:  [name], MD Primary Cardiologist:  Candee Furbish, MD Primary Electrophysiologist: None  Chief Complaint    [name] is a 71 y.o. male with PMH of CAD MI 2006 s/p DES to ramus, proximal LAD x2 with diffuse disease in distal RCA, chronic diastolic CHF, aortic stenosis GERD, trifascicular block (first-degree AV block,RBBB, LAFB), morbid obesity s/p gastric bypass 2012, HLD, hypothyroidism, sleep apnea, Right BKA  and DM type II who presents today for follow-up of tachycardia.  Past Medical History    Past Medical History:  Diagnosis Date   Anemia    low iron   Arthritis    Coronary artery disease 2006   a. remote stenting to ramus, prox LAD x2.   Dental crowns present    Diabetes mellitus    First degree AV block    GERD (gastroesophageal reflux disease)    Heart attack ([place]) 06/2004   Heart murmur    aortic   Hx MRSA infection 02/2006   Hyperlipidemia    Hypertension    hx. of - has not been on med. since losing wt. after gastric bypass   Hypothyroidism    Morbid obesity ([place])    Mucoid cyst of joint 09/2011   left thumb   Sleep apnea    sleep study 03/29/2011; no CPAP use, lost >130lbs   Trifascicular block    Past Surgical History:  Procedure Laterality Date   AMPUTATION Left 04/19/2016   Procedure: Left Foot 4th Toe Amputation vs. Transmetatarsal;  Surgeon: [name], MD;  Location: [place];  Service: Orthopedics;  Laterality: Left;   BIOPSY  04/07/2019   Procedure: BIOPSY;  Surgeon: Otis Brace, MD;  Location: [facility];  Service: Gastroenterology;;   CATARACT EXTRACTION  02/2010; 03/2010   COLONOSCOPY WITH PROPOFOL N/A 09/14/2014   Procedure: COLONOSCOPY WITH PROPOFOL;  Surgeon: Garlan Fair, MD;  Location: [facility];  Service: Endoscopy;  Laterality: N/A;   COLONOSCOPY WITH PROPOFOL N/A 04/07/2019   Procedure: COLONOSCOPY WITH PROPOFOL;  Surgeon:  Otis Brace, MD;  Location: [facility];  Service: Gastroenterology;  Laterality: N/A;   CORONARY ANGIOPLASTY WITH STENT PLACEMENT  07/12/2004; 08/03/2004   total of 3 stents   I & D EXTREMITY Right 06/04/2014   Procedure: IRRIGATION AND DEBRIDEMENT EXTREMITY;  Surgeon: [name], MD;  Location: [place];  Service: Orthopedics;  Laterality: Right;   MASS EXCISION  10/04/2011   Procedure: EXCISION MASS;  Surgeon: Wynonia Sours, MD;  Location: [place];  Service: Orthopedics;  Laterality: Left;  excision cyst debridment ip joint of left thumb   PROSTATECTOMY     right below knee amputation  2018   ROUX-EN-Y GASTRIC BYPASS  10/10/2010   laparoscopic   SHOULDER ARTHROSCOPY WITH ROTATOR CUFF REPAIR AND SUBACROMIAL DECOMPRESSION Right 03/23/2020   Procedure: RIGHT SHOULDER ARTHROSCOPY WITH DEBRIDEMENT AND  ROTATOR CUFF REPAIR;  Surgeon: [name], MD;  Location: [place];  Service: Orthopedics;  Laterality: Right;   TOE AMPUTATION  02/23/2006   left foot second ray amputation   TOTAL HIP ARTHROPLASTY Right 03/29/2020   Procedure: TOTAL HIP ARTHROPLASTY POSTERIOR APPROACH;  Surgeon: [name], MD;  Location: [place];  Service: Orthopedics;  Laterality: Right;   TRANSMETATARSAL AMPUTATION Left    TRIGGER FINGER RELEASE Right 09/16/2019   Procedure: RELEASE TRIGGER FINGER/A-1 PULLEY;  Surgeon: Daryll Brod, MD;  Location:  [place];  Service: Orthopedics;  Laterality: Right;  IV REGIONAL FOREARM BLOCK    Allergies  Allergies  Allergen Reactions   Other Other (See Comments)   Moxifloxacin Rash    Avelox Rash at injection site   Penicillins Rash     Has patient had a PCN reaction causing immediate rash, facial/tongue/throat swelling, SOB or lightheadedness with hypotension: #  #  #  YES  #  #  #  Has patient had a PCN reaction causing severe rash involving mucus membranes or skin necrosis: No Has patient had a PCN reaction that required  hospitalization unknown Has patient had a PCN reaction occurring within the last 10 years: No If all of the above answers are "NO", then may proceed with Cephalosporin use.   Quinolones Itching   Sulfamethoxazole-Trimethoprim Itching, Rash and Other (See Comments)    History of Present Illness    [name] is a 71 year old male with the above-mentioned past medical history who presents today for tachycardia.  History significant for past MI and cardiac stents in 2006. He has been followed by Dr. Marlou Porch since 2015.  Nuclear stress test completed 2017 for ED visit of chest pain with no reversible ischemia or infarct, EF 46% with mild global hypokinesis.  2D echo was completed 09/2015 that showed EF of 50-55%, grade 2 DD with elevated LV filling pressures and biatrial enlargement, mild RV, calcified aortic valve with mild AS and mild AI. He has chronic SP/bifascicular block with heart rates in the 30s at times that were noted during his gastric bypass surgery.  During last visit with Dr. Marlou Porch patient denied any syncope or chest pain   He was seen in the ED at [facility] on 08/08/2021 with complaint of acute palpitations that woke him up from sleeping.  Per EMS heart rate was elevated at 150 bpm and resolved following fluid resuscitation on arrival after extensive 11-hour ER wait.  Since last being seen in the office patient reports***.  Patient denies chest pain, palpitations, dyspnea, PND, orthopnea, nausea, vomiting, dizziness, syncope, edema, weight gain, or early satiety.     ***Notes: GDMT with ASA 81 mg, Lipitor 10 mg, Lisinopril 2.5 mg  Home Medications    Current Outpatient Medications  Medication Sig Dispense Refill   amoxicillin (AMOXIL) 500 MG capsule Take 4 pills one hour prior to dental work 8 capsule 2   aspirin EC 81 MG tablet Take 81 mg by mouth daily.     atorvastatin (LIPITOR) 10 MG tablet Take 10 mg by mouth at bedtime.      Calcium Citrate-Vitamin D (CALCIUM CITRATE  CHEWY BITE PO) Take 500 mg by mouth 3 (three) times daily.     doxycycline (VIBRA-TABS) 100 MG tablet Take 1 tablet (100 mg total) by mouth 2 (two) times daily. (Patient taking differently: Take 100 mg by mouth 2 (two) times daily. Started on 07-28-21 DS 30) 60 tablet 0   ferrous sulfate 325 (65 FE) MG tablet Take 325 mg by mouth daily.      fluticasone (FLONASE) 50 MCG/ACT nasal spray Place 2 sprays into both nostrils daily as needed for allergies.      Hypromellose (GENTEAL SEVERE OP) Place 1 drop into the right eye daily as needed (Dry eyes).     ketoconazole (NIZORAL) 2 % cream Apply 1 Application topically daily as needed for rash.     levothyroxine (SYNTHROID) 150 MCG tablet Take 150 mcg by mouth daily before breakfast.  lisinopril (ZESTRIL) 2.5 MG tablet TAKE 1 TABLET BY MOUTH EVERY DAY (Patient taking differently: Take 2.5 mg by mouth daily.) 90 tablet 1   metFORMIN (GLUCOPHAGE-XR) 500 MG 24 hr tablet Take 500 mg by mouth 2 (two) times daily.     Multiple Vitamin (MULTIVITAMIN WITH MINERALS) TABS tablet Take 1 tablet by mouth in the morning and at bedtime. Gummies (Patient taking differently: Take 2 tablets by mouth in the morning and at bedtime. Gummies)     nitroGLYCERIN (NITROSTAT) 0.4 MG SL tablet PLACE 1 TABLET (0.4 MG TOTAL) UNDER THE TONGUE EVERY 5 (FIVE) MINUTES X 3 DOSES AS NEEDED FOR CHEST PAIN. 25 tablet 3   Omega 3 1000 MG CAPS Take 1,000 mg by mouth daily.     Omeprazole 20 MG TBEC Take 20 mg by mouth daily as needed (acid reflux/ indigestion).      Valerian Root 450 MG CAPS Take 450 mg by mouth at bedtime.     vitamin B-12 (CYANOCOBALAMIN) 1000 MCG tablet Take 1,000 mcg by mouth 3 (three) times a week. Mon, wed, Friday     No current facility-administered medications for this visit.     Review of Systems  Please see the history of present illness.    (+)*** (+)***  All other systems reviewed and are otherwise negative except as noted above.  Physical Exam    Wt  Readings from Last 3 Encounters:  01/07/21 244 lb (110.7 kg)  09/30/20 245 lb (111.1 kg)  06/17/20 245 lb (111.1 kg)   JF:HLKTG were no vitals filed for this visit.,There is no height or weight on file to calculate BMI.  Constitutional:      Appearance: Healthy appearance. Not in distress.  Neck:     Vascular: JVD normal.  Pulmonary:     Effort: Pulmonary effort is normal.     Breath sounds: No wheezing. No rales. Diminished in the bases Cardiovascular:     Normal rate. Regular rhythm. Normal S1. Normal S2.      Murmurs: There is no murmur.  Edema:    Peripheral edema absent.  Abdominal:     Palpations: Abdomen is soft non tender. There is no hepatomegaly.  Skin:    General: Skin is warm and dry.  Neurological:     General: No focal deficit present.     Mental Status: Alert and oriented to person, place and time.     Cranial Nerves: Cranial nerves are intact.  EKG/LABS/Other Studies Reviewed    ECG personally reviewed by me today - ***  Risk Assessment/Calculations:   {Does this patient have ATRIAL FIBRILLATION?:[id number]}        Lab Results  Component Value Date   WBC 6.8 08/08/2021   HGB 15.9 08/08/2021   HCT 47.2 08/08/2021   MCV 91.3 08/08/2021   PLT 197 08/08/2021   Lab Results  Component Value Date   CREATININE 0.91 08/08/2021   BUN 11 08/08/2021   NA 140 08/08/2021   K 3.6 08/08/2021   CL 106 08/08/2021   CO2 22 08/08/2021   Lab Results  Component Value Date   ALT 22 08/08/2021   AST 21 08/08/2021   ALKPHOS 65 08/08/2021   BILITOT 0.8 08/08/2021   No results found for: "CHOL", "HDL", "LDLCALC", "LDLDIRECT", "TRIG", "CHOLHDL"  Lab Results  Component Value Date   HGBA1C 6.8 (H) 03/28/2020    Assessment & Plan    1.  Palpitations  2.  Coronary artery disease  3.  Asymptomatic bradycardia with  RBBB and trifascicular block  4.  DM type II  5.  Aortic stenosis: -Most recent 2D echo completed in January revealing 48 mm aortic dilation and  mild aortic stenosis    Disposition: Follow-up with Candee Furbish, MD or APP in *** months {Are you ordering a CV Procedure (e.g. stress test, cath, DCCV, TEE, etc)?   Press F2        :[id number]}   Medication Adjustments/Labs and Tests Ordered: Current medicines are reviewed at length with the patient today.  Concerns regarding medicines are outlined above.   Signed, Mable Fill, Marissa Nestle, NP 08/08/2021, 3:32 PM [place]

## 2021-08-08 NOTE — Telephone Encounter (Signed)
Pt called back and made aware Dr. Marlou Porch was unavailable this week. ( Regarding request for appointment below....)  Pt told that we had appointments available tomorrow at [facility] on [address] stated that he could come in for the 08/09/21 - 215 pm appointment with provider Ambrose Pancoast.  Follow up appointment for recent ER visit.   Appointment scheduled.

## 2021-08-08 NOTE — ED Notes (Signed)
Nurse tech has been continuing to monitor pt's tachycardia. Pt's pulse rate has dropped down to low 50s.

## 2021-08-08 NOTE — ED Provider Triage Note (Signed)
Emergency Medicine Provider Triage Evaluation Note  [name] , a 71 y.o. male  was evaluated in triage.  Pt complains of palpitations that started @ midnight. Has felt his heart racing, smart watch shows HR in the 130s-150s, called EMS. EMS reported patient w/ HR as high as 150s, gave fluids en route, patient states he is now feeling better. He denies chest pain, dyspnea, dizziness, lightheadedness, or syncope.  Review of Systems  Per above.   Physical Exam  BP 115/79   Pulse 79   Temp 98 F (36.7 C) (Oral)   Resp 16   SpO2 99%  Gen:   Awake, no distress   Resp:  Normal effort  MSK:   Moves extremities without difficulty  Cardiac:  Heart RRR currently   Medical Decision Making  Medically screening exam initiated at 3:01 AM.  Appropriate orders placed.  [name] was informed that the remainder of the evaluation will be completed by another provider, this initial triage assessment does not replace that evaluation, and the importance of remaining in the ED until their evaluation is complete.  Palpitations.    Amaryllis Dyke, [place] 08/08/21 0302

## 2021-08-08 NOTE — ED Triage Notes (Signed)
Patient arrived with EMS from home , woke up this morning with heart racing 120's to 180's , denies chest pain or SOB , no palpitations or lightheadedness. He received NS 1 liter by EMS IV prior to arrival.

## 2021-08-08 NOTE — Discharge Instructions (Addendum)
Follow up with your cardiologist office this week for recheck. Return to the ER for any worsening or concerning symptoms. Home to hydrate, monitor blood pressure and heart rate and return as needed.

## 2021-08-08 NOTE — Telephone Encounter (Signed)
  Per MyChart scheduling message:  Experienced a heart issue this morning with rapid hear beat up to 160 BPM.  Called EMT and hear rate was fluctuation between 160 down to 120 and back up.  Spent 10 hours in ER but heart rate returned to normal and other tests were normal.  No explanation for this possible AFIB as normally heart rate is in 50's.  They want me to see Dr. Gillian Shields this week.

## 2021-08-09 ENCOUNTER — Encounter: Payer: Self-pay | Admitting: Nurse Practitioner

## 2021-08-09 ENCOUNTER — Ambulatory Visit (INDEPENDENT_AMBULATORY_CARE_PROVIDER_SITE_OTHER): Payer: Medicare Other

## 2021-08-09 ENCOUNTER — Ambulatory Visit (INDEPENDENT_AMBULATORY_CARE_PROVIDER_SITE_OTHER): Payer: Medicare Other | Admitting: Nurse Practitioner

## 2021-08-09 VITALS — BP 120/60 | HR 53 | Ht >= 80 in | Wt 244.0 lb

## 2021-08-09 DIAGNOSIS — I10 Essential (primary) hypertension: Secondary | ICD-10-CM | POA: Diagnosis not present

## 2021-08-09 DIAGNOSIS — R002 Palpitations: Secondary | ICD-10-CM

## 2021-08-09 DIAGNOSIS — I453 Trifascicular block: Secondary | ICD-10-CM

## 2021-08-09 DIAGNOSIS — E785 Hyperlipidemia, unspecified: Secondary | ICD-10-CM

## 2021-08-09 DIAGNOSIS — I2510 Atherosclerotic heart disease of native coronary artery without angina pectoris: Secondary | ICD-10-CM

## 2021-08-09 DIAGNOSIS — Z9884 Bariatric surgery status: Secondary | ICD-10-CM

## 2021-08-09 DIAGNOSIS — E782 Mixed hyperlipidemia: Secondary | ICD-10-CM

## 2021-08-09 NOTE — Progress Notes (Unsigned)
Enrolled for [employer] to mail a ZIO XT long term holter monitor to the patients address on file.   Dr. Skains to read. 

## 2021-08-09 NOTE — Patient Instructions (Addendum)
Medication Instructions:  Your physician recommends that you continue on your current medications as directed. Please refer to the Current Medication list given to you today.   *If you need a refill on your cardiac medications before your next appointment, please call your pharmacy*  Testing/Procedures: Your physician has recommended that you wear an event monitor. Event monitors are medical devices that record the heart's electrical activity. Doctors most often [place] these monitors to diagnose arrhythmias. Arrhythmias are problems with the speed or rhythm of the heartbeat. The monitor is a small, portable device. You can wear one while you do your normal daily activities. This is usually used to diagnose what is causing palpitations/syncope (passing out).    Follow-Up: At [facility], you and your health needs are our priority.  As part of our continuing mission to provide you with exceptional heart care, we have created designated Provider Care Teams.  These Care Teams include your primary Cardiologist (physician) and Advanced Practice Providers (APPs -  Physician Assistants and Nurse Practitioners) who all work together to provide you with the care you need, when you need it.   Your next appointment:   4 week(s)  The format for your next appointment:   In Person  Provider:   Ambrose Pancoast NP :1}   Other Instructions ZIO XT- Long Term Monitor Instructions  Your physician has requested you wear a ZIO patch monitor for 14 days.  This is a single patch monitor. [employer] supplies one patch monitor per enrollment. Additional stickers are not available. Please do not apply patch if you will be having a Nuclear Stress Test,  Echocardiogram, Cardiac CT, MRI, or Chest Xray during the period you would be wearing the  monitor. The patch cannot be worn during these tests. You cannot remove and re-apply the  ZIO XT patch monitor.  Your ZIO patch monitor will be mailed 3 day USPS to your address on  file. It may take 3-5 days  to receive your monitor after you have been enrolled.  Once you have received your monitor, please review the enclosed instructions. Your monitor  has already been registered assigning a specific monitor serial # to you.  Billing and Patient Assistance Program Information  We have supplied [employer] with any of your insurance information on file for billing purposes. [employer] offers a sliding scale Patient Assistance Program for patients that do not have  insurance, or whose insurance does not completely cover the cost of the ZIO monitor.  You must apply for the Patient Assistance Program to qualify for this discounted rate.  To apply, please call [employer] at [phone], select option 4, select option 2, ask to apply for  Patient Assistance Program. Theodore Demark will ask your household income, and how many people  are in your household. They will quote your out-of-pocket cost based on that information.  [employer] will also be able to set up a 31-month interest-free payment plan if needed.  Applying the monitor   Shave hair from upper left chest.  Hold abrader disc by orange tab. Rub abrader in 40 strokes over the upper left chest as  indicated in your monitor instructions.  Clean area with 4 enclosed alcohol pads. Let dry.  Apply patch as indicated in monitor instructions. Patch will be placed under collarbone on left  side of chest with arrow pointing upward.  Rub patch adhesive wings for 2 minutes. Remove white label marked "1". Remove the white  label marked "2". Rub patch adhesive wings for 2 additional minutes.  While looking in a mirror, press and release button in center of patch. A small green light will  flash 3-4 times. This will be your only indicator that the monitor has been turned on.  Do not shower for the first 24 hours. You may shower after the first 24 hours.  Press the button if you feel a symptom. You will hear a small click. Record Date, Time and   Symptom in the Patient Logbook.  When you are ready to remove the patch, follow instructions on the last 2 pages of Patient  Logbook. Stick patch monitor onto the last page of Patient Logbook.  Place Patient Logbook in the blue and white box. Use locking tab on box and tape box closed  securely. The blue and white box has prepaid postage on it. Please place it in the mailbox as  soon as possible. Your physician should have your test results approximately 7 days after the  monitor has been mailed back to [facility].  Call Anton at [phone] if you have questions regarding  your ZIO XT patch monitor. Call them immediately if you see an orange light blinking on your  monitor.  If your monitor falls off in less than 4 days, contact our Monitor department at [phone].  If your monitor becomes loose or falls off after 4 days call [employer] at [phone] for  suggestions on securing your monitor

## 2021-08-11 DIAGNOSIS — R002 Palpitations: Secondary | ICD-10-CM

## 2021-08-16 ENCOUNTER — Encounter: Payer: Self-pay | Admitting: Orthopedic Surgery

## 2021-08-16 ENCOUNTER — Ambulatory Visit (INDEPENDENT_AMBULATORY_CARE_PROVIDER_SITE_OTHER): Payer: Medicare Other | Admitting: Orthopedic Surgery

## 2021-08-16 DIAGNOSIS — I2510 Atherosclerotic heart disease of native coronary artery without angina pectoris: Secondary | ICD-10-CM | POA: Diagnosis not present

## 2021-08-16 DIAGNOSIS — S88111A Complete traumatic amputation at level between knee and ankle, right lower leg, initial encounter: Secondary | ICD-10-CM

## 2021-08-16 DIAGNOSIS — L97521 Non-pressure chronic ulcer of other part of left foot limited to breakdown of skin: Secondary | ICD-10-CM | POA: Diagnosis not present

## 2021-08-16 DIAGNOSIS — Z89432 Acquired absence of left foot: Secondary | ICD-10-CM

## 2021-08-16 DIAGNOSIS — Z89511 Acquired absence of right leg below knee: Secondary | ICD-10-CM | POA: Diagnosis not present

## 2021-08-16 NOTE — Progress Notes (Signed)
Office Visit Note   Patient: [name]           Date of Birth: [birth year]           MRN: [id number] Visit Date: 08/16/2021              Requested by: [name], MD [place] [employer] [address],  [place] [address] PCP: [name], MD  Chief Complaint  Patient presents with   Left Foot - Wound Check    Hx left transmet amputation      HPI: Patient is a 71 year old gentleman who presents for follow-up he has a right transtibial amputation and left transmetatarsal amputation he has had an ulcer over the residual limb on the left foot and is on doxycycline.  Assessment & Plan: Visit Diagnoses:  1. Below-knee amputation of right lower extremity ([place])   2. History of transmetatarsal amputation of left foot ([place])   3. Non-pressure chronic ulcer of other part of left foot limited to breakdown of skin ([place])     Plan: Patient will complete his doxycycline continue with routine wound care on the left foot.  Follow-Up Instructions: Return in about 4 weeks (around 09/13/2021).   Ortho Exam  Patient is alert, oriented, no adenopathy, well-dressed, normal affect, normal respiratory effort. Examination patient has a normal gait with a new prosthesis on the right.  Left foot has a good carbon plate and custom orthotic and spacer in the shoe.  He has a strong dorsalis pedis pulse on the left.  The wound is healing well no redness no cellulitis no drainage with healthy granulation tissue the wound is 1 cm diameter 1 mm deep.  Imaging: No results found. No images are attached to the encounter.  Labs: Lab Results  Component Value Date   HGBA1C 6.8 (H) 03/28/2020   HGBA1C 6.5 (H) 09/26/2015   HGBA1C 7.0 (H) 06/04/2014   REPTSTATUS 08/01/2016 FINAL 07/30/2016   GRAMSTAIN  06/04/2014    MODERATE WBC PRESENT, PREDOMINANTLY PMN NO SQUAMOUS EPITHELIAL CELLS SEEN MODERATE GRAM POSITIVE COCCI IN CLUSTERS Performed at [facility]  06/04/2014    MODERATE  WBC PRESENT, PREDOMINANTLY PMN NO SQUAMOUS EPITHELIAL CELLS SEEN MODERATE GRAM POSITIVE COCCI IN PAIRS Performed at [facility]  07/30/2016    NO GROWTH Performed at [facility] [address] [place], [place] [address]      Lab Results  Component Value Date   ALBUMIN 3.9 08/08/2021   ALBUMIN 3.5 03/28/2020   ALBUMIN 3.5 03/28/2020    Lab Results  Component Value Date   MG 1.8 08/08/2021   MG 2.0 04/02/2020   Lab Results  Component Value Date   VD25OH 53.03 03/28/2020    No results found for: "PREALBUMIN"    Latest Ref Rng & Units 08/08/2021    2:50 AM 04/01/2020    3:31 AM 03/31/2020    5:23 AM  CBC EXTENDED  WBC 4.0 - 10.5 K/uL 6.8  8.6  9.0   RBC 4.22 - 5.81 MIL/uL 5.17  3.92  3.98   Hemoglobin 13.0 - 17.0 g/dL 15.9  12.3  12.5   HCT 39.0 - 52.0 % 47.2  36.0  36.0   Platelets 150 - 400 K/uL 197  205  181   NEUT# 1.7 - 7.7 K/uL 4.4     Lymph# 0.7 - 4.0 K/uL 1.3        There is no height  or weight on file to calculate BMI.  Orders:  No orders of the defined types were placed in this encounter.  No orders of the defined types were placed in this encounter.    Procedures: No procedures performed  Clinical Data: No additional findings.  ROS:  All other systems negative, except as noted in the HPI. Review of Systems  Objective: Vital Signs: There were no vitals taken for this visit.  Specialty Comments:  No specialty comments available.  PMFS History: Patient Active Problem List   Diagnosis Date Noted   Hypotension 01/07/2021   S/P arthroscopy of right shoulder 06/02/2020   Closed fracture of neck of right femur ([place])    Hip fracture ([place]) 03/27/2020   Nontraumatic complete tear of right rotator cuff 02/26/2020   S/P transmetatarsal amputation of foot, left ([place]) 04/25/2016   Chronic osteomyelitis of toe, left (H[facility])    Chronic diastolic CHF (congestive heart failure) ([place]) 09/25/2015   Aortic stenosis 08/04/2014    Diabetic foot infection ([place]) 06/03/2014   Peripheral vascular disease ([place]) 06/03/2014   Coronary artery disease involving native coronary artery of native heart without angina pectoris 07/29/2013   Trifascicular block 07/29/2013   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 07/26/2011   Hypothyroidism 01/22/2007   Type 2 diabetes mellitus with vascular disease ([place]) 01/22/2007   HYPERLIPIDEMIA, MIXED 01/22/2007   MYOCARDIAL INFARCTION, HX OF 01/22/2007   ANEMIA, IRON DEFICIENCY, HX OF 01/22/2007   Past Medical History:  Diagnosis Date   Anemia    low iron   Arthritis    Coronary artery disease 2006   a. remote stenting to ramus, prox LAD x2.   Dental crowns present    Diabetes mellitus    First degree AV block    GERD (gastroesophageal reflux disease)    Heart attack ([place]) 06/2004   Heart murmur    aortic   Hx MRSA infection 02/2006   Hyperlipidemia    Hypertension    hx. of - has not been on med. since losing wt. after gastric bypass   Hypothyroidism    Morbid obesity ([place])    Mucoid cyst of joint 09/2011   left thumb   Sleep apnea    sleep study 03/29/2011; no CPAP use, lost >130lbs   Trifascicular block     Family History  Problem Relation Age of Onset   Heart disease Mother     Past Surgical History:  Procedure Laterality Date   AMPUTATION Left 04/19/2016   Procedure: Left Foot 4th Toe Amputation vs. Transmetatarsal;  Surgeon: [name], MD;  Location: [facility];  Service: Orthopedics;  Laterality: Left;   BIOPSY  04/07/2019   Procedure: BIOPSY;  Surgeon: Otis Brace, MD;  [facility];  Service: Gastroenterology;;   CATARACT EXTRACTION  02/2010; 03/2010   COLONOSCOPY WITH PROPOFOL N/A 09/14/2014   Procedure: COLONOSCOPY WITH PROPOFOL;  Surgeon: Garlan Fair, MD;  [facility];  Service: Endoscopy;  Laterality: N/A;   COLONOSCOPY WITH PROPOFOL N/A 04/07/2019   Procedure: COLONOSCOPY WITH PROPOFOL;  Surgeon: Otis Brace, MD;  [facility];  Service: Gastroenterology;  Laterality: N/A;   CORONARY ANGIOPLASTY WITH STENT PLACEMENT  07/12/2004; 08/03/2004   total of 3 stents   I & D EXTREMITY Right 06/04/2014   Procedure: IRRIGATION AND DEBRIDEMENT EXTREMITY;  Surgeon: [name], MD;  Location: [facility];  Service: Orthopedics;  Laterality: Right;   MASS EXCISION  10/04/2011   Procedure: EXCISION MASS;  Surgeon: Dominica Severin  Beola Cord, MD;  Location: [facility];  Service: Orthopedics;  Laterality: Left;  excision cyst debridment ip joint of left thumb   PROSTATECTOMY     right below knee amputation  2018   ROUX-EN-Y GASTRIC BYPASS  10/10/2010   laparoscopic   SHOULDER ARTHROSCOPY WITH ROTATOR CUFF REPAIR AND SUBACROMIAL DECOMPRESSION Right 03/23/2020   Procedure: RIGHT SHOULDER ARTHROSCOPY WITH DEBRIDEMENT AND  ROTATOR CUFF REPAIR;  Surgeon: [name], MD;  Location: [facility];  Service: Orthopedics;  Laterality: Right;   TOE AMPUTATION  02/23/2006   left foot second ray amputation   TOTAL HIP ARTHROPLASTY Right 03/29/2020   Procedure: TOTAL HIP ARTHROPLASTY POSTERIOR APPROACH;  Surgeon: [name], MD;  Location: [facility];  Service: Orthopedics;  Laterality: Right;   TRANSMETATARSAL AMPUTATION Left    TRIGGER FINGER RELEASE Right 09/16/2019   Procedure: RELEASE TRIGGER FINGER/A-1 PULLEY;  Surgeon: Daryll Brod, MD;  Location: [facility];  Service: Orthopedics;  Laterality: Right;  IV REGIONAL FOREARM BLOCK   Social History   Occupational History   Occupation: Engineer, maintenance (IT) taxes  Tobacco Use   Smoking status: Never   Smokeless tobacco: Never  Substance and Sexual Activity   Alcohol use: No   Drug use: No   Sexual activity: Yes

## 2021-08-22 ENCOUNTER — Ambulatory Visit: Payer: Medicare Other | Admitting: Orthopedic Surgery

## 2021-08-23 DIAGNOSIS — E11621 Type 2 diabetes mellitus with foot ulcer: Secondary | ICD-10-CM | POA: Diagnosis not present

## 2021-08-23 DIAGNOSIS — R000 Tachycardia, unspecified: Secondary | ICD-10-CM | POA: Diagnosis not present

## 2021-08-23 DIAGNOSIS — I10 Essential (primary) hypertension: Secondary | ICD-10-CM | POA: Diagnosis not present

## 2021-08-23 DIAGNOSIS — E1149 Type 2 diabetes mellitus with other diabetic neurological complication: Secondary | ICD-10-CM | POA: Diagnosis not present

## 2021-09-01 ENCOUNTER — Encounter: Payer: Self-pay | Admitting: Orthopedic Surgery

## 2021-09-01 ENCOUNTER — Ambulatory Visit (INDEPENDENT_AMBULATORY_CARE_PROVIDER_SITE_OTHER): Payer: Medicare Other | Admitting: Orthopedic Surgery

## 2021-09-01 DIAGNOSIS — I2510 Atherosclerotic heart disease of native coronary artery without angina pectoris: Secondary | ICD-10-CM | POA: Diagnosis not present

## 2021-09-01 DIAGNOSIS — Z89432 Acquired absence of left foot: Secondary | ICD-10-CM

## 2021-09-01 DIAGNOSIS — L97521 Non-pressure chronic ulcer of other part of left foot limited to breakdown of skin: Secondary | ICD-10-CM | POA: Diagnosis not present

## 2021-09-01 DIAGNOSIS — Z89511 Acquired absence of right leg below knee: Secondary | ICD-10-CM | POA: Diagnosis not present

## 2021-09-01 DIAGNOSIS — R002 Palpitations: Secondary | ICD-10-CM | POA: Diagnosis not present

## 2021-09-01 DIAGNOSIS — S88111A Complete traumatic amputation at level between knee and ankle, right lower leg, initial encounter: Secondary | ICD-10-CM

## 2021-09-01 MED ORDER — SULFAMETHOXAZOLE-TRIMETHOPRIM 800-160 MG PO TABS: 1.0000 | ORAL_TABLET | Freq: Two times a day (BID) | ORAL | 0 refills | Status: DC

## 2021-09-01 NOTE — Progress Notes (Signed)
Office Visit    Patient Name: [name] Date of Encounter: 09/05/2021  Primary Care Provider:  [name], MD Primary Cardiologist:  Candee Furbish, MD Primary Electrophysiologist: None  Chief Complaint    [name] is a 71 y.o. male with PMH of CAD MI 2006 s/p DES to ramus, proximal LAD x2 with diffuse disease in distal RCA, chronic diastolic CHF, aortic stenosis GERD, trifascicular block (first-degree AV block,RBBB, LAFB), morbid obesity s/p gastric bypass 2012, HLD, hypothyroidism, sleep apnea, Right BKA  and DM type II who presents today for follow-up of tachycardia.  Past Medical History    Past Medical History:  Diagnosis Date   Anemia    low iron   Arthritis    Coronary artery disease 2006   a. remote stenting to ramus, prox LAD x2.   Dental crowns present    Diabetes mellitus    First degree AV block    GERD (gastroesophageal reflux disease)    Heart attack ([place]) 06/2004   Heart murmur    aortic   Hx MRSA infection 02/2006   Hyperlipidemia    Hypertension    hx. of - has not been on med. since losing wt. after gastric bypass   Hypothyroidism    Morbid obesity ([place])    Mucoid cyst of joint 09/2011   left thumb   Sleep apnea    sleep study 03/29/2011; no CPAP use, lost >130lbs   Trifascicular block    Past Surgical History:  Procedure Laterality Date   AMPUTATION Left 04/19/2016   Procedure: Left Foot 4th Toe Amputation vs. Transmetatarsal;  Surgeon: [name], MD;  Location: [facility];  Service: Orthopedics;  Laterality: Left;   BIOPSY  04/07/2019   Procedure: BIOPSY;  Surgeon: Otis Brace, MD;  [facility];  Service: Gastroenterology;;   CATARACT EXTRACTION  02/2010; 03/2010   COLONOSCOPY WITH PROPOFOL N/A 09/14/2014   Procedure: COLONOSCOPY WITH PROPOFOL;  Surgeon: Garlan Fair, MD;  [facility];  Service: Endoscopy;  Laterality: N/A;   COLONOSCOPY WITH PROPOFOL N/A 04/07/2019   Procedure: COLONOSCOPY WITH PROPOFOL;  Surgeon:  Otis Brace, MD;  [facility];  Service: Gastroenterology;  Laterality: N/A;   CORONARY ANGIOPLASTY WITH STENT PLACEMENT  07/12/2004; 08/03/2004   total of 3 stents   I & D EXTREMITY Right 06/04/2014   Procedure: IRRIGATION AND DEBRIDEMENT EXTREMITY;  Surgeon: [name], MD;  Location: [facility];  Service: Orthopedics;  Laterality: Right;   MASS EXCISION  10/04/2011   Procedure: EXCISION MASS;  Surgeon: Wynonia Sours, MD;  Location: [facility];  Service: Orthopedics;  Laterality: Left;  excision cyst debridment ip joint of left thumb   PROSTATECTOMY     right below knee amputation  2018   ROUX-EN-Y GASTRIC BYPASS  10/10/2010   laparoscopic   SHOULDER ARTHROSCOPY WITH ROTATOR CUFF REPAIR AND SUBACROMIAL DECOMPRESSION Right 03/23/2020   Procedure: RIGHT SHOULDER ARTHROSCOPY WITH DEBRIDEMENT AND  ROTATOR CUFF REPAIR;  Surgeon: [name], MD;  Location: [facility];  Service: Orthopedics;  Laterality: Right;   TOE AMPUTATION  02/23/2006   left foot second ray amputation   TOTAL HIP ARTHROPLASTY Right 03/29/2020   Procedure: TOTAL HIP ARTHROPLASTY POSTERIOR APPROACH;  Surgeon: [name], MD;  Location: [facility];  Service: Orthopedics;  Laterality: Right;   TRANSMETATARSAL AMPUTATION Left    TRIGGER FINGER RELEASE Right 09/16/2019   Procedure: RELEASE TRIGGER FINGER/A-1 PULLEY;  Surgeon: Daryll Brod, MD;  Location:  [facility];  Service: Orthopedics;  Laterality: Right;  IV REGIONAL FOREARM BLOCK    Allergies  Allergies  Allergen Reactions   Other Other (See Comments)   Moxifloxacin Rash    Avelox Rash at injection site   Penicillins Rash     Has patient had a PCN reaction causing immediate rash, facial/tongue/throat swelling, SOB or lightheadedness with hypotension: #  #  #  YES  #  #  #  Has patient had a PCN reaction causing severe rash involving mucus membranes or skin necrosis: No Has patient had a PCN reaction that required  hospitalization unknown Has patient had a PCN reaction occurring within the last 10 years: No If all of the above answers are "NO", then may proceed with Cephalosporin use.   Quinolones Itching   Sulfamethoxazole-Trimethoprim Itching, Rash and Other (See Comments)    History of Present Illness    [name] is a 71 year old male with the above-mentioned past medical history who presents today for tachycardia.  History significant for past MI and cardiac stents in 2006. He has been followed by Dr. Marlou Porch since 2015.  Nuclear stress test completed 2017 for ED visit of chest pain with no reversible ischemia or infarct, EF 46% with mild global hypokinesis.  2D echo was completed 09/2015 that showed EF of 50-55%, grade 2 DD with elevated LV filling pressures and biatrial enlargement, mild RV, calcified aortic valve with mild AS and mild AI. He has chronic SP/bifascicular block with heart rates in the 30s at times that were noted during his gastric bypass surgery.  During last visit with Dr. Marlou Porch patient denied any syncope or chest pain.  He was seen in the ED at [facility] on 08/08/2021 with complaint of acute palpitations that woke him up from sleeping.  Per EMS heart rate was elevated at 150 bpm and resolved following fluid resuscitation on arrival after extensive 11-hour ER wait. He was seen by me on 08/09/2021 and was doing well with the exception of complaint of tachycardia.  I ordered 14-day ZIO monitor to evaluate current tachycardia.  I made no medication changes at that time and deferred changes based on results of ZIO monitor.   Mr. Marchiano presents today for 2-week follow-up.  During visit we discussed and reviewed findings of recent ZIO monitor.  We discussed origins of atrial fibrillation and pathophysiology.  Patient states that he has felt any sustained heart palpitations or chest pain since last visit.  Has been compliant with his current medication regimen and blood pressure today was 140/68.   He checks his blood pressure daily and states that his systolic has ranged in the 120s to 130s over the last 2 weeks.  Patient denies chest pain, palpitations, dyspnea, PND, orthopnea, nausea, vomiting, dizziness, syncope, edema, weight gain, or early satiety.  Home Medications    Current Outpatient Medications  Medication Sig Dispense Refill   amoxicillin (AMOXIL) 500 MG capsule Take 4 pills one hour prior to dental work 8 capsule 2   aspirin EC 81 MG tablet Take 81 mg by mouth daily.     atorvastatin (LIPITOR) 10 MG tablet Take 10 mg by mouth at bedtime.      Calcium Citrate-Vitamin D (CALCIUM CITRATE CHEWY BITE PO) Take 500 mg by mouth 3 (three) times daily.     ferrous sulfate 325 (65 FE) MG tablet Take 325 mg by mouth daily.      fluticasone (FLONASE) 50 MCG/ACT nasal spray Place 2 sprays into  both nostrils daily as needed for allergies.      Hypromellose (GENTEAL SEVERE OP) Place 1 drop into the right eye daily as needed (Dry eyes).     ketoconazole (NIZORAL) 2 % cream Apply 1 Application topically daily as needed for rash.     levothyroxine (SYNTHROID) 150 MCG tablet Take 150 mcg by mouth daily before breakfast.     lisinopril (ZESTRIL) 2.5 MG tablet TAKE 1 TABLET BY MOUTH EVERY DAY 90 tablet 1   metFORMIN (GLUCOPHAGE-XR) 500 MG 24 hr tablet Take 500 mg by mouth 2 (two) times daily.     Multiple Vitamin (MULTIVITAMIN WITH MINERALS) TABS tablet Take 1 tablet by mouth in the morning and at bedtime. Gummies (Patient taking differently: Take 2 tablets by mouth in the morning and at bedtime. Gummies)     nitroGLYCERIN (NITROSTAT) 0.4 MG SL tablet PLACE 1 TABLET (0.4 MG TOTAL) UNDER THE TONGUE EVERY 5 (FIVE) MINUTES X 3 DOSES AS NEEDED FOR CHEST PAIN. 25 tablet 3   Omega 3 1000 MG CAPS Take 1,000 mg by mouth daily.     Omeprazole 20 MG TBEC Take 20 mg by mouth daily as needed (acid reflux/ indigestion).      sulfamethoxazole-trimethoprim (BACTRIM DS) 800-160 MG tablet Take 1 tablet by mouth 2  (two) times daily. 30 tablet 0   Valerian Root 450 MG CAPS Take 450 mg by mouth at bedtime.     vitamin B-12 (CYANOCOBALAMIN) 1000 MCG tablet Take 1,000 mcg by mouth 3 (three) times a week. Mon, wed, Friday     No current facility-administered medications for this visit.     Review of Systems  Please see the history of present illness.     All other systems reviewed and are otherwise negative except as noted above.  Physical Exam    Wt Readings from Last 3 Encounters:  09/05/21 239 lb 6.4 oz (108.6 kg)  08/09/21 244 lb (110.7 kg)  01/07/21 244 lb (110.7 kg)   VS: Vitals:   09/05/21 1108  BP: (!) 140/60  Pulse: 64  SpO2: 97%  ,Body mass index is 26.3 kg/m.  Constitutional:      Appearance: Healthy appearance. Not in distress.  Neck:     Vascular: JVD normal.  Pulmonary:     Effort: Pulmonary effort is normal.     Breath sounds: No wheezing. No rales. Diminished in the bases Cardiovascular:     Normal rate. Regular rhythm. Normal S1. Normal S2.      Murmurs: Systolic murmur grade 2.  Edema:    Peripheral edema absent.  Abdominal:     Palpations: Abdomen is soft non tender. There is no hepatomegaly.  Skin:    General: Skin is warm and dry.  Neurological:     General: No focal deficit present.     Mental Status: Alert and oriented to person, place and time.     Cranial Nerves: Cranial nerves are intact.  EKG/LABS/Other Studies Reviewed    ECG personally reviewed by me today -none completed today  Risk Assessment/Calculations:            Lab Results  Component Value Date   WBC 6.8 08/08/2021   HGB 15.9 08/08/2021   HCT 47.2 08/08/2021   MCV 91.3 08/08/2021   PLT 197 08/08/2021   Lab Results  Component Value Date   CREATININE 0.91 08/08/2021   BUN 11 08/08/2021   NA 140 08/08/2021   K 3.6 08/08/2021   CL 106 08/08/2021  CO2 22 08/08/2021   Lab Results  Component Value Date   ALT 22 08/08/2021   AST 21 08/08/2021   ALKPHOS 65 08/08/2021    BILITOT 0.8 08/08/2021   No results found for: "CHOL", "HDL", "LDLCALC", "LDLDIRECT", "TRIG", "CHOLHDL"  Lab Results  Component Value Date   HGBA1C 6.8 (H) 03/28/2020    Assessment & Plan    1.  Palpitations: Patient was  admitted to ED via EMS after experiencing palpitations that awaken him from sleep.   -Patient reports that he has not experienced palpitations as noted during ED visit last month. -ZIO monitor was worn with no evidence of atrial fibrillation noted.  2.  Coronary artery disease: s/p DES to ramus, proximal LAD x2 with diffuse disease in distal RCA -Patient denies any anginal symptoms or equivalents during tachycardic event or today -Continue GDMT of ASA 81 mg daily, Lipitor 10 mg daily, Zestril 2.5 mg daily  3.  Asymptomatic bradycardia with RBBB and trifascicular block: -Patient denies any syncopal symptoms to suggest possible worsening conduction system  4.  Aortic stenosis/dilated aortic root: -Most recent 2D echo completed in January revealing 48 mm aortic dilation and mild aortic stenosis -Blood pressures well controlled and reviewed at length the signs and symptoms of aortic stenosis -Continue Zestril as noted above  5.  Hypertension: -Patient's blood pressure today was 140/68 -Continue Zestril as noted above  6.  Obesity: - s/p gastric bypass in 2012 maintaining normal BMI of 26.30 kg/m  Disposition: Follow-up with Candee Furbish, MD in December 2023    Medication Adjustments/Labs and Tests Ordered: Current medicines are reviewed at length with the patient today.  Concerns regarding medicines are outlined above.   Signed, Mable Fill, Marissa Nestle, NP 09/05/2021, 11:41 AM [place]

## 2021-09-01 NOTE — Progress Notes (Signed)
Office Visit Note   Patient: [name]           Date of Birth: March 03, 1950           MRN: [id number] Visit Date: 09/01/2021              Requested by: [name], MD [facility] [employer] [facility] [employer] [address],  [place] [address] PCP: [name], MD  Chief Complaint  Patient presents with   Left Foot - Wound Check    Hx TMA      HPI: Patient is a 70 year old gentleman who is status post a left transmetatarsal amputation and a right transtibial amputation.  Patient has just completed a course of doxycycline he states he still has some tenderness in the medial ankle and pain with walking at times.  Assessment & Plan: Visit Diagnoses:  1. History of transmetatarsal amputation of left foot ([place])   2. Below-knee amputation of right lower extremity ([place])     Plan: A prescription is placed for sulfamethoxazole trimethoprim.  Patient will continue routine cleansing wound care dry dressing change.  Patient denies allergies to sulfa medication.  Follow-Up Instructions: Return in about 2 weeks (around 09/15/2021).   Ortho Exam  Patient is alert, oriented, no adenopathy, well-dressed, normal affect, normal respiratory effort. Examination patient has healthy granulation tissue at the base there is some green discoloration around the edges.  After informed consent a 10 blade knife was used to debride the skin and soft tissue back to healthy viable bleeding tissue.  All green tissue was excised.  After debridement the ulcer is 2 x 4 cm and 3 mm deep.  Imaging: No results found.   Labs: Lab Results  Component Value Date   HGBA1C 6.8 (H) 03/28/2020   HGBA1C 6.5 (H) 09/26/2015   HGBA1C 7.0 (H) 06/04/2014   REPTSTATUS 08/01/2016 FINAL 07/30/2016   GRAMSTAIN  06/04/2014    MODERATE WBC PRESENT, PREDOMINANTLY PMN NO SQUAMOUS EPITHELIAL CELLS SEEN MODERATE GRAM POSITIVE COCCI IN CLUSTERS Performed at [facility]  06/04/2014    MODERATE WBC PRESENT, PREDOMINANTLY  PMN NO SQUAMOUS EPITHELIAL CELLS SEEN MODERATE GRAM POSITIVE COCCI IN PAIRS Performed at [facility]  07/30/2016    NO GROWTH Performed at [facility] [address], [place],  [id number]      Lab Results  Component Value Date   ALBUMIN 3.9 08/08/2021   ALBUMIN 3.5 03/28/2020   ALBUMIN 3.5 03/28/2020    Lab Results  Component Value Date   MG 1.8 08/08/2021   MG 2.0 04/02/2020   Lab Results  Component Value Date   VD25OH 53.03 03/28/2020    No results found for: "PREALBUMIN"    Latest Ref Rng & Units 08/08/2021    2:50 AM 04/01/2020    3:31 AM 03/31/2020    5:23 AM  CBC EXTENDED  WBC 4.0 - 10.5 K/uL 6.8  8.6  9.0   RBC 4.22 - 5.81 MIL/uL 5.17  3.92  3.98   Hemoglobin 13.0 - 17.0 g/dL 15.9  12.3  12.5   HCT 39.0 - 52.0 % 47.2  36.0  36.0   Platelets 150 - 400 K/uL 197  205  181   NEUT# 1.7 - 7.7 K/uL 4.4     Lymph# 0.7 - 4.0 K/uL 1.3        There is no height or weight on file to calculate BMI.  Orders:  No orders  of the defined types were placed in this encounter.  Meds ordered this encounter  Medications   sulfamethoxazole-trimethoprim (BACTRIM DS) 800-160 MG tablet    Sig: Take 1 tablet by mouth 2 (two) times daily.    Dispense:  30 tablet    Refill:  0     Procedures: No procedures performed  Clinical Data: No additional findings.  ROS:  All other systems negative, except as noted in the HPI. Review of Systems  Objective: Vital Signs: There were no vitals taken for this visit.  Specialty Comments:  No specialty comments available.  PMFS History: Patient Active Problem List   Diagnosis Date Noted   Hypotension 01/07/2021   S/P arthroscopy of right shoulder 06/02/2020   Closed fracture of neck of right femur ([place])    Hip fracture ([place]) 03/27/2020   Nontraumatic complete tear of right rotator cuff 02/26/2020   S/P transmetatarsal amputation of foot, left ([place]) 04/25/2016   Chronic osteomyelitis of toe, left  (HCC)    Chronic diastolic CHF (congestive heart failure) ([place]) 09/25/2015   Aortic stenosis 08/04/2014   Diabetic foot infection ([place]) 06/03/2014   Peripheral vascular disease ([place]) 06/03/2014   Coronary artery disease involving native coronary artery of native heart without angina pectoris 07/29/2013   Trifascicular block 07/29/2013   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 remote stenting to ramus, prox LAD x2.   Dental crowns present    Diabetes mellitus    First degree AV block    GERD (gastroesophageal reflux disease)    Heart attack ([place]) 06/2004   Heart murmur    aortic   Hx MRSA infection 02/2006   Hyperlipidemia    Hypertension    hx. of - has not been on med. since losing wt. after gastric bypass   Hypothyroidism    Morbid obesity ([place])    Mucoid cyst of joint 09/2011   left thumb   Sleep apnea    sleep study 03/29/2011; no CPAP use, lost >130lbs   Trifascicular block     Family History  Problem Relation Age of Onset   Heart disease Mother     Past Surgical History:  Procedure Laterality Date   AMPUTATION Left 04/19/2016   Procedure: Left Foot 4th Toe Amputation vs. Transmetatarsal;  Surgeon: [name], MD;  Location: [facility];  Service: Orthopedics;  Laterality: Left;   BIOPSY  04/07/2019   Procedure: BIOPSY;  Surgeon: Otis Brace, MD;  [facility];  Service: Gastroenterology;;   CATARACT EXTRACTION  02/2010; 03/2010   COLONOSCOPY WITH PROPOFOL N/A 09/14/2014   Procedure: COLONOSCOPY WITH PROPOFOL;  Surgeon: Garlan Fair, MD;  [facility];  Service: Endoscopy;  Laterality: N/A;   COLONOSCOPY WITH  PROPOFOL N/A 04/07/2019   Procedure: COLONOSCOPY WITH PROPOFOL;  Surgeon: Otis Brace, MD;  [facility];  Service: Gastroenterology;  Laterality: N/A;   CORONARY ANGIOPLASTY WITH STENT PLACEMENT  07/12/2004; 08/03/2004   total of 3 stents   I & D EXTREMITY Right 06/04/2014   Procedure: IRRIGATION AND DEBRIDEMENT EXTREMITY;  Surgeon: [name], MD;  Location: [facility];  Service:  Orthopedics;  Laterality: Right;   MASS EXCISION  10/04/2011   Procedure: EXCISION MASS;  Surgeon: Wynonia Sours, MD;  Location: [facility];  Service: Orthopedics;  Laterality: Left;  excision cyst debridment ip joint of left thumb   PROSTATECTOMY     right below knee amputation  2018   ROUX-EN-Y GASTRIC BYPASS  10/10/2010   laparoscopic   SHOULDER ARTHROSCOPY WITH ROTATOR CUFF REPAIR AND SUBACROMIAL DECOMPRESSION Right 03/23/2020   Procedure: RIGHT SHOULDER ARTHROSCOPY WITH DEBRIDEMENT AND  ROTATOR CUFF REPAIR;  Surgeon: [name], MD;  Location: [facility];  Service: Orthopedics;  Laterality: Right;   TOE AMPUTATION  02/23/2006   left foot second ray amputation   TOTAL HIP ARTHROPLASTY Right 03/29/2020   Procedure: TOTAL HIP ARTHROPLASTY POSTERIOR APPROACH;  Surgeon: [name], MD;  Location: [facility];  Service: Orthopedics;  Laterality: Right;   TRANSMETATARSAL AMPUTATION Left    TRIGGER FINGER RELEASE Right 09/16/2019   Procedure: RELEASE TRIGGER FINGER/A-1 PULLEY;  Surgeon: Daryll Brod, MD;  Location: [facility];  Service: Orthopedics;  Laterality: Right;  IV REGIONAL FOREARM BLOCK   Social History   Occupational History   Occupation: Engineer, maintenance (IT) taxes  Tobacco Use   Smoking status: Never   Smokeless tobacco: Never  Substance and Sexual Activity   Alcohol use: No   Drug use: No   Sexual activity: Yes

## 2021-09-05 ENCOUNTER — Ambulatory Visit (INDEPENDENT_AMBULATORY_CARE_PROVIDER_SITE_OTHER): Payer: Medicare Other | Admitting: Nurse Practitioner

## 2021-09-05 ENCOUNTER — Encounter: Payer: Self-pay | Admitting: Nurse Practitioner

## 2021-09-05 VITALS — BP 140/60 | HR 64 | Ht >= 80 in | Wt 239.4 lb

## 2021-09-05 DIAGNOSIS — I10 Essential (primary) hypertension: Secondary | ICD-10-CM

## 2021-09-05 DIAGNOSIS — I453 Trifascicular block: Secondary | ICD-10-CM | POA: Diagnosis not present

## 2021-09-05 DIAGNOSIS — E785 Hyperlipidemia, unspecified: Secondary | ICD-10-CM

## 2021-09-05 DIAGNOSIS — I77810 Thoracic aortic ectasia: Secondary | ICD-10-CM | POA: Diagnosis not present

## 2021-09-05 DIAGNOSIS — I452 Bifascicular block: Secondary | ICD-10-CM

## 2021-09-05 DIAGNOSIS — R002 Palpitations: Secondary | ICD-10-CM

## 2021-09-05 NOTE — Patient Instructions (Signed)
Medication Instructions:  Your physician recommends that you continue on your current medications as directed. Please refer to the Current Medication list given to you today.   *If you need a refill on your cardiac medications before your next appointment, please call your pharmacy*   Lab Work: None ordered   If you have labs (blood work) drawn today and your tests are completely normal, you will receive your results only by: [facility] (if you have MyChart) OR A paper copy in the mail If you have any lab test that is abnormal or we need to change your treatment, we will call you to review the results.   Testing/Procedures: None ordered    Follow-Up: Follow up as scheduled    Other Instructions  Atrial Fibrillation  Atrial fibrillation is a type of irregular or rapid heartbeat (arrhythmia). In atrial fibrillation, the top part of the heart (atria) beats in an irregular pattern. This makes the heart unable to pump blood normally and effectively. The goal of treatment is to prevent blood clots from forming, control your heart rate, or restore your heartbeat to a normal rhythm. If this condition is not treated, it can cause serious problems, such as a weakened heart muscle (cardiomyopathy) or a stroke. What are the causes? This condition is often caused by medical conditions that damage the heart's electrical system. These include: High blood pressure (hypertension). This is the most common cause. Certain heart problems or conditions, such as heart failure, coronary artery disease, heart valve problems, or heart surgery. Diabetes. Overactive thyroid (hyperthyroidism). Obesity. Chronic kidney disease. In some cases, the cause of this condition is not known. What increases the risk? This condition is more likely to develop in: Older people. People who smoke. Athletes who do endurance exercise. People who have a family history of atrial fibrillation. Men. People who use  drugs. People who drink a lot of alcohol. People who have lung conditions, such as emphysema, pneumonia, or COPD. People who have obstructive sleep apnea. What are the signs or symptoms? Symptoms of this condition include: A feeling that your heart is racing or beating irregularly. Discomfort or pain in your chest. Shortness of breath. Sudden light-headedness or weakness. Tiring easily during exercise or activity. Fatigue. Syncope (fainting). Sweating. In some cases, there are no symptoms. How is this diagnosed? Your health care provider may detect atrial fibrillation when taking your pulse. If detected, this condition may be diagnosed with: An electrocardiogram (ECG) to check electrical signals of the heart. An ambulatory cardiac monitor to record your heart's activity for a few days. A transthoracic echocardiogram (TTE) to create pictures of your heart. A transesophageal echocardiogram (TEE) to create even closer pictures of your heart. A stress test to check your blood supply while you exercise. Imaging tests, such as a CT scan or chest X-ray. Blood tests. How is this treated? Treatment depends on underlying conditions and how you feel when you experience atrial fibrillation. This condition may be treated with: Medicines to prevent blood clots or to treat heart rate or heart rhythm problems. Electrical cardioversion to reset the heart's rhythm. A pacemaker to correct abnormal heart rhythm. Ablation to remove the heart tissue that sends abnormal signals. Left atrial appendage closure to seal the area where blood clots can form. In some cases, underlying conditions will be treated. Follow these instructions at home: Medicines Take over-the counter and prescription medicines only as told by your health care provider. Do not take any new medicines without talking to your health care  provider. If you are taking blood thinners: Talk with your health care provider before you take any  medicines that contain aspirin or NSAIDs, such as ibuprofen. These medicines increase your risk for dangerous bleeding. Take your medicine exactly as told, at the same time every day. Avoid activities that could cause injury or bruising, and follow instructions about how to prevent falls. Wear a medical alert bracelet or carry a card that lists what medicines you take. Lifestyle     Do not use any products that contain nicotine or tobacco, such as cigarettes, e-cigarettes, and chewing tobacco. If you need help quitting, ask your health care provider. Eat heart-healthy foods. Talk with a dietitian to make an eating plan that is right for you. Exercise regularly as told by your health care provider. Do not drink alcohol. Lose weight if you are overweight. Do not use drugs, including cannabis. General instructions If you have obstructive sleep apnea, manage your condition as told by your health care provider. Do not use diet pills unless your health care provider approves. Diet pills can make heart problems worse. Keep all follow-up visits as told by your health care provider. This is important. Contact a health care provider if you: Notice a change in the rate, rhythm, or strength of your heartbeat. Are taking a blood thinner and you notice more bruising. Tire more easily when you exercise or do heavy work. Have a sudden change in weight. Get help right away if you have:  Chest pain, abdominal pain, sweating, or weakness. Trouble breathing. Side effects of blood thinners, such as blood in your vomit, stool, or urine, or bleeding that cannot stop. Any symptoms of a stroke. "BE FAST" is an easy way to remember the main warning signs of a stroke: B - Balance. Signs are dizziness, sudden trouble walking, or loss of balance. E - Eyes. Signs are trouble seeing or a sudden change in vision. F - Face. Signs are sudden weakness or numbness of the face, or the face or eyelid drooping on one  side. A - Arms. Signs are weakness or numbness in an arm. This happens suddenly and usually on one side of the body. S - Speech. Signs are sudden trouble speaking, slurred speech, or trouble understanding what people say. T - Time. Time to call emergency services. Write down what time symptoms started. Other signs of a stroke, such as: A sudden, severe headache with no known cause. Nausea or vomiting. Seizure. These symptoms may represent a serious problem that is an emergency. Do not wait to see if the symptoms will go away. Get medical help right away. Call your local emergency services (911 in the [place] Do not drive yourself to the hospital. Summary Atrial fibrillation is a type of irregular or rapid heartbeat (arrhythmia). Symptoms include a feeling that your heart is beating fast or irregularly. You may be given medicines to prevent blood clots or to treat heart rate or heart rhythm problems. Get help right away if you have signs or symptoms of a stroke. Get help right away if you cannot catch your breath or have chest pain or pressure. This information is not intended to replace advice given to you by your health care provider. Make sure you discuss any questions you have with your health care provider. Document Revised: 07/03/2018 Document Reviewed: 07/03/2018 Elsevier Patient Education  [place].

## 2021-09-13 ENCOUNTER — Ambulatory Visit: Payer: Medicare Other | Admitting: Orthopedic Surgery

## 2021-09-15 ENCOUNTER — Ambulatory Visit (INDEPENDENT_AMBULATORY_CARE_PROVIDER_SITE_OTHER): Payer: Medicare Other | Admitting: Orthopedic Surgery

## 2021-09-15 DIAGNOSIS — Z89511 Acquired absence of right leg below knee: Secondary | ICD-10-CM | POA: Diagnosis not present

## 2021-09-15 DIAGNOSIS — I2510 Atherosclerotic heart disease of native coronary artery without angina pectoris: Secondary | ICD-10-CM

## 2021-09-15 DIAGNOSIS — Z89432 Acquired absence of left foot: Secondary | ICD-10-CM | POA: Diagnosis not present

## 2021-09-15 DIAGNOSIS — S88111A Complete traumatic amputation at level between knee and ankle, right lower leg, initial encounter: Secondary | ICD-10-CM

## 2021-09-20 ENCOUNTER — Encounter: Payer: Self-pay | Admitting: Orthopedic Surgery

## 2021-09-20 DIAGNOSIS — L309 Dermatitis, unspecified: Secondary | ICD-10-CM | POA: Diagnosis not present

## 2021-09-20 NOTE — Progress Notes (Signed)
Office Visit Note   Patient: [name]           Date of Birth: 11/16/1950           MRN: [id number] Visit Date: 09/15/2021              Requested by: [name], MD [facility] [employer] [address],  [place] [address] PCP: [name], MD  Chief Complaint  Patient presents with   Right Leg - Follow-up    Hx right BKA 2018   Left Foot - Follow-up    Hx transmet amp       HPI: Patient is a 71 year old gentleman who is status post right transtibial amputation and a left transmetatarsal amputation.  Patient is completing a course of Bactrim DS today.  Patient states he has had a small amount of drainage and 2 small open wounds on the left foot.  Assessment & Plan: Visit Diagnoses:  1. Below-knee amputation of right lower extremity ([place])   2. History of transmetatarsal amputation of left foot ([place])     Plan: Ulcer debrided left foot continue wound care pressure offloading.  Follow-Up Instructions: Return in about 2 weeks (around 09/29/2021).   Ortho Exam  Patient is alert, oriented, no adenopathy, well-dressed, normal affect, normal respiratory effort. Examination patient has a stable right transtibial amputation.  Left foot he has a Wagner grade 1 ulcer.  There is no ascending cellulitis no purulent drainage.  After informed consent a 10 blade knife was used to debride the skin and soft tissue back to healthy viable tissue.  After debridement the ulcer is 2 x 4 cm and 2 mm deep.  There is no exposed bone or tendon no undermining.  Imaging: No results found. No images are attached to the encounter.  Labs: Lab Results  Component Value Date   HGBA1C 6.8 (H) 03/28/2020   HGBA1C 6.5 (H) 09/26/2015   HGBA1C 7.0 (H) 06/04/2014   REPTSTATUS 08/01/2016 FINAL 07/30/2016   GRAMSTAIN  06/04/2014    MODERATE WBC PRESENT, PREDOMINANTLY PMN NO SQUAMOUS EPITHELIAL CELLS SEEN MODERATE GRAM POSITIVE COCCI IN CLUSTERS Performed at [facility]  06/04/2014     MODERATE WBC PRESENT, PREDOMINANTLY PMN NO SQUAMOUS EPITHELIAL CELLS SEEN MODERATE GRAM POSITIVE COCCI IN PAIRS Performed at [facility]  07/30/2016    NO GROWTH Performed at [facility] [address], [place],  [address]      Lab Results  Component Value Date   ALBUMIN 3.9 08/08/2021   ALBUMIN 3.5 03/28/2020   ALBUMIN 3.5 03/28/2020    Lab Results  Component Value Date   MG 1.8 08/08/2021   MG 2.0 04/02/2020   Lab Results  Component Value Date   VD25OH 53.03 03/28/2020    No results found for: "PREALBUMIN"    Latest Ref Rng & Units 08/08/2021    2:50 AM 04/01/2020    3:31 AM 03/31/2020    5:23 AM  CBC EXTENDED  WBC 4.0 - 10.5 K/uL 6.8  8.6  9.0   RBC 4.22 - 5.81 MIL/uL 5.17  3.92  3.98   Hemoglobin 13.0 - 17.0 g/dL 15.9  12.3  12.5   HCT 39.0 - 52.0 % 47.2  36.0  36.0   Platelets 150 - 400 K/uL 197  205  181   NEUT# 1.7 - 7.7 K/uL 4.4     Lymph# 0.7 - 4.0 K/uL 1.3  There is no height or weight on file to calculate BMI.  Orders:  No orders of the defined types were placed in this encounter.  No orders of the defined types were placed in this encounter.    Procedures: No procedures performed  Clinical Data: No additional findings.  ROS:  All other systems negative, except as noted in the HPI. Review of Systems  Objective: Vital Signs: There were no vitals taken for this visit.  Specialty Comments:  No specialty comments available.  PMFS History: Patient Active Problem List   Diagnosis Date Noted   Hypotension 01/07/2021   S/P arthroscopy of right shoulder 06/02/2020   Closed fracture of neck of right femur ([place])    Hip fracture ([place]) 03/27/2020   Nontraumatic complete tear of right rotator cuff 02/26/2020   S/P transmetatarsal amputation of foot, left ([place]) 04/25/2016   Chronic osteomyelitis of toe, left ([facility])    Chronic diastolic CHF (congestive heart failure) ([place]) 09/25/2015   Aortic stenosis  08/04/2014   Diabetic foot infection ([place]) 06/03/2014   Peripheral vascular disease ([place]) 06/03/2014   Coronary artery disease involving native coronary artery of native heart without angina pectoris 07/29/2013   Trifascicular block 07/29/2013   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 remote stenting to ramus, prox LAD x2.   Dental crowns present    Diabetes mellitus    First degree AV block    GERD (gastroesophageal reflux disease)    Heart attack ([place]) 06/2004   Heart murmur    aortic   Hx MRSA infection 02/2006   Hyperlipidemia    Hypertension    hx. of - has not been on med. since losing wt. after gastric bypass   Hypothyroidism    Morbid obesity ([place])    Mucoid cyst of joint 09/2011   left thumb   Sleep apnea    sleep study 03/29/2011; no CPAP use, lost >130lbs   Trifascicular block     Family History  Problem Relation Age of Onset   Heart disease Mother     Past Surgical History:  Procedure Laterality Date   AMPUTATION Left 04/19/2016   Procedure: Left Foot 4th Toe Amputation vs. Transmetatarsal;  Surgeon: [name], MD;  Location: [facility];  Service: Orthopedics;  Laterality: Left;   BIOPSY  04/07/2019   Procedure: BIOPSY;  Surgeon: Otis Brace, MD;  [facility];  Service: Gastroenterology;;   CATARACT EXTRACTION  02/2010; 03/2010   COLONOSCOPY WITH PROPOFOL N/A 09/14/2014   Procedure: COLONOSCOPY WITH PROPOFOL;  Surgeon: Garlan Fair, MD;  [facility];  Service: Endoscopy;  Laterality: N/A;   COLONOSCOPY WITH PROPOFOL N/A 04/07/2019   Procedure: COLONOSCOPY WITH PROPOFOL;  Surgeon: Otis Brace, MD;   [facility];  Service: Gastroenterology;  Laterality: N/A;   CORONARY ANGIOPLASTY WITH STENT PLACEMENT  07/12/2004; 08/03/2004   total of 3 stents   I & D EXTREMITY Right 06/04/2014   Procedure: IRRIGATION AND DEBRIDEMENT EXTREMITY;  Surgeon: [name], MD;  Location: [facility];  Service: Orthopedics;  Laterality: Right;   MASS EXCISION  10/04/2011   Procedure: EXCISION  MASS;  Surgeon: Wynonia Sours, MD;  Location: [facility];  Service: Orthopedics;  Laterality: Left;  excision cyst debridment ip joint of left thumb   PROSTATECTOMY     right below knee amputation  2018   ROUX-EN-Y GASTRIC BYPASS  10/10/2010   laparoscopic   SHOULDER ARTHROSCOPY WITH ROTATOR CUFF REPAIR AND SUBACROMIAL DECOMPRESSION Right 03/23/2020   Procedure: RIGHT SHOULDER ARTHROSCOPY WITH DEBRIDEMENT AND  ROTATOR CUFF REPAIR;  Surgeon: [name], MD;  Location: [facility];  Service: Orthopedics;  Laterality: Right;   TOE AMPUTATION  02/23/2006   left foot second ray amputation   TOTAL HIP ARTHROPLASTY Right 03/29/2020   Procedure: TOTAL HIP ARTHROPLASTY POSTERIOR APPROACH;  Surgeon: [name], MD;  Location: [facility];  Service: Orthopedics;  Laterality: Right;   TRANSMETATARSAL AMPUTATION Left    TRIGGER FINGER RELEASE Right 09/16/2019   Procedure: RELEASE TRIGGER FINGER/A-1 PULLEY;  Surgeon: Daryll Brod, MD;  Location: [facility];  Service: Orthopedics;  Laterality: Right;  IV REGIONAL FOREARM BLOCK   Social History   Occupational History   Occupation: Engineer, maintenance (IT) taxes  Tobacco Use   Smoking status: Never   Smokeless tobacco: Never  Substance and Sexual Activity   Alcohol use: No   Drug use: No   Sexual activity: Yes

## 2021-10-03 ENCOUNTER — Encounter: Payer: Self-pay | Admitting: Orthopedic Surgery

## 2021-10-03 ENCOUNTER — Ambulatory Visit (INDEPENDENT_AMBULATORY_CARE_PROVIDER_SITE_OTHER): Payer: Medicare Other | Admitting: Orthopedic Surgery

## 2021-10-03 DIAGNOSIS — L97521 Non-pressure chronic ulcer of other part of left foot limited to breakdown of skin: Secondary | ICD-10-CM | POA: Diagnosis not present

## 2021-10-03 DIAGNOSIS — I2510 Atherosclerotic heart disease of native coronary artery without angina pectoris: Secondary | ICD-10-CM | POA: Diagnosis not present

## 2021-10-03 DIAGNOSIS — Z89511 Acquired absence of right leg below knee: Secondary | ICD-10-CM

## 2021-10-03 DIAGNOSIS — Z89432 Acquired absence of left foot: Secondary | ICD-10-CM

## 2021-10-03 DIAGNOSIS — S88111A Complete traumatic amputation at level between knee and ankle, right lower leg, initial encounter: Secondary | ICD-10-CM

## 2021-10-03 NOTE — Progress Notes (Signed)
Office Visit Note   Patient: [name]           Date of Birth: March 18, 1950           MRN: [id number] Visit Date: 10/03/2021              Requested by: [name], MD [place] [employer] [place] [address],  [place] [address] PCP: [name], MD  Chief Complaint  Patient presents with   Left Foot - Wound Check    Left transmet amputation ulcer f/u      HPI: Patient is a 71 year old gentleman who presents in follow-up for ulcer left transmetatarsal amputation.  Patient is continuing wound care and pressure offloading.  Assessment & Plan: Visit Diagnoses:  1. Below-knee amputation of right lower extremity ([place])   2. History of transmetatarsal amputation of left foot ([place])   3. Non-pressure chronic ulcer of other part of left foot limited to breakdown of skin ([place])     Plan: Ulcer was debrided wound has healthy viable granulation tissue.  Continue with his current treatment.  Follow-Up Instructions: Return in about 4 weeks (around 10/31/2021).   Ortho Exam  Patient is alert, oriented, no adenopathy, well-dressed, normal affect, normal respiratory effort. Examination of the left foot there is no redness no cellulitis no odor or drainage.  Patient has a ulcer over the residual limb left transmetatarsal amputation.  After informed consent a 10 blade knife was used to debride the skin and soft tissue back to healthy viable granulation tissue this was touched with silver nitrate after debridement the ulcer is 1 x 3 cm and 1 mm deep.  Imaging: No results found.   Labs: Lab Results  Component Value Date   HGBA1C 6.8 (H) 03/28/2020   HGBA1C 6.5 (H) 09/26/2015   HGBA1C 7.0 (H) 06/04/2014   REPTSTATUS 08/01/2016 FINAL 07/30/2016   GRAMSTAIN  06/04/2014    MODERATE WBC PRESENT, PREDOMINANTLY PMN NO SQUAMOUS EPITHELIAL CELLS SEEN MODERATE GRAM POSITIVE COCCI IN CLUSTERS Performed at [facility]  06/04/2014    MODERATE WBC PRESENT, PREDOMINANTLY PMN NO  SQUAMOUS EPITHELIAL CELLS SEEN MODERATE GRAM POSITIVE COCCI IN PAIRS Performed at [facility]  07/30/2016    NO GROWTH Performed at [facility] [address], [place], [place] [address]      Lab Results  Component Value Date   ALBUMIN 3.9 08/08/2021   ALBUMIN 3.5 03/28/2020   ALBUMIN 3.5 03/28/2020    Lab Results  Component Value Date   MG 1.8 08/08/2021   MG 2.0 04/02/2020   Lab Results  Component Value Date   VD25OH 53.03 03/28/2020    No results found for: "PREALBUMIN"    Latest Ref Rng & Units 08/08/2021    2:50 AM 04/01/2020    3:31 AM 03/31/2020    5:23 AM  CBC EXTENDED  WBC 4.0 - 10.5 K/uL 6.8  8.6  9.0   RBC 4.22 - 5.81 MIL/uL 5.17  3.92  3.98   Hemoglobin 13.0 - 17.0 g/dL 15.9  12.3  12.5   HCT 39.0 - 52.0 % 47.2  36.0  36.0   Platelets 150 - 400 K/uL 197  205  181   NEUT# 1.7 - 7.7 K/uL 4.4     Lymph# 0.7 - 4.0 K/uL 1.3        There is no height or weight on file to calculate BMI.  Orders:  No orders of  the defined types were placed in this encounter.  No orders of the defined types were placed in this encounter.    Procedures: No procedures performed  Clinical Data: No additional findings.  ROS:  All other systems negative, except as noted in the HPI. Review of Systems  Objective: Vital Signs: There were no vitals taken for this visit.  Specialty Comments:  No specialty comments available.  PMFS History: Patient Active Problem List   Diagnosis Date Noted   Hypotension 01/07/2021   S/P arthroscopy of right shoulder 06/02/2020   Closed fracture of neck of right femur ([place])    Hip fracture ([place]) 03/27/2020   Nontraumatic complete tear of right rotator cuff 02/26/2020   S/P transmetatarsal amputation of foot, left ([place]) 04/25/2016   Chronic osteomyelitis of toe, left (HCC)    Chronic diastolic CHF (congestive heart failure) ([place]) 09/25/2015   Aortic stenosis 08/04/2014   Diabetic foot infection ([place])  06/03/2014   Peripheral vascular disease ([place]) 06/03/2014   Coronary artery disease involving native coronary artery of native heart without angina pectoris 07/29/2013   Trifascicular block 07/29/2013   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 07/26/2011   Hypothyroidism 01/22/2007   Type 2 diabetes mellitus with vascular disease ([place]) 01/22/2007   HYPERLIPIDEMIA, MIXED 01/22/2007   MYOCARDIAL INFARCTION, HX OF 01/22/2007   ANEMIA, IRON DEFICIENCY, HX OF 01/22/2007   Past Medical History:  Diagnosis Date   Anemia    low iron   Arthritis    Coronary artery disease 2006   a. remote stenting to ramus, prox LAD x2.   Dental crowns present    Diabetes mellitus    First degree AV block    GERD (gastroesophageal reflux disease)    Heart attack ([place]) 06/2004   Heart murmur    aortic   Hx MRSA infection 02/2006   Hyperlipidemia    Hypertension    hx. of - has not been on med. since losing wt. after gastric bypass   Hypothyroidism    Morbid obesity ([place])    Mucoid cyst of joint 09/2011   left thumb   Sleep apnea    sleep study 03/29/2011; no CPAP use, lost >130lbs   Trifascicular block     Family History  Problem Relation Age of Onset   Heart disease Mother     Past Surgical History:  Procedure Laterality Date   AMPUTATION Left 04/19/2016   Procedure: Left Foot 4th Toe Amputation vs. Transmetatarsal;  Surgeon: [name], MD;  Location: [facility];  Service: Orthopedics;  Laterality: Left;   BIOPSY  04/07/2019   Procedure: BIOPSY;  Surgeon: Otis Brace, MD;  [facility];  Service: Gastroenterology;;   CATARACT EXTRACTION  02/2010; 03/2010   COLONOSCOPY WITH PROPOFOL N/A 09/14/2014   Procedure: COLONOSCOPY WITH PROPOFOL;  Surgeon: Garlan Fair, MD;  [facility];  Service: Endoscopy;  Laterality: N/A;   COLONOSCOPY WITH PROPOFOL N/A 04/07/2019   Procedure: COLONOSCOPY WITH PROPOFOL;  Surgeon: Otis Brace, MD;  [facility];  Service:  Gastroenterology;  Laterality: N/A;   CORONARY ANGIOPLASTY WITH STENT PLACEMENT  07/12/2004; 08/03/2004   total of 3 stents   I & D EXTREMITY Right 06/04/2014   Procedure: IRRIGATION AND DEBRIDEMENT EXTREMITY;  Surgeon: [name], MD;  Location: [facility];  Service: Orthopedics;  Laterality: Right;   MASS EXCISION  10/04/2011   Procedure: EXCISION MASS;  Surgeon: Wynonia Sours, MD;  Location: [facility];  Service: Orthopedics;  Laterality: Left;  excision cyst debridment ip joint of left thumb   PROSTATECTOMY     right below knee amputation  2018   ROUX-EN-Y GASTRIC BYPASS  10/10/2010   laparoscopic   SHOULDER ARTHROSCOPY WITH ROTATOR CUFF REPAIR AND SUBACROMIAL DECOMPRESSION Right 03/23/2020   Procedure: RIGHT SHOULDER ARTHROSCOPY WITH DEBRIDEMENT AND  ROTATOR CUFF REPAIR;  Surgeon: [name], MD;  Location: [facility];  Service: Orthopedics;  Laterality: Right;   TOE AMPUTATION  02/23/2006   left foot second ray amputation   TOTAL HIP ARTHROPLASTY Right 03/29/2020   Procedure: TOTAL HIP ARTHROPLASTY POSTERIOR APPROACH;  Surgeon: [name], MD;  Location: [facility];  Service: Orthopedics;  Laterality: Right;   TRANSMETATARSAL AMPUTATION Left    TRIGGER FINGER RELEASE Right 09/16/2019   Procedure: RELEASE TRIGGER FINGER/A-1 PULLEY;  Surgeon: Daryll Brod, MD;  Location: [facility];  Service: Orthopedics;  Laterality: Right;  IV REGIONAL FOREARM BLOCK   Social History   Occupational History   Occupation: Engineer, maintenance (IT) taxes  Tobacco Use   Smoking status: Never   Smokeless tobacco: Never  Substance and Sexual Activity   Alcohol use: No   Drug use: No   Sexual activity: Yes

## 2021-10-17 DIAGNOSIS — Z23 Encounter for immunization: Secondary | ICD-10-CM | POA: Diagnosis not present

## 2021-10-21 DIAGNOSIS — Z23 Encounter for immunization: Secondary | ICD-10-CM | POA: Diagnosis not present

## 2021-10-31 ENCOUNTER — Ambulatory Visit (INDEPENDENT_AMBULATORY_CARE_PROVIDER_SITE_OTHER): Payer: Medicare Other | Admitting: Orthopedic Surgery

## 2021-10-31 DIAGNOSIS — I2510 Atherosclerotic heart disease of native coronary artery without angina pectoris: Secondary | ICD-10-CM | POA: Diagnosis not present

## 2021-10-31 DIAGNOSIS — Z89511 Acquired absence of right leg below knee: Secondary | ICD-10-CM | POA: Diagnosis not present

## 2021-10-31 DIAGNOSIS — Z89432 Acquired absence of left foot: Secondary | ICD-10-CM

## 2021-10-31 DIAGNOSIS — L97521 Non-pressure chronic ulcer of other part of left foot limited to breakdown of skin: Secondary | ICD-10-CM

## 2021-10-31 DIAGNOSIS — S88111A Complete traumatic amputation at level between knee and ankle, right lower leg, initial encounter: Secondary | ICD-10-CM

## 2021-11-01 ENCOUNTER — Encounter: Payer: Self-pay | Admitting: Orthopedic Surgery

## 2021-11-01 NOTE — Progress Notes (Signed)
Office Visit Note   Patient: [name]           Date of Birth: 10/20/1950           MRN: [id number] Visit Date: 10/31/2021              Requested by: [name], MD No address on file PCP: [name], MD  Chief Complaint  Patient presents with   Left Foot - Wound Check      HPI: Patient is a 71 year old gentleman is status post right transtibial amputation and left transmetatarsal amputation.  Patient has had episodes of recurrent ulceration left foot he does have new orthotics and spacer.  Assessment & Plan: Visit Diagnoses:  1. Below-knee amputation of right lower extremity ([place])   2. History of transmetatarsal amputation of left foot ([place])   3. Non-pressure chronic ulcer of other part of left foot limited to breakdown of skin ([place])     Plan: A felt relieving donut was cut out to put underneath his custom orthotic.  The ulceration may be secondary to the increased density of his new orthotics.  Follow-Up Instructions: Return in about 4 weeks (around 11/28/2021).   Ortho Exam  Patient is alert, oriented, no adenopathy, well-dressed, normal affect, normal respiratory effort. Examination patient has well fitting orthotics and spacer with a carbon plate.  He has an ulcer beneath the forefoot of the transmetatarsal amputation.  After informed consent a 10 blade knife was used debride the skin and soft tissue back to healthy viable granulation tissue.  Silver nitrate was used for hemostasis.  After debridement the ulcer is 2 x 3 cm and 3 mm deep.  Patient appears to have increased pressure across the forefoot secondary to the increased density.  We will need to place a felt relieving donut on the plantar aspect of the orthotic.  Patient has good dorsiflexion of the ankle no equinus contracture.  Imaging: No results found. No images are attached to the encounter.  Labs: Lab Results  Component Value Date   HGBA1C 6.8 (H) 03/28/2020   HGBA1C 6.5 (H) 09/26/2015   HGBA1C  7.0 (H) 06/04/2014   REPTSTATUS 08/01/2016 FINAL 07/30/2016   GRAMSTAIN  06/04/2014    MODERATE WBC PRESENT, PREDOMINANTLY PMN NO SQUAMOUS EPITHELIAL CELLS SEEN MODERATE GRAM POSITIVE COCCI IN CLUSTERS Performed at [facility]  06/04/2014    MODERATE WBC PRESENT, PREDOMINANTLY PMN NO SQUAMOUS EPITHELIAL CELLS SEEN MODERATE GRAM POSITIVE COCCI IN PAIRS Performed at [facility]  07/30/2016    NO GROWTH Performed at [facility] [address] [place], [place] [address]      Lab Results  Component Value Date   ALBUMIN 3.9 08/08/2021   ALBUMIN 3.5 03/28/2020   ALBUMIN 3.5 03/28/2020    Lab Results  Component Value Date   MG 1.8 08/08/2021   MG 2.0 04/02/2020   Lab Results  Component Value Date   VD25OH 53.03 03/28/2020    No results found for: "PREALBUMIN"    Latest Ref Rng & Units 08/08/2021    2:50 AM 04/01/2020    3:31 AM 03/31/2020    5:23 AM  CBC EXTENDED  WBC 4.0 - 10.5 K/uL 6.8  8.6  9.0   RBC 4.22 - 5.81 MIL/uL 5.17  3.92  3.98   Hemoglobin 13.0 - 17.0 g/dL 15.9  12.3  12.5   HCT 39.0 - 52.0 % 47.2  36.0  36.0  Platelets 150 - 400 K/uL 197  205  181   NEUT# 1.7 - 7.7 K/uL 4.4     Lymph# 0.7 - 4.0 K/uL 1.3        There is no height or weight on file to calculate BMI.  Orders:  No orders of the defined types were placed in this encounter.  No orders of the defined types were placed in this encounter.    Procedures: No procedures performed  Clinical Data: No additional findings.  ROS:  All other systems negative, except as noted in the HPI. Review of Systems  Objective: Vital Signs: There were no vitals taken for this visit.  Specialty Comments:  No specialty comments available.  PMFS History: Patient Active Problem List   Diagnosis Date Noted   Hypotension 01/07/2021   S/P arthroscopy of right shoulder 06/02/2020   Closed fracture of neck of right femur ([place])    Hip fracture ([place])  03/27/2020   Nontraumatic complete tear of right rotator cuff 02/26/2020   S/P transmetatarsal amputation of foot, left ([place]) 04/25/2016   Chronic osteomyelitis of toe, left (HCC)    Chronic diastolic CHF (congestive heart failure) ([place]) 09/25/2015   Aortic stenosis 08/04/2014   Diabetic foot infection ([place]) 06/03/2014   Peripheral vascular disease ([place]) 06/03/2014   Coronary artery disease involving native coronary artery of native heart without angina pectoris 07/29/2013   Trifascicular block 07/29/2013   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 remote stenting to ramus, prox LAD x2.   Dental crowns present    Diabetes mellitus    First degree AV block    GERD (gastroesophageal reflux disease)    Heart attack ([place]) 06/2004   Heart murmur    aortic   Hx MRSA infection 02/2006   Hyperlipidemia    Hypertension    hx. of - has not been on med. since losing wt. after gastric bypass   Hypothyroidism    Morbid obesity ([place])    Mucoid cyst of joint 09/2011   left thumb   Sleep apnea    sleep study 03/29/2011; no CPAP use, lost >130lbs   Trifascicular block     Family History  Problem Relation Age of Onset   Heart disease Mother     Past Surgical History:  Procedure Laterality Date   AMPUTATION Left 04/19/2016   Procedure: Left Foot 4th Toe Amputation vs. Transmetatarsal;  Surgeon: [name], MD;  Location: [facility];  Service: Orthopedics;  Laterality: Left;   BIOPSY  04/07/2019   Procedure: BIOPSY;  Surgeon: Otis Brace, MD;  [facility];  Service: Gastroenterology;;   CATARACT EXTRACTION  02/2010; 03/2010   COLONOSCOPY WITH  PROPOFOL N/A 09/14/2014   Procedure: COLONOSCOPY WITH PROPOFOL;  Surgeon: Garlan Fair, MD;  [facility];  Service: Endoscopy;  Laterality: N/A;   COLONOSCOPY WITH PROPOFOL N/A 04/07/2019   Procedure: COLONOSCOPY WITH PROPOFOL;  Surgeon: Otis Brace, MD;  [facility];  Service: Gastroenterology;  Laterality: N/A;   CORONARY ANGIOPLASTY WITH STENT PLACEMENT  07/12/2004; 08/03/2004   total of 3 stents   I & D EXTREMITY  Right 06/04/2014   Procedure: IRRIGATION AND DEBRIDEMENT EXTREMITY;  Surgeon: [name], MD;  Location: [facility];  Service: Orthopedics;  Laterality: Right;   MASS EXCISION  10/04/2011   Procedure: EXCISION MASS;  Surgeon: Wynonia Sours, MD;  Location: [facility];  Service: Orthopedics;  Laterality: Left;  excision cyst debridment ip joint of left thumb   PROSTATECTOMY     right below knee amputation  2018   ROUX-EN-Y GASTRIC BYPASS  10/10/2010   laparoscopic   SHOULDER ARTHROSCOPY WITH ROTATOR CUFF REPAIR AND SUBACROMIAL DECOMPRESSION Right 03/23/2020   Procedure: RIGHT SHOULDER ARTHROSCOPY WITH DEBRIDEMENT AND  ROTATOR CUFF REPAIR;  Surgeon: [name], MD;  Location: [facility];  Service: Orthopedics;  Laterality: Right;   TOE AMPUTATION  02/23/2006   left foot second ray amputation   TOTAL HIP ARTHROPLASTY Right 03/29/2020   Procedure: TOTAL HIP ARTHROPLASTY POSTERIOR APPROACH;  Surgeon: [name], MD;  Location: [facility];  Service: Orthopedics;  Laterality: Right;   TRANSMETATARSAL AMPUTATION Left    TRIGGER FINGER RELEASE Right 09/16/2019   Procedure: RELEASE TRIGGER FINGER/A-1 PULLEY;  Surgeon: Daryll Brod, MD;  Location: [facility];  Service: Orthopedics;  Laterality: Right;  IV REGIONAL FOREARM BLOCK   Social History   Occupational History   Occupation: Engineer, maintenance (IT) taxes  Tobacco Use   Smoking status: Never   Smokeless tobacco: Never  Substance and Sexual Activity   Alcohol use: No   Drug use: No    Sexual activity: Yes

## 2021-11-18 DIAGNOSIS — D2372 Other benign neoplasm of skin of left lower limb, including hip: Secondary | ICD-10-CM | POA: Diagnosis not present

## 2021-11-18 DIAGNOSIS — D2362 Other benign neoplasm of skin of left upper limb, including shoulder: Secondary | ICD-10-CM | POA: Diagnosis not present

## 2021-11-18 DIAGNOSIS — D225 Melanocytic nevi of trunk: Secondary | ICD-10-CM | POA: Diagnosis not present

## 2021-11-18 DIAGNOSIS — D692 Other nonthrombocytopenic purpura: Secondary | ICD-10-CM | POA: Diagnosis not present

## 2021-11-18 DIAGNOSIS — D1721 Benign lipomatous neoplasm of skin and subcutaneous tissue of right arm: Secondary | ICD-10-CM | POA: Diagnosis not present

## 2021-11-18 DIAGNOSIS — L300 Nummular dermatitis: Secondary | ICD-10-CM | POA: Diagnosis not present

## 2021-11-18 DIAGNOSIS — L821 Other seborrheic keratosis: Secondary | ICD-10-CM | POA: Diagnosis not present

## 2021-11-18 DIAGNOSIS — D1801 Hemangioma of skin and subcutaneous tissue: Secondary | ICD-10-CM | POA: Diagnosis not present

## 2021-11-24 DIAGNOSIS — E1149 Type 2 diabetes mellitus with other diabetic neurological complication: Secondary | ICD-10-CM | POA: Diagnosis not present

## 2021-11-27 ENCOUNTER — Other Ambulatory Visit: Payer: Self-pay | Admitting: Cardiology

## 2021-11-28 ENCOUNTER — Encounter: Payer: Self-pay | Admitting: Orthopedic Surgery

## 2021-11-28 ENCOUNTER — Ambulatory Visit (INDEPENDENT_AMBULATORY_CARE_PROVIDER_SITE_OTHER): Payer: Medicare Other | Admitting: Orthopedic Surgery

## 2021-11-28 DIAGNOSIS — Z89432 Acquired absence of left foot: Secondary | ICD-10-CM

## 2021-11-28 DIAGNOSIS — Z89511 Acquired absence of right leg below knee: Secondary | ICD-10-CM | POA: Diagnosis not present

## 2021-11-28 DIAGNOSIS — S88111A Complete traumatic amputation at level between knee and ankle, right lower leg, initial encounter: Secondary | ICD-10-CM

## 2021-11-28 DIAGNOSIS — I2510 Atherosclerotic heart disease of native coronary artery without angina pectoris: Secondary | ICD-10-CM | POA: Diagnosis not present

## 2021-11-28 DIAGNOSIS — L97521 Non-pressure chronic ulcer of other part of left foot limited to breakdown of skin: Secondary | ICD-10-CM

## 2021-11-28 NOTE — Progress Notes (Signed)
Office Visit Note   Patient: YEHIA MCBAIN           Date of Birth: [birth year]           MRN: [id number] Visit Date: 11/28/2021              Requested by: [name], MD [facility] [employer] [place] [address],  [place] [address] PCP: [name], MD  Chief Complaint  Patient presents with   Left Foot - Wound Check    Hx left transmet Ulcer f/u      HPI: Patient is a 71 year old gentleman who is seen for a Wagner grade 1 ulcer plantar aspect left transmetatarsal amputation status post right transtibial amputation.  Assessment & Plan: Visit Diagnoses:  1. Below-knee amputation of right lower extremity ([place])   2. History of transmetatarsal amputation of left foot ([place])   3. Non-pressure chronic ulcer of other part of left foot limited to breakdown of skin ([place])     Plan: Ulcer was debrided and the felt relieving pad under his custom orthotic was modified.  Follow-Up Instructions: Return in about 4 weeks (around 12/26/2021).   Ortho Exam  Patient is alert, oriented, no adenopathy, well-dressed, normal affect, normal respiratory effort. Examination patient does complain of pain beneath the first metatarsal however there is no redness no callus no ulcers.  He does have a Wagner grade 1 ulcer beneath the second metatarsal.  After informed consent a 10 blade knife was used to debride the skin and soft tissue back to healthy viable tissue.  After the debridement the ulcer is 2 cm in diameter 2 mm deep with healthy granulation tissue.  No exposed bone or tendon no infection.  Patient's felt relieving pad beneath his custom orthotic was modified.  Imaging: No results found. No images are attached to the encounter.  Labs: Lab Results  Component Value Date   HGBA1C 6.8 (H) 03/28/2020   HGBA1C 6.5 (H) 09/26/2015   HGBA1C 7.0 (H) 06/04/2014   REPTSTATUS 08/01/2016 FINAL 07/30/2016   GRAMSTAIN  06/04/2014    MODERATE WBC PRESENT, PREDOMINANTLY PMN NO SQUAMOUS EPITHELIAL CELLS  SEEN MODERATE GRAM POSITIVE COCCI IN CLUSTERS Performed at [facility]  06/04/2014    MODERATE WBC PRESENT, PREDOMINANTLY PMN NO SQUAMOUS EPITHELIAL CELLS SEEN MODERATE GRAM POSITIVE COCCI IN PAIRS Performed at [facility]  07/30/2016    NO GROWTH Performed at [facility] [address] [place], [place] [address]      Lab Results  Component Value Date   ALBUMIN 3.9 08/08/2021   ALBUMIN 3.5 03/28/2020   ALBUMIN 3.5 03/28/2020    Lab Results  Component Value Date   MG 1.8 08/08/2021   MG 2.0 04/02/2020   Lab Results  Component Value Date   VD25OH 53.03 03/28/2020    No results found for: "PREALBUMIN"    Latest Ref Rng & Units 08/08/2021    2:50 AM 04/01/2020    3:31 AM 03/31/2020    5:23 AM  CBC EXTENDED  WBC 4.0 - 10.5 K/uL 6.8  8.6  9.0   RBC 4.22 - 5.81 MIL/uL 5.17  3.92  3.98   Hemoglobin 13.0 - 17.0 g/dL 15.9  12.3  12.5   HCT 39.0 - 52.0 % 47.2  36.0  36.0   Platelets 150 - 400 K/uL 197  205  181   NEUT# 1.7 - 7.7 K/uL 4.4     Lymph# 0.7 -  4.0 K/uL 1.3        There is no height or weight on file to calculate BMI.  Orders:  No orders of the defined types were placed in this encounter.  No orders of the defined types were placed in this encounter.    Procedures: No procedures performed  Clinical Data: No additional findings.  ROS:  All other systems negative, except as noted in the HPI. Review of Systems  Objective: Vital Signs: There were no vitals taken for this visit.  Specialty Comments:  No specialty comments available.  PMFS History: Patient Active Problem List   Diagnosis Date Noted   Hypotension 01/07/2021   S/P arthroscopy of right shoulder 06/02/2020   Closed fracture of neck of right femur ([place])    Hip fracture ([place]) 03/27/2020   Nontraumatic complete tear of right rotator cuff 02/26/2020   S/P transmetatarsal amputation of foot, left ([place]) 04/25/2016   Chronic osteomyelitis  of toe, left (HCC)    Chronic diastolic CHF (congestive heart failure) ([place]) 09/25/2015   Aortic stenosis 08/04/2014   Diabetic foot infection ([place]) 06/03/2014   Peripheral vascular disease ([place]) 06/03/2014   Coronary artery disease involving native coronary artery of native heart without angina pectoris 07/29/2013   Trifascicular block 07/29/2013   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 remote stenting to ramus, prox LAD x2.   Dental crowns present    Diabetes mellitus    First degree AV block    GERD (gastroesophageal reflux disease)    Heart attack ([place]) 06/2004   Heart murmur    aortic   Hx MRSA infection 02/2006   Hyperlipidemia    Hypertension    hx. of - has not been on med. since losing wt. after gastric bypass   Hypothyroidism    Morbid obesity ([place])    Mucoid cyst of joint 09/2011   left thumb   Sleep apnea    sleep study 03/29/2011; no CPAP use, lost >130lbs   Trifascicular block     Family History  Problem Relation Age of Onset   Heart disease Mother     Past Surgical History:  Procedure Laterality Date   AMPUTATION Left 04/19/2016   Procedure: Left Foot 4th Toe Amputation vs. Transmetatarsal;  Surgeon: [name], MD;  Location: [facility];  Service: Orthopedics;  Laterality: Left;   BIOPSY  04/07/2019   Procedure: BIOPSY;  Surgeon: Otis Brace, MD;  [facility];  Service: Gastroenterology;;   CATARACT EXTRACTION  02/2010; 03/2010   COLONOSCOPY WITH PROPOFOL N/A 09/14/2014   Procedure: COLONOSCOPY WITH PROPOFOL;  Surgeon: Garlan Fair, MD;  [facility];  Service: Endoscopy;  Laterality: N/A;    COLONOSCOPY WITH PROPOFOL N/A 04/07/2019   Procedure: COLONOSCOPY WITH PROPOFOL;  Surgeon: Otis Brace, MD;  [facility];  Service: Gastroenterology;  Laterality: N/A;   CORONARY ANGIOPLASTY WITH STENT PLACEMENT  07/12/2004; 08/03/2004   total of 3 stents   I & D EXTREMITY Right 06/04/2014   Procedure: IRRIGATION AND DEBRIDEMENT EXTREMITY;  Surgeon: [name], MD;  Location: [facility];  Service: Orthopedics;  Laterality: Right;  MASS EXCISION  10/04/2011   Procedure: EXCISION MASS;  Surgeon: Wynonia Sours, MD;  Location: [facility];  Service: Orthopedics;  Laterality: Left;  excision cyst debridment ip joint of left thumb   PROSTATECTOMY     right below knee amputation  2018   ROUX-EN-Y GASTRIC BYPASS  10/10/2010   laparoscopic   SHOULDER ARTHROSCOPY WITH ROTATOR CUFF REPAIR AND SUBACROMIAL DECOMPRESSION Right 03/23/2020   Procedure: RIGHT SHOULDER ARTHROSCOPY WITH DEBRIDEMENT AND  ROTATOR CUFF REPAIR;  Surgeon: [name], MD;  Location: [facility];  Service: Orthopedics;  Laterality: Right;   TOE AMPUTATION  02/23/2006   left foot second ray amputation   TOTAL HIP ARTHROPLASTY Right 03/29/2020   Procedure: TOTAL HIP ARTHROPLASTY POSTERIOR APPROACH;  Surgeon: [name], MD;  Location: [facility];  Service: Orthopedics;  Laterality: Right;   TRANSMETATARSAL AMPUTATION Left    TRIGGER FINGER RELEASE Right 09/16/2019   Procedure: RELEASE TRIGGER FINGER/A-1 PULLEY;  Surgeon: Daryll Brod, MD;  Location: [facility];  Service: Orthopedics;  Laterality: Right;  IV REGIONAL FOREARM BLOCK   Social History   Occupational History   Occupation: Engineer, maintenance (IT) taxes  Tobacco Use   Smoking status: Never   Smokeless tobacco: Never  Substance and Sexual Activity   Alcohol use: No   Drug use: No   Sexual activity: Yes

## 2021-12-23 ENCOUNTER — Ambulatory Visit: Payer: Medicare Other | Attending: Cardiology

## 2021-12-23 DIAGNOSIS — Z01812 Encounter for preprocedural laboratory examination: Secondary | ICD-10-CM

## 2021-12-23 DIAGNOSIS — I77810 Thoracic aortic ectasia: Secondary | ICD-10-CM | POA: Diagnosis not present

## 2021-12-23 DIAGNOSIS — J0100 Acute maxillary sinusitis, unspecified: Secondary | ICD-10-CM | POA: Diagnosis not present

## 2021-12-24 LAB — BASIC METABOLIC PANEL
BUN/Creatinine Ratio: 16 (ref 10–24)
BUN: 15 mg/dL (ref 8–27)
CO2: 21 mmol/L (ref 20–29)
Calcium: 9.7 mg/dL (ref 8.6–10.2)
Chloride: 102 mmol/L (ref 96–106)
Creatinine, Ser: 0.93 mg/dL (ref 0.76–1.27)
Glucose: 119 mg/dL — ABNORMAL HIGH (ref 70–99)
Potassium: 4.2 mmol/L (ref 3.5–5.2)
Sodium: 141 mmol/L (ref 134–144)
eGFR: 87 mL/min/{1.73_m2} (ref >59)

## 2021-12-26 ENCOUNTER — Ambulatory Visit: Payer: Medicare Other | Admitting: Orthopedic Surgery

## 2021-12-28 ENCOUNTER — Encounter: Payer: Self-pay | Admitting: Family

## 2021-12-28 ENCOUNTER — Ambulatory Visit (INDEPENDENT_AMBULATORY_CARE_PROVIDER_SITE_OTHER): Payer: Medicare Other | Admitting: Family

## 2021-12-28 DIAGNOSIS — L97521 Non-pressure chronic ulcer of other part of left foot limited to breakdown of skin: Secondary | ICD-10-CM | POA: Diagnosis not present

## 2021-12-28 DIAGNOSIS — Z89432 Acquired absence of left foot: Secondary | ICD-10-CM | POA: Diagnosis not present

## 2021-12-28 DIAGNOSIS — E1159 Type 2 diabetes mellitus with other circulatory complications: Secondary | ICD-10-CM | POA: Diagnosis not present

## 2021-12-28 DIAGNOSIS — I2510 Atherosclerotic heart disease of native coronary artery without angina pectoris: Secondary | ICD-10-CM

## 2021-12-28 MED ORDER — GABAPENTIN 100 MG PO CAPS: 100.0000 mg | ORAL_CAPSULE | Freq: Three times a day (TID) | ORAL | 1 refills | Status: DC

## 2021-12-28 NOTE — Progress Notes (Signed)
Office Visit Note   Patient: [name]           Date of Birth: 1950-05-18           MRN: [id number] Visit Date: 12/28/2021              Requested by: [name], MD [facility] [employer] [place] [address],  [place] [id number] PCP: [name], MD  Chief Complaint  Patient presents with   Left Foot - Wound Check      HPI: The patient is a 71 year old gentleman seen for evaluation of Wagner grade 1 ulcer to the left transmetatarsal amputation.  He endorses worse nerve pain in the foot feels his neuropathy is worsening he is currently not using anything for pain medication the pain was so great that he had difficulty sleeping for many hours last night.  No recent injuries falls no fever chills no redness no worse drainage  Assessment & Plan: Visit Diagnoses: No diagnosis found.  Plan: Will place on gabapentin he will begin by taking this for weeks just at night.  Working up to taking it 3 times a day.  We will discuss this at follow-up.  Continue with felt pressure relieving pads and modification to his shoe wear protective shoe wear daily Dial soap cleansing and dressings to the ulcer  Follow-Up Instructions: No follow-ups on file.   Ortho Exam  Patient is alert, oriented, no adenopathy, well-dressed, normal affect, normal respiratory effort. On examination of the left foot the Wagner grade 1 ulcer beneath the second metatarsal is surrounded by callus macerated tissue this was debrided of nonviable tissue with a 10 blade knife back to viable tissue after debridement the ulcer is 15 mm in diameter and 5 mm deep.  There is no exposed bone this wound does not probe there is no surrounding erythema or warmth  Imaging: No results found. No images are attached to the encounter.  Labs: Lab Results  Component Value Date   HGBA1C 6.8 (H) 03/28/2020   HGBA1C 6.5 (H) 09/26/2015   HGBA1C 7.0 (H) 06/04/2014   REPTSTATUS 08/01/2016 FINAL 07/30/2016   GRAMSTAIN  06/04/2014    MODERATE  WBC PRESENT, PREDOMINANTLY PMN NO SQUAMOUS EPITHELIAL CELLS SEEN MODERATE GRAM POSITIVE COCCI IN CLUSTERS Performed at [facility]  06/04/2014    MODERATE WBC PRESENT, PREDOMINANTLY PMN NO SQUAMOUS EPITHELIAL CELLS SEEN MODERATE GRAM POSITIVE COCCI IN PAIRS Performed at [facility]  07/30/2016    NO GROWTH Performed at [facility] [address] [place],  [address]      Lab Results  Component Value Date   ALBUMIN 3.9 08/08/2021   ALBUMIN 3.5 03/28/2020   ALBUMIN 3.5 03/28/2020    Lab Results  Component Value Date   MG 1.8 08/08/2021   MG 2.0 04/02/2020   Lab Results  Component Value Date   VD25OH 53.03 03/28/2020    No results found for: "PREALBUMIN"    Latest Ref Rng & Units 08/08/2021    2:50 AM 04/01/2020    3:31 AM 03/31/2020    5:23 AM  CBC EXTENDED  WBC 4.0 - 10.5 K/uL 6.8  8.6  9.0   RBC 4.22 - 5.81 MIL/uL 5.17  3.92  3.98   Hemoglobin 13.0 - 17.0 g/dL 15.9  12.3  12.5   HCT 39.0 - 52.0 % 47.2  36.0  36.0   Platelets 150 - 400 K/uL 197  205  181   NEUT# 1.7 - 7.7 K/uL 4.4     Lymph# 0.7 - 4.0 K/uL 1.3        There is no height or weight on file to calculate BMI.  Orders:  No orders of the defined types were placed in this encounter.  Meds ordered this encounter  Medications   gabapentin (NEURONTIN) 100 MG capsule    Sig: Take 1 capsule (100 mg total) by mouth 3 (three) times daily.    Dispense:  90 capsule    Refill:  1     Procedures: No procedures performed  Clinical Data: No additional findings.  ROS:  All other systems negative, except as noted in the HPI. Review of Systems  Objective: Vital Signs: There were no vitals taken for this visit.  Specialty Comments:  No specialty comments available.  PMFS History: Patient Active Problem List   Diagnosis Date Noted   Hypotension 01/07/2021   S/P arthroscopy of right shoulder 06/02/2020   Closed fracture of neck of right femur  ([facility])    Hip fracture ([facility]) 03/27/2020   Nontraumatic complete tear of right rotator cuff 02/26/2020   S/P transmetatarsal amputation of foot, left ([facility]) 04/25/2016   Chronic osteomyelitis of toe, left ([facility])    Chronic diastolic CHF (congestive heart failure) ([facility]) 09/25/2015   Aortic stenosis 08/04/2014   Diabetic foot infection ([facility]) 06/03/2014   Peripheral vascular disease ([facility]) 06/03/2014   Coronary artery disease involving native coronary artery of native heart without angina pectoris 07/29/2013   Trifascicular block 07/29/2013   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 07/26/2011   Hypothyroidism 01/22/2007   Type 2 diabetes mellitus with vascular disease ([facility]) 01/22/2007   HYPERLIPIDEMIA, MIXED 01/22/2007   MYOCARDIAL INFARCTION, HX OF 01/22/2007   ANEMIA, IRON DEFICIENCY, HX OF 01/22/2007   Past Medical History:  Diagnosis Date   Anemia    low iron   Arthritis    Coronary artery disease 2006   a. remote stenting to ramus, prox LAD x2.   Dental crowns present    Diabetes mellitus    First degree AV block    GERD (gastroesophageal reflux disease)    Heart attack ([facility]) 06/2004   Heart murmur    aortic   Hx MRSA infection 02/2006   Hyperlipidemia    Hypertension    hx. of - has not been on med. since losing wt. after gastric bypass   Hypothyroidism    Morbid obesity ([facility])    Mucoid cyst of joint 09/2011   left thumb   Sleep apnea    sleep study 03/29/2011; no CPAP use, lost >130lbs   Trifascicular block     Family History  Problem Relation Age of Onset   Heart disease Mother     Past Surgical History:  Procedure Laterality Date   AMPUTATION Left 04/19/2016   Procedure: Left Foot 4th Toe Amputation vs. Transmetatarsal;  Surgeon: [name], MD;  Location: [facility];  Service: Orthopedics;  Laterality: Left;   BIOPSY  04/07/2019   Procedure: BIOPSY;  Surgeon: Otis Brace, MD;  Location: [facility];  Service: Gastroenterology;;   CATARACT EXTRACTION  02/2010;  03/2010   COLONOSCOPY WITH PROPOFOL N/A 09/14/2014   Procedure: COLONOSCOPY WITH PROPOFOL;  Surgeon: Garlan Fair, MD;  Location: [facility];  Service: Endoscopy;  Laterality: N/A;   COLONOSCOPY WITH PROPOFOL N/A 04/07/2019   Procedure: COLONOSCOPY WITH PROPOFOL;  Surgeon: Otis Brace, MD;  Location: [facility];  Service: Gastroenterology;  Laterality:  N/A;   CORONARY ANGIOPLASTY WITH STENT PLACEMENT  07/12/2004; 08/03/2004   total of 3 stents   I & D EXTREMITY Right 06/04/2014   Procedure: IRRIGATION AND DEBRIDEMENT EXTREMITY;  Surgeon: [name], MD;  Location: [facility];  Service: Orthopedics;  Laterality: Right;   MASS EXCISION  10/04/2011   Procedure: EXCISION MASS;  Surgeon: Wynonia Sours, MD;  Location: [facility];  Service: Orthopedics;  Laterality: Left;  excision cyst debridment ip joint of left thumb   PROSTATECTOMY     right below knee amputation  2018   ROUX-EN-Y GASTRIC BYPASS  10/10/2010   laparoscopic   SHOULDER ARTHROSCOPY WITH ROTATOR CUFF REPAIR AND SUBACROMIAL DECOMPRESSION Right 03/23/2020   Procedure: RIGHT SHOULDER ARTHROSCOPY WITH DEBRIDEMENT AND  ROTATOR CUFF REPAIR;  Surgeon: [name], MD;  Location: [facility];  Service: Orthopedics;  Laterality: Right;   TOE AMPUTATION  02/23/2006   left foot second ray amputation   TOTAL HIP ARTHROPLASTY Right 03/29/2020   Procedure: TOTAL HIP ARTHROPLASTY POSTERIOR APPROACH;  Surgeon: [name], MD;  Location: [facility];  Service: Orthopedics;  Laterality: Right;   TRANSMETATARSAL AMPUTATION Left    TRIGGER FINGER RELEASE Right 09/16/2019   Procedure: RELEASE TRIGGER FINGER/A-1 PULLEY;  Surgeon: Daryll Brod, MD;  Location: [facility];  Service: Orthopedics;  Laterality: Right;  IV REGIONAL FOREARM BLOCK   Social History   Occupational History   Occupation: Engineer, maintenance (IT) taxes  Tobacco Use   Smoking status: Never   Smokeless tobacco: Never  Substance and Sexual Activity   Alcohol use:  No   Drug use: No   Sexual activity: Yes

## 2021-12-30 ENCOUNTER — Ambulatory Visit (HOSPITAL_COMMUNITY)
Admission: RE | Admit: 2021-12-30 | Discharge: 2021-12-30 | Disposition: A | Discharge status: Home / Self Care | Payer: Medicare Other | Source: Ambulatory Visit | Attending: Cardiology | Admitting: Cardiology

## 2021-12-30 DIAGNOSIS — I77810 Thoracic aortic ectasia: Secondary | ICD-10-CM

## 2021-12-30 DIAGNOSIS — Z01812 Encounter for preprocedural laboratory examination: Secondary | ICD-10-CM

## 2021-12-30 DIAGNOSIS — I7121 Aneurysm of the ascending aorta, without rupture: Secondary | ICD-10-CM | POA: Diagnosis not present

## 2021-12-30 MED ORDER — SODIUM CHLORIDE (PF) 0.9 % IJ SOLN
INTRAMUSCULAR | Status: AC
  Filled 2021-12-30: qty 50

## 2021-12-30 MED ORDER — IOHEXOL 350 MG/ML SOLN
75.0000 mL | Freq: Once | INTRAVENOUS | Status: AC | PRN
  Administered 2021-12-30: 80 mL via INTRAVENOUS

## 2022-01-03 ENCOUNTER — Telehealth: Payer: Self-pay | Admitting: Orthopedic Surgery

## 2022-01-03 NOTE — Telephone Encounter (Signed)
Pt called requesting Dr Sharol Given to send new script to [facility] for liner for prothesis. Pt phone number is [phone].

## 2022-01-04 ENCOUNTER — Telehealth: Payer: Self-pay

## 2022-01-04 DIAGNOSIS — I7121 Aneurysm of the ascending aorta, without rupture: Secondary | ICD-10-CM

## 2022-01-04 NOTE — Telephone Encounter (Signed)
-----   Message from Jerline Pain, MD sent at 12/30/2021  5:09 PM EST ----- Aorta on CT demonstrated 5 cm ascending aorta.  Aortic arch is 4.4 cm.  Given current size, lets go ahead and refer to [facility] for further monitoring. Candee Furbish, MD

## 2022-01-04 NOTE — Telephone Encounter (Signed)
The patient has been notified of the result and verbalized understanding.  All questions (if any) were answered. Michaelyn Barter, RN 01/04/2022 11:39 AM   Placed order for referral.

## 2022-01-04 NOTE — Telephone Encounter (Signed)
Can you please write rx

## 2022-01-06 ENCOUNTER — Ambulatory Visit: Payer: Medicare Other | Attending: Cardiology | Admitting: Cardiology

## 2022-01-06 ENCOUNTER — Encounter: Payer: Self-pay | Admitting: Cardiology

## 2022-01-06 VITALS — BP 110/70 | HR 68 | Ht >= 80 in | Wt 237.0 lb

## 2022-01-06 DIAGNOSIS — I350 Nonrheumatic aortic (valve) stenosis: Secondary | ICD-10-CM | POA: Insufficient documentation

## 2022-01-06 DIAGNOSIS — R002 Palpitations: Secondary | ICD-10-CM | POA: Diagnosis not present

## 2022-01-06 DIAGNOSIS — I2510 Atherosclerotic heart disease of native coronary artery without angina pectoris: Secondary | ICD-10-CM | POA: Diagnosis not present

## 2022-01-06 DIAGNOSIS — I7121 Aneurysm of the ascending aorta, without rupture: Secondary | ICD-10-CM | POA: Insufficient documentation

## 2022-01-06 NOTE — Patient Instructions (Signed)
Medication Instructions:  The current medical regimen is effective;  continue present plan and medications.  *If you need a refill on your cardiac medications before your next appointment, please call your pharmacy*  Testing/Procedures: Your physician has requested that you have an echocardiogram. (After 1/6 and before 02/07/22) Echocardiography is a painless test that uses sound waves to create images of your heart. It provides your doctor with information about the size and shape of your heart and how well your heart's chambers and valves are working. This procedure takes approximately one hour. There are no restrictions for this procedure. Please do NOT wear cologne, perfume, aftershave, or lotions (deodorant is allowed). Please arrive 15 minutes prior to your appointment time.   Follow-Up: At [facility], you and your health needs are our priority.  As part of our continuing mission to provide you with exceptional heart care, we have created designated Provider Care Teams.  These Care Teams include your primary Cardiologist (physician) and Advanced Practice Providers (APPs -  Physician Assistants and Nurse Practitioners) who all work together to provide you with the care you need, when you need it.  We recommend signing up for the patient portal called "MyChart".  Sign up information is provided on this After Visit Summary.  MyChart is used to connect with patients for Virtual Visits (Telemedicine).  Patients are able to view lab/test results, encounter notes, upcoming appointments, etc.  Non-urgent messages can be sent to your provider as well.   To learn more about what you can do with MyChart, go to [email or web address].    Your next appointment:   6 month(s)  The format for your next appointment:   In Person  Provider:   Candee Furbish, MD      Important Information About Sugar

## 2022-01-06 NOTE — Progress Notes (Signed)
Cardiology Office Note:    Date:  01/06/2022   ID:  Everlene Other, DOB Nov 11, 1950, MRN [id number]  PCP:  Charlane Ferretti, MD  [facility] Cardiologist:  Candee Furbish, MD  [facility] Electrophysiologist:  None   Referring MD: [name], MD     History of Present Illness:    [name] is a 71 y.o. male with coronary disease hypertension hyperlipidemia diabetes type 2 OSA here for follow-up.  PCI to ramus in 2006.  2 stents to the proximal LAD.  Diffuse disease in distal RCA.  Nuclear stress test 2017 showed fixed inferior defect.  Underwent gastric bypass surgery in September 2012.  Good weight loss.  First-degree AV block bifascicular block sinus bradycardia noted previously.  Echocardiogram with 42 mm dilated aortic root, mild aortic stenosis and mild aortic regurgitation.  EF of 45 to 50%.  He was last seen by [name].  Underwent orthopedic surgery on hand, doing well.  Overall been doing quite well no fevers chills nausea vomiting syncope bleeding.  Is having some dizziness when getting up from a seated position.  Occasionally.  No chest pain  Today, (01/07/2021)  His EKG is stable during this visit. He denies having any episodes of syncope, chest pain.   During last visit he had an episode of feeling light headed after getting up fast, since then he has no other episodes. He reduced his lisinopril and reports no new issues while taking it.   He continues taking atorvastatin and reports no new issues while taking it. He denies having any muscle aches  He reports last march breaking his great toe on his right foot. He is recovering well at this time.    Past Medical History:  Diagnosis Date   Anemia    low iron   Arthritis    Coronary artery disease 2006   a. remote stenting to ramus, prox LAD x2.   Dental crowns present    Diabetes mellitus    First degree AV block    GERD (gastroesophageal reflux disease)    Heart attack ([place]) 06/2004   Heart murmur     aortic   Hx MRSA infection 02/2006   Hyperlipidemia    Hypertension    hx. of - has not been on med. since losing wt. after gastric bypass   Hypothyroidism    Morbid obesity ([place])    Mucoid cyst of joint 09/2011   left thumb   Sleep apnea    sleep study 03/29/2011; no CPAP use, lost >130lbs   Trifascicular block     Past Surgical History:  Procedure Laterality Date   AMPUTATION Left 04/19/2016   Procedure: Left Foot 4th Toe Amputation vs. Transmetatarsal;  Surgeon: [name], MD;  Location: [place];  Service: Orthopedics;  Laterality: Left;   BIOPSY  04/07/2019   Procedure: BIOPSY;  Surgeon: Otis Brace, MD;  [facility];  Service: Gastroenterology;;   CATARACT EXTRACTION  02/2010; 03/2010   COLONOSCOPY WITH PROPOFOL N/A 09/14/2014   Procedure: COLONOSCOPY WITH PROPOFOL;  Surgeon: Garlan Fair, MD;  [facility];  Service: Endoscopy;  Laterality: N/A;   COLONOSCOPY WITH PROPOFOL N/A 04/07/2019   Procedure: COLONOSCOPY WITH PROPOFOL;  Surgeon: Otis Brace, MD;  [facility];  Service: Gastroenterology;  Laterality: N/A;   CORONARY ANGIOPLASTY WITH STENT PLACEMENT  07/12/2004; 08/03/2004   total of 3 stents   I & D EXTREMITY Right 06/04/2014   Procedure: IRRIGATION AND DEBRIDEMENT EXTREMITY;  Surgeon:  [name], MD;  Location: [place];  Service: Orthopedics;  Laterality: Right;   MASS EXCISION  10/04/2011   Procedure: EXCISION MASS;  Surgeon: Wynonia Sours, MD;  Location: [place];  Service: Orthopedics;  Laterality: Left;  excision cyst debridment ip joint of left thumb   PROSTATECTOMY     right below knee amputation  2018   ROUX-EN-Y GASTRIC BYPASS  10/10/2010   laparoscopic   SHOULDER ARTHROSCOPY WITH ROTATOR CUFF REPAIR AND SUBACROMIAL DECOMPRESSION Right 03/23/2020   Procedure: RIGHT SHOULDER ARTHROSCOPY WITH DEBRIDEMENT AND  ROTATOR CUFF REPAIR;  Surgeon: [name], MD;  Location: [place];  Service:  Orthopedics;  Laterality: Right;   TOE AMPUTATION  02/23/2006   left foot second ray amputation   TOTAL HIP ARTHROPLASTY Right 03/29/2020   Procedure: TOTAL HIP ARTHROPLASTY POSTERIOR APPROACH;  Surgeon: [name], MD;  Location: [place];  Service: Orthopedics;  Laterality: Right;   TRANSMETATARSAL AMPUTATION Left    TRIGGER FINGER RELEASE Right 09/16/2019   Procedure: RELEASE TRIGGER FINGER/A-1 PULLEY;  Surgeon: Daryll Brod, MD;  Location: [place];  Service: Orthopedics;  Laterality: Right;  IV REGIONAL FOREARM BLOCK    Current Medications: Current Meds  Medication Sig   amoxicillin (AMOXIL) 500 MG capsule Take 4 pills one hour prior to dental work   aspirin EC 81 MG tablet Take 81 mg by mouth daily.   atorvastatin (LIPITOR) 10 MG tablet Take 10 mg by mouth at bedtime.    Calcium Citrate-Vitamin D (CALCIUM CITRATE CHEWY BITE PO) Take 500 mg by mouth 3 (three) times daily.   ferrous sulfate 325 (65 FE) MG tablet Take 325 mg by mouth daily.    fluticasone (FLONASE) 50 MCG/ACT nasal spray Place 2 sprays into both nostrils daily as needed for allergies.    gabapentin (NEURONTIN) 100 MG capsule Take 1 capsule (100 mg total) by mouth 3 (three) times daily.   Hypromellose (GENTEAL SEVERE OP) Place 1 drop into the right eye daily as needed (Dry eyes).   ketoconazole (NIZORAL) 2 % cream Apply 1 Application topically daily as needed for rash.   levothyroxine (SYNTHROID) 150 MCG tablet Take 150 mcg by mouth daily before breakfast.   lisinopril (ZESTRIL) 2.5 MG tablet TAKE 1 TABLET BY MOUTH EVERY DAY   metFORMIN (GLUCOPHAGE-XR) 500 MG 24 hr tablet Take 500 mg by mouth 2 (two) times daily.   Multiple Vitamin (MULTIVITAMIN WITH MINERALS) TABS tablet Take 1 tablet by mouth in the morning and at bedtime. Gummies (Patient taking differently: Take 2 tablets by mouth in the morning and at bedtime. Gummies)   nitroGLYCERIN (NITROSTAT) 0.4 MG SL tablet PLACE 1 TABLET (0.4 MG TOTAL) UNDER THE  TONGUE EVERY 5 (FIVE) MINUTES X 3 DOSES AS NEEDED FOR CHEST PAIN.   Omega 3 1000 MG CAPS Take 1,000 mg by mouth daily.   Omeprazole 20 MG TBEC Take 20 mg by mouth daily as needed (acid reflux/ indigestion).    Valerian Root 450 MG CAPS Take 450 mg by mouth at bedtime.   vitamin B-12 (CYANOCOBALAMIN) 1000 MCG tablet Take 1,000 mcg by mouth 3 (three) times a week. Mon, wed, Friday     Allergies:   Other, Moxifloxacin, Penicillins, Quinolones, and Sulfamethoxazole-trimethoprim   Social History   Socioeconomic History   Marital status: Married    Spouse name: Not on file   Number of children: Not on file   Years of education: Not on file   Highest education level:  Not on file  Occupational History   Occupation: Engineer, maintenance (IT) taxes  Tobacco Use   Smoking status: Never   Smokeless tobacco: Never  Substance and Sexual Activity   Alcohol use: No   Drug use: No   Sexual activity: Yes  Other Topics Concern   Not on file  Social History Narrative   Parents and sister all using CPAP   Social Determinants of Health   Financial Resource Strain: Not on file  Food Insecurity: Not on file  Transportation Needs: Not on file  Physical Activity: Not on file  Stress: Not on file  Social Connections: Not on file     Family History: The patient's family history includes Heart disease in his mother.  ROS:   Please see the history of present illness.     All other systems reviewed and are negative.  (-)Chest pain (-)Syncope (-)Lightheadedness (-)Muscle aches  EKGs/Labs/Other Studies Reviewed:    The following studies were reviewed today: above  EKG:   01/07/2021-  Sinus rhythm 61 First degree av block Left anterior ventricular block  Bifascicular block  Echocardiogram- - Left ventricle: The cavity size was normal. Wall thickness was    increased in a pattern of moderate LVH. Systolic function was    mildly reduced. The estimated ejection fraction was in the range    of 45% to 50%.  Inferior hypokinesis. Doppler parameters are    consistent with abnormal left ventricular relaxation (grade 1    diastolic dysfunction). The E/e&' ratio is between 8-15,    suggesting indeterminate LV filling pressure.  - Aortic valve: Trileaflet; mildly calcified leaflets. Mild    stenosis. There was mild regurgitation. Mean gradient (S): 14 mm    Hg. Peak gradient (S): 27 mm Hg.  - Aorta: Aortic root dimension: 42 mm (ED).  - Aortic root: The aortic root is mildly dilated.  - Mitral valve: Mildly thickened leaflets . There was mild    regurgitation.  - Left atrium: The atrium was normal in size.  - Right ventricle: Systolic function is mildly reduced.  - Right atrium: The atrium was normal in size.  - Inferior vena cava: The vessel was normal in size. The    respirophasic diameter changes were in the normal range (>= 50%),    consistent with normal central venous pressure.   Recent Labs: 08/08/2021: ALT 22; Hemoglobin 15.9; Magnesium 1.8; Platelets 197 12/23/2021: BUN 15; Creatinine, Ser 0.93; Potassium 4.2; Sodium 141  Recent Lipid Panel No results found for: "CHOL", "TRIG", "HDL", "CHOLHDL", "VLDL", "LDLCALC", "LDLDIRECT"   Risk Assessment/Calculations:       Physical Exam:    VS:  BP 110/70 (BP Location: Left Arm, Patient Position: Sitting, Cuff Size: Normal)   Pulse 68   Ht '6\' 8"'$  (2.032 m)   Wt 237 lb (107.5 kg)   SpO2 98%   BMI 26.04 kg/m     Wt Readings from Last 3 Encounters:  01/06/22 237 lb (107.5 kg)  09/05/21 239 lb 6.4 oz (108.6 kg)  08/09/21 244 lb (110.7 kg)     GEN:  Well nourished, well developed in no acute distress HEENT: Normal NECK: No JVD; No carotid bruits LYMPHATICS: No lymphadenopathy CARDIAC: RRR, no rubs, gallops, 2/6 systolic murmer RESPIRATORY:  Clear to auscultation without rales, wheezing or rhonchi  ABDOMEN: Soft, non-tender, non-distended MUSCULOSKELETAL:  No edema; right lower extremity amputation SKIN: Warm and dry NEUROLOGIC:   Alert and oriented x 3 PSYCHIATRIC:  Normal affect   ASSESSMENT:  1. Aneurysm of ascending aorta without rupture ([place])   2. Nonrheumatic aortic valve stenosis   3. Palpitations     Aortic aneurysm ascending Ascending aorta 5 cm on recent CT scan 12/30/2021.  Personally reviewed and interpreted.  Of course he is 6 foot 8.  Indexed this is clearly different than the standard norms.  He does have an appointment coming up with Dr. Roxan Hockey on January 16.  We will go ahead and check an echocardiogram prior to that given his previously described mild aortic stenosis.  Obviously the aortic valve may need to be replaced at time of aortic aneurysm repair.  Timing of surgery usually is around 5.5 cm but we may actually give him some leeway given his height of 6 foot 8.  I am interested to hear Dr. Leonarda Salon opinion.  He does understand that the larger of the diameter the more chance for dissection or rupture.  Avoid heavy Valsalva type maneuvers. -He did have an uncle on his mother side that was 6 foot 8 as well.  He died of pancreatic cancer.  He has no family history of aneurysm rupture.  Trifascicular block Stable on EKG.  May need pacemaker in the future.  Overall doing well without any high risk symptoms such as syncope.  Coronary artery disease involving native coronary artery of native heart without angina pectoris Prior PCI to ramus in 2006, 2 prior stents to the proximal LAD.  Diffuse distal disease in the RCA.  Prior nuclear stress in 2017 showed a fixed inferior defect.  Overall stable.  Continue with aspirin 81 mg, he is not on beta-blocker because of conduction delays.  S/P transmetatarsal amputation of foot, left (HCC) Stable.  HYPERLIPIDEMIA, MIXED Atorvastatin 10 mg.  No myalgias.  LDL 65.  Excellent.  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  Overall doing well.  Maximal weight at 1 point was 361 pounds.  Hypotension Previously we decreased his  lisinopril from 5 down to 2.5 mg because of blood pressure decreased/lightheadedness when getting up from a seated position.  Improved.  He will continue with low-dose lisinopril to help with renal protection in diabetes.  Aortic stenosis In 2018 his aortic stenosis was mild.  I do appreciate advancement of murmur on exam today.  We will repeat echocardiogram.  He is not having any shortness of breath.  Peripheral vascular disease Continue with aspirin, statin, good blood pressure control.  Good diabetes control.  Hip fracture ([place]) Unfortunately had right hip replacement 7 days after his right shoulder replacement.  Fell mechanically while trying to put his right leg prosthesis on.          Medication Adjustments/Labs and Tests Ordered: Current medicines are reviewed at length with the patient today.  Concerns regarding medicines are outlined above.  Orders Placed This Encounter  Procedures   ECHOCARDIOGRAM COMPLETE   No orders of the defined types were placed in this encounter.   Patient Instructions  Medication Instructions:  The current medical regimen is effective;  continue present plan and medications.  *If you need a refill on your cardiac medications before your next appointment, please call your pharmacy*  Testing/Procedures: Your physician has requested that you have an echocardiogram. (After 1/6 and before 02/07/22) Echocardiography is a painless test that uses sound waves to create images of your heart. It provides your doctor with information about the size and shape of your heart and how well your heart's chambers and valves are working. This procedure  takes approximately one hour. There are no restrictions for this procedure. Please do NOT wear cologne, perfume, aftershave, or lotions (deodorant is allowed). Please arrive 15 minutes prior to your appointment time.   Follow-Up: At [facility], you and your health needs are our priority.  As part of our  continuing mission to provide you with exceptional heart care, we have created designated Provider Care Teams.  These Care Teams include your primary Cardiologist (physician) and Advanced Practice Providers (APPs -  Physician Assistants and Nurse Practitioners) who all work together to provide you with the care you need, when you need it.  We recommend signing up for the patient portal called "MyChart".  Sign up information is provided on this After Visit Summary.  MyChart is used to connect with patients for Virtual Visits (Telemedicine).  Patients are able to view lab/test results, encounter notes, upcoming appointments, etc.  Non-urgent messages can be sent to your provider as well.   To learn more about what you can do with MyChart, go to [email or web address].    Your next appointment:   6 month(s)  The format for your next appointment:   In Person  Provider:   Candee Furbish, MD      Important Information About Sugar         Signed, Candee Furbish, MD  01/06/2022 12:26 PM    [place]   I,Shehryar Baig,acting as a scribe for Candee Furbish, MD.,have documented all relevant documentation on the behalf of Candee Furbish, MD,as directed by  Candee Furbish, MD while in the presence of Candee Furbish, MD.  .I, Candee Furbish, MD, have reviewed all documentation for this visit. The documentation on 01/06/22 for the exam, diagnosis, procedures, and orders are all accurate and complete.

## 2022-01-09 DIAGNOSIS — H9201 Otalgia, right ear: Secondary | ICD-10-CM | POA: Diagnosis not present

## 2022-01-19 ENCOUNTER — Encounter: Payer: Self-pay | Admitting: Cardiology

## 2022-01-20 MED ORDER — NITROGLYCERIN 0.4 MG SL SUBL: 0.4000 mg | SUBLINGUAL_TABLET | SUBLINGUAL | 3 refills | Status: AC | PRN

## 2022-01-26 ENCOUNTER — Ambulatory Visit (INDEPENDENT_AMBULATORY_CARE_PROVIDER_SITE_OTHER): Payer: Medicare Other | Admitting: Orthopedic Surgery

## 2022-01-26 DIAGNOSIS — L97521 Non-pressure chronic ulcer of other part of left foot limited to breakdown of skin: Secondary | ICD-10-CM

## 2022-01-27 ENCOUNTER — Encounter: Payer: Self-pay | Admitting: Orthopedic Surgery

## 2022-01-27 DIAGNOSIS — L309 Dermatitis, unspecified: Secondary | ICD-10-CM | POA: Diagnosis not present

## 2022-01-27 NOTE — Progress Notes (Signed)
Office Visit Note   Patient: [name]           Date of Birth: Jun 04, 1950           MRN: [id number] Visit Date: 01/26/2022              Requested by: [name], MD [facility] [employer] [address],  [place] [address] PCP: Charlane Ferretti, MD  Chief Complaint  Patient presents with   Left Foot - Wound Check    Hx transmet amputation      HPI: Patient is a 72 year old gentleman status post transmetatarsal amputation with a Wagner grade 1 ulcer.  Patient has been using felt relieving pads and has increased his Neurontin to 3 times a day.  Assessment & Plan: Visit Diagnoses:  1. Non-pressure chronic ulcer of other part of left foot limited to breakdown of skin ([place])     Plan: Continue with current pressure offloading.  Follow-Up Instructions: Return in about 4 weeks (around 02/23/2022).   Ortho Exam  Patient is alert, oriented, no adenopathy, well-dressed, normal affect, normal respiratory effort. Examination patient has a Wagner grade 1 ulcer left transmetatarsal amputation there is healthy granulation tissue no cellulitis odor or drainage.  After informed consent a 10 blade knife was used to debride the skin and soft tissue back to healthy viable tissue the wound measures 3 cm in diameter after debridement.  Imaging: No results found. No images are attached to the encounter.  Labs: Lab Results  Component Value Date   HGBA1C 6.8 (H) 03/28/2020   HGBA1C 6.5 (H) 09/26/2015   HGBA1C 7.0 (H) 06/04/2014   REPTSTATUS 08/01/2016 FINAL 07/30/2016   GRAMSTAIN  06/04/2014    MODERATE WBC PRESENT, PREDOMINANTLY PMN NO SQUAMOUS EPITHELIAL CELLS SEEN MODERATE GRAM POSITIVE COCCI IN CLUSTERS Performed at [facility]  06/04/2014    MODERATE WBC PRESENT, PREDOMINANTLY PMN NO SQUAMOUS EPITHELIAL CELLS SEEN MODERATE GRAM POSITIVE COCCI IN PAIRS Performed at [facility]  07/30/2016    NO GROWTH Performed at [facility] [address], [place], [place] [address]      Lab Results  Component Value Date   ALBUMIN 3.9 08/08/2021   ALBUMIN 3.5 03/28/2020   ALBUMIN 3.5 03/28/2020    Lab Results  Component Value Date   MG 1.8 08/08/2021   MG 2.0 04/02/2020   Lab Results  Component Value Date   VD25OH 53.03 03/28/2020    No results found for: "PREALBUMIN"    Latest Ref Rng & Units 08/08/2021    2:50 AM 04/01/2020    3:31 AM 03/31/2020    5:23 AM  CBC EXTENDED  WBC 4.0 - 10.5 K/uL 6.8  8.6  9.0   RBC 4.22 - 5.81 MIL/uL 5.17  3.92  3.98   Hemoglobin 13.0 - 17.0 g/dL 15.9  12.3  12.5   HCT 39.0 - 52.0 % 47.2  36.0  36.0   Platelets 150 - 400 K/uL 197  205  181   NEUT# 1.7 - 7.7 K/uL 4.4     Lymph# 0.7 - 4.0 K/uL 1.3        There is no height or weight on file to calculate BMI.  Orders:  No orders of the defined types were placed in this encounter.  No orders of the defined types were placed in this encounter.    Procedures: No procedures performed  Clinical Data: No additional findings.  ROS:  All other systems negative, except as noted in the HPI. Review of Systems  Objective: Vital Signs: There were no vitals taken for this visit.  Specialty Comments:  No specialty comments available.  PMFS History: Patient Active Problem List   Diagnosis Date Noted   Hypotension 01/07/2021   S/P arthroscopy of right shoulder 06/02/2020   Closed fracture of neck of right femur ([place])    Hip fracture ([place]) 03/27/2020   Nontraumatic complete tear of right rotator cuff 02/26/2020   S/P transmetatarsal amputation of foot, left ([place]) 04/25/2016   Chronic osteomyelitis of toe, left (HCC)    Chronic diastolic CHF (congestive heart failure) ([place]) 09/25/2015   Aortic stenosis 08/04/2014   Diabetic foot infection ([place]) 06/03/2014   Peripheral vascular disease ([place]) 06/03/2014   Coronary artery disease involving native coronary artery of native heart without angina pectoris 07/29/2013   Trifascicular  block 07/29/2013   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 07/26/2011   Hypothyroidism 01/22/2007   Type 2 diabetes mellitus with vascular disease ([place]) 01/22/2007   HYPERLIPIDEMIA, MIXED 01/22/2007   MYOCARDIAL INFARCTION, HX OF 01/22/2007   ANEMIA, IRON DEFICIENCY, HX OF 01/22/2007   Past Medical History:  Diagnosis Date   Anemia    low iron   Arthritis    Coronary artery disease 2006   a. remote stenting to ramus, prox LAD x2.   Dental crowns present    Diabetes mellitus    First degree AV block    GERD (gastroesophageal reflux disease)    Heart attack ([place]) 06/2004   Heart murmur    aortic   Hx MRSA infection 02/2006   Hyperlipidemia    Hypertension    hx. of - has not been on med. since losing wt. after gastric bypass   Hypothyroidism    Morbid obesity ([place])    Mucoid cyst of joint 09/2011   left thumb   Sleep apnea    sleep study 03/29/2011; no CPAP use, lost >130lbs   Trifascicular block     Family History  Problem Relation Age of Onset   Heart disease Mother     Past Surgical History:  Procedure Laterality Date   AMPUTATION Left 04/19/2016   Procedure: Left Foot 4th Toe Amputation vs. Transmetatarsal;  Surgeon: [name], MD;  Location: [facility];  Service: Orthopedics;  Laterality: Left;   BIOPSY  04/07/2019   Procedure: BIOPSY;  Surgeon: Otis Brace, MD;  [facility];  Service: Gastroenterology;;   CATARACT EXTRACTION  02/2010; 03/2010   COLONOSCOPY WITH PROPOFOL N/A 09/14/2014   Procedure: COLONOSCOPY WITH PROPOFOL;  Surgeon: Garlan Fair, MD;  [facility];  Service: Endoscopy;  Laterality: N/A;   COLONOSCOPY WITH PROPOFOL N/A 04/07/2019   Procedure: COLONOSCOPY WITH PROPOFOL;  Surgeon: Otis Brace, MD;  [facility];  Service: Gastroenterology;  Laterality: N/A;   CORONARY ANGIOPLASTY WITH STENT PLACEMENT  07/12/2004; 08/03/2004   total of 3 stents   I & D EXTREMITY Right 06/04/2014   Procedure: IRRIGATION AND  DEBRIDEMENT EXTREMITY;  Surgeon: [name], MD;  Location: [facility];  Service: Orthopedics;  Laterality: Right;   MASS EXCISION  10/04/2011   Procedure: EXCISION MASS;  Surgeon: Wynonia Sours, MD;  Location: [facility];  Service: Orthopedics;  Laterality: Left;  excision cyst debridment ip joint of left thumb   PROSTATECTOMY     right below knee amputation  2018   ROUX-EN-Y GASTRIC BYPASS  10/10/2010   laparoscopic  SHOULDER ARTHROSCOPY WITH ROTATOR CUFF REPAIR AND SUBACROMIAL DECOMPRESSION Right 03/23/2020   Procedure: RIGHT SHOULDER ARTHROSCOPY WITH DEBRIDEMENT AND  ROTATOR CUFF REPAIR;  Surgeon: [name], MD;  Location: [facility];  Service: Orthopedics;  Laterality: Right;   TOE AMPUTATION  02/23/2006   left foot second ray amputation   TOTAL HIP ARTHROPLASTY Right 03/29/2020   Procedure: TOTAL HIP ARTHROPLASTY POSTERIOR APPROACH;  Surgeon: [name], MD;  Location: [facility];  Service: Orthopedics;  Laterality: Right;   TRANSMETATARSAL AMPUTATION Left    TRIGGER FINGER RELEASE Right 09/16/2019   Procedure: RELEASE TRIGGER FINGER/A-1 PULLEY;  Surgeon: Daryll Brod, MD;  Location: [facility];  Service: Orthopedics;  Laterality: Right;  IV REGIONAL FOREARM BLOCK   Social History   Occupational History   Occupation: Engineer, maintenance (IT) taxes  Tobacco Use   Smoking status: Never   Smokeless tobacco: Never  Substance and Sexual Activity   Alcohol use: No   Drug use: No   Sexual activity: Yes

## 2022-02-02 ENCOUNTER — Ambulatory Visit (HOSPITAL_COMMUNITY): Payer: Medicare Other | Attending: Cardiology

## 2022-02-02 DIAGNOSIS — I350 Nonrheumatic aortic (valve) stenosis: Secondary | ICD-10-CM | POA: Diagnosis not present

## 2022-02-02 LAB — ECHOCARDIOGRAM COMPLETE
AR max vel: 2.64 cm2
AV Area VTI: 2.78 cm2
AV Area mean vel: 2.49 cm2
AV Mean grad: 10 mmHg
AV Peak grad: 17.3 mmHg
Ao pk vel: 2.08 m/s
Area-P 1/2: 1.78 cm2
P 1/2 time: 483 msec
S' Lateral: 3.8 cm

## 2022-02-07 ENCOUNTER — Encounter: Payer: Self-pay | Admitting: Thoracic Surgery (Cardiothoracic Vascular Surgery)

## 2022-02-07 ENCOUNTER — Institutional Professional Consult (permissible substitution) (INDEPENDENT_AMBULATORY_CARE_PROVIDER_SITE_OTHER): Payer: Medicare Other | Admitting: Thoracic Surgery (Cardiothoracic Vascular Surgery)

## 2022-02-07 VITALS — BP 160/79 | HR 65 | Resp 20 | Ht >= 80 in | Wt 240.0 lb

## 2022-02-07 DIAGNOSIS — I7121 Aneurysm of the ascending aorta, without rupture: Secondary | ICD-10-CM

## 2022-02-07 NOTE — Progress Notes (Signed)
PCP is Charlane Ferretti, MD Referring Provider is Jerline Pain, MD  Chief Complaint  Patient presents with   Thoracic Aortic Aneurysm    Surgical consult    HPI: [name] is sent for consultation regarding an ascending aortic aneurysm.  [name] is a 72 year old man with a past history significant for coronary disease, MI, stents, aortic valve sclerosis, first-degree AV block, trifascicular block, hypertension, hyperlipidemia, type 2 diabetes, morbid obesity status post gastric bypass, arthritis, sleep apnea, and hypothyroidism.  He is followed by Dr. Marlou Porch of cardiology.  In January 2023 he had an echocardiogram which showed 4.3 cm aortic root.  He return for a 1 year follow-up and had a CT angio of the chest along with a another echocardiogram.  CT angio of the chest showed an aneurysm of the ascending aorta and proximal arch measuring up to 5 cm near the takeoff of the innominate artery.  Echocardiogram showed preserved left ventricular function.  There was aortic valve calcification and sclerosis but no significant aortic stenosis.  He feels well.  He is not having any chest pain, pressure, tightness, or shortness of breath with exertion.  No swelling in his legs.  He did have a transmetatarsal amputation for a nonhealing ulcer on his left foot.  He is followed by Dr. Sharol Given for that.   Past Medical History:  Diagnosis Date   Anemia    low iron   Arthritis    Coronary artery disease 2006   a. remote stenting to ramus, prox LAD x2.   Dental crowns present    Diabetes mellitus    First degree AV block    GERD (gastroesophageal reflux disease)    Heart attack ([place]) 06/2004   Heart murmur    aortic   Hx MRSA infection 02/2006   Hyperlipidemia    Hypertension    hx. of - has not been on med. since losing wt. after gastric bypass   Hypothyroidism    Morbid obesity ([place])    Mucoid cyst of joint 09/2011   left thumb   Sleep apnea    sleep study 03/29/2011; no CPAP use, lost >130lbs    Trifascicular block     Past Surgical History:  Procedure Laterality Date   AMPUTATION Left 04/19/2016   Procedure: Left Foot 4th Toe Amputation vs. Transmetatarsal;  Surgeon: [name], MD;  Location: [place];  Service: Orthopedics;  Laterality: Left;   BIOPSY  04/07/2019   Procedure: BIOPSY;  Surgeon: Otis Brace, MD;  [facility];  Service: Gastroenterology;;   CATARACT EXTRACTION  02/2010; 03/2010   COLONOSCOPY WITH PROPOFOL N/A 09/14/2014   Procedure: COLONOSCOPY WITH PROPOFOL;  Surgeon: Garlan Fair, MD;  [facility];  Service: Endoscopy;  Laterality: N/A;   COLONOSCOPY WITH PROPOFOL N/A 04/07/2019   Procedure: COLONOSCOPY WITH PROPOFOL;  Surgeon: Otis Brace, MD;  [facility];  Service: Gastroenterology;  Laterality: N/A;   CORONARY ANGIOPLASTY WITH STENT PLACEMENT  07/12/2004; 08/03/2004   total of 3 stents   I & D EXTREMITY Right 06/04/2014   Procedure: IRRIGATION AND DEBRIDEMENT EXTREMITY;  Surgeon: [name], MD;  Location: [place];  Service: Orthopedics;  Laterality: Right;   MASS EXCISION  10/04/2011   Procedure: EXCISION MASS;  Surgeon: Wynonia Sours, MD;  Location: [place];  Service: Orthopedics;  Laterality: Left;  excision cyst debridment ip joint of left thumb   PROSTATECTOMY     right below knee amputation  2018  ROUX-EN-Y GASTRIC BYPASS  10/10/2010   laparoscopic   SHOULDER ARTHROSCOPY WITH ROTATOR CUFF REPAIR AND SUBACROMIAL DECOMPRESSION Right 03/23/2020   Procedure: RIGHT SHOULDER ARTHROSCOPY WITH DEBRIDEMENT AND  ROTATOR CUFF REPAIR;  Surgeon: [name], MD;  Location: [facility] [place];  Service: Orthopedics;  Laterality: Right;   TOE AMPUTATION  02/23/2006   left foot second ray amputation   TOTAL HIP ARTHROPLASTY Right 03/29/2020   Procedure: TOTAL HIP ARTHROPLASTY POSTERIOR APPROACH;  Surgeon: [name], MD;  Location: [place];  Service: Orthopedics;  Laterality: Right;   TRANSMETATARSAL  AMPUTATION Left    TRIGGER FINGER RELEASE Right 09/16/2019   Procedure: RELEASE TRIGGER FINGER/A-1 PULLEY;  Surgeon: Daryll Brod, MD;  Location: [place];  Service: Orthopedics;  Laterality: Right;  IV REGIONAL FOREARM BLOCK    Family History  Problem Relation Age of Onset   Heart disease Mother     Social History Social History   Tobacco Use   Smoking status: Never   Smokeless tobacco: Never  Substance Use Topics   Alcohol use: No   Drug use: No    Current Outpatient Medications  Medication Sig Dispense Refill   amoxicillin (AMOXIL) 500 MG capsule Take 4 pills one hour prior to dental work 8 capsule 2   aspirin EC 81 MG tablet Take 81 mg by mouth daily.     atorvastatin (LIPITOR) 10 MG tablet Take 10 mg by mouth at bedtime.      Calcium Citrate-Vitamin D (CALCIUM CITRATE CHEWY BITE PO) Take 500 mg by mouth 3 (three) times daily.     ferrous sulfate 325 (65 FE) MG tablet Take 325 mg by mouth daily.      fluticasone (FLONASE) 50 MCG/ACT nasal spray Place 2 sprays into both nostrils daily as needed for allergies.      gabapentin (NEURONTIN) 100 MG capsule Take 1 capsule (100 mg total) by mouth 3 (three) times daily. 90 capsule 1   Hypromellose (GENTEAL SEVERE OP) Place 1 drop into the right eye daily as needed (Dry eyes).     ketoconazole (NIZORAL) 2 % cream Apply 1 Application topically daily as needed for rash.     levothyroxine (SYNTHROID) 150 MCG tablet Take 150 mcg by mouth daily before breakfast.     lisinopril (ZESTRIL) 2.5 MG tablet TAKE 1 TABLET BY MOUTH EVERY DAY 90 tablet 3   metFORMIN (GLUCOPHAGE-XR) 500 MG 24 hr tablet Take 500 mg by mouth 2 (two) times daily.     Multiple Vitamin (MULTIVITAMIN WITH MINERALS) TABS tablet Take 1 tablet by mouth in the morning and at bedtime. Gummies (Patient taking differently: Take 2 tablets by mouth in the morning and at bedtime. Gummies)     nitroGLYCERIN (NITROSTAT) 0.4 MG SL tablet Place 1 tablet (0.4 mg total) under  the tongue every 5 (five) minutes x 3 doses as needed for chest pain. 25 tablet 3   Omega 3 1000 MG CAPS Take 1,000 mg by mouth daily.     Omeprazole 20 MG TBEC Take 20 mg by mouth daily as needed (acid reflux/ indigestion).      Valerian Root 450 MG CAPS Take 450 mg by mouth at bedtime.     vitamin B-12 (CYANOCOBALAMIN) 1000 MCG tablet Take 1,000 mcg by mouth 3 (three) times a week. Mon, wed, Friday     No current facility-administered medications for this visit.    Allergies  Allergen Reactions   Other Other (See Comments)   Moxifloxacin Rash  Avelox Rash at injection site   Penicillins Rash     Has patient had a PCN reaction causing immediate rash, facial/tongue/throat swelling, SOB or lightheadedness with hypotension: #  #  #  YES  #  #  #  Has patient had a PCN reaction causing severe rash involving mucus membranes or skin necrosis: No Has patient had a PCN reaction that required hospitalization unknown Has patient had a PCN reaction occurring within the last 10 years: No If all of the above answers are "NO", then may proceed with Cephalosporin use.   Quinolones Itching   Sulfamethoxazole-Trimethoprim Itching, Rash and Other (See Comments)    Review of Systems  Constitutional:  Negative for chills, fever and unexpected weight change.  HENT:  Negative for trouble swallowing and voice change.   Respiratory:  Negative for shortness of breath and wheezing.   Cardiovascular:  Negative for chest pain and leg swelling.  Genitourinary:  Negative for difficulty urinating and dysuria.  Musculoskeletal:        Left transmetatarsal amputation  Hematological:  Negative for adenopathy. Does not bruise/bleed easily.  All other systems reviewed and are negative.   BP (!) 160/79   Pulse 65   Resp 20   Ht '6\' 8"'$  (2.032 m)   Wt 240 lb (108.9 kg)   SpO2 98% Comment: RA  BMI 26.37 kg/m  Physical Exam Vitals reviewed.  Constitutional:      General: He is not in acute distress.     Appearance: Normal appearance.  HENT:     Head: Normocephalic and atraumatic.  Eyes:     General: No scleral icterus.    Extraocular Movements: Extraocular movements intact.  Neck:     Vascular: No carotid bruit.  Cardiovascular:     Rate and Rhythm: Normal rate and regular rhythm.     Heart sounds: Murmur (2/6 systolic) heard.     No friction rub. No gallop.  Pulmonary:     Effort: Pulmonary effort is normal. No respiratory distress.     Breath sounds: Normal breath sounds. No wheezing or rales.  Abdominal:     General: There is no distension.     Palpations: Abdomen is soft. There is no mass.     Tenderness: There is no abdominal tenderness.  Musculoskeletal:     Cervical back: Neck supple.  Skin:    General: Skin is warm and dry.  Neurological:     General: No focal deficit present.     Mental Status: He is alert and oriented to person, place, and time.     Cranial Nerves: No cranial nerve deficit.     Motor: No weakness.     Diagnostic Tests: CT ANGIOGRAPHY CHEST WITH CONTRAST   TECHNIQUE: Multidetector CT imaging of the chest was performed using the standard protocol during bolus administration of intravenous contrast. Multiplanar CT image reconstructions and MIPs were obtained to evaluate the vascular anatomy.   RADIATION DOSE REDUCTION: This exam was performed according to the departmental dose-optimization program which includes automated exposure control, adjustment of the mA and/or kV according to patient size and/or use of iterative reconstruction technique.   CONTRAST:  95m OMNIPAQUE IOHEXOL 350 MG/ML SOLN   COMPARISON:  None Available.   FINDINGS: Cardiovascular: Heart size normal. No pericardial effusion. Incomplete opacification of the pulmonary arterial tree, without convincing filling defect to suggest acute PE. Coarse calcifications involving aortic valve leaflets. Extensive coronary calcifications. Good contrast opacification of the thoracic  aorta without dissection or stenosis. Classic  3 vessel brachiocephalic arterial origin anatomy without proximal stenosis. Minimal calcified plaque in the descending thoracic aorta and visualized proximal abdominal aorta which is nondilated.   Aortic Root:   --Valve: 3.9 cm   --Sinuses: 4.2 cm   --Sinotubular Junction: 3.4 cm   Limitations by motion: Moderate   Thoracic Aorta:   --Ascending Aorta: 5 cm   --Aortic Arch: 4.4 cm   --Descending Aorta: 3.2 cm   Mediastinum/Nodes: No mediastinal hematoma, mass or adenopathy.   Lungs/Pleura: No pleural effusion. No pneumothorax. Minimal dependent atelectasis posteriorly in the lower lobes. 5 mm noncalcified nodule, right middle lobe (Im85,Se6) .   Upper Abdomen: Changes of gastric bypass surgery with small hiatal hernia. No acute findings. Subcentimeter calcified stones in the nondistended gall bladder.   Musculoskeletal: Mild spondylitic changes and anterior vertebral endplate spurring at multiple levels in the mid and lower thoracic spine. No acute findings.   Review of the MIP images confirms the above findings.   IMPRESSION: 1. 5 cm ascending thoracic aortic aneurysm. Recommend semi-annual imaging followup by CTA or MRA and referral to cardiothoracic surgery if not already obtained. This recommendation follows 2010 ACCF/AHA/AATS/ACR/ASA/SCA/SCAI/SIR/STS/SVM Guidelines for the Diagnosis and Management of Patients With Thoracic Aortic Disease. Circulation. 2010; 121: e266-e369 2. 5 mm right middle lobe pulmonary nodule. Per Fleischner Society Guidelines, no routine follow-up imaging is recommended. These guidelines do not apply to immunocompromised patients and patients with cancer. Follow up in patients with significant comorbidities as clinically warranted. For lung cancer screening, adhere to Lung-RADS guidelines. Reference: Radiology. 2017; 284(1):228-43. 3. Cholelithiasis. 4. Changes of gastric bypass surgery with  small hiatal hernia. 5.  Coronary artery disease.     Electronically Signed   By: Lucrezia Europe M.D.   On: 12/30/2021 15:58 I personally reviewed the CT images.  The aortic root is normal.  Just above the sinotubular junction there is aneurysmal dilatation.  Maximal size about 5 cm near the takeoff of the innominate artery.  Tapers to the arch and descending aorta is normal.  Impression: [name] is a 72 year old man with a past history significant for coronary disease, MI, stents, aortic valve sclerosis, first-degree AV block, trifascicular block, hypertension, hyperlipidemia, type 2 diabetes, morbid obesity status post gastric bypass, arthritis, sleep apnea, and hypothyroidism.  Noted to have a dilated ascending aorta on echocardiogram.  CT chest showed an ascending/proximal arch aneurysm.  Ascending thoracic aortic/proximal arch aneurysm-measures 5 cm.  I had a long discussion with Mr. and Mrs. Dettmer regarding indications for surgery for aneurysm from this location.  Typically that starts at about 5.5 cm.  In his case he is larger than the average person.  Still would need to consider surgery if the aneurysm gets to that size.  Rapid growth defined as 5 mm in 6 months would be an indication for surgery as well.  For now the risk of the operation exceeds the risk of the aneurysm so the recommendation is radiographic follow-up.  Hypertension-I discussed the importance of blood pressure control to avoid undue stress on the aortic wall.  His blood pressure was elevated today at 147 systolic.  Blood pressure was 829 systolic when he saw Dr. Marlou Porch a month ago.  Difference is likely somewhat attributed to whitecoat hypertension.  He was very anxious that he needed surgery for the aneurysm.  Has a cuff at home but does not use it regularly.   I recommended that he check his blood pressure on a regular basis.  Goal is less than 130  systolic.  He has an allergy to moxifloxacin and quinolones.  Emphasized the  importance of not taking his medications with an aneurysm.  Also advised him to avoid heavy lifting as that can cause sudden changes in blood pressure.  Plan: Monitor blood pressure on a regular basis No quinolone antibiotics No lifting over 50 pounds Return in 6 months with CT angio of chest to follow-up ascending aneurysm  I spent 35 minutes in review of records, images, and in consultation with Mr. Coate today. Melrose Nakayama, MD [facility] [phone]

## 2022-02-23 ENCOUNTER — Ambulatory Visit (INDEPENDENT_AMBULATORY_CARE_PROVIDER_SITE_OTHER): Payer: Medicare Other | Admitting: Orthopedic Surgery

## 2022-02-23 DIAGNOSIS — L97521 Non-pressure chronic ulcer of other part of left foot limited to breakdown of skin: Secondary | ICD-10-CM

## 2022-02-23 DIAGNOSIS — Z89432 Acquired absence of left foot: Secondary | ICD-10-CM

## 2022-02-23 MED ORDER — GABAPENTIN 100 MG PO CAPS: 100.0000 mg | ORAL_CAPSULE | Freq: Four times a day (QID) | ORAL | 3 refills | Status: DC

## 2022-02-24 ENCOUNTER — Encounter: Payer: Self-pay | Admitting: Orthopedic Surgery

## 2022-02-24 NOTE — Progress Notes (Signed)
Office Visit Note   Patient: [name]           Date of Birth: [birth year]           MRN: [id number] Visit Date: 02/23/2022              Requested by: Charlane Ferretti, MD [address] [place],  [place] [id number] PCP: Charlane Ferretti, MD  Chief Complaint  Patient presents with   Left Foot - Wound Check    Hx transmet amputation      HPI: Patient is a 72 year old gentleman status post left transmetatarsal amputation with a Wagner grade 1 ulcer.  Patient currently uses Neurontin 400 mg a day.  Assessment & Plan: Visit Diagnoses:  1. Non-pressure chronic ulcer of other part of left foot limited to breakdown of skin ([place])   2. History of transmetatarsal amputation of left foot ([place])     Plan: A prescription for Neurontin was called in.  A felt relieving donut was cut out to offload pressure from the transmetatarsal amputation.  Follow-Up Instructions: Return in about 4 weeks (around 03/23/2022).   Ortho Exam  Patient is alert, oriented, no adenopathy, well-dressed, normal affect, normal respiratory effort. Examination patient has a palpable pulse.  His foot is plantigrade there is no redness or cellulitis.  He has a Wagner grade 1 ulcer.  After informed consent a 10 blade knife was used to debride the skin and soft tissue back to healthy viable granulation tissue.  The ulcer was 3 cm in diameter and 2 mm deep after debridement there is no tunneling.  Silver nitrate was used for hemostasis.  Patient does have custom orthotics with a spacer.  Imaging: No results found. No images are attached to the encounter.  Labs: Lab Results  Component Value Date   HGBA1C 6.8 (H) 03/28/2020   HGBA1C 6.5 (H) 09/26/2015   HGBA1C 7.0 (H) 06/04/2014   REPTSTATUS 08/01/2016 FINAL 07/30/2016   GRAMSTAIN  06/04/2014    MODERATE WBC PRESENT, PREDOMINANTLY PMN NO SQUAMOUS EPITHELIAL CELLS SEEN MODERATE GRAM POSITIVE COCCI IN CLUSTERS Performed at [facility]   06/04/2014    MODERATE WBC PRESENT, PREDOMINANTLY PMN NO SQUAMOUS EPITHELIAL CELLS SEEN MODERATE GRAM POSITIVE COCCI IN PAIRS Performed at [facility] [facility]  07/30/2016    NO GROWTH Performed at [facility] [address] [place], [place] [address]      Lab Results  Component Value Date   ALBUMIN 3.9 08/08/2021   ALBUMIN 3.5 03/28/2020   ALBUMIN 3.5 03/28/2020    Lab Results  Component Value Date   MG 1.8 08/08/2021   MG 2.0 04/02/2020   Lab Results  Component Value Date   VD25OH 53.03 03/28/2020    No results found for: "PREALBUMIN"    Latest Ref Rng & Units 08/08/2021    2:50 AM 04/01/2020    3:31 AM 03/31/2020    5:23 AM  CBC EXTENDED  WBC 4.0 - 10.5 K/uL 6.8  8.6  9.0   RBC 4.22 - 5.81 MIL/uL 5.17  3.92  3.98   Hemoglobin 13.0 - 17.0 g/dL 15.9  12.3  12.5   HCT 39.0 - 52.0 % 47.2  36.0  36.0   Platelets 150 - 400 K/uL 197  205  181   NEUT# 1.7 - 7.7 K/uL 4.4     Lymph# 0.7 - 4.0 K/uL 1.3  There is no height or weight on file to calculate BMI.  Orders:  No orders of the defined types were placed in this encounter.  Meds ordered this encounter  Medications   gabapentin (NEURONTIN) 100 MG capsule    Sig: Take 1 capsule (100 mg total) by mouth 4 (four) times daily. When necessary for neuropathy pain    Dispense:  120 capsule    Refill:  3     Procedures: No procedures performed  Clinical Data: No additional findings.  ROS:  All other systems negative, except as noted in the HPI. Review of Systems  Objective: Vital Signs: There were no vitals taken for this visit.  Specialty Comments:  No specialty comments available.  PMFS History: Patient Active Problem List   Diagnosis Date Noted   Hypotension 01/07/2021   S/P arthroscopy of right shoulder 06/02/2020   Closed fracture of neck of right femur ([place])    Hip fracture ([place]) 03/27/2020   Nontraumatic complete tear of right rotator cuff 02/26/2020   S/P  transmetatarsal amputation of foot, left ([place]) 04/25/2016   Chronic osteomyelitis of toe, left ([facility])    Chronic diastolic CHF (congestive heart failure) ([place]) 09/25/2015   Aortic stenosis 08/04/2014   Diabetic foot infection ([place]) 06/03/2014   Peripheral vascular disease ([place]) 06/03/2014   Coronary artery disease involving native coronary artery of native heart without angina pectoris 07/29/2013   Trifascicular block 07/29/2013   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 07/26/2011   Hypothyroidism 01/22/2007   Type 2 diabetes mellitus with vascular disease ([place]) 01/22/2007   HYPERLIPIDEMIA, MIXED 01/22/2007   MYOCARDIAL INFARCTION, HX OF 01/22/2007   ANEMIA, IRON DEFICIENCY, HX OF 01/22/2007   Past Medical History:  Diagnosis Date   Anemia    low iron   Arthritis    Coronary artery disease 2006   a. remote stenting to ramus, prox LAD x2.   Dental crowns present    Diabetes mellitus    First degree AV block    GERD (gastroesophageal reflux disease)    Heart attack ([place]) 06/2004   Heart murmur    aortic   Hx MRSA infection 02/2006   Hyperlipidemia    Hypertension    hx. of - has not been on med. since losing wt. after gastric bypass   Hypothyroidism    Morbid obesity ([place])    Mucoid cyst of joint 09/2011   left thumb   Sleep apnea    sleep study 03/29/2011; no CPAP use, lost >130lbs   Trifascicular block     Family History  Problem Relation Age of Onset   Heart disease Mother     Past Surgical History:  Procedure Laterality Date   AMPUTATION Left 04/19/2016   Procedure: Left Foot 4th Toe Amputation vs. Transmetatarsal;  Surgeon: [name], MD;  Location: [facility] [place];  Service: Orthopedics;  Laterality: Left;   BIOPSY  04/07/2019   Procedure: BIOPSY;  Surgeon: Otis Brace, MD;  [facility];  Service: Gastroenterology;;   CATARACT EXTRACTION  02/2010; 03/2010   COLONOSCOPY WITH PROPOFOL N/A 09/14/2014   Procedure: COLONOSCOPY WITH PROPOFOL;  Surgeon: Garlan Fair, MD;  [facility];  Service: Endoscopy;  Laterality: N/A;   COLONOSCOPY WITH PROPOFOL N/A 04/07/2019   Procedure: COLONOSCOPY WITH PROPOFOL;  Surgeon: Otis Brace, MD;  [facility];  Service: Gastroenterology;  Laterality: N/A;   CORONARY ANGIOPLASTY WITH STENT PLACEMENT  07/12/2004; 08/03/2004   total of 3 stents   I &  D EXTREMITY Right 06/04/2014   Procedure: IRRIGATION AND DEBRIDEMENT EXTREMITY;  Surgeon: [name], MD;  Location: [place];  Service: Orthopedics;  Laterality: Right;   MASS EXCISION  10/04/2011   Procedure: EXCISION MASS;  Surgeon: Wynonia Sours, MD;  Location: [facility];  Service: Orthopedics;  Laterality: Left;  excision cyst debridment ip joint of left thumb   PROSTATECTOMY     right below knee amputation  2018   ROUX-EN-Y GASTRIC BYPASS  10/10/2010   laparoscopic   SHOULDER ARTHROSCOPY WITH ROTATOR CUFF REPAIR AND SUBACROMIAL DECOMPRESSION Right 03/23/2020   Procedure: RIGHT SHOULDER ARTHROSCOPY WITH DEBRIDEMENT AND  ROTATOR CUFF REPAIR;  Surgeon: [name], MD;  Location: [place] [facility];  Service: Orthopedics;  Laterality: Right;   TOE AMPUTATION  02/23/2006   left foot second ray amputation   TOTAL HIP ARTHROPLASTY Right 03/29/2020   Procedure: TOTAL HIP ARTHROPLASTY POSTERIOR APPROACH;  Surgeon: [name], MD;  Location: [place];  Service: Orthopedics;  Laterality: Right;   TRANSMETATARSAL AMPUTATION Left    TRIGGER FINGER RELEASE Right 09/16/2019   Procedure: RELEASE TRIGGER FINGER/A-1 PULLEY;  Surgeon: Daryll Brod, MD;  Location: [place];  Service: Orthopedics;  Laterality: Right;  IV REGIONAL FOREARM BLOCK   Social History   Occupational History   Occupation: Engineer, maintenance (IT) taxes  Tobacco Use   Smoking status: Never   Smokeless tobacco: Never  Substance and Sexual Activity   Alcohol use: No   Drug use: No   Sexual activity: Yes

## 2022-03-08 DIAGNOSIS — Z9841 Cataract extraction status, right eye: Secondary | ICD-10-CM | POA: Diagnosis not present

## 2022-03-08 DIAGNOSIS — H04123 Dry eye syndrome of bilateral lacrimal glands: Secondary | ICD-10-CM | POA: Diagnosis not present

## 2022-03-08 DIAGNOSIS — Z7984 Long term (current) use of oral hypoglycemic drugs: Secondary | ICD-10-CM | POA: Diagnosis not present

## 2022-03-08 DIAGNOSIS — H01006 Unspecified blepharitis left eye, unspecified eyelid: Secondary | ICD-10-CM | POA: Diagnosis not present

## 2022-03-08 DIAGNOSIS — H02834 Dermatochalasis of left upper eyelid: Secondary | ICD-10-CM | POA: Diagnosis not present

## 2022-03-08 DIAGNOSIS — Z880 Allergy status to penicillin: Secondary | ICD-10-CM | POA: Diagnosis not present

## 2022-03-08 DIAGNOSIS — H02831 Dermatochalasis of right upper eyelid: Secondary | ICD-10-CM | POA: Diagnosis not present

## 2022-03-08 DIAGNOSIS — H35371 Puckering of macula, right eye: Secondary | ICD-10-CM | POA: Diagnosis not present

## 2022-03-08 DIAGNOSIS — Z9842 Cataract extraction status, left eye: Secondary | ICD-10-CM | POA: Diagnosis not present

## 2022-03-08 DIAGNOSIS — Z9889 Other specified postprocedural states: Secondary | ICD-10-CM | POA: Diagnosis not present

## 2022-03-08 DIAGNOSIS — Z961 Presence of intraocular lens: Secondary | ICD-10-CM | POA: Diagnosis not present

## 2022-03-08 DIAGNOSIS — H01003 Unspecified blepharitis right eye, unspecified eyelid: Secondary | ICD-10-CM | POA: Diagnosis not present

## 2022-03-08 DIAGNOSIS — Z881 Allergy status to other antibiotic agents status: Secondary | ICD-10-CM | POA: Diagnosis not present

## 2022-03-08 DIAGNOSIS — E119 Type 2 diabetes mellitus without complications: Secondary | ICD-10-CM | POA: Diagnosis not present

## 2022-03-08 DIAGNOSIS — Z882 Allergy status to sulfonamides status: Secondary | ICD-10-CM | POA: Diagnosis not present

## 2022-03-08 DIAGNOSIS — H5704 Mydriasis: Secondary | ICD-10-CM | POA: Diagnosis not present

## 2022-03-10 DIAGNOSIS — E669 Obesity, unspecified: Secondary | ICD-10-CM | POA: Diagnosis not present

## 2022-03-10 DIAGNOSIS — N5201 Erectile dysfunction due to arterial insufficiency: Secondary | ICD-10-CM | POA: Diagnosis not present

## 2022-03-10 DIAGNOSIS — I2510 Atherosclerotic heart disease of native coronary artery without angina pectoris: Secondary | ICD-10-CM | POA: Diagnosis not present

## 2022-03-10 DIAGNOSIS — I10 Essential (primary) hypertension: Secondary | ICD-10-CM | POA: Diagnosis not present

## 2022-03-10 DIAGNOSIS — I7121 Aneurysm of the ascending aorta, without rupture: Secondary | ICD-10-CM | POA: Diagnosis not present

## 2022-03-10 DIAGNOSIS — M8589 Other specified disorders of bone density and structure, multiple sites: Secondary | ICD-10-CM | POA: Diagnosis not present

## 2022-03-10 DIAGNOSIS — Z8546 Personal history of malignant neoplasm of prostate: Secondary | ICD-10-CM | POA: Diagnosis not present

## 2022-03-10 DIAGNOSIS — Z1331 Encounter for screening for depression: Secondary | ICD-10-CM | POA: Diagnosis not present

## 2022-03-10 DIAGNOSIS — Z8639 Personal history of other endocrine, nutritional and metabolic disease: Secondary | ICD-10-CM | POA: Diagnosis not present

## 2022-03-10 DIAGNOSIS — E039 Hypothyroidism, unspecified: Secondary | ICD-10-CM | POA: Diagnosis not present

## 2022-03-10 DIAGNOSIS — E1149 Type 2 diabetes mellitus with other diabetic neurological complication: Secondary | ICD-10-CM | POA: Diagnosis not present

## 2022-03-10 DIAGNOSIS — Z89511 Acquired absence of right leg below knee: Secondary | ICD-10-CM | POA: Diagnosis not present

## 2022-03-10 DIAGNOSIS — Z0000 Encounter for general adult medical examination without abnormal findings: Secondary | ICD-10-CM | POA: Diagnosis not present

## 2022-03-10 DIAGNOSIS — E7800 Pure hypercholesterolemia, unspecified: Secondary | ICD-10-CM | POA: Diagnosis not present

## 2022-03-17 DIAGNOSIS — Z23 Encounter for immunization: Secondary | ICD-10-CM | POA: Diagnosis not present

## 2022-03-20 DIAGNOSIS — B0052 Herpesviral keratitis: Secondary | ICD-10-CM | POA: Diagnosis not present

## 2022-03-20 DIAGNOSIS — Z961 Presence of intraocular lens: Secondary | ICD-10-CM | POA: Diagnosis not present

## 2022-03-20 DIAGNOSIS — E119 Type 2 diabetes mellitus without complications: Secondary | ICD-10-CM | POA: Diagnosis not present

## 2022-03-20 DIAGNOSIS — Z7984 Long term (current) use of oral hypoglycemic drugs: Secondary | ICD-10-CM | POA: Diagnosis not present

## 2022-03-20 DIAGNOSIS — Z9889 Other specified postprocedural states: Secondary | ICD-10-CM | POA: Diagnosis not present

## 2022-03-23 ENCOUNTER — Ambulatory Visit: Payer: Medicare Other | Admitting: Orthopedic Surgery

## 2022-03-29 DIAGNOSIS — E119 Type 2 diabetes mellitus without complications: Secondary | ICD-10-CM | POA: Diagnosis not present

## 2022-03-29 DIAGNOSIS — Z961 Presence of intraocular lens: Secondary | ICD-10-CM | POA: Diagnosis not present

## 2022-03-29 DIAGNOSIS — Z9889 Other specified postprocedural states: Secondary | ICD-10-CM | POA: Diagnosis not present

## 2022-03-29 DIAGNOSIS — B0052 Herpesviral keratitis: Secondary | ICD-10-CM | POA: Diagnosis not present

## 2022-03-30 ENCOUNTER — Ambulatory Visit (INDEPENDENT_AMBULATORY_CARE_PROVIDER_SITE_OTHER): Payer: Medicare Other | Admitting: Orthopedic Surgery

## 2022-03-30 DIAGNOSIS — Z89432 Acquired absence of left foot: Secondary | ICD-10-CM | POA: Diagnosis not present

## 2022-03-30 DIAGNOSIS — L97521 Non-pressure chronic ulcer of other part of left foot limited to breakdown of skin: Secondary | ICD-10-CM | POA: Diagnosis not present

## 2022-04-03 ENCOUNTER — Encounter: Payer: Self-pay | Admitting: Orthopedic Surgery

## 2022-04-03 NOTE — Progress Notes (Signed)
Office Visit Note   Patient: [name]           Date of Birth: 1950/07/30           MRN: [id number] Visit Date: 03/30/2022              Requested by: Charlane Ferretti, MD [address] [place],  [place] [address] PCP: Charlane Ferretti, MD  Chief Complaint  Patient presents with   Left Foot - Wound Check      HPI: Patient is a 72 year old gentleman with a Wagner grade 1 ulcer left foot status post transmetatarsal amputation.  Patient has a right transtibial amputation with a vacuum pin liner.  Assessment & Plan: Visit Diagnoses:  1. Non-pressure chronic ulcer of other part of left foot limited to breakdown of skin ([place])   2. History of transmetatarsal amputation of left foot ([place])     Plan: Patient is to follow-up with [facility] for a new socket and pen liner.  The Kerecis shield was applied follow-up in 1 week to change the dressing.  Follow-Up Instructions: Return in about 1 week (around 04/06/2022).   Ortho Exam  Patient is alert, oriented, no adenopathy, well-dressed, normal affect, normal respiratory effort. Examination patient has a Wagner grade 1 ulcer on the residual limb.  After informed consent a 10 blade knife was used debride the skin and soft tissue back to healthy viable tissue on the left foot.  The wound measures 2 x 2 cm after debridement and is 3 mm deep.  There is no exposed bone or tendon no abscess.  Silver nitrate was used hemostasis.  After debridement the ulcer back to healthy viable tissue the Kerecis shield was applied 8 total units.  A code 2019.  Imaging: No results found.     Labs: Lab Results  Component Value Date   HGBA1C 6.8 (H) 03/28/2020   HGBA1C 6.5 (H) 09/26/2015   HGBA1C 7.0 (H) 06/04/2014   REPTSTATUS 08/01/2016 FINAL 07/30/2016   GRAMSTAIN  06/04/2014    MODERATE WBC PRESENT, PREDOMINANTLY PMN NO SQUAMOUS EPITHELIAL CELLS SEEN MODERATE GRAM POSITIVE COCCI IN CLUSTERS Performed at [facility]   06/04/2014    MODERATE WBC PRESENT, PREDOMINANTLY PMN NO SQUAMOUS EPITHELIAL CELLS SEEN MODERATE GRAM POSITIVE COCCI IN PAIRS Performed at [facility]  07/30/2016    NO GROWTH Performed at [facility] [address] [place], [place] [address]      Lab Results  Component Value Date   ALBUMIN 3.9 08/08/2021   ALBUMIN 3.5 03/28/2020   ALBUMIN 3.5 03/28/2020    Lab Results  Component Value Date   MG 1.8 08/08/2021   MG 2.0 04/02/2020   Lab Results  Component Value Date   VD25OH 53.03 03/28/2020    No results found for: "PREALBUMIN"    Latest Ref Rng & Units 08/08/2021    2:50 AM 04/01/2020    3:31 AM 03/31/2020    5:23 AM  CBC EXTENDED  WBC 4.0 - 10.5 K/uL 6.8  8.6  9.0   RBC 4.22 - 5.81 MIL/uL 5.17  3.92  3.98   Hemoglobin 13.0 - 17.0 g/dL 15.9  12.3  12.5   HCT 39.0 - 52.0 % 47.2  36.0  36.0   Platelets 150 - 400 K/uL 197  205  181   NEUT# 1.7 - 7.7 K/uL 4.4     Lymph# 0.7 - 4.0 K/uL  1.3        There is no height or weight on file to calculate BMI.  Orders:  No orders of the defined types were placed in this encounter.  No orders of the defined types were placed in this encounter.    Procedures: No procedures performed  Clinical Data: No additional findings.  ROS:  All other systems negative, except as noted in the HPI. Review of Systems  Objective: Vital Signs: There were no vitals taken for this visit.  Specialty Comments:  No specialty comments available.  PMFS History: Patient Active Problem List   Diagnosis Date Noted   Hypotension 01/07/2021   S/P arthroscopy of right shoulder 06/02/2020   Closed fracture of neck of right femur ([place])    Hip fracture ([place]) 03/27/2020   Nontraumatic complete tear of right rotator cuff 02/26/2020   S/P transmetatarsal amputation of foot, left ([place]) 04/25/2016   Chronic osteomyelitis of toe, left (HCC)    Chronic diastolic CHF (congestive heart failure) ([place]) 09/25/2015   Aortic  stenosis 08/04/2014   Diabetic foot infection ([place]) 06/03/2014   Peripheral vascular disease ([place]) 06/03/2014   Coronary artery disease involving native coronary artery of native heart without angina pectoris 07/29/2013   Trifascicular block 07/29/2013   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 07/26/2011   Hypothyroidism 01/22/2007   Type 2 diabetes mellitus with vascular disease ([place]) 01/22/2007   HYPERLIPIDEMIA, MIXED 01/22/2007   MYOCARDIAL INFARCTION, HX OF 01/22/2007   ANEMIA, IRON DEFICIENCY, HX OF 01/22/2007   Past Medical History:  Diagnosis Date   Anemia    low iron   Arthritis    Coronary artery disease 2006   a. remote stenting to ramus, prox LAD x2.   Dental crowns present    Diabetes mellitus    First degree AV block    GERD (gastroesophageal reflux disease)    Heart attack ([place]) 06/2004   Heart murmur    aortic   Hx MRSA infection 02/2006   Hyperlipidemia    Hypertension    hx. of - has not been on med. since losing wt. after gastric bypass   Hypothyroidism    Morbid obesity ([place])    Mucoid cyst of joint 09/2011   left thumb   Sleep apnea    sleep study 03/29/2011; no CPAP use, lost >130lbs   Trifascicular block     Family History  Problem Relation Age of Onset   Heart disease Mother     Past Surgical History:  Procedure Laterality Date   AMPUTATION Left 04/19/2016   Procedure: Left Foot 4th Toe Amputation vs. Transmetatarsal;  Surgeon: [name], MD;  Location: [facility];  Service: Orthopedics;  Laterality: Left;   BIOPSY  04/07/2019   Procedure: BIOPSY;  Surgeon: Otis Brace, MD;  [facility];  Service: Gastroenterology;;   CATARACT EXTRACTION  02/2010; 03/2010   COLONOSCOPY WITH PROPOFOL N/A 09/14/2014   Procedure: COLONOSCOPY WITH PROPOFOL;  Surgeon: Garlan Fair, MD;  [facility];  Service: Endoscopy;  Laterality: N/A;   COLONOSCOPY WITH PROPOFOL N/A 04/07/2019   Procedure: COLONOSCOPY WITH PROPOFOL;  Surgeon: Otis Brace, MD;  [facility];  Service: Gastroenterology;  Laterality: N/A;   CORONARY ANGIOPLASTY WITH STENT PLACEMENT  07/12/2004; 08/03/2004   total of 3 stents   I & D EXTREMITY Right 06/04/2014   Procedure: IRRIGATION AND DEBRIDEMENT EXTREMITY;  Surgeon: [name], MD;  Location: [facility];  Service: Orthopedics;  Laterality: Right;  MASS EXCISION  10/04/2011   Procedure: EXCISION MASS;  Surgeon: Wynonia Sours, MD;  Location: [facility];  Service: Orthopedics;  Laterality: Left;  excision cyst debridment ip joint of left thumb   PROSTATECTOMY     right below knee amputation  2018   ROUX-EN-Y GASTRIC BYPASS  10/10/2010   laparoscopic   SHOULDER ARTHROSCOPY WITH ROTATOR CUFF REPAIR AND SUBACROMIAL DECOMPRESSION Right 03/23/2020   Procedure: RIGHT SHOULDER ARTHROSCOPY WITH DEBRIDEMENT AND  ROTATOR CUFF REPAIR;  Surgeon: [name], MD;  Location: [facility];  Service: Orthopedics;  Laterality: Right;   TOE AMPUTATION  02/23/2006   left foot second ray amputation   TOTAL HIP ARTHROPLASTY Right 03/29/2020   Procedure: TOTAL HIP ARTHROPLASTY POSTERIOR APPROACH;  Surgeon: [name], MD;  Location: [facility];  Service: Orthopedics;  Laterality: Right;   TRANSMETATARSAL AMPUTATION Left    TRIGGER FINGER RELEASE Right 09/16/2019   Procedure: RELEASE TRIGGER FINGER/A-1 PULLEY;  Surgeon: Daryll Brod, MD;  Location: [facility];  Service: Orthopedics;  Laterality: Right;  IV REGIONAL FOREARM BLOCK   Social History   Occupational History   Occupation: Engineer, maintenance (IT) taxes  Tobacco Use   Smoking status: Never   Smokeless tobacco: Never  Substance and Sexual Activity   Alcohol use: No   Drug use: No   Sexual activity: Yes

## 2022-04-05 DIAGNOSIS — H04123 Dry eye syndrome of bilateral lacrimal glands: Secondary | ICD-10-CM | POA: Diagnosis not present

## 2022-04-05 DIAGNOSIS — Z9889 Other specified postprocedural states: Secondary | ICD-10-CM | POA: Diagnosis not present

## 2022-04-05 DIAGNOSIS — H168 Other keratitis: Secondary | ICD-10-CM | POA: Diagnosis not present

## 2022-04-05 DIAGNOSIS — E119 Type 2 diabetes mellitus without complications: Secondary | ICD-10-CM | POA: Diagnosis not present

## 2022-04-05 DIAGNOSIS — Z7984 Long term (current) use of oral hypoglycemic drugs: Secondary | ICD-10-CM | POA: Diagnosis not present

## 2022-04-06 ENCOUNTER — Ambulatory Visit (INDEPENDENT_AMBULATORY_CARE_PROVIDER_SITE_OTHER): Payer: Medicare Other | Admitting: Orthopedic Surgery

## 2022-04-06 DIAGNOSIS — S88111A Complete traumatic amputation at level between knee and ankle, right lower leg, initial encounter: Secondary | ICD-10-CM

## 2022-04-06 DIAGNOSIS — Z89432 Acquired absence of left foot: Secondary | ICD-10-CM | POA: Diagnosis not present

## 2022-04-06 DIAGNOSIS — L97521 Non-pressure chronic ulcer of other part of left foot limited to breakdown of skin: Secondary | ICD-10-CM

## 2022-04-06 DIAGNOSIS — Z89511 Acquired absence of right leg below knee: Secondary | ICD-10-CM | POA: Diagnosis not present

## 2022-04-10 ENCOUNTER — Encounter: Payer: Self-pay | Admitting: Orthopedic Surgery

## 2022-04-10 NOTE — Progress Notes (Signed)
Office Visit Note   Patient: [name]           Date of Birth: 19-Aug-1950           MRN: [id number] Visit Date: 04/06/2022              Requested by: Charlane Ferretti, MD [address] [place],  [place] [address] PCP: Charlane Ferretti, MD  Chief Complaint  Patient presents with   Left Foot - Wound Check    Hx transmet amputation.  Kerecis Shield applied on 03/30/22 in office.      HPI: Patient is a 72 year old gentleman is seen for 2 separate issues.  #1 patient will need a new suction socket for the right lower extremity #2 patient is seen in follow-up for application of the Kerecis shield left transmetatarsal amputation.  Assessment & Plan: Visit Diagnoses:  1. Non-pressure chronic ulcer of other part of left foot limited to breakdown of skin ([place])   2. History of transmetatarsal amputation of left foot ([place])   3. Below-knee amputation of right lower extremity ([place])     Plan: Prescription provided for a new suction socket and pending for the the below-knee amputation.  Patient was provided a prescription for stump shrinker.  Continue with routine wound care left transmetatarsal amputation may require additional application of Kerecis shield at follow-up.  Follow-Up Instructions: Return in about 1 week (around 04/13/2022).   Ortho Exam  Patient is alert, oriented, no adenopathy, well-dressed, normal affect, normal respiratory effort. Examination the wound bed shows improved granulation tissue.  The ulcer measures 10 x 15 mm.  There is healthy granulation tissue.  Patient is an existing right transtibial  amputee.  Patient's current comorbidities are not expected to impact the ability to function with the prescribed prosthesis. Patient verbally communicates a strong desire to use a prosthesis. Patient currently requires mobility aids to ambulate without a prosthesis.  Expects not to use mobility aids with a new prosthesis.  Patient is a K3 level ambulator that spends  a lot of time walking around on uneven terrain over obstacles, up and down stairs, and ambulates with a variable cadence.  Patient will require a suction and pin socket.   Imaging: No results found.   Labs: Lab Results  Component Value Date   HGBA1C 6.8 (H) 03/28/2020   HGBA1C 6.5 (H) 09/26/2015   HGBA1C 7.0 (H) 06/04/2014   REPTSTATUS 08/01/2016 FINAL 07/30/2016   GRAMSTAIN  06/04/2014    MODERATE WBC PRESENT, PREDOMINANTLY PMN NO SQUAMOUS EPITHELIAL CELLS SEEN MODERATE GRAM POSITIVE COCCI IN CLUSTERS Performed at [facility]  06/04/2014    MODERATE WBC PRESENT, PREDOMINANTLY PMN NO SQUAMOUS EPITHELIAL CELLS SEEN MODERATE GRAM POSITIVE COCCI IN PAIRS Performed at [facility]  07/30/2016    NO GROWTH Performed at [facility] [address] [place], [place] [address]      Lab Results  Component Value Date   ALBUMIN 3.9 08/08/2021   ALBUMIN 3.5 03/28/2020   ALBUMIN 3.5 03/28/2020    Lab Results  Component Value Date   MG 1.8 08/08/2021   MG 2.0 04/02/2020   Lab Results  Component Value Date   VD25OH 53.03 03/28/2020    No results found for: "PREALBUMIN"    Latest Ref Rng & Units 08/08/2021    2:50 AM 04/01/2020    3:31 AM 03/31/2020    5:23 AM  CBC EXTENDED  WBC 4.0 - 10.5 K/uL 6.8  8.6  9.0   RBC 4.22 - 5.81 MIL/uL 5.17  3.92  3.98   Hemoglobin 13.0 - 17.0 g/dL 15.9  12.3  12.5   HCT 39.0 - 52.0 % 47.2  36.0  36.0   Platelets 150 - 400 K/uL 197  205  181   NEUT# 1.7 - 7.7 K/uL 4.4     Lymph# 0.7 - 4.0 K/uL 1.3        There is no height or weight on file to calculate BMI.  Orders:  No orders of the defined types were placed in this encounter.  No orders of the defined types were placed in this encounter.    Procedures: No procedures performed  Clinical Data: No additional findings.  ROS:  All other systems negative, except as noted in the HPI. Review of Systems  Objective: Vital Signs:  There were no vitals taken for this visit.  Specialty Comments:  No specialty comments available.  PMFS History: Patient Active Problem List   Diagnosis Date Noted   Hypotension 01/07/2021   S/P arthroscopy of right shoulder 06/02/2020   Closed fracture of neck of right femur ([place])    Hip fracture ([place]) 03/27/2020   Nontraumatic complete tear of right rotator cuff 02/26/2020   S/P transmetatarsal amputation of foot, left ([place]) 04/25/2016   Chronic osteomyelitis of toe, left (HCC)    Chronic diastolic CHF (congestive heart failure) ([place]) 09/25/2015   Aortic stenosis 08/04/2014   Diabetic foot infection ([place]) 06/03/2014   Peripheral vascular disease ([place]) 06/03/2014   Coronary artery disease involving native coronary artery of native heart without angina pectoris 07/29/2013   Trifascicular block 07/29/2013   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 07/26/2011   Hypothyroidism 01/22/2007   Type 2 diabetes mellitus with vascular disease ([place]) 01/22/2007   HYPERLIPIDEMIA, MIXED 01/22/2007   MYOCARDIAL INFARCTION, HX OF 01/22/2007   ANEMIA, IRON DEFICIENCY, HX OF 01/22/2007   Past Medical History:  Diagnosis Date   Anemia    low iron   Arthritis    Coronary artery disease 2006   a. remote stenting to ramus, prox LAD x2.   Dental crowns present    Diabetes mellitus    First degree AV block    GERD (gastroesophageal reflux disease)    Heart attack ([facility]) 06/2004   Heart murmur    aortic   Hx MRSA infection 02/2006   Hyperlipidemia    Hypertension    hx. of - has not been on med. since losing wt. after gastric bypass   Hypothyroidism    Morbid obesity ([place])    Mucoid cyst of joint 09/2011   left thumb   Sleep apnea    sleep study 03/29/2011; no CPAP use, lost >130lbs   Trifascicular block     Family History  Problem Relation Age of Onset   Heart disease Mother     Past Surgical History:  Procedure Laterality Date   AMPUTATION Left 04/19/2016   Procedure: Left Foot 4th Toe  Amputation vs. Transmetatarsal;  Surgeon: [name], MD;  Location: [facility];  Service: Orthopedics;  Laterality: Left;   BIOPSY  04/07/2019   Procedure: BIOPSY;  Surgeon: Otis Brace, MD;  [facility];  Service: Gastroenterology;;   CATARACT EXTRACTION  02/2010; 03/2010   COLONOSCOPY WITH PROPOFOL N/A 09/14/2014   Procedure: COLONOSCOPY WITH PROPOFOL;  Surgeon: Garlan Fair, MD;  [facility];  Service: Endoscopy;  Laterality: N/A;  COLONOSCOPY WITH PROPOFOL N/A 04/07/2019   Procedure: COLONOSCOPY WITH PROPOFOL;  Surgeon: Otis Brace, MD;  [facility];  Service: Gastroenterology;  Laterality: N/A;   CORONARY ANGIOPLASTY WITH STENT PLACEMENT  07/12/2004; 08/03/2004   total of 3 stents   I & D EXTREMITY Right 06/04/2014   Procedure: IRRIGATION AND DEBRIDEMENT EXTREMITY;  Surgeon: [name], MD;  Location: [facility];  Service: Orthopedics;  Laterality: Right;   MASS EXCISION  10/04/2011   Procedure: EXCISION MASS;  Surgeon: Wynonia Sours, MD;  Location: [facility];  Service: Orthopedics;  Laterality: Left;  excision cyst debridment ip joint of left thumb   PROSTATECTOMY     right below knee amputation  2018   ROUX-EN-Y GASTRIC BYPASS  10/10/2010   laparoscopic   SHOULDER ARTHROSCOPY WITH ROTATOR CUFF REPAIR AND SUBACROMIAL DECOMPRESSION Right 03/23/2020   Procedure: RIGHT SHOULDER ARTHROSCOPY WITH DEBRIDEMENT AND  ROTATOR CUFF REPAIR;  Surgeon: [name], MD;  Location: [facility];  Service: Orthopedics;  Laterality: Right;   TOE AMPUTATION  02/23/2006   left foot second ray amputation   TOTAL HIP ARTHROPLASTY Right 03/29/2020   Procedure: TOTAL HIP ARTHROPLASTY POSTERIOR APPROACH;  Surgeon: [name], MD;  Location: [facility];  Service: Orthopedics;  Laterality: Right;   TRANSMETATARSAL AMPUTATION Left    TRIGGER FINGER RELEASE Right 09/16/2019   Procedure: RELEASE TRIGGER FINGER/A-1 PULLEY;  Surgeon: Daryll Brod, MD;   Location: [facility];  Service: Orthopedics;  Laterality: Right;  IV REGIONAL FOREARM BLOCK   Social History   Occupational History   Occupation: Engineer, maintenance (IT) taxes  Tobacco Use   Smoking status: Never   Smokeless tobacco: Never  Substance and Sexual Activity   Alcohol use: No   Drug use: No   Sexual activity: Yes

## 2022-04-13 ENCOUNTER — Ambulatory Visit (INDEPENDENT_AMBULATORY_CARE_PROVIDER_SITE_OTHER): Payer: Medicare Other | Admitting: Orthopedic Surgery

## 2022-04-13 ENCOUNTER — Encounter: Payer: Self-pay | Admitting: Orthopedic Surgery

## 2022-04-13 DIAGNOSIS — L97521 Non-pressure chronic ulcer of other part of left foot limited to breakdown of skin: Secondary | ICD-10-CM | POA: Diagnosis not present

## 2022-04-13 NOTE — Progress Notes (Signed)
Office Visit Note   Patient: [name]           Date of Birth: [birth year]           MRN: [id number] Visit Date: 04/13/2022              Requested by: Charlane Ferretti, MD [address] [place],  [place] [address] PCP: Charlane Ferretti, MD  Chief Complaint  Patient presents with   Left Foot - Follow-up    Approved for kerecis shield placement today      HPI: Patient is a 72 year old gentleman who has a plantar Wagner grade 1 ulcer status post transmetatarsal amputation.  Patient underwent groin with Kerecis shield application last visit.    Assessment & Plan: Visit Diagnoses:  1. Non-pressure chronic ulcer of other part of left foot limited to breakdown of skin ([place])     Plan: The Kerecis shield was reapplied he will leave this in place for at least 5 days reevaluate in 2 weeks.  Follow-Up Instructions: No follow-ups on file.   Ortho Exam  Patient is alert, oriented, no adenopathy, well-dressed, normal affect, normal respiratory effort. Examination the wound is smaller.  After informed consent a 10 blade knife was used to debride the skin and soft tissue back to healthy viable granulation tissue.  The wound is 14 x 12 mm and 2 mm deep with healthy granulation tissue there is no tunneling no odor no drainage no signs of bone infection.  The Kerecis shield 8 units was applied without wastage.  This was overwrapped with a dry dressing.    Kerecis code A2019 8 units modifier JZ  Imaging: No results found.     Labs: Lab Results  Component Value Date   HGBA1C 6.8 (H) 03/28/2020   HGBA1C 6.5 (H) 09/26/2015   HGBA1C 7.0 (H) 06/04/2014   REPTSTATUS 08/01/2016 FINAL 07/30/2016   GRAMSTAIN  06/04/2014    MODERATE WBC PRESENT, PREDOMINANTLY PMN NO SQUAMOUS EPITHELIAL CELLS SEEN MODERATE GRAM POSITIVE COCCI IN CLUSTERS Performed at [facility]  06/04/2014    MODERATE WBC PRESENT, PREDOMINANTLY PMN NO SQUAMOUS EPITHELIAL CELLS  SEEN MODERATE GRAM POSITIVE COCCI IN PAIRS Performed at [facility]  07/30/2016    NO GROWTH Performed at [facility] [address] [place], [place] [address]      Lab Results  Component Value Date   ALBUMIN 3.9 08/08/2021   ALBUMIN 3.5 03/28/2020   ALBUMIN 3.5 03/28/2020    Lab Results  Component Value Date   MG 1.8 08/08/2021   MG 2.0 04/02/2020   Lab Results  Component Value Date   VD25OH 53.03 03/28/2020    No results found for: "PREALBUMIN"    Latest Ref Rng & Units 08/08/2021    2:50 AM 04/01/2020    3:31 AM 03/31/2020    5:23 AM  CBC EXTENDED  WBC 4.0 - 10.5 K/uL 6.8  8.6  9.0   RBC 4.22 - 5.81 MIL/uL 5.17  3.92  3.98   Hemoglobin 13.0 - 17.0 g/dL 15.9  12.3  12.5   HCT 39.0 - 52.0 % 47.2  36.0  36.0   Platelets 150 - 400 K/uL 197  205  181   NEUT# 1.7 - 7.7 K/uL 4.4     Lymph# 0.7 - 4.0 K/uL 1.3        There is no height or weight on file to calculate  BMI.  Orders:  No orders of the defined types were placed in this encounter.  No orders of the defined types were placed in this encounter.    Procedures: No procedures performed  Clinical Data: No additional findings.  ROS:  All other systems negative, except as noted in the HPI. Review of Systems  Objective: Vital Signs: There were no vitals taken for this visit.  Specialty Comments:  No specialty comments available.  PMFS History: Patient Active Problem List   Diagnosis Date Noted   Hypotension 01/07/2021   S/P arthroscopy of right shoulder 06/02/2020   Closed fracture of neck of right femur ([place])    Hip fracture ([place]) 03/27/2020   Nontraumatic complete tear of right rotator cuff 02/26/2020   S/P transmetatarsal amputation of foot, left ([place]) 04/25/2016   Chronic osteomyelitis of toe, left (H[facility])    Chronic diastolic CHF (congestive heart failure) ([place]) 09/25/2015   Aortic stenosis 08/04/2014   Diabetic foot infection ([place]) 06/03/2014   Peripheral  vascular disease ([place]) 06/03/2014   Coronary artery disease involving native coronary artery of native heart without angina pectoris 07/29/2013   Trifascicular block 07/29/2013   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 07/26/2011   Hypothyroidism 01/22/2007   Type 2 diabetes mellitus with vascular disease ([place]) 01/22/2007   HYPERLIPIDEMIA, MIXED 01/22/2007   MYOCARDIAL INFARCTION, HX OF 01/22/2007   ANEMIA, IRON DEFICIENCY, HX OF 01/22/2007   Past Medical History:  Diagnosis Date   Anemia    low iron   Arthritis    Coronary artery disease 2006   a. remote stenting to ramus, prox LAD x2.   Dental crowns present    Diabetes mellitus    First degree AV block    GERD (gastroesophageal reflux disease)    Heart attack ([facility]) 06/2004   Heart murmur    aortic   Hx MRSA infection 02/2006   Hyperlipidemia    Hypertension    hx. of - has not been on med. since losing wt. after gastric bypass   Hypothyroidism    Morbid obesity ([place])    Mucoid cyst of joint 09/2011   left thumb   Sleep apnea    sleep study 03/29/2011; no CPAP use, lost >130lbs   Trifascicular block     Family History  Problem Relation Age of Onset   Heart disease Mother     Past Surgical History:  Procedure Laterality Date   AMPUTATION Left 04/19/2016   Procedure: Left Foot 4th Toe Amputation vs. Transmetatarsal;  Surgeon: [name], MD;  Location: [facility];  Service: Orthopedics;  Laterality: Left;   BIOPSY  04/07/2019   Procedure: BIOPSY;  Surgeon: Otis Brace, MD;  [facility];  Service: Gastroenterology;;   CATARACT EXTRACTION  02/2010; 03/2010   COLONOSCOPY WITH PROPOFOL N/A 09/14/2014   Procedure: COLONOSCOPY WITH PROPOFOL;  Surgeon: Garlan Fair, MD;  [facility];  Service: Endoscopy;  Laterality: N/A;   COLONOSCOPY WITH PROPOFOL N/A 04/07/2019   Procedure: COLONOSCOPY WITH PROPOFOL;  Surgeon: Otis Brace, MD;  [facility];  Service: Gastroenterology;  Laterality: N/A;    CORONARY ANGIOPLASTY WITH STENT PLACEMENT  07/12/2004; 08/03/2004   total of 3 stents   I & D EXTREMITY Right 06/04/2014   Procedure: IRRIGATION AND DEBRIDEMENT EXTREMITY;  Surgeon: [name], MD;  Location: [facility];  Service: Orthopedics;  Laterality: Right;   MASS EXCISION  10/04/2011   Procedure: EXCISION MASS;  Surgeon: Wynonia Sours, MD;  Location[facility];  Service: Orthopedics;  Laterality: Left;  excision cyst debridment ip joint of left thumb   PROSTATECTOMY     right below knee amputation  2018   ROUX-EN-Y GASTRIC BYPASS  10/10/2010   laparoscopic   SHOULDER ARTHROSCOPY WITH ROTATOR CUFF REPAIR AND SUBACROMIAL DECOMPRESSION Right 03/23/2020   Procedure: RIGHT SHOULDER ARTHROSCOPY WITH DEBRIDEMENT AND  ROTATOR CUFF REPAIR;  Surgeon: [name], MD;  Location: [facility];  Service: Orthopedics;  Laterality: Right;   TOE AMPUTATION  02/23/2006   left foot second ray amputation   TOTAL HIP ARTHROPLASTY Right 03/29/2020   Procedure: TOTAL HIP ARTHROPLASTY POSTERIOR APPROACH;  Surgeon: [name], MD;  Location: [facility];  Service: Orthopedics;  Laterality: Right;   TRANSMETATARSAL AMPUTATION Left    TRIGGER FINGER RELEASE Right 09/16/2019   Procedure: RELEASE TRIGGER FINGER/A-1 PULLEY;  Surgeon: Daryll Brod, MD;  Location[facility];  Service: Orthopedics;  Laterality: Right;  IV REGIONAL FOREARM BLOCK   Social History   Occupational History   Occupation: Engineer, maintenance (IT) taxes  Tobacco Use   Smoking status: Never   Smokeless tobacco: Never  Substance and Sexual Activity   Alcohol use: No   Drug use: No   Sexual activity: Yes

## 2022-04-18 ENCOUNTER — Ambulatory Visit (INDEPENDENT_AMBULATORY_CARE_PROVIDER_SITE_OTHER): Payer: Medicare Other | Admitting: Orthopaedic Surgery

## 2022-04-18 ENCOUNTER — Encounter: Payer: Self-pay | Admitting: Orthopaedic Surgery

## 2022-04-18 DIAGNOSIS — M7062 Trochanteric bursitis, left hip: Secondary | ICD-10-CM | POA: Diagnosis not present

## 2022-04-18 MED ORDER — LIDOCAINE HCL 1 % IJ SOLN
3.0000 mL | INTRAMUSCULAR | Status: AC | PRN
  Administered 2022-04-18: 3 mL

## 2022-04-18 MED ORDER — METHYLPREDNISOLONE ACETATE 40 MG/ML IJ SUSP
40.0000 mg | INTRAMUSCULAR | Status: AC | PRN
  Administered 2022-04-18: 40 mg via INTRA_ARTICULAR

## 2022-04-18 NOTE — Progress Notes (Signed)
The patient is an established patient of our office.  I have seen him in the past.  He is currently being followed by Dr. Sharol Given for an ulcer on his left foot.  He has a previous right BKA.  He also has a right total hip arthroplasty done by one of my other partners.  Recently he has been experiencing left hip pain but it is over the trochanteric area as a source of his pain.  He has been only going on for a mile a month and it hurts laying on that side of recent and sitting for a while and goes to stand up.  He denies any groin pain.  On exam his left hip moves smoothly and fluidly.  There is no blocks or rotation of the hip and no pain in the groin.  There is pain to palpation over the proximal IT band and trochanteric area.  Previous x-rays last year of his pelvis and left hip and right hip shows a well-seated right total hip arthroplasty.  The left hip has a very well-maintained joint space.  His signs and symptoms are consistent with left hip trochanteric bursitis.  I did recommend a steroid injection.  He is a diabetic and he knows to watch his blood glucose closely.  Also showed multiple stretching exercises to try.  I did place a steroid injection in his hip which he tolerated well.  Follow-up is as needed.  He can also try Voltaren gel.  He needs to do this at least twice daily including stretching as well.     Procedure Note  Patient: [name]             Date of Birth: 1950/07/19           MRN: [id number]             Visit Date: 04/18/2022  Procedures: Visit Diagnoses:  1. Trochanteric bursitis, left hip     Large Joint Inj: L greater trochanter on 04/18/2022 8:39 AM Indications: pain and diagnostic evaluation Details: 22 G 1.5 in needle, lateral approach  Arthrogram: No  Medications: 3 mL lidocaine 1 %; 40 mg methylPREDNISolone acetate 40 MG/ML Outcome: tolerated well, no immediate complications Procedure, treatment alternatives, risks and benefits explained, specific risks  discussed. Consent was given by the patient. Immediately prior to procedure a time out was called to verify the correct patient, procedure, equipment, support staff and site/side marked as required. Patient was prepped and draped in the usual sterile fashion.

## 2022-04-26 DIAGNOSIS — B0052 Herpesviral keratitis: Secondary | ICD-10-CM | POA: Diagnosis not present

## 2022-04-26 DIAGNOSIS — Z9889 Other specified postprocedural states: Secondary | ICD-10-CM | POA: Diagnosis not present

## 2022-04-26 DIAGNOSIS — Z961 Presence of intraocular lens: Secondary | ICD-10-CM | POA: Diagnosis not present

## 2022-04-26 DIAGNOSIS — E119 Type 2 diabetes mellitus without complications: Secondary | ICD-10-CM | POA: Diagnosis not present

## 2022-04-26 DIAGNOSIS — H04123 Dry eye syndrome of bilateral lacrimal glands: Secondary | ICD-10-CM | POA: Diagnosis not present

## 2022-05-01 ENCOUNTER — Ambulatory Visit (INDEPENDENT_AMBULATORY_CARE_PROVIDER_SITE_OTHER): Payer: Medicare Other | Admitting: Orthopedic Surgery

## 2022-05-01 DIAGNOSIS — Z89432 Acquired absence of left foot: Secondary | ICD-10-CM

## 2022-05-01 DIAGNOSIS — L97521 Non-pressure chronic ulcer of other part of left foot limited to breakdown of skin: Secondary | ICD-10-CM

## 2022-05-08 ENCOUNTER — Encounter: Payer: Self-pay | Admitting: Orthopedic Surgery

## 2022-05-08 ENCOUNTER — Ambulatory Visit (INDEPENDENT_AMBULATORY_CARE_PROVIDER_SITE_OTHER): Payer: Medicare Other | Admitting: Orthopedic Surgery

## 2022-05-08 DIAGNOSIS — L97521 Non-pressure chronic ulcer of other part of left foot limited to breakdown of skin: Secondary | ICD-10-CM

## 2022-05-08 DIAGNOSIS — Z89511 Acquired absence of right leg below knee: Secondary | ICD-10-CM | POA: Diagnosis not present

## 2022-05-08 DIAGNOSIS — Z89432 Acquired absence of left foot: Secondary | ICD-10-CM | POA: Diagnosis not present

## 2022-05-08 DIAGNOSIS — S88111A Complete traumatic amputation at level between knee and ankle, right lower leg, initial encounter: Secondary | ICD-10-CM

## 2022-05-08 NOTE — Progress Notes (Signed)
Office Visit Note   Patient: [name]           Date of Birth: 01/06/1951           MRN: [id number] Visit Date: 05/01/2022              Requested by: Thana Ates, MD [address] [place],  [place] [address] PCP: Thana Ates, MD  Chief Complaint  Patient presents with   Left Foot - Follow-up      HPI: Patient is a 72 year old gentleman who presents in follow-up for Wagner grade 1 ulcer left transmetatarsal amputation.  Patient has had Kerecis shield applied on March 21.  Patient is full weightbearing in a regular shoe.  Assessment & Plan: Visit Diagnoses:  1. History of transmetatarsal amputation of left foot   2. Non-pressure chronic ulcer of other part of left foot limited to breakdown of skin     Plan:  We will apply a small amount of donated powder.  Follow-Up Instructions: Return in about 1 week (around 05/08/2022).   Ortho Exam  Patient is alert, oriented, no adenopathy, well-dressed, normal affect, normal respiratory effort. Examination the wound that has healthy granulation tissue is 1 cm diameter 2 mm deep.  There is no cellulitis odor or drainage no tunneling no undermining.  Donated micro graft, Kerecis was applied.  No cellulitis.  Imaging: No results found.   Labs: Lab Results  Component Value Date   HGBA1C 6.8 (H) 03/28/2020   HGBA1C 6.5 (H) 09/26/2015   HGBA1C 7.0 (H) 06/04/2014   REPTSTATUS 08/01/2016 FINAL 07/30/2016   GRAMSTAIN  06/04/2014    MODERATE WBC PRESENT, PREDOMINANTLY PMN NO SQUAMOUS EPITHELIAL CELLS SEEN MODERATE GRAM POSITIVE COCCI IN CLUSTERS Performed at [facility]    GRAMSTAIN  06/04/2014    MODERATE WBC PRESENT, PREDOMINANTLY PMN NO SQUAMOUS EPITHELIAL CELLS SEEN MODERATE GRAM POSITIVE COCCI IN PAIRS Performed at [facility]    CULT  07/30/2016    NO GROWTH Performed at [facility], [address], [place], [place] [address]      Lab Results  Component Value Date    ALBUMIN 3.9 08/08/2021   ALBUMIN 3.5 03/28/2020   ALBUMIN 3.5 03/28/2020    Lab Results  Component Value Date   MG 1.8 08/08/2021   MG 2.0 04/02/2020   Lab Results  Component Value Date   VD25OH 53.03 03/28/2020    No results found for: "PREALBUMIN"    Latest Ref Rng & Units 08/08/2021    2:50 AM 04/01/2020    3:31 AM 03/31/2020    5:23 AM  CBC EXTENDED  WBC 4.0 - 10.5 K/uL 6.8  8.6  9.0   RBC 4.22 - 5.81 MIL/uL 5.17  3.92  3.98   Hemoglobin 13.0 - 17.0 g/dL 97.0  26.3  78.5   HCT 39.0 - 52.0 % 47.2  36.0  36.0   Platelets 150 - 400 K/uL 197  205  181   NEUT# 1.7 - 7.7 K/uL 4.4     Lymph# 0.7 - 4.0 K/uL 1.3        There is no height or weight on file to calculate BMI.  Orders:  No orders of the defined types were placed in this encounter.  No orders of the defined types were placed in this encounter.    Procedures: No procedures performed  Clinical Data: No additional findings.  ROS:  All other systems negative, except as noted  in the HPI. Review of Systems  Objective: Vital Signs: There were no vitals taken for this visit.  Specialty Comments:  No specialty comments available.  PMFS History: Patient Active Problem List   Diagnosis Date Noted   Hypotension 01/07/2021   S/P arthroscopy of right shoulder 06/02/2020   Closed fracture of neck of right femur    Hip fracture 03/27/2020   Nontraumatic complete tear of right rotator cuff 02/26/2020   S/P transmetatarsal amputation of foot, left 04/25/2016   Chronic osteomyelitis of toe, left    Chronic diastolic CHF (congestive heart failure) 09/25/2015   Aortic stenosis 08/04/2014   Diabetic foot infection 06/03/2014   Peripheral vascular disease 06/03/2014   Coronary artery disease involving native coronary artery of native heart without angina pectoris 07/29/2013   Trifascicular block 07/29/2013   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 07/26/2011   Hypothyroidism 01/22/2007   Type 2 diabetes mellitus  with vascular disease (HCC) 01/22/2007   HYPERLIPIDEMIA, MIXED 01/22/2007   MYOCARDIAL INFARCTION, HX OF 01/22/2007   ANEMIA, IRON DEFICIENCY, HX OF 01/22/2007   Past Medical History:  Diagnosis Date   Anemia    low iron   Arthritis    Coronary artery disease 2006   a. remote stenting to ramus, prox LAD x2.   Dental crowns present    Diabetes mellitus    First degree AV block    GERD (gastroesophageal reflux disease)    Heart attack 06/2004   Heart murmur    aortic   Hx MRSA infection 02/2006   Hyperlipidemia    Hypertension    hx. of - has not been on med. since losing wt. after gastric bypass   Hypothyroidism    Morbid obesity    Mucoid cyst of joint 09/2011   left thumb   Sleep apnea    sleep study 03/29/2011; no CPAP use, lost >130lbs   Trifascicular block     Family History  Problem Relation Age of Onset   Heart disease Mother     Past Surgical History:  Procedure Laterality Date   AMPUTATION Left 04/19/2016   Procedure: Left Foot 4th Toe Amputation vs. Transmetatarsal;  Surgeon: Nadara Mustard, MD;  [facility]: [facility];  Service: Orthopedics;  Laterality: Left;   BIOPSY  04/07/2019   Procedure: BIOPSY;  Surgeon: Kathi Der, MD;  Location: [facility];  Service: Gastroenterology;;   CATARACT EXTRACTION  02/2010; 03/2010   COLONOSCOPY WITH PROPOFOL N/A 09/14/2014   Procedure: COLONOSCOPY WITH PROPOFOL;  Surgeon: Charolett Bumpers, MD;  Location: [facility];  Service: Endoscopy;  Laterality: N/A;   COLONOSCOPY WITH PROPOFOL N/A 04/07/2019   Procedure: COLONOSCOPY WITH PROPOFOL;  Surgeon: Kathi Der, MD;  Location: [facility];  Service: Gastroenterology;  Laterality: N/A;   CORONARY ANGIOPLASTY WITH STENT PLACEMENT  07/12/2004; 08/03/2004   total of 3 stents   I & D EXTREMITY Right 06/04/2014   Procedure: IRRIGATION AND DEBRIDEMENT EXTREMITY;  Surgeon: Kathryne Hitch, MD;  Location: [facility] OR;  Service: Orthopedics;  Laterality: Right;   MASS EXCISION  10/04/2011    Procedure: EXCISION MASS;  Surgeon: Nicki Reaper, MD;  Location: [facility];  Service: Orthopedics;  Laterality: Left;  excision cyst debridment ip joint of left thumb   PROSTATECTOMY     right below knee amputation  2018   ROUX-EN-Y GASTRIC BYPASS  10/10/2010   laparoscopic   SHOULDER ARTHROSCOPY WITH ROTATOR CUFF REPAIR AND SUBACROMIAL DECOMPRESSION Right 03/23/2020   Procedure: RIGHT SHOULDER  ARTHROSCOPY WITH DEBRIDEMENT AND  ROTATOR CUFF REPAIR;  Surgeon: Kathryne Hitch, MD;  Location: [facility] OR;  Service: Orthopedics;  Laterality: Right;   TOE AMPUTATION  02/23/2006   left foot second ray amputation   TOTAL HIP ARTHROPLASTY Right 03/29/2020   Procedure: TOTAL HIP ARTHROPLASTY POSTERIOR APPROACH;  Surgeon: Tarry Kos, MD;  Location: [facility];  Service: Orthopedics;  Laterality: Right;   TRANSMETATARSAL AMPUTATION Left    TRIGGER FINGER RELEASE Right 09/16/2019   Procedure: RELEASE TRIGGER FINGER/A-1 PULLEY;  Surgeon: Cindee Salt, MD;  Location: [facility];  Service: Orthopedics;  Laterality: Right;  IV REGIONAL FOREARM BLOCK   Social History   Occupational History   Occupation: IT trainer taxes  Tobacco Use   Smoking status: Never   Smokeless tobacco: Never  Substance and Sexual Activity   Alcohol use: No   Drug use: No   Sexual activity: Yes

## 2022-05-08 NOTE — Progress Notes (Signed)
Office Visit Note   Patient: [name]           Date of Birth: 03/06/1950           MRN: [id number] Visit Date: 05/08/2022              Requested by: Thana Ates, MD [address] [place],  [place] [address] PCP: Thana Ates, MD  Chief Complaint  Patient presents with   Left Foot - Follow-up    Transmet amp ulcer applications of kerecis Shield       HPI: Patient is a 72 year old gentleman who presents in follow-up status post application of Kerecis shield with transmetatarsal Wagner grade 1 ulcer.  Patient has had 2 applications and the last visit had a small donated amount of micro graft applied.  Assessment & Plan: Visit Diagnoses:  1. Below-knee amputation of right lower extremity, initial encounter   2. History of transmetatarsal amputation of left foot   3. Non-pressure chronic ulcer of other part of left foot limited to breakdown of skin     Plan: Ulcer was debrided with flat healthy granulation tissue there is no longer any depth to the wound.  There is no tunneling.  Reevaluate in 4 weeks.  Follow-Up Instructions: Return in about 4 weeks (around 06/05/2022).   Ortho Exam  Patient is alert, oriented, no adenopathy, well-dressed, normal affect, normal respiratory effort. Examination the ulcer on the plantar aspect left transmetatarsal amputation shows good improvement the wound is flat there is no tunneling no cellulitis there is healthy granulation tissue.  After informed consent a 10 blade knife was used to debride the skin and soft tissue back to healthy viable tissue.  After debridement the wound is flat 100% granulation tissue 10 mm in diameter 1 mm deep the bleeding was touched with silver nitrate.  Imaging: No results found.   Labs: Lab Results  Component Value Date   HGBA1C 6.8 (H) 03/28/2020   HGBA1C 6.5 (H) 09/26/2015   HGBA1C 7.0 (H) 06/04/2014   REPTSTATUS 08/01/2016 FINAL 07/30/2016   GRAMSTAIN  06/04/2014    MODERATE WBC PRESENT,  PREDOMINANTLY PMN NO SQUAMOUS EPITHELIAL CELLS SEEN MODERATE GRAM POSITIVE COCCI IN CLUSTERS Performed at [facility]    GRAMSTAIN  06/04/2014    MODERATE WBC PRESENT, PREDOMINANTLY PMN NO SQUAMOUS EPITHELIAL CELLS SEEN MODERATE GRAM POSITIVE COCCI IN PAIRS Performed at [facility]    CULT  07/30/2016    NO GROWTH Performed at [facility], [address] [place], [place] [address]      Lab Results  Component Value Date   ALBUMIN 3.9 08/08/2021   ALBUMIN 3.5 03/28/2020   ALBUMIN 3.5 03/28/2020    Lab Results  Component Value Date   MG 1.8 08/08/2021   MG 2.0 04/02/2020   Lab Results  Component Value Date   VD25OH 53.03 03/28/2020    No results found for: "PREALBUMIN"    Latest Ref Rng & Units 08/08/2021    2:50 AM 04/01/2020    3:31 AM 03/31/2020    5:23 AM  CBC EXTENDED  WBC 4.0 - 10.5 K/uL 6.8  8.6  9.0   RBC 4.22 - 5.81 MIL/uL 5.17  3.92  3.98   Hemoglobin 13.0 - 17.0 g/dL 42.3  53.6  14.4   HCT 39.0 - 52.0 % 47.2  36.0  36.0   Platelets 150 - 400 K/uL 197  205  181   NEUT# 1.7 -  7.7 K/uL 4.4     Lymph# 0.7 - 4.0 K/uL 1.3        There is no height or weight on file to calculate BMI.  Orders:  No orders of the defined types were placed in this encounter.  No orders of the defined types were placed in this encounter.    Procedures: No procedures performed  Clinical Data: No additional findings.  ROS:  All other systems negative, except as noted in the HPI. Review of Systems  Objective: Vital Signs: There were no vitals taken for this visit.  Specialty Comments:  No specialty comments available.  PMFS History: Patient Active Problem List   Diagnosis Date Noted   Hypotension 01/07/2021   S/P arthroscopy of right shoulder 06/02/2020   Closed fracture of neck of right femur    Hip fracture 03/27/2020   Nontraumatic complete tear of right rotator cuff 02/26/2020   S/P transmetatarsal amputation of foot, left  04/25/2016   Chronic osteomyelitis of toe, left    Chronic diastolic CHF (congestive heart failure) 09/25/2015   Aortic stenosis 08/04/2014   Diabetic foot infection 06/03/2014   Peripheral vascular disease 06/03/2014   Coronary artery disease involving native coronary artery of native heart without angina pectoris 07/29/2013   Trifascicular block 07/29/2013   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 remote stenting to ramus, prox LAD x2.   Dental crowns present    Diabetes mellitus    First degree AV block    GERD (gastroesophageal reflux disease)    Heart attack 06/2004   Heart murmur    aortic   Hx MRSA infection 02/2006   Hyperlipidemia    Hypertension    hx. of - has not been on med. since losing wt. after gastric bypass   Hypothyroidism    Morbid obesity    Mucoid cyst of joint 09/2011   left thumb   Sleep apnea    sleep study 03/29/2011; no CPAP use, lost >130lbs   Trifascicular block     Family History  Problem Relation Age of Onset   Heart disease Mother     Past Surgical History:  Procedure Laterality Date   AMPUTATION Left 04/19/2016   Procedure: Left Foot 4th Toe Amputation vs. Transmetatarsal;  Surgeon: Nadara Mustard, MD;  Location: [facility];  Service: Orthopedics;  Laterality: Left;   BIOPSY  04/07/2019   Procedure: BIOPSY;  Surgeon: Kathi Der, MD;  [facility];  Service: Gastroenterology;;   CATARACT EXTRACTION  02/2010; 03/2010   COLONOSCOPY WITH PROPOFOL N/A 09/14/2014   Procedure: COLONOSCOPY WITH PROPOFOL;  Surgeon: Charolett Bumpers, MD;  [facility]: [facility];  Service: Endoscopy;  Laterality: N/A;    COLONOSCOPY WITH PROPOFOL N/A 04/07/2019   Procedure: COLONOSCOPY WITH PROPOFOL;  Surgeon: Kathi Der, MD;  [facility];  Service: Gastroenterology;  Laterality: N/A;   CORONARY ANGIOPLASTY WITH STENT PLACEMENT  07/12/2004; 08/03/2004   total of 3 stents   I & D EXTREMITY Right 06/04/2014   Procedure: IRRIGATION AND DEBRIDEMENT EXTREMITY;  Surgeon: Kathryne Hitch, MD;  Location: [facility] OR;  Service: Orthopedics;  Laterality:  Right;   MASS EXCISION  10/04/2011   Procedure: EXCISION MASS;  Surgeon: Nicki Reaper, MD;  Location: [facility];  Service: Orthopedics;  Laterality: Left;  excision cyst debridment ip joint of left thumb   PROSTATECTOMY     right below knee amputation  2018   ROUX-EN-Y GASTRIC BYPASS  10/10/2010   laparoscopic   SHOULDER ARTHROSCOPY WITH ROTATOR CUFF REPAIR AND SUBACROMIAL DECOMPRESSION Right 03/23/2020   Procedure: RIGHT SHOULDER ARTHROSCOPY WITH DEBRIDEMENT AND  ROTATOR CUFF REPAIR;  Surgeon: Kathryne Hitch, MD;  Location[facility];  Service: Orthopedics;  Laterality: Right;   TOE AMPUTATION  02/23/2006   left foot second ray amputation   TOTAL HIP ARTHROPLASTY Right 03/29/2020   Procedure: TOTAL HIP ARTHROPLASTY POSTERIOR APPROACH;  Surgeon: Tarry Kos, MD;  Location: [facility];  Service: Orthopedics;  Laterality: Right;   TRANSMETATARSAL AMPUTATION Left    TRIGGER FINGER RELEASE Right 09/16/2019   Procedure: RELEASE TRIGGER FINGER/A-1 PULLEY;  Surgeon: Cindee Salt, MD;  Location: [facility];  Service: Orthopedics;  Laterality: Right;  IV REGIONAL FOREARM BLOCK   Social History   Occupational History   Occupation: IT trainer taxes  Tobacco Use   Smoking status: Never   Smokeless tobacco: Never  Substance and Sexual Activity   Alcohol use: No   Drug use: No   Sexual activity: Yes

## 2022-05-11 ENCOUNTER — Other Ambulatory Visit: Payer: Self-pay | Admitting: Physician Assistant

## 2022-05-17 DIAGNOSIS — Z1211 Encounter for screening for malignant neoplasm of colon: Secondary | ICD-10-CM | POA: Diagnosis not present

## 2022-05-17 DIAGNOSIS — K5790 Diverticulosis of intestine, part unspecified, without perforation or abscess without bleeding: Secondary | ICD-10-CM | POA: Diagnosis not present

## 2022-05-22 ENCOUNTER — Other Ambulatory Visit: Payer: Self-pay

## 2022-05-22 ENCOUNTER — Telehealth: Payer: Self-pay | Admitting: Orthopedic Surgery

## 2022-05-22 MED ORDER — AMOXICILLIN 500 MG PO TABS: ORAL_TABLET | ORAL | 2 refills | Status: DC

## 2022-05-22 NOTE — Telephone Encounter (Signed)
Spoke with patient. He said that he is NOT allergic to amoxicillin. Sent in script for upcoming dental procedure.

## 2022-05-22 NOTE — Telephone Encounter (Signed)
Pt called requesting script for upcoming dental appt. Please send amoxicillin to [facility] in [place]. Pt phone number is [phone].

## 2022-05-26 ENCOUNTER — Telehealth: Payer: Self-pay | Admitting: *Deleted

## 2022-05-26 NOTE — Telephone Encounter (Signed)
   Pre-operative Risk Assessment    Patient Name: [name]  DOB: 09-23-1950 MRN: [id number]      Request for Surgical Clearance    Procedure:   COLONOSCOPY SCREENING FOR COLON CANCER  Date of Surgery:  Clearance 08/21/22                                 Surgeon:  DR. Willis Modena Surgeon's Group or Practice Name:  [facility] Phone number:  [phone] Fax number:  [phone]   Type of Clearance Requested:   - Medical ; NO MEDICATIONS LISTED AS NEEDING TO BE HELD   Type of Anesthesia:   PROPOFOL   Additional requests/questions:    [name]   05/26/2022, 5:26 PM

## 2022-05-29 NOTE — Telephone Encounter (Signed)
Pt has appt with Dr. Anne Fu 06/30/22. I will add pre op clearance needed to appt notes. I will update all parties involved.

## 2022-05-29 NOTE — Telephone Encounter (Signed)
   Name: [name]  DOB: [birth year]  MRN: [id number]  Primary Cardiologist: Donato Schultz, MD   Preoperative team, please contact this patient and set up a phone call appointment for further preoperative risk assessment. Please obtain consent and complete medication review. Thank you for your help.  I confirm that guidance regarding antiplatelet and oral anticoagulation therapy has been completed and, if necessary, noted below.  None.   Carlos Levering, NP 05/29/2022, 11:26 AM [facility]

## 2022-06-08 ENCOUNTER — Other Ambulatory Visit (INDEPENDENT_AMBULATORY_CARE_PROVIDER_SITE_OTHER): Payer: Medicare Other

## 2022-06-08 ENCOUNTER — Ambulatory Visit (INDEPENDENT_AMBULATORY_CARE_PROVIDER_SITE_OTHER): Payer: Medicare Other | Admitting: Orthopedic Surgery

## 2022-06-08 DIAGNOSIS — L97521 Non-pressure chronic ulcer of other part of left foot limited to breakdown of skin: Secondary | ICD-10-CM

## 2022-06-08 MED ORDER — CIPROFLOXACIN HCL 500 MG PO TABS: 500.0000 mg | ORAL_TABLET | Freq: Two times a day (BID) | ORAL | 0 refills | Status: DC

## 2022-06-19 ENCOUNTER — Encounter: Payer: Self-pay | Admitting: Orthopedic Surgery

## 2022-06-19 NOTE — Progress Notes (Signed)
Office Visit Note   Patient: [name]           Date of Birth: 1950-12-29           MRN: [id number] Visit Date: 06/08/2022              Requested by: Thana Ates, MD [address] [place],  [place] [address] PCP: Thana Ates, MD  Chief Complaint  Patient presents with   Left Foot - Follow-up    Had several applications of Kerecis Shield in the office for transmet amputation ulcer      HPI: Patient is a 72 year old gentleman transmetatarsal amputation with utilization of Kerecis tissue graft patient has had a Kerecis shield placement in the past for the plantar ulcer.  Assessment & Plan: Visit Diagnoses:  1. Non-pressure chronic ulcer of other part of left foot limited to breakdown of skin (HCC)     Plan: Patient is provided a prescription for Cipro.  Follow-Up Instructions: Return in about 2 weeks (around 06/22/2022).   Ortho Exam  Patient is alert, oriented, no adenopathy, well-dressed, normal affect, normal respiratory effort. Examination patient has some green drainage around the wound edges.  After informed consent a 10 blade knife was used to debride the skin and soft tissue back to healthy viable tissue.  The wound is 2 x 3 cm after debridement.  Patient previously has triphasic flow at the ankle.  The Doppler was used today and he has excellent triphasic flow at the ankle.  There is cellulitis over the dorsum of the wound.  Imaging: No results found.   Labs: Lab Results  Component Value Date   HGBA1C 6.8 (H) 03/28/2020   HGBA1C 6.5 (H) 09/26/2015   HGBA1C 7.0 (H) 06/04/2014   REPTSTATUS 08/01/2016 FINAL 07/30/2016   GRAMSTAIN  06/04/2014    MODERATE WBC PRESENT, PREDOMINANTLY PMN NO SQUAMOUS EPITHELIAL CELLS SEEN MODERATE GRAM POSITIVE COCCI IN CLUSTERS Performed at [facility]    GRAMSTAIN  06/04/2014    MODERATE WBC PRESENT, PREDOMINANTLY PMN NO SQUAMOUS EPITHELIAL CELLS SEEN MODERATE GRAM POSITIVE COCCI IN PAIRS Performed at  [facility]    CULT  07/30/2016    NO GROWTH Performed at [facility], [address] [place], [place] [address]      Lab Results  Component Value Date   ALBUMIN 3.9 08/08/2021   ALBUMIN 3.5 03/28/2020   ALBUMIN 3.5 03/28/2020    Lab Results  Component Value Date   MG 1.8 08/08/2021   MG 2.0 04/02/2020   Lab Results  Component Value Date   VD25OH 53.03 03/28/2020    No results found for: "PREALBUMIN"    Latest Ref Rng & Units 08/08/2021    2:50 AM 04/01/2020    3:31 AM 03/31/2020    5:23 AM  CBC EXTENDED  WBC 4.0 - 10.5 K/uL 6.8  8.6  9.0   RBC 4.22 - 5.81 MIL/uL 5.17  3.92  3.98   Hemoglobin 13.0 - 17.0 g/dL 82.9  56.2  13.0   HCT 39.0 - 52.0 % 47.2  36.0  36.0   Platelets 150 - 400 K/uL 197  205  181   NEUT# 1.7 - 7.7 K/uL 4.4     Lymph# 0.7 - 4.0 K/uL 1.3        There is no height or weight on file to calculate BMI.  Orders:  Orders Placed This Encounter  Procedures   XR Foot Complete Left  Meds ordered this encounter  Medications   ciprofloxacin (CIPRO) 500 MG tablet    Sig: Take 1 tablet (500 mg total) by mouth 2 (two) times daily.    Dispense:  30 tablet    Refill:  0     Procedures: No procedures performed  Clinical Data: No additional findings.  ROS:  All other systems negative, except as noted in the HPI. Review of Systems  Objective: Vital Signs: There were no vitals taken for this visit.  Specialty Comments:  No specialty comments available.  PMFS History: Patient Active Problem List   Diagnosis Date Noted   Hypotension 01/07/2021   S/P arthroscopy of right shoulder 06/02/2020   Closed fracture of neck of right femur (HCC)    Hip fracture (HCC) 03/27/2020   Nontraumatic complete tear of right rotator cuff 02/26/2020   S/P transmetatarsal amputation of foot, left (HCC) 04/25/2016   Chronic osteomyelitis of toe, left (HCC)    Chronic diastolic CHF (congestive heart failure) (HCC) 09/25/2015   Aortic stenosis  08/04/2014   Diabetic foot infection (HCC) 06/03/2014   Peripheral vascular disease (HCC) 06/03/2014   Coronary artery disease involving native coronary artery of native heart without angina pectoris 07/29/2013   Trifascicular block 07/29/2013   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 remote stenting to ramus, prox LAD x2.   Dental crowns present    Diabetes mellitus    First degree AV block    GERD (gastroesophageal reflux disease)    Heart attack (HCC) 06/2004   Heart murmur    aortic   Hx MRSA infection 02/2006   Hyperlipidemia    Hypertension    hx. of - has not been on med. since losing wt. after gastric bypass   Hypothyroidism    Morbid obesity (HCC)    Mucoid cyst of joint 09/2011   left thumb   Sleep apnea    sleep study 03/29/2011; no CPAP use, lost >130lbs   Trifascicular block     Family History  Problem Relation Age of Onset   Heart disease Mother     Past Surgical History:  Procedure Laterality Date   AMPUTATION Left 04/19/2016   Procedure: Left Foot 4th Toe Amputation vs. Transmetatarsal;  Surgeon: Nadara Mustard, MD;  Location: [facility];  Service: Orthopedics;  Laterality: Left;   BIOPSY  04/07/2019   Procedure: BIOPSY;  Surgeon: Kathi Der, MD;  Location: [facility];  Service: Gastroenterology;;   CATARACT EXTRACTION  02/2010; 03/2010   COLONOSCOPY WITH PROPOFOL N/A 09/14/2014   Procedure: COLONOSCOPY WITH PROPOFOL;  Surgeon: Charolett Bumpers, MD;  Location: [facility];  Service: Endoscopy;  Laterality: N/A;   COLONOSCOPY WITH PROPOFOL N/A 04/07/2019   Procedure: COLONOSCOPY WITH PROPOFOL;  Surgeon: Kathi Der, MD;   Location: [facility];  Service: Gastroenterology;  Laterality: N/A;   CORONARY ANGIOPLASTY WITH STENT PLACEMENT  07/12/2004; 08/03/2004   total of 3 stents   I & D EXTREMITY Right 06/04/2014   Procedure: IRRIGATION AND DEBRIDEMENT EXTREMITY;  Surgeon: Kathryne Hitch, MD;  [facility];  Service: Orthopedics;  Laterality: Right;   MASS EXCISION  10/04/2011   Procedure: EXCISION MASS;  Surgeon: Nicki Reaper, MD;  Location: [facility];  Service: Orthopedics;  Laterality: Left;  excision cyst debridment ip joint of left thumb   PROSTATECTOMY     right below knee amputation  2018   ROUX-EN-Y GASTRIC BYPASS  10/10/2010   laparoscopic   SHOULDER ARTHROSCOPY WITH ROTATOR CUFF REPAIR AND SUBACROMIAL DECOMPRESSION Right 03/23/2020   Procedure: RIGHT SHOULDER ARTHROSCOPY WITH DEBRIDEMENT AND  ROTATOR CUFF REPAIR;  Surgeon: Kathryne Hitch, MD;  Location: [facility];  Service: Orthopedics;  Laterality: Right;   TOE AMPUTATION  02/23/2006   left foot second ray amputation   TOTAL HIP ARTHROPLASTY Right 03/29/2020   Procedure: TOTAL HIP ARTHROPLASTY POSTERIOR APPROACH;  Surgeon: Tarry Kos, MD;  Location: [facility];  Service: Orthopedics;  Laterality: Right;   TRANSMETATARSAL AMPUTATION Left    TRIGGER FINGER RELEASE Right 09/16/2019   Procedure: RELEASE TRIGGER FINGER/A-1 PULLEY;  Surgeon: Cindee Salt, MD;  Location: [facility];  Service: Orthopedics;  Laterality: Right;  IV REGIONAL FOREARM BLOCK   Social History   Occupational History   Occupation: IT trainer taxes  Tobacco Use   Smoking status: Never   Smokeless tobacco: Never  Substance and Sexual Activity   Alcohol use: No   Drug use: No   Sexual activity: Yes

## 2022-06-22 ENCOUNTER — Ambulatory Visit (INDEPENDENT_AMBULATORY_CARE_PROVIDER_SITE_OTHER): Payer: Medicare Other | Admitting: Orthopedic Surgery

## 2022-06-22 DIAGNOSIS — L97521 Non-pressure chronic ulcer of other part of left foot limited to breakdown of skin: Secondary | ICD-10-CM

## 2022-06-26 ENCOUNTER — Encounter: Payer: Self-pay | Admitting: Orthopedic Surgery

## 2022-06-26 NOTE — Progress Notes (Signed)
Office Visit Note   Patient: [name]           Date of Birth: December 01, 1950           MRN: [id number] Visit Date: 06/22/2022              Requested by: Thana Ates, MD [address] [place],  [place] [address] PCP: Thana Ates, MD  Chief Complaint  Patient presents with   Left Foot - Wound Check    Left transmet amputation S/p kerecis shield placement in office      HPI: Patient is a 72 year old gentleman status post left transmetatarsal amputation and status post Kerecis shield.  Patient started on Cipro his last evaluation.  Assessment & Plan: Visit Diagnoses:  1. Non-pressure chronic ulcer of other part of left foot limited to breakdown of skin (HCC)     Plan: Continue with current care.  Follow-Up Instructions: No follow-ups on file.   Ortho Exam  Patient is alert, oriented, no adenopathy, well-dressed, normal affect, normal respiratory effort. Examination the wound bed has healthy granulation tissue there is no discoloration no ischemic changes.  There is no tunneling no cellulitis or drainage.  The foot is not tender to palpation.  The wound is 10 x 15 mm and 2 mm deep with healthy flat granulation tissue.  Imaging: No results found.   Labs: Lab Results  Component Value Date   HGBA1C 6.8 (H) 03/28/2020   HGBA1C 6.5 (H) 09/26/2015   HGBA1C 7.0 (H) 06/04/2014   REPTSTATUS 08/01/2016 FINAL 07/30/2016   GRAMSTAIN  06/04/2014    MODERATE WBC PRESENT, PREDOMINANTLY PMN NO SQUAMOUS EPITHELIAL CELLS SEEN MODERATE GRAM POSITIVE COCCI IN CLUSTERS Performed at [facility]    GRAMSTAIN  06/04/2014    MODERATE WBC PRESENT, PREDOMINANTLY PMN NO SQUAMOUS EPITHELIAL CELLS SEEN MODERATE GRAM POSITIVE COCCI IN PAIRS Performed at [facility]    CULT  07/30/2016    NO GROWTH Performed at [facility], [address], [place], [place] [address]      Lab Results  Component Value Date   ALBUMIN 3.9 08/08/2021   ALBUMIN 3.5  03/28/2020   ALBUMIN 3.5 03/28/2020    Lab Results  Component Value Date   MG 1.8 08/08/2021   MG 2.0 04/02/2020   Lab Results  Component Value Date   VD25OH 53.03 03/28/2020    No results found for: "PREALBUMIN"    Latest Ref Rng & Units 08/08/2021    2:50 AM 04/01/2020    3:31 AM 03/31/2020    5:23 AM  CBC EXTENDED  WBC 4.0 - 10.5 K/uL 6.8  8.6  9.0   RBC 4.22 - 5.81 MIL/uL 5.17  3.92  3.98   Hemoglobin 13.0 - 17.0 g/dL 95.2  84.1  32.4   HCT 39.0 - 52.0 % 47.2  36.0  36.0   Platelets 150 - 400 K/uL 197  205  181   NEUT# 1.7 - 7.7 K/uL 4.4     Lymph# 0.7 - 4.0 K/uL 1.3        There is no height or weight on file to calculate BMI.  Orders:  No orders of the defined types were placed in this encounter.  No orders of the defined types were placed in this encounter.    Procedures: No procedures performed  Clinical Data: No additional findings.  ROS:  All other systems negative, except as noted in the HPI. Review of Systems  Objective: Vital Signs: There were no vitals taken for this visit.  Specialty Comments:  No specialty comments available.  PMFS History: Patient Active Problem List   Diagnosis Date Noted   Hypotension 01/07/2021   S/P arthroscopy of right shoulder 06/02/2020   Closed fracture of neck of right femur (HCC)    Hip fracture (HCC) 03/27/2020   Nontraumatic complete tear of right rotator cuff 02/26/2020   S/P transmetatarsal amputation of foot, left (HCC) 04/25/2016   Chronic osteomyelitis of toe, left (HCC)    Chronic diastolic CHF (congestive heart failure) (HCC) 09/25/2015   Aortic stenosis 08/04/2014   Diabetic foot infection (HCC) 06/03/2014   Peripheral vascular disease (HCC) 06/03/2014   Coronary artery disease involving native coronary artery of native heart without angina pectoris 07/29/2013   Trifascicular block 07/29/2013   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 07/26/2011   Hypothyroidism 01/22/2007   Type 2 diabetes  mellitus with vascular disease (HCC) 01/22/2007   HYPERLIPIDEMIA, MIXED 01/22/2007   MYOCARDIAL INFARCTION, HX OF 01/22/2007   ANEMIA, IRON DEFICIENCY, HX OF 01/22/2007   Past Medical History:  Diagnosis Date   Anemia    low iron   Arthritis    Coronary artery disease 2006   a. remote stenting to ramus, prox LAD x2.   Dental crowns present    Diabetes mellitus    First degree AV block    GERD (gastroesophageal reflux disease)    Heart attack (HCC) 06/2004   Heart murmur    aortic   Hx MRSA infection 02/2006   Hyperlipidemia    Hypertension    hx. of - has not been on med. since losing wt. after gastric bypass   Hypothyroidism    Morbid obesity (HCC)    Mucoid cyst of joint 09/2011   left thumb   Sleep apnea    sleep study 03/29/2011; no CPAP use, lost >130lbs   Trifascicular block     Family History  Problem Relation Age of Onset   Heart disease Mother     Past Surgical History:  Procedure Laterality Date   AMPUTATION Left 04/19/2016   Procedure: Left Foot 4th Toe Amputation vs. Transmetatarsal;  Surgeon: Nadara Mustard, MD;  Location: [facility];  Service: Orthopedics;  Laterality: Left;   BIOPSY  04/07/2019   Procedure: BIOPSY;  Surgeon: Kathi Der, MD;  [facility]: [facility];  Service: Gastroenterology;;   CATARACT EXTRACTION  02/2010; 03/2010   COLONOSCOPY WITH PROPOFOL N/A 09/14/2014   Procedure: COLONOSCOPY WITH PROPOFOL;  Surgeon: Charolett Bumpers, MD;  Location: [facility];  Service: Endoscopy;  Laterality: N/A;   COLONOSCOPY WITH PROPOFOL N/A 04/07/2019   Procedure: COLONOSCOPY WITH PROPOFOL;  Surgeon: Kathi Der, MD;  Location: [facility];  Service: Gastroenterology;  Laterality: N/A;   CORONARY ANGIOPLASTY WITH STENT PLACEMENT  07/12/2004; 08/03/2004   total of 3 stents   I & D EXTREMITY Right 06/04/2014   Procedure: IRRIGATION AND DEBRIDEMENT EXTREMITY;  Surgeon: Kathryne Hitch, MD;  Location: [facility] OR;  Service: Orthopedics;  Laterality: Right;    MASS EXCISION  10/04/2011   Procedure: EXCISION MASS;  Surgeon: Nicki Reaper, MD;  Location: [facility];  Service: Orthopedics;  Laterality: Left;  excision cyst debridment ip joint of left thumb   PROSTATECTOMY     right below knee amputation  2018   ROUX-EN-Y GASTRIC BYPASS  10/10/2010   laparoscopic   SHOULDER ARTHROSCOPY WITH ROTATOR CUFF REPAIR AND SUBACROMIAL DECOMPRESSION Right 03/23/2020   Procedure:  RIGHT SHOULDER ARTHROSCOPY WITH DEBRIDEMENT AND  ROTATOR CUFF REPAIR;  Surgeon: Kathryne Hitch, MD;  Location: [facility] OR;  Service: Orthopedics;  Laterality: Right;   TOE AMPUTATION  02/23/2006   left foot second ray amputation   TOTAL HIP ARTHROPLASTY Right 03/29/2020   Procedure: TOTAL HIP ARTHROPLASTY POSTERIOR APPROACH;  Surgeon: Tarry Kos, MD;  Location: [facility];  Service: Orthopedics;  Laterality: Right;   TRANSMETATARSAL AMPUTATION Left    TRIGGER FINGER RELEASE Right 09/16/2019   Procedure: RELEASE TRIGGER FINGER/A-1 PULLEY;  Surgeon: Cindee Salt, MD;  Location: [facility];  Service: Orthopedics;  Laterality: Right;  IV REGIONAL FOREARM BLOCK   Social History   Occupational History   Occupation: IT trainer taxes  Tobacco Use   Smoking status: Never   Smokeless tobacco: Never  Substance and Sexual Activity   Alcohol use: No   Drug use: No   Sexual activity: Yes

## 2022-06-29 ENCOUNTER — Other Ambulatory Visit: Payer: Self-pay | Admitting: Thoracic Surgery (Cardiothoracic Vascular Surgery)

## 2022-06-29 ENCOUNTER — Other Ambulatory Visit: Payer: Self-pay | Admitting: Orthopedic Surgery

## 2022-06-29 DIAGNOSIS — I7121 Aneurysm of the ascending aorta, without rupture: Secondary | ICD-10-CM

## 2022-06-30 ENCOUNTER — Ambulatory Visit: Payer: Medicare Other | Attending: Cardiology | Admitting: Cardiology

## 2022-06-30 VITALS — BP 142/72 | HR 51 | Ht >= 80 in | Wt 224.0 lb

## 2022-06-30 DIAGNOSIS — I10 Essential (primary) hypertension: Secondary | ICD-10-CM | POA: Diagnosis not present

## 2022-06-30 DIAGNOSIS — Z01810 Encounter for preprocedural cardiovascular examination: Secondary | ICD-10-CM | POA: Diagnosis not present

## 2022-06-30 DIAGNOSIS — I2510 Atherosclerotic heart disease of native coronary artery without angina pectoris: Secondary | ICD-10-CM | POA: Insufficient documentation

## 2022-06-30 NOTE — Patient Instructions (Signed)
Medication Instructions:  The current medical regimen is effective;  continue present plan and medications.  *If you need a refill on your cardiac medications before your next appointment, please call your pharmacy*  OK to proceed with colonoscopy - OK to hold Aspirin if necessary.  Follow-Up: At [facility], you and your health needs are our priority.  As part of our continuing mission to provide you with exceptional heart care, we have created designated Provider Care Teams.  These Care Teams include your primary Cardiologist (physician) and Advanced Practice Providers (APPs -  Physician Assistants and Nurse Practitioners) who all work together to provide you with the care you need, when you need it.  We recommend signing up for the patient portal called "MyChart".  Sign up information is provided on this After Visit Summary.  MyChart is used to connect with patients for Virtual Visits (Telemedicine).  Patients are able to view lab/test results, encounter notes, upcoming appointments, etc.  Non-urgent messages can be sent to your provider as well.   To learn more about what you can do with MyChart, go to [email or web address].    Your next appointment:   1 year(s)  Provider:   Donato Schultz, MD

## 2022-06-30 NOTE — Progress Notes (Signed)
Cardiology Office Note:    Date:  06/30/2022   ID:  [name], DOB 1950-07-07, MRN [id number]  PCP:  Thana Ates, MD   [facility] Providers Cardiologist:  Donato Schultz, MD     Referring MD: Thana Ates, MD    History of Present Illness:    [name] is a 72 y.o. male here for preop evaluation prior to colonoscopy.  Has coronary artery disease with PCI to ramus in 2006 2 stents to the proximal LAD and diffuse disease in the distal RCA.  Hypertension hyperlipidemia diabetes obstructive sleep apnea gastric bypass.  Doing well no syncope no bleeding no chest pain.  Having a new addition to his right leg prosthesis.    Past Medical History:  Diagnosis Date   Anemia    low iron   Arthritis    Coronary artery disease 2006   a. remote stenting to ramus, prox LAD x2.   Dental crowns present    Diabetes mellitus    First degree AV block    GERD (gastroesophageal reflux disease)    Heart attack (HCC) 06/2004   Heart murmur    aortic   Hx MRSA infection 02/2006   Hyperlipidemia    Hypertension    hx. of - has not been on med. since losing wt. after gastric bypass   Hypothyroidism    Morbid obesity (HCC)    Mucoid cyst of joint 09/2011   left thumb   Sleep apnea    sleep study 03/29/2011; no CPAP use, lost >130lbs   Trifascicular block     Past Surgical History:  Procedure Laterality Date   AMPUTATION Left 04/19/2016   Procedure: Left Foot 4th Toe Amputation vs. Transmetatarsal;  Surgeon: Nadara Mustard, MD;  Location: [facility];  Service: Orthopedics;  Laterality: Left;   BIOPSY  04/07/2019   Procedure: BIOPSY;  Surgeon: Kathi Der, MD;  Location: [facility];  Service: Gastroenterology;;   CATARACT EXTRACTION  02/2010; 03/2010   COLONOSCOPY WITH PROPOFOL N/A 09/14/2014   Procedure: COLONOSCOPY WITH PROPOFOL;  Surgeon: Charolett Bumpers, MD;  Location: [facility];  Service: Endoscopy;  Laterality: N/A;   COLONOSCOPY WITH PROPOFOL N/A 04/07/2019   Procedure:  COLONOSCOPY WITH PROPOFOL;  Surgeon: Kathi Der, MD;  Location: [facility];  Service: Gastroenterology;  Laterality: N/A;   CORONARY ANGIOPLASTY WITH STENT PLACEMENT  07/12/2004; 08/03/2004   total of 3 stents   I & D EXTREMITY Right 06/04/2014   Procedure: IRRIGATION AND DEBRIDEMENT EXTREMITY;  Surgeon: Kathryne Hitch, MD;  Location: [facility] OR;  Service: Orthopedics;  Laterality: Right;   MASS EXCISION  10/04/2011   Procedure: EXCISION MASS;  Surgeon: Nicki Reaper, MD;  Location: [facility];  Service: Orthopedics;  Laterality: Left;  excision cyst debridment ip joint of left thumb   PROSTATECTOMY     right below knee amputation  2018   ROUX-EN-Y GASTRIC BYPASS  10/10/2010   laparoscopic   SHOULDER ARTHROSCOPY WITH ROTATOR CUFF REPAIR AND SUBACROMIAL DECOMPRESSION Right 03/23/2020   Procedure: RIGHT SHOULDER ARTHROSCOPY WITH DEBRIDEMENT AND  ROTATOR CUFF REPAIR;  Surgeon: Kathryne Hitch, MD;  Location: [facility];  Service: Orthopedics;  Laterality: Right;   TOE AMPUTATION  02/23/2006   left foot second ray amputation   TOTAL HIP ARTHROPLASTY Right 03/29/2020   Procedure: TOTAL HIP ARTHROPLASTY POSTERIOR APPROACH;  Surgeon: Tarry Kos, MD;  Location: [facility] OR;  Service: Orthopedics;  Laterality: Right;   TRANSMETATARSAL AMPUTATION Left  TRIGGER FINGER RELEASE Right 09/16/2019   Procedure: RELEASE TRIGGER FINGER/A-1 PULLEY;  Surgeon: Cindee Salt, MD;  Location: [facility];  Service: Orthopedics;  Laterality: Right;  IV REGIONAL FOREARM BLOCK    Current Medications: Current Meds  Medication Sig   amoxicillin (AMOXIL) 500 MG tablet Take 4 tablets by mouth 1 hour prior to dental procedure.   aspirin EC 81 MG tablet Take 81 mg by mouth daily.   atorvastatin (LIPITOR) 10 MG tablet Take 10 mg by mouth at bedtime.    augmented betamethasone dipropionate (DIPROLENE-AF) 0.05 % ointment APPLY SPARINGLY TO AFFECTED AREA TWICE A DAY   Calcium Citrate-Vitamin D  (CALCIUM CITRATE CHEWY BITE PO) Take 500 mg by mouth 3 (three) times daily.   DULCOLAX 5 MG EC tablet 2 tablets Orally twice a day for 1 days   ferrous sulfate 325 (65 FE) MG tablet Take 325 mg by mouth daily.    fluticasone (FLONASE) 50 MCG/ACT nasal spray Place 2 sprays into both nostrils daily as needed for allergies.    gabapentin (NEURONTIN) 100 MG capsule TAKE 1 CAPSULE (100 MG TOTAL) BY MOUTH 4 (FOUR) TIMES DAILY. WHEN NECESSARY FOR NEUROPATHY PAIN   Hypromellose (GENTEAL SEVERE OP) Place 1 drop into the right eye daily as needed (Dry eyes).   ketoconazole (NIZORAL) 2 % cream Apply 1 Application topically daily as needed for rash.   levothyroxine (SYNTHROID) 150 MCG tablet Take 150 mcg by mouth daily before breakfast.   lisinopril (ZESTRIL) 2.5 MG tablet TAKE 1 TABLET BY MOUTH EVERY DAY   metFORMIN (GLUCOPHAGE-XR) 500 MG 24 hr tablet Take 500 mg by mouth 2 (two) times daily.   Multiple Vitamin (MULTIVITAMIN WITH MINERALS) TABS tablet Take 1 tablet by mouth in the morning and at bedtime. Gummies (Patient taking differently: Take 2 tablets by mouth in the morning and at bedtime. Gummies)   Multiple Vitamins-Minerals (PRESERVISION AREDS 2) CAPS Take 1 capsule by mouth daily.   nitroGLYCERIN (NITROSTAT) 0.4 MG SL tablet Place 1 tablet (0.4 mg total) under the tongue every 5 (five) minutes x 3 doses as needed for chest pain.   Omega 3 1000 MG CAPS Take 1,000 mg by mouth daily.   Omeprazole 20 MG TBEC Take 20 mg by mouth daily as needed (acid reflux/ indigestion).    polyethylene glycol-electrolytes (NULYTELY) 420 g solution See admin instructions.   sildenafil (REVATIO) 20 MG tablet Take by mouth.   Valerian Root 450 MG CAPS Take 450 mg by mouth at bedtime.   vitamin B-12 (CYANOCOBALAMIN) 1000 MCG tablet Take 1,000 mcg by mouth 3 (three) times a week. Mon, wed, Friday     Allergies:   Other, Moxifloxacin, Penicillins, Quinolones, and Sulfamethoxazole-trimethoprim   Social History    Socioeconomic History   Marital status: Married    Spouse name: Not on file   Number of children: Not on file   Years of education: Not on file   Highest education level: Not on file  Occupational History   Occupation: IT trainer taxes  Tobacco Use   Smoking status: Never   Smokeless tobacco: Never  Substance and Sexual Activity   Alcohol use: No   Drug use: No   Sexual activity: Yes  Other Topics Concern   Not on file  Social History Narrative   Parents and sister all using CPAP   Social Determinants of Health   Financial Resource Strain: Not on file  Food Insecurity: Not on file  Transportation Needs: Not on file  Physical Activity:  Not on file  Stress: Not on file  Social Connections: Not on file     Family History: The patient's family history includes Heart disease in his mother.  ROS:   Please see the history of present illness.     All other systems reviewed and are negative.  EKGs/Labs/Other Studies Reviewed:    The following studies were reviewed today: Cardiac Studies & Procedures     STRESS TESTS  NM MYOCAR MULTI W/SPECT W 09/26/2015  Narrative CLINICAL DATA:  Chest pain  EXAM: MYOCARDIAL IMAGING WITH SPECT (REST AND PHARMACOLOGIC-STRESS)  GATED LEFT VENTRICULAR WALL MOTION STUDY  LEFT VENTRICULAR EJECTION FRACTION  TECHNIQUE: Standard myocardial SPECT imaging was performed after resting intravenous injection of 10 mCi Tc-76m tetrofosmin. Subsequently, intravenous infusion of Lexiscan was performed under the supervision of the Cardiology staff. At peak effect of the drug, 30 mCi Tc-58m tetrofosmin was injected intravenously and standard myocardial SPECT imaging was performed. Quantitative gated imaging was also performed to evaluate left ventricular wall motion, and estimate left ventricular ejection fraction.  COMPARISON:  None.  FINDINGS: Perfusion: No decreased activity in the left ventricle on stress imaging to suggest reversible  ischemia or infarction. Decreased uptake along the inferolateral wall on rest imaging is likely related to GI uptake.  Wall Motion: Global hypokinesis.  Mild left ventricular dilatation.  Left Ventricular Ejection Fraction: 46 %  End diastolic volume 164 ml  End systolic volume 89 ml  IMPRESSION: 1. No reversible ischemia or infarction.  2. Mild global hypokinesis.  No focal wall motion abnormality.  3. Left ventricular ejection fraction 46%  4. Non invasive risk stratification*: Intermediate, due to low ejection fraction.  *2012 Appropriate Use Criteria for Coronary Revascularization Focused Update: J Am Coll Cardiol. 2012;59(9):857-881. http://content.dementiazones.com.aspx?articleid=1201161   Electronically Signed By: Charline Bills M.D. On: 09/26/2015 14:12   ECHOCARDIOGRAM  ECHOCARDIOGRAM COMPLETE 02/02/2022  Narrative ECHOCARDIOGRAM REPORT    Patient Name:   KEM HENSEN [facility]   Date of Exam: 02/02/2022 Medical Rec #:  [id number]     Height:       80.0 in Accession #:    [id number]    Weight:       237.0 lb Date of Birth:  1950/09/02      BSA:          2.472 m Patient Age:    71 years      BP:           152/85 mmHg Patient Gender: M             HR:           58 bpm. Exam Location:  [address]  Procedure: 2D Echo, Cardiac Doppler, Color Doppler and Strain Analysis  Indications:    I35.9 Aortic Valve disorder  History:        Patient has prior history of Echocardiogram examinations, most recent 01/26/2021. CAD, Arrythmias:Trifascicular block, Signs/Symptoms:Murmur; Risk Factors:Hypertension, HLD, Diabetes and Sleep Apnea.  Sonographer:    Clearence Ped RCS Referring Phys: [id number] Ilisha Blust C Oluwatobi Visser  IMPRESSIONS   1. Left ventricular ejection fraction, by estimation, is 60 to 65%. The left ventricle has normal function. The left ventricle has no regional wall motion abnormalities. Left ventricular diastolic parameters are consistent with Grade I  diastolic dysfunction (impaired relaxation). The average left ventricular global longitudinal strain is -21.3 %. The global longitudinal strain is normal. 2. Right ventricular systolic function is normal. The right ventricular size is normal. There is normal pulmonary artery systolic pressure.  3. The mitral valve is degenerative. Mild mitral valve regurgitation. No evidence of mitral stenosis. 4. The aortic valve is normal in structure. There is moderate calcification of the aortic valve. There is moderate thickening of the aortic valve. Aortic valve regurgitation is mild. No aortic stenosis is present. Aortic regurgitation PHT measures 483 msec. Aortic valve area, by VTI measures 2.78 cm. Aortic valve mean gradient measures 10.0 mmHg. Aortic valve Vmax measures 2.08 m/s. 5. Aortic dilatation noted. There is mild dilatation of the aortic root, measuring 42 mm. There is mild dilatation of the ascending aorta, measuring 44 mm. 6. The inferior vena cava is normal in size with greater than 50% respiratory variability, suggesting right atrial pressure of 3 mmHg.  FINDINGS Left Ventricle: Left ventricular ejection fraction, by estimation, is 60 to 65%. The left ventricle has normal function. The left ventricle has no regional wall motion abnormalities. The average left ventricular global longitudinal strain is -21.3 %. The global longitudinal strain is normal. The left ventricular internal cavity size was normal in size. There is no left ventricular hypertrophy. Left ventricular diastolic parameters are consistent with Grade I diastolic dysfunction (impaired relaxation). Normal left ventricular filling pressure.  Right Ventricle: The right ventricular size is normal. No increase in right ventricular wall thickness. Right ventricular systolic function is normal. There is normal pulmonary artery systolic pressure. The tricuspid regurgitant velocity is 1.98 m/s, and with an assumed right atrial pressure of 3  mmHg, the estimated right ventricular systolic pressure is 18.7 mmHg.  Left Atrium: Left atrial size was normal in size.  Right Atrium: Right atrial size was normal in size.  Pericardium: There is no evidence of pericardial effusion.  Mitral Valve: The mitral valve is degenerative in appearance. There is mild calcification of the mitral valve leaflet(s). Mild mitral annular calcification. Mild mitral valve regurgitation. No evidence of mitral valve stenosis.  Tricuspid Valve: The tricuspid valve is normal in structure. Tricuspid valve regurgitation is trivial. No evidence of tricuspid stenosis.  Aortic Valve: The aortic valve is normal in structure. There is moderate calcification of the aortic valve. There is moderate thickening of the aortic valve. Aortic valve regurgitation is mild. Aortic regurgitation PHT measures 483 msec. No aortic stenosis is present. Aortic valve mean gradient measures 10.0 mmHg. Aortic valve peak gradient measures 17.3 mmHg. Aortic valve area, by VTI measures 2.78 cm.  Pulmonic Valve: The pulmonic valve was normal in structure. Pulmonic valve regurgitation is trivial. No evidence of pulmonic stenosis.  Aorta: Aortic dilatation noted. There is mild dilatation of the aortic root, measuring 42 mm. There is mild dilatation of the ascending aorta, measuring 44 mm.  Venous: The inferior vena cava is normal in size with greater than 50% respiratory variability, suggesting right atrial pressure of 3 mmHg.  IAS/Shunts: No atrial level shunt detected by color flow Doppler.   LEFT VENTRICLE PLAX 2D LVIDd:         5.50 cm   Diastology LVIDs:         3.80 cm   LV e' medial:    4.13 cm/s LV PW:         1.00 cm   LV E/e' medial:  14.3 LV IVS:        0.90 cm   LV e' lateral:   4.68 cm/s LVOT diam:     2.20 cm   LV E/e' lateral: 12.6 LV SV:         132 LV SV Index:   54  2D Longitudinal Strain LVOT Area:     3.80 cm  2D Strain GLS (A2C):   -18.4 % 2D Strain GLS  (A3C):   -23.2 % 2D Strain GLS (A4C):   -22.2 % 2D Strain GLS Avg:     -21.3 %  RIGHT VENTRICLE RV Basal diam:  3.40 cm RV S prime:     12.30 cm/s TAPSE (M-mode): 2.1 cm RVSP:           18.7 mmHg  LEFT ATRIUM             Index        RIGHT ATRIUM           Index LA diam:        5.30 cm 2.14 cm/m   RA Pressure: 3.00 mmHg LA Vol (A2C):   67.3 ml 27.23 ml/m  RA Area:     18.00 cm LA Vol (A4C):   63.9 ml 25.85 ml/m  RA Volume:   38.70 ml  15.66 ml/m LA Biplane Vol: 65.3 ml 26.42 ml/m AORTIC VALVE AV Area (Vmax):    2.64 cm AV Area (Vmean):   2.49 cm AV Area (VTI):     2.78 cm AV Vmax:           207.67 cm/s AV Vmean:          145.667 cm/s AV VTI:            0.477 m AV Peak Grad:      17.3 mmHg AV Mean Grad:      10.0 mmHg LVOT Vmax:         144.00 cm/s LVOT Vmean:        95.400 cm/s LVOT VTI:          0.348 m LVOT/AV VTI ratio: 0.73 AI PHT:            483 msec  AORTA Ao Root diam: 4.20 cm Ao Asc diam:  4.40 cm  MITRAL VALVE               TRICUSPID VALVE MV Area (PHT):             TR Peak grad:   15.7 mmHg MV Decel Time:             TR Vmax:        198.00 cm/s MV E velocity: 59.20 cm/s  Estimated RAP:  3.00 mmHg MV A velocity: 79.10 cm/s  RVSP:           18.7 mmHg MV E/A ratio:  0.75 SHUNTS Systemic VTI:  0.35 m Systemic Diam: 2.20 cm  Armanda Magic MD Electronically signed by Armanda Magic MD Signature Date/Time: 02/02/2022/4:34:35 PM    Final    MONITORS  LONG TERM MONITOR (3-14 DAYS) 09/05/2021  Narrative   Sinus rhythm heart rate 59 bpm on average   First-degree AV block noted.  Bundle branch block/intraventricular conduction delay present.   5 brief runs of ventricular tachycardia noted-no symptoms noted   Atrial tachycardia also noted longest approximately 18 seconds with average heart rate of 90 bpm   No evidence of atrial fibrillation.   Patch Wear Time:  13 days and 22 hours (2023-07-20T15:14:50-0400 to 2023-08-03T13:45:43-0400)  Patient had a  min HR of 35 bpm, max HR of 285 bpm, and avg HR of 59 bpm. Predominant underlying rhythm was Sinus Rhythm. First Degree AV Block was present. Bundle Branch Block/IVCD was present. 5 Ventricular Tachycardia runs occurred, the run with the fastest  interval lasting 5 beats with a max rate of 285 bpm, the longest lasting 13 beats with an avg rate of 140 bpm. 23 Supraventricular Tachycardia runs occurred, the run with the fastest interval lasting 6 beats with a max rate of 184 bpm, the longest lasting 18.5 secs with an avg rate of 90 bpm. Isolated SVEs were occasional (2.7%, 32166), SVE Couplets were rare (<1.0%, 204), and SVE Triplets were rare (<1.0%, 29). Isolated VEs were rare (<1.0%, 1688), VE Couplets were rare (<1.0%, 97), and VE Triplets were rare (<1.0%, 12). Ventricular Bigeminy was present.            EKG:   6/7/202024-sinus bradycardia first-degree AV block 51 bpm 246, bifascicular block. 01/07/2021-sinus rhythm 61 first-degree AV block left anterior fascicular block  Recent Labs: 08/08/2021: ALT 22; Hemoglobin 15.9; Magnesium 1.8; Platelets 197 12/23/2021: BUN 15; Creatinine, Ser 0.93; Potassium 4.2; Sodium 141  Recent Lipid Panel No results found for: "CHOL", "TRIG", "HDL", "CHOLHDL", "VLDL", "LDLCALC", "LDLDIRECT"   Risk Assessment/Calculations:              Physical Exam:    VS:  BP (!) 142/72   Pulse (!) 51   Ht 6\' 8"  (2.032 m)   Wt 224 lb (101.6 kg)   SpO2 95%   BMI 24.61 kg/m     Wt Readings from Last 3 Encounters:  06/30/22 224 lb (101.6 kg)  02/07/22 240 lb (108.9 kg)  01/06/22 237 lb (107.5 kg)     GEN:  Well nourished, well developed in no acute distress HEENT: Normal NECK: No JVD; No carotid bruits LYMPHATICS: No lymphadenopathy CARDIAC: RRR, 2/6 SM,no rubs, gallops RESPIRATORY:  Clear to auscultation without rales, wheezing or rhonchi  ABDOMEN: Soft, non-tender, non-distended MUSCULOSKELETAL:  No edema; right below-knee amputation prosthesis in  place SKIN: Warm and dry NEUROLOGIC:  Alert and oriented x 3 PSYCHIATRIC:  Normal affect   ASSESSMENT:    1. Essential hypertension   2. Encounter for pre-operative cardiovascular clearance   3. Coronary artery disease involving native coronary artery of native heart without angina pectoris    PLAN:    In order of problems listed above:  Pre op colonoscopy - He may proceed with low overall cardiac risk.  If he must, can hold his aspirin 81 mg.  Aortic aneurysm ascending Ascending aorta 5 cm on recent CT scan 12/30/2021.  Personally reviewed and interpreted.  Of course he is 6 foot 8.  Indexed this is clearly different than the standard norms. Has seen Dr. Dorris Fetch.    Timing of surgery usually is around 5.5 cm but we may actually give him some leeway given his height of 6 foot 8.  Appreciate Dr. Sunday Corn opinion.  Reviewed his note.  He does understand that the larger of the diameter the more chance for dissection or rupture.  Avoid heavy Valsalva type maneuvers. -He did have an uncle on his mother side that was 6 foot 8 as well.  He died of pancreatic cancer.  He has no family history of aneurysm rupture.   Trifascicular block Stable on EKG.  May need pacemaker in the future.  Overall doing well without any high risk symptoms such as syncope.   Coronary artery disease involving native coronary artery of native heart without angina pectoris Prior PCI to ramus in 2006, 2 prior stents to the proximal LAD.  Diffuse distal disease in the RCA.  Prior nuclear stress in 2017 showed a fixed inferior defect.  Overall stable.  Continue with  aspirin 81 mg, he is not on beta-blocker because of conduction delays.   S/P transmetatarsal amputation of foot,right Stable.   HYPERLIPIDEMIA, MIXED Atorvastatin 10 mg.  No myalgias.  LDL 60.  Excellent.  No changes made.   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  Overall doing well.  Maximal weight at 1 point was 361  pounds.   Hypotension Previously we decreased his lisinopril from 5 down to 2.5 mg because of blood pressure decreased/lightheadedness when getting up from a seated position.  Improved.  He will continue with low-dose lisinopril to help with renal protection in diabetes.  Has some whitecoat hypertension.  Mildly elevated today.  Usually normal.   Aortic stenosis In 2023 his aortic stenosis was mild.  I do appreciate advancement of murmur on exam today.   He is not having any shortness of breath.   Peripheral vascular disease Continue with aspirin, statin, good blood pressure control.  Good diabetes control.   Hip fracture (HCC) Unfortunately had right hip replacement 7 days after his right shoulder replacement.  Fell mechanically while trying to put his right leg prosthesis on.      Medication Adjustments/Labs and Tests Ordered: Current medicines are reviewed at length with the patient today.  Concerns regarding medicines are outlined above.  Orders Placed This Encounter  Procedures   EKG 12-Lead   No orders of the defined types were placed in this encounter.   Patient Instructions  Medication Instructions:  The current medical regimen is effective;  continue present plan and medications.  *If you need a refill on your cardiac medications before your next appointment, please call your pharmacy*  OK to proceed with colonoscopy - OK to hold Aspirin if necessary.  Follow-Up: At [facility], you and your health needs are our priority.  As part of our continuing mission to provide you with exceptional heart care, we have created designated Provider Care Teams.  These Care Teams include your primary Cardiologist (physician) and Advanced Practice Providers (APPs -  Physician Assistants and Nurse Practitioners) who all work together to provide you with the care you need, when you need it.  We recommend signing up for the patient portal called "MyChart".  Sign up information is  provided on this After Visit Summary.  MyChart is used to connect with patients for Virtual Visits (Telemedicine).  Patients are able to view lab/test results, encounter notes, upcoming appointments, etc.  Non-urgent messages can be sent to your provider as well.   To learn more about what you can do with MyChart, go to [email or web address].    Your next appointment:   1 year(s)  Provider:   Donato Schultz, MD        Signed, Donato Schultz, MD  06/30/2022 10:37 AM    [facility]

## 2022-07-31 ENCOUNTER — Ambulatory Visit (INDEPENDENT_AMBULATORY_CARE_PROVIDER_SITE_OTHER): Payer: Medicare Other | Admitting: Orthopedic Surgery

## 2022-07-31 ENCOUNTER — Encounter: Payer: Self-pay | Admitting: Orthopedic Surgery

## 2022-07-31 DIAGNOSIS — L97521 Non-pressure chronic ulcer of other part of left foot limited to breakdown of skin: Secondary | ICD-10-CM

## 2022-07-31 NOTE — Progress Notes (Signed)
Office Visit Note   Patient: [name]           Date of Birth: 28-Jan-1950           MRN: [id number] Visit Date: 07/31/2022              Requested by: Thana Ates, MD [address] [place],  [place] [address] PCP: Thana Ates, MD  Chief Complaint  Patient presents with   Left Foot - Wound Check    Hx Left transmet amputation      HPI: Patient is a 72 year old gentleman status post left transmetatarsal amputation with a plantar Wagner grade 1 ulcer.  Assessment & Plan: Visit Diagnoses:  1. Non-pressure chronic ulcer of other part of left foot limited to breakdown of skin (HCC)     Plan: The ulcer size continues to improve.  Will continue current wound care reevaluate in 4 weeks.  Patient may benefit from additional Kerecis tissue graft  Follow-Up Instructions: Return in about 4 weeks (around 08/28/2022).   Ortho Exam  Patient is alert, oriented, no adenopathy, well-dressed, normal affect, normal respiratory effort. Examination the ulcer has hypertrophic callus.  There is no cellulitis no drainage.  After informed consent a 10 blade knife was used to debride the skin and soft tissue back to healthy viable granulation tissue.  This was touched with silver nitrate.  Wound measures 1 cm diameter and 2 mm deep after debridement.  Imaging: No results found.   Labs: Lab Results  Component Value Date   HGBA1C 6.8 (H) 03/28/2020   HGBA1C 6.5 (H) 09/26/2015   HGBA1C 7.0 (H) 06/04/2014   REPTSTATUS 08/01/2016 FINAL 07/30/2016   GRAMSTAIN  06/04/2014    MODERATE WBC PRESENT, PREDOMINANTLY PMN NO SQUAMOUS EPITHELIAL CELLS SEEN MODERATE GRAM POSITIVE COCCI IN CLUSTERS Performed at [facility]    GRAMSTAIN  06/04/2014    MODERATE WBC PRESENT, PREDOMINANTLY PMN NO SQUAMOUS EPITHELIAL CELLS SEEN MODERATE GRAM POSITIVE COCCI IN PAIRS Performed at [facility]    CULT  07/30/2016    NO GROWTH Performed at [facility], [address] [place], [place] [address]      Lab Results  Component Value Date   ALBUMIN 3.9 08/08/2021   ALBUMIN 3.5 03/28/2020   ALBUMIN 3.5 03/28/2020    Lab Results  Component Value Date   MG 1.8 08/08/2021   MG 2.0 04/02/2020   Lab Results  Component Value Date   VD25OH 53.03 03/28/2020    No results found for: "PREALBUMIN"    Latest Ref Rng & Units 08/08/2021    2:50 AM 04/01/2020    3:31 AM 03/31/2020    5:23 AM  CBC EXTENDED  WBC 4.0 - 10.5 K/uL 6.8  8.6  9.0   RBC 4.22 - 5.81 MIL/uL 5.17  3.92  3.98   Hemoglobin 13.0 - 17.0 g/dL 82.9  56.2  13.0   HCT 39.0 - 52.0 % 47.2  36.0  36.0   Platelets 150 - 400 K/uL 197  205  181   NEUT# 1.7 - 7.7 K/uL 4.4     Lymph# 0.7 - 4.0 K/uL 1.3        There is no height or weight on file to calculate BMI.  Orders:  No orders of the defined types were placed in this encounter.  No orders of the defined types were placed in this encounter.    Procedures: No procedures performed  Clinical Data:  No additional findings.  ROS:  All other systems negative, except as noted in the HPI. Review of Systems  Objective: Vital Signs: There were no vitals taken for this visit.  Specialty Comments:  No specialty comments available.  PMFS History: Patient Active Problem List   Diagnosis Date Noted   Hypotension 01/07/2021   S/P arthroscopy of right shoulder 06/02/2020   Closed fracture of neck of right femur (HCC)    Hip fracture (HCC) 03/27/2020   Nontraumatic complete tear of right rotator cuff 02/26/2020   S/P transmetatarsal amputation of foot, left (HCC) 04/25/2016   Chronic osteomyelitis of toe, left (HCC)    Chronic diastolic CHF (congestive heart failure) (HCC) 09/25/2015   Aortic stenosis 08/04/2014   Diabetic foot infection (HCC) 06/03/2014   Peripheral vascular disease (HCC) 06/03/2014   Coronary artery disease involving native coronary artery of native heart without angina pectoris 07/29/2013   Trifascicular block  07/29/2013   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 remote stenting to ramus, prox LAD x2.   Dental crowns present    Diabetes mellitus    First degree AV block    GERD (gastroesophageal reflux disease)    Heart attack (HCC) 06/2004   Heart murmur    aortic   Hx MRSA infection 02/2006   Hyperlipidemia    Hypertension    hx. of - has not been on med. since losing wt. after gastric bypass   Hypothyroidism    Morbid obesity (HCC)    Mucoid cyst of joint 09/2011   left thumb   Sleep apnea    sleep study 03/29/2011; no CPAP use, lost >130lbs   Trifascicular block     Family History  Problem Relation Age of Onset   Heart disease Mother     Past Surgical History:  Procedure Laterality Date   AMPUTATION Left 04/19/2016   Procedure: Left Foot 4th Toe Amputation vs. Transmetatarsal;  Surgeon: Nadara Mustard, MD;  Location: [facility];  Service: Orthopedics;  Laterality: Left;   BIOPSY  04/07/2019   Procedure: BIOPSY;  Surgeon: Kathi Der, MD;  Location: [facility];  Service: Gastroenterology;;   CATARACT EXTRACTION  02/2010; 03/2010   COLONOSCOPY WITH PROPOFOL N/A 09/14/2014   Procedure: COLONOSCOPY WITH PROPOFOL;  Surgeon: Charolett Bumpers, MD;  Location: [facility];  Service: Endoscopy;  Laterality: N/A;   COLONOSCOPY WITH PROPOFOL N/A 04/07/2019   Procedure: COLONOSCOPY WITH PROPOFOL;  Surgeon: Kathi Der, MD;  Location: [facility];  Service: Gastroenterology;  Laterality: N/A;   CORONARY ANGIOPLASTY WITH STENT PLACEMENT  07/12/2004; 08/03/2004   total of 3 stents   I & D EXTREMITY Right 06/04/2014   Procedure: IRRIGATION AND DEBRIDEMENT  EXTREMITY;  Surgeon: Kathryne Hitch, MD;  Location: [facility] OR;  Service: Orthopedics;  Laterality: Right;   MASS EXCISION  10/04/2011   Procedure: EXCISION MASS;  Surgeon: Nicki Reaper, MD;  Location: [facility];  Service: Orthopedics;  Laterality: Left;  excision cyst debridment ip joint of left thumb   PROSTATECTOMY     right below knee amputation  2018   ROUX-EN-Y GASTRIC BYPASS  10/10/2010   laparoscopic   SHOULDER ARTHROSCOPY WITH ROTATOR CUFF REPAIR AND SUBACROMIAL DECOMPRESSION Right 03/23/2020   Procedure: RIGHT SHOULDER ARTHROSCOPY WITH DEBRIDEMENT AND  ROTATOR CUFF REPAIR;  Surgeon: Kathryne Hitch, MD;  Location: [facility];  Service: Orthopedics;  Laterality: Right;   TOE AMPUTATION  02/23/2006   left foot second ray amputation   TOTAL HIP ARTHROPLASTY Right 03/29/2020   Procedure: TOTAL HIP ARTHROPLASTY POSTERIOR APPROACH;  Surgeon: Tarry Kos, MD;  Location: [facility];  Service: Orthopedics;  Laterality: Right;   TRANSMETATARSAL AMPUTATION Left    TRIGGER FINGER RELEASE Right 09/16/2019   Procedure: RELEASE TRIGGER FINGER/A-1 PULLEY;  Surgeon: Cindee Salt, MD;  Location: [facility];  Service: Orthopedics;  Laterality: Right;  IV REGIONAL FOREARM BLOCK   Social History   Occupational History   Occupation: IT trainer taxes  Tobacco Use   Smoking status: Never   Smokeless tobacco: Never  Substance and Sexual Activity   Alcohol use: No   Drug use: No   Sexual activity: Yes

## 2022-08-08 DIAGNOSIS — L98499 Non-pressure chronic ulcer of skin of other sites with unspecified severity: Secondary | ICD-10-CM | POA: Diagnosis not present

## 2022-08-08 DIAGNOSIS — L97811 Non-pressure chronic ulcer of other part of right lower leg limited to breakdown of skin: Secondary | ICD-10-CM | POA: Diagnosis not present

## 2022-08-13 ENCOUNTER — Encounter (INDEPENDENT_AMBULATORY_CARE_PROVIDER_SITE_OTHER): Payer: Medicare Other | Admitting: Cardiology

## 2022-08-13 DIAGNOSIS — I951 Orthostatic hypotension: Secondary | ICD-10-CM | POA: Diagnosis not present

## 2022-08-14 NOTE — Telephone Encounter (Signed)
Please see the MyChart message reply(ies) for my assessment and plan.    Overall, I am very satisfied with your BP readings. Yes there was a low/ high outlier, but this is quite typical.  Agree with the hydration.  No changes made.  Thanks for the update.   This patient gave consent for this Medical Advice Message and is aware that it may result in a bill to [employer], as well as the possibility of receiving a bill for a co-payment or deductible. They are an established patient, but are not seeking medical advice exclusively about a problem treated during an in person or video visit in the last seven days. I did not recommend an in person or video visit within seven days of my reply.    I spent a total of 6 minutes cumulative time within 7 days through [employer].  Donato Schultz, MD

## 2022-08-21 DIAGNOSIS — Z1211 Encounter for screening for malignant neoplasm of colon: Secondary | ICD-10-CM | POA: Diagnosis not present

## 2022-08-21 DIAGNOSIS — D120 Benign neoplasm of cecum: Secondary | ICD-10-CM | POA: Diagnosis not present

## 2022-08-21 DIAGNOSIS — D125 Benign neoplasm of sigmoid colon: Secondary | ICD-10-CM | POA: Diagnosis not present

## 2022-08-21 DIAGNOSIS — K648 Other hemorrhoids: Secondary | ICD-10-CM | POA: Diagnosis not present

## 2022-08-22 ENCOUNTER — Encounter (HOSPITAL_BASED_OUTPATIENT_CLINIC_OR_DEPARTMENT_OTHER): Payer: Self-pay | Admitting: Emergency Medicine

## 2022-08-22 ENCOUNTER — Encounter: Payer: Self-pay | Admitting: Thoracic Surgery (Cardiothoracic Vascular Surgery)

## 2022-08-22 ENCOUNTER — Other Ambulatory Visit: Payer: Self-pay

## 2022-08-22 ENCOUNTER — Ambulatory Visit: Payer: Medicare Other | Admitting: Thoracic Surgery (Cardiothoracic Vascular Surgery)

## 2022-08-22 ENCOUNTER — Emergency Department (HOSPITAL_BASED_OUTPATIENT_CLINIC_OR_DEPARTMENT_OTHER): Payer: Medicare Other

## 2022-08-22 ENCOUNTER — Inpatient Hospital Stay (HOSPITAL_BASED_OUTPATIENT_CLINIC_OR_DEPARTMENT_OTHER)
Admission: EM | Admit: 2022-08-22 | Discharge: 2022-08-24 | DRG: 322 | Disposition: A | Discharge status: Home / Self Care | Payer: Medicare Other | Attending: Internal Medicine | Admitting: Internal Medicine

## 2022-08-22 ENCOUNTER — Ambulatory Visit
Admission: RE | Admit: 2022-08-22 | Discharge: 2022-08-22 | Disposition: A | Discharge status: Home / Self Care | Payer: Medicare Other | Source: Ambulatory Visit | Attending: Thoracic Surgery (Cardiothoracic Vascular Surgery) | Admitting: Thoracic Surgery (Cardiothoracic Vascular Surgery)

## 2022-08-22 VITALS — BP 99/69 | HR 89 | Resp 18 | Ht >= 80 in | Wt 223.0 lb

## 2022-08-22 DIAGNOSIS — Z8614 Personal history of Methicillin resistant Staphylococcus aureus infection: Secondary | ICD-10-CM

## 2022-08-22 DIAGNOSIS — Z881 Allergy status to other antibiotic agents status: Secondary | ICD-10-CM

## 2022-08-22 DIAGNOSIS — E11610 Type 2 diabetes mellitus with diabetic neuropathic arthropathy: Secondary | ICD-10-CM | POA: Diagnosis present

## 2022-08-22 DIAGNOSIS — K219 Gastro-esophageal reflux disease without esophagitis: Secondary | ICD-10-CM | POA: Diagnosis present

## 2022-08-22 DIAGNOSIS — I214 Non-ST elevation (NSTEMI) myocardial infarction: Secondary | ICD-10-CM | POA: Diagnosis not present

## 2022-08-22 DIAGNOSIS — Z7984 Long term (current) use of oral hypoglycemic drugs: Secondary | ICD-10-CM

## 2022-08-22 DIAGNOSIS — E1151 Type 2 diabetes mellitus with diabetic peripheral angiopathy without gangrene: Secondary | ICD-10-CM | POA: Diagnosis present

## 2022-08-22 DIAGNOSIS — Z96641 Presence of right artificial hip joint: Secondary | ICD-10-CM | POA: Diagnosis present

## 2022-08-22 DIAGNOSIS — Z8249 Family history of ischemic heart disease and other diseases of the circulatory system: Secondary | ICD-10-CM

## 2022-08-22 DIAGNOSIS — I440 Atrioventricular block, first degree: Secondary | ICD-10-CM | POA: Diagnosis not present

## 2022-08-22 DIAGNOSIS — D120 Benign neoplasm of cecum: Secondary | ICD-10-CM | POA: Diagnosis not present

## 2022-08-22 DIAGNOSIS — Z955 Presence of coronary angioplasty implant and graft: Secondary | ICD-10-CM

## 2022-08-22 DIAGNOSIS — E039 Hypothyroidism, unspecified: Secondary | ICD-10-CM | POA: Diagnosis present

## 2022-08-22 DIAGNOSIS — Z89422 Acquired absence of other left toe(s): Secondary | ICD-10-CM

## 2022-08-22 DIAGNOSIS — I739 Peripheral vascular disease, unspecified: Secondary | ICD-10-CM | POA: Diagnosis present

## 2022-08-22 DIAGNOSIS — I7121 Aneurysm of the ascending aorta, without rupture: Secondary | ICD-10-CM | POA: Diagnosis not present

## 2022-08-22 DIAGNOSIS — Z885 Allergy status to narcotic agent status: Secondary | ICD-10-CM

## 2022-08-22 DIAGNOSIS — R911 Solitary pulmonary nodule: Secondary | ICD-10-CM | POA: Diagnosis present

## 2022-08-22 DIAGNOSIS — I2510 Atherosclerotic heart disease of native coronary artery without angina pectoris: Secondary | ICD-10-CM | POA: Diagnosis not present

## 2022-08-22 DIAGNOSIS — Z7982 Long term (current) use of aspirin: Secondary | ICD-10-CM

## 2022-08-22 DIAGNOSIS — Z9841 Cataract extraction status, right eye: Secondary | ICD-10-CM

## 2022-08-22 DIAGNOSIS — I252 Old myocardial infarction: Secondary | ICD-10-CM

## 2022-08-22 DIAGNOSIS — Z9884 Bariatric surgery status: Secondary | ICD-10-CM | POA: Diagnosis not present

## 2022-08-22 DIAGNOSIS — Z79890 Hormone replacement therapy (postmenopausal): Secondary | ICD-10-CM | POA: Diagnosis not present

## 2022-08-22 DIAGNOSIS — I453 Trifascicular block: Secondary | ICD-10-CM | POA: Diagnosis not present

## 2022-08-22 DIAGNOSIS — E876 Hypokalemia: Secondary | ICD-10-CM | POA: Diagnosis present

## 2022-08-22 DIAGNOSIS — Z79899 Other long term (current) drug therapy: Secondary | ICD-10-CM | POA: Diagnosis not present

## 2022-08-22 DIAGNOSIS — E785 Hyperlipidemia, unspecified: Secondary | ICD-10-CM | POA: Diagnosis present

## 2022-08-22 DIAGNOSIS — Z880 Allergy status to penicillin: Secondary | ICD-10-CM

## 2022-08-22 DIAGNOSIS — I495 Sick sinus syndrome: Secondary | ICD-10-CM | POA: Diagnosis not present

## 2022-08-22 DIAGNOSIS — I480 Paroxysmal atrial fibrillation: Secondary | ICD-10-CM | POA: Diagnosis present

## 2022-08-22 DIAGNOSIS — I4891 Unspecified atrial fibrillation: Principal | ICD-10-CM | POA: Diagnosis present

## 2022-08-22 DIAGNOSIS — R001 Bradycardia, unspecified: Secondary | ICD-10-CM | POA: Diagnosis present

## 2022-08-22 DIAGNOSIS — Z89511 Acquired absence of right leg below knee: Secondary | ICD-10-CM | POA: Diagnosis not present

## 2022-08-22 DIAGNOSIS — Z888 Allergy status to other drugs, medicaments and biological substances status: Secondary | ICD-10-CM

## 2022-08-22 DIAGNOSIS — E1159 Type 2 diabetes mellitus with other circulatory complications: Secondary | ICD-10-CM | POA: Diagnosis present

## 2022-08-22 DIAGNOSIS — I771 Stricture of artery: Secondary | ICD-10-CM | POA: Diagnosis not present

## 2022-08-22 DIAGNOSIS — Z9842 Cataract extraction status, left eye: Secondary | ICD-10-CM

## 2022-08-22 DIAGNOSIS — I110 Hypertensive heart disease with heart failure: Secondary | ICD-10-CM | POA: Diagnosis present

## 2022-08-22 DIAGNOSIS — D125 Benign neoplasm of sigmoid colon: Secondary | ICD-10-CM | POA: Diagnosis not present

## 2022-08-22 DIAGNOSIS — Z9079 Acquired absence of other genital organ(s): Secondary | ICD-10-CM

## 2022-08-22 LAB — CBC
HCT: 43.8 % (ref 39.0–52.0)
Hemoglobin: 14.8 g/dL (ref 13.0–17.0)
MCH: 30.5 pg (ref 26.0–34.0)
MCHC: 33.8 g/dL (ref 30.0–36.0)
MCV: 90.3 fL (ref 80.0–100.0)
Platelets: 236 10*3/uL (ref 150–400)
RBC: 4.85 MIL/uL (ref 4.22–5.81)
RDW: 13.5 % (ref 11.5–15.5)
WBC: 9.5 10*3/uL (ref 4.0–10.5)
nRBC: 0 % (ref 0.0–0.2)

## 2022-08-22 LAB — TROPONIN I (HIGH SENSITIVITY)
Troponin I (High Sensitivity): 24 ng/L — ABNORMAL HIGH (ref <18)
Troponin I (High Sensitivity): 2333 ng/L (ref <18)
Troponin I (High Sensitivity): 7113 ng/L (ref <18)
Troponin I (High Sensitivity): 11237 ng/L (ref <18)

## 2022-08-22 LAB — HEPATIC FUNCTION PANEL
ALT: 14 U/L (ref 0–44)
AST: 21 U/L (ref 15–41)
Albumin: 4.1 g/dL (ref 3.5–5.0)
Alkaline Phosphatase: 49 U/L (ref 38–126)
Bilirubin, Direct: 0.2 mg/dL (ref 0.0–0.2)
Indirect Bilirubin: 0.4 mg/dL (ref 0.3–0.9)
Total Bilirubin: 0.6 mg/dL (ref 0.3–1.2)
Total Protein: 6.7 g/dL (ref 6.5–8.1)

## 2022-08-22 LAB — BASIC METABOLIC PANEL
Anion gap: 11 (ref 5–15)
BUN: 9 mg/dL (ref 8–23)
CO2: 21 mmol/L — ABNORMAL LOW (ref 22–32)
Calcium: 8.9 mg/dL (ref 8.9–10.3)
Chloride: 106 mmol/L (ref 98–111)
Creatinine, Ser: 0.76 mg/dL (ref 0.61–1.24)
GFR, Estimated: >60 mL/min (ref >60)
Glucose, Bld: 148 mg/dL — ABNORMAL HIGH (ref 70–99)
Potassium: 3.6 mmol/L (ref 3.5–5.1)
Sodium: 138 mmol/L (ref 135–145)

## 2022-08-22 LAB — BRAIN NATRIURETIC PEPTIDE: B Natriuretic Peptide: 280.8 pg/mL — ABNORMAL HIGH (ref 0.0–100.0)

## 2022-08-22 LAB — TSH: TSH: 0.151 u[IU]/mL — ABNORMAL LOW (ref 0.350–4.500)

## 2022-08-22 LAB — T4, FREE: Free T4: 1.72 ng/dL — ABNORMAL HIGH (ref 0.61–1.12)

## 2022-08-22 LAB — MAGNESIUM: Magnesium: 1.8 mg/dL (ref 1.7–2.4)

## 2022-08-22 MED ORDER — ASPIRIN 81 MG PO CHEW
324.0000 mg | CHEWABLE_TABLET | ORAL | Status: AC
  Administered 2022-08-23: 324 mg via ORAL
  Filled 2022-08-22: qty 4

## 2022-08-22 MED ORDER — SODIUM CHLORIDE 0.9 % IV SOLN: INTRAVENOUS | Status: DC | PRN

## 2022-08-22 MED ORDER — METOPROLOL TARTRATE 5 MG/5ML IV SOLN
5.0000 mg | INTRAVENOUS | Status: DC | PRN
  Administered 2022-08-22: 5 mg via INTRAVENOUS
  Filled 2022-08-22: qty 5

## 2022-08-22 MED ORDER — HEPARIN BOLUS VIA INFUSION
4000.0000 [IU] | Freq: Once | INTRAVENOUS | Status: AC
  Administered 2022-08-22: 4000 [IU] via INTRAVENOUS

## 2022-08-22 MED ORDER — MAGNESIUM SULFATE IN D5W 1-5 GM/100ML-% IV SOLN
1.0000 g | Freq: Once | INTRAVENOUS | Status: AC
  Administered 2022-08-22: 1 g via INTRAVENOUS
  Filled 2022-08-22 (×2): qty 100

## 2022-08-22 MED ORDER — HEPARIN (PORCINE) 25000 UT/250ML-% IV SOLN
1600.0000 [IU]/h | INTRAVENOUS | Status: DC
  Administered 2022-08-22: 1200 [IU]/h via INTRAVENOUS
  Administered 2022-08-23: 1400 [IU]/h via INTRAVENOUS
  Filled 2022-08-22 (×2): qty 250

## 2022-08-22 MED ORDER — POTASSIUM CHLORIDE CRYS ER 20 MEQ PO TBCR
40.0000 meq | EXTENDED_RELEASE_TABLET | Freq: Once | ORAL | Status: AC
  Administered 2022-08-22: 40 meq via ORAL
  Filled 2022-08-22: qty 2

## 2022-08-22 MED ORDER — LACTATED RINGERS IV BOLUS
500.0000 mL | Freq: Once | INTRAVENOUS | Status: AC
  Administered 2022-08-22: 500 mL via INTRAVENOUS

## 2022-08-22 MED ORDER — ASPIRIN 81 MG PO CHEW
81.0000 mg | CHEWABLE_TABLET | Freq: Every day | ORAL | Status: DC
  Filled 2022-08-22: qty 1

## 2022-08-22 MED ORDER — IOPAMIDOL (ISOVUE-370) INJECTION 76%
75.0000 mL | Freq: Once | INTRAVENOUS | Status: AC | PRN
  Administered 2022-08-22: 75 mL via INTRAVENOUS

## 2022-08-22 NOTE — ED Notes (Signed)
Monesha at [facility] will send transport for Bed Ready at [facility] 3E RM# 10.-ABB(NS)

## 2022-08-22 NOTE — ED Notes (Signed)
Date and time results received: 08/22/22 1952 (use smartphrase ".now" to insert current time)  Test: trop Critical Value: 7113  Name of Provider Notified: dixon  Orders Received? Or Actions Taken?:  pt tranferred for NSTEMI protocol

## 2022-08-22 NOTE — H&P (Signed)
History and Physical    Patient: [name] [name] DOB: August 21, 1950 DOA: 08/22/2022 DOS: the patient was seen and examined on 08/23/2022 PCP: Thana Ates, MD  Patient coming from:  [facility]  Chief Complaint:  Chief Complaint  Patient presents with   Tachycardia   HPI: [name] is a 72 y.o. male with medical history significant of CAD s/p stents, aortic valve sclerosis, AAA, first degree AV block, trifascicular block, HTN, HLD T2DM, morbid obesity, hypothyroidism who presents with new onset atrial fibrillation.   Patient was seen outpatient by CT surgery for routine follow up surveillance of his aortic aneurysm and reported feeling of dizziness and chest discomfort earlier in the day. He was noted to have tachyarrhythmia in the 140-150 likely atrial fibrillation and sent to ED. He had colonoscopy yesterday with 2 days of prep and had been feeling more fatigue. He also mention episode of palpitation about a year ago with 2 weeks of cardiac monitoring without much findings.   In the ED, he was in atrial fibrillation with RVR with soft blood pressure. He was given 5mg  dose of metoprolol with conversion to sinus rhythm/junctional rhythm. Cardiology Dr. Jacques Navy was consulted by ED physician and transfer with hospitalist admission recommended.  Review of Systems: As mentioned in the history of present illness. All other systems reviewed and are negative. Past Medical History:  Diagnosis Date   Anemia    low iron   Arthritis    Coronary artery disease 2006   a. remote stenting to ramus, prox LAD x2.   Dental crowns present    Diabetes mellitus    First degree AV block    GERD (gastroesophageal reflux disease)    Heart attack (HCC) 06/2004   Heart murmur    aortic   Hx MRSA infection 02/2006   Hyperlipidemia    Hypertension    hx. of - has not been on med. since losing wt. after gastric bypass   Hypothyroidism    Morbid obesity (HCC)    Mucoid cyst of joint 09/2011   left  thumb   Sleep apnea    sleep study 03/29/2011; no CPAP use, lost >130lbs   Trifascicular block    Past Surgical History:  Procedure Laterality Date   AMPUTATION Left 04/19/2016   Procedure: Left Foot 4th Toe Amputation vs. Transmetatarsal;  Surgeon: Nadara Mustard, MD;  Location: [facility];  Service: Orthopedics;  Laterality: Left;   BIOPSY  04/07/2019   Procedure: BIOPSY;  Surgeon: Kathi Der, MD;  [facility]: [facility];  Service: Gastroenterology;;   CATARACT EXTRACTION  02/2010; 03/2010   COLONOSCOPY WITH PROPOFOL N/A 09/14/2014   Procedure: COLONOSCOPY WITH PROPOFOL;  Surgeon: Charolett Bumpers, MD;  Location: [facility];  Service: Endoscopy;  Laterality: N/A;   COLONOSCOPY WITH PROPOFOL N/A 04/07/2019   Procedure: COLONOSCOPY WITH PROPOFOL;  Surgeon: Kathi Der, MD;  Location: [facility];  Service: Gastroenterology;  Laterality: N/A;   CORONARY ANGIOPLASTY WITH STENT PLACEMENT  07/12/2004; 08/03/2004   total of 3 stents   I & D EXTREMITY Right 06/04/2014   Procedure: IRRIGATION AND DEBRIDEMENT EXTREMITY;  Surgeon: Kathryne Hitch, MD;  Location: [facility] OR;  Service: Orthopedics;  Laterality: Right;   MASS EXCISION  10/04/2011   Procedure: EXCISION MASS;  Surgeon: Nicki Reaper, MD;  Location: [facility];  Service: Orthopedics;  Laterality: Left;  excision cyst debridment ip joint of left thumb   PROSTATECTOMY     right below knee amputation  2018   ROUX-EN-Y GASTRIC BYPASS  10/10/2010   laparoscopic   SHOULDER ARTHROSCOPY WITH ROTATOR CUFF REPAIR AND SUBACROMIAL DECOMPRESSION Right 03/23/2020   Procedure: RIGHT SHOULDER ARTHROSCOPY WITH DEBRIDEMENT AND  ROTATOR CUFF REPAIR;  Surgeon: Kathryne Hitch, MD;  Location: [facility];  Service: Orthopedics;  Laterality: Right;   TOE AMPUTATION  02/23/2006   left foot second ray amputation   TOTAL HIP ARTHROPLASTY Right 03/29/2020   Procedure: TOTAL HIP ARTHROPLASTY POSTERIOR APPROACH;  Surgeon: Tarry Kos, MD;  Location:  [facility];  Service: Orthopedics;  Laterality: Right;   TRANSMETATARSAL AMPUTATION Left    TRIGGER FINGER RELEASE Right 09/16/2019   Procedure: RELEASE TRIGGER FINGER/A-1 PULLEY;  Surgeon: Cindee Salt, MD;  Location: [facility];  Service: Orthopedics;  Laterality: Right;  IV REGIONAL FOREARM BLOCK   Social History:  reports that he has never smoked. He has never used smokeless tobacco. He reports that he does not drink alcohol and does not use drugs.  Allergies  Allergen Reactions   Other Other (See Comments)   Moxifloxacin Rash    Avelox Rash at injection site   Penicillins Rash     Has patient had a PCN reaction causing immediate rash, facial/tongue/throat swelling, SOB or lightheadedness with hypotension: #  #  #  YES  #  #  #  Has patient had a PCN reaction causing severe rash involving mucus membranes or skin necrosis: No Has patient had a PCN reaction that required hospitalization unknown Has patient had a PCN reaction occurring within the last 10 years: No If all of the above answers are "NO", then may proceed with Cephalosporin use.   Quinolones Itching   Sulfamethoxazole-Trimethoprim Itching, Rash and Other (See Comments)    Family History  Problem Relation Age of Onset   Heart disease Mother     Prior to Admission medications   Medication Sig Start Date End Date Taking? Authorizing Provider  amoxicillin (AMOXIL) 500 MG tablet Take 4 tablets by mouth 1 hour prior to dental procedure. Patient taking differently: Take 1,000 mg by mouth daily as needed (For dental procedure only). 05/22/22  Yes Tarry Kos, MD  aspirin EC 81 MG tablet Take 81 mg by mouth daily.   Yes [provider]  atorvastatin (LIPITOR) 10 MG tablet Take 10 mg by mouth at bedtime.    Yes [provider]  Calcium Citrate-Vitamin D (CALCIUM CITRATE CHEWY BITE PO) Take 500 mg by mouth 3 (three) times daily.   Yes [provider]  ferrous sulfate 325 (65 FE) MG tablet  Take 325 mg by mouth See admin instructions. Take one tablet by mouth on Monday, Wednesday and Fridays   Yes [provider]  fluticasone (FLONASE) 50 MCG/ACT nasal spray Place 2 sprays into both nostrils daily as needed for allergies.    Yes [provider]  gabapentin (NEURONTIN) 100 MG capsule TAKE 1 CAPSULE (100 MG TOTAL) BY MOUTH 4 (FOUR) TIMES DAILY. WHEN NECESSARY FOR NEUROPATHY PAIN Patient taking differently: Take 100 mg by mouth 4 (four) times daily. 06/30/22  Yes Nadara Mustard, MD  Hypromellose (GENTEAL SEVERE OP) Place 1 drop into the right eye daily as needed (Dry eyes).   Yes [provider]  ketoconazole (NIZORAL) 2 % cream Apply 1 Application topically daily as needed for rash. 04/25/21  Yes [provider]  levothyroxine (SYNTHROID) 150 MCG tablet Take 150 mcg by mouth daily before breakfast.   Yes [provider]  lisinopril (ZESTRIL)  2.5 MG tablet TAKE 1 TABLET BY MOUTH EVERY DAY Patient taking differently: Take 2.5 mg by mouth daily. 11/28/21  Yes Jake Bathe, MD  metFORMIN (GLUCOPHAGE-XR) 500 MG 24 hr tablet Take 1,000 mg by mouth 2 (two) times daily. 08/16/18  Yes [provider]  Multiple Vitamin (MULTIVITAMIN WITH MINERALS) TABS tablet Take 1 tablet by mouth in the morning and at bedtime. Gummies Patient taking differently: Take 2 tablets by mouth in the morning and at bedtime. Gummies 04/02/20  Yes Almon Hercules, MD  Multiple Vitamins-Minerals (PRESERVISION AREDS 2) CAPS Take 1 capsule by mouth daily. 09/06/16  Yes [provider]  nitroGLYCERIN (NITROSTAT) 0.4 MG SL tablet Place 1 tablet (0.4 mg total) under the tongue every 5 (five) minutes x 3 doses as needed for chest pain. 01/20/22  Yes Jake Bathe, MD  Omega 3 1000 MG CAPS Take 1,000 mg by mouth daily.   Yes [provider]  Omeprazole 20 MG TBEC Take 20 mg by mouth daily as needed (acid reflux/ indigestion).  03/07/10  Yes [provider]   Valerian Root 450 MG CAPS Take 450 mg by mouth at bedtime.   Yes [provider]  vitamin B-12 (CYANOCOBALAMIN) 1000 MCG tablet Take 1,000 mcg by mouth 3 (three) times a week. [place], wed, Friday   Yes [provider]    Physical Exam: Vitals:   08/22/22 2027 08/22/22 2114 08/22/22 2317 08/23/22 0037  BP: 99/78  107/72 107/72  Pulse: (!) 49 (!) 45  (!) 45  Resp: (!) 22  15 15   Temp: 97.7 F (36.5 C)  97.7 F (36.5 C) 97.7 F (36.5 C)  TempSrc: Oral  Oral Oral  SpO2: 100% 99%    Weight:    101.1 kg  Height:    6\' 8"  (2.032 m)   Constitutional: NAD, calm, comfortable, pleasant elderly male laying upright in bed Eyes:lids and conjunctivae normal ENMT: Mucous membranes are moist.  Neck: normal, supple Respiratory: clear to auscultation bilaterally, no wheezing, no crackles. Normal respiratory effort. No accessory muscle use.  Cardiovascular: sinus bradycardia, no murmurs / rubs / gallops. No extremity edema.  Abdomen: no tenderness, Bowel sounds positive.  Musculoskeletal: no clubbing / cyanosis. Right LE prosthetic in place.  Skin: no rashes, lesions, ulcers. No induration Neurologic: CN 2-12 grossly intact.   Psychiatric: Normal judgment and insight. Alert and oriented x 3. Normal mood.    Data Reviewed:  See HPI  Assessment and Plan: * Paroxysmal atrial fibrillation with RVR (HCC) -presented with HR 150 and received 5mg  of metoprolol in the ED with subsequent conversion to sinus rhythm and junctional rhythm. Now in sinus bradycardia in 40s and asymptomatic -CHA2DS2-VASc 4- continue IV heparin -cardiology advised holding any further beta-blocker or antiarrhythmic medication -EP to consult in the morning for consideration of device for trifascicular block  NSTEMI (non-ST elevated myocardial infarction) (HCC) -pt continued to have increasing troponin despite resolution of his atrial fibrillation. Troponin 2333 --[phone] -he has known obstructive coronary  artery disease with multiple stents  -cardiology pends for coronary angiogram in the morning. Hold ACE inhibitor.  -obtain TTE -continue IV heparin infusion -daily aspirin  -obtain lipid panel  -holding beta-blocker due to bradycardia   Ascending aortic aneurysm (HCC) -Follows with CT surgery. Recent CT showing stable aneurysm at 5cm  Bradycardia HR in 40s following conversion to sinus rhythm -asymptomatic  -hold any beta-blocker  Peripheral vascular disease (HCC) -continue aspirin and statin  Trifascicular block -hx of first degree  AV block, RBBB and LAFB -EP to consult for consideration of pacemaker  Type 2 diabetes mellitus with vascular disease (HCC) -HbA1C 6.7 -Keep on SSI  Hypothyroidism -appears to been over-corrected with low TSH and high T4 -will decrease dose from to with follow up labs with primary in 6-8 weeks      Advance Care Planning:   Code Status: Full Code   Consults: cardiology  Family Communication: none at bedside  Severity of Illness: The appropriate patient status for this patient is OBSERVATION. Observation status is judged to be reasonable and necessary in order to provide the required intensity of service to ensure the patient's safety. The patient's presenting symptoms, physical exam findings, and initial radiographic and laboratory data in the context of their medical condition is felt to place them at decreased risk for further clinical deterioration. Furthermore, it is anticipated that the patient will be medically stable for discharge from the hospital within 2 midnights of admission.   Author: Anselm Jungling, DO 08/23/2022 1:39 AM  For on call review [email or web address].

## 2022-08-22 NOTE — Care Plan (Signed)
[name] is a 72 y.o. M with ascending aortic aneurysm, trifascicular block, DM, R BKA, CAD s/p PCI 2006, dCHF, hypothyroidism, gastric bypass, who was sent from CT surgery clinic for new onset Afib rates in the 140s.    On arrival to the ER, given fluids and 5 mg IV metoprolol and converted to junctional rhythm, still dizzy and symptomatic.  BP ~90-100.  To progressive bed OBS.  Dr. Durwin Nora spoke with Dr. Jacques Navy from Cardiology who recommended Medicine admission with Cardiology consult tonight on arrival.

## 2022-08-22 NOTE — Progress Notes (Signed)
ANTICOAGULATION CONSULT NOTE  Pharmacy Consult for Heparin Indication: chest pain/ACS  Allergies  Allergen Reactions   Other Other (See Comments)   Moxifloxacin Rash    Avelox Rash at injection site   Penicillins Rash     Has patient had a PCN reaction causing immediate rash, facial/tongue/throat swelling, SOB or lightheadedness with hypotension: #  #  #  YES  #  #  #  Has patient had a PCN reaction causing severe rash involving mucus membranes or skin necrosis: No Has patient had a PCN reaction that required hospitalization unknown Has patient had a PCN reaction occurring within the last 10 years: No If all of the above answers are "NO", then may proceed with Cephalosporin use.   Quinolones Itching   Sulfamethoxazole-Trimethoprim Itching, Rash and Other (See Comments)    Patient Measurements:   Heparin Dosing Weight: 101 kg  Vital Signs: Temp: 98.2 F (36.8 C) (07/30 1929) Temp Source: Oral (07/30 1929) BP: 105/65 (07/30 1845) Pulse Rate: 52 (07/30 1845)  Labs: Recent Labs    08/22/22 1557 08/22/22 1751  HGB 14.8  --   HCT 43.8  --   PLT 236  --   CREATININE 0.76  --   TROPONINIHS 24* 2,333*    Estimated Creatinine Clearance: 110 mL/min (by C-G formula based on SCr of 0.76 mg/dL).   Medical History: Past Medical History:  Diagnosis Date   Anemia    low iron   Arthritis    Coronary artery disease 2006   a. remote stenting to ramus, prox LAD x2.   Dental crowns present    Diabetes mellitus    First degree AV block    GERD (gastroesophageal reflux disease)    Heart attack (HCC) 06/2004   Heart murmur    aortic   Hx MRSA infection 02/2006   Hyperlipidemia    Hypertension    hx. of - has not been on med. since losing wt. after gastric bypass   Hypothyroidism    Morbid obesity (HCC)    Mucoid cyst of joint 09/2011   left thumb   Sleep apnea    sleep study 03/29/2011; no CPAP use, lost >130lbs   Trifascicular block     Assessment: 38 YOM presenting  with new onset Afib. Not on anticoagulation PTA. Pharmacy consulted to dose heparin.  Goal of Therapy:  Heparin level 0.3-0.7 units/ml Monitor platelets by anticoagulation protocol: Yes   Plan:  Heparin 4000 units IV once then 1200 units/hr Check heparin level in 8 hours Daily CBC and heparin level  Eldridge Scot, PharmD Clinical Pharmacist 08/22/2022, 7:36 PM

## 2022-08-22 NOTE — ED Notes (Signed)
EDP notified of HR and BP. Repeat EKG obtained and handed off to MD at bedside.

## 2022-08-22 NOTE — ED Notes (Signed)
Attempted to call report to IP nurse, IP nurse will call back.

## 2022-08-22 NOTE — ED Notes (Signed)
ED TO INPATIENT HANDOFF REPORT  ED Nurse Name and Phone #:    S Name/Age/Gender [name] 72 y.o. male Room/Bed: DB010/DB010  Code Status   Code Status: Prior  Home/SNF/Other Home Patient oriented to: self, place, time, and situation Is this baseline? Yes   Triage Complete: Triage complete  Chief Complaint Bradycardia [R00.1]  Triage Note Had colonoscopy yesterday, had ct for aneurysm today. When seen started feeling jittery. EKG done showed afib hr 140s-150s. Some lightheadedness and dizziness.   Allergies Allergies  Allergen Reactions   Other Other (See Comments)   Moxifloxacin Rash    Avelox Rash at injection site   Penicillins Rash     Has patient had a PCN reaction causing immediate rash, facial/tongue/throat swelling, SOB or lightheadedness with hypotension: #  #  #  YES  #  #  #  Has patient had a PCN reaction causing severe rash involving mucus membranes or skin necrosis: No Has patient had a PCN reaction that required hospitalization unknown Has patient had a PCN reaction occurring within the last 10 years: No If all of the above answers are "NO", then may proceed with Cephalosporin use.   Quinolones Itching   Sulfamethoxazole-Trimethoprim Itching, Rash and Other (See Comments)    Level of Care/Admitting Diagnosis ED Disposition     ED Disposition  Admit   Condition  --   Comment  Hospital Area: [facility] [[id number]]  Level of Care: Progressive [102]  Admit to Progressive based on following criteria: CARDIOVASCULAR & THORACIC of moderate stability with acute coronary syndrome symptoms/low risk myocardial infarction/hypertensive urgency/arrhythmias/heart failure potentially compromising stability and stable post cardiovascular intervention patients.  Interfacility transfer: Yes  May place patient in observation at [facility] or [facility] if equivalent level of care is available:: No  Covid Evaluation: Asymptomatic - no recent  exposure (last 10 days) testing not required  Diagnosis: Bradycardia [191850]  Admitting Physician: Alberteen Sam [[id number]]  Attending Physician: Alberteen Sam [[id number]]          B Medical/Surgery History Past Medical History:  Diagnosis Date   Anemia    low iron   Arthritis    Coronary artery disease 2006   a. remote stenting to ramus, prox LAD x2.   Dental crowns present    Diabetes mellitus    First degree AV block    GERD (gastroesophageal reflux disease)    Heart attack (HCC) 06/2004   Heart murmur    aortic   Hx MRSA infection 02/2006   Hyperlipidemia    Hypertension    hx. of - has not been on med. since losing wt. after gastric bypass   Hypothyroidism    Morbid obesity (HCC)    Mucoid cyst of joint 09/2011   left thumb   Sleep apnea    sleep study 03/29/2011; no CPAP use, lost >130lbs   Trifascicular block    Past Surgical History:  Procedure Laterality Date   AMPUTATION Left 04/19/2016   Procedure: Left Foot 4th Toe Amputation vs. Transmetatarsal;  Surgeon: Nadara Mustard, MD;  Location: [facility] OR;  Service: Orthopedics;  Laterality: Left;   BIOPSY  04/07/2019   Procedure: BIOPSY;  Surgeon: Kathi Der, MD;  Location: [facility];  Service: Gastroenterology;;   CATARACT EXTRACTION  02/2010; 03/2010   COLONOSCOPY WITH PROPOFOL N/A 09/14/2014   Procedure: COLONOSCOPY WITH PROPOFOL;  Surgeon: Charolett Bumpers, MD;  Location: [facility];  Service: Endoscopy;  Laterality: N/A;   COLONOSCOPY WITH  PROPOFOL N/A 04/07/2019   Procedure: COLONOSCOPY WITH PROPOFOL;  Surgeon: Kathi Der, MD;  Location: [facility];  Service: Gastroenterology;  Laterality: N/A;   CORONARY ANGIOPLASTY WITH STENT PLACEMENT  07/12/2004; 08/03/2004   total of 3 stents   I & D EXTREMITY Right 06/04/2014   Procedure: IRRIGATION AND DEBRIDEMENT EXTREMITY;  Surgeon: Kathryne Hitch, MD;  Location: [facility] OR;  Service: Orthopedics;  Laterality: Right;   MASS EXCISION   10/04/2011   Procedure: EXCISION MASS;  Surgeon: Nicki Reaper, MD;  Location: [facility];  Service: Orthopedics;  Laterality: Left;  excision cyst debridment ip joint of left thumb   PROSTATECTOMY     right below knee amputation  2018   ROUX-EN-Y GASTRIC BYPASS  10/10/2010   laparoscopic   SHOULDER ARTHROSCOPY WITH ROTATOR CUFF REPAIR AND SUBACROMIAL DECOMPRESSION Right 03/23/2020   Procedure: RIGHT SHOULDER ARTHROSCOPY WITH DEBRIDEMENT AND  ROTATOR CUFF REPAIR;  Surgeon: Kathryne Hitch, MD;  Location: [facility] OR;  Service: Orthopedics;  Laterality: Right;   TOE AMPUTATION  02/23/2006   left foot second ray amputation   TOTAL HIP ARTHROPLASTY Right 03/29/2020   Procedure: TOTAL HIP ARTHROPLASTY POSTERIOR APPROACH;  Surgeon: Tarry Kos, MD;  Location: [facility] OR;  Service: Orthopedics;  Laterality: Right;   TRANSMETATARSAL AMPUTATION Left    TRIGGER FINGER RELEASE Right 09/16/2019   Procedure: RELEASE TRIGGER FINGER/A-1 PULLEY;  Surgeon: Cindee Salt, MD;  Location: [facility];  Service: Orthopedics;  Laterality: Right;  IV REGIONAL FOREARM BLOCK     A IV Location/Drains/Wounds Patient Lines/Drains/Airways Status     Active Line/Drains/Airways     Name Placement date Placement time Site Days   Peripheral IV 08/22/22 20 G Left Antecubital 08/22/22  1547  Antecubital  less than 1   Incision (Closed) 03/23/20 Shoulder Right 03/23/20  1632  -- 882   Incision (Closed) 03/29/20 Thigh Right 03/29/20  1413  -- 876            Intake/Output Last 24 hours  Intake/Output Summary (Last 24 hours) at 08/22/2022 1805 Last data filed at 08/22/2022 1709 Gross per 24 hour  Intake 558.33 ml  Output --  Net 558.33 ml    Labs/Imaging Results for orders placed or performed during the hospital encounter of 08/22/22 (from the past 48 hour(s))  Basic metabolic panel     Status: Abnormal   Collection Time: 08/22/22  3:57 PM  Result Value Ref Range   Sodium 138 135 - 145  mmol/L   Potassium 3.6 3.5 - 5.1 mmol/L   Chloride 106 98 - 111 mmol/L   CO2 21 (L) 22 - 32 mmol/L   Glucose, Bld 148 (H) 70 - 99 mg/dL    Comment: Glucose reference range applies only to samples taken after fasting for at least 8 hours.   BUN 9 8 - 23 mg/dL   Creatinine, Ser 1.61 0.61 - 1.24 mg/dL   Calcium 8.9 8.9 - 09.6 mg/dL   GFR, Estimated >04 >54 mL/min    Comment: (NOTE) Calculated using the CKD-EPI Creatinine Equation (2021)    Anion gap 11 5 - 15    Comment: Performed at [facility], [address], [place], [place] [address]  Magnesium     Status: None   Collection Time: 08/22/22  3:57 PM  Result Value Ref Range   Magnesium 1.8 1.7 - 2.4 mg/dL    Comment: Performed at [facility], [address] [place], [place] [address]  CBC  Status: None   Collection Time: 08/22/22  3:57 PM  Result Value Ref Range   WBC 9.5 4.0 - 10.5 K/uL   RBC 4.85 4.22 - 5.81 MIL/uL   Hemoglobin 14.8 13.0 - 17.0 g/dL   HCT 08.6 57.8 - 46.9 %   MCV 90.3 80.0 - 100.0 fL   MCH 30.5 26.0 - 34.0 pg   MCHC 33.8 30.0 - 36.0 g/dL   RDW 62.9 52.8 - 41.3 %   Platelets 236 150 - 400 K/uL   nRBC 0.0 0.0 - 0.2 %    Comment: Performed at [facility], [address] [place], [place] [address]  TSH     Status: Abnormal   Collection Time: 08/22/22  3:57 PM  Result Value Ref Range   TSH 0.151 (L) 0.350 - 4.500 uIU/mL    Comment: Performed by a 3rd Generation assay with a functional sensitivity of <=0.01 uIU/mL. Performed at [facility], [address] [place], [place] [address]   Brain natriuretic peptide (IF shortness of breath has been documented this visit)     Status: Abnormal   Collection Time: 08/22/22  3:57 PM  Result Value Ref Range   B Natriuretic Peptide 280.8 (H) 0.0 - 100.0 pg/mL    Comment: Performed at [facility], [address] [place], [place] [address]  Hepatic function panel      Status: None   Collection Time: 08/22/22  3:57 PM  Result Value Ref Range   Total Protein 6.7 6.5 - 8.1 g/dL   Albumin 4.1 3.5 - 5.0 g/dL   AST 21 15 - 41 U/L   ALT 14 0 - 44 U/L   Alkaline Phosphatase 49 38 - 126 U/L   Total Bilirubin 0.6 0.3 - 1.2 mg/dL   Bilirubin, Direct 0.2 0.0 - 0.2 mg/dL   Indirect Bilirubin 0.4 0.3 - 0.9 mg/dL    Comment: Performed at [facility], [address], [place], [place] [address]  Troponin I (High Sensitivity)     Status: Abnormal   Collection Time: 08/22/22  3:57 PM  Result Value Ref Range   Troponin I (High Sensitivity) 24 (H) <18 ng/L    Comment: (NOTE) Elevated high sensitivity troponin I (hsTnI) values and significant  changes across serial measurements may suggest ACS but many other  chronic and acute conditions are known to elevate hsTnI results.  Refer to the "Links" section for chest pain algorithms and additional  guidance. Performed at [facility], [address], [place], [place] [address]    DG Chest [place] 1 View  Result Date: 08/22/2022 CLINICAL DATA:  Atrial fibrillation. EXAM: PORTABLE CHEST 1 VIEW COMPARISON:  X-ray 03/27/2020.  CT angiogram earlier 08/22/2022 FINDINGS: Normal cardiopericardial silhouette. Calcified tortuous aorta. No consolidation, pneumothorax or effusion. No edema. Overlapping cardiac leads. Please see separate dictation of CT of the chest from same day IMPRESSION: No acute cardiopulmonary disease. Electronically Signed   By: Karen Kays M.D.   On: 08/22/2022 16:38   CT ANGIO CHEST AORTA W/CM & OR WO/CM  Result Date: 08/22/2022 CLINICAL DATA:  Thoracic aortic aneurysm. EXAM: CT ANGIOGRAPHY CHEST WITH CONTRAST TECHNIQUE: Multidetector CT imaging of the chest was performed using the standard protocol during bolus administration of intravenous contrast. Multiplanar CT image reconstructions and MIPs were obtained to evaluate the vascular anatomy. RADIATION DOSE REDUCTION:  This exam was performed according to the departmental dose-optimization program which includes automated exposure control, adjustment of the mA and/or kV according to patient  size and/or use of iterative reconstruction technique. CONTRAST:  75mL ISOVUE-370 IOPAMIDOL (ISOVUE-370) INJECTION 76% COMPARISON:  December 30, 2021. FINDINGS: Cardiovascular: Grossly stable 5 cm ascending thoracic aortic aneurysm is noted. No dissection is noted. Great vessels are widely patent without stenosis. Normal cardiac size. Coronary calcifications are noted. Mediastinum/Nodes: Postsurgical changes seen involving distal esophagus. No adenopathy is noted. No definite thyroid abnormality is noted. Lungs/Pleura: No pneumothorax or pleural effusion is noted. Minimal bibasilar subsegmental atelectasis or scarring is noted. Stable 5 mm nodule is noted in right middle lobe best seen on image number 105 of series 11. Upper Abdomen: Cholelithiasis.  Status post gastric bypass. Musculoskeletal: No chest wall abnormality. No acute or significant osseous findings. Review of the MIP images confirms the above findings. IMPRESSION: Grossly stable 5 cm Ascending thoracic aortic aneurysm. Recommend semi-annual imaging followup by CTA or MRA and referral to cardiothoracic surgery if not already obtained. This recommendation follows 2010 ACCF/AHA/AATS/ACR/ASA/SCA/SCAI/SIR/STS/SVM Guidelines for the Diagnosis and Management of Patients With Thoracic Aortic Disease. Circulation. 2010; 121: W098-J191. Aortic aneurysm NOS (ICD10-I71.9). Stable 5 mm nodule is noted in right middle lobe. Attention to this on follow-up imaging is recommended. Coronary artery calcifications are noted suggesting coronary artery disease. Cholelithiasis. Status post gastric bypass. Aortic Atherosclerosis (ICD10-I70.0). Electronically Signed   By: Lupita Raider M.D.   On: 08/22/2022 13:52    Pending Labs Unresulted Labs (From admission, onward)     Start     Ordered    08/22/22 1647  T3, free  Once,   URGENT        08/22/22 1646   08/22/22 1647  T4, free  Once,   URGENT        08/22/22 1646            Vitals/Pain Today's Vitals   08/22/22 1700 08/22/22 1705 08/22/22 1715 08/22/22 1745  BP: 92/63 103/62 104/63 105/69  Pulse: (!) 55 (!) 52 (!) 50 (!) 50  Resp: 20 17 12 15   Temp:      TempSrc:      SpO2: 91% 98% 98% 100%  PainSc:        Isolation Precautions No active isolations  Medications Medications  magnesium sulfate IVPB 1 g 100 mL (1 g Intravenous New Bag/Given 08/22/22 1739)  0.9 %  sodium chloride infusion (has no administration in time range)  lactated ringers bolus 500 mL (0 mLs Intravenous Stopped 08/22/22 1709)  potassium chloride SA (KLOR-CON M) CR tablet 40 mEq (40 mEq Oral Given 08/22/22 1727)    Mobility Right BKA, uses cane baseline     Focused Assessments Cardiac Assessment Handoff:  Cardiac Rhythm: Sinus tachycardia Lab Results  Component Value Date   TROPONINI <0.03 09/26/2015   No results found for: "DDIMER" Does the Patient currently have chest pain? No    R Recommendations: See Admitting Provider Note  Report given to:   Additional Notes:

## 2022-08-22 NOTE — Consult Note (Addendum)
Cardiology Consultation   Patient ID: [name] MRN: [id number]; DOB: April 11, 1950  Admit date: 08/22/2022 Date of Consult: 08/22/2022  PCP:  Thana Ates, MD   [facility] Providers Cardiologist:  Donato Schultz, MD        Patient Profile:   ABDULAHI [name] is a 72 y.o. male with a hx of obstructive CAD (PCI 2006 ramus and LAD with diffused RCA disease), HTN, HLD, DM, OSA, gastric bypass who is being seen 08/22/2022 for the evaluation of atrial fibrillation with RVR and acute myocardial injury at the request of Dr. Maryfrances Bunnell.  History of Present Illness:   [name] was in his usual state of health until today, 7/30.  He underwent colonoscopy yesterday and states he required "double prep".  Reports that the colonoscopy was uncomplicated and 2 polyps were found, 1 of which was removed.  He went for an outpatient, routine CT to monitor his known aortic aneurysm.  Following contrast administration, he notes that he felt uneasiness in his chest with heart racing, though no chest pain.  No known history of contrast allergy.  He then proceeded to CT surgery clinic to review the results and states that he was feeling lightheaded while waiting to be seen.  He related his concerns and was found to have a heart rate in the 150s and ultimately presented to the emergency room for further evaluation.  Denies any recent fevers, chills, congestion, cough, abdominal pain, nausea, vomiting, diarrhea, lower extremity edema, new rash.  States that on 08/12/2022 while on vacation, felt lightheaded without syncope.  Heart rate was in the 60s and BP was in the 110/70s during that time.  Symptoms resolved completely within a few hours with rest and no other intervention.  Thought to be related to dehydration.  Denies any other recent history of presyncope/syncope, exertional dyspnea, chest discomfort or heart racing/palpitations.  Presented to [facility] for further evaluation.  Initially in atrial  fibrillation with RVR with SBP 90-1 100s.  Not requiring supplemental oxygen.  Afebrile. K3.6, creatinine 0.76, mag 1.8, WBC 9.5, Hgb 14.8, PLT 236, TSH 0.151, BNP 280, LFTs WNL.  CTA chest with grossly stable 5 cm ascending thoracic aortic aneurysm and coronary artery calcifications. Sensitivity troponin 24->2333.  Given IV metoprolol 5 mg, LR 500 mL, KCl 40 mEq p.o., mag 1 g IV.  Home cardiac medications include aspirin 81 mg daily, atorvastatin 10 mg daily, lisinopril 2.5 mg daily.   Past Medical History:  Diagnosis Date   Anemia    low iron   Arthritis    Coronary artery disease 2006   a. remote stenting to ramus, prox LAD x2.   Dental crowns present    Diabetes mellitus    First degree AV block    GERD (gastroesophageal reflux disease)    Heart attack (HCC) 06/2004   Heart murmur    aortic   Hx MRSA infection 02/2006   Hyperlipidemia    Hypertension    hx. of - has not been on med. since losing wt. after gastric bypass   Hypothyroidism    Morbid obesity (HCC)    Mucoid cyst of joint 09/2011   left thumb   Sleep apnea    sleep study 03/29/2011; no CPAP use, lost >130lbs   Trifascicular block     Past Surgical History:  Procedure Laterality Date   AMPUTATION Left 04/19/2016   Procedure: Left Foot 4th Toe Amputation vs. Transmetatarsal;  Surgeon: Nadara Mustard, MD;  Location:  [facility] OR;  Service: Orthopedics;  Laterality: Left;   BIOPSY  04/07/2019   Procedure: BIOPSY;  Surgeon: Kathi Der, MD;  Location: [facility];  Service: Gastroenterology;;   CATARACT EXTRACTION  02/2010; 03/2010   COLONOSCOPY WITH PROPOFOL N/A 09/14/2014   Procedure: COLONOSCOPY WITH PROPOFOL;  Surgeon: Charolett Bumpers, MD;  Location: [facility];  Service: Endoscopy;  Laterality: N/A;   COLONOSCOPY WITH PROPOFOL N/A 04/07/2019   Procedure: COLONOSCOPY WITH PROPOFOL;  Surgeon: Kathi Der, MD;  Location: [facility];  Service: Gastroenterology;  Laterality: N/A;   CORONARY ANGIOPLASTY WITH STENT  PLACEMENT  07/12/2004; 08/03/2004   total of 3 stents   I & D EXTREMITY Right 06/04/2014   Procedure: IRRIGATION AND DEBRIDEMENT EXTREMITY;  Surgeon: Kathryne Hitch, MD;  Location: [facility] OR;  Service: Orthopedics;  Laterality: Right;   MASS EXCISION  10/04/2011   Procedure: EXCISION MASS;  Surgeon: Nicki Reaper, MD;  Location: [facility];  Service: Orthopedics;  Laterality: Left;  excision cyst debridment ip joint of left thumb   PROSTATECTOMY     right below knee amputation  2018   ROUX-EN-Y GASTRIC BYPASS  10/10/2010   laparoscopic   SHOULDER ARTHROSCOPY WITH ROTATOR CUFF REPAIR AND SUBACROMIAL DECOMPRESSION Right 03/23/2020   Procedure: RIGHT SHOULDER ARTHROSCOPY WITH DEBRIDEMENT AND  ROTATOR CUFF REPAIR;  Surgeon: Kathryne Hitch, MD;  Location: [facility] OR;  Service: Orthopedics;  Laterality: Right;   TOE AMPUTATION  02/23/2006   left foot second ray amputation   TOTAL HIP ARTHROPLASTY Right 03/29/2020   Procedure: TOTAL HIP ARTHROPLASTY POSTERIOR APPROACH;  Surgeon: Tarry Kos, MD;  Location: [facility] OR;  Service: Orthopedics;  Laterality: Right;   TRANSMETATARSAL AMPUTATION Left    TRIGGER FINGER RELEASE Right 09/16/2019   Procedure: RELEASE TRIGGER FINGER/A-1 PULLEY;  Surgeon: Cindee Salt, MD;  Location: [facility];  Service: Orthopedics;  Laterality: Right;  IV REGIONAL FOREARM BLOCK     Home Medications:  Prior to Admission medications   Medication Sig Start Date End Date Taking? Authorizing Provider  amoxicillin (AMOXIL) 500 MG tablet Take 4 tablets by mouth 1 hour prior to dental procedure. Patient taking differently: Take 1,000 mg by mouth daily as needed (For dental procedure only). 05/22/22  Yes Tarry Kos, MD  aspirin EC 81 MG tablet Take 81 mg by mouth daily.   Yes [provider]  atorvastatin (LIPITOR) 10 MG tablet Take 10 mg by mouth at bedtime.    Yes [provider]  Calcium Citrate-Vitamin D (CALCIUM CITRATE CHEWY BITE PO)  Take 500 mg by mouth 3 (three) times daily.   Yes [provider]  ferrous sulfate 325 (65 FE) MG tablet Take 325 mg by mouth See admin instructions. Take one tablet by mouth on Monday, Wednesday and Fridays   Yes [provider]  fluticasone (FLONASE) 50 MCG/ACT nasal spray Place 2 sprays into both nostrils daily as needed for allergies.    Yes [provider]  gabapentin (NEURONTIN) 100 MG capsule TAKE 1 CAPSULE (100 MG TOTAL) BY MOUTH 4 (FOUR) TIMES DAILY. WHEN NECESSARY FOR NEUROPATHY PAIN Patient taking differently: Take 100 mg by mouth 4 (four) times daily. 06/30/22  Yes Nadara Mustard, MD  Hypromellose (GENTEAL SEVERE OP) Place 1 drop into the right eye daily as needed (Dry eyes).   Yes [provider]  ketoconazole (NIZORAL) 2 % cream Apply 1 Application topically daily as needed for rash. 04/25/21  Yes [provider]  levothyroxine (SYNTHROID)  150 MCG tablet Take 150 mcg by mouth daily before breakfast.   Yes [provider]  lisinopril (ZESTRIL) 2.5 MG tablet TAKE 1 TABLET BY MOUTH EVERY DAY Patient taking differently: Take 2.5 mg by mouth daily. 11/28/21  Yes Jake Bathe, MD  metFORMIN (GLUCOPHAGE-XR) 500 MG 24 hr tablet Take 1,000 mg by mouth 2 (two) times daily. 08/16/18  Yes [provider]  Multiple Vitamin (MULTIVITAMIN WITH MINERALS) TABS tablet Take 1 tablet by mouth in the morning and at bedtime. Gummies Patient taking differently: Take 2 tablets by mouth in the morning and at bedtime. Gummies 04/02/20  Yes Almon Hercules, MD  Multiple Vitamins-Minerals (PRESERVISION AREDS 2) CAPS Take 1 capsule by mouth daily. 09/06/16  Yes [provider]  nitroGLYCERIN (NITROSTAT) 0.4 MG SL tablet Place 1 tablet (0.4 mg total) under the tongue every 5 (five) minutes x 3 doses as needed for chest pain. 01/20/22  Yes Jake Bathe, MD  Omega 3 1000 MG CAPS Take 1,000 mg by mouth daily.   Yes [provider]  Omeprazole 20  MG TBEC Take 20 mg by mouth daily as needed (acid reflux/ indigestion).  03/07/10  Yes [provider]  Valerian Root 450 MG CAPS Take 450 mg by mouth at bedtime.   Yes [provider]  vitamin B-12 (CYANOCOBALAMIN) 1000 MCG tablet Take 1,000 mcg by mouth 3 (three) times a week. Mon, wed, Friday   Yes [provider]    Inpatient Medications: Scheduled Meds:  aspirin  324 mg Oral NOW   [START ON 08/23/2022] aspirin  81 mg Oral Daily   Continuous Infusions:  sodium chloride     heparin 1,200 Units/hr (08/22/22 2027)   PRN Meds: sodium chloride  Allergies:    Allergies  Allergen Reactions   Other Other (See Comments)   Moxifloxacin Rash    Avelox Rash at injection site   Penicillins Rash     Has patient had a PCN reaction causing immediate rash, facial/tongue/throat swelling, SOB or lightheadedness with hypotension: #  #  #  YES  #  #  #  Has patient had a PCN reaction causing severe rash involving mucus membranes or skin necrosis: No Has patient had a PCN reaction that required hospitalization unknown Has patient had a PCN reaction occurring within the last 10 years: No If all of the above answers are "NO", then may proceed with Cephalosporin use.   Quinolones Itching   Sulfamethoxazole-Trimethoprim Itching, Rash and Other (See Comments)    Social History:   Social History   Socioeconomic History   Marital status: Married    Spouse name: Not on file   Number of children: Not on file   Years of education: Not on file   Highest education level: Not on file  Occupational History   Occupation: IT trainer taxes  Tobacco Use   Smoking status: Never   Smokeless tobacco: Never  Substance and Sexual Activity   Alcohol use: No   Drug use: No   Sexual activity: Yes  Other Topics Concern   Not on file  Social History Narrative   Parents and sister all using CPAP   Social Determinants of Health   Financial Resource Strain: Not on file  Food Insecurity:  Not on file  Transportation Needs: Not on file  Physical Activity: Not on file  Stress: Not on file  Social Connections: Not on file  Intimate Partner Violence: Not on file    Family History:  Family History  Problem Relation Age of Onset   Heart disease Mother      ROS:  Please see the history of present illness.   All other ROS reviewed and negative.     Physical Exam/Data:   Vitals:   08/22/22 1845 08/22/22 1929 08/22/22 2027 08/22/22 2114  BP: 105/65  99/78   Pulse: (!) 52  (!) 49 (!) 45  Resp: 14     Temp:  98.2 F (36.8 C)    TempSrc:  Oral    SpO2: 100%  100% 99%    Intake/Output Summary (Last 24 hours) at 08/22/2022 2351 Last data filed at 08/22/2022 2119 Gross per 24 hour  Intake 658.33 ml  Output --  Net 658.33 ml      08/22/2022    1:42 PM 06/30/2022    8:50 AM 02/07/2022    4:23 PM  Last 3 Weights  Weight (lbs) 223 lb 224 lb 240 lb  Weight (kg) 101.152 kg 101.606 kg 108.863 kg     There is no height or weight on file to calculate BMI.  General:  Well nourished, well developed, in no acute distress HEENT: normal Neck: no JVD Vascular: No carotid bruits; Distal pulses 2+ in left lower extremity, BKA lower extremity Cardiac:  normal S1, S2; bradycardic, no murmur Lungs:  clear to auscultation bilaterally, no wheezing, rhonchi or rales  Abd: soft, nontender, no hepatomegaly  Ext: no edema Musculoskeletal: Right lower extremity BKA with prosthesis in place Skin: warm and dry  Neuro:  CNs 2-12 intact, no focal abnormalities noted Psych:  Normal affect   EKG: Personally reviewed multiple EKGs from 08/22/2022: First EKG with A-fib with RVR with ST depression posteriorly and laterally.  Subsequent EKGs with evidence of both sinus bradycardia and junctional bradycardia, RBBB and LAFB and first-degree AV block (previously seen) with residual ST depressions posteriorly and laterally  Telemetry:  Telemetry was personally reviewed and demonstrates: Sinus  bradycardia in the mid to upper 40s persistently  Relevant CV Studies: TTE 02/02/22 IMPRESSIONS    1. Left ventricular ejection fraction, by estimation, is 60 to 65%. The  left ventricle has normal function. The left ventricle has no regional  wall motion abnormalities. Left ventricular diastolic parameters are  consistent with Grade I diastolic  dysfunction (impaired relaxation). The average left ventricular global  longitudinal strain is -21.3 %. The global longitudinal strain is normal.   2. Right ventricular systolic function is normal. The right ventricular  size is normal. There is normal pulmonary artery systolic pressure.   3. The mitral valve is degenerative. Mild mitral valve regurgitation. No  evidence of mitral stenosis.   4. The aortic valve is normal in structure. There is moderate  calcification of the aortic valve. There is moderate thickening of the  aortic valve. Aortic valve regurgitation is mild. No aortic stenosis is  present. Aortic regurgitation PHT measures 483  msec. Aortic valve area, by VTI measures 2.78 cm. Aortic valve mean  gradient measures 10.0 mmHg. Aortic valve Vmax measures 2.08 m/s.   5. Aortic dilatation noted. There is mild dilatation of the aortic root,  measuring 42 mm. There is mild dilatation of the ascending aorta,  measuring 44 mm.   6. The inferior vena cava is normal in size with greater than 50%  respiratory variability, suggesting right atrial pressure of 3 mmHg.   Laboratory Data:  High Sensitivity Troponin:   Recent Labs  Lab 08/22/22 1557 08/22/22 1751 08/22/22 1913 08/22/22 2055  TROPONINIHS  24* 2,333* 7,113* 11,237*     Chemistry Recent Labs  Lab 08/22/22 1557  NA 138  K 3.6  CL 106  CO2 21*  GLUCOSE 148*  BUN 9  CREATININE 0.76  CALCIUM 8.9  MG 1.8  GFRNONAA >60  ANIONGAP 11    Recent Labs  Lab 08/22/22 1557  PROT 6.7  ALBUMIN 4.1  AST 21  ALT 14  ALKPHOS 49  BILITOT 0.6   Lipids No results for  input(s): "CHOL", "TRIG", "HDL", "LABVLDL", "LDLCALC", "CHOLHDL" in the last 168 hours.  Hematology Recent Labs  Lab 08/22/22 1557  WBC 9.5  RBC 4.85  HGB 14.8  HCT 43.8  MCV 90.3  MCH 30.5  MCHC 33.8  RDW 13.5  PLT 236   Thyroid  Recent Labs  Lab 08/22/22 1557 08/22/22 1704  TSH 0.151*  --   FREET4  --  1.72*    BNP Recent Labs  Lab 08/22/22 1557  BNP 280.8*    DDimer No results for input(s): "DDIMER" in the last 168 hours.   Radiology/Studies:  DG Chest Port 1 View  Result Date: 08/22/2022 CLINICAL DATA:  Atrial fibrillation. EXAM: PORTABLE CHEST 1 VIEW COMPARISON:  X-ray 03/27/2020.  CT angiogram earlier 08/22/2022 FINDINGS: Normal cardiopericardial silhouette. Calcified tortuous aorta. No consolidation, pneumothorax or effusion. No edema. Overlapping cardiac leads. Please see separate dictation of CT of the chest from same day IMPRESSION: No acute cardiopulmonary disease. Electronically Signed   By: Karen Kays M.D.   On: 08/22/2022 16:38   CT ANGIO CHEST AORTA W/CM & OR WO/CM  Result Date: 08/22/2022 CLINICAL DATA:  Thoracic aortic aneurysm. EXAM: CT ANGIOGRAPHY CHEST WITH CONTRAST TECHNIQUE: Multidetector CT imaging of the chest was performed using the standard protocol during bolus administration of intravenous contrast. Multiplanar CT image reconstructions and MIPs were obtained to evaluate the vascular anatomy. RADIATION DOSE REDUCTION: This exam was performed according to the departmental dose-optimization program which includes automated exposure control, adjustment of the mA and/or kV according to patient size and/or use of iterative reconstruction technique. CONTRAST:  75mL ISOVUE-370 IOPAMIDOL (ISOVUE-370) INJECTION 76% COMPARISON:  December 30, 2021. FINDINGS: Cardiovascular: Grossly stable 5 cm ascending thoracic aortic aneurysm is noted. No dissection is noted. Great vessels are widely patent without stenosis. Normal cardiac size. Coronary calcifications are  noted. Mediastinum/Nodes: Postsurgical changes seen involving distal esophagus. No adenopathy is noted. No definite thyroid abnormality is noted. Lungs/Pleura: No pneumothorax or pleural effusion is noted. Minimal bibasilar subsegmental atelectasis or scarring is noted. Stable 5 mm nodule is noted in right middle lobe best seen on image number 105 of series 11. Upper Abdomen: Cholelithiasis.  Status post gastric bypass. Musculoskeletal: No chest wall abnormality. No acute or significant osseous findings. Review of the MIP images confirms the above findings. IMPRESSION: Grossly stable 5 cm Ascending thoracic aortic aneurysm. Recommend semi-annual imaging followup by CTA or MRA and referral to cardiothoracic surgery if not already obtained. This recommendation follows 2010 ACCF/AHA/AATS/ACR/ASA/SCA/SCAI/SIR/STS/SVM Guidelines for the Diagnosis and Management of Patients With Thoracic Aortic Disease. Circulation. 2010; 121: U725-D664. Aortic aneurysm NOS (ICD10-I71.9). Stable 5 mm nodule is noted in right middle lobe. Attention to this on follow-up imaging is recommended. Coronary artery calcifications are noted suggesting coronary artery disease. Cholelithiasis. Status post gastric bypass. Aortic Atherosclerosis (ICD10-I70.0). Electronically Signed   By: Lupita Raider M.D.   On: 08/22/2022 13:52     Assessment and Plan:   NSTEMI, type I versus type II Obstructive coronary artery disease Presenting with very mild  chest discomfort associated with new onset atrial fibrillation with RVR, elevated troponins with delta and new ST depressions.  Occurred in the context of recent GI prep, possibly related to dehydration.  Interestingly, his troponin elevation appears out of proportion to his symptoms and continues to rise despite resolution of atrial fibrillation with RVR.  Has multiple risk factors for NSTEMI and has known obstructive coronary disease last intervened upon in 2006.  Both type I plaque rupture event  and type II event secondary to known obstructive coronary disease and atrial fibrillation with RVR are possible.  Needs ischemic evaluation. - Continue heparin drip (for both A-fib and ACS) - Ordered aspirin 325 now and continue aspirin 81 daily thereafter - No beta-blocker given sinus bradycardia - Hold home ACE inhibitor pending coronary angiogram - N.p.o. at midnight for coronary angiogram in the morning - TTE - Lipid panel  Atrial fibrillation with RVR Sinus bradycardia Junctional bradycardia Right bundle branch block and left anterior fascicular block 1st Degree AV block CHA2DS2-VASc 4.  Denies history of clinical bleeding.  Denies previous atrial fibrillation.  Following conversion of A-fib with RVR with IV metoprolol, displayed both junctional bradycardia and sinus bradycardia.  Sinus bradycardia with first-degree AV block, RBBB and LAFB have been seen on previous EKGs.  Reports no recent history of syncope and denies lightheadedness, dyspnea, chest discomfort while in sinus bradycardia in the 40s. - Anticoagulation as above - Ischemic evaluation as above - Hold beta-blocker or other antiarrhythmic medications - Will have EP see in the morning for consideration of device for tachy-brady and known first-degree AV block/RBBB/LAFB  Ascending aortic aneurysm Stable on recent CT scan - Antihypertensive agents as below  Hypertension Hyperlipidemia Peripheral vascular disease - Hold home lisinopril for now - Continue aspirin 81 mg daily - Continue Lipitor 10 mg daily - Repeat lipid panel, goal LDL <55   Risk Assessment/Risk Scores:     TIMI Risk Score for Unstable Angina or Non-ST Elevation MI:   The patient's TIMI risk score is 6, which indicates a 41% risk of all cause mortality, new or recurrent myocardial infarction or need for urgent revascularization in the next 14 days.    CHA2DS2-VASc Score =   4  This indicates a  4.8% annual risk of stroke. The patient's score is  based upon:           For questions or updates, please contact [facility] Please consult [email or web address] for contact info under    Signed, Aundra Dubin, MD  08/22/2022 11:51 PM

## 2022-08-22 NOTE — Progress Notes (Signed)
[address]       [name] [address]             [phone]     HPI: [name] returns for follow-up of his ascending aneurysm.  [name] is a 72 year old man with a past history of CAD, MI, stents, aortic valve sclerosis, ascending aortic aneurysm, first-degree AV block, trifascicular block, hypertension, hyperlipidemia, type 2 diabetes, morbid obesity, gastric bypass, arthritis, sleep apnea, and hypothyroidism.  Noted to have 4.3 cm aortic root on echocardiogram in 2023.  On follow-up a year later a CT angiogram showed 5 cm aneurysm of the distal ascending aorta and proximal arch.  He had aortic sclerosis and calcification but no significant aortic stenosis.  He has been monitoring his blood pressure on a regular basis.  Has been well-controlled for the most part.  He did have an episode severe fatigue and lightheadedness and a family reunion in [place] few weeks ago.  No chest pain, pressure, or tightness.  Past Medical History:  Diagnosis Date   Anemia    low iron   Arthritis    Coronary artery disease 2006   a. remote stenting to ramus, prox LAD x2.   Dental crowns present    Diabetes mellitus    First degree AV block    GERD (gastroesophageal reflux disease)    Heart attack (HCC) 06/2004   Heart murmur    aortic   Hx MRSA infection 02/2006   Hyperlipidemia    Hypertension    hx. of - has not been on med. since losing wt. after gastric bypass   Hypothyroidism    Morbid obesity (HCC)    Mucoid cyst of joint 09/2011   left thumb   Sleep apnea    sleep study 03/29/2011; no CPAP use, lost >130lbs   Trifascicular block     Current Outpatient Medications  Medication Sig Dispense Refill   amoxicillin (AMOXIL) 500 MG tablet Take 4 tablets by mouth 1 hour prior to dental procedure. 4 tablet 2   aspirin EC 81 MG tablet Take 81 mg by mouth daily.     atorvastatin (LIPITOR) 10 MG tablet Take 10 mg by mouth at bedtime.      augmented betamethasone  dipropionate (DIPROLENE-AF) 0.05 % ointment APPLY SPARINGLY TO AFFECTED AREA TWICE A DAY     Calcium Citrate-Vitamin D (CALCIUM CITRATE CHEWY BITE PO) Take 500 mg by mouth 3 (three) times daily.     ferrous sulfate 325 (65 FE) MG tablet Take 325 mg by mouth daily.      fluticasone (FLONASE) 50 MCG/ACT nasal spray Place 2 sprays into both nostrils daily as needed for allergies.      gabapentin (NEURONTIN) 100 MG capsule TAKE 1 CAPSULE (100 MG TOTAL) BY MOUTH 4 (FOUR) TIMES DAILY. WHEN NECESSARY FOR NEUROPATHY PAIN 120 capsule 3   Hypromellose (GENTEAL SEVERE OP) Place 1 drop into the right eye daily as needed (Dry eyes).     ketoconazole (NIZORAL) 2 % cream Apply 1 Application topically daily as needed for rash.     levothyroxine (SYNTHROID) 150 MCG tablet Take 150 mcg by mouth daily before breakfast.     lisinopril (ZESTRIL) 2.5 MG tablet TAKE 1 TABLET BY MOUTH EVERY DAY 90 tablet 3   metFORMIN (GLUCOPHAGE-XR) 500 MG 24 hr tablet Take 500 mg by mouth 2 (two) times daily.     Multiple Vitamin (MULTIVITAMIN WITH MINERALS) TABS tablet Take 1 tablet by mouth in  the morning and at bedtime. Gummies (Patient taking differently: Take 2 tablets by mouth in the morning and at bedtime. Gummies)     Multiple Vitamins-Minerals (PRESERVISION AREDS 2) CAPS Take 1 capsule by mouth daily.     nitroGLYCERIN (NITROSTAT) 0.4 MG SL tablet Place 1 tablet (0.4 mg total) under the tongue every 5 (five) minutes x 3 doses as needed for chest pain. 25 tablet 3   Omega 3 1000 MG CAPS Take 1,000 mg by mouth daily.     Omeprazole 20 MG TBEC Take 20 mg by mouth daily as needed (acid reflux/ indigestion).      sildenafil (REVATIO) 20 MG tablet Take by mouth.     Valerian Root 450 MG CAPS Take 450 mg by mouth at bedtime.     vitamin B-12 (CYANOCOBALAMIN) 1000 MCG tablet Take 1,000 mcg by mouth 3 (three) times a week. Mon, wed, Friday     No current facility-administered medications for this visit.    Physical Exam BP 99/69    Pulse 89   Resp 18   Ht 6\' 8"  (2.032 m)   Wt 223 lb (101.2 kg)   SpO2 98% Comment: RA  BMI 24.15 kg/m  72 year old man in no acute distress Alert and oriented x 3 with no focal deficits Cardiac tachycardic and irregularly irregular with systolic murmur Carotid arteries with transmitted murmur Lungs clear bilaterally No peripheral edema  Diagnostic Tests: CT ANGIOGRAPHY CHEST WITH CONTRAST   TECHNIQUE: Multidetector CT imaging of the chest was performed using the standard protocol during bolus administration of intravenous contrast. Multiplanar CT image reconstructions and MIPs were obtained to evaluate the vascular anatomy.   RADIATION DOSE REDUCTION: This exam was performed according to the departmental dose-optimization program which includes automated exposure control, adjustment of the mA and/or kV according to patient size and/or use of iterative reconstruction technique.   CONTRAST:  75mL ISOVUE-370 IOPAMIDOL (ISOVUE-370) INJECTION 76%   COMPARISON:  December 30, 2021.   FINDINGS: Cardiovascular: Grossly stable 5 cm ascending thoracic aortic aneurysm is noted. No dissection is noted. Great vessels are widely patent without stenosis. Normal cardiac size. Coronary calcifications are noted.   Mediastinum/Nodes: Postsurgical changes seen involving distal esophagus. No adenopathy is noted. No definite thyroid abnormality is noted.   Lungs/Pleura: No pneumothorax or pleural effusion is noted. Minimal bibasilar subsegmental atelectasis or scarring is noted. Stable 5 mm nodule is noted in right middle lobe best seen on image number 105 of series 11.   Upper Abdomen: Cholelithiasis.  Status post gastric bypass.   Musculoskeletal: No chest wall abnormality. No acute or significant osseous findings.   Review of the MIP images confirms the above findings.   IMPRESSION: Grossly stable 5 cm Ascending thoracic aortic aneurysm. Recommend semi-annual imaging followup by  CTA or MRA and referral to cardiothoracic surgery if not already obtained. This recommendation follows 2010 ACCF/AHA/AATS/ACR/ASA/SCA/SCAI/SIR/STS/SVM Guidelines for the Diagnosis and Management of Patients With Thoracic Aortic Disease. Circulation. 2010; 121: I951-O841. Aortic aneurysm NOS (ICD10-I71.9).   Stable 5 mm nodule is noted in right middle lobe. Attention to this on follow-up imaging is recommended.   Coronary artery calcifications are noted suggesting coronary artery disease.   Cholelithiasis.   Status post gastric bypass.   Aortic Atherosclerosis (ICD10-I70.0).     Electronically Signed   By: Lupita Raider M.D.   On: 08/22/2022 13:52 I personally reviewed the CT images.  No change in the 5 cm distal ascending aortic aneurysm.  Impression: [name] is a 72 year old  man with a past history of CAD, MI, stents, aortic valve sclerosis, ascending aortic aneurysm, first-degree AV block, trifascicular block, hypertension, hyperlipidemia, type 2 diabetes, morbid obesity, gastric bypass, arthritis, sleep apnea, and hypothyroidism.  Ascending aneurysm-stable at 5 cm.  Needs continued semiannual follow-up.  Hypertension-blood pressure well-controlled.  Tachyarrhythmia-heart rate in the 140-150 range.  Irregularly irregular.  Likely atrial fibrillation based on rhythm strip.  Will contact cardiology, but needs to be assessed today.  Plan: Ascending aneurysm-follow-up in 6 months with CT angio of chest Atrial fibrillation- We will contact Dr. Anne Fu office today.  Loreli Slot, MD [facility] [phone]

## 2022-08-22 NOTE — ED Triage Notes (Signed)
Had colonoscopy yesterday, had ct for aneurysm today. When seen started feeling jittery. EKG done showed afib hr 140s-150s. Some lightheadedness and dizziness.

## 2022-08-22 NOTE — ED Provider Notes (Signed)
[place] EMERGENCY DEPARTMENT AT [facility] Provider Note   CSN: [id number] Arrival date & time: 08/22/22  1526     History  Chief Complaint  Patient presents with   Tachycardia    [name] is a 72 y.o. male.  HPI Patient presents for tachycardia.  Medical history includes DM, HLD, CAD, prior gastric bypass surgery, CHF, GERD, anemia aortic aneurysm.  His cardiologist is Dr. Anne Fu.  He states that he had a rapid heart rate 1 other time in the past.  This was approximately a year ago.  He went to [facility], ED where his next heart rate was resolved by the time he was bedded from the waiting room.  He followed up with cardiology and wore to cardiac monitor for 2 weeks.  He did not have any further episodes of abnormal tachycardia.  He did undergo a CT scan during that follow-up.  That was when they identified his aortic aneurysm.  He has since been following up with Dr. Dorris Fetch.  Yesterday, he underwent a colonoscopy.  Today, he underwent a CT scan and had a visit with Dr. Dorris Fetch regarding surveillance of his aneurysm.  He was told that the scan showed no increase in size in the aneurysm.  While he was in Dr. Sunday Corn office, he began to feel lightheaded.  They obtained an EKG which showed concern of atrial fibrillation.  He has no known history of the same.  He is not on anticoagulation.  Currently, he endorses some mild lightheadedness but denies any other symptoms.  He did take his morning medications today.    Home Medications Prior to Admission medications   Medication Sig Start Date End Date Taking? Authorizing Provider  amoxicillin (AMOXIL) 500 MG tablet Take 4 tablets by mouth 1 hour prior to dental procedure. 05/22/22   Tarry Kos, MD  aspirin EC 81 MG tablet Take 81 mg by mouth daily.    [provider]  atorvastatin (LIPITOR) 10 MG tablet Take 10 mg by mouth at bedtime.     [provider]  augmented betamethasone dipropionate  (DIPROLENE-AF) 0.05 % ointment APPLY SPARINGLY TO AFFECTED AREA TWICE A DAY    [provider]  Calcium Citrate-Vitamin D (CALCIUM CITRATE CHEWY BITE PO) Take 500 mg by mouth 3 (three) times daily.    [provider]  ferrous sulfate 325 (65 FE) MG tablet Take 325 mg by mouth daily.     [provider]  fluticasone (FLONASE) 50 MCG/ACT nasal spray Place 2 sprays into both nostrils daily as needed for allergies.     [provider]  gabapentin (NEURONTIN) 100 MG capsule TAKE 1 CAPSULE (100 MG TOTAL) BY MOUTH 4 (FOUR) TIMES DAILY. WHEN NECESSARY FOR NEUROPATHY PAIN 06/30/22   Nadara Mustard, MD  Hypromellose [facility] SEVERE OP) Place 1 drop into the right eye daily as needed (Dry eyes).    [provider]  ketoconazole (NIZORAL) 2 % cream Apply 1 Application topically daily as needed for rash. 04/25/21   [provider]  levothyroxine (SYNTHROID) 150 MCG tablet Take 150 mcg by mouth daily before breakfast.    [provider]  lisinopril (ZESTRIL) 2.5 MG tablet TAKE 1 TABLET BY MOUTH EVERY DAY 11/28/21   Jake Bathe, MD  metFORMIN (GLUCOPHAGE-XR) 500 MG 24 hr tablet Take 500 mg by mouth 2 (two) times daily. 08/16/18   [provider]  Multiple Vitamin (MULTIVITAMIN WITH MINERALS) TABS tablet Take 1 tablet by mouth in  the morning and at bedtime. Gummies Patient taking differently: Take 2 tablets by mouth in the morning and at bedtime. Gummies 04/02/20   Almon Hercules, MD  Multiple Vitamins-Minerals (PRESERVISION AREDS 2) CAPS Take 1 capsule by mouth daily. 09/06/16   [provider]  nitroGLYCERIN (NITROSTAT) 0.4 MG SL tablet Place 1 tablet (0.4 mg total) under the tongue every 5 (five) minutes x 3 doses as needed for chest pain. 01/20/22   Jake Bathe, MD  Omega 3 1000 MG CAPS Take 1,000 mg by mouth daily.    [provider]  Omeprazole 20 MG TBEC Take 20 mg by mouth daily as needed (acid reflux/ indigestion).   03/07/10   [provider]  sildenafil (REVATIO) 20 MG tablet Take by mouth. 10/05/14   [provider]  Valerian Root 450 MG CAPS Take 450 mg by mouth at bedtime.    [provider]  vitamin B-12 (CYANOCOBALAMIN) 1000 MCG tablet Take 1,000 mcg by mouth 3 (three) times a week. Mon, wed, Friday    [provider]      Allergies    Other, Moxifloxacin, Penicillins, Quinolones, and Sulfamethoxazole-trimethoprim    Review of Systems   Review of Systems  Neurological:  Positive for light-headedness.  All other systems reviewed and are negative.   Physical Exam Updated Vital Signs BP 104/63   Pulse (!) 50   Temp 97.8 F (36.6 C) (Oral)   Resp 12   SpO2 98%  Physical Exam Vitals and nursing note reviewed.  Constitutional:      General: He is not in acute distress.    Appearance: Normal appearance. He is well-developed. He is not ill-appearing, toxic-appearing or diaphoretic.  HENT:     Head: Normocephalic and atraumatic.     Right Ear: External ear normal.     Left Ear: External ear normal.     Nose: Nose normal.     Mouth/Throat:     Mouth: Mucous membranes are moist.  Eyes:     Extraocular Movements: Extraocular movements intact.     Conjunctiva/sclera: Conjunctivae normal.  Cardiovascular:     Rate and Rhythm: Tachycardia present. Rhythm irregular.     Heart sounds: Murmur heard.  Pulmonary:     Effort: Pulmonary effort is normal. No respiratory distress.  Abdominal:     General: There is no distension.     Palpations: Abdomen is soft.  Musculoskeletal:        General: Normal range of motion.     Cervical back: Normal range of motion and neck supple.  Skin:    General: Skin is warm and dry.     Coloration: Skin is not jaundiced or pale.  Neurological:     General: No focal deficit present.     Mental Status: He is alert and oriented to person, place, and time.  Psychiatric:        Mood and Affect: Mood normal.        Behavior:  Behavior normal.     ED Results / Procedures / Treatments   Labs (all labs ordered are listed, but only abnormal results are displayed) Labs Reviewed  BASIC METABOLIC PANEL - Abnormal; Notable for the following components:      Result Value   CO2 21 (*)    Glucose, Bld 148 (*)    All other components within normal limits  TSH - Abnormal; Notable for the following components:   TSH 0.151 (*)    All other components within  normal limits  BRAIN NATRIURETIC PEPTIDE - Abnormal; Notable for the following components:   B Natriuretic Peptide 280.8 (*)    All other components within normal limits  TROPONIN I (HIGH SENSITIVITY) - Abnormal; Notable for the following components:   Troponin I (High Sensitivity) 24 (*)    All other components within normal limits  MAGNESIUM  CBC  HEPATIC FUNCTION PANEL  T3, FREE  T4, FREE  TROPONIN I (HIGH SENSITIVITY)    EKG EKG Interpretation Date/Time:  Tuesday August 22 2022 16:06:06 EDT Ventricular Rate:  75 PR Interval:  97 QRS Duration:  144 QT Interval:  446 QTC Calculation: 419 R Axis:   -64  Text Interpretation: Junctional rhythm in a pattern of bigeminy RBBB and LAFB Left ventricular hypertrophy Confirmed by Gloris Manchester [phone]) on 08/22/2022 5:06:04 PM  Radiology DG Chest Port 1 View  Result Date: 08/22/2022 CLINICAL DATA:  Atrial fibrillation. EXAM: PORTABLE CHEST 1 VIEW COMPARISON:  X-ray 03/27/2020.  CT angiogram earlier 08/22/2022 FINDINGS: Normal cardiopericardial silhouette. Calcified tortuous aorta. No consolidation, pneumothorax or effusion. No edema. Overlapping cardiac leads. Please see separate dictation of CT of the chest from same day IMPRESSION: No acute cardiopulmonary disease. Electronically Signed   By: Karen Kays M.D.   On: 08/22/2022 16:38   CT ANGIO CHEST AORTA W/CM & OR WO/CM  Result Date: 08/22/2022 CLINICAL DATA:  Thoracic aortic aneurysm. EXAM: CT ANGIOGRAPHY CHEST WITH CONTRAST TECHNIQUE: Multidetector CT imaging of  the chest was performed using the standard protocol during bolus administration of intravenous contrast. Multiplanar CT image reconstructions and MIPs were obtained to evaluate the vascular anatomy. RADIATION DOSE REDUCTION: This exam was performed according to the departmental dose-optimization program which includes automated exposure control, adjustment of the mA and/or kV according to patient size and/or use of iterative reconstruction technique. CONTRAST:  75mL ISOVUE-370 IOPAMIDOL (ISOVUE-370) INJECTION 76% COMPARISON:  December 30, 2021. FINDINGS: Cardiovascular: Grossly stable 5 cm ascending thoracic aortic aneurysm is noted. No dissection is noted. Great vessels are widely patent without stenosis. Normal cardiac size. Coronary calcifications are noted. Mediastinum/Nodes: Postsurgical changes seen involving distal esophagus. No adenopathy is noted. No definite thyroid abnormality is noted. Lungs/Pleura: No pneumothorax or pleural effusion is noted. Minimal bibasilar subsegmental atelectasis or scarring is noted. Stable 5 mm nodule is noted in right middle lobe best seen on image number 105 of series 11. Upper Abdomen: Cholelithiasis.  Status post gastric bypass. Musculoskeletal: No chest wall abnormality. No acute or significant osseous findings. Review of the MIP images confirms the above findings. IMPRESSION: Grossly stable 5 cm Ascending thoracic aortic aneurysm. Recommend semi-annual imaging followup by CTA or MRA and referral to cardiothoracic surgery if not already obtained. This recommendation follows 2010 ACCF/AHA/AATS/ACR/ASA/SCA/SCAI/SIR/STS/SVM Guidelines for the Diagnosis and Management of Patients With Thoracic Aortic Disease. Circulation. 2010; 121: V784-O962. Aortic aneurysm NOS (ICD10-I71.9). Stable 5 mm nodule is noted in right middle lobe. Attention to this on follow-up imaging is recommended. Coronary artery calcifications are noted suggesting coronary artery disease. Cholelithiasis. Status  post gastric bypass. Aortic Atherosclerosis (ICD10-I70.0). Electronically Signed   By: Lupita Raider M.D.   On: 08/22/2022 13:52    Procedures Procedures    Medications Ordered in ED Medications  potassium chloride SA (KLOR-CON M) CR tablet 40 mEq (has no administration in time range)  magnesium sulfate IVPB 1 g 100 mL (has no administration in time range)  lactated ringers bolus 500 mL (0 mLs Intravenous Stopped 08/22/22 1709)    ED Course/ Medical Decision Making/ A&P  Medical Decision Making Amount and/or Complexity of Data Reviewed Labs: ordered. Radiology: ordered.  Risk Prescription drug management. Decision regarding hospitalization.   This patient presents to the ED for concern of tachycardia, this involves an extensive number of treatment options, and is a complaint that carries with it a high risk of complications and morbidity.  The differential diagnosis includes atrial fibrillation, sinus tachycardia, ventricular tachycardia, other arrhythmia   Co morbidities that complicate the patient evaluation  DM, HLD, CAD, prior gastric bypass surgery, CHF, GERD, anemia aortic aneurysm   Additional history obtained:  Additional history obtained from N/A External records from outside source obtained and reviewed including EMR   Lab Tests:  I Ordered, and personally interpreted labs.  The pertinent results include: Normal hemoglobin, no leukocytosis, low-normal potassium and magnesium.  Normal kidney function.  Slight elevation in troponin and BNP   Imaging Studies ordered:  I ordered imaging studies including chest x-ray I independently visualized and interpreted imaging which showed no acute findings I agree with the radiologist interpretation   Cardiac Monitoring: / EKG:  The patient was maintained on a cardiac monitor.  I personally viewed and interpreted the cardiac monitored which showed an underlying rhythm of: Initially  atrial fibrillation with RVR, subsequently sinus bradycardia with intermittent episodes of junctional bradycardia.   Consultations Obtained:  I requested consultation with the cardiologist, Dr. Jacques Navy,  and discussed lab and imaging findings as well as pertinent plan - they recommend: Admission to medicine at [facility].  Cardiology to follow in consult.   Problem List / ED Course / Critical interventions / Medication management  Patient presents for tachycardia.  Other than some lightheadedness, he has otherwise been asymptomatic.  On arrival, patient has elevated heart rate in the range of 150s.  It is irregularly irregular.  It is wide-complex but he does have history of bifascicular block.  Blood pressures are on the low side.  He did take his blood pressure medications this morning.  Metoprolol was ordered for rate control.  Workup was initiated.  Following initial 5 mg dose of metoprolol, patient had conversion to sinus rhythm/junctional rhythm.  He remained in sinus rhythm for most of the time in the ED.  Heart rate remained low in the range of 50.  Blood pressures remain soft, but within normal limits.  He remained lightheaded.  I spoke with cardiologist on-call, Dr. Jacques Navy, who recommends medicine admission.  Patient remained hemodynamically stable.  Lab work was unremarkable.  Magnesium potassium supplements were ordered for electrolyte optimization.  Patient was admitted for further management. I ordered medication including IVF for hydration; metoprolol for rate control; magnesium sulfate and potassium chloride for electrolyte optimization Reevaluation of the patient after these medicines showed that the patient improved I have reviewed the patients home medicines and have made adjustments as needed   Social Determinants of Health:  Has access to outpatient care  CRITICAL CARE Performed by: Gloris Manchester   Total critical care time: 32 minutes  Critical care time was exclusive of  separately billable procedures and treating other patients.  Critical care was necessary to treat or prevent imminent or life-threatening deterioration.  Critical care was time spent personally by me on the following activities: development of treatment plan with patient and/or surrogate as well as nursing, discussions with consultants, evaluation of patient's response to treatment, examination of patient, obtaining history from patient or surrogate, ordering and performing treatments and interventions, ordering and review of laboratory studies, ordering and review of radiographic studies, pulse  oximetry and re-evaluation of patient's condition.         Final Clinical Impression(s) / ED Diagnoses Final diagnoses:  Atrial fibrillation with RVR [facility])    Rx / DC Orders ED Discharge Orders          Ordered    Amb referral to AFIB Clinic        08/22/22 1553              Gloris Manchester, MD 08/22/22 1724

## 2022-08-23 ENCOUNTER — Other Ambulatory Visit (HOSPITAL_COMMUNITY): Payer: Self-pay

## 2022-08-23 ENCOUNTER — Other Ambulatory Visit: Payer: Self-pay

## 2022-08-23 ENCOUNTER — Encounter (HOSPITAL_COMMUNITY)
Admission: EM | Disposition: A | Discharge status: Home / Self Care | Payer: Self-pay | Source: Home / Self Care | Attending: Internal Medicine

## 2022-08-23 ENCOUNTER — Observation Stay (HOSPITAL_COMMUNITY): Payer: Medicare Other

## 2022-08-23 ENCOUNTER — Encounter (HOSPITAL_COMMUNITY): Payer: Self-pay | Admitting: Family Medicine

## 2022-08-23 DIAGNOSIS — Z79899 Other long term (current) drug therapy: Secondary | ICD-10-CM | POA: Diagnosis not present

## 2022-08-23 DIAGNOSIS — I7121 Aneurysm of the ascending aorta, without rupture: Secondary | ICD-10-CM | POA: Diagnosis not present

## 2022-08-23 DIAGNOSIS — Z9884 Bariatric surgery status: Secondary | ICD-10-CM | POA: Diagnosis not present

## 2022-08-23 DIAGNOSIS — R001 Bradycardia, unspecified: Secondary | ICD-10-CM | POA: Diagnosis not present

## 2022-08-23 DIAGNOSIS — D125 Benign neoplasm of sigmoid colon: Secondary | ICD-10-CM | POA: Diagnosis not present

## 2022-08-23 DIAGNOSIS — E1151 Type 2 diabetes mellitus with diabetic peripheral angiopathy without gangrene: Secondary | ICD-10-CM | POA: Diagnosis present

## 2022-08-23 DIAGNOSIS — Z7984 Long term (current) use of oral hypoglycemic drugs: Secondary | ICD-10-CM | POA: Diagnosis not present

## 2022-08-23 DIAGNOSIS — I480 Paroxysmal atrial fibrillation: Secondary | ICD-10-CM | POA: Diagnosis present

## 2022-08-23 DIAGNOSIS — Z8614 Personal history of Methicillin resistant Staphylococcus aureus infection: Secondary | ICD-10-CM | POA: Diagnosis not present

## 2022-08-23 DIAGNOSIS — D120 Benign neoplasm of cecum: Secondary | ICD-10-CM | POA: Diagnosis not present

## 2022-08-23 DIAGNOSIS — Z881 Allergy status to other antibiotic agents status: Secondary | ICD-10-CM | POA: Diagnosis not present

## 2022-08-23 DIAGNOSIS — I252 Old myocardial infarction: Secondary | ICD-10-CM | POA: Diagnosis not present

## 2022-08-23 DIAGNOSIS — Z79890 Hormone replacement therapy (postmenopausal): Secondary | ICD-10-CM | POA: Diagnosis not present

## 2022-08-23 DIAGNOSIS — I739 Peripheral vascular disease, unspecified: Secondary | ICD-10-CM | POA: Diagnosis not present

## 2022-08-23 DIAGNOSIS — E785 Hyperlipidemia, unspecified: Secondary | ICD-10-CM | POA: Diagnosis present

## 2022-08-23 DIAGNOSIS — I214 Non-ST elevation (NSTEMI) myocardial infarction: Secondary | ICD-10-CM | POA: Diagnosis not present

## 2022-08-23 DIAGNOSIS — I2510 Atherosclerotic heart disease of native coronary artery without angina pectoris: Secondary | ICD-10-CM | POA: Diagnosis not present

## 2022-08-23 DIAGNOSIS — Z8249 Family history of ischemic heart disease and other diseases of the circulatory system: Secondary | ICD-10-CM | POA: Diagnosis not present

## 2022-08-23 DIAGNOSIS — E1159 Type 2 diabetes mellitus with other circulatory complications: Secondary | ICD-10-CM | POA: Diagnosis not present

## 2022-08-23 DIAGNOSIS — E11610 Type 2 diabetes mellitus with diabetic neuropathic arthropathy: Secondary | ICD-10-CM | POA: Diagnosis present

## 2022-08-23 DIAGNOSIS — I495 Sick sinus syndrome: Secondary | ICD-10-CM | POA: Diagnosis present

## 2022-08-23 DIAGNOSIS — I110 Hypertensive heart disease with heart failure: Secondary | ICD-10-CM | POA: Diagnosis present

## 2022-08-23 DIAGNOSIS — I4891 Unspecified atrial fibrillation: Secondary | ICD-10-CM | POA: Diagnosis not present

## 2022-08-23 DIAGNOSIS — I440 Atrioventricular block, first degree: Secondary | ICD-10-CM | POA: Diagnosis present

## 2022-08-23 DIAGNOSIS — E876 Hypokalemia: Secondary | ICD-10-CM | POA: Diagnosis present

## 2022-08-23 DIAGNOSIS — Z89511 Acquired absence of right leg below knee: Secondary | ICD-10-CM | POA: Diagnosis not present

## 2022-08-23 DIAGNOSIS — Z96641 Presence of right artificial hip joint: Secondary | ICD-10-CM | POA: Diagnosis present

## 2022-08-23 DIAGNOSIS — K219 Gastro-esophageal reflux disease without esophagitis: Secondary | ICD-10-CM | POA: Diagnosis present

## 2022-08-23 DIAGNOSIS — E039 Hypothyroidism, unspecified: Secondary | ICD-10-CM | POA: Diagnosis present

## 2022-08-23 DIAGNOSIS — I453 Trifascicular block: Secondary | ICD-10-CM | POA: Diagnosis present

## 2022-08-23 HISTORY — PX: CORONARY STENT INTERVENTION: CATH118234

## 2022-08-23 HISTORY — PX: LEFT HEART CATH AND CORONARY ANGIOGRAPHY: CATH118249

## 2022-08-23 LAB — BASIC METABOLIC PANEL
Anion gap: 9 (ref 5–15)
BUN: 7 mg/dL — ABNORMAL LOW (ref 8–23)
CO2: 26 mmol/L (ref 22–32)
Calcium: 8.6 mg/dL — ABNORMAL LOW (ref 8.9–10.3)
Chloride: 103 mmol/L (ref 98–111)
Creatinine, Ser: 0.86 mg/dL (ref 0.61–1.24)
GFR, Estimated: >60 mL/min (ref >60)
Glucose, Bld: 135 mg/dL — ABNORMAL HIGH (ref 70–99)
Potassium: 3.9 mmol/L (ref 3.5–5.1)
Sodium: 138 mmol/L (ref 135–145)

## 2022-08-23 LAB — HEPATIC FUNCTION PANEL
ALT: 20 U/L (ref 0–44)
AST: 44 U/L — ABNORMAL HIGH (ref 15–41)
Albumin: 3.4 g/dL — ABNORMAL LOW (ref 3.5–5.0)
Alkaline Phosphatase: 49 U/L (ref 38–126)
Bilirubin, Direct: 0.2 mg/dL (ref 0.0–0.2)
Indirect Bilirubin: 0.8 mg/dL (ref 0.3–0.9)
Total Bilirubin: 1 mg/dL (ref 0.3–1.2)
Total Protein: 6.3 g/dL — ABNORMAL LOW (ref 6.5–8.1)

## 2022-08-23 LAB — HEPARIN LEVEL (UNFRACTIONATED): Heparin Unfractionated: 0.22 IU/mL — ABNORMAL LOW (ref 0.30–0.70)

## 2022-08-23 LAB — POCT ACTIVATED CLOTTING TIME
Activated Clotting Time: 378 seconds
Activated Clotting Time: 360 seconds

## 2022-08-23 LAB — GLUCOSE, CAPILLARY: Glucose-Capillary: 107 mg/dL — ABNORMAL HIGH (ref 70–99)

## 2022-08-23 SURGERY — LEFT HEART CATH AND CORONARY ANGIOGRAPHY
Anesthesia: LOCAL

## 2022-08-23 MED ORDER — VERAPAMIL HCL 2.5 MG/ML IV SOLN
INTRAVENOUS | Status: AC
  Filled 2022-08-23: qty 2

## 2022-08-23 MED ORDER — VERAPAMIL HCL 2.5 MG/ML IV SOLN
INTRAVENOUS | Status: DC | PRN
  Administered 2022-08-23: 10 mL via INTRA_ARTERIAL

## 2022-08-23 MED ORDER — LEVOTHYROXINE SODIUM 25 MCG PO TABS
125.0000 ug | ORAL_TABLET | Freq: Every day | ORAL | Status: DC
  Administered 2022-08-23 – 2022-08-24 (×2): 125 ug via ORAL
  Filled 2022-08-23 (×3): qty 1

## 2022-08-23 MED ORDER — CLOPIDOGREL BISULFATE 75 MG PO TABS
75.0000 mg | ORAL_TABLET | Freq: Every day | ORAL | Status: DC
  Administered 2022-08-24: 75 mg via ORAL
  Filled 2022-08-23: qty 1

## 2022-08-23 MED ORDER — ATORVASTATIN CALCIUM 10 MG PO TABS: 10.0000 mg | ORAL_TABLET | Freq: Every day | ORAL | Status: DC

## 2022-08-23 MED ORDER — SODIUM CHLORIDE 0.9 % IV SOLN: 250.0000 mL | INTRAVENOUS | Status: DC | PRN

## 2022-08-23 MED ORDER — FENTANYL CITRATE (PF) 100 MCG/2ML IJ SOLN
INTRAMUSCULAR | Status: DC | PRN
  Administered 2022-08-23: 25 ug via INTRAVENOUS

## 2022-08-23 MED ORDER — FAMOTIDINE IN NACL 20-0.9 MG/50ML-% IV SOLN
INTRAVENOUS | Status: AC
  Filled 2022-08-23: qty 50

## 2022-08-23 MED ORDER — ASPIRIN 81 MG PO CHEW: 81.0000 mg | CHEWABLE_TABLET | ORAL | Status: DC

## 2022-08-23 MED ORDER — SODIUM CHLORIDE 0.9% FLUSH: 3.0000 mL | INTRAVENOUS | Status: DC | PRN

## 2022-08-23 MED ORDER — VITAMIN B-12 1000 MCG PO TABS
1000.0000 ug | ORAL_TABLET | ORAL | Status: DC
  Administered 2022-08-23: 1000 ug via ORAL
  Filled 2022-08-23: qty 1

## 2022-08-23 MED ORDER — CLOPIDOGREL BISULFATE 300 MG PO TABS
ORAL_TABLET | ORAL | Status: AC
  Filled 2022-08-23: qty 2

## 2022-08-23 MED ORDER — GABAPENTIN 100 MG PO CAPS: 100.0000 mg | ORAL_CAPSULE | Freq: Four times a day (QID) | ORAL | Status: DC | PRN

## 2022-08-23 MED ORDER — FENTANYL CITRATE (PF) 100 MCG/2ML IJ SOLN
INTRAMUSCULAR | Status: AC
  Filled 2022-08-23: qty 2

## 2022-08-23 MED ORDER — SODIUM CHLORIDE 0.9 % WEIGHT BASED INFUSION
3.0000 mL/kg/h | INTRAVENOUS | Status: DC
  Administered 2022-08-23: 3 mL/kg/h via INTRAVENOUS

## 2022-08-23 MED ORDER — LIDOCAINE HCL (PF) 1 % IJ SOLN
INTRAMUSCULAR | Status: DC | PRN
  Administered 2022-08-23: 2 mL

## 2022-08-23 MED ORDER — FAMOTIDINE IN NACL 20-0.9 MG/50ML-% IV SOLN
INTRAVENOUS | Status: AC | PRN
  Administered 2022-08-23: 20 mg via INTRAVENOUS

## 2022-08-23 MED ORDER — MIDAZOLAM HCL 2 MG/2ML IJ SOLN
INTRAMUSCULAR | Status: DC | PRN
  Administered 2022-08-23: 1 mg via INTRAVENOUS

## 2022-08-23 MED ORDER — OMEGA-3-ACID ETHYL ESTERS 1 G PO CAPS
1000.0000 mg | ORAL_CAPSULE | Freq: Every day | ORAL | Status: DC
  Administered 2022-08-23 – 2022-08-24 (×2): 1000 mg via ORAL
  Filled 2022-08-23 (×2): qty 1

## 2022-08-23 MED ORDER — IOHEXOL 350 MG/ML SOLN
INTRAVENOUS | Status: DC | PRN
  Administered 2022-08-23: 160 mL

## 2022-08-23 MED ORDER — ATORVASTATIN CALCIUM 40 MG PO TABS
40.0000 mg | ORAL_TABLET | Freq: Every day | ORAL | Status: DC
  Administered 2022-08-23: 40 mg via ORAL
  Filled 2022-08-23: qty 1

## 2022-08-23 MED ORDER — CLOPIDOGREL BISULFATE 300 MG PO TABS
ORAL_TABLET | ORAL | Status: DC | PRN
  Administered 2022-08-23: 600 mg via ORAL

## 2022-08-23 MED ORDER — ONDANSETRON HCL 4 MG/2ML IJ SOLN: 4.0000 mg | Freq: Four times a day (QID) | INTRAMUSCULAR | Status: DC | PRN

## 2022-08-23 MED ORDER — FERROUS SULFATE 325 (65 FE) MG PO TABS
325.0000 mg | ORAL_TABLET | ORAL | Status: DC
  Administered 2022-08-23: 325 mg via ORAL
  Filled 2022-08-23: qty 1

## 2022-08-23 MED ORDER — HEPARIN SODIUM (PORCINE) 1000 UNIT/ML IJ SOLN
INTRAMUSCULAR | Status: DC | PRN
  Administered 2022-08-23 (×2): 5000 [IU] via INTRAVENOUS

## 2022-08-23 MED ORDER — APIXABAN 5 MG PO TABS: 5.0000 mg | ORAL_TABLET | Freq: Two times a day (BID) | ORAL | Status: DC

## 2022-08-23 MED ORDER — SODIUM CHLORIDE 0.9 % WEIGHT BASED INFUSION
1.0000 mL/kg/h | INTRAVENOUS | Status: AC
  Administered 2022-08-23: 1 mL/kg/h via INTRAVENOUS

## 2022-08-23 MED ORDER — ACETAMINOPHEN 325 MG PO TABS
650.0000 mg | ORAL_TABLET | ORAL | Status: DC | PRN
  Administered 2022-08-24: 650 mg via ORAL
  Filled 2022-08-23: qty 2

## 2022-08-23 MED ORDER — SODIUM CHLORIDE 0.9 % WEIGHT BASED INFUSION: 1.0000 mL/kg/h | INTRAVENOUS | Status: DC

## 2022-08-23 MED ORDER — HEPARIN SODIUM (PORCINE) 1000 UNIT/ML IJ SOLN
INTRAMUSCULAR | Status: AC
  Filled 2022-08-23: qty 10

## 2022-08-23 MED ORDER — SODIUM CHLORIDE 0.9% FLUSH: 3.0000 mL | Freq: Two times a day (BID) | INTRAVENOUS | Status: DC

## 2022-08-23 MED ORDER — HEPARIN (PORCINE) IN NACL 1000-0.9 UT/500ML-% IV SOLN
INTRAVENOUS | Status: DC | PRN
  Administered 2022-08-23 (×2): 500 mL

## 2022-08-23 MED ORDER — MIDAZOLAM HCL 2 MG/2ML IJ SOLN
INTRAMUSCULAR | Status: AC
  Filled 2022-08-23: qty 2

## 2022-08-23 MED ORDER — HEPARIN BOLUS VIA INFUSION
2000.0000 [IU] | Freq: Once | INTRAVENOUS | Status: AC
  Administered 2022-08-23: 2000 [IU] via INTRAVENOUS
  Filled 2022-08-23: qty 2000

## 2022-08-23 MED ORDER — ASPIRIN 81 MG PO CHEW
81.0000 mg | CHEWABLE_TABLET | Freq: Every day | ORAL | Status: DC
  Administered 2022-08-24: 81 mg via ORAL
  Filled 2022-08-23: qty 1

## 2022-08-23 MED ORDER — LIDOCAINE HCL (PF) 1 % IJ SOLN
INTRAMUSCULAR | Status: AC
  Filled 2022-08-23: qty 30

## 2022-08-23 MED ORDER — NITROGLYCERIN 1 MG/10 ML FOR IR/CATH LAB
INTRA_ARTERIAL | Status: AC
  Filled 2022-08-23: qty 10

## 2022-08-23 SURGICAL SUPPLY — 27 items
BALLN EMERGE MR 2.5X12 (BALLOONS) ×1
BALLN ~~LOC~~ EMERGE MR 4.0X8 (BALLOONS) ×1
BALLOON EMERGE MR 2.5X12 (BALLOONS) IMPLANT
BALLOON ~~LOC~~ EMERGE MR 4.0X8 (BALLOONS) IMPLANT
CATH GUIDELINER COAST (CATHETERS) IMPLANT
CATH INFINITI 5FR MULTPACK ANG (CATHETERS) IMPLANT
CATH LAUNCHER 6FR AL1 (CATHETERS) IMPLANT
CATH VISTA GUIDE 6FR JR4 (CATHETERS) IMPLANT
CATH VISTA GUIDE 6FR XBLAD3.5 (CATHETERS) IMPLANT
CATHETER LAUNCHER 6FR AL1 (CATHETERS) ×1
CATHETER LAUNCHER 6FR RCB (CATHETERS) IMPLANT
DEVICE RAD COMP TR BAND LRG (VASCULAR PRODUCTS) IMPLANT
ELECT DEFIB PAD ADLT CADENCE (PAD) IMPLANT
GLIDESHEATH SLEND SS 6F .021 (SHEATH) IMPLANT
GUIDEWIRE INQWIRE 1.5J.035X260 (WIRE) IMPLANT
INQWIRE 1.5J .035X260CM (WIRE) ×2
KIT ENCORE 26 ADVANTAGE (KITS) IMPLANT
KIT HEART LEFT (KITS) ×1 IMPLANT
KIT SINGLE USE MANIFOLD (KITS) IMPLANT
PACK CARDIAC CATHETERIZATION (CUSTOM PROCEDURE TRAY) ×1 IMPLANT
SET ATX-X65L (MISCELLANEOUS) IMPLANT
STENT SYNERGY XD 3.50X12 (Permanent Stent) IMPLANT
SYNERGY XD 3.50X12 (Permanent Stent) ×1 IMPLANT
TUBING CIL FLEX 10 FLL-RA (TUBING) IMPLANT
WIRE ASAHI FIELDER XT 190CM (WIRE) IMPLANT
WIRE ASAHI PROWATER 180CM (WIRE) IMPLANT
WIRE HI TORQ WHISPER MS 190CM (WIRE) IMPLANT

## 2022-08-23 NOTE — Assessment & Plan Note (Signed)
-  appears to been over-corrected with low TSH and high T4 -will decrease dose from to with follow up labs with primary in 6-8 weeks

## 2022-08-23 NOTE — Plan of Care (Signed)

## 2022-08-23 NOTE — Assessment & Plan Note (Signed)
-  pt continued to have increasing troponin despite resolution of his atrial fibrillation. Troponin 2333 --[phone] -he has known obstructive coronary artery disease with multiple stents  -cardiology pends for coronary angiogram in the morning. Hold ACE inhibitor.  -obtain TTE -continue IV heparin infusion -daily aspirin  -obtain lipid panel  -holding beta-blocker due to bradycardia

## 2022-08-23 NOTE — Assessment & Plan Note (Signed)
-  Follows with CT surgery. Recent CT showing stable aneurysm at 5cm

## 2022-08-23 NOTE — H&P (View-Only) (Signed)
Confirmed with Dr. Excell Seltzer no plan to pre-medicate. Do not suspect presenting symptoms were allergic rxn mediated.

## 2022-08-23 NOTE — Progress Notes (Addendum)
Confirmed with Dr. Excell Seltzer no plan to pre-medicate. Do not suspect presenting symptoms were allergic rxn mediated. Also relayed consult request, non urgent, to EP APP.

## 2022-08-23 NOTE — Progress Notes (Signed)
PROGRESS NOTE    [name]  GNF:[id number] DOB: September 12, 1950 DOA: 08/22/2022 PCP: Thana Ates, MD    Brief Narrative:  [name] is a 72 y.o. male with medical history significant of CAD s/p stents, aortic valve sclerosis, AAA, first degree AV block, trifascicular block, HTN, HLD T2DM, morbid obesity, hypothyroidism was seen outpatient by CT surgery for routine follow up surveillance of his aortic aneurysm. He reported feeling of dizziness and chest discomfort earlier in the day and was noted to have tachyarrhythmia in the 140-150 likely atrial fibrillation and sent to ED. He had colonoscopy yesterday with 2 days of prep and had been feeling more fatigue. He also mention episode of palpitation about a year ago with 2 weeks of cardiac monitoring without much findings.  In the ED, he was in atrial fibrillation with RVR with soft blood pressure. He was given 5mg  dose of metoprolol with conversion to sinus rhythm/junctional rhythm. Cardiology  was consulted by ED physician and was admitted to the hospital for further evaluation and treatment.  Assessment and Plan: * Paroxysmal atrial fibrillation with RVR (HCC) Presented with HR 150 and received 5mg  of metoprolol in the ED with subsequent conversion to sinus rhythm and junctional rhythm. Subsequently had sinus bradycardia in 40s. CHA2DS2-VASc 4- continue IV heparin. Cardiology has advised holding any further beta-blocker or antiarrhythmic medication. EP to consult  for consideration of device for trifascicular block   NSTEMI (non-ST elevated myocardial infarction) (HCC)  Troponin 2333 -->8469->62952.  Known history of CAD with multiple stents.  Cardiology on board.  Continue aspirin and heparin drip, holding beta-blocker due to bradycardia.  Follow cardiology recommendation.  No chest pain.  Patient will likely undergo cardiac catheterization followed by EP cardiology consultation.   Ascending aortic aneurysm (HCC) -Follows with CT surgery. Recent  CT showing stable aneurysm at 5cm  Peripheral vascular disease (HCC) -continue aspirin and statin   Trifascicular block -hx of first degree AV block, RBBB and LAFB. EP for consideration of pacemaker   Type 2 diabetes mellitus with vascular disease (HCC) Latest HbA1C 6.7.  Continue sliding scale insulin, Accu-Cheks, diabetic diet.   Hypothyroidism low TSH and high T4 Dose of synthorid decreased from to with follow up labs with primary in 6-8 weeks      DVT prophylaxis: Heparin drip   Code Status:     Code Status: Full Code  Disposition: This Status is: Observation  The patient will require care spanning > 2 midnights and should be moved to inpatient because: Non-ST elevation MI, heparin drip, possible need for cardiac catheterization.  Pacemaker.   Family Communication: None at bedside  Consultants:  Cardiology  Procedures:  None yet  Antimicrobials:  None  Anti-infectives (From admission, onward)    None      Subjective: Today, patient was seen and examined at bedside.  Patient denies any chest pain, dizziness, lightheadedness, shortness of breath.  Objective: Vitals:   08/22/22 2317 08/23/22 0037 08/23/22 0506 08/23/22 0800  BP: 107/72 107/72 114/67 (!) 129/58  Pulse:  (!) 45  (!) 43  Resp: 15 15 15 18   Temp: 97.7 F (36.5 C) 97.7 F (36.5 C) 97.7 F (36.5 C) 97.8 F (36.6 C)  TempSrc: Oral Oral Oral Oral  SpO2:    99%  Weight:  101.1 kg 101.2 kg   Height:  6\' 8"  (2.032 m)      Intake/Output Summary (Last 24 hours) at 08/23/2022 1016 Last data filed at 08/22/2022 2119 Gross per  24 hour  Intake 658.33 ml  Output --  Net 658.33 ml   Filed Weights   08/23/22 0037 08/23/22 0506  Weight: 101.1 kg 101.2 kg    Physical Examination: Body mass index is 24.51 kg/m.  General:  Average built, not in obvious distress HENT:   No scleral pallor or icterus noted. Oral mucosa is moist.  Chest:  Clear breath sounds.  . No crackles or  wheezes.  CVS: S1 &S2 heard.  She is Solik normal regular rate and rhythm. Abdomen: Soft, nontender, nondistended.  Bowel sounds are heard.   Extremities: No cyanosis, clubbing or edema.  Right BKA with prosthesis. Psych: Alert, awake and oriented, normal mood CNS:  No cranial nerve deficits.  Power equal in all extremities.   Skin: Warm and dry.  No rashes noted.  Data Reviewed:   CBC: Recent Labs  Lab 08/22/22 1557 08/23/22 0349  WBC 9.5 5.3  HGB 14.8 12.3*  HCT 43.8 38.0*  MCV 90.3 93.8  PLT 236 163    Basic Metabolic Panel: Recent Labs  Lab 08/22/22 1557 08/23/22 0349  NA 138 138  K 3.6 3.9  CL 106 103  CO2 21* 26  GLUCOSE 148* 135*  BUN 9 7*  CREATININE 0.76 0.86  CALCIUM 8.9 8.6*  MG 1.8  --     Liver Function Tests: Recent Labs  Lab 08/22/22 1557  AST 21  ALT 14  ALKPHOS 49  BILITOT 0.6  PROT 6.7  ALBUMIN 4.1     Radiology Studies: DG Chest Port 1 View  Result Date: 08/22/2022 CLINICAL DATA:  Atrial fibrillation. EXAM: PORTABLE CHEST 1 VIEW COMPARISON:  X-ray 03/27/2020.  CT angiogram earlier 08/22/2022 FINDINGS: Normal cardiopericardial silhouette. Calcified tortuous aorta. No consolidation, pneumothorax or effusion. No edema. Overlapping cardiac leads. Please see separate dictation of CT of the chest from same day IMPRESSION: No acute cardiopulmonary disease. Electronically Signed   By: Karen Kays M.D.   On: 08/22/2022 16:38   CT ANGIO CHEST AORTA W/CM & OR WO/CM  Result Date: 08/22/2022 CLINICAL DATA:  Thoracic aortic aneurysm. EXAM: CT ANGIOGRAPHY CHEST WITH CONTRAST TECHNIQUE: Multidetector CT imaging of the chest was performed using the standard protocol during bolus administration of intravenous contrast. Multiplanar CT image reconstructions and MIPs were obtained to evaluate the vascular anatomy. RADIATION DOSE REDUCTION: This exam was performed according to the departmental dose-optimization program which includes automated exposure control,  adjustment of the mA and/or kV according to patient size and/or use of iterative reconstruction technique. CONTRAST:  75mL ISOVUE-370 IOPAMIDOL (ISOVUE-370) INJECTION 76% COMPARISON:  December 30, 2021. FINDINGS: Cardiovascular: Grossly stable 5 cm ascending thoracic aortic aneurysm is noted. No dissection is noted. Great vessels are widely patent without stenosis. Normal cardiac size. Coronary calcifications are noted. Mediastinum/Nodes: Postsurgical changes seen involving distal esophagus. No adenopathy is noted. No definite thyroid abnormality is noted. Lungs/Pleura: No pneumothorax or pleural effusion is noted. Minimal bibasilar subsegmental atelectasis or scarring is noted. Stable 5 mm nodule is noted in right middle lobe best seen on image number 105 of series 11. Upper Abdomen: Cholelithiasis.  Status post gastric bypass. Musculoskeletal: No chest wall abnormality. No acute or significant osseous findings. Review of the MIP images confirms the above findings. IMPRESSION: Grossly stable 5 cm Ascending thoracic aortic aneurysm. Recommend semi-annual imaging followup by CTA or MRA and referral to cardiothoracic surgery if not already obtained. This recommendation follows 2010 ACCF/AHA/AATS/ACR/ASA/SCA/SCAI/SIR/STS/SVM Guidelines for the Diagnosis and Management of Patients With Thoracic Aortic Disease. Circulation. 2010; 121:  [phone]. Aortic aneurysm NOS (ICD10-I71.9). Stable 5 mm nodule is noted in right middle lobe. Attention to this on follow-up imaging is recommended. Coronary artery calcifications are noted suggesting coronary artery disease. Cholelithiasis. Status post gastric bypass. Aortic Atherosclerosis (ICD10-I70.0). Electronically Signed   By: Lupita Raider M.D.   On: 08/22/2022 13:52      LOS: 0 days     Joycelyn Das, MD [facility] Available via Epic secure chat 7am-7pm After these hours, please refer to coverage provider listed on [email or web address] 08/23/2022, 10:16 AM

## 2022-08-23 NOTE — Assessment & Plan Note (Signed)
-  presented with HR 150 and received 5mg  of metoprolol in the ED with subsequent conversion to sinus rhythm and junctional rhythm. Now in sinus bradycardia in 40s and asymptomatic -CHA2DS2-VASc 4- continue IV heparin -cardiology advised holding any further beta-blocker or antiarrhythmic medication -EP to consult in the morning for consideration of device for trifascicular block

## 2022-08-23 NOTE — Assessment & Plan Note (Signed)
HR in 40s following conversion to sinus rhythm -asymptomatic  -hold any beta-blocker

## 2022-08-23 NOTE — Assessment & Plan Note (Signed)
-  continue aspirin and statin 

## 2022-08-23 NOTE — Progress Notes (Signed)
ANTICOAGULATION CONSULT NOTE - Follow Up Consult  Pharmacy Consult for heparin Indication:  Afib and NSTEMI  Labs: Recent Labs    08/22/22 1557 08/22/22 1751 08/22/22 1913 08/22/22 2055 08/23/22 0349  HGB 14.8  --   --   --  12.3*  HCT 43.8  --   --   --  38.0*  PLT 236  --   --   --  163  HEPARINUNFRC  --   --   --   --  0.23*  CREATININE 0.76  --   --   --  0.86  TROPONINIHS 24* 2,333* 7,113* 11,237*  --     Assessment: 72yo male subtherapeutic on heparin with initial dosing for Afib and NSTEMI; no infusion issues or signs of bleeding per RN.  Goal of Therapy:  Heparin level 0.3-0.7 units/ml   Plan:  2000 units heparin bolus. Increase heparin infusion by 2 units/kg/hr to 1400 units/hr. Check level in 6 hours.   Vernard Gambles, PharmD, BCPS 08/23/2022 5:05 AM

## 2022-08-23 NOTE — Progress Notes (Signed)
ANTICOAGULATION CONSULT NOTE  Pharmacy Consult for Heparin Indication: chest pain/ACS  Allergies  Allergen Reactions   Other Other (See Comments)   Moxifloxacin Rash    Avelox Rash at injection site   Penicillins Rash     Has patient had a PCN reaction causing immediate rash, facial/tongue/throat swelling, SOB or lightheadedness with hypotension: #  #  #  YES  #  #  #  Has patient had a PCN reaction causing severe rash involving mucus membranes or skin necrosis: No Has patient had a PCN reaction that required hospitalization unknown Has patient had a PCN reaction occurring within the last 10 years: No If all of the above answers are "NO", then may proceed with Cephalosporin use.   Quinolones Itching   Sulfamethoxazole-Trimethoprim Itching, Rash and Other (See Comments)    Patient Measurements: Height: 6\' 8"  (203.2 cm) Weight: 101.2 kg (223 lb 1.7 oz) IBW/kg (Calculated) : 96 Heparin Dosing Weight: 101 kg  Vital Signs: Temp: 97.8 F (36.6 C) (07/31 1217) Temp Source: Oral (07/31 1217) BP: 125/59 (07/31 1217) Pulse Rate: 45 (07/31 1217)  Labs: Recent Labs    08/22/22 1557 08/22/22 1751 08/22/22 1913 08/22/22 2055 08/23/22 0349 08/23/22 1146  HGB 14.8  --   --   --  12.3*  --   HCT 43.8  --   --   --  38.0*  --   PLT 236  --   --   --  163  --   HEPARINUNFRC  --   --   --   --  0.23* 0.22*  CREATININE 0.76  --   --   --  0.86  --   TROPONINIHS 24* 2,333* 7,113* 11,237*  --   --     Estimated Creatinine Clearance: 102.3 mL/min (by C-G formula based on SCr of 0.86 mg/dL).   Medical History: Past Medical History:  Diagnosis Date   Anemia    low iron   Arthritis    Coronary artery disease 2006   a. remote stenting to ramus, prox LAD x2.   Dental crowns present    Diabetes mellitus    First degree AV block    GERD (gastroesophageal reflux disease)    Heart attack (HCC) 06/2004   Heart murmur    aortic   Hx MRSA infection 02/2006   Hyperlipidemia     Hypertension    hx. of - has not been on med. since losing wt. after gastric bypass   Hypothyroidism    Morbid obesity (HCC)    Mucoid cyst of joint 09/2011   left thumb   Sleep apnea    sleep study 03/29/2011; no CPAP use, lost >130lbs   Trifascicular block     Assessment: 87 YOM presenting with new onset Afib. Not on anticoagulation PTA. Pharmacy consulted to dose heparin. -heparin level = 0.22 on 1400 units/hr  Goal of Therapy:  Heparin level 0.3-0.7 units/ml Monitor platelets by anticoagulation protocol: Yes   Plan:  -Increase heparin to 1600 units/hr -Will follow plans post cath  Harland German, PharmD Clinical Pharmacist **Pharmacist phone directory can now be found on [email or web address] ([email or web address]).  Listed under [facility].

## 2022-08-23 NOTE — TOC Initial Note (Signed)
Transition of Care [facility]) - Initial/Assessment Note    Patient Details  Name: [name] MRN: [id number] Date of Birth: May 31, 1950  Transition of Care [facility]) CM/SW Contact:    Leone Haven, RN Phone Number: 08/23/2022, 4:21 PM  Clinical Narrative:                 From home with wife, self ambulates with prosthectic, has PCP, Dr. Margaretann Loveless, has insurance on file [place], [place], [place] with medication coverage.  He states he currently has no HH services in place.  He has walker x 2, knee scooter, and crutches.  His wife will transport him home at dc and she is his support system.  [facility] [employer] [facility] [address].    Expected Discharge Plan: Home/Self Care Barriers to Discharge: Continued Medical Work up   Patient Goals and CMS Choice Patient states their goals for this hospitalization and ongoing recovery are:: return home with wife   Choice offered to / list presented to : NA      Expected Discharge Plan and Services In-house Referral: NA Discharge Planning Services: CM Consult Post Acute Care Choice: NA Living arrangements for the past 2 months: Single Family Home                 DME Arranged: N/A DME Agency: NA       HH Arranged: NA          Prior Living Arrangements/Services Living arrangements for the past 2 months: Single Family Home Lives with:: Spouse Patient language and need for interpreter reviewed:: Yes Do you feel safe going back to the place where you live?: Yes      Need for Family Participation in Patient Care: Yes (Comment) Care giver support system in place?: Yes (comment) Current home services: DME (walker x 2, knee scooter, crutches) Criminal Activity/Legal Involvement Pertinent to Current Situation/Hospitalization: No - Comment as needed  Activities of Daily Living Home Assistive Devices/Equipment: Prosthesis ADL Screening (condition at time of admission) Patient's cognitive ability adequate to safely complete daily  activities?: Yes Is the patient deaf or have difficulty hearing?: No Does the patient have difficulty seeing, even when wearing glasses/contacts?: No Does the patient have difficulty concentrating, remembering, or making decisions?: No Patient able to express need for assistance with ADLs?: Yes Does the patient have difficulty dressing or bathing?: No Independently performs ADLs?: Yes (appropriate for developmental age) Does the patient have difficulty walking or climbing stairs?: Yes Weakness of Legs: Right Weakness of Arms/Hands: None  Permission Sought/Granted Permission sought to share information with : Case Manager Permission granted to share information with : Yes, Verbal Permission Granted              Emotional Assessment Appearance:: Appears stated age Attitude/Demeanor/Rapport: Engaged Affect (typically observed): Appropriate Orientation: : Oriented to Self, Oriented to Place, Oriented to  Time, Oriented to Situation Alcohol / Substance Use: Not Applicable Psych Involvement: No (comment)  Admission diagnosis:  Bradycardia [R00.1] Atrial fibrillation with RVR (HCC) [I48.91] NSTEMI (non-ST elevated myocardial infarction) [facility]) [I21.4] Patient Active Problem List   Diagnosis Date Noted   Paroxysmal atrial fibrillation with RVR (HCC) 08/23/2022   Ascending aortic aneurysm (HCC) 08/23/2022   Bradycardia 08/22/2022   NSTEMI (non-ST elevated myocardial infarction) (HCC) 08/22/2022   Atrial fibrillation with RVR (HCC) 08/22/2022   Hypotension 01/07/2021   S/P arthroscopy of right shoulder 06/02/2020   Closed fracture of neck of right femur (HCC)    Hip  fracture (HCC) 03/27/2020   Nontraumatic complete tear of right rotator cuff 02/26/2020   S/P transmetatarsal amputation of foot, left (HCC) 04/25/2016   Chronic osteomyelitis of toe, left (HCC)    Chronic diastolic CHF (congestive heart failure) (HCC) 09/25/2015   Aortic stenosis 08/04/2014   Diabetic foot infection  (HCC) 06/03/2014   Peripheral vascular disease (HCC) 06/03/2014   Coronary artery disease involving native coronary artery of native heart without angina pectoris 07/29/2013   Trifascicular block 07/29/2013   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 07/26/2011   Hypothyroidism 01/22/2007   Type 2 diabetes mellitus with vascular disease (HCC) 01/22/2007   HYPERLIPIDEMIA, MIXED 01/22/2007   MYOCARDIAL INFARCTION, HX OF 01/22/2007   ANEMIA, IRON DEFICIENCY, HX OF 01/22/2007   PCP:  Thana Ates, MD Pharmacy:   [phone] [employer] - [place], [place] - [address] [place] [address] Phone: [phone] Fax: [phone]     Social Determinants of Health (SDOH) Social History: SDOH Screenings   Food Insecurity: No Food Insecurity (08/22/2022)  Housing: Low Risk  (08/22/2022)  Transportation Needs: No Transportation Needs (08/22/2022)  Utilities: Not At Risk (08/22/2022)  Tobacco Use: Low Risk  (08/23/2022)   SDOH Interventions:     Readmission Risk Interventions     No data to display

## 2022-08-23 NOTE — Hospital Course (Signed)
[name] is a 72 y.o. male with medical history significant of CAD s/p stents, aortic valve sclerosis, AAA, first degree AV block, trifascicular block, HTN, HLD T2DM, morbid obesity, hypothyroidism was seen outpatient by CT surgery for routine follow up surveillance of his aortic aneurysm. He reported feeling of dizziness and chest discomfort earlier in the day and was noted to have tachyarrhythmia in the 140-150 likely atrial fibrillation and sent to ED. He had colonoscopy yesterday with 2 days of prep and had been feeling more fatigue. He also mention episode of palpitation about a year ago with 2 weeks of cardiac monitoring without much findings.  In the ED, he was in atrial fibrillation with RVR with soft blood pressure. He was given 5mg  dose of metoprolol with conversion to sinus rhythm/junctional rhythm. Cardiology Dr. Jacques Navy was consulted by ED physician and was admitted to the hospital for further evaluation and treatment.  Assessment and Plan: * Paroxysmal atrial fibrillation with RVR (HCC) Presented with HR 150 and received 5mg  of metoprolol in the ED with subsequent conversion to sinus rhythm and junctional rhythm. Now in sinus bradycardia in 40s and asymptomatic. CHA2DS2-VASc 4- continue IV heparin. Cardiology advised holding any further beta-blocker or antiarrhythmic medication. EP to consult in the morning for consideration of device for trifascicular block   NSTEMI (non-ST elevated myocardial infarction) (HCC)  Troponin 2333 -->9563->87564.  Known history of CAD with multiple stents.  Cardiology on board.  Continue aspirin and heparin drip holding beta-blocker due to bradycardia.   Ascending aortic aneurysm (HCC) -Follows with CT surgery. Recent CT showing stable aneurysm at 5cm  Peripheral vascular disease (HCC) -continue aspirin and statin   Trifascicular block -hx of first degree AV block, RBBB and LAFB -EP to consult for consideration of pacemaker   Type 2 diabetes mellitus  with vascular disease (HCC) Latest HbA1C 6.7.  Continue sliding scale insulin, Accu-Cheks, diabetic diet.   Hypothyroidism low TSH and high T4 Dose of synthorid decreased from to with follow up labs with primary in 6-8 weeks

## 2022-08-23 NOTE — Progress Notes (Addendum)
Progress Note  Patient Name: [name] Date of Encounter: 08/23/2022  Primary Cardiologist: Donato Schultz, MD  Subjective   No CP, SOB. Overall feeling better. HR 37-45bpm presently, asymptomatic. Reports HR at home usually 50s but reports Dr. Anne Fu previously gave [facility] he may need PPM at some point.   Relays story of Charcot foot leading to R BKA many years ago.  Inpatient Medications    Scheduled Meds:  aspirin  81 mg Oral Daily   atorvastatin  10 mg Oral QHS   cyanocobalamin  1,000 mcg Oral Q M,W,F   ferrous sulfate  325 mg Oral Q M,W,F   levothyroxine  125 mcg Oral Q0600   omega-3 acid ethyl esters  1,000 mg Oral Daily   Continuous Infusions:  sodium chloride     heparin 1,400 Units/hr (08/23/22 0539)   PRN Meds: sodium chloride, gabapentin   Vital Signs    Vitals:   08/22/22 2114 08/22/22 2317 08/23/22 0037 08/23/22 0506  BP:  107/72 107/72 114/67  Pulse: (!) 45  (!) 45   Resp:  15 15 15   Temp:  97.7 F (36.5 C) 97.7 F (36.5 C) 97.7 F (36.5 C)  TempSrc:  Oral Oral Oral  SpO2: 99%     Weight:   101.1 kg 101.2 kg  Height:   6\' 8"  (2.032 m)     Intake/Output Summary (Last 24 hours) at 08/23/2022 0753 Last data filed at 08/22/2022 2119 Gross per 24 hour  Intake 658.33 ml  Output --  Net 658.33 ml      08/23/2022    5:06 AM 08/23/2022   12:37 AM 08/22/2022    1:42 PM  Last 3 Weights  Weight (lbs) 223 lb 1.7 oz 222 lb 14.2 oz 223 lb  Weight (kg) 101.2 kg 101.1 kg 101.152 kg     Telemetry    SB 37-45bpm - Personally Reviewed  ECG    Initially AF RVR RBBB+LAFB then junctional rhythm with occasional PACs, one ectopic atrial beat, RBBB+LAFB - Personally Reviewed  Physical Exam   GEN: No acute distress.  HEENT: Normocephalic, atraumatic, sclera non-icteric. Neck: No JVD or bruits. Cardiac: Reg rhythm, bradycardic, no murmurs, rubs, or gallops.  Respiratory: Clear to auscultation bilaterally. Breathing is unlabored. GI: Soft, nontender,  non-distended, BS +x 4. MS: no deformity. Extremities: No clubbing or cyanosis. No edema. S/p R BKA. Neuro:  AAOx3. Follows commands. Psych:  Responds to questions appropriately with a normal affect.  Labs    High Sensitivity Troponin:   Recent Labs  Lab 08/22/22 1557 08/22/22 1751 08/22/22 1913 08/22/22 2055  TROPONINIHS 24* 2,333* 7,113* 11,237*      Cardiac EnzymesNo results for input(s): "TROPONINI" in the last 168 hours. No results for input(s): "TROPIPOC" in the last 168 hours.   Chemistry Recent Labs  Lab 08/22/22 1557 08/23/22 0349  NA 138 138  K 3.6 3.9  CL 106 103  CO2 21* 26  GLUCOSE 148* 135*  BUN 9 7*  CREATININE 0.76 0.86  CALCIUM 8.9 8.6*  PROT 6.7  --   ALBUMIN 4.1  --   AST 21  --   ALT 14  --   ALKPHOS 49  --   BILITOT 0.6  --   GFRNONAA >60 >60  ANIONGAP 11 9     Hematology Recent Labs  Lab 08/22/22 1557 08/23/22 0349  WBC 9.5 5.3  RBC 4.85 4.05*  HGB 14.8 12.3*  HCT 43.8 38.0*  MCV 90.3 93.8  MCH 30.5 30.4  MCHC 33.8 32.4  RDW 13.5 13.6  PLT 236 163    BNP Recent Labs  Lab 08/22/22 1557  BNP 280.8*     DDimer No results for input(s): "DDIMER" in the last 168 hours.   Radiology    DG Chest Port 1 View  Result Date: 08/22/2022 CLINICAL DATA:  Atrial fibrillation. EXAM: PORTABLE CHEST 1 VIEW COMPARISON:  X-ray 03/27/2020.  CT angiogram earlier 08/22/2022 FINDINGS: Normal cardiopericardial silhouette. Calcified tortuous aorta. No consolidation, pneumothorax or effusion. No edema. Overlapping cardiac leads. Please see separate dictation of CT of the chest from same day IMPRESSION: No acute cardiopulmonary disease. Electronically Signed   By: Karen Kays M.D.   On: 08/22/2022 16:38   CT ANGIO CHEST AORTA W/CM & OR WO/CM  Result Date: 08/22/2022 CLINICAL DATA:  Thoracic aortic aneurysm. EXAM: CT ANGIOGRAPHY CHEST WITH CONTRAST TECHNIQUE: Multidetector CT imaging of the chest was performed using the standard protocol during  bolus administration of intravenous contrast. Multiplanar CT image reconstructions and MIPs were obtained to evaluate the vascular anatomy. RADIATION DOSE REDUCTION: This exam was performed according to the departmental dose-optimization program which includes automated exposure control, adjustment of the mA and/or kV according to patient size and/or use of iterative reconstruction technique. CONTRAST:  75mL ISOVUE-370 IOPAMIDOL (ISOVUE-370) INJECTION 76% COMPARISON:  December 30, 2021. FINDINGS: Cardiovascular: Grossly stable 5 cm ascending thoracic aortic aneurysm is noted. No dissection is noted. Great vessels are widely patent without stenosis. Normal cardiac size. Coronary calcifications are noted. Mediastinum/Nodes: Postsurgical changes seen involving distal esophagus. No adenopathy is noted. No definite thyroid abnormality is noted. Lungs/Pleura: No pneumothorax or pleural effusion is noted. Minimal bibasilar subsegmental atelectasis or scarring is noted. Stable 5 mm nodule is noted in right middle lobe best seen on image number 105 of series 11. Upper Abdomen: Cholelithiasis.  Status post gastric bypass. Musculoskeletal: No chest wall abnormality. No acute or significant osseous findings. Review of the MIP images confirms the above findings. IMPRESSION: Grossly stable 5 cm Ascending thoracic aortic aneurysm. Recommend semi-annual imaging followup by CTA or MRA and referral to cardiothoracic surgery if not already obtained. This recommendation follows 2010 ACCF/AHA/AATS/ACR/ASA/SCA/SCAI/SIR/STS/SVM Guidelines for the Diagnosis and Management of Patients With Thoracic Aortic Disease. Circulation. 2010; 121: V253-G644. Aortic aneurysm NOS (ICD10-I71.9). Stable 5 mm nodule is noted in right middle lobe. Attention to this on follow-up imaging is recommended. Coronary artery calcifications are noted suggesting coronary artery disease. Cholelithiasis. Status post gastric bypass. Aortic Atherosclerosis (ICD10-I70.0).  Electronically Signed   By: Lupita Raider M.D.   On: 08/22/2022 13:52    Cardiac Studies   Pending echo  Patient Profile     72 y.o. male with CAD (PCI 2006 ramus and LAD with diffused RCA disease), known bradycardia/trifascicular block not on BB due to this, mild AI, 5cm ascending aortic aneurysm (stable by CT 07/2022), 5mm pulm nodule by CT to be followed in future, HTN, HLD, DM, OSA, gastric bypass, Charcot foot s/p remote R BKA. Had recent colonoscopy 7/29 requiring double prep. Then on 7/30 went to get CT to follow TAA. Following contrast administration felt sensation of uneasiness, lightheadedness, found to have elevated HR so sent to ED. Found to be in AF RVR, possibly swinging hyperthyroid by labs, with NSTEMI troponin rising to 11k.  Assessment & Plan    1. NSTEMI, known CAD as above - continue heparin per pharmacy  - got ASA 324mg  overnight, continue 81mg  daily (per d/w cath lab team, 1am ASA 324mg  will suffice for today's  dose) - titrate atorvastatin to 40mg  daily with lipid panel in AM - avoid BB given bradycardia - troponin rose to 11k but without significant angina symptoms - per d/w Dr. Excell Seltzer, plan LHC today - patient reports that uneasiness/tachycardia started after contrast exposure but suspect sx were more related to arrhythmia - will confer with Dr. Excell Seltzer re: pre-medication but no other systemic signs of allergic reaction and no history of such - echo pending  Informed Consent   Shared Decision Making/Informed Consent The risks [stroke (1 in 1000), death (1 in 1000), kidney failure [usually temporary] (1 in 500), bleeding (1 in 200), allergic reaction [possibly serious] (1 in 200)], benefits (diagnostic support and management of coronary artery disease) and alternatives of a cardiac catheterization were discussed in detail with Mr. Bonfante and he is willing to proceed.     2. New onset AF RVR, subsequent junctional rhythm, known trifascicular block/bradycardia with  RBBB+LAFB - spontaneously converted to junctional rhythm then currently sinus bradycardia in the 40s (lowest 37bpm) - got 1 dose of 5mg  Lopressor yesterday afternoon - heparin per pharmacy - anticipate will need EP this admission for tachy-brady, urgent PPM not indicated at this time - d/w Dr. Excell Seltzer, would plan cath first - avoid AVN blocking agents  3. Hypothyroidism with hyperthyroid labs - suppressed TSH and elevated FT4 - adjustment of levothyroxine per primary team  4. 5cm ascending TAA (followed by Dr. Dorris Fetch) with mild AI - anticipate semi-annual imaging unless w/u this admission shows progressive disease requiring cardiac surgery - repeat echo pending - will need OP f/u PCP for pulm nodule felt stable by CT but recommended attention on f/u imaging  5. Mild anemia - Hgb 12.3, per primary team, no bleeding reported, suspect possibly hemoconcentrated from recent colon prep  For questions or updates, please contact [facility] Please consult [email or web address] for contact info under Cardiology/STEMI.  Signed, Laurann Montana, PA-C 08/23/2022, 7:53 AM    Patient seen, examined. Available data reviewed. Agree with findings, assessment, and plan as outlined by Ronie Spies, PA-C.  The patient is independently interviewed and examined.  He is alert, oriented, in no distress.  HEENT is normal, JVP is normal, lungs are clear bilaterally, heart is regular rate and rhythm with a 2/6 systolic murmur at the left lower sternal border, abdomen is soft and nontender, extremities have no edema.  Right leg prosthetic is present.  The patient presents with non-STEMI troponin of 11,000 with an absence of any chest pain.  This occurred in the setting of new onset atrial fibrillation with RVR and then the patient converted spontaneously to junctional rhythm, now with sinus bradycardia.  He has baseline trifascicular block (right bundle branch block, left anterior fascicular block, first-degree AV  block).  Plan cardiac catheterization to evaluate for obstructive CAD with his known history outlined above.  Pending assessment of his coronary anatomy, he will require EP consultation after having atrial fibs with RVR and significant underlying conduction disease.  Will follow-up with him after his heart catheterization.  Reviewed the cardiac catheterization procedure in detail with him. I have reviewed the risks, indications, and alternatives to cardiac catheterization, possible angioplasty, and stenting with the patient. Risks include but are not limited to bleeding, infection, vascular injury, stroke, myocardial infection, arrhythmia, kidney injury, radiation-related injury in the case of prolonged fluoroscopy use, emergency cardiac surgery, and death. The patient understands the risks of serious complication is 1-2 in 1000 with diagnostic cardiac cath and 1-2% or less with angioplasty/stenting.  He requests to be accessed from the left radial artery if possible.  He has had a right rotator cuff problem and is strongly right-hand-dominant.  Tonny Bollman, M.D. 08/23/2022 9:38 AM

## 2022-08-23 NOTE — Interval H&P Note (Signed)
History and Physical Interval Note:  08/23/2022 4:50 PM  [name]  has presented today for surgery, with the diagnosis of nstemi.  The various methods of treatment have been discussed with the patient and family. After consideration of risks, benefits and other options for treatment, the patient has consented to  Procedure(s): LEFT HEART CATH AND CORONARY ANGIOGRAPHY (N/A) as a surgical intervention.  The patient's history has been reviewed, patient examined, no change in status, stable for surgery.  I have reviewed the patient's chart and labs.  Questions were answered to the patient's satisfaction.   Cath Lab Visit (complete for each Cath Lab visit)  Clinical Evaluation Leading to the Procedure:   ACS: Yes.    Non-ACS:    Anginal Classification: CCS I  Anti-ischemic medical therapy: No Therapy  Non-Invasive Test Results: No non-invasive testing performed  Prior CABG: No previous CABG        Theron Arista [facility] 08/23/2022 4:50 PM

## 2022-08-23 NOTE — Assessment & Plan Note (Signed)
-  hx of first degree AV block, RBBB and LAFB -EP to consult for consideration of pacemaker

## 2022-08-23 NOTE — Assessment & Plan Note (Signed)
-  HbA1C 6.7 -Keep on SSI

## 2022-08-24 ENCOUNTER — Other Ambulatory Visit (HOSPITAL_COMMUNITY): Payer: Self-pay

## 2022-08-24 ENCOUNTER — Encounter (HOSPITAL_COMMUNITY): Payer: Self-pay | Admitting: Cardiology

## 2022-08-24 DIAGNOSIS — R001 Bradycardia, unspecified: Secondary | ICD-10-CM | POA: Diagnosis not present

## 2022-08-24 DIAGNOSIS — I214 Non-ST elevation (NSTEMI) myocardial infarction: Secondary | ICD-10-CM | POA: Diagnosis not present

## 2022-08-24 DIAGNOSIS — I480 Paroxysmal atrial fibrillation: Secondary | ICD-10-CM | POA: Diagnosis not present

## 2022-08-24 DIAGNOSIS — I7121 Aneurysm of the ascending aorta, without rupture: Secondary | ICD-10-CM | POA: Diagnosis not present

## 2022-08-24 DIAGNOSIS — I4891 Unspecified atrial fibrillation: Secondary | ICD-10-CM | POA: Diagnosis not present

## 2022-08-24 MED ORDER — APIXABAN 5 MG PO TABS
5.0000 mg | ORAL_TABLET | Freq: Two times a day (BID) | ORAL | Status: DC
  Administered 2022-08-24: 5 mg via ORAL
  Filled 2022-08-24: qty 1

## 2022-08-24 MED ORDER — APIXABAN 5 MG PO TABS
5.0000 mg | ORAL_TABLET | Freq: Two times a day (BID) | ORAL | 3 refills | Status: DC
Start: 2022-08-24 — End: 2023-04-16
  Filled 2022-08-24: qty 60, 30d supply, fill #0

## 2022-08-24 MED ORDER — LEVOTHYROXINE SODIUM 125 MCG PO TABS
125.0000 ug | ORAL_TABLET | Freq: Every day | ORAL | 3 refills | Status: DC
  Filled 2022-08-24: qty 90, 90d supply, fill #0

## 2022-08-24 MED ORDER — CLOPIDOGREL BISULFATE 75 MG PO TABS
75.0000 mg | ORAL_TABLET | Freq: Every day | ORAL | 3 refills | Status: DC
  Filled 2022-08-24: qty 30, 30d supply, fill #0

## 2022-08-24 MED ORDER — CLOPIDOGREL BISULFATE 75 MG PO TABS: 75.0000 mg | ORAL_TABLET | Freq: Every day | ORAL | 3 refills | Status: DC

## 2022-08-24 MED ORDER — LEVOTHYROXINE SODIUM 150 MCG PO TABS
125.0000 ug | ORAL_TABLET | Freq: Every day | ORAL | 2 refills | Status: DC
Start: 2022-08-24 — End: 2022-08-24
  Filled 2022-08-24: qty 30, 30d supply, fill #0

## 2022-08-24 MED ORDER — LEVOTHYROXINE SODIUM 125 MCG PO TABS: 125.0000 ug | ORAL_TABLET | Freq: Every day | ORAL | 3 refills | Status: AC

## 2022-08-24 MED ORDER — ASPIRIN EC 81 MG PO TBEC: 81.0000 mg | DELAYED_RELEASE_TABLET | Freq: Every day | ORAL | Status: DC

## 2022-08-24 MED ORDER — POTASSIUM CHLORIDE CRYS ER 20 MEQ PO TBCR
40.0000 meq | EXTENDED_RELEASE_TABLET | ORAL | Status: AC
  Administered 2022-08-24 (×2): 40 meq via ORAL
  Filled 2022-08-24 (×2): qty 2

## 2022-08-24 MED ORDER — ATORVASTATIN CALCIUM 40 MG PO TABS: 40.0000 mg | ORAL_TABLET | Freq: Every day | ORAL | 2 refills | Status: AC

## 2022-08-24 MED ORDER — ASPIRIN EC 81 MG PO TBEC: 81.0000 mg | DELAYED_RELEASE_TABLET | Freq: Every day | ORAL | Status: AC

## 2022-08-24 MED ORDER — ATORVASTATIN CALCIUM 40 MG PO TABS
40.0000 mg | ORAL_TABLET | Freq: Every day | ORAL | 2 refills | Status: DC
  Filled 2022-08-24: qty 30, 30d supply, fill #0

## 2022-08-24 NOTE — Consult Note (Signed)
   [facility] Inpatient Consult   08/24/2022  ASAN STLOUIS Jun 03, 1950 [id number]  [facility] [[facility]]  Accountable Care Organization [ACO] Patient: Medicare ACO REACH  Primary Care Provider:  Thana Ates, MD with Deboraha Sprang at [facility]   Patient screened for hospitalization with noted low risk score was in observation status on 08/23/22 when reviewed.  Review of patient's electronic medical record today for post procedure review for needs.   Plan:  No additional needs noted will have follow up listed with PCP in 1 week.  Of note, [facility] Care Management/Population Health does not replace or interfere with any arrangements made by the Inpatient Transition of Care team.  For questions contact:   Charlesetta Shanks, RN BSN CCM [facility]  [phone] business mobile phone Toll free office [phone]  *Concierge Line  [phone] Fax number: [phone] [email or web address]

## 2022-08-24 NOTE — Consult Note (Signed)
ELECTROPHYSIOLOGY CONSULT NOTE    Patient ID: [name] MRN: [id number], DOB/AGE: February 15, 1950 72 y.o.  Admit date: 08/22/2022 Date of Consult: 08/24/2022  Primary Physician: Thana Ates, MD Primary Cardiologist: Donato Schultz, MD  Electrophysiologist: New   Referring Provider: Dr. Excell Seltzer  Patient Profile: [name] is a 72 y.o. male with a history of CAD, known trifascicular block,5cm AAA stable by CT 07/2022, HTN, HLD, DM2, OSA, gastric bypass, charcot foot s/p remote R BKA  who is being seen today for the evaluation of AF and bradycardia at the request of Dr. Excell Seltzer.  HPI:  Pt had recent colonoscopy 7/29 requiring double prep. Then on 7/30 went to get CT to follow TAA. Following contrast administration felt sensation of uneasiness, lightheadedness, found to have elevated HR so sent to ED. Found to be in AF RVR, possibly swinging hyperthyroid by labs, with NSTEMI troponin rising to 11k.   AF converted with a dose of IV lopressor into junctional bradycardia -> sinus brady and has maintained sinus brady in 40-50s  Underwent LHC which showed severe 2 vessel obstructive CAD. 90% proximal LAD stenosis just proximal to and in the very proximal portion of the prior stent. Tandem 99% stenoses in the proximal and mid RCA. Distal RCA and ramus intermediate stents are patent.  Had successful PCI of Proximal LAD with DES x 1 overlapping with prior stent ; Unsuccessful PCI of the RCA due to inability to cross the second lesion with a wire.  Plan for DAPT with ASA for 1-2 weeks, plavix for 12 months, and OK to start Eliquis.   EP asked to see given ongoing bradycardia and episode of AF.  Today, pt is feeling OK. Bradycardia has chronic component, with HRs in low 50s going back to 2021.  Denies syncope or near syncope. States mother had a pacemaker and father also had a low resting HR.   Labs Potassium3.1* (08/01 9562) Magnesium  1.8 (07/30 1557) Creatinine, ser  0.83 (08/01 0058) PLT  187 (08/01  0058) HGB  12.4* (08/01 0058) WBC 6.4 (08/01 0058) Troponin I (High Sensitivity)11,237* (07/30 2055).    Past Medical History:  Diagnosis Date   Anemia    low iron   Arthritis    Coronary artery disease 2006   a. remote stenting to ramus, prox LAD x2.   Dental crowns present    Diabetes mellitus    First degree AV block    GERD (gastroesophageal reflux disease)    Heart attack (HCC) 06/2004   Heart murmur    aortic   Hx MRSA infection 02/2006   Hyperlipidemia    Hypertension    hx. of - has not been on med. since losing wt. after gastric bypass   Hypothyroidism    Morbid obesity (HCC)    Mucoid cyst of joint 09/2011   left thumb   Sleep apnea    sleep study 03/29/2011; no CPAP use, lost >130lbs   Trifascicular block      Surgical History:  Past Surgical History:  Procedure Laterality Date   AMPUTATION Left 04/19/2016   Procedure: Left Foot 4th Toe Amputation vs. Transmetatarsal;  Surgeon: Nadara Mustard, MD;  Location: [facility] OR;  Service: Orthopedics;  Laterality: Left;   BIOPSY  04/07/2019   Procedure: BIOPSY;  Surgeon: Kathi Der, MD;  Location: [facility];  Service: Gastroenterology;;   CATARACT EXTRACTION  02/2010; 03/2010   COLONOSCOPY WITH PROPOFOL N/A 09/14/2014   Procedure: COLONOSCOPY WITH PROPOFOL;  Surgeon: Jone Baseman  Laural Benes, MD;  Location: [facility];  Service: Endoscopy;  Laterality: N/A;   COLONOSCOPY WITH PROPOFOL N/A 04/07/2019   Procedure: COLONOSCOPY WITH PROPOFOL;  Surgeon: Kathi Der, MD;  Location: [facility];  Service: Gastroenterology;  Laterality: N/A;   CORONARY ANGIOPLASTY WITH STENT PLACEMENT  07/12/2004; 08/03/2004   total of 3 stents   CORONARY STENT INTERVENTION N/A 08/23/2022   Procedure: CORONARY STENT INTERVENTION;  Surgeon: Swaziland, Peter M, MD;  Location: [facility] INVASIVE CV LAB;  Service: Cardiovascular;  Laterality: N/A;   I & D EXTREMITY Right 06/04/2014   Procedure: IRRIGATION AND DEBRIDEMENT EXTREMITY;  Surgeon: Kathryne Hitch, MD;  Location: [facility] OR;  Service: Orthopedics;  Laterality: Right;   LEFT HEART CATH AND CORONARY ANGIOGRAPHY N/A 08/23/2022   Procedure: LEFT HEART CATH AND CORONARY ANGIOGRAPHY;  Surgeon: Swaziland, Peter M, MD;  Location: [facility] INVASIVE CV LAB;  Service: Cardiovascular;  Laterality: N/A;   MASS EXCISION  10/04/2011   Procedure: EXCISION MASS;  Surgeon: Nicki Reaper, MD;  Location: [facility];  Service: Orthopedics;  Laterality: Left;  excision cyst debridment ip joint of left thumb   PROSTATECTOMY     right below knee amputation  2018   ROUX-EN-Y GASTRIC BYPASS  10/10/2010   laparoscopic   SHOULDER ARTHROSCOPY WITH ROTATOR CUFF REPAIR AND SUBACROMIAL DECOMPRESSION Right 03/23/2020   Procedure: RIGHT SHOULDER ARTHROSCOPY WITH DEBRIDEMENT AND  ROTATOR CUFF REPAIR;  Surgeon: Kathryne Hitch, MD;  Location: [facility];  Service: Orthopedics;  Laterality: Right;   TOE AMPUTATION  02/23/2006   left foot second ray amputation   TOTAL HIP ARTHROPLASTY Right 03/29/2020   Procedure: TOTAL HIP ARTHROPLASTY POSTERIOR APPROACH;  Surgeon: Tarry Kos, MD;  Location: [facility] OR;  Service: Orthopedics;  Laterality: Right;   TRANSMETATARSAL AMPUTATION Left    TRIGGER FINGER RELEASE Right 09/16/2019   Procedure: RELEASE TRIGGER FINGER/A-1 PULLEY;  Surgeon: Cindee Salt, MD;  Location: [facility];  Service: Orthopedics;  Laterality: Right;  IV REGIONAL FOREARM BLOCK     Medications Prior to Admission  Medication Sig Dispense Refill Last Dose   amoxicillin (AMOXIL) 500 MG tablet Take 4 tablets by mouth 1 hour prior to dental procedure. (Patient taking differently: Take 1,000 mg by mouth daily as needed (For dental procedure only).) 4 tablet 2 >51months agao at unknown   aspirin EC 81 MG tablet Take 81 mg by mouth daily.   08/22/2022   atorvastatin (LIPITOR) 10 MG tablet Take 10 mg by mouth at bedtime.    08/21/2022   Calcium Citrate-Vitamin D (CALCIUM CITRATE CHEWY BITE PO) Take 500 mg by  mouth 3 (three) times daily.   08/21/2022   ferrous sulfate 325 (65 FE) MG tablet Take 325 mg by mouth See admin instructions. Take one tablet by mouth on Monday, Wednesday and Fridays   08/16/2022   fluticasone (FLONASE) 50 MCG/ACT nasal spray Place 2 sprays into both nostrils daily as needed for allergies.    Past Week   gabapentin (NEURONTIN) 100 MG capsule TAKE 1 CAPSULE (100 MG TOTAL) BY MOUTH 4 (FOUR) TIMES DAILY. WHEN NECESSARY FOR NEUROPATHY PAIN (Patient taking differently: Take 100 mg by mouth 4 (four) times daily.) 120 capsule 3 08/22/2022   Hypromellose (GENTEAL SEVERE OP) Place 1 drop into the right eye daily as needed (Dry eyes).   Past Week   ketoconazole (NIZORAL) 2 % cream Apply 1 Application topically daily as needed for rash.   Past Month   levothyroxine (SYNTHROID) 150 MCG tablet  Take 150 mcg by mouth daily before breakfast.   08/22/2022   lisinopril (ZESTRIL) 2.5 MG tablet TAKE 1 TABLET BY MOUTH EVERY DAY (Patient taking differently: Take 2.5 mg by mouth daily.) 90 tablet 3 08/22/2022   metFORMIN (GLUCOPHAGE-XR) 500 MG 24 hr tablet Take 1,000 mg by mouth 2 (two) times daily.   08/22/2022   Multiple Vitamin (MULTIVITAMIN WITH MINERALS) TABS tablet Take 1 tablet by mouth in the morning and at bedtime. Gummies (Patient taking differently: Take 2 tablets by mouth in the morning and at bedtime. Gummies)   08/22/2022   Multiple Vitamins-Minerals (PRESERVISION AREDS 2) CAPS Take 1 capsule by mouth daily.   08/22/2022   nitroGLYCERIN (NITROSTAT) 0.4 MG SL tablet Place 1 tablet (0.4 mg total) under the tongue every 5 (five) minutes x 3 doses as needed for chest pain. 25 tablet 3 unknown   Omega 3 1000 MG CAPS Take 1,000 mg by mouth daily.   08/22/2022   Omeprazole 20 MG TBEC Take 20 mg by mouth daily as needed (acid reflux/ indigestion).    Past Week   Valerian Root 450 MG CAPS Take 450 mg by mouth at bedtime.   08/21/2022   vitamin B-12 (CYANOCOBALAMIN) 1000 MCG tablet Take 1,000 mcg by mouth 3  (three) times a week. Mon, wed, Friday   08/16/2022    Inpatient Medications:   apixaban  5 mg Oral BID   aspirin  81 mg Oral Daily   atorvastatin  40 mg Oral QHS   clopidogrel  75 mg Oral Q breakfast   cyanocobalamin  1,000 mcg Oral Q M,W,F   ferrous sulfate  325 mg Oral Q M,W,F   levothyroxine  125 mcg Oral Q0600   omega-3 acid ethyl esters  1,000 mg Oral Daily   potassium chloride  40 mEq Oral Q3H   sodium chloride flush  3 mL Intravenous Q12H    Allergies:  Allergies  Allergen Reactions   Other Other (See Comments)   Moxifloxacin Rash    Avelox Rash at injection site   Penicillins Rash     Has patient had a PCN reaction causing immediate rash, facial/tongue/throat swelling, SOB or lightheadedness with hypotension: #  #  #  YES  #  #  #  Has patient had a PCN reaction causing severe rash involving mucus membranes or skin necrosis: No Has patient had a PCN reaction that required hospitalization unknown Has patient had a PCN reaction occurring within the last 10 years: No If all of the above answers are "NO", then may proceed with Cephalosporin use.   Quinolones Itching   Sulfamethoxazole-Trimethoprim Itching, Rash and Other (See Comments)    Family History  Problem Relation Age of Onset   Heart disease Mother      Physical Exam: Vitals:   08/24/22 0445 08/24/22 0450 08/24/22 0455 08/24/22 0743  BP: 119/67   (!) 107/52  Pulse: 67 (!) 56 (!) 48   Resp: 18   17  Temp: 97.9 F (36.6 C)   98.1 F (36.7 C)  TempSrc: Oral   Axillary  SpO2: 99% 99% 99%   Weight:      Height:        GEN- NAD, A&O x 3, normal affect HEENT: Normocephalic, atraumatic Lungs- CTAB, Normal effort.  Heart- Regular rate and rhythm, No M/G/R.  GI- Soft, NT, ND.  Extremities- No clubbing, cyanosis, or edema   Radiology/Studies: CARDIAC CATHETERIZATION  Result Date: 08/23/2022   Prox LAD-1 lesion is 90% stenosed.  Prox RCA-1 lesion is 99% stenosed.   Prox RCA-2 lesion is 99% stenosed.    Previously placed Prox LAD-2 stent of unknown type is  widely patent.   Previously placed Ramus stent of unknown type is  widely patent.   Previously placed Dist RCA stent of unknown type is  widely patent.   A drug-eluting stent was successfully placed using a SYNERGY XD 3.50X12.   Post intervention, there is a 0% residual stenosis.   Post intervention, there is a 0% residual stenosis.   Post intervention, there is a 99% residual stenosis.   Post intervention, there is a 99% residual stenosis.   LV end diastolic pressure is normal.   Recommend to resume Apixaban, at currently prescribed dose and frequency on 08/24/2022.   Recommend concurrent antiplatelet therapy of Aspirin 81 mg for 1 month and Clopidogrel 75mg  daily for 12 months . Severe 2 vessel obstructive CAD. 90% proximal LAD stenosis just proximal to and in the very proximal portion of the prior stent. Tandem 99% stenoses in the proximal and mid RCA. Distal RCA and ramus intermediate stents are patent Normal LVEDP Successful PCI of the proximal LAD with DES x 1 overlapping with prior stent Unsuccessful PCI of the RCA due to inability to cross the second lesion with a wire. Plan: DAPT with ASA for 1-4 weeks, plavix for 12 months. OK to start anticoagulation with Eliquis in am. Further management of arrhythmia per rounding team. If needed I think the RCA could be addressed at a later date using a CTO approach.   ECHOCARDIOGRAM COMPLETE  Result Date: 08/23/2022    ECHOCARDIOGRAM REPORT   Patient Name:   [name] [facility] Date of Exam: 08/23/2022 Medical Rec #:  [id number]   Height:       80.0 in Accession #:    [id number]  Weight:       223.1 lb Date of Birth:  March 07, 1950    BSA:          2.409 m Patient Age:    72 years    BP:           129/58 mmHg Patient Gender: M           HR:           58 bpm. Exam Location:  Inpatient Procedure: 2D Echo, Cardiac Doppler and Color Doppler Indications:    NSTEMI  History:        Patient has prior history of Echocardiogram  examinations, most                 recent 02/02/2022. CHF, Previous Myocardial Infarction and CAD,                 PAD, Arrythmias:Bradycardia, Atrial Fibrillation and AV block,                 Signs/Symptoms:Dizziness/Lightheadedness and Fatigue; Risk                 Factors:Diabetes, Dyslipidemia and Sleep Apnea. AAA.  Sonographer:    Wallie Char Referring Phys: [id number] NATHAN P GOODWIN  Sonographer Comments: Technically difficult study due to poor echo windows. IMPRESSIONS  1. Left ventricular ejection fraction, by estimation, is 60 to 65%. The left ventricle has normal function. The left ventricle has no regional wall motion abnormalities. Left ventricular diastolic parameters are consistent with Grade I diastolic dysfunction (impaired relaxation).  2. Right ventricular systolic function is normal. The right ventricular size is mildly enlarged.  3. The  mitral valve is normal in structure. No evidence of mitral valve regurgitation. No evidence of mitral stenosis.  4. The aortic valve is calcified. There is mild calcification of the aortic valve. There is mild thickening of the aortic valve. Aortic valve regurgitation is trivial. Aortic valve sclerosis is present, with no evidence of aortic valve stenosis.  5. Aortic dilatation noted. There is mild dilatation of the aortic root, measuring 41 mm. There is mild dilatation of the ascending aorta, measuring 44 mm.  6. The inferior vena cava is normal in size with greater than 50% respiratory variability, suggesting right atrial pressure of 3 mmHg. FINDINGS  Left Ventricle: Left ventricular ejection fraction, by estimation, is 60 to 65%. The left ventricle has normal function. The left ventricle has no regional wall motion abnormalities. The left ventricular internal cavity size was normal in size. There is  no left ventricular hypertrophy. Left ventricular diastolic parameters are consistent with Grade I diastolic dysfunction (impaired relaxation). Right Ventricle:  The right ventricular size is mildly enlarged. No increase in right ventricular wall thickness. Right ventricular systolic function is normal. Left Atrium: Left atrial size was normal in size. Right Atrium: Right atrial size was normal in size. Pericardium: There is no evidence of pericardial effusion. Mitral Valve: The mitral valve is normal in structure. Mild mitral annular calcification. No evidence of mitral valve regurgitation. No evidence of mitral valve stenosis. MV peak gradient, 2.2 mmHg. The mean mitral valve gradient is 1.0 mmHg. Tricuspid Valve: The tricuspid valve is normal in structure. Tricuspid valve regurgitation is not demonstrated. No evidence of tricuspid stenosis. Aortic Valve: The aortic valve is calcified. There is mild calcification of the aortic valve. There is mild thickening of the aortic valve. Aortic valve regurgitation is trivial. Aortic valve sclerosis is present, with no evidence of aortic valve stenosis. Aortic valve mean gradient measures 6.0 mmHg. Aortic valve peak gradient measures 11.6 mmHg. Aortic valve area, by VTI measures 3.15 cm. Pulmonic Valve: The pulmonic valve was normal in structure. Pulmonic valve regurgitation is not visualized. No evidence of pulmonic stenosis. Aorta: Aortic dilatation noted. There is mild dilatation of the aortic root, measuring 41 mm. There is mild dilatation of the ascending aorta, measuring 44 mm. Venous: The inferior vena cava is normal in size with greater than 50% respiratory variability, suggesting right atrial pressure of 3 mmHg. IAS/Shunts: No atrial level shunt detected by color flow Doppler.  LEFT VENTRICLE PLAX 2D LVIDd:         4.50 cm      Diastology LVIDs:         3.10 cm      LV e' medial:    6.34 cm/s LV PW:         1.10 cm      LV E/e' medial:  11.9 LV IVS:        1.10 cm      LV e' lateral:   9.52 cm/s LVOT diam:     1.90 cm      LV E/e' lateral: 7.9 LV SV:         135 LV SV Index:   56 LVOT Area:     2.84 cm  LV Volumes (MOD)  LV vol d, MOD A2C: 110.0 ml LV vol d, MOD A4C: 168.0 ml LV vol s, MOD A2C: 39.2 ml LV vol s, MOD A4C: 62.9 ml LV SV MOD A2C:     70.8 ml LV SV MOD A4C:     168.0 ml LV  SV MOD BP:      89.7 ml RIGHT VENTRICLE RV Basal diam:  4.70 cm RV S prime:     13.30 cm/s TAPSE (M-mode): 2.7 cm LEFT ATRIUM             Index        RIGHT ATRIUM           Index LA diam:        5.50 cm 2.28 cm/m   RA Area:     17.90 cm LA Vol (A2C):   56.4 ml 23.41 ml/m  RA Volume:   46.30 ml  19.22 ml/m LA Vol (A4C):   87.7 ml 36.41 ml/m LA Biplane Vol: 72.6 ml 30.14 ml/m  AORTIC VALVE AV Area (Vmax):    3.12 cm AV Area (Vmean):   3.03 cm AV Area (VTI):     3.15 cm AV Vmax:           170.50 cm/s AV Vmean:          117.500 cm/s AV VTI:            0.428 m AV Peak Grad:      11.6 mmHg AV Mean Grad:      6.0 mmHg LVOT Vmax:         187.50 cm/s LVOT Vmean:        125.500 cm/s LVOT VTI:          0.475 m LVOT/AV VTI ratio: 1.11  AORTA Ao Root diam: 4.10 cm Ao Asc diam:  4.40 cm MITRAL VALVE               TRICUSPID VALVE MV Area (PHT): 1.70 cm    TR Peak grad:   27.9 mmHg MV Area VTI:   3.40 cm    TR Vmax:        264.00 cm/s MV Peak grad:  2.2 mmHg MV Mean grad:  1.0 mmHg    SHUNTS MV Vmax:       0.75 m/s    Systemic VTI:  0.48 m MV Vmean:      43.9 cm/s   Systemic Diam: 1.90 cm MV Decel Time: 446 msec MR Peak grad: 126.3 mmHg MR Mean grad: 86.0 mmHg MR Vmax:      562.00 cm/s MR Vmean:     441.0 cm/s MV E velocity: 75.40 cm/s MV A velocity: 71.00 cm/s MV E/A ratio:  1.06 Donato Schultz MD Electronically signed by Donato Schultz MD Signature Date/Time: 08/23/2022/11:42:49 AM    Final    DG Chest Port 1 View  Result Date: 08/22/2022 CLINICAL DATA:  Atrial fibrillation. EXAM: PORTABLE CHEST 1 VIEW COMPARISON:  X-ray 03/27/2020.  CT angiogram earlier 08/22/2022 FINDINGS: Normal cardiopericardial silhouette. Calcified tortuous aorta. No consolidation, pneumothorax or effusion. No edema. Overlapping cardiac leads. Please see separate dictation of CT of  the chest from same day IMPRESSION: No acute cardiopulmonary disease. Electronically Signed   By: Karen Kays M.D.   On: 08/22/2022 16:38   CT ANGIO CHEST AORTA W/CM & OR WO/CM  Result Date: 08/22/2022 CLINICAL DATA:  Thoracic aortic aneurysm. EXAM: CT ANGIOGRAPHY CHEST WITH CONTRAST TECHNIQUE: Multidetector CT imaging of the chest was performed using the standard protocol during bolus administration of intravenous contrast. Multiplanar CT image reconstructions and MIPs were obtained to evaluate the vascular anatomy. RADIATION DOSE REDUCTION: This exam was performed according to the departmental dose-optimization program which includes automated exposure control, adjustment of the mA and/or kV according to patient size and/or  use of iterative reconstruction technique. CONTRAST:  75mL ISOVUE-370 IOPAMIDOL (ISOVUE-370) INJECTION 76% COMPARISON:  December 30, 2021. FINDINGS: Cardiovascular: Grossly stable 5 cm ascending thoracic aortic aneurysm is noted. No dissection is noted. Great vessels are widely patent without stenosis. Normal cardiac size. Coronary calcifications are noted. Mediastinum/Nodes: Postsurgical changes seen involving distal esophagus. No adenopathy is noted. No definite thyroid abnormality is noted. Lungs/Pleura: No pneumothorax or pleural effusion is noted. Minimal bibasilar subsegmental atelectasis or scarring is noted. Stable 5 mm nodule is noted in right middle lobe best seen on image number 105 of series 11. Upper Abdomen: Cholelithiasis.  Status post gastric bypass. Musculoskeletal: No chest wall abnormality. No acute or significant osseous findings. Review of the MIP images confirms the above findings. IMPRESSION: Grossly stable 5 cm Ascending thoracic aortic aneurysm. Recommend semi-annual imaging followup by CTA or MRA and referral to cardiothoracic surgery if not already obtained. This recommendation follows 2010 ACCF/AHA/AATS/ACR/ASA/SCA/SCAI/SIR/STS/SVM Guidelines for the Diagnosis  and Management of Patients With Thoracic Aortic Disease. Circulation. 2010; 121: P619-J093. Aortic aneurysm NOS (ICD10-I71.9). Stable 5 mm nodule is noted in right middle lobe. Attention to this on follow-up imaging is recommended. Coronary artery calcifications are noted suggesting coronary artery disease. Cholelithiasis. Status post gastric bypass. Aortic Atherosclerosis (ICD10-I70.0). Electronically Signed   By: Lupita Raider M.D.   On: 08/22/2022 13:52    EKG: today shows sinus bradycardia at 50 bpm with 1AVB, RBBB, and LAFB (personally reviewed)  TELEMETRY: sinus bradycardia 40-50s (personally reviewed)  Assessment/Plan:  Sinus bradycardia Long term component, though somewhat lower this admission Currently asymptomatic   Would continue to avoid BB If he has further AF with RVR, may push the issue For now, would avoid pacing with limited symptoms and new stent.   PAF New diagnosis.  Eliquis starting today for CHA2DS2/VASc of at least 5   CAD s/p DES to LAD this admission Per primary team.   EP will see as needed. No immediate indication for pacing, but may certainly require in the future.  For questions or updates, please contact [facility] Please consult [email or web address] for contact info under Cardiology/STEMI.  Dustin Flock, PA-C  08/24/2022 9:49 AM

## 2022-08-24 NOTE — Progress Notes (Signed)
Delivered patient's TOC meds to Patience, Flow RN.

## 2022-08-24 NOTE — Progress Notes (Signed)
TR BAND REMOVAL  LOCATION:    left radial  DEFLATED PER PROTOCOL:    Yes.    TIME BAND OFF / DRESSING APPLIED:    2347   SITE UPON ARRIVAL:    Level 0  SITE AFTER BAND REMOVAL:    Level 0  CIRCULATION SENSATION AND MOVEMENT:    Within Normal Limits   Yes.    COMMENTS:   Sterile dressing applied, good capillary refill, pt instructed post cath site care with understanding.

## 2022-08-24 NOTE — Progress Notes (Signed)
CARDIAC REHAB PHASE I   PRE:  Rate/Rhythm: 59 SB  BP:  Sitting: 107/52      SaO2: 96 RA  MODE:  Ambulation: 240 ft   POST:  Rate/Rhythm: 61 SR  BP:  Sitting: 135/67      SaO2: 97 RA   Pt ambulated independently in hallway, tolerated well with no CP, dizziness or SOB. Returned to bed with call bell and bedside table in reach. Post stent/MI education including site care, restrictions, risk factors, exercise guidelines, NTG use, antiplatelet therapy importance, heart healthy diabetic diet, MI booklet and CRP2 reviewed. All questions and concerns addressed. Will refer to [facility] for CRP2. Will continue to follow.   [id number]   Woodroe Chen, RN BSN 08/24/2022 9:42 AM

## 2022-08-24 NOTE — Discharge Summary (Signed)
Physician Discharge Summary  [name] KGM:[id number] DOB: 04-29-1950 DOA: 08/22/2022  PCP: Thana Ates, MD  Admit date: 08/22/2022 Discharge date: 08/24/2022  Admitted From: Home  Discharge disposition: Home   Recommendations for Outpatient Follow-Up:   Follow up with your primary care provider in one week.  Check CBC, BMP, magnesium in the next visit Follow-up with Dr. Anne Fu Cardiology in 1 to 2 weeks/as scheduled by the clinic Dose of Synthroid has been decreased please check TSH in 4 to 6 weeks.  Discharge Diagnosis:   Principal Problem:   Paroxysmal atrial fibrillation with RVR (HCC) Active Problems:   NSTEMI (non-ST elevated myocardial infarction) (HCC)   Hypothyroidism   Type 2 diabetes mellitus with vascular disease (HCC)   Trifascicular block   Peripheral vascular disease (HCC)   Bradycardia   Atrial fibrillation with RVR (HCC)   Ascending aortic aneurysm (HCC)   Discharge Condition: Improved.  Diet recommendation: Low sodium, heart healthy.  Carbohydrate-modified.    Wound care: None.  Code status: Full.   History of Present Illness:   [name] is a 72 y.o. male with medical history significant of CAD s/p stents, aortic valve sclerosis, AAA, first degree AV block, trifascicular block, HTN, HLD T2DM, morbid obesity, hypothyroidism was seen outpatient by CT surgery for routine follow up surveillance of his aortic aneurysm. He reported feeling of dizziness and chest discomfort earlier in the day and was noted to have tachyarrhythmia in the 140-150 likely atrial fibrillation and sent to ED. He had colonoscopy yesterday with 2 days of prep and had been feeling more fatigue. He also mention episode of palpitation about a year ago with 2 weeks of cardiac monitoring without much findings.  In the ED, he was in atrial fibrillation with RVR with soft blood pressure. He was given 5mg  dose of metoprolol with conversion to sinus rhythm/junctional rhythm with subsequent  bradycardia. Cardiology  was consulted by ED physician and was admitted to the hospital for further evaluation and treatment.   Hospital Course:   Following conditions were addressed during hospitalization as listed below,  Paroxysmal atrial fibrillation with RVR (HCC) Presented with HR 150 and received 5mg  of metoprolol in the ED with subsequent conversion to sinus rhythm and junctional rhythm and subsequent bradycardia in 40s. CHA2DS2-VASc 4. Cardiology has advised against any further beta-blocker or antiarrhythmic medication. EP cardiology was consulted and at this time no recommendations for pacemaker placement.  Patient might need pacemaker in the future.  He will follow-up with Dr. Anne Fu cardiology as outpatient.   NSTEMI (non-ST elevated myocardial infarction) (HCC)  Troponin 2333 -->6440->34742.  Known history of CAD with multiple stents.  Cardiology was consulted and patient underwent cardiac catheterization on 08/23/2022 with findings of severe two-vessel obstructive CAD with 90% proximal LAD stenosis and tandem stenosis of 99% in proximal and mid RCA.  PCI of the proximal LAD was performed but unable to perform PCI of the RCA.  Cardiology has recommended DAPT with aspirin for 4 weeks followed by Plavix for 12 months.   DC lisionopril as per card   Ascending aortic aneurysm (HCC) -Follows with CT surgery Dr. Orson Aloe.. Recent CT showing stable aneurysm at 5cm   Peripheral vascular disease (HCC) -continue aspirin and statin.  Stop aspirin in 1 month and to continue Plavix after that.   Trifascicular block -hx of first degree AV block, RBBB and LAFB.  Electrophysiology has considered the patient okay for discharge at this time.  Might need pacemaker in the future.  Type 2 diabetes mellitus with vascular disease (HCC) Latest HbA1C 6.7.  Will resume home regimen on discharge.  Hypothyroidism low TSH and high T4. Dose of synthroid decreased from to with follow up labs  with primary in 4 to 6 weeks.  Disposition.  At this time, patient is stable for disposition home with outpatient PCP and cardiology follow-up.  Medical Consultants:   Cardiology Electrophysiology cardiology  Procedures:    Cardiac catheterization on 08/23/2022 with placement of drug-eluting stent. Subjective:   Today, patient was seen and examined at bedside.  Denies any chest pain, dizziness, lightheadedness, shortness of breath.  Seen by cardiology and okay for discharge home today.  Discharge Exam:   Vitals:   08/24/22 0455 08/24/22 0743  BP:  (!) 107/52  Pulse: (!) 48   Resp:  17  Temp:  98.1 F (36.7 C)  SpO2: 99%    Vitals:   08/24/22 0445 08/24/22 0450 08/24/22 0455 08/24/22 0743  BP: 119/67   (!) 107/52  Pulse: 67 (!) 56 (!) 48   Resp: 18   17  Temp: 97.9 F (36.6 C)   98.1 F (36.7 C)  TempSrc: Oral   Axillary  SpO2: 99% 99% 99%   Weight:      Height:       Body mass index is 24.07 kg/m.   General: Alert awake, not in obvious distress HENT: pupils equally reacting to light,  No scleral pallor or icterus noted. Oral mucosa is moist.  Chest:  Clear breath sounds.  Diminished breath sounds bilaterally. No crackles or wheezes.  CVS: S1 &S2 heard. No murmur.  Regular rate and rhythm. Abdomen: Soft, nontender, nondistended.  Bowel sounds are heard.   Extremities: Right below-knee amputation with prosthesis.   Psych: Alert, awake and oriented, normal mood CNS:  No cranial nerve deficits.  Power equal in all extremities.   Skin: Warm and dry.  No rashes noted.  The results of significant diagnostics from this hospitalization (including imaging, microbiology, ancillary and laboratory) are listed below for reference.     Diagnostic Studies:   CARDIAC CATHETERIZATION  Result Date: 08/23/2022   Prox LAD-1 lesion is 90% stenosed.   Prox RCA-1 lesion is 99% stenosed.   Prox RCA-2 lesion is 99% stenosed.   Previously placed Prox LAD-2 stent of unknown type is   widely patent.   Previously placed Ramus stent of unknown type is  widely patent.   Previously placed Dist RCA stent of unknown type is  widely patent.   A drug-eluting stent was successfully placed using a SYNERGY XD [id number].   Post intervention, there is a 0% residual stenosis.   Post intervention, there is a 0% residual stenosis.   Post intervention, there is a 99% residual stenosis.   Post intervention, there is a 99% residual stenosis.   LV end diastolic pressure is normal.   Recommend to resume Apixaban, at currently prescribed dose and frequency on 08/24/2022.   Recommend concurrent antiplatelet therapy of Aspirin 81 mg for 1 month and Clopidogrel 75mg  daily for 12 months . Severe 2 vessel obstructive CAD. 90% proximal LAD stenosis just proximal to and in the very proximal portion of the prior stent. Tandem 99% stenoses in the proximal and mid RCA. Distal RCA and ramus intermediate stents are patent Normal LVEDP Successful PCI of the proximal LAD with DES x 1 overlapping with prior stent Unsuccessful PCI of the RCA due to inability to cross the second lesion with a wire. Plan:  DAPT with ASA for 1-4 weeks, plavix for 12 months. OK to start anticoagulation with Eliquis in am. Further management of arrhythmia per rounding team. If needed I think the RCA could be addressed at a later date using a CTO approach.   ECHOCARDIOGRAM COMPLETE  Result Date: 08/23/2022    ECHOCARDIOGRAM REPORT   Patient Name:   [name] [facility] Date of Exam: 08/23/2022 Medical Rec #:  [id number]   Height:       80.0 in Accession #:    [id number]  Weight:       223.1 lb Date of Birth:  1950-01-31    BSA:          2.409 m Patient Age:    72 years    BP:           129/58 mmHg Patient Gender: M           HR:           58 bpm. Exam Location:  Inpatient Procedure: 2D Echo, Cardiac Doppler and Color Doppler Indications:    NSTEMI  History:        Patient has prior history of Echocardiogram examinations, most                 recent 02/02/2022. CHF,  Previous Myocardial Infarction and CAD,                 PAD, Arrythmias:Bradycardia, Atrial Fibrillation and AV block,                 Signs/Symptoms:Dizziness/Lightheadedness and Fatigue; Risk                 Factors:Diabetes, Dyslipidemia and Sleep Apnea. AAA.  Sonographer:    Wallie Char Referring Phys: [id number] NATHAN P GOODWIN  Sonographer Comments: Technically difficult study due to poor echo windows. IMPRESSIONS  1. Left ventricular ejection fraction, by estimation, is 60 to 65%. The left ventricle has normal function. The left ventricle has no regional wall motion abnormalities. Left ventricular diastolic parameters are consistent with Grade I diastolic dysfunction (impaired relaxation).  2. Right ventricular systolic function is normal. The right ventricular size is mildly enlarged.  3. The mitral valve is normal in structure. No evidence of mitral valve regurgitation. No evidence of mitral stenosis.  4. The aortic valve is calcified. There is mild calcification of the aortic valve. There is mild thickening of the aortic valve. Aortic valve regurgitation is trivial. Aortic valve sclerosis is present, with no evidence of aortic valve stenosis.  5. Aortic dilatation noted. There is mild dilatation of the aortic root, measuring 41 mm. There is mild dilatation of the ascending aorta, measuring 44 mm.  6. The inferior vena cava is normal in size with greater than 50% respiratory variability, suggesting right atrial pressure of 3 mmHg. FINDINGS  Left Ventricle: Left ventricular ejection fraction, by estimation, is 60 to 65%. The left ventricle has normal function. The left ventricle has no regional wall motion abnormalities. The left ventricular internal cavity size was normal in size. There is  no left ventricular hypertrophy. Left ventricular diastolic parameters are consistent with Grade I diastolic dysfunction (impaired relaxation). Right Ventricle: The right ventricular size is mildly enlarged. No increase  in right ventricular wall thickness. Right ventricular systolic function is normal. Left Atrium: Left atrial size was normal in size. Right Atrium: Right atrial size was normal in size. Pericardium: There is no evidence of pericardial effusion. Mitral Valve: The mitral valve is normal in structure.  Mild mitral annular calcification. No evidence of mitral valve regurgitation. No evidence of mitral valve stenosis. MV peak gradient, 2.2 mmHg. The mean mitral valve gradient is 1.0 mmHg. Tricuspid Valve: The tricuspid valve is normal in structure. Tricuspid valve regurgitation is not demonstrated. No evidence of tricuspid stenosis. Aortic Valve: The aortic valve is calcified. There is mild calcification of the aortic valve. There is mild thickening of the aortic valve. Aortic valve regurgitation is trivial. Aortic valve sclerosis is present, with no evidence of aortic valve stenosis. Aortic valve mean gradient measures 6.0 mmHg. Aortic valve peak gradient measures 11.6 mmHg. Aortic valve area, by VTI measures 3.15 cm. Pulmonic Valve: The pulmonic valve was normal in structure. Pulmonic valve regurgitation is not visualized. No evidence of pulmonic stenosis. Aorta: Aortic dilatation noted. There is mild dilatation of the aortic root, measuring 41 mm. There is mild dilatation of the ascending aorta, measuring 44 mm. Venous: The inferior vena cava is normal in size with greater than 50% respiratory variability, suggesting right atrial pressure of 3 mmHg. IAS/Shunts: No atrial level shunt detected by color flow Doppler.  LEFT VENTRICLE PLAX 2D LVIDd:         4.50 cm      Diastology LVIDs:         3.10 cm      LV e' medial:    6.34 cm/s LV PW:         1.10 cm      LV E/e' medial:  11.9 LV IVS:        1.10 cm      LV e' lateral:   9.52 cm/s LVOT diam:     1.90 cm      LV E/e' lateral: 7.9 LV SV:         135 LV SV Index:   56 LVOT Area:     2.84 cm  LV Volumes (MOD) LV vol d, MOD A2C: 110.0 ml LV vol d, MOD A4C: 168.0 ml LV  vol s, MOD A2C: 39.2 ml LV vol s, MOD A4C: 62.9 ml LV SV MOD A2C:     70.8 ml LV SV MOD A4C:     168.0 ml LV SV MOD BP:      89.7 ml RIGHT VENTRICLE RV Basal diam:  4.70 cm RV S prime:     13.30 cm/s TAPSE (M-mode): 2.7 cm LEFT ATRIUM             Index        RIGHT ATRIUM           Index LA diam:        5.50 cm 2.28 cm/m   RA Area:     17.90 cm LA Vol (A2C):   56.4 ml 23.41 ml/m  RA Volume:   46.30 ml  19.22 ml/m LA Vol (A4C):   87.7 ml 36.41 ml/m LA Biplane Vol: 72.6 ml 30.14 ml/m  AORTIC VALVE AV Area (Vmax):    3.12 cm AV Area (Vmean):   3.03 cm AV Area (VTI):     3.15 cm AV Vmax:           170.50 cm/s AV Vmean:          117.500 cm/s AV VTI:            0.428 m AV Peak Grad:      11.6 mmHg AV Mean Grad:      6.0 mmHg LVOT Vmax:         187.50  cm/s LVOT Vmean:        125.500 cm/s LVOT VTI:          0.475 m LVOT/AV VTI ratio: 1.11  AORTA Ao Root diam: 4.10 cm Ao Asc diam:  4.40 cm MITRAL VALVE               TRICUSPID VALVE MV Area (PHT): 1.70 cm    TR Peak grad:   27.9 mmHg MV Area VTI:   3.40 cm    TR Vmax:        264.00 cm/s MV Peak grad:  2.2 mmHg MV Mean grad:  1.0 mmHg    SHUNTS MV Vmax:       0.75 m/s    Systemic VTI:  0.48 m MV Vmean:      43.9 cm/s   Systemic Diam: 1.90 cm MV Decel Time: 446 msec MR Peak grad: 126.3 mmHg MR Mean grad: 86.0 mmHg MR Vmax:      562.00 cm/s MR Vmean:     441.0 cm/s MV E velocity: 75.40 cm/s MV A velocity: 71.00 cm/s MV E/A ratio:  1.06 Donato Schultz MD Electronically signed by Donato Schultz MD Signature Date/Time: 08/23/2022/11:42:49 AM    Final    DG Chest Port 1 View  Result Date: 08/22/2022 CLINICAL DATA:  Atrial fibrillation. EXAM: PORTABLE CHEST 1 VIEW COMPARISON:  X-ray 03/27/2020.  CT angiogram earlier 08/22/2022 FINDINGS: Normal cardiopericardial silhouette. Calcified tortuous aorta. No consolidation, pneumothorax or effusion. No edema. Overlapping cardiac leads. Please see separate dictation of CT of the chest from same day IMPRESSION: No acute cardiopulmonary  disease. Electronically Signed   By: Karen Kays M.D.   On: 08/22/2022 16:38   CT ANGIO CHEST AORTA W/CM & OR WO/CM  Result Date: 08/22/2022 CLINICAL DATA:  Thoracic aortic aneurysm. EXAM: CT ANGIOGRAPHY CHEST WITH CONTRAST TECHNIQUE: Multidetector CT imaging of the chest was performed using the standard protocol during bolus administration of intravenous contrast. Multiplanar CT image reconstructions and MIPs were obtained to evaluate the vascular anatomy. RADIATION DOSE REDUCTION: This exam was performed according to the departmental dose-optimization program which includes automated exposure control, adjustment of the mA and/or kV according to patient size and/or use of iterative reconstruction technique. CONTRAST:  75mL ISOVUE-370 IOPAMIDOL (ISOVUE-370) INJECTION 76% COMPARISON:  December 30, 2021. FINDINGS: Cardiovascular: Grossly stable 5 cm ascending thoracic aortic aneurysm is noted. No dissection is noted. Great vessels are widely patent without stenosis. Normal cardiac size. Coronary calcifications are noted. Mediastinum/Nodes: Postsurgical changes seen involving distal esophagus. No adenopathy is noted. No definite thyroid abnormality is noted. Lungs/Pleura: No pneumothorax or pleural effusion is noted. Minimal bibasilar subsegmental atelectasis or scarring is noted. Stable 5 mm nodule is noted in right middle lobe best seen on image number 105 of series 11. Upper Abdomen: Cholelithiasis.  Status post gastric bypass. Musculoskeletal: No chest wall abnormality. No acute or significant osseous findings. Review of the MIP images confirms the above findings. IMPRESSION: Grossly stable 5 cm Ascending thoracic aortic aneurysm. Recommend semi-annual imaging followup by CTA or MRA and referral to cardiothoracic surgery if not already obtained. This recommendation follows 2010 ACCF/AHA/AATS/ACR/ASA/SCA/SCAI/SIR/STS/SVM Guidelines for the Diagnosis and Management of Patients With Thoracic Aortic Disease.  Circulation. 2010; 121: Z610-R604. Aortic aneurysm NOS (ICD10-I71.9). Stable 5 mm nodule is noted in right middle lobe. Attention to this on follow-up imaging is recommended. Coronary artery calcifications are noted suggesting coronary artery disease. Cholelithiasis. Status post gastric bypass. Aortic Atherosclerosis (ICD10-I70.0). Electronically Signed   By: Lupita Raider  M.D.   On: 08/22/2022 13:52     Labs:   Basic Metabolic Panel: Recent Labs  Lab 08/22/22 1557 08/23/22 0349 08/24/22 0058  NA 138 138 135  K 3.6 3.9 3.1*  CL 106 103 105  CO2 21* 26 21*  GLUCOSE 148* 135* 100*  BUN 9 7* 7*  CREATININE 0.76 0.86 0.83  CALCIUM 8.9 8.6* 8.4*  MG 1.8  --   --    GFR Estimated Creatinine Clearance: 106 mL/min (by C-G formula based on SCr of 0.83 mg/dL). Liver Function Tests: Recent Labs  Lab 08/22/22 1557 08/23/22 1146 08/24/22 0058  AST 21 44* 32  ALT 14 20 17   ALKPHOS 49 49 46  BILITOT 0.6 1.0 0.5  PROT 6.7 6.3* 5.7*  ALBUMIN 4.1 3.4* 3.2*   No results for input(s): "LIPASE", "AMYLASE" in the last 168 hours. No results for input(s): "AMMONIA" in the last 168 hours. Coagulation profile No results for input(s): "INR", "PROTIME" in the last 168 hours.  CBC: Recent Labs  Lab 08/22/22 1557 08/23/22 0349 08/24/22 0058  WBC 9.5 5.3 6.4  HGB 14.8 12.3* 12.4*  HCT 43.8 38.0* 37.5*  MCV 90.3 93.8 90.4  PLT 236 163 187   Cardiac Enzymes: No results for input(s): "CKTOTAL", "CKMB", "CKMBINDEX", "TROPONINI" in the last 168 hours. BNP: Invalid input(s): "POCBNP" CBG: Recent Labs  Lab 08/23/22 2151  GLUCAP 107*   D-Dimer No results for input(s): "DDIMER" in the last 72 hours. Hgb A1c Recent Labs    08/24/22 0058  HGBA1C 6.7*   Lipid Profile Recent Labs    08/24/22 0058  CHOL 107  HDL 32*  LDLCALC 55  TRIG 98  CHOLHDL 3.3   Thyroid function studies Recent Labs    08/22/22 1557 08/22/22 1704  TSH 0.151*  --   T3FREE  --  2.5   Anemia work  up No results for input(s): "VITAMINB12", "FOLATE", "FERRITIN", "TIBC", "IRON", "RETICCTPCT" in the last 72 hours. Microbiology No results found for this or any previous visit (from the past 240 hour(s)).   Discharge Instructions:   Discharge Instructions     Amb Referral to Cardiac Rehabilitation   Complete by: As directed    Diagnosis:  Coronary Stents NSTEMI     After initial evaluation and assessments completed: Virtual Based Care may be provided alone or in conjunction with Phase 2 Cardiac Rehab based on patient barriers.: Yes   Intensive Cardiac Rehabilitation (ICR) [facility] location only OR Traditional Cardiac Rehabilitation (TCR) *If criteria for ICR are not met will enroll in TCR [facility] only): Yes   Amb referral to AFIB Clinic   Complete by: As directed    Diet - low sodium heart healthy   Complete by: As directed    Discharge instructions   Complete by: As directed    Follow-up with your primary care provider in 1 week.  Check blood work at that time.  Follow-up with cardiology as scheduled by the clinic.  Seek medical attention for worsening symptoms.  Take medications as prescribed.  Stop taking aspirin after 1 month.   Increase activity slowly   Complete by: As directed       Allergies as of 08/24/2022       Reactions   Other Other (See Comments)   Moxifloxacin Rash   Avelox Rash at injection site   Penicillins Rash   Has patient had a PCN reaction causing immediate rash, facial/tongue/throat swelling, SOB or lightheadedness with hypotension: #  #  #  YES  #  #  #  Has patient had a PCN reaction causing severe rash involving mucus membranes or skin necrosis: No Has patient had a PCN reaction that required hospitalization unknown Has patient had a PCN reaction occurring within the last 10 years: No If all of the above answers are "NO", then may proceed with Cephalosporin use.   Quinolones Itching   Sulfamethoxazole-trimethoprim Itching, Rash, Other (See Comments)         Medication List     STOP taking these medications    lisinopril 2.5 MG tablet Commonly known as: ZESTRIL   Omeprazole 20 MG Tbec       TAKE these medications    amoxicillin 500 MG tablet Commonly known as: AMOXIL Take 4 tablets by mouth 1 hour prior to dental procedure. What changed:  how much to take how to take this when to take this reasons to take this additional instructions   apixaban 5 MG Tabs tablet Commonly known as: ELIQUIS Take 1 tablet (5 mg total) by mouth 2 (two) times daily.   aspirin EC 81 MG tablet Take 1 tablet (81 mg total) by mouth daily. Then stop What changed: additional instructions Notes to patient: Take for 30 days then stop   atorvastatin 40 MG tablet Commonly known as: LIPITOR Take 1 tablet (40 mg total) by mouth at bedtime. What changed:  medication strength how much to take   CALCIUM CITRATE CHEWY BITE PO Take 500 mg by mouth 3 (three) times daily.   clopidogrel 75 MG tablet Commonly known as: PLAVIX Take 1 tablet (75 mg total) by mouth daily with breakfast. Start taking on: August 25, 2022   cyanocobalamin 1000 MCG tablet Commonly known as: VITAMIN B12 Take 1,000 mcg by mouth 3 (three) times a week. Mon, wed, Friday   ferrous sulfate 325 (65 FE) MG tablet Take 325 mg by mouth See admin instructions. Take one tablet by mouth on Monday, Wednesday and Fridays   fluticasone 50 MCG/ACT nasal spray Commonly known as: FLONASE Place 2 sprays into both nostrils daily as needed for allergies.   gabapentin 100 MG capsule Commonly known as: NEURONTIN TAKE 1 CAPSULE (100 MG TOTAL) BY MOUTH 4 (FOUR) TIMES DAILY. WHEN NECESSARY FOR NEUROPATHY PAIN What changed: additional instructions   GENTEAL SEVERE OP Place 1 drop into the right eye daily as needed (Dry eyes).   ketoconazole 2 % cream Commonly known as: NIZORAL Apply 1 Application topically daily as needed for rash.   levothyroxine 150 MCG tablet Commonly known as:  SYNTHROID Take 1 tablet (150 mcg total) by mouth daily before breakfast.   metFORMIN 500 MG 24 hr tablet Commonly known as: GLUCOPHAGE-XR Take 1,000 mg by mouth 2 (two) times daily. Notes to patient: Restart this medication on the morning of 8/3   multivitamin with minerals Tabs tablet Take 1 tablet by mouth in the morning and at bedtime. Gummies What changed: how much to take   nitroGLYCERIN 0.4 MG SL tablet Commonly known as: NITROSTAT Place 1 tablet (0.4 mg total) under the tongue every 5 (five) minutes x 3 doses as needed for chest pain.   Omega 3 1000 MG Caps Take 1,000 mg by mouth daily.   PreserVision AREDS 2 Caps Take 1 capsule by mouth daily.   Valerian Root 450 MG Caps Take 450 mg by mouth at bedtime.        Follow-up Information     Thana Ates, MD Follow up in 1 week(s).  Specialty: Internal Medicine Contact information: [address] [place] [address] [phone]         Jake Bathe, MD Follow up in 2 week(s).   Specialty: Cardiology Why: as scheduled by clinic Contact information: [address] [place] [address] [phone]                  Time coordinating discharge: 39 minutes  Signed:  Myrtha Tonkovich  [facility] 08/24/2022, 12:56 PM

## 2022-08-24 NOTE — Progress Notes (Addendum)
Rounding Note    Patient Name: [name] Date of Encounter: 08/24/2022  [facility] Cardiologist: Donato Schultz, MD   Subjective   Feeling well, hopeful for DC home this morning. Just seen by EP.   Inpatient Medications    Scheduled Meds:  apixaban  5 mg Oral BID   aspirin  81 mg Oral Daily   atorvastatin  40 mg Oral QHS   clopidogrel  75 mg Oral Q breakfast   cyanocobalamin  1,000 mcg Oral Q M,W,F   ferrous sulfate  325 mg Oral Q M,W,F   levothyroxine  125 mcg Oral Q0600   omega-3 acid ethyl esters  1,000 mg Oral Daily   sodium chloride flush  3 mL Intravenous Q12H   Continuous Infusions:  sodium chloride     sodium chloride     PRN Meds: sodium chloride, sodium chloride, acetaminophen, gabapentin, ondansetron (ZOFRAN) IV, sodium chloride flush   Vital Signs    Vitals:   08/24/22 0445 08/24/22 0450 08/24/22 0455 08/24/22 0743  BP: 119/67   (!) 107/52  Pulse: 67 (!) 56 (!) 48   Resp: 18   17  Temp: 97.9 F (36.6 C)   98.1 F (36.7 C)  TempSrc: Oral   Axillary  SpO2: 99% 99% 99%   Weight:      Height:        Intake/Output Summary (Last 24 hours) at 08/24/2022 0810 Last data filed at 08/24/2022 0500 Gross per 24 hour  Intake 899.29 ml  Output 1200 ml  Net -300.71 ml      08/23/2022    9:32 PM 08/23/2022    5:06 AM 08/23/2022   12:37 AM  Last 3 Weights  Weight (lbs) 219 lb 2.2 oz 223 lb 1.7 oz 222 lb 14.2 oz  Weight (kg) 99.4 kg 101.2 kg 101.1 kg      Telemetry    Sinus Bradycardia, rates in the upper 30s at times - Personally Reviewed  ECG    Sinus Bradycardia, 50bpm, 1st degree AVB, RBBB, LAFB - Personally Reviewed  Physical Exam   GEN: No acute distress.   Neck: No JVD Cardiac: RRR, no murmurs, rubs, or gallops.  Respiratory: Clear to auscultation bilaterally. GI: Soft, nontender, non-distended  MS: No edema, R BKA, Left radial cath site stable Neuro:  Nonfocal  Psych: Normal affect   Labs    High Sensitivity Troponin:    Recent Labs  Lab 08/22/22 1557 08/22/22 1751 08/22/22 1913 08/22/22 2055  TROPONINIHS 24* 2,333* 7,113* 11,237*     Chemistry Recent Labs  Lab 08/22/22 1557 08/23/22 0349 08/23/22 1146 08/24/22 0058  NA 138 138  --  135  K 3.6 3.9  --  3.1*  CL 106 103  --  105  CO2 21* 26  --  21*  GLUCOSE 148* 135*  --  100*  BUN 9 7*  --  7*  CREATININE 0.76 0.86  --  0.83  CALCIUM 8.9 8.6*  --  8.4*  MG 1.8  --   --   --   PROT 6.7  --  6.3* 5.7*  ALBUMIN 4.1  --  3.4* 3.2*  AST 21  --  44* 32  ALT 14  --  20 17  ALKPHOS 49  --  49 46  BILITOT 0.6  --  1.0 0.5  GFRNONAA >60 >60  --  >60  ANIONGAP 11 9  --  9    Lipids  Recent Labs  Lab  08/24/22 0058  CHOL 107  TRIG 98  HDL 32*  LDLCALC 55  CHOLHDL 3.3    Hematology Recent Labs  Lab 08/22/22 1557 08/23/22 0349 08/24/22 0058  WBC 9.5 5.3 6.4  RBC 4.85 4.05* 4.15*  HGB 14.8 12.3* 12.4*  HCT 43.8 38.0* 37.5*  MCV 90.3 93.8 90.4  MCH 30.5 30.4 29.9  MCHC 33.8 32.4 33.1  RDW 13.5 13.6 13.5  PLT 236 163 187   Thyroid  Recent Labs  Lab 08/22/22 1557 08/22/22 1704  TSH 0.151*  --   FREET4  --  1.72*    BNP Recent Labs  Lab 08/22/22 1557  BNP 280.8*    DDimer No results for input(s): "DDIMER" in the last 168 hours.   Radiology    CARDIAC CATHETERIZATION  Result Date: 08/23/2022   Prox LAD-1 lesion is 90% stenosed.   Prox RCA-1 lesion is 99% stenosed.   Prox RCA-2 lesion is 99% stenosed.   Previously placed Prox LAD-2 stent of unknown type is  widely patent.   Previously placed Ramus stent of unknown type is  widely patent.   Previously placed Dist RCA stent of unknown type is  widely patent.   A drug-eluting stent was successfully placed using a SYNERGY XD 3.50X12.   Post intervention, there is a 0% residual stenosis.   Post intervention, there is a 0% residual stenosis.   Post intervention, there is a 99% residual stenosis.   Post intervention, there is a 99% residual stenosis.   LV end diastolic pressure is  normal.   Recommend to resume Apixaban, at currently prescribed dose and frequency on 08/24/2022.   Recommend concurrent antiplatelet therapy of Aspirin 81 mg for 1 month and Clopidogrel 75mg  daily for 12 months . Severe 2 vessel obstructive CAD. 90% proximal LAD stenosis just proximal to and in the very proximal portion of the prior stent. Tandem 99% stenoses in the proximal and mid RCA. Distal RCA and ramus intermediate stents are patent Normal LVEDP Successful PCI of the proximal LAD with DES x 1 overlapping with prior stent Unsuccessful PCI of the RCA due to inability to cross the second lesion with a wire. Plan: DAPT with ASA for 1-4 weeks, plavix for 12 months. OK to start anticoagulation with Eliquis in am. Further management of arrhythmia per rounding team. If needed I think the RCA could be addressed at a later date using a CTO approach.   ECHOCARDIOGRAM COMPLETE  Result Date: 08/23/2022    ECHOCARDIOGRAM REPORT   Patient Name:   [name] [facility] Date of Exam: 08/23/2022 Medical Rec #:  [id number]   Height:       80.0 in Accession #:    [id number]  Weight:       223.1 lb Date of Birth:  1950/06/08    BSA:          2.409 m Patient Age:    72 years    BP:           129/58 mmHg Patient Gender: M           HR:           58 bpm. Exam Location:  Inpatient Procedure: 2D Echo, Cardiac Doppler and Color Doppler Indications:    NSTEMI  History:        Patient has prior history of Echocardiogram examinations, most                 recent 02/02/2022. CHF, Previous Myocardial Infarction and CAD,  PAD, Arrythmias:Bradycardia, Atrial Fibrillation and AV block,                 Signs/Symptoms:Dizziness/Lightheadedness and Fatigue; Risk                 Factors:Diabetes, Dyslipidemia and Sleep Apnea. AAA.  Sonographer:    Wallie Char Referring Phys: [id number] NATHAN P GOODWIN  Sonographer Comments: Technically difficult study due to poor echo windows. IMPRESSIONS  1. Left ventricular ejection fraction, by estimation,  is 60 to 65%. The left ventricle has normal function. The left ventricle has no regional wall motion abnormalities. Left ventricular diastolic parameters are consistent with Grade I diastolic dysfunction (impaired relaxation).  2. Right ventricular systolic function is normal. The right ventricular size is mildly enlarged.  3. The mitral valve is normal in structure. No evidence of mitral valve regurgitation. No evidence of mitral stenosis.  4. The aortic valve is calcified. There is mild calcification of the aortic valve. There is mild thickening of the aortic valve. Aortic valve regurgitation is trivial. Aortic valve sclerosis is present, with no evidence of aortic valve stenosis.  5. Aortic dilatation noted. There is mild dilatation of the aortic root, measuring 41 mm. There is mild dilatation of the ascending aorta, measuring 44 mm.  6. The inferior vena cava is normal in size with greater than 50% respiratory variability, suggesting right atrial pressure of 3 mmHg. FINDINGS  Left Ventricle: Left ventricular ejection fraction, by estimation, is 60 to 65%. The left ventricle has normal function. The left ventricle has no regional wall motion abnormalities. The left ventricular internal cavity size was normal in size. There is  no left ventricular hypertrophy. Left ventricular diastolic parameters are consistent with Grade I diastolic dysfunction (impaired relaxation). Right Ventricle: The right ventricular size is mildly enlarged. No increase in right ventricular wall thickness. Right ventricular systolic function is normal. Left Atrium: Left atrial size was normal in size. Right Atrium: Right atrial size was normal in size. Pericardium: There is no evidence of pericardial effusion. Mitral Valve: The mitral valve is normal in structure. Mild mitral annular calcification. No evidence of mitral valve regurgitation. No evidence of mitral valve stenosis. MV peak gradient, 2.2 mmHg. The mean mitral valve gradient is  1.0 mmHg. Tricuspid Valve: The tricuspid valve is normal in structure. Tricuspid valve regurgitation is not demonstrated. No evidence of tricuspid stenosis. Aortic Valve: The aortic valve is calcified. There is mild calcification of the aortic valve. There is mild thickening of the aortic valve. Aortic valve regurgitation is trivial. Aortic valve sclerosis is present, with no evidence of aortic valve stenosis. Aortic valve mean gradient measures 6.0 mmHg. Aortic valve peak gradient measures 11.6 mmHg. Aortic valve area, by VTI measures 3.15 cm. Pulmonic Valve: The pulmonic valve was normal in structure. Pulmonic valve regurgitation is not visualized. No evidence of pulmonic stenosis. Aorta: Aortic dilatation noted. There is mild dilatation of the aortic root, measuring 41 mm. There is mild dilatation of the ascending aorta, measuring 44 mm. Venous: The inferior vena cava is normal in size with greater than 50% respiratory variability, suggesting right atrial pressure of 3 mmHg. IAS/Shunts: No atrial level shunt detected by color flow Doppler.  LEFT VENTRICLE PLAX 2D LVIDd:         4.50 cm      Diastology LVIDs:         3.10 cm      LV e' medial:    6.34 cm/s LV PW:  1.10 cm      LV E/e' medial:  11.9 LV IVS:        1.10 cm      LV e' lateral:   9.52 cm/s LVOT diam:     1.90 cm      LV E/e' lateral: 7.9 LV SV:         135 LV SV Index:   56 LVOT Area:     2.84 cm  LV Volumes (MOD) LV vol d, MOD A2C: 110.0 ml LV vol d, MOD A4C: 168.0 ml LV vol s, MOD A2C: 39.2 ml LV vol s, MOD A4C: 62.9 ml LV SV MOD A2C:     70.8 ml LV SV MOD A4C:     168.0 ml LV SV MOD BP:      89.7 ml RIGHT VENTRICLE RV Basal diam:  4.70 cm RV S prime:     13.30 cm/s TAPSE (M-mode): 2.7 cm LEFT ATRIUM             Index        RIGHT ATRIUM           Index LA diam:        5.50 cm 2.28 cm/m   RA Area:     17.90 cm LA Vol (A2C):   56.4 ml 23.41 ml/m  RA Volume:   46.30 ml  19.22 ml/m LA Vol (A4C):   87.7 ml 36.41 ml/m LA Biplane Vol: 72.6  ml 30.14 ml/m  AORTIC VALVE AV Area (Vmax):    3.12 cm AV Area (Vmean):   3.03 cm AV Area (VTI):     3.15 cm AV Vmax:           170.50 cm/s AV Vmean:          117.500 cm/s AV VTI:            0.428 m AV Peak Grad:      11.6 mmHg AV Mean Grad:      6.0 mmHg LVOT Vmax:         187.50 cm/s LVOT Vmean:        125.500 cm/s LVOT VTI:          0.475 m LVOT/AV VTI ratio: 1.11  AORTA Ao Root diam: 4.10 cm Ao Asc diam:  4.40 cm MITRAL VALVE               TRICUSPID VALVE MV Area (PHT): 1.70 cm    TR Peak grad:   27.9 mmHg MV Area VTI:   3.40 cm    TR Vmax:        264.00 cm/s MV Peak grad:  2.2 mmHg MV Mean grad:  1.0 mmHg    SHUNTS MV Vmax:       0.75 m/s    Systemic VTI:  0.48 m MV Vmean:      43.9 cm/s   Systemic Diam: 1.90 cm MV Decel Time: 446 msec MR Peak grad: 126.3 mmHg MR Mean grad: 86.0 mmHg MR Vmax:      562.00 cm/s MR Vmean:     441.0 cm/s MV E velocity: 75.40 cm/s MV A velocity: 71.00 cm/s MV E/A ratio:  1.06 Donato Schultz MD Electronically signed by Donato Schultz MD Signature Date/Time: 08/23/2022/11:42:49 AM    Final    DG Chest Port 1 View  Result Date: 08/22/2022 CLINICAL DATA:  Atrial fibrillation. EXAM: PORTABLE CHEST 1 VIEW COMPARISON:  X-ray 03/27/2020.  CT angiogram earlier 08/22/2022 FINDINGS: Normal cardiopericardial silhouette. Calcified  tortuous aorta. No consolidation, pneumothorax or effusion. No edema. Overlapping cardiac leads. Please see separate dictation of CT of the chest from same day IMPRESSION: No acute cardiopulmonary disease. Electronically Signed   By: Karen Kays M.D.   On: 08/22/2022 16:38   CT ANGIO CHEST AORTA W/CM & OR WO/CM  Result Date: 08/22/2022 CLINICAL DATA:  Thoracic aortic aneurysm. EXAM: CT ANGIOGRAPHY CHEST WITH CONTRAST TECHNIQUE: Multidetector CT imaging of the chest was performed using the standard protocol during bolus administration of intravenous contrast. Multiplanar CT image reconstructions and MIPs were obtained to evaluate the vascular anatomy. RADIATION  DOSE REDUCTION: This exam was performed according to the departmental dose-optimization program which includes automated exposure control, adjustment of the mA and/or kV according to patient size and/or use of iterative reconstruction technique. CONTRAST:  75mL ISOVUE-370 IOPAMIDOL (ISOVUE-370) INJECTION 76% COMPARISON:  December 30, 2021. FINDINGS: Cardiovascular: Grossly stable 5 cm ascending thoracic aortic aneurysm is noted. No dissection is noted. Great vessels are widely patent without stenosis. Normal cardiac size. Coronary calcifications are noted. Mediastinum/Nodes: Postsurgical changes seen involving distal esophagus. No adenopathy is noted. No definite thyroid abnormality is noted. Lungs/Pleura: No pneumothorax or pleural effusion is noted. Minimal bibasilar subsegmental atelectasis or scarring is noted. Stable 5 mm nodule is noted in right middle lobe best seen on image number 105 of series 11. Upper Abdomen: Cholelithiasis.  Status post gastric bypass. Musculoskeletal: No chest wall abnormality. No acute or significant osseous findings. Review of the MIP images confirms the above findings. IMPRESSION: Grossly stable 5 cm Ascending thoracic aortic aneurysm. Recommend semi-annual imaging followup by CTA or MRA and referral to cardiothoracic surgery if not already obtained. This recommendation follows 2010 ACCF/AHA/AATS/ACR/ASA/SCA/SCAI/SIR/STS/SVM Guidelines for the Diagnosis and Management of Patients With Thoracic Aortic Disease. Circulation. 2010; 121: Z308-M578. Aortic aneurysm NOS (ICD10-I71.9). Stable 5 mm nodule is noted in right middle lobe. Attention to this on follow-up imaging is recommended. Coronary artery calcifications are noted suggesting coronary artery disease. Cholelithiasis. Status post gastric bypass. Aortic Atherosclerosis (ICD10-I70.0). Electronically Signed   By: Lupita Raider M.D.   On: 08/22/2022 13:52    Cardiac Studies   Cath: 08/23/2022    Prox LAD-1 lesion is 90%  stenosed.   Prox RCA-1 lesion is 99% stenosed.   Prox RCA-2 lesion is 99% stenosed.   Previously placed Prox LAD-2 stent of unknown type is  widely patent.   Previously placed Ramus stent of unknown type is  widely patent.   Previously placed Dist RCA stent of unknown type is  widely patent.   A drug-eluting stent was successfully placed using a SYNERGY XD 3.50X12.   Post intervention, there is a 0% residual stenosis.   Post intervention, there is a 0% residual stenosis.   Post intervention, there is a 99% residual stenosis.   Post intervention, there is a 99% residual stenosis.   LV end diastolic pressure is normal.   Recommend to resume Apixaban, at currently prescribed dose and frequency on 08/24/2022.   Recommend concurrent antiplatelet therapy of Aspirin 81 mg for 1 month and Clopidogrel 75mg  daily for 12 months .   Severe 2 vessel obstructive CAD. 90% proximal LAD stenosis just proximal to and in the very proximal portion of the prior stent. Tandem 99% stenoses in the proximal and mid RCA. Distal RCA and ramus intermediate stents are patent Normal LVEDP Successful PCI of the proximal LAD with DES x 1 overlapping with prior stent Unsuccessful PCI of the RCA due to inability to cross the  second lesion with a wire.   Plan: DAPT with ASA for 1-4 weeks, plavix for 12 months. OK to start anticoagulation with Eliquis in am. Further management of arrhythmia per rounding team. If needed I think the RCA could be addressed at a later date using a CTO approach.   Diagnostic Dominance: Right  Intervention     Echo: 08/23/2022  IMPRESSIONS     1. Left ventricular ejection fraction, by estimation, is 60 to 65%. The  left ventricle has normal function. The left ventricle has no regional  wall motion abnormalities. Left ventricular diastolic parameters are  consistent with Grade I diastolic  dysfunction (impaired relaxation).   2. Right ventricular systolic function is normal. The right  ventricular  size is mildly enlarged.   3. The mitral valve is normal in structure. No evidence of mitral valve  regurgitation. No evidence of mitral stenosis.   4. The aortic valve is calcified. There is mild calcification of the  aortic valve. There is mild thickening of the aortic valve. Aortic valve  regurgitation is trivial. Aortic valve sclerosis is present, with no  evidence of aortic valve stenosis.   5. Aortic dilatation noted. There is mild dilatation of the aortic root,  measuring 41 mm. There is mild dilatation of the ascending aorta,  measuring 44 mm.   6. The inferior vena cava is normal in size with greater than 50%  respiratory variability, suggesting right atrial pressure of 3 mmHg.   FINDINGS   Left Ventricle: Left ventricular ejection fraction, by estimation, is 60  to 65%. The left ventricle has normal function. The left ventricle has no  regional wall motion abnormalities. The left ventricular internal cavity  size was normal in size. There is   no left ventricular hypertrophy. Left ventricular diastolic parameters  are consistent with Grade I diastolic dysfunction (impaired relaxation).   Right Ventricle: The right ventricular size is mildly enlarged. No  increase in right ventricular wall thickness. Right ventricular systolic  function is normal.   Left Atrium: Left atrial size was normal in size.   Right Atrium: Right atrial size was normal in size.   Pericardium: There is no evidence of pericardial effusion.   Mitral Valve: The mitral valve is normal in structure. Mild mitral annular  calcification. No evidence of mitral valve regurgitation. No evidence of  mitral valve stenosis. MV peak gradient, 2.2 mmHg. The mean mitral valve  gradient is 1.0 mmHg.   Tricuspid Valve: The tricuspid valve is normal in structure. Tricuspid  valve regurgitation is not demonstrated. No evidence of tricuspid  stenosis.   Aortic Valve: The aortic valve is calcified. There  is mild calcification  of the aortic valve. There is mild thickening of the aortic valve. Aortic  valve regurgitation is trivial. Aortic valve sclerosis is present, with no  evidence of aortic valve  stenosis. Aortic valve mean gradient measures 6.0 mmHg. Aortic valve peak  gradient measures 11.6 mmHg. Aortic valve area, by VTI measures 3.15 cm.   Pulmonic Valve: The pulmonic valve was normal in structure. Pulmonic valve  regurgitation is not visualized. No evidence of pulmonic stenosis.   Aorta: Aortic dilatation noted. There is mild dilatation of the aortic  root, measuring 41 mm. There is mild dilatation of the ascending aorta,  measuring 44 mm.   Venous: The inferior vena cava is normal in size with greater than 50%  respiratory variability, suggesting right atrial pressure of 3 mmHg.   IAS/Shunts: No atrial level shunt detected by  color flow Doppler.   Patient Profile     72 y.o. male  with CAD (PCI 2006 ramus and LAD with diffused RCA disease), known bradycardia/trifascicular block not on BB due to this, mild AI, 5cm ascending aortic aneurysm (stable by CT 07/2022), 5mm pulm nodule by CT to be followed in future, HTN, HLD, DM, OSA, gastric bypass, Charcot foot s/p remote R BKA. Had recent colonoscopy 7/29 requiring double prep. Then on 7/30 went to get CT to follow TAA. Following contrast administration felt sensation of uneasiness, lightheadedness, found to have elevated HR so sent to ED. Found to be in AF RVR, possibly swinging hyperthyroid by labs, with NSTEMI troponin rising to 11k.   Assessment & Plan    NSTEMI -- hsTn peaked at [address]37, underwent cardiac cath 7/31 noted above with successful PCI/DES x1 to pLAD overlapping prior stent, attempted intervention of the RCA was unsuccessful 2/2 inability to cross lesion with wire. Plan for medical therapy with consideration of CTO PCI at a later date. Echo showed LVEF of 60-65%, no rWMA, g1DD. Cardiac rehab to see -- continue  ASA/plavix/eliquis x1 month, then drop ASA with continuation of plavix and Eliquis -- continue atorvastatin 40mg  daily  New onset atrial fibrillation with RVR Junctional bradycardia/Trifascicular block -- found to be on Afib RVR on admission, converted spontaneously to junctional rhythm then sinus bradycardia -- has been transitioned from IV heparin to Eliquis 5mg  BID -- avoiding AVN blocking agents for now -- EP consult pending  Hypothyroidism -- TSH 0.15 with elevated free T4 -- synthroid adjusted per [facility] team  Hypokalemia -- K+ 3.1, supplement  Ascending aortic aneurysm -- 44mm ascending TAA followed by [facility], Dr. Dorris Fetch, on echo this admission  Will arrange for outpatient follow up in the office.  For questions or updates, please contact [facility] Please consult [email or web address] for contact info under   Signed, Laverda Page, NP  08/24/2022, 8:10 AM    Patient seen, examined. Available data reviewed. Agree with findings, assessment, and plan as outlined by Laverda Page, NP.  The patient is independently interviewed and examined.  He is alert, oriented, in no distress.  HEENT is normal, JVP is normal, lungs are clear bilaterally, heart is bradycardic and regular with a 2/6 systolic murmur at the right upper sternal border, abdomen is soft and nontender, left radial cath site is clear, lower extremities with no edema on the left and prosthetic on the right.  Review of telemetry shows sinus bradycardia in the 40s.  Appreciate EP evaluation.  No plans for permanent pacing at this time.  I agree with medication recommendations as above.  I personally reviewed the patient's cardiac catheterization films from his procedure yesterday.  He underwent successful PCI of the LAD with stenting and unsuccessful PCI of the RCA which is a chronic occlusion and collateralized from the left coronary artery.  Agree with recommendations regarding antiplatelet and anticoagulation  medicines, except that I would discontinue aspirin after 2 weeks.  He will be maintained on Eliquis and clopidogrel.  If he has recurrent angina or other ischemic symptoms, he could be referred back to Dr. [place] for consideration of CTO-PCI.  Tonny Bollman, M.D. 08/24/2022 10:27 AM

## 2022-08-24 NOTE — Discharge Instructions (Addendum)

## 2022-08-28 ENCOUNTER — Ambulatory Visit: Payer: Medicare Other | Admitting: Orthopedic Surgery

## 2022-08-28 ENCOUNTER — Encounter: Payer: Self-pay | Admitting: Orthopedic Surgery

## 2022-08-28 DIAGNOSIS — S88111A Complete traumatic amputation at level between knee and ankle, right lower leg, initial encounter: Secondary | ICD-10-CM

## 2022-08-28 DIAGNOSIS — Z89432 Acquired absence of left foot: Secondary | ICD-10-CM | POA: Diagnosis not present

## 2022-08-28 DIAGNOSIS — L97521 Non-pressure chronic ulcer of other part of left foot limited to breakdown of skin: Secondary | ICD-10-CM | POA: Diagnosis not present

## 2022-08-28 DIAGNOSIS — Z89511 Acquired absence of right leg below knee: Secondary | ICD-10-CM

## 2022-08-28 NOTE — Progress Notes (Signed)
Office Visit Note   Patient: [name]           Date of Birth: 1951/01/16           MRN: [id number] Visit Date: 08/28/2022              Requested by: Thana Ates, MD [address] [place],  [place] [address] PCP: Thana Ates, MD  Chief Complaint  Patient presents with   Left Foot - Follow-up      HPI: Patient is a 72 year old gentleman status post right below-knee amputation and left transmetatarsal amputation with ulceration plantar aspect of the left foot Wagner grade 1.  Patient states he feels well has no concerns.  Assessment & Plan: Visit Diagnoses:  1. Non-pressure chronic ulcer of other part of left foot limited to breakdown of skin (HCC)   2. Below-knee amputation of right lower extremity, initial encounter (HCC)   3. History of transmetatarsal amputation of left foot (HCC)     Plan: Ulcer was debrided there was healthy granulation tissue.  Continue with pressure offloading and routine wound care.  Follow-Up Instructions: Return in about 4 weeks (around 09/25/2022).   Ortho Exam  Patient is alert, oriented, no adenopathy, well-dressed, normal affect, normal respiratory effort. Examination patient's foot is plantigrade on the left.  There is an ulcer beneath the third metatarsal head.  After informed consent a 10 blade knife was used to debride the skin and soft tissue back to healthy viable tissue.  Silver nitrate was used hemostasis.  After debridement the ulcer is 2 cm in diameter and 2 mm deep.  The wound is flat no exposed bone or tendon no tunneling.  Imaging: No results found. No images are attached to the encounter.  Labs: Lab Results  Component Value Date   HGBA1C 6.7 (H) 08/24/2022   HGBA1C 6.8 (H) 03/28/2020   HGBA1C 6.5 (H) 09/26/2015   REPTSTATUS 08/01/2016 FINAL 07/30/2016   GRAMSTAIN  06/04/2014    MODERATE WBC PRESENT, PREDOMINANTLY PMN NO SQUAMOUS EPITHELIAL CELLS SEEN MODERATE GRAM POSITIVE COCCI IN CLUSTERS Performed at  [facility]    GRAMSTAIN  06/04/2014    MODERATE WBC PRESENT, PREDOMINANTLY PMN NO SQUAMOUS EPITHELIAL CELLS SEEN MODERATE GRAM POSITIVE COCCI IN PAIRS Performed at [facility]    CULT  07/30/2016    NO GROWTH Performed at [facility], [address] [place], [place] [address]      Lab Results  Component Value Date   ALBUMIN 3.2 (L) 08/24/2022   ALBUMIN 3.4 (L) 08/23/2022   ALBUMIN 4.1 08/22/2022    Lab Results  Component Value Date   MG 1.8 08/22/2022   MG 1.8 08/08/2021   MG 2.0 04/02/2020   Lab Results  Component Value Date   VD25OH 53.03 03/28/2020    No results found for: "PREALBUMIN"    Latest Ref Rng & Units 08/24/2022   12:58 AM 08/23/2022    3:49 AM 08/22/2022    3:57 PM  CBC EXTENDED  WBC 4.0 - 10.5 K/uL 6.4  5.3  9.5   RBC 4.22 - 5.81 MIL/uL 4.15  4.05  4.85   Hemoglobin 13.0 - 17.0 g/dL 95.2  84.1  32.4   HCT 39.0 - 52.0 % 37.5  38.0  43.8   Platelets 150 - 400 K/uL 187  163  236      There is no height or weight on file to calculate BMI.  Orders:  No orders of the defined types were placed in this encounter.  No orders of the defined types were placed in this encounter.    Procedures: No procedures performed  Clinical Data: No additional findings.  ROS:  All other systems negative, except as noted in the HPI. Review of Systems  Objective: Vital Signs: There were no vitals taken for this visit.  Specialty Comments:  No specialty comments available.  PMFS History: Patient Active Problem List   Diagnosis Date Noted   Paroxysmal atrial fibrillation with RVR (HCC) 08/23/2022   Ascending aortic aneurysm (HCC) 08/23/2022   Bradycardia 08/22/2022   NSTEMI (non-ST elevated myocardial infarction) (HCC) 08/22/2022   Atrial fibrillation with RVR (HCC) 08/22/2022   Hypotension 01/07/2021   S/P arthroscopy of right shoulder 06/02/2020   Closed fracture of neck of right femur (HCC)    Hip fracture (HCC) 03/27/2020    Nontraumatic complete tear of right rotator cuff 02/26/2020   S/P transmetatarsal amputation of foot, left (HCC) 04/25/2016   Chronic osteomyelitis of toe, left (HCC)    Chronic diastolic CHF (congestive heart failure) (HCC) 09/25/2015   Aortic stenosis 08/04/2014   Diabetic foot infection (HCC) 06/03/2014   Peripheral vascular disease (HCC) 06/03/2014   Coronary artery disease involving native coronary artery of native heart without angina pectoris 07/29/2013   Trifascicular block 07/29/2013   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 remote stenting to ramus, prox LAD x2.   Dental crowns present    Diabetes mellitus    First degree AV block    GERD (gastroesophageal reflux disease)    Heart attack (HCC) 06/2004   Heart murmur    aortic   Hx MRSA infection 02/2006   Hyperlipidemia    Hypertension    hx. of - has not been on med. since losing wt. after gastric bypass   Hypothyroidism    Morbid obesity (HCC)    Mucoid cyst of joint 09/2011   left thumb   Sleep apnea    sleep study 03/29/2011; no CPAP use, lost >130lbs   Trifascicular block     Family History  Problem Relation Age of Onset   Heart disease Mother     Past Surgical History:  Procedure Laterality Date   AMPUTATION Left 04/19/2016   Procedure: Left Foot 4th Toe Amputation vs. Transmetatarsal;  Surgeon: Nadara Mustard, MD;  Location: [facility];  Service: Orthopedics;  Laterality: Left;   BIOPSY  04/07/2019   Procedure: BIOPSY;  Surgeon: Kathi Der, MD;  Location: [facility];  Service: Gastroenterology;;   CATARACT EXTRACTION  02/2010; 03/2010   COLONOSCOPY WITH PROPOFOL N/A  09/14/2014   Procedure: COLONOSCOPY WITH PROPOFOL;  Surgeon: Charolett Bumpers, MD;  Location: [facility];  Service: Endoscopy;  Laterality: N/A;   COLONOSCOPY WITH PROPOFOL N/A 04/07/2019   Procedure: COLONOSCOPY WITH PROPOFOL;  Surgeon: Kathi Der, MD;  Location: [facility];  Service: Gastroenterology;  Laterality: N/A;   CORONARY ANGIOPLASTY WITH STENT PLACEMENT  07/12/2004; 08/03/2004   total of 3 stents   CORONARY STENT INTERVENTION N/A 08/23/2022   Procedure: CORONARY STENT INTERVENTION;  Surgeon: Swaziland, Peter M,  MD;  Location: [facility];  Service: Cardiovascular;  Laterality: N/A;   I & D EXTREMITY Right 06/04/2014   Procedure: IRRIGATION AND DEBRIDEMENT EXTREMITY;  Surgeon: Kathryne Hitch, MD;  Location: [facility] OR;  Service: Orthopedics;  Laterality: Right;   LEFT HEART CATH AND CORONARY ANGIOGRAPHY N/A 08/23/2022   Procedure: LEFT HEART CATH AND CORONARY ANGIOGRAPHY;  Surgeon: Swaziland, Peter M, MD;  Location: [facility] INVASIVE CV LAB;  Service: Cardiovascular;  Laterality: N/A;   MASS EXCISION  10/04/2011   Procedure: EXCISION MASS;  Surgeon: Nicki Reaper, MD;  Location: [facility];  Service: Orthopedics;  Laterality: Left;  excision cyst debridment ip joint of left thumb   PROSTATECTOMY     right below knee amputation  2018   ROUX-EN-Y GASTRIC BYPASS  10/10/2010   laparoscopic   SHOULDER ARTHROSCOPY WITH ROTATOR CUFF REPAIR AND SUBACROMIAL DECOMPRESSION Right 03/23/2020   Procedure: RIGHT SHOULDER ARTHROSCOPY WITH DEBRIDEMENT AND  ROTATOR CUFF REPAIR;  Surgeon: Kathryne Hitch, MD;  Location: [facility];  Service: Orthopedics;  Laterality: Right;   TOE AMPUTATION  02/23/2006   left foot second ray amputation   TOTAL HIP ARTHROPLASTY Right 03/29/2020   Procedure: TOTAL HIP ARTHROPLASTY POSTERIOR APPROACH;  Surgeon: Tarry Kos, MD;  Location: [facility];  Service: Orthopedics;  Laterality: Right;   TRANSMETATARSAL AMPUTATION Left    TRIGGER FINGER RELEASE Right  09/16/2019   Procedure: RELEASE TRIGGER FINGER/A-1 PULLEY;  Surgeon: Cindee Salt, MD;  Location: [facility];  Service: Orthopedics;  Laterality: Right;  IV REGIONAL FOREARM BLOCK   Social History   Occupational History   Occupation: IT trainer taxes  Tobacco Use   Smoking status: Never   Smokeless tobacco: Never  Substance and Sexual Activity   Alcohol use: No   Drug use: No   Sexual activity: Yes

## 2022-09-01 DIAGNOSIS — N529 Male erectile dysfunction, unspecified: Secondary | ICD-10-CM | POA: Diagnosis not present

## 2022-09-01 DIAGNOSIS — I480 Paroxysmal atrial fibrillation: Secondary | ICD-10-CM | POA: Diagnosis not present

## 2022-09-01 DIAGNOSIS — I2510 Atherosclerotic heart disease of native coronary artery without angina pectoris: Secondary | ICD-10-CM | POA: Diagnosis not present

## 2022-09-01 DIAGNOSIS — E039 Hypothyroidism, unspecified: Secondary | ICD-10-CM | POA: Diagnosis not present

## 2022-09-01 DIAGNOSIS — I10 Essential (primary) hypertension: Secondary | ICD-10-CM | POA: Diagnosis not present

## 2022-09-01 DIAGNOSIS — E1149 Type 2 diabetes mellitus with other diabetic neurological complication: Secondary | ICD-10-CM | POA: Diagnosis not present

## 2022-09-01 DIAGNOSIS — D6869 Other thrombophilia: Secondary | ICD-10-CM | POA: Diagnosis not present

## 2022-09-01 DIAGNOSIS — G4733 Obstructive sleep apnea (adult) (pediatric): Secondary | ICD-10-CM | POA: Diagnosis not present

## 2022-09-01 DIAGNOSIS — E876 Hypokalemia: Secondary | ICD-10-CM | POA: Diagnosis not present

## 2022-09-01 DIAGNOSIS — R634 Abnormal weight loss: Secondary | ICD-10-CM | POA: Diagnosis not present

## 2022-09-01 DIAGNOSIS — D649 Anemia, unspecified: Secondary | ICD-10-CM | POA: Diagnosis not present

## 2022-09-01 DIAGNOSIS — I7121 Aneurysm of the ascending aorta, without rupture: Secondary | ICD-10-CM | POA: Diagnosis not present

## 2022-09-07 NOTE — Progress Notes (Signed)
Office Visit    Patient Name: [name] Date of Encounter: 09/08/2022  Primary Care Provider:  Thana Ates, MD Primary Cardiologist:  Donato Schultz, MD Primary Electrophysiologist: None   Past Medical History    Past Medical History:  Diagnosis Date   Anemia    low iron   Arthritis    Coronary artery disease 2006   a. remote stenting to ramus, prox LAD x2.   Dental crowns present    Diabetes mellitus    First degree AV block    GERD (gastroesophageal reflux disease)    Heart attack (HCC) 06/2004   Heart murmur    aortic   Hx MRSA infection 02/2006   Hyperlipidemia    Hypertension    hx. of - has not been on med. since losing wt. after gastric bypass   Hypothyroidism    Morbid obesity (HCC)    Mucoid cyst of joint 09/2011   left thumb   Sleep apnea    sleep study 03/29/2011; no CPAP use, lost >130lbs   Trifascicular block    Past Surgical History:  Procedure Laterality Date   AMPUTATION Left 04/19/2016   Procedure: Left Foot 4th Toe Amputation vs. Transmetatarsal;  Surgeon: Nadara Mustard, MD;  Location: [facility];  Service: Orthopedics;  Laterality: Left;   BIOPSY  04/07/2019   Procedure: BIOPSY;  Surgeon: Kathi Der, MD;  [facility]: [facility];  Service: Gastroenterology;;   CATARACT EXTRACTION  02/2010; 03/2010   COLONOSCOPY WITH PROPOFOL N/A 09/14/2014   Procedure: COLONOSCOPY WITH PROPOFOL;  Surgeon: Charolett Bumpers, MD;  Location: [facility];  Service: Endoscopy;  Laterality: N/A;   COLONOSCOPY WITH PROPOFOL N/A 04/07/2019   Procedure: COLONOSCOPY WITH PROPOFOL;  Surgeon: Kathi Der, MD;  Location: [facility];  Service: Gastroenterology;  Laterality: N/A;   CORONARY ANGIOPLASTY WITH STENT PLACEMENT  07/12/2004; 08/03/2004   total of 3 stents   CORONARY STENT INTERVENTION N/A 08/23/2022   Procedure: CORONARY STENT INTERVENTION;  Surgeon: Swaziland, Peter M, MD;  Location: [facility] INVASIVE CV LAB;  Service: Cardiovascular;  Laterality: N/A;   I & D EXTREMITY  Right 06/04/2014   Procedure: IRRIGATION AND DEBRIDEMENT EXTREMITY;  Surgeon: Kathryne Hitch, MD;  Location: [facility] OR;  Service: Orthopedics;  Laterality: Right;   LEFT HEART CATH AND CORONARY ANGIOGRAPHY N/A 08/23/2022   Procedure: LEFT HEART CATH AND CORONARY ANGIOGRAPHY;  Surgeon: Swaziland, Peter M, MD;  Location: [facility] INVASIVE CV LAB;  Service: Cardiovascular;  Laterality: N/A;   MASS EXCISION  10/04/2011   Procedure: EXCISION MASS;  Surgeon: Nicki Reaper, MD;  Location: [facility];  Service: Orthopedics;  Laterality: Left;  excision cyst debridment ip joint of left thumb   PROSTATECTOMY     right below knee amputation  2018   ROUX-EN-Y GASTRIC BYPASS  10/10/2010   laparoscopic   SHOULDER ARTHROSCOPY WITH ROTATOR CUFF REPAIR AND SUBACROMIAL DECOMPRESSION Right 03/23/2020   Procedure: RIGHT SHOULDER ARTHROSCOPY WITH DEBRIDEMENT AND  ROTATOR CUFF REPAIR;  Surgeon: Kathryne Hitch, MD;  Location: [facility];  Service: Orthopedics;  Laterality: Right;   TOE AMPUTATION  02/23/2006   left foot second ray amputation   TOTAL HIP ARTHROPLASTY Right 03/29/2020   Procedure: TOTAL HIP ARTHROPLASTY POSTERIOR APPROACH;  Surgeon: Tarry Kos, MD;  Location: [facility];  Service: Orthopedics;  Laterality: Right;   TRANSMETATARSAL AMPUTATION Left    TRIGGER FINGER RELEASE Right 09/16/2019   Procedure: RELEASE TRIGGER FINGER/A-1 PULLEY;  Surgeon: Cindee Salt, MD;  Location: [facility];  Service: Orthopedics;  Laterality: Right;  IV REGIONAL FOREARM BLOCK    Allergies  Allergies  Allergen Reactions   Other Other (See Comments)   Moxifloxacin Rash    Avelox Rash at injection site   Penicillins Rash     Has patient had a PCN reaction causing immediate rash, facial/tongue/throat swelling, SOB or lightheadedness with hypotension: #  #  #  YES  #  #  #  Has patient had a PCN reaction causing severe rash involving mucus membranes or skin necrosis: No Has patient had a PCN reaction  that required hospitalization unknown Has patient had a PCN reaction occurring within the last 10 years: No If all of the above answers are "NO", then may proceed with Cephalosporin use.   Quinolones Itching   Sulfamethoxazole-Trimethoprim Itching, Rash and Other (See Comments)     History of Present Illness    [name] is a 72 y.o. male with PMH of CAD MI 2006 s/p DES to ramus, proximal LAD x2 with diffuse disease in distal RCA, chronic diastolic CHF, aortic stenosis GERD, trifascicular block (first-degree AV block,RBBB, LAFB), morbid obesity s/p gastric bypass 2012, HLD, hypothyroidism, sleep apnea, Right BKA  and DM type II who presents today for follow-up of tachycardia.   Mr. Landt has been followed by Dr. Anne Fu since 2015.  He was seen in the ED on 08/08/2021 with complaint of palpitations that woke him up out of his sleep.  Patient was transported via EMS to the ED and given fluid hydration with resolution of palpitations.  He was seen in follow-up and wore a 14-day ZIO monitor that showed sinus rhythm and no evidence of AF with 5 runs of VT noted.  He was seen by Dr. Dorris Fetch for evaluation of aortic aneurysm that measured most recently at 5 cm with plan to continue radiographic follow-up.  He was seen by Dr. Anne Fu on 06/30/2022 and was doing well with no syncope or chest pain.  He endorsed some hypotension and lisinopril was decreased to 2.5 mg.    He was seen in the ED on 08/22/2022 with complaint of tachycardia, lightheadedness.  He was found to be in AF with RVR after undergoing a colonoscopy procedure the previous day.  He converted to sinus bradycardia with RBBB and LAFB after administration of IV Lopressor.  He was also found to have mild discomfort and new ST depression in EKG.  He underwent LHC that showed 90% proximal LAD lesion, tandem 99% proximal RCA lesion that was 99% stenosed that was attempted for PCI but unable to cross wire with second lesion.  Successful DES was placed to  proximal LAD x 1 and patient was treated with triple therapy of Plavix, Eliquis, and ASA for 1 month.  Patient may have RCA attempted at a later date through a CTO approach.  He was evaluated by EP with plan to continue monitoring and avoidance of beta-blockers.  Since last being seen in the office patient reports that he has been doing well with no new cardiac complaints.  He denies any bleeding with triple therapy currently and blood pressures have stayed controlled at 130s to 140s over 60s systolically.  During today's visit blood pressure was initially elevated at 144/64 and was 132/68 on recheck.  He was seen recently by his PCP and noted that potassium and magnesium have returned to normal baseline.  He endorses a hacking dry cough that occurs in the morning and has some production  with clear phlegm.  During today's visit we discussed his new diagnosis of atrial fibrillation and recent coronary PCI.  He had all questions answered to his satisfaction today.  He is scheduled to meet with [facility] to discuss possible polypectomy and also will follow-up with Dr. Dareen Piano in the next few months.  Patient denies chest pain, palpitations, dyspnea, PND, orthopnea, nausea, vomiting, dizziness, syncope, edema, weight gain, or early satiety.  Home Medications    Current Outpatient Medications  Medication Sig Dispense Refill   amoxicillin (AMOXIL) 500 MG tablet Take 4 tablets by mouth 1 hour prior to dental procedure. (Patient taking differently: Take 1,000 mg by mouth daily as needed (For dental procedure only).) 4 tablet 2   apixaban (ELIQUIS) 5 MG TABS tablet Take 1 tablet (5 mg total) by mouth 2 (two) times daily. 180 tablet 3   aspirin EC 81 MG tablet Take 1 tablet (81 mg total) by mouth daily. Then stop     atorvastatin (LIPITOR) 40 MG tablet Take 1 tablet (40 mg total) by mouth at bedtime. 30 tablet 2   Calcium Citrate-Vitamin D (CALCIUM CITRATE CHEWY BITE PO) Take 500 mg by mouth 3  (three) times daily.     clopidogrel (PLAVIX) 75 MG tablet Take 1 tablet (75 mg total) by mouth daily with breakfast. 90 tablet 3   ferrous sulfate 325 (65 FE) MG tablet Take 325 mg by mouth See admin instructions. Take one tablet by mouth on Monday, Wednesday and Fridays     fluticasone (FLONASE) 50 MCG/ACT nasal spray Place 2 sprays into both nostrils daily as needed for allergies.      gabapentin (NEURONTIN) 100 MG capsule TAKE 1 CAPSULE (100 MG TOTAL) BY MOUTH 4 (FOUR) TIMES DAILY. WHEN NECESSARY FOR NEUROPATHY PAIN (Patient taking differently: Take 100 mg by mouth 4 (four) times daily.) 120 capsule 3   Hypromellose (GENTEAL SEVERE OP) Place 1 drop into the right eye daily as needed (Dry eyes).     ketoconazole (NIZORAL) 2 % cream Apply 1 Application topically daily as needed for rash.     levothyroxine (SYNTHROID) 125 MCG tablet Take 1 tablet (125 mcg total) by mouth daily at 6 (six) AM. 90 tablet 3   metFORMIN (GLUCOPHAGE-XR) 500 MG 24 hr tablet Take 1,000 mg by mouth 2 (two) times daily.     Multiple Vitamin (MULTIVITAMIN WITH MINERALS) TABS tablet Take 1 tablet by mouth in the morning and at bedtime. Gummies (Patient taking differently: Take 2 tablets by mouth in the morning and at bedtime. Gummies)     Multiple Vitamins-Minerals (PRESERVISION AREDS 2) CAPS Take 1 capsule by mouth daily.     nitroGLYCERIN (NITROSTAT) 0.4 MG SL tablet Place 1 tablet (0.4 mg total) under the tongue every 5 (five) minutes x 3 doses as needed for chest pain. 25 tablet 3   Omega 3 1000 MG CAPS Take 1,000 mg by mouth daily.     Valerian Root 450 MG CAPS Take 450 mg by mouth at bedtime.     vitamin B-12 (CYANOCOBALAMIN) 1000 MCG tablet Take 1,000 mcg by mouth 3 (three) times a week. Mon, wed, Friday     No current facility-administered medications for this visit.     Review of Systems  Please see the history of present illness.    (+) Productive cough (All other systems reviewed and are otherwise negative  except as noted above.  Physical Exam    Wt Readings from Last 3 Encounters:  09/08/22 220 lb  12.8 oz (100.2 kg)  08/23/22 219 lb 2.2 oz (99.4 kg)  08/22/22 223 lb (101.2 kg)   VS: Vitals:   09/08/22 1354 09/08/22 1447  BP: (!) 144/64 132/68  Pulse: (!) 53   SpO2: 99%   ,Body mass index is 24.26 kg/m.  Constitutional:      Appearance: Healthy appearance. Not in distress.  Neck:     Vascular: JVD normal.  Pulmonary:     Effort: Patient has diminished breath sounds on the right lower lobe and clear to auscultation on left upper and lower lobe. -He reports productive cough with clear phlegm. Cardiovascular:     Normal rate. Regular rhythm. Normal S1. Normal S2.      Murmurs: There is no murmur.  Edema:    Peripheral edema absent.  Abdominal:     Palpations: Abdomen is soft non tender. There is no hepatomegaly.  Skin:    General: Skin is warm and dry.  Neurological:     General: No focal deficit present.     Mental Status: Alert and oriented to person, place and time.     Cranial Nerves: Cranial nerves are intact.  EKG/LABS/ Recent Cardiac Studies    ECG personally reviewed by me today -none completed today   Risk Assessment/Calculations:    CHA2DS2-VASc Score = 4   This indicates a 4.8% annual risk of stroke. The patient's score is based upon: CHF History: 0 HTN History: 1 Diabetes History: 1 Stroke History: 0 Vascular Disease History: 1 Age Score: 1 Gender Score: 0           Lab Results  Component Value Date   WBC 6.4 08/24/2022   HGB 12.4 (L) 08/24/2022   HCT 37.5 (L) 08/24/2022   MCV 90.4 08/24/2022   PLT 187 08/24/2022   Lab Results  Component Value Date   CREATININE 0.83 08/24/2022   BUN 7 (L) 08/24/2022   NA 135 08/24/2022   K 3.1 (L) 08/24/2022   CL 105 08/24/2022   CO2 21 (L) 08/24/2022   Lab Results  Component Value Date   ALT 17 08/24/2022   AST 32 08/24/2022   ALKPHOS 46 08/24/2022   BILITOT 0.5 08/24/2022   Lab Results   Component Value Date   CHOL 107 08/24/2022   HDL 32 (L) 08/24/2022   LDLCALC 55 08/24/2022   TRIG 98 08/24/2022   CHOLHDL 3.3 08/24/2022    Lab Results  Component Value Date   HGBA1C 6.7 (H) 08/24/2022     Assessment & Plan    1.  Paroxysmal atrial fibrillation: -Patient recently admitted with new onset AF with RVR and converted with IV Lopressor but developed bradycardia with junctional rhythm. -He was evaluated by EP and plan to continue to monitor at this time with no indication for PPM placement -Patient's most recent creatinine was 0.94 and hemoglobin was 13.6 -Continue Eliquis 5 mg twice daily -CHA2DS2-VASc Score = 4 [CHF History: 0, HTN History: 1, Diabetes History: 1, Stroke History: 0, Vascular Disease History: 1, Age Score: 1, Gender Score: 0].  Therefore, the patient's annual risk of stroke is 4.8 %.      2.  Coronary artery disease: -s/p NSTEMI with LHC completed showing severe two-vessel CAD with 90% proximal LAD stenosis and tandem stenosis of 99% in proximal mid RCA.  PCI to proximal LAD to perform PCI of RCA due to wire not compliant. -Today patient reports no chest pain or anginal equivalent. -He is currently on triple therapy with Plavix, Eliquis,  and aspirin and will discontinue aspirin at the end of August.  3.  DM type II: -Patient's hemoglobin A1c was 6.7 -Continue Glucophage XR  4.  Essential hypertension: -Patient's blood pressure today was initially elevated at 144/64 and was 132/68 on recheck. -Continue lifestyle modification and Dash type diet -Patient will check blood pressures over the next few weeks and we will plan to restart lisinopril BP is elevated.  5.  Trifascicular block: -Patient currently asymptomatic and may require PPM in the future. -No beta-blockers  6.  Productive cough: -Patient has noted a productive cough with clear phlegm that occurs in the morning -On auscultation he had consolidation and diminished breath sounds on the  right lower lobe -Patient will complete chest x-ray for further evaluation today.    Cardiac Rehabilitation Eligibility Assessment       Disposition: Follow-up with Donato Schultz, MD or APP in 1 months    Medication Adjustments/Labs and Tests Ordered: Current medicines are reviewed at length with the patient today.  Concerns regarding medicines are outlined above.   Signed, Napoleon Form, Leodis Rains, NP 09/08/2022, 2:49 PM [facility]

## 2022-09-08 ENCOUNTER — Encounter: Payer: Self-pay | Admitting: Nurse Practitioner

## 2022-09-08 ENCOUNTER — Other Ambulatory Visit (HOSPITAL_COMMUNITY): Payer: Self-pay

## 2022-09-08 ENCOUNTER — Ambulatory Visit: Payer: Medicare Other | Admitting: Nurse Practitioner

## 2022-09-08 ENCOUNTER — Ambulatory Visit
Admission: RE | Admit: 2022-09-08 | Discharge: 2022-09-08 | Disposition: A | Discharge status: Home / Self Care | Payer: Medicare Other | Source: Ambulatory Visit | Attending: Nurse Practitioner | Admitting: Nurse Practitioner

## 2022-09-08 VITALS — BP 132/68 | HR 53 | Ht >= 80 in | Wt 220.8 lb

## 2022-09-08 DIAGNOSIS — I2510 Atherosclerotic heart disease of native coronary artery without angina pectoris: Secondary | ICD-10-CM | POA: Insufficient documentation

## 2022-09-08 DIAGNOSIS — I453 Trifascicular block: Secondary | ICD-10-CM | POA: Insufficient documentation

## 2022-09-08 DIAGNOSIS — I10 Essential (primary) hypertension: Secondary | ICD-10-CM

## 2022-09-08 DIAGNOSIS — E1159 Type 2 diabetes mellitus with other circulatory complications: Secondary | ICD-10-CM

## 2022-09-08 DIAGNOSIS — Z7984 Long term (current) use of oral hypoglycemic drugs: Secondary | ICD-10-CM | POA: Diagnosis not present

## 2022-09-08 DIAGNOSIS — I480 Paroxysmal atrial fibrillation: Secondary | ICD-10-CM

## 2022-09-08 DIAGNOSIS — Z0389 Encounter for observation for other suspected diseases and conditions ruled out: Secondary | ICD-10-CM | POA: Diagnosis not present

## 2022-09-08 DIAGNOSIS — R058 Other specified cough: Secondary | ICD-10-CM | POA: Diagnosis not present

## 2022-09-08 NOTE — Patient Instructions (Addendum)
Medication Instructions:  PLEASE STOP ASPIRIN SEPTEMBER 1ST, 2024. *If you need a refill on your cardiac medications before your next appointment, please call your pharmacy*   Lab Work: NONE ORDERED   Testing/Procedures: A chest x-ray takes a picture of the organs and structures inside the chest, including the heart, lungs, and blood vessels. This test can show several things, including, whether the heart is enlarges; whether fluid is building up in the lungs; and whether pacemaker / defibrillator leads are still in place. [address]    Follow-Up: At [facility], you and your health needs are our priority.  As part of our continuing mission to provide you with exceptional heart care, we have created designated Provider Care Teams.  These Care Teams include your primary Cardiologist (physician) and Advanced Practice Providers (APPs -  Physician Assistants and Nurse Practitioners) who all work together to provide you with the care you need, when you need it.  We recommend signing up for the patient portal called "MyChart".  Sign up information is provided on this After Visit Summary.  MyChart is used to connect with patients for Virtual Visits (Telemedicine).  Patients are able to view lab/test results, encounter notes, upcoming appointments, etc.  Non-urgent messages can be sent to your provider as well.   To learn more about what you can do with MyChart, go to [email or web address].    Your next appointment:   1 month(s)  Provider:   Robin Searing, NP      Other Instructions Check your blood pressure daily for 2 weeks then contact the office with your readings.

## 2022-09-11 ENCOUNTER — Telehealth (HOSPITAL_COMMUNITY): Payer: Self-pay

## 2022-09-11 NOTE — Telephone Encounter (Signed)
Called pt in regards to scheduling orientation for Cardiac rehab. LM on VM.  Sent my chart

## 2022-09-12 ENCOUNTER — Telehealth (HOSPITAL_COMMUNITY): Payer: Self-pay

## 2022-09-12 NOTE — Telephone Encounter (Signed)
Pt called yesterday 8/20 and is wanting to hold on off on scheduling orientation due to having polyps removed and talking to a surgeon next week as well as his fitting for a new socket! Pt will call once he has more answers to determine a good day for orientation as well as a good time to start classes.

## 2022-09-14 ENCOUNTER — Encounter: Payer: Self-pay | Admitting: Orthopaedic Surgery

## 2022-09-14 ENCOUNTER — Other Ambulatory Visit: Payer: Self-pay

## 2022-09-14 ENCOUNTER — Ambulatory Visit (INDEPENDENT_AMBULATORY_CARE_PROVIDER_SITE_OTHER): Payer: Medicare Other | Admitting: Orthopaedic Surgery

## 2022-09-14 DIAGNOSIS — M542 Cervicalgia: Secondary | ICD-10-CM

## 2022-09-14 MED ORDER — CYCLOBENZAPRINE HCL 10 MG PO TABS: 10.0000 mg | ORAL_TABLET | Freq: Every day | ORAL | 0 refills | Status: DC

## 2022-09-14 NOTE — Progress Notes (Signed)
Office Visit Note   Patient: [name]           Date of Birth: Feb 09, 1950           MRN: [id number] Visit Date: 09/14/2022              Requested by: Kathryne Hitch, MD [address] [place],  [place] [address] PCP: Thana Ates, MD   Assessment & Plan: Visit Diagnoses:  1. Cervicalgia     Plan: We will send him to formal physical therapy for his cervical spine.  Will work on stretching, range of motion, tissue massage, modalities and home exercise program.  He will follow-up with [place] as needed pain persist or becomes worse.  He was given a muscle relaxant to take mainly at night.  Questions were encouraged and answered  Follow-Up Instructions: Return if symptoms worsen or fail to improve.   Orders:  Orders Placed This Encounter  Procedures   XR Cervical Spine 2 or 3 views   Meds ordered this encounter  Medications   cyclobenzaprine (FLEXERIL) 10 MG tablet    Sig: Take 1 tablet (10 mg total) by mouth at bedtime.    Dispense:  30 tablet    Refill:  0      Procedures: No procedures performed   Clinical Data: No additional findings.   Subjective: Chief Complaint  Patient presents with   Neck - Pain    HPI Patient 72 year old male well-known to Dr. Raye Sorrow service comes in today with neck pain for the last 3 to 4 weeks no known injury.  Pain is mostly when turning to the left.  No known injury.  Denies any radicular symptoms down either arm.  He has no waking pain.  He has taken some Tylenol which has been beneficial.  Ranks his pain to be 5-6 out of 10 pain at worst.  Pain is mid neck and described as sharp.  Review of Systems Negative for fevers chills.  Please see HPI otherwise negative or noncontributory  Objective: Vital Signs: There were no vitals taken for this visit.  Physical Exam General: Well-developed well-nourished male in no acute distress Psych: Alert and oriented x 3 Vascular radial pulses are 2+ and equal and symmetric  bilaterally.  Ortho Exam Cervical spine good range of motion.  Nontender over the cervical spine spinal column.  Nontender medial border scapula bilaterally.  Slight atrophy in trapezius bilaterally.  Upper extremities 5 out of 5 strength throughout against resistance.  Good range of motion bilateral shoulders without pain.  Sensation grossly intact light touch bilateral hands.  Full motor bilateral hands. Specialty Comments:  No specialty comments available.  Imaging: XR Cervical Spine 2 or 3 views  Result Date: 09/14/2022 Cervical spine 3 views: Shows normal lordotic curvature.  Disc base overall well-maintained.  No acute fractures no spondylolisthesis.  Minimal arthritic changes.    PMFS History: Patient Active Problem List   Diagnosis Date Noted   Paroxysmal atrial fibrillation with RVR (HCC) 08/23/2022   Ascending aortic aneurysm (HCC) 08/23/2022   Bradycardia 08/22/2022   NSTEMI (non-ST elevated myocardial infarction) (HCC) 08/22/2022   Atrial fibrillation with RVR (HCC) 08/22/2022   Hypotension 01/07/2021   S/P arthroscopy of right shoulder 06/02/2020   Closed fracture of neck of right femur (HCC)    Hip fracture (HCC) 03/27/2020   Nontraumatic complete tear of right rotator cuff 02/26/2020   S/P transmetatarsal amputation of foot, left (HCC) 04/25/2016   Chronic osteomyelitis of toe, left (  HCC)    Chronic diastolic CHF (congestive heart failure) (HCC) 09/25/2015   Aortic stenosis 08/04/2014   Diabetic foot infection (HCC) 06/03/2014   Peripheral vascular disease (HCC) 06/03/2014   Coronary artery disease involving native coronary artery of native heart without angina pectoris 07/29/2013   Trifascicular block 07/29/2013   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 07/26/2011   Hypothyroidism 01/22/2007   Type 2 diabetes mellitus with vascular disease (HCC) 01/22/2007   HYPERLIPIDEMIA, MIXED 01/22/2007   MYOCARDIAL INFARCTION, HX OF 01/22/2007   ANEMIA, IRON DEFICIENCY, HX  OF 01/22/2007   Past Medical History:  Diagnosis Date   Anemia    low iron   Arthritis    Coronary artery disease 2006   a. remote stenting to ramus, prox LAD x2.   Dental crowns present    Diabetes mellitus    First degree AV block    GERD (gastroesophageal reflux disease)    Heart attack (HCC) 06/2004   Heart murmur    aortic   Hx MRSA infection 02/2006   Hyperlipidemia    Hypertension    hx. of - has not been on med. since losing wt. after gastric bypass   Hypothyroidism    Morbid obesity (HCC)    Mucoid cyst of joint 09/2011   left thumb   Sleep apnea    sleep study 03/29/2011; no CPAP use, lost >130lbs   Trifascicular block     Family History  Problem Relation Age of Onset   Heart disease Mother     Past Surgical History:  Procedure Laterality Date   AMPUTATION Left 04/19/2016   Procedure: Left Foot 4th Toe Amputation vs. Transmetatarsal;  Surgeon: Nadara Mustard, MD;  [facility]: [facility];  Service: Orthopedics;  Laterality: Left;   BIOPSY  04/07/2019   Procedure: BIOPSY;  Surgeon: Kathi Der, MD;  Location: [facility];  Service: Gastroenterology;;   CATARACT EXTRACTION  02/2010; 03/2010   COLONOSCOPY WITH PROPOFOL N/A 09/14/2014   Procedure: COLONOSCOPY WITH PROPOFOL;  Surgeon: Charolett Bumpers, MD;  Location: [facility];  Service: Endoscopy;  Laterality: N/A;   COLONOSCOPY WITH PROPOFOL N/A 04/07/2019   Procedure: COLONOSCOPY WITH PROPOFOL;  Surgeon: Kathi Der, MD;  [facility]: [facility];  Service: Gastroenterology;  Laterality: N/A;   CORONARY ANGIOPLASTY WITH STENT PLACEMENT  07/12/2004; 08/03/2004   total of 3 stents   CORONARY STENT INTERVENTION N/A 08/23/2022   Procedure: CORONARY STENT INTERVENTION;  Surgeon: Swaziland, Peter M, MD;  Location: [facility] INVASIVE CV LAB;  Service: Cardiovascular;  Laterality: N/A;   I & D EXTREMITY Right 06/04/2014   Procedure: IRRIGATION AND DEBRIDEMENT EXTREMITY;  Surgeon: Kathryne Hitch, MD;  Location: [facility] OR;  Service:  Orthopedics;  Laterality: Right;   LEFT HEART CATH AND CORONARY ANGIOGRAPHY N/A 08/23/2022   Procedure: LEFT HEART CATH AND CORONARY ANGIOGRAPHY;  Surgeon: Swaziland, Peter M, MD;  Location: [facility] INVASIVE CV LAB;  Service: Cardiovascular;  Laterality: N/A;   MASS EXCISION  10/04/2011   Procedure: EXCISION MASS;  Surgeon: Nicki Reaper, MD;  Location:  SURGERY CENTER;  Service: Orthopedics;  Laterality: Left;  excision cyst debridment ip joint of left thumb   PROSTATECTOMY     right below knee amputation  2018   ROUX-EN-Y GASTRIC BYPASS  10/10/2010   laparoscopic   SHOULDER ARTHROSCOPY WITH ROTATOR CUFF REPAIR AND SUBACROMIAL DECOMPRESSION Right 03/23/2020   Procedure: RIGHT SHOULDER ARTHROSCOPY WITH DEBRIDEMENT AND  ROTATOR CUFF REPAIR;  Surgeon: Kathryne Hitch, MD;  Location: [facility]  OR;  Service: Orthopedics;  Laterality: Right;   TOE AMPUTATION  02/23/2006   left foot second ray amputation   TOTAL HIP ARTHROPLASTY Right 03/29/2020   Procedure: TOTAL HIP ARTHROPLASTY POSTERIOR APPROACH;  Surgeon: Tarry Kos, MD;  [facility]: [facility] OR;  Service: Orthopedics;  Laterality: Right;   TRANSMETATARSAL AMPUTATION Left    TRIGGER FINGER RELEASE Right 09/16/2019   Procedure: RELEASE TRIGGER FINGER/A-1 PULLEY;  Surgeon: Cindee Salt, MD;  Location: [facility];  Service: Orthopedics;  Laterality: Right;  IV REGIONAL FOREARM BLOCK   Social History   Occupational History   Occupation: IT trainer taxes  Tobacco Use   Smoking status: Never   Smokeless tobacco: Never  Substance and Sexual Activity   Alcohol use: No   Drug use: No   Sexual activity: Yes

## 2022-09-15 ENCOUNTER — Other Ambulatory Visit: Payer: Self-pay

## 2022-09-15 DIAGNOSIS — M542 Cervicalgia: Secondary | ICD-10-CM

## 2022-09-18 ENCOUNTER — Other Ambulatory Visit: Payer: Self-pay | Admitting: Surgery

## 2022-09-18 DIAGNOSIS — D126 Benign neoplasm of colon, unspecified: Secondary | ICD-10-CM | POA: Diagnosis not present

## 2022-09-18 DIAGNOSIS — E1169 Type 2 diabetes mellitus with other specified complication: Secondary | ICD-10-CM | POA: Diagnosis not present

## 2022-09-18 DIAGNOSIS — Z89511 Acquired absence of right leg below knee: Secondary | ICD-10-CM | POA: Diagnosis not present

## 2022-09-18 DIAGNOSIS — Z955 Presence of coronary angioplasty implant and graft: Secondary | ICD-10-CM | POA: Diagnosis not present

## 2022-09-18 DIAGNOSIS — I7121 Aneurysm of the ascending aorta, without rupture: Secondary | ICD-10-CM | POA: Diagnosis not present

## 2022-09-18 DIAGNOSIS — L97509 Non-pressure chronic ulcer of other part of unspecified foot with unspecified severity: Secondary | ICD-10-CM | POA: Diagnosis not present

## 2022-09-18 DIAGNOSIS — E11621 Type 2 diabetes mellitus with foot ulcer: Secondary | ICD-10-CM | POA: Diagnosis not present

## 2022-09-18 DIAGNOSIS — Z9884 Bariatric surgery status: Secondary | ICD-10-CM | POA: Diagnosis not present

## 2022-09-18 DIAGNOSIS — M869 Osteomyelitis, unspecified: Secondary | ICD-10-CM | POA: Diagnosis not present

## 2022-09-26 ENCOUNTER — Ambulatory Visit (INDEPENDENT_AMBULATORY_CARE_PROVIDER_SITE_OTHER): Payer: Medicare Other | Admitting: Orthopedic Surgery

## 2022-09-26 DIAGNOSIS — Z89432 Acquired absence of left foot: Secondary | ICD-10-CM

## 2022-09-26 DIAGNOSIS — L97521 Non-pressure chronic ulcer of other part of left foot limited to breakdown of skin: Secondary | ICD-10-CM

## 2022-10-03 ENCOUNTER — Encounter: Payer: Self-pay | Admitting: Orthopedic Surgery

## 2022-10-03 NOTE — Progress Notes (Signed)
Office Visit Note   Patient: [name]           Date of Birth: [birth year]           MRN: [id number] Visit Date: 09/26/2022              Requested by: Thana Ates, MD [address] [place],  [place] [address] PCP: Thana Ates, MD  Chief Complaint  Patient presents with   Left Foot - Follow-up    TMA ulcer       HPI: Patient is a 72 year old gentleman who presents for [facility] grade 1 ulcer left foot status post transmetatarsal amputation with a right below-knee amputation.  Assessment & Plan: Visit Diagnoses:  1. History of transmetatarsal amputation of left foot (HCC)     Plan: Ulcer was debrided.  Orthotic modified.  Follow-Up Instructions: Return in about 4 weeks (around 10/24/2022).   Ortho Exam  Patient is alert, oriented, no adenopathy, well-dressed, normal affect, normal respiratory effort. Examination there is no cellulitis or drainage from the left foot.  He has a Wagner grade 1 ulcer across the transmetatarsal forefoot.  After informed consent a 10 blade knife was used debride the skin and soft tissue back to healthy viable granulation tissue.  This was touched with silver nitrate the wound is 1 cm in diameter after debridement 1 mm deep.  Patient's orthotics were also modified to unload pressure from this area.  Imaging: No results found. No images are attached to the encounter.  Labs: Lab Results  Component Value Date   HGBA1C 6.7 (H) 08/24/2022   HGBA1C 6.8 (H) 03/28/2020   HGBA1C 6.5 (H) 09/26/2015   REPTSTATUS 08/01/2016 FINAL 07/30/2016   GRAMSTAIN  06/04/2014    MODERATE WBC PRESENT, PREDOMINANTLY PMN NO SQUAMOUS EPITHELIAL CELLS SEEN MODERATE GRAM POSITIVE COCCI IN CLUSTERS Performed at [facility]    GRAMSTAIN  06/04/2014    MODERATE WBC PRESENT, PREDOMINANTLY PMN NO SQUAMOUS EPITHELIAL CELLS SEEN MODERATE GRAM POSITIVE COCCI IN PAIRS Performed at [facility]    CULT  07/30/2016    NO GROWTH Performed  at [facility], [address], [place], [place] [address]      Lab Results  Component Value Date   ALBUMIN 3.2 (L) 08/24/2022   ALBUMIN 3.4 (L) 08/23/2022   ALBUMIN 4.1 08/22/2022    Lab Results  Component Value Date   MG 1.8 08/22/2022   MG 1.8 08/08/2021   MG 2.0 04/02/2020   Lab Results  Component Value Date   VD25OH 53.03 03/28/2020    No results found for: "PREALBUMIN"    Latest Ref Rng & Units 08/24/2022   12:58 AM 08/23/2022    3:49 AM 08/22/2022    3:57 PM  CBC EXTENDED  WBC 4.0 - 10.5 K/uL 6.4  5.3  9.5   RBC 4.22 - 5.81 MIL/uL 4.15  4.05  4.85   Hemoglobin 13.0 - 17.0 g/dL 66.4  40.3  47.4   HCT 39.0 - 52.0 % 37.5  38.0  43.8   Platelets 150 - 400 K/uL 187  163  236      There is no height or weight on file to calculate BMI.  Orders:  No orders of the defined types were placed in this encounter.  No orders of the defined types were placed in this encounter.    Procedures: No procedures performed  Clinical Data: No additional findings.  ROS:  All other systems  negative, except as noted in the HPI. Review of Systems  Objective: Vital Signs: There were no vitals taken for this visit.  Specialty Comments:  No specialty comments available.  PMFS History: Patient Active Problem List   Diagnosis Date Noted   Paroxysmal atrial fibrillation with RVR (HCC) 08/23/2022   Ascending aortic aneurysm (HCC) 08/23/2022   Bradycardia 08/22/2022   NSTEMI (non-ST elevated myocardial infarction) (HCC) 08/22/2022   Atrial fibrillation with RVR (HCC) 08/22/2022   Hypotension 01/07/2021   S/P arthroscopy of right shoulder 06/02/2020   Closed fracture of neck of right femur (HCC)    Hip fracture (HCC) 03/27/2020   Nontraumatic complete tear of right rotator cuff 02/26/2020   S/P transmetatarsal amputation of foot, left (HCC) 04/25/2016   Chronic osteomyelitis of toe, left (HCC)    Chronic diastolic CHF (congestive heart failure) (HCC) 09/25/2015    Aortic stenosis 08/04/2014   Diabetic foot infection (HCC) 06/03/2014   Peripheral vascular disease (HCC) 06/03/2014   Coronary artery disease involving native coronary artery of native heart without angina pectoris 07/29/2013   Trifascicular block 07/29/2013   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 remote stenting to ramus, prox LAD x2.   Dental crowns present    Diabetes mellitus    First degree AV block    GERD (gastroesophageal reflux disease)    Heart attack (HCC) 06/2004   Heart murmur    aortic   Hx MRSA infection 02/2006   Hyperlipidemia    Hypertension    hx. of - has not been on med. since losing wt. after gastric bypass   Hypothyroidism    Morbid obesity (HCC)    Mucoid cyst of joint 09/2011   left thumb   Sleep apnea    sleep study 03/29/2011; no CPAP use, lost >130lbs   Trifascicular block     Family History  Problem Relation Age of Onset   Heart disease Mother     Past Surgical History:  Procedure Laterality Date   AMPUTATION Left 04/19/2016   Procedure: Left Foot 4th Toe Amputation vs. Transmetatarsal;  Surgeon: Nadara Mustard, MD;  Location: [facility];  Service: Orthopedics;  Laterality: Left;   BIOPSY  04/07/2019   Procedure: BIOPSY;  Surgeon: Kathi Der, MD;  [facility];  Service: Gastroenterology;;   CATARACT EXTRACTION  02/2010; 03/2010   COLONOSCOPY WITH PROPOFOL N/A 09/14/2014   Procedure: COLONOSCOPY WITH PROPOFOL;  Surgeon: Charolett Bumpers, MD;  [facility];  Service: Endoscopy;  Laterality: N/A;   COLONOSCOPY WITH PROPOFOL N/A 04/07/2019   Procedure: COLONOSCOPY WITH PROPOFOL;  Surgeon:  Kathi Der, MD;  Location: [facility];  Service: Gastroenterology;  Laterality: N/A;   CORONARY ANGIOPLASTY WITH STENT PLACEMENT  07/12/2004; 08/03/2004   total of 3 stents   CORONARY STENT INTERVENTION N/A 08/23/2022   Procedure: CORONARY STENT INTERVENTION;  Surgeon: Swazil[place], Peter M, MD;  Location: [facility] INVASIVE CV LAB;  Service: Cardiovascular;  Laterality: N/A;   I & D EXTREMITY Right 06/04/2014   Procedure: IRRIGATION AND DEBRIDEMENT EXTREMITY;  Surgeon: Kathryne Hitch, MD;  Location: [facility] OR;  Service: Orthopedics;  Laterality: Right;   LEFT HEART CATH AND CORONARY ANGIOGRAPHY N/A 08/23/2022   Procedure: LEFT HEART CATH AND CORONARY ANGIOGRAPHY;  Surgeon: Swaziland, Peter M, MD;  Location: [facility] INVASIVE CV LAB;  Service: Cardiovascular;  Laterality: N/A;   MASS EXCISION  10/04/2011   Procedure: EXCISION MASS;  Surgeon: Nicki Reaper, MD;  Location: [facility];  Service: Orthopedics;  Laterality: Left;  excision cyst debridment ip joint of left thumb   PROSTATECTOMY     right below knee amputation  2018   ROUX-EN-Y GASTRIC BYPASS  10/10/2010   laparoscopic   SHOULDER ARTHROSCOPY WITH ROTATOR CUFF REPAIR AND SUBACROMIAL DECOMPRESSION Right 03/23/2020   Procedure: RIGHT SHOULDER ARTHROSCOPY WITH DEBRIDEMENT AND  ROTATOR CUFF REPAIR;  Surgeon: Kathryne Hitch, MD;  Location[facility];  Service: Orthopedics;  Laterality: Right;   TOE AMPUTATION  02/23/2006   left foot second ray amputation   TOTAL HIP ARTHROPLASTY Right 03/29/2020   Procedure: TOTAL HIP ARTHROPLASTY POSTERIOR APPROACH;  Surgeon: Tarry Kos, MD;  Location: [facility];  Service: Orthopedics;  Laterality: Right;   TRANSMETATARSAL AMPUTATION Left    TRIGGER FINGER RELEASE Right 09/16/2019   Procedure: RELEASE TRIGGER FINGER/A-1 PULLEY;  Surgeon: Cindee Salt, MD;  Location: [facility];  Service: Orthopedics;  Laterality: Right;  IV REGIONAL FOREARM BLOCK   Social History   Occupational History    Occupation: IT trainer taxes  Tobacco Use   Smoking status: Never   Smokeless tobacco: Never  Substance and Sexual Activity   Alcohol use: No   Drug use: No   Sexual activity: Yes

## 2022-10-09 NOTE — Progress Notes (Unsigned)
Cardiology Office Note    Patient Name: [name] Date of Encounter: 10/09/2022  Primary Care Provider:  Thana Ates, MD Primary Cardiologist:  Donato Schultz, MD Primary Electrophysiologist: None   Past Medical History    Past Medical History:  Diagnosis Date   Anemia    low iron   Arthritis    Coronary artery disease 2006   a. remote stenting to ramus, prox LAD x2.   Dental crowns present    Diabetes mellitus    First degree AV block    GERD (gastroesophageal reflux disease)    Heart attack (HCC) 06/2004   Heart murmur    aortic   Hx MRSA infection 02/2006   Hyperlipidemia    Hypertension    hx. of - has not been on med. since losing wt. after gastric bypass   Hypothyroidism    Morbid obesity (HCC)    Mucoid cyst of joint 09/2011   left thumb   Sleep apnea    sleep study 03/29/2011; no CPAP use, lost >130lbs   Trifascicular block     History of Present Illness   [name] is a 72 y.o. male with PMH of CAD MI 2006 s/p DES to ramus, proximal LAD x2 with diffuse disease in distal RCA, chronic diastolic CHF, aortic stenosis GERD, trifascicular block (first-degree AV block,RBBB, LAFB), morbid obesity s/p gastric bypass 2012, HLD, hypothyroidism, sleep apnea, Right BKA  and DM type II who presents today for 1 month follow-up.  [name] presents today for 1 month follow-up.  He was admitted on 08/22/2022 with tachycardia and lightheadedness and was found to be in AF with RVR.  He was also found to have mild discomfort and new ST depression in EKG.  He underwent LHC that showed 90% proximal LAD lesion, tandem 99% proximal RCA lesion that was 99% stenosed that was attempted for PCI but unable to cross wire with second lesion.  Successful DES was placed to proximal LAD x 1 and patient was treated with triple therapy of Plavix, Eliquis, and ASA for 1 month.  He was seen in follow-up on 09/08/2022 and was doing well with no complaints of chest pain.  He reported a dry hacking cough with  some mild phlegm production and was sent for chest x-ray that was normal.  During today's visit the patient reports*** .  Patient denies chest pain, palpitations, dyspnea, PND, orthopnea, nausea, vomiting, dizziness, syncope, edema, weight gain, or early satiety.  ***Notes: -Surgical clearance? -Last ischemic evaluation: -Last echo: -Interim ED visits: Review of Systems  Please see the history of present illness.    All other systems reviewed and are otherwise negative except as noted above.  Physical Exam    Wt Readings from Last 3 Encounters:  09/08/22 220 lb 12.8 oz (100.2 kg)  08/23/22 219 lb 2.2 oz (99.4 kg)  08/22/22 223 lb (101.2 kg)   WG:NFAOZ were no vitals filed for this visit.,There is no height or weight on file to calculate BMI. GEN: Well nourished, well developed in no acute distress Neck: No JVD; No carotid bruits Pulmonary: Clear to auscultation without rales, wheezing or rhonchi  Cardiovascular: Normal rate. Regular rhythm. Normal S1. Normal S2.   Murmurs: There is no murmur.  ABDOMEN: Soft, non-tender, non-distended EXTREMITIES:  No edema; No deformity   EKG/LABS/ Recent Cardiac Studies   ECG personally reviewed by me today - ***  Risk Assessment/Calculations:   {Does this patient have ATRIAL FIBRILLATION?[phone]}  Lab Results  Component Value Date   WBC 6.4 08/24/2022   HGB 12.4 (L) 08/24/2022   HCT 37.5 (L) 08/24/2022   MCV 90.4 08/24/2022   PLT 187 08/24/2022   Lab Results  Component Value Date   CREATININE 0.83 08/24/2022   BUN 7 (L) 08/24/2022   NA 135 08/24/2022   K 3.1 (L) 08/24/2022   CL 105 08/24/2022   CO2 21 (L) 08/24/2022   Lab Results  Component Value Date   CHOL 107 08/24/2022   HDL 32 (L) 08/24/2022   LDLCALC 55 08/24/2022   TRIG 98 08/24/2022   CHOLHDL 3.3 08/24/2022    Lab Results  Component Value Date   HGBA1C 6.7 (H) 08/24/2022   Assessment & Plan    1.Coronary artery disease: -s/p NSTEMI with LHC  completed showing severe two-vessel CAD with 90% proximal LAD stenosis and tandem stenosis of 99% in proximal mid RCA.  PCI to proximal LAD to perform PCI of RCA due to wire not compliant. -Today patient reports   2.Paroxysmal atrial fibrillation: -Patient recently admitted with new onset AF with RVR and converted with IV Lopressor but developed bradycardia with junctional rhythm. -He was evaluated by EP and plan to continue to monitor at this time with no indication for PPM placement -Patient's most recent creatinine was 0.94 and hemoglobin was 13.6  3.Essential hypertension: -Patient's blood pressure today was   4.Trifascicular block: -Patient currently asymptomatic and may require PPM in the future. -No beta-blockers  5. DM type II: -Patient's hemoglobin A1c was 6.7 -Continue Glucophage XR      Disposition: Follow-up with Donato Schultz, MD or APP in *** months {Are you ordering a CV Procedure (e.g. stress test, cath, DCCV, TEE, etc)?   Press F2        :[id number]}   Signed, Napoleon Form, Leodis Rains, NP 10/09/2022, 8:23 AM [facility]

## 2022-10-11 ENCOUNTER — Ambulatory Visit: Payer: Medicare Other | Attending: Nurse Practitioner | Admitting: Nurse Practitioner

## 2022-10-11 ENCOUNTER — Ambulatory Visit
Admission: RE | Admit: 2022-10-11 | Discharge: 2022-10-11 | Disposition: A | Discharge status: Home / Self Care | Payer: Medicare Other | Source: Ambulatory Visit | Attending: Surgery | Admitting: Surgery

## 2022-10-11 ENCOUNTER — Encounter: Payer: Self-pay | Admitting: Nurse Practitioner

## 2022-10-11 VITALS — BP 140/72 | HR 59 | Ht >= 80 in | Wt 218.4 lb

## 2022-10-11 DIAGNOSIS — I480 Paroxysmal atrial fibrillation: Secondary | ICD-10-CM | POA: Diagnosis not present

## 2022-10-11 DIAGNOSIS — E1159 Type 2 diabetes mellitus with other circulatory complications: Secondary | ICD-10-CM | POA: Insufficient documentation

## 2022-10-11 DIAGNOSIS — I700 Atherosclerosis of aorta: Secondary | ICD-10-CM | POA: Diagnosis not present

## 2022-10-11 DIAGNOSIS — I453 Trifascicular block: Secondary | ICD-10-CM | POA: Insufficient documentation

## 2022-10-11 DIAGNOSIS — I10 Essential (primary) hypertension: Secondary | ICD-10-CM | POA: Insufficient documentation

## 2022-10-11 DIAGNOSIS — D126 Benign neoplasm of colon, unspecified: Secondary | ICD-10-CM

## 2022-10-11 DIAGNOSIS — I2510 Atherosclerotic heart disease of native coronary artery without angina pectoris: Secondary | ICD-10-CM | POA: Insufficient documentation

## 2022-10-11 MED ORDER — IOPAMIDOL (ISOVUE-300) INJECTION 61%
100.0000 mL | Freq: Once | INTRAVENOUS | Status: AC | PRN
  Administered 2022-10-11: 100 mL via INTRAVENOUS

## 2022-10-11 NOTE — Patient Instructions (Signed)
Medication Instructions:  Your physician recommends that you continue on your current medications as directed. Please refer to the Current Medication list given to you today.  *If you need a refill on your cardiac medications before your next appointment, please call your pharmacy*   Lab Work: None  If you have labs (blood work) drawn today and your tests are completely normal, you will receive your results only by: MyChart Message (if you have MyChart) OR A paper copy in the mail If you have any lab test that is abnormal or we need to change your treatment, we will call you to review the results.   Testing/Procedures: None   Follow-Up: At [facility], you and your health needs are our priority.  As part of our continuing mission to provide you with exceptional heart care, we have created designated Provider Care Teams.  These Care Teams include your primary Cardiologist (physician) and Advanced Practice Providers (APPs -  Physician Assistants and Nurse Practitioners) who all work together to provide you with the care you need, when you need it.  We recommend signing up for the patient portal called "MyChart".  Sign up information is provided on this After Visit Summary.  MyChart is used to connect with patients for Virtual Visits (Telemedicine).  Patients are able to view lab/test results, encounter notes, upcoming appointments, etc.  Non-urgent messages can be sent to your provider as well.   To learn more about what you can do with MyChart, go to [email or web address].    Your next appointment:   3 month(s)  Provider:   Donato Schultz, MD     Other Instructions Heart-Healthy Eating Plan Many factors influence your heart health, including eating and exercise habits. Heart health is also called coronary health. Coronary risk increases with abnormal blood fat (lipid) levels. A heart-healthy eating plan includes limiting unhealthy fats, increasing healthy fats, limiting salt  (sodium) intake, and making other diet and lifestyle changes. What is my plan? Your health care provider may recommend that: You limit your fat intake to _________% or less of your total calories each day. You limit your saturated fat intake to _________% or less of your total calories each day. You limit the amount of cholesterol in your diet to less than _________ mg per day. You limit the amount of sodium in your diet to less than _________ mg per day. What are tips for following this plan? Cooking Cook foods using methods other than frying. Baking, boiling, grilling, and broiling are all good options. Other ways to reduce fat include: Removing the skin from poultry. Removing all visible fats from meats. Steaming vegetables in water or broth. Meal planning  At meals, imagine dividing your plate into fourths: Fill one-half of your plate with vegetables and green salads. Fill one-fourth of your plate with whole grains. Fill one-fourth of your plate with lean protein foods. Eat 2-4 cups of vegetables per day. One cup of vegetables equals 1 cup (91 g) broccoli or cauliflower florets, 2 medium carrots, 1 large bell pepper, 1 large sweet potato, 1 large tomato, 1 medium white potato, 2 cups (150 g) raw leafy greens. Eat 1-2 cups of fruit per day. One cup of fruit equals 1 small apple, 1 large banana, 1 cup (237 g) mixed fruit, 1 large orange,  cup (82 g) dried fruit, 1 cup (240 mL) 100% fruit juice. Eat more foods that contain soluble fiber. Examples include apples, broccoli, carrots, beans, peas, and barley. Aim to get 25-30 g of  fiber per day. Increase your consumption of legumes, nuts, and seeds to 4-5 servings per week. One serving of dried beans or legumes equals  cup (90 g) cooked, 1 serving of nuts is  oz (12 almonds, 24 pistachios, or 7 walnut halves), and 1 serving of seeds equals  oz (8 g). Fats Choose healthy fats more often. Choose monounsaturated and polyunsaturated fats,  such as olive and canola oils, avocado oil, flaxseeds, walnuts, almonds, and seeds. Eat more omega-3 fats. Choose salmon, mackerel, sardines, tuna, flaxseed oil, and ground flaxseeds. Aim to eat fish at least 2 times each week. Check food labels carefully to identify foods with trans fats or high amounts of saturated fat. Limit saturated fats. These are found in animal products, such as meats, butter, and cream. Plant sources of saturated fats include palm oil, palm kernel oil, and coconut oil. Avoid foods with partially hydrogenated oils in them. These contain trans fats. Examples are stick margarine, some tub margarines, cookies, crackers, and other baked goods. Avoid fried foods. General information Eat more home-cooked food and less restaurant, buffet, and fast food. Limit or avoid alcohol. Limit foods that are high in added sugar and simple starches such as foods made using white refined flour (white breads, pastries, sweets). Lose weight if you are overweight. Losing just 5-10% of your body weight can help your overall health and prevent diseases such as diabetes and heart disease. Monitor your sodium intake, especially if you have high blood pressure. Talk with your health care provider about your sodium intake. Try to incorporate more vegetarian meals weekly. What foods should I eat? Fruits All fresh, canned (in natural juice), or frozen fruits. Vegetables Fresh or frozen vegetables (raw, steamed, roasted, or grilled). Green salads. Grains Most grains. Choose whole wheat and whole grains most of the time. Rice and pasta, including brown rice and pastas made with whole wheat. Meats and other proteins Lean, well-trimmed beef, veal, pork, and lamb. Chicken and [place] without skin. All fish and shellfish. Wild duck, rabbit, pheasant, and venison. Egg whites or low-cholesterol egg substitutes. Dried beans, peas, lentils, and tofu. Seeds and most nuts. Dairy Low-fat or nonfat cheeses,  including ricotta and mozzarella. Skim or 1% milk (liquid, powdered, or evaporated). Buttermilk made with low-fat milk. Nonfat or low-fat yogurt. Fats and oils Non-hydrogenated (trans-free) margarines. Vegetable oils, including soybean, sesame, sunflower, olive, avocado, peanut, safflower, corn, canola, and cottonseed. Salad dressings or mayonnaise made with a vegetable oil. Beverages Water (mineral or sparkling). Coffee and tea. Unsweetened ice tea. Diet beverages. Sweets and desserts Sherbet, gelatin, and fruit ice. Small amounts of dark chocolate. Limit all sweets and desserts. Seasonings and condiments All seasonings and condiments. The items listed above may not be a complete list of foods and beverages you can eat. Contact a dietitian for more options. What foods should I avoid? Fruits Canned fruit in heavy syrup. Fruit in cream or butter sauce. Fried fruit. Limit coconut. Vegetables Vegetables cooked in cheese, cream, or butter sauce. Fried vegetables. Grains Breads made with saturated or trans fats, oils, or whole milk. Croissants. Sweet rolls. Donuts. High-fat crackers, such as cheese crackers and chips. Meats and other proteins Fatty meats, such as hot dogs, ribs, sausage, bacon, rib-eye roast or steak. High-fat deli meats, such as salami and bologna. Caviar. Domestic duck and goose. Organ meats, such as liver. Dairy Cream, sour cream, cream cheese, and creamed cottage cheese. Whole-milk cheeses. Whole or 2% milk (liquid, evaporated, or condensed). Whole buttermilk. Cream sauce or high-fat cheese  sauce. Whole-milk yogurt. Fats and oils Meat fat, or shortening. Cocoa butter, hydrogenated oils, palm oil, coconut oil, palm kernel oil. Solid fats and shortenings, including bacon fat, salt pork, lard, and butter. Nondairy cream substitutes. Salad dressings with cheese or sour cream. Beverages Regular sodas and any drinks with added sugar. Sweets and desserts Frosting. Pudding.  Cookies. Cakes. Pies. Milk chocolate or white chocolate. Buttered syrups. Full-fat ice cream or ice cream drinks. The items listed above may not be a complete list of foods and beverages to avoid. Contact a dietitian for more information. Summary Heart-healthy meal planning includes limiting unhealthy fats, increasing healthy fats, limiting salt (sodium) intake and making other diet and lifestyle changes. Lose weight if you are overweight. Losing just 5-10% of your body weight can help your overall health and prevent diseases such as diabetes and heart disease. Focus on eating a balance of foods, including fruits and vegetables, low-fat or nonfat dairy, lean protein, nuts and legumes, whole grains, and heart-healthy oils and fats. This information is not intended to replace advice given to you by your health care provider. Make sure you discuss any questions you have with your health care provider. Document Revised: 02/14/2021 Document Reviewed: 02/14/2021 Elsevier Patient Education  2024 Elsevier Inc.  Your physician has requested that you regularly monitor and record your blood pressure readings at home. Please use the same machine at the same time of day to check your readings and record them to bring to your follow-up visit.   Please continue to monitor blood pressures and keep a log of your readings.    Make sure to check 2 hours after your medications.    AVOID these things for 30 minutes before checking your blood pressure: No Drinking caffeine. No Drinking alcohol. No Eating. No Smoking. No Exercising.   Five minutes before checking your blood pressure: Pee. Sit in a dining chair. Avoid sitting in a soft couch or armchair. Be quiet. Do not talk

## 2022-10-13 DIAGNOSIS — G43109 Migraine with aura, not intractable, without status migrainosus: Secondary | ICD-10-CM | POA: Diagnosis not present

## 2022-10-13 DIAGNOSIS — E039 Hypothyroidism, unspecified: Secondary | ICD-10-CM | POA: Diagnosis not present

## 2022-10-24 ENCOUNTER — Ambulatory Visit (INDEPENDENT_AMBULATORY_CARE_PROVIDER_SITE_OTHER): Payer: Medicare Other | Admitting: Orthopedic Surgery

## 2022-10-24 DIAGNOSIS — L97521 Non-pressure chronic ulcer of other part of left foot limited to breakdown of skin: Secondary | ICD-10-CM | POA: Diagnosis not present

## 2022-10-24 DIAGNOSIS — Z89432 Acquired absence of left foot: Secondary | ICD-10-CM | POA: Diagnosis not present

## 2022-10-25 ENCOUNTER — Other Ambulatory Visit: Payer: Self-pay

## 2022-10-25 ENCOUNTER — Encounter: Payer: Self-pay | Admitting: Orthopedic Surgery

## 2022-10-25 ENCOUNTER — Ambulatory Visit (HOSPITAL_BASED_OUTPATIENT_CLINIC_OR_DEPARTMENT_OTHER): Payer: Medicare Other | Attending: Physician Assistant | Admitting: Physical Therapy

## 2022-10-25 ENCOUNTER — Encounter (HOSPITAL_BASED_OUTPATIENT_CLINIC_OR_DEPARTMENT_OTHER): Payer: Self-pay | Admitting: Physical Therapy

## 2022-10-25 DIAGNOSIS — M542 Cervicalgia: Secondary | ICD-10-CM | POA: Insufficient documentation

## 2022-10-25 DIAGNOSIS — R29898 Other symptoms and signs involving the musculoskeletal system: Secondary | ICD-10-CM | POA: Insufficient documentation

## 2022-10-25 NOTE — Therapy (Signed)
OUTPATIENT PHYSICAL THERAPY CERVICAL EVALUATION   Patient Name: [name] MRN: [id number] DOB:07-10-1950, 72 y.o., male Today's Date: 10/25/2022  END OF SESSION:  PT End of Session - 10/25/22 1020     Visit Number 1    Number of Visits 12    Date for PT Re-Evaluation 12/06/22    Authorization Type Medicare    Progress Note Due on Visit 10    PT Start Time 1020    PT Stop Time 1057    PT Time Calculation (min) 37 min    Activity Tolerance Patient tolerated treatment well    Behavior During Therapy WFL for tasks assessed/performed             Past Medical History:  Diagnosis Date   Anemia    low iron   Arthritis    Coronary artery disease 2006   a. remote stenting to ramus, prox LAD x2.   Dental crowns present    Diabetes mellitus    First degree AV block    GERD (gastroesophageal reflux disease)    Heart attack (HCC) 06/2004   Heart murmur    aortic   Hx MRSA infection 02/2006   Hyperlipidemia    Hypertension    hx. of - has not been on med. since losing wt. after gastric bypass   Hypothyroidism    Morbid obesity (HCC)    Mucoid cyst of joint 09/2011   left thumb   Sleep apnea    sleep study 03/29/2011; no CPAP use, lost >130lbs   Trifascicular block    Past Surgical History:  Procedure Laterality Date   AMPUTATION Left 04/19/2016   Procedure: Left Foot 4th Toe Amputation vs. Transmetatarsal;  Surgeon: Nadara Mustard, MD;  Location: [facility] OR;  Service: Orthopedics;  Laterality: Left;   BIOPSY  04/07/2019   Procedure: BIOPSY;  Surgeon: Kathi Der, MD;  Location: [facility];  Service: Gastroenterology;;   CATARACT EXTRACTION  02/2010; 03/2010   COLONOSCOPY WITH PROPOFOL N/A 09/14/2014   Procedure: COLONOSCOPY WITH PROPOFOL;  Surgeon: Charolett Bumpers, MD;  Location: [facility];  Service: Endoscopy;  Laterality: N/A;   COLONOSCOPY WITH PROPOFOL N/A 04/07/2019   Procedure: COLONOSCOPY WITH PROPOFOL;  Surgeon: Kathi Der, MD;  Location: [facility];   Service: Gastroenterology;  Laterality: N/A;   CORONARY ANGIOPLASTY WITH STENT PLACEMENT  07/12/2004; 08/03/2004   total of 3 stents   CORONARY STENT INTERVENTION N/A 08/23/2022   Procedure: CORONARY STENT INTERVENTION;  Surgeon: Swaziland, Peter M, MD;  Location: [facility] INVASIVE CV LAB;  Service: Cardiovascular;  Laterality: N/A;   I & D EXTREMITY Right 06/04/2014   Procedure: IRRIGATION AND DEBRIDEMENT EXTREMITY;  Surgeon: Kathryne Hitch, MD;  Location: [facility] OR;  Service: Orthopedics;  Laterality: Right;   LEFT HEART CATH AND CORONARY ANGIOGRAPHY N/A 08/23/2022   Procedure: LEFT HEART CATH AND CORONARY ANGIOGRAPHY;  Surgeon: [place]azil[place], Peter M, MD;  Location: [facility] INVASIVE CV LAB;  Service: Cardiovascular;  Laterality: N/A;   MASS EXCISION  10/04/2011   Procedure: EXCISION MASS;  Surgeon: Nicki Reaper, MD;  Location: [facility];  Service: Orthopedics;  Laterality: Left;  excision cyst debridment ip joint of left thumb   PROSTATECTOMY     right below knee amputation  2018   ROUX-EN-Y GASTRIC BYPASS  10/10/2010   laparoscopic   SHOULDER ARTHROSCOPY WITH ROTATOR CUFF REPAIR AND SUBACROMIAL DECOMPRESSION Right 03/23/2020   Procedure: RIGHT SHOULDER ARTHROSCOPY WITH DEBRIDEMENT AND  ROTATOR CUFF REPAIR;  Surgeon: Doneen Poisson  Y, MD;  Location: [facility] OR;  Service: Orthopedics;  Laterality: Right;   TOE AMPUTATION  02/23/2006   left foot second ray amputation   TOTAL HIP ARTHROPLASTY Right 03/29/2020   Procedure: TOTAL HIP ARTHROPLASTY POSTERIOR APPROACH;  Surgeon: Tarry Kos, MD;  Location: [facility] OR;  Service: Orthopedics;  Laterality: Right;   TRANSMETATARSAL AMPUTATION Left    TRIGGER FINGER RELEASE Right 09/16/2019   Procedure: RELEASE TRIGGER FINGER/A-1 PULLEY;  Surgeon: Cindee Salt, MD;  Location: [facility];  Service: Orthopedics;  Laterality: Right;  IV REGIONAL FOREARM BLOCK   Patient Active Problem List   Diagnosis Date Noted   Paroxysmal atrial fibrillation with RVR  (HCC) 08/23/2022   Ascending aortic aneurysm (HCC) 08/23/2022   Bradycardia 08/22/2022   NSTEMI (non-ST elevated myocardial infarction) (HCC) 08/22/2022   Atrial fibrillation with RVR (HCC) 08/22/2022   Hypotension 01/07/2021   S/P arthroscopy of right shoulder 06/02/2020   Closed fracture of neck of right femur (HCC)    Hip fracture (HCC) 03/27/2020   Nontraumatic complete tear of right rotator cuff 02/26/2020   S/P transmetatarsal amputation of foot, left (HCC) 04/25/2016   Chronic osteomyelitis of toe, left (HCC)    Chronic diastolic CHF (congestive heart failure) (HCC) 09/25/2015   Aortic stenosis 08/04/2014   Diabetic foot infection (HCC) 06/03/2014   Peripheral vascular disease (HCC) 06/03/2014   Coronary artery disease involving native coronary artery of native heart without angina pectoris 07/29/2013   Trifascicular block 07/29/2013   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 07/26/2011   Hypothyroidism 01/22/2007   Type 2 diabetes mellitus with vascular disease (HCC) 01/22/2007   HYPERLIPIDEMIA, MIXED 01/22/2007   MYOCARDIAL INFARCTION, HX OF 01/22/2007   ANEMIA, IRON DEFICIENCY, HX OF 01/22/2007    PCP: Hillard Danker MD  REFERRING PROVIDER: Kirtland Bouchard, PA-C  REFERRING DIAG: M54.2 (ICD-10-CM) - Cervicalgia  THERAPY DIAG:  Cervicalgia  Other symptoms and signs involving the musculoskeletal system  Rationale for Evaluation and Treatment: Rehabilitation  ONSET DATE: 2.5-3 months  SUBJECTIVE:                                                                                                                                                                                                         SUBJECTIVE STATEMENT: Patient states neck symptoms began about 2.5-3 months. Noticed pain with L rotation with driving. Really painful with transfer at dentist. Last few days, was not as tender at night while reading. Pain with turning to left with reaching. Reads in flexed position.    PERTINENT HISTORY:  Afib, hx NSTEMI, hx transmetatarsal amputation L, R BKA, CHF, PVD, HLD, Ascending aortic aneurysm, DM  PAIN:  Are you having pain? Yes: NPRS scale: 0/10 at rest, 4-5/10 Pain location: L cervical spine Pain description: sharp Aggravating factors: L rotation Relieving factors: rest  PRECAUTIONS: None  WEIGHT BEARING RESTRICTIONS: No  FALLS:  Has patient fallen in last 6 months? No  PLOF: Independent  PATIENT GOALS: turn to left and not have pain   OBJECTIVE: (objective measures from initial evaluation unless otherwise dated)  DIAGNOSTIC FINDINGS:  XR 09/14/22: Cervical spine 3 views: Shows normal lordotic curvature.  Disc base overall well-maintained.  No acute fractures no spondylolisthesis.  Minimal arthritic changes.   PATIENT SURVEYS:  FOTO 56% function  COGNITION: Overall cognitive status: Within functional limits for tasks assessed  SENSATION: WFL  POSTURE: rounded shoulders, forward head, and increased thoracic kyphosis  PALPATION: TTP bilateral UT, concordant on L, minimal to no tenderness throughout rest of cervical spine; hypomobile and lordotic c/sp  CERVICAL ROM:   Active ROM A/PROM (deg) eval  Flexion 27  Extension 40  Right lateral flexion 30 *  Left lateral flexion 23 *  Right rotation 58  Left rotation 59 *   (Blank rows = not tested) *=pain/symptoms  UPPER EXTREMITY ROM: WFL for tasks assessed  Active ROM Right eval Left eval  Shoulder flexion    Shoulder extension    Shoulder abduction    Shoulder adduction    Shoulder extension    Shoulder internal rotation    Shoulder external rotation    Elbow flexion    Elbow extension    Wrist flexion    Wrist extension    Wrist ulnar deviation    Wrist radial deviation    Wrist pronation    Wrist supination     (Blank rows = not tested) *=pain/symptoms  UPPER EXTREMITY MMT:  MMT Right eval Left eval  Shoulder flexion 4+ 4+  Shoulder extension    Shoulder  abduction 4+ 4+  Shoulder adduction    Shoulder extension    Shoulder internal rotation    Shoulder external rotation    Middle trapezius    Lower trapezius    Elbow flexion 5 5  Elbow extension 5 5  Wrist flexion    Wrist extension    Wrist ulnar deviation    Wrist radial deviation    Wrist pronation    Wrist supination    Grip strength     (Blank rows = not tested) *=pain/symptoms  CERVICAL SPECIAL TESTS:  Distraction test: Negative    TODAY'S TREATMENT:                                                                                                                              DATE:  10/25/22  Seated scap retraction 2 x 10 Seated UT stretch 3 x 30 second holds L  PATIENT EDUCATION:  Education details: Patient educated on exam findings, POC, scope  of PT, HEP, and dry needling. Person educated: Patient Education method: Explanation, Demonstration, and Handouts Education comprehension: verbalized understanding, returned demonstration, verbal cues required, and tactile cues required  HOME EXERCISE PROGRAM: Access Code: [email or web address] URL: [email or web address]   Date: 10/25/2022 - Seated Scapular Retraction  - 1 x daily - 7 x weekly - 3 sets - 10 reps - Seated Upper Trapezius Stretch (Mirrored)  - 3 x daily - 7 x weekly - 3 reps - 20-30 second hold  ASSESSMENT:  CLINICAL IMPRESSION: Patient a 72 y.o. y.o. male who was seen today for physical therapy evaluation and treatment for cervicalgia. Patient presents with pain limited deficits in cervical spine strength, ROM, endurance, activity tolerance, posture and functional mobility with ADL. Patient is having to modify and restrict ADL as indicated by outcome measure score as well as subjective information and objective measures which is affecting overall participation. Patient will benefit from skilled physical therapy in order to improve function and reduce impairment.  OBJECTIVE IMPAIRMENTS: decreased activity  tolerance, decreased endurance, decreased mobility, decreased ROM, decreased strength, hypomobility, increased muscle spasms, impaired flexibility, impaired UE functional use, improper body mechanics, postural dysfunction, and pain.   ACTIVITY LIMITATIONS: carrying, lifting, bending, reach over head, and caring for others  PARTICIPATION LIMITATIONS: meal prep, cleaning, laundry, driving, shopping, community activity, and yard work  PERSONAL FACTORS: 3+ comorbidities: Afib, hx NSTEMI, hx transmetatarsal, amputation L, CHF, PVD, HLD, Ascending aortic aneurysm, DM  are also affecting patient's functional outcome.   REHAB POTENTIAL: Good  CLINICAL DECISION MAKING: Stable/uncomplicated  EVALUATION COMPLEXITY: Low   GOALS: Goals reviewed with patient? Yes  SHORT TERM GOALS: Target date: 11/15/2022    Patient will be independent with HEP in order to improve functional outcomes. Baseline: Goal status: INITIAL  2.  Patient will report at least 25% improvement in symptoms for improved quality of life. Baseline:  Goal status: INITIAL    LONG TERM GOALS: Target date: 12/06/2022    Patient will report at least 75% improvement in symptoms for improved quality of life. Baseline:  Goal status: INITIAL  2.  Patient will improve FOTO score by at least 11 points in order to indicate improved tolerance to activity. Baseline: 56% function Goal status: INITIAL  3.  Patient will demonstrate at least 25% improvement in cervical ROM in all restricted planes for improved ability to move head while driving. Baseline: see above Goal status: INITIAL  4.  Patient will be able to return to all activities unrestricted for improved ability to perform work functions and participate with family.  Baseline:  Goal status: INITIAL  5.  Patient will demonstrate grade of 5/5 MMT grade in all tested musculature as evidence of improved strength to assist with lifting at home. Baseline: see above Goal  status: INITIAL      PLAN:  PT FREQUENCY: 2x/week  PT DURATION: 6 weeks  PLANNED INTERVENTIONS: Therapeutic exercises, Therapeutic activity, Neuromuscular re-education, Balance training, Gait training, Patient/Family education, Joint manipulation, Joint mobilization, Stair training, Orthotic/Fit training, DME instructions, Aquatic Therapy, Dry Needling, Electrical stimulation, Spinal manipulation, Spinal mobilization, Cryotherapy, Moist heat, Compression bandaging, scar mobilization, Splintting, Taping, Traction, Ultrasound, Ionotophoresis 4mg /ml Dexamethasone, and Manual therapy  PLAN FOR NEXT SESSION: f/u with HEP; possibly DN, postural strength, cervical mobility   Wyman Songster, PT 10/25/2022, 11:00 AM

## 2022-10-25 NOTE — Progress Notes (Signed)
Office Visit Note   Patient: [name]           Date of Birth: August 05, 1950           MRN: [id number] Visit Date: 10/24/2022              Requested by: Thana Ates, MD [address] [place],  [place] [address] PCP: Thana Ates, MD  Chief Complaint  Patient presents with   Left Foot - Follow-up      HPI: Patient is a 71 year old gentleman who is status post transmetatarsal amputation with a chronic Wagner grade 1 ulcer.  Patient has had orthotics modification and ulcer debridement.  Assessment & Plan: Visit Diagnoses:  1. History of transmetatarsal amputation of left foot (HCC)   2. Non-pressure chronic ulcer of other part of left foot limited to breakdown of skin (HCC)     Plan: Donated Kerecis micro graft was applied after debridement.  Plan for dressing changes and follow-up in 1 week.  The orthotic was further modified.  Follow-Up Instructions: Return in about 1 week (around 10/31/2022).   Ortho Exam  Patient is alert, oriented, no adenopathy, well-dressed, normal affect, normal respiratory effort. Examination patient has a persistent Wagner grade 1 ulcer plantar aspect of the left transmetatarsal amputation there is no cellulitis there is no depth to the wound there is no tunneling.  After informed consent a 10 blade knife was used to debride the skin and soft tissue to prepare the wound bed for donated Kerecis micro graft.  4 cm of donated Kerecis micro graft was applied to a wound that was 2 x 2 cm.  This was covered with a sterile dressing.  Imaging: No results found. No images are attached to the encounter.  Labs: Lab Results  Component Value Date   HGBA1C 6.7 (H) 08/24/2022   HGBA1C 6.8 (H) 03/28/2020   HGBA1C 6.5 (H) 09/26/2015   REPTSTATUS 08/01/2016 FINAL 07/30/2016   GRAMSTAIN  06/04/2014    MODERATE WBC PRESENT, PREDOMINANTLY PMN NO SQUAMOUS EPITHELIAL CELLS SEEN MODERATE GRAM POSITIVE COCCI IN CLUSTERS Performed at [facility]    GRAMSTAIN  06/04/2014    MODERATE WBC PRESENT, PREDOMINANTLY PMN NO SQUAMOUS EPITHELIAL CELLS SEEN MODERATE GRAM POSITIVE COCCI IN PAIRS Performed at [facility]    CULT  07/30/2016    NO GROWTH Performed at [facility], [address] [place], [place] [address]      Lab Results  Component Value Date   ALBUMIN 3.2 (L) 08/24/2022   ALBUMIN 3.4 (L) 08/23/2022   ALBUMIN 4.1 08/22/2022    Lab Results  Component Value Date   MG 1.8 08/22/2022   MG 1.8 08/08/2021   MG 2.0 04/02/2020   Lab Results  Component Value Date   VD25OH 53.03 03/28/2020    No results found for: "PREALBUMIN"    Latest Ref Rng & Units 08/24/2022   12:58 AM 08/23/2022    3:49 AM 08/22/2022    3:57 PM  CBC EXTENDED  WBC 4.0 - 10.5 K/uL 6.4  5.3  9.5   RBC 4.22 - 5.81 MIL/uL 4.15  4.05  4.85   Hemoglobin 13.0 - 17.0 g/dL 25.3  66.4  40.3   HCT 39.0 - 52.0 % 37.5  38.0  43.8   Platelets 150 - 400 K/uL 187  163  236      There is no height or weight on file to calculate BMI.  Orders:  No orders of the defined types were placed in this encounter.  No orders of the defined types were placed in this encounter.    Procedures: No procedures performed  Clinical Data: No additional findings.  ROS:  All other systems negative, except as noted in the HPI. Review of Systems  Objective: Vital Signs: There were no vitals taken for this visit.  Specialty Comments:  No specialty comments available.  PMFS History: Patient Active Problem List   Diagnosis Date Noted   Paroxysmal atrial fibrillation with RVR (HCC) 08/23/2022   Ascending aortic aneurysm (HCC) 08/23/2022   Bradycardia 08/22/2022   NSTEMI (non-ST elevated myocardial infarction) (HCC) 08/22/2022   Atrial fibrillation with RVR (HCC) 08/22/2022   Hypotension 01/07/2021   S/P arthroscopy of right shoulder 06/02/2020   Closed fracture of neck of right femur (HCC)    Hip fracture (HCC) 03/27/2020    Nontraumatic complete tear of right rotator cuff 02/26/2020   S/P transmetatarsal amputation of foot, left (HCC) 04/25/2016   Chronic osteomyelitis of toe, left (HCC)    Chronic diastolic CHF (congestive heart failure) (HCC) 09/25/2015   Aortic stenosis 08/04/2014   Diabetic foot infection (HCC) 06/03/2014   Peripheral vascular disease (HCC) 06/03/2014   Coronary artery disease involving native coronary artery of native heart without angina pectoris 07/29/2013   Trifascicular block 07/29/2013   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 remote stenting to ramus, prox LAD x2.   Dental crowns present    Diabetes mellitus    First degree AV block    GERD (gastroesophageal reflux disease)    Heart attack (HCC) 06/2004   Heart murmur    aortic   Hx MRSA infection 02/2006   Hyperlipidemia    Hypertension    hx. of - has not been on med. since losing wt. after gastric bypass   Hypothyroidism    Morbid obesity (HCC)    Mucoid cyst of joint 09/2011   left thumb   Sleep apnea    sleep study 03/29/2011; no CPAP use, lost >130lbs   Trifascicular block     Family History  Problem Relation Age of Onset   Heart disease Mother     Past Surgical History:  Procedure Laterality Date   AMPUTATION Left 04/19/2016   Procedure: Left Foot 4th Toe Amputation vs. Transmetatarsal;  Surgeon: Nadara Mustard, MD;  Location: [facility];  Service: Orthopedics;  Laterality: Left;   BIOPSY  04/07/2019   Procedure: BIOPSY;  Surgeon: Kathi Der, MD;  Location: [facility];  Service: Gastroenterology;;   CATARACT EXTRACTION  02/2010; 03/2010   COLONOSCOPY WITH PROPOFOL N/A  09/14/2014   Procedure: COLONOSCOPY WITH PROPOFOL;  Surgeon: Charolett Bumpers, MD;  Location: [facility];  Service: Endoscopy;  Laterality: N/A;   COLONOSCOPY WITH PROPOFOL N/A 04/07/2019   Procedure: COLONOSCOPY WITH PROPOFOL;  Surgeon: Kathi Der, MD;  Location: [facility];  Service: Gastroenterology;  Laterality: N/A;   CORONARY ANGIOPLASTY WITH STENT PLACEMENT  07/12/2004; 08/03/2004   total of 3 stents   CORONARY STENT INTERVENTION N/A 08/23/2022   Procedure: CORONARY STENT INTERVENTION;  Surgeon: Swaziland, Peter M,  MD;  Location: [facility];  Service: Cardiovascular;  Laterality: N/A;   I & D EXTREMITY Right 06/04/2014   Procedure: IRRIGATION AND DEBRIDEMENT EXTREMITY;  Surgeon: Kathryne Hitch, MD;  Location: [facility] OR;  Service: Orthopedics;  Laterality: Right;   LEFT HEART CATH AND CORONARY ANGIOGRAPHY N/A 08/23/2022   Procedure: LEFT HEART CATH AND CORONARY ANGIOGRAPHY;  Surgeon: Swaziland, Peter M, MD;  Location: [facility]VASIVE CV LAB;  Service: Cardiovascular;  Laterality: N/A;   MASS EXCISION  10/04/2011   Procedure: EXCISION MASS;  Surgeon: Nicki Reaper, MD;  Location: [facility];  Service: Orthopedics;  Laterality: Left;  excision cyst debridment ip joint of left thumb   PROSTATECTOMY     right below knee amputation  2018   ROUX-EN-Y GASTRIC BYPASS  10/10/2010   laparoscopic   SHOULDER ARTHROSCOPY WITH ROTATOR CUFF REPAIR AND SUBACROMIAL DECOMPRESSION Right 03/23/2020   Procedure: RIGHT SHOULDER ARTHROSCOPY WITH DEBRIDEMENT AND  ROTATOR CUFF REPAIR;  Surgeon: Kathryne Hitch, MD;  Location: [facility];  Service: Orthopedics;  Laterality: Right;   TOE AMPUTATION  02/23/2006   left foot second ray amputation   TOTAL HIP ARTHROPLASTY Right 03/29/2020   Procedure: TOTAL HIP ARTHROPLASTY POSTERIOR APPROACH;  Surgeon: Tarry Kos, MD;  Location: [facility];  Service: Orthopedics;  Laterality: Right;   TRANSMETATARSAL AMPUTATION Left    TRIGGER FINGER RELEASE Right  09/16/2019   Procedure: RELEASE TRIGGER FINGER/A-1 PULLEY;  Surgeon: Cindee Salt, MD;  Location: [facility];  Service: Orthopedics;  Laterality: Right;  IV REGIONAL FOREARM BLOCK   Social History   Occupational History   Occupation: IT trainer taxes  Tobacco Use   Smoking status: Never   Smokeless tobacco: Never  Substance and Sexual Activity   Alcohol use: No   Drug use: No   Sexual activity: Yes

## 2022-10-30 ENCOUNTER — Ambulatory Visit (HOSPITAL_BASED_OUTPATIENT_CLINIC_OR_DEPARTMENT_OTHER): Payer: Medicare Other | Admitting: Physical Therapy

## 2022-10-30 ENCOUNTER — Encounter (HOSPITAL_BASED_OUTPATIENT_CLINIC_OR_DEPARTMENT_OTHER): Payer: Self-pay | Admitting: Physical Therapy

## 2022-10-30 DIAGNOSIS — M542 Cervicalgia: Secondary | ICD-10-CM | POA: Diagnosis not present

## 2022-10-30 DIAGNOSIS — R29898 Other symptoms and signs involving the musculoskeletal system: Secondary | ICD-10-CM | POA: Diagnosis not present

## 2022-10-30 NOTE — Therapy (Signed)
OUTPATIENT PHYSICAL THERAPY CERVICAL Treatment   Patient Name: LEAF SELLS MRN: [id number] DOB:05-09-1950, 72 y.o., male Today's Date: 10/30/2022  END OF SESSION:  PT End of Session - 10/30/22 1051     Visit Number 2    Number of Visits 12    Date for PT Re-Evaluation 12/06/22    Authorization Type Medicare    Progress Note Due on Visit 10    PT Start Time 1050    PT Stop Time 1132    PT Time Calculation (min) 42 min    Activity Tolerance Patient tolerated treatment well    Behavior During Therapy WFL for tasks assessed/performed              Past Medical History:  Diagnosis Date   Anemia    low iron   Arthritis    Coronary artery disease 2006   a. remote stenting to ramus, prox LAD x2.   Dental crowns present    Diabetes mellitus    First degree AV block    GERD (gastroesophageal reflux disease)    Heart attack (HCC) 06/2004   Heart murmur    aortic   Hx MRSA infection 02/2006   Hyperlipidemia    Hypertension    hx. of - has not been on med. since losing wt. after gastric bypass   Hypothyroidism    Morbid obesity (HCC)    Mucoid cyst of joint 09/2011   left thumb   Sleep apnea    sleep study 03/29/2011; no CPAP use, lost >130lbs   Trifascicular block    Past Surgical History:  Procedure Laterality Date   AMPUTATION Left 04/19/2016   Procedure: Left Foot 4th Toe Amputation vs. Transmetatarsal;  Surgeon: Nadara Mustard, MD;  Location: [facility];  Service: Orthopedics;  Laterality: Left;   BIOPSY  04/07/2019   Procedure: BIOPSY;  Surgeon: Kathi Der, MD;  [facility]: [facility];  Service: Gastroenterology;;   CATARACT EXTRACTION  02/2010; 03/2010   COLONOSCOPY WITH PROPOFOL N/A 09/14/2014   Procedure: COLONOSCOPY WITH PROPOFOL;  Surgeon: Charolett Bumpers, MD;  [facility];  Service: Endoscopy;  Laterality: N/A;   COLONOSCOPY WITH PROPOFOL N/A 04/07/2019   Procedure: COLONOSCOPY WITH PROPOFOL;  Surgeon: Kathi Der, MD;  [facility];   Service: Gastroenterology;  Laterality: N/A;   CORONARY ANGIOPLASTY WITH STENT PLACEMENT  07/12/2004; 08/03/2004   total of 3 stents   CORONARY STENT INTERVENTION N/A 08/23/2022   Procedure: CORONARY STENT INTERVENTION;  Surgeon: Swaziland, Peter M, MD;  [facility];  Service: Cardiovascular;  Laterality: N/A;   I & D EXTREMITY Right 06/04/2014   Procedure: IRRIGATION AND DEBRIDEMENT EXTREMITY;  Surgeon: Kathryne Hitch, MD;  Location: [facility] OR;  Service: Orthopedics;  Laterality: Right;   LEFT HEART CATH AND CORONARY ANGIOGRAPHY N/A 08/23/2022   Procedure: LEFT HEART CATH AND CORONARY ANGIOGRAPHY;  Surgeon: Swaziland, Peter M, MD;  Location: [facility] INVASIVE CV LAB;  Service: Cardiovascular;  Laterality: N/A;   MASS EXCISION  10/04/2011   Procedure: EXCISION MASS;  Surgeon: Nicki Reaper, MD;  Location: [facility];  Service: Orthopedics;  Laterality: Left;  excision cyst debridment ip joint of left thumb   PROSTATECTOMY     right below knee amputation  2018   ROUX-EN-Y GASTRIC BYPASS  10/10/2010   laparoscopic   SHOULDER ARTHROSCOPY WITH ROTATOR CUFF REPAIR AND SUBACROMIAL DECOMPRESSION Right 03/23/2020   Procedure: RIGHT SHOULDER ARTHROSCOPY WITH DEBRIDEMENT AND  ROTATOR CUFF REPAIR;  Surgeon: Magnus Ivan,  Vanita Panda, MD;  Location: [facility] OR;  Service: Orthopedics;  Laterality: Right;   TOE AMPUTATION  02/23/2006   left foot second ray amputation   TOTAL HIP ARTHROPLASTY Right 03/29/2020   Procedure: TOTAL HIP ARTHROPLASTY POSTERIOR APPROACH;  Surgeon: Tarry Kos, MD;  Location: [facility];  Service: Orthopedics;  Laterality: Right;   TRANSMETATARSAL AMPUTATION Left    TRIGGER FINGER RELEASE Right 09/16/2019   Procedure: RELEASE TRIGGER FINGER/A-1 PULLEY;  Surgeon: Cindee Salt, MD;  Location:  SURGERY CENTER;  Service: Orthopedics;  Laterality: Right;  IV REGIONAL FOREARM BLOCK   Patient Active Problem List   Diagnosis Date Noted   Paroxysmal atrial fibrillation with RVR  (HCC) 08/23/2022   Ascending aortic aneurysm (HCC) 08/23/2022   Bradycardia 08/22/2022   NSTEMI (non-ST elevated myocardial infarction) (HCC) 08/22/2022   Atrial fibrillation with RVR (HCC) 08/22/2022   Hypotension 01/07/2021   S/P arthroscopy of right shoulder 06/02/2020   Closed fracture of neck of right femur (HCC)    Hip fracture (HCC) 03/27/2020   Nontraumatic complete tear of right rotator cuff 02/26/2020   S/P transmetatarsal amputation of foot, left (HCC) 04/25/2016   Chronic osteomyelitis of toe, left (HCC)    Chronic diastolic CHF (congestive heart failure) (HCC) 09/25/2015   Aortic stenosis 08/04/2014   Diabetic foot infection (HCC) 06/03/2014   Peripheral vascular disease (HCC) 06/03/2014   Coronary artery disease involving native coronary artery of native heart without angina pectoris 07/29/2013   Trifascicular block 07/29/2013   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 07/26/2011   Hypothyroidism 01/22/2007   Type 2 diabetes mellitus with vascular disease (HCC) 01/22/2007   HYPERLIPIDEMIA, MIXED 01/22/2007   MYOCARDIAL INFARCTION, HX OF 01/22/2007   ANEMIA, IRON DEFICIENCY, HX OF 01/22/2007    PCP: Hillard Danker MD  REFERRING PROVIDER: Kirtland Bouchard, PA-C  REFERRING DIAG: M54.2 (ICD-10-CM) - Cervicalgia  THERAPY DIAG:  Cervicalgia  Other symptoms and signs involving the musculoskeletal system  Rationale for Evaluation and Treatment: Rehabilitation  ONSET DATE: 2.5-3 months  SUBJECTIVE:                                                                                                                                                                                                         SUBJECTIVE STATEMENT: Reached to the left to grab my drink last night and felt a sharp pain at cervical region on left side.   PERTINENT HISTORY:  Afib, hx NSTEMI, hx transmetatarsal amputation L, R BKA, CHF, PVD, HLD, Ascending aortic aneurysm, DM  PAIN:  Are you  having pain?  Yes: NPRS scale: 4/10 Pain location: L cervical spine Pain description: sharp Aggravating factors: L rotation Relieving factors: rest  PRECAUTIONS: None  WEIGHT BEARING RESTRICTIONS: No  FALLS:  Has patient fallen in last 6 months? No  PLOF: Independent  PATIENT GOALS: turn to left and not have pain   OBJECTIVE: (objective measures from initial evaluation unless otherwise dated)  DIAGNOSTIC FINDINGS:  XR 09/14/22: Cervical spine 3 views: Shows normal lordotic curvature.  Disc base overall well-maintained.  No acute fractures no spondylolisthesis.  Minimal arthritic changes.   PATIENT SURVEYS:  FOTO 56% function  COGNITION: Overall cognitive status: Within functional limits for tasks assessed  SENSATION: WFL  POSTURE: rounded shoulders, forward head, and increased thoracic kyphosis  PALPATION: TTP bilateral UT, concordant on L, minimal to no tenderness throughout rest of cervical spine; hypomobile and lordotic c/sp  CERVICAL ROM:   Active ROM A/PROM (deg) eval  Flexion 27  Extension 40  Right lateral flexion 30 *  Left lateral flexion 23 *  Right rotation 58  Left rotation 59 *   (Blank rows = not tested) *=pain/symptoms  UPPER EXTREMITY ROM: WFL for tasks assessed  Active ROM Right eval Left eval  Shoulder flexion    Shoulder extension    Shoulder abduction    Shoulder adduction    Shoulder extension    Shoulder internal rotation    Shoulder external rotation    Elbow flexion    Elbow extension    Wrist flexion    Wrist extension    Wrist ulnar deviation    Wrist radial deviation    Wrist pronation    Wrist supination     (Blank rows = not tested) *=pain/symptoms  UPPER EXTREMITY MMT:  MMT Right eval Left eval  Shoulder flexion 4+ 4+  Shoulder extension    Shoulder abduction 4+ 4+  Shoulder adduction    Shoulder extension    Shoulder internal rotation    Shoulder external rotation    Middle trapezius    Lower trapezius    Elbow  flexion 5 5  Elbow extension 5 5  Wrist flexion    Wrist extension    Wrist ulnar deviation    Wrist radial deviation    Wrist pronation    Wrist supination    Grip strength     (Blank rows = not tested) *=pain/symptoms  CERVICAL SPECIAL TESTS:  Distraction test: Negative    TODAY'S TREATMENT:                                                                                                                              Treatment                            10/30/22:  Manual: STM bil upper traps, lateral glides C3 through T2, cervical distraction, distraction+suboccipital release, distraction+cervical rotation, bil first rib depression paired with cervical sidebend.  Supine retraction; tuck+lift <5s hold available today Supine chin tuck+horiz abd stretch UEs- 3 breath holds Supine pec stretch, horiz abd red tband- all with chin tuck Seated ER green tband Rotation SNAG Added sheet to upper trap stretch for first rib mobilization/depression   DATE:  10/25/22  Seated scap retraction 2 x 10 Seated UT stretch 3 x 30 second holds L  PATIENT EDUCATION:  Education details: Patient educated on exam findings, POC, scope of PT, HEP, and dry needling. Person educated: Patient Education method: Explanation, Demonstration, and Handouts Education comprehension: verbalized understanding, returned demonstration, verbal cues required, and tactile cues required  HOME EXERCISE PROGRAM: Access Code: [email or web address] URL: [email or web address]    ASSESSMENT:  CLINICAL IMPRESSION: Limited C6/7/T1 rotational & lateral glide mobility mobility. Multiple cues for retraction which was difficult but pt did demo increased rotation ROM by end of session.   OBJECTIVE IMPAIRMENTS: decreased activity tolerance, decreased endurance, decreased mobility, decreased ROM, decreased strength, hypomobility, increased muscle spasms, impaired flexibility, impaired UE functional use, improper body mechanics,  postural dysfunction, and pain.   ACTIVITY LIMITATIONS: carrying, lifting, bending, reach over head, and caring for others  PARTICIPATION LIMITATIONS: meal prep, cleaning, laundry, driving, shopping, community activity, and yard work  PERSONAL FACTORS: 3+ comorbidities: Afib, hx NSTEMI, hx transmetatarsal, amputation L, CHF, PVD, HLD, Ascending aortic aneurysm, DM  are also affecting patient's functional outcome.   REHAB POTENTIAL: Good  CLINICAL DECISION MAKING: Stable/uncomplicated  EVALUATION COMPLEXITY: Low   GOALS: Goals reviewed with patient? Yes  SHORT TERM GOALS: Target date: 11/15/2022    Patient will be independent with HEP in order to improve functional outcomes. Baseline: Goal status: INITIAL  2.  Patient will report at least 25% improvement in symptoms for improved quality of life. Baseline:  Goal status: INITIAL    LONG TERM GOALS: Target date: 12/06/2022    Patient will report at least 75% improvement in symptoms for improved quality of life. Baseline:  Goal status: INITIAL  2.  Patient will improve FOTO score by at least 11 points in order to indicate improved tolerance to activity. Baseline: 56% function Goal status: INITIAL  3.  Patient will demonstrate at least 25% improvement in cervical ROM in all restricted planes for improved ability to move head while driving. Baseline: see above Goal status: INITIAL  4.  Patient will be able to return to all activities unrestricted for improved ability to perform work functions and participate with family.  Baseline:  Goal status: INITIAL  5.  Patient will demonstrate grade of 5/5 MMT grade in all tested musculature as evidence of improved strength to assist with lifting at home. Baseline: see above Goal status: INITIAL      PLAN:  PT FREQUENCY: 2x/week  PT DURATION: 6 weeks  PLANNED INTERVENTIONS: Therapeutic exercises, Therapeutic activity, Neuromuscular re-education, Balance training, Gait  training, Patient/Family education, Joint manipulation, Joint mobilization, Stair training, Orthotic/Fit training, DME instructions, Aquatic Therapy, Dry Needling, Electrical stimulation, Spinal manipulation, Spinal mobilization, Cryotherapy, Moist heat, Compression bandaging, scar mobilization, Splintting, Taping, Traction, Ultrasound, Ionotophoresis 4mg /ml Dexamethasone, and Manual therapy  PLAN FOR NEXT SESSION: f/u with HEP; possibly DN, postural strength, cervical mobility   Jynesis Nakamura C. Mikyle Sox PT, DPT 10/30/22 11:35 AM

## 2022-10-31 ENCOUNTER — Encounter: Payer: Self-pay | Admitting: Cardiology

## 2022-11-01 ENCOUNTER — Other Ambulatory Visit: Payer: Self-pay | Admitting: Orthopedic Surgery

## 2022-11-02 ENCOUNTER — Encounter: Payer: Self-pay | Admitting: Orthopedic Surgery

## 2022-11-02 ENCOUNTER — Ambulatory Visit: Payer: Medicare Other | Admitting: Orthopedic Surgery

## 2022-11-02 DIAGNOSIS — Z89432 Acquired absence of left foot: Secondary | ICD-10-CM | POA: Diagnosis not present

## 2022-11-02 DIAGNOSIS — L97521 Non-pressure chronic ulcer of other part of left foot limited to breakdown of skin: Secondary | ICD-10-CM | POA: Diagnosis not present

## 2022-11-02 NOTE — Progress Notes (Signed)
Office Visit Note   Patient: [name]           Date of Birth: 1951-01-22           MRN: [id number] Visit Date: 11/02/2022              Requested by: Thana Ates, MD [address] [place],  [place] [address] PCP: Thana Ates, MD  Chief Complaint  Patient presents with   Left Foot - Wound Check      HPI: Patient is a 72 year old gentleman who presents in follow-up status post application of Kerecis micro tissue graft October 1.  Assessment & Plan: Visit Diagnoses:  1. History of transmetatarsal amputation of left foot (HCC)   2. Non-pressure chronic ulcer of other part of left foot limited to breakdown of skin (HCC)     Plan: Patient will continue with Dial soap cleansing dry dressing changes.  Follow-Up Instructions: Return in about 2 weeks (around 11/16/2022).   Ortho Exam  Patient is alert, oriented, no adenopathy, well-dressed, normal affect, normal respiratory effort. Examination patient has had excellent improvement in the wound diameter and wound depth.  There is maceration around the wound however the wound is now 5 mm in diameter and 2 mm deep.  After debridement there is healthy granulation tissue at the wound bed.  Imaging: No results found. No images are attached to the encounter.  Labs: Lab Results  Component Value Date   HGBA1C 6.7 (H) 08/24/2022   HGBA1C 6.8 (H) 03/28/2020   HGBA1C 6.5 (H) 09/26/2015   REPTSTATUS 08/01/2016 FINAL 07/30/2016   GRAMSTAIN  06/04/2014    MODERATE WBC PRESENT, PREDOMINANTLY PMN NO SQUAMOUS EPITHELIAL CELLS SEEN MODERATE GRAM POSITIVE COCCI IN CLUSTERS Performed at [facility]    GRAMSTAIN  06/04/2014    MODERATE WBC PRESENT, PREDOMINANTLY PMN NO SQUAMOUS EPITHELIAL CELLS SEEN MODERATE GRAM POSITIVE COCCI IN PAIRS Performed at [facility]    CULT  07/30/2016    NO GROWTH Performed at [facility], [address], [place], [place] [address]      Lab Results   Component Value Date   ALBUMIN 3.2 (L) 08/24/2022   ALBUMIN 3.4 (L) 08/23/2022   ALBUMIN 4.1 08/22/2022    Lab Results  Component Value Date   MG 1.8 08/22/2022   MG 1.8 08/08/2021   MG 2.0 04/02/2020   Lab Results  Component Value Date   VD25OH 53.03 03/28/2020    No results found for: "PREALBUMIN"    Latest Ref Rng & Units 08/24/2022   12:58 AM 08/23/2022    3:49 AM 08/22/2022    3:57 PM  CBC EXTENDED  WBC 4.0 - 10.5 K/uL 6.4  5.3  9.5   RBC 4.22 - 5.81 MIL/uL 4.15  4.05  4.85   Hemoglobin 13.0 - 17.0 g/dL 43.3  29.5  18.8   HCT 39.0 - 52.0 % 37.5  38.0  43.8   Platelets 150 - 400 K/uL 187  163  236      There is no height or weight on file to calculate BMI.  Orders:  No orders of the defined types were placed in this encounter.  No orders of the defined types were placed in this encounter.    Procedures: No procedures performed  Clinical Data: No additional findings.  ROS:  All other systems negative, except as noted in the HPI. Review of Systems  Objective: Vital Signs: There were no vitals  taken for this visit.  Specialty Comments:  No specialty comments available.  PMFS History: Patient Active Problem List   Diagnosis Date Noted   Paroxysmal atrial fibrillation with RVR (HCC) 08/23/2022   Ascending aortic aneurysm (HCC) 08/23/2022   Bradycardia 08/22/2022   NSTEMI (non-ST elevated myocardial infarction) (HCC) 08/22/2022   Atrial fibrillation with RVR (HCC) 08/22/2022   Hypotension 01/07/2021   S/P arthroscopy of right shoulder 06/02/2020   Closed fracture of neck of right femur (HCC)    Hip fracture (HCC) 03/27/2020   Nontraumatic complete tear of right rotator cuff 02/26/2020   S/P transmetatarsal amputation of foot, left (HCC) 04/25/2016   Chronic osteomyelitis of toe, left (HCC)    Chronic diastolic CHF (congestive heart failure) (HCC) 09/25/2015   Aortic stenosis 08/04/2014   Diabetic foot infection (HCC) 06/03/2014   Peripheral  vascular disease (HCC) 06/03/2014   Coronary artery disease involving native coronary artery of native heart without angina pectoris 07/29/2013   Trifascicular block 07/29/2013   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 remote stenting to ramus, prox LAD x2.   Dental crowns present    Diabetes mellitus    First degree AV block    GERD (gastroesophageal reflux disease)    Heart attack (HCC) 06/2004   Heart murmur    aortic   Hx MRSA infection 02/2006   Hyperlipidemia    Hypertension    hx. of - has not been on med. since losing wt. after gastric bypass   Hypothyroidism    Morbid obesity (HCC)    Mucoid cyst of joint 09/2011   left thumb   Sleep apnea    sleep study 03/29/2011; no CPAP use, lost >130lbs   Trifascicular block     Family History  Problem Relation Age of Onset   Heart disease Mother     Past Surgical History:  Procedure Laterality Date   AMPUTATION Left 04/19/2016   Procedure: Left Foot 4th Toe Amputation vs. Transmetatarsal;  Surgeon: Nadara Mustard, MD;  Location: [facility];  Service: Orthopedics;  Laterality: Left;   BIOPSY  04/07/2019   Procedure: BIOPSY;  Surgeon: Kathi Der, MD;  Location: [facility];  Service: Gastroenterology;;   CATARACT EXTRACTION  02/2010; 03/2010   COLONOSCOPY WITH PROPOFOL N/A 09/14/2014   Procedure: COLONOSCOPY WITH PROPOFOL;  Surgeon: Charolett Bumpers, MD;  Location: [facility];  Service: Endoscopy;  Laterality: N/A;   COLONOSCOPY WITH PROPOFOL N/A 04/07/2019   Procedure: COLONOSCOPY WITH PROPOFOL;  Surgeon: Kathi Der, MD;  Location: [facility];  Service: Gastroenterology;  Laterality: N/A;    CORONARY ANGIOPLASTY WITH STENT PLACEMENT  07/12/2004; 08/03/2004   total of 3 stents   CORONARY STENT INTERVENTION N/A 08/23/2022   Procedure: CORONARY STENT INTERVENTION;  Surgeon: Swaziland, Peter M, MD;  Location: [facility] INVASIVE CV LAB;  Service: Cardiovascular;  Laterality: N/A;   I & D EXTREMITY Right 06/04/2014   Procedure: IRRIGATION AND DEBRIDEMENT EXTREMITY;  Surgeon: Kathryne Hitch, MD;  Location: [facility] OR;  Service: Orthopedics;  Laterality: Right;   LEFT HEART CATH AND CORONARY ANGIOGRAPHY N/A 08/23/2022   Procedure: LEFT HEART CATH  AND CORONARY ANGIOGRAPHY;  Surgeon: Swaziland, Peter M, MD;  Location: [facility] INVASIVE CV LAB;  Service: Cardiovascular;  Laterality: N/A;   MASS EXCISION  10/04/2011   Procedure: EXCISION MASS;  Surgeon: Nicki Reaper, MD;  Location: [facility];  Service: Orthopedics;  Laterality: Left;  excision cyst debridment ip joint of left thumb   PROSTATECTOMY     right below knee amputation  2018   ROUX-EN-Y GASTRIC BYPASS  10/10/2010   laparoscopic   SHOULDER ARTHROSCOPY WITH ROTATOR CUFF REPAIR AND SUBACROMIAL DECOMPRESSION Right 03/23/2020   Procedure: RIGHT SHOULDER ARTHROSCOPY WITH DEBRIDEMENT AND  ROTATOR CUFF REPAIR;  Surgeon: Kathryne Hitch, MD;  Location: [facility];  Service: Orthopedics;  Laterality: Right;   TOE AMPUTATION  02/23/2006   left foot second ray amputation   TOTAL HIP ARTHROPLASTY Right 03/29/2020   Procedure: TOTAL HIP ARTHROPLASTY POSTERIOR APPROACH;  Surgeon: Tarry Kos, MD;  Location: [facility];  Service: Orthopedics;  Laterality: Right;   TRANSMETATARSAL AMPUTATION Left    TRIGGER FINGER RELEASE Right 09/16/2019   Procedure: RELEASE TRIGGER FINGER/A-1 PULLEY;  Surgeon: Cindee Salt, MD;  Location:  SURGERY CENTER;  Service: Orthopedics;  Laterality: Right;  IV REGIONAL FOREARM BLOCK   Social History   Occupational History   Occupation: IT trainer taxes  Tobacco Use   Smoking status: Never   Smokeless tobacco: Never   Substance and Sexual Activity   Alcohol use: No   Drug use: No   Sexual activity: Yes

## 2022-11-04 DIAGNOSIS — Z23 Encounter for immunization: Secondary | ICD-10-CM | POA: Diagnosis not present

## 2022-11-07 ENCOUNTER — Encounter (HOSPITAL_BASED_OUTPATIENT_CLINIC_OR_DEPARTMENT_OTHER): Payer: Self-pay | Admitting: Physical Therapy

## 2022-11-07 ENCOUNTER — Ambulatory Visit (HOSPITAL_BASED_OUTPATIENT_CLINIC_OR_DEPARTMENT_OTHER): Payer: Medicare Other | Admitting: Physical Therapy

## 2022-11-07 DIAGNOSIS — R29898 Other symptoms and signs involving the musculoskeletal system: Secondary | ICD-10-CM

## 2022-11-07 DIAGNOSIS — M542 Cervicalgia: Secondary | ICD-10-CM | POA: Diagnosis not present

## 2022-11-07 NOTE — Therapy (Signed)
OUTPATIENT PHYSICAL THERAPY CERVICAL Treatment   Patient Name: [name] MRN: [id number] DOB:1950/07/11, 72 y.o., male Today's Date: 11/07/2022  END OF SESSION:     Past Medical History:  Diagnosis Date   Anemia    low iron   Arthritis    Coronary artery disease 2006   a. remote stenting to ramus, prox LAD x2.   Dental crowns present    Diabetes mellitus    First degree AV block    GERD (gastroesophageal reflux disease)    Heart attack (HCC) 06/2004   Heart murmur    aortic   Hx MRSA infection 02/2006   Hyperlipidemia    Hypertension    hx. of - has not been on med. since losing wt. after gastric bypass   Hypothyroidism    Morbid obesity (HCC)    Mucoid cyst of joint 09/2011   left thumb   Sleep apnea    sleep study 03/29/2011; no CPAP use, lost >130lbs   Trifascicular block    Past Surgical History:  Procedure Laterality Date   AMPUTATION Left 04/19/2016   Procedure: Left Foot 4th Toe Amputation vs. Transmetatarsal;  Surgeon: Nadara Mustard, MD;  Location: [facility];  Service: Orthopedics;  Laterality: Left;   BIOPSY  04/07/2019   Procedure: BIOPSY;  Surgeon: Kathi Der, MD;  [facility];  Service: Gastroenterology;;   CATARACT EXTRACTION  02/2010; 03/2010   COLONOSCOPY WITH PROPOFOL N/A 09/14/2014   Procedure: COLONOSCOPY WITH PROPOFOL;  Surgeon: Charolett Bumpers, MD;  [facility];  Service: Endoscopy;  Laterality: N/A;   COLONOSCOPY WITH PROPOFOL N/A 04/07/2019   Procedure: COLONOSCOPY WITH PROPOFOL;  Surgeon: Kathi Der, MD;  [facility];  Service: Gastroenterology;  Laterality: N/A;   CORONARY ANGIOPLASTY WITH STENT PLACEMENT  07/12/2004; 08/03/2004   total of 3 stents   CORONARY STENT INTERVENTION N/A 08/23/2022   Procedure: CORONARY STENT INTERVENTION;  Surgeon: Swaziland, Peter M, MD;  Location: [facility] INVASIVE CV LAB;  Service: Cardiovascular;  Laterality: N/A;   I & D EXTREMITY Right 06/04/2014   Procedure: IRRIGATION AND  DEBRIDEMENT EXTREMITY;  Surgeon: Kathryne Hitch, MD;  Location: [facility] OR;  Service: Orthopedics;  Laterality: Right;   LEFT HEART CATH AND CORONARY ANGIOGRAPHY N/A 08/23/2022   Procedure: LEFT HEART CATH AND CORONARY ANGIOGRAPHY;  Surgeon: Swaziland, Peter M, MD;  Location: [facility] INVASIVE CV LAB;  Service: Cardiovascular;  Laterality: N/A;   MASS EXCISION  10/04/2011   Procedure: EXCISION MASS;  Surgeon: Nicki Reaper, MD;  Location: [facility];  Service: Orthopedics;  Laterality: Left;  excision cyst debridment ip joint of left thumb   PROSTATECTOMY     right below knee amputation  2018   ROUX-EN-Y GASTRIC BYPASS  10/10/2010   laparoscopic   SHOULDER ARTHROSCOPY WITH ROTATOR CUFF REPAIR AND SUBACROMIAL DECOMPRESSION Right 03/23/2020   Procedure: RIGHT SHOULDER ARTHROSCOPY WITH DEBRIDEMENT AND  ROTATOR CUFF REPAIR;  Surgeon: Kathryne Hitch, MD;  Location: [facility];  Service: Orthopedics;  Laterality: Right;   TOE AMPUTATION  02/23/2006   left foot second ray amputation   TOTAL HIP ARTHROPLASTY Right 03/29/2020   Procedure: TOTAL HIP ARTHROPLASTY POSTERIOR APPROACH;  Surgeon: Tarry Kos, MD;  Location: [facility];  Service: Orthopedics;  Laterality: Right;   TRANSMETATARSAL AMPUTATION Left    TRIGGER FINGER RELEASE Right 09/16/2019   Procedure: RELEASE TRIGGER FINGER/A-1 PULLEY;  Surgeon: Cindee Salt, MD;  Location: [facility];  Service: Orthopedics;  Laterality: Right;  IV  REGIONAL FOREARM BLOCK   Patient Active Problem List   Diagnosis Date Noted   Paroxysmal atrial fibrillation with RVR (HCC) 08/23/2022   Ascending aortic aneurysm (HCC) 08/23/2022   Bradycardia 08/22/2022   NSTEMI (non-ST elevated myocardial infarction) (HCC) 08/22/2022   Atrial fibrillation with RVR (HCC) 08/22/2022   Hypotension 01/07/2021   S/P arthroscopy of right shoulder 06/02/2020   Closed fracture of neck of right femur (HCC)    Hip fracture (HCC) 03/27/2020   Nontraumatic complete tear  of right rotator cuff 02/26/2020   S/P transmetatarsal amputation of foot, left (HCC) 04/25/2016   Chronic osteomyelitis of toe, left (HCC)    Chronic diastolic CHF (congestive heart failure) (HCC) 09/25/2015   Aortic stenosis 08/04/2014   Diabetic foot infection (HCC) 06/03/2014   Peripheral vascular disease (HCC) 06/03/2014   Coronary artery disease involving native coronary artery of native heart without angina pectoris 07/29/2013   Trifascicular block 07/29/2013   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 07/26/2011   Hypothyroidism 01/22/2007   Type 2 diabetes mellitus with vascular disease (HCC) 01/22/2007   HYPERLIPIDEMIA, MIXED 01/22/2007   MYOCARDIAL INFARCTION, HX OF 01/22/2007   ANEMIA, IRON DEFICIENCY, HX OF 01/22/2007    PCP: Hillard Danker MD  REFERRING PROVIDER: Kirtland Bouchard, PA-C  REFERRING DIAG: M54.2 (ICD-10-CM) - Cervicalgia  THERAPY DIAG:  No diagnosis found.  Rationale for Evaluation and Treatment: Rehabilitation  ONSET DATE: 2.5-3 months  SUBJECTIVE:                                                                                                                                                                                                         SUBJECTIVE STATEMENT: Reached to the left to grab my drink last night and felt a sharp pain at cervical region on left side.   PERTINENT HISTORY:  Afib, hx NSTEMI, hx transmetatarsal amputation L, R BKA, CHF, PVD, HLD, Ascending aortic aneurysm, DM  PAIN:  Are you having pain? Yes: NPRS scale: 4/10 Pain location: L cervical spine Pain description: sharp Aggravating factors: L rotation Relieving factors: rest  PRECAUTIONS: None  WEIGHT BEARING RESTRICTIONS: No  FALLS:  Has patient fallen in last 6 months? No  PLOF: Independent  PATIENT GOALS: turn to left and not have pain   OBJECTIVE: (objective measures from initial evaluation unless otherwise dated)  DIAGNOSTIC FINDINGS:  XR 09/14/22: Cervical  spine 3 views: Shows normal lordotic curvature.  Disc base overall well-maintained.  No acute fractures no spondylolisthesis.  Minimal arthritic changes.   PATIENT SURVEYS:  FOTO 56% function  COGNITION: Overall cognitive status: Within functional limits for tasks assessed  SENSATION: WFL  POSTURE: rounded shoulders, forward head, and increased thoracic kyphosis  PALPATION: TTP bilateral UT, concordant on L, minimal to no tenderness throughout rest of cervical spine; hypomobile and lordotic c/sp  CERVICAL ROM:   Active ROM A/PROM (deg) eval  Flexion 27  Extension 40  Right lateral flexion 30 *  Left lateral flexion 23 *  Right rotation 58  Left rotation 59 *   (Blank rows = not tested) *=pain/symptoms  UPPER EXTREMITY ROM: WFL for tasks assessed  Active ROM Right eval Left eval  Shoulder flexion    Shoulder extension    Shoulder abduction    Shoulder adduction    Shoulder extension    Shoulder internal rotation    Shoulder external rotation    Elbow flexion    Elbow extension    Wrist flexion    Wrist extension    Wrist ulnar deviation    Wrist radial deviation    Wrist pronation    Wrist supination     (Blank rows = not tested) *=pain/symptoms  UPPER EXTREMITY MMT:  MMT Right eval Left eval  Shoulder flexion 4+ 4+  Shoulder extension    Shoulder abduction 4+ 4+  Shoulder adduction    Shoulder extension    Shoulder internal rotation    Shoulder external rotation    Middle trapezius    Lower trapezius    Elbow flexion 5 5  Elbow extension 5 5  Wrist flexion    Wrist extension    Wrist ulnar deviation    Wrist radial deviation    Wrist pronation    Wrist supination    Grip strength     (Blank rows = not tested) *=pain/symptoms  CERVICAL SPECIAL TESTS:  Distraction test: Negative    TODAY'S TREATMENT:                                                                                                                               Trigger Point  Dry-Needling  Treatment instructions: Expect mild to moderate muscle soreness. S/S of pneumothorax if dry needled over a lung field, and to seek immediate medical attention should they occur. Patient verbalized understanding of these instructions and education.  Patient Consent Given: Yes Education handout provided: Yes Muscles treated: upper trap 3x using .30x50 needle  Electrical stimulation performed: No Parameters: N/A Treatment response/outcome: great twitch   Manual: reviewed use of thera-cane for self trigger point release. Skilled palpation of trigger points. Sub-occipital release. Gentle manual traction. Thoracici PA gross mobilization   Reviewed upper trap stretch 3x20 sec hold  Cervical rotation 3x to each side pain free  Row red 2x15   Therapy reviewed books stands he can use in his chair. Flexion causes exacerbated pain.    Treatment  10/30/22:  Manual: STM bil upper traps, lateral glides C3 through T2, cervical distraction, distraction+suboccipital release, distraction+cervical rotation, bil first rib depression paired with cervical sidebend.  Supine retraction; tuck+lift <5s hold available today Supine chin tuck+horiz abd stretch UEs- 3 breath holds Supine pec stretch, horiz abd red tband- all with chin tuck Seated ER green tband Rotation SNAG Added sheet to upper trap stretch for first rib mobilization/depression   DATE:  10/25/22  Seated scap retraction 2 x 10 Seated UT stretch 3 x 30 second holds L  PATIENT EDUCATION:  Education details: Patient educated on exam findings, POC, scope of PT, HEP, and dry needling. Person educated: Patient Education method: Explanation, Demonstration, and Handouts Education comprehension: verbalized understanding, returned demonstration, verbal cues required, and tactile cues required  HOME EXERCISE PROGRAM: Access Code: [email or web address] URL: [email or web address]    ASSESSMENT:  CLINICAL  IMPRESSION: The patient had a great twitch with needling. He had a significant improvement in rotation following. We reviewed his HEP. We reviewed stretches vs exercises and how to use at home. He was advised to narrow his stretches down to his most effective 2 -3. We also worked on manual therapy to his neck and upper trap. We will continue to progress exercises as tolerated.   OBJECTIVE IMPAIRMENTS: decreased activity tolerance, decreased endurance, decreased mobility, decreased ROM, decreased strength, hypomobility, increased muscle spasms, impaired flexibility, impaired UE functional use, improper body mechanics, postural dysfunction, and pain.   ACTIVITY LIMITATIONS: carrying, lifting, bending, reach over head, and caring for others  PARTICIPATION LIMITATIONS: meal prep, cleaning, laundry, driving, shopping, community activity, and yard work  PERSONAL FACTORS: 3+ comorbidities: Afib, hx NSTEMI, hx transmetatarsal, amputation L, CHF, PVD, HLD, Ascending aortic aneurysm, DM  are also affecting patient's functional outcome.   REHAB POTENTIAL: Good  CLINICAL DECISION MAKING: Stable/uncomplicated  EVALUATION COMPLEXITY: Low   GOALS: Goals reviewed with patient? Yes  SHORT TERM GOALS: Target date: 11/15/2022    Patient will be independent with HEP in order to improve functional outcomes. Baseline: Goal status: INITIAL  2.  Patient will report at least 25% improvement in symptoms for improved quality of life. Baseline:  Goal status: INITIAL    LONG TERM GOALS: Target date: 12/06/2022    Patient will report at least 75% improvement in symptoms for improved quality of life. Baseline:  Goal status: INITIAL  2.  Patient will improve FOTO score by at least 11 points in order to indicate improved tolerance to activity. Baseline: 56% function Goal status: INITIAL  3.  Patient will demonstrate at least 25% improvement in cervical ROM in all restricted planes for improved ability to  move head while driving. Baseline: see above Goal status: INITIAL  4.  Patient will be able to return to all activities unrestricted for improved ability to perform work functions and participate with family.  Baseline:  Goal status: INITIAL  5.  Patient will demonstrate grade of 5/5 MMT grade in all tested musculature as evidence of improved strength to assist with lifting at home. Baseline: see above Goal status: INITIAL      PLAN:  PT FREQUENCY: 2x/week  PT DURATION: 6 weeks  PLANNED INTERVENTIONS: Therapeutic exercises, Therapeutic activity, Neuromuscular re-education, Balance training, Gait training, Patient/Family education, Joint manipulation, Joint mobilization, Stair training, Orthotic/Fit training, DME instructions, Aquatic Therapy, Dry Needling, Electrical stimulation, Spinal manipulation, Spinal mobilization, Cryotherapy, Moist heat, Compression bandaging, scar mobilization, Splintting, Taping, Traction, Ultrasound, Ionotophoresis 4mg /ml Dexamethasone, and Manual therapy  PLAN FOR NEXT SESSION: f/u with HEP; possibly DN, postural  strength, cervical mobility   Jessica C. Hightower PT, DPT 11/07/22 3:52 PM

## 2022-11-11 DIAGNOSIS — Z23 Encounter for immunization: Secondary | ICD-10-CM | POA: Diagnosis not present

## 2022-11-15 ENCOUNTER — Ambulatory Visit (HOSPITAL_BASED_OUTPATIENT_CLINIC_OR_DEPARTMENT_OTHER): Payer: Medicare Other | Admitting: Physical Therapy

## 2022-11-15 ENCOUNTER — Encounter (HOSPITAL_BASED_OUTPATIENT_CLINIC_OR_DEPARTMENT_OTHER): Payer: Self-pay | Admitting: Physical Therapy

## 2022-11-15 DIAGNOSIS — M542 Cervicalgia: Secondary | ICD-10-CM | POA: Diagnosis not present

## 2022-11-15 DIAGNOSIS — R29898 Other symptoms and signs involving the musculoskeletal system: Secondary | ICD-10-CM

## 2022-11-15 NOTE — Therapy (Signed)
OUTPATIENT PHYSICAL THERAPY CERVICAL Treatment   Patient Name: [name] MRN: [id number] DOB:Feb 03, 1950, 72 y.o., male Today's Date: 11/16/2022  END OF SESSION:  PT End of Session - 11/15/22 1230     Visit Number 4    Number of Visits 12    Date for PT Re-Evaluation 12/06/22    PT Start Time 1145    PT Stop Time 1228    PT Time Calculation (min) 43 min    Activity Tolerance Patient tolerated treatment well    Behavior During Therapy [facility] for tasks assessed/performed               Past Medical History:  Diagnosis Date   Anemia    low iron   Arthritis    Coronary artery disease 2006   a. remote stenting to ramus, prox LAD x2.   Dental crowns present    Diabetes mellitus    First degree AV block    GERD (gastroesophageal reflux disease)    Heart attack (HCC) 06/2004   Heart murmur    aortic   Hx MRSA infection 02/2006   Hyperlipidemia    Hypertension    hx. of - has not been on med. since losing wt. after gastric bypass   Hypothyroidism    Morbid obesity (HCC)    Mucoid cyst of joint 09/2011   left thumb   Sleep apnea    sleep study 03/29/2011; no CPAP use, lost >130lbs   Trifascicular block    Past Surgical History:  Procedure Laterality Date   AMPUTATION Left 04/19/2016   Procedure: Left Foot 4th Toe Amputation vs. Transmetatarsal;  Surgeon: Nadara Mustard, MD;  [facility];  Service: Orthopedics;  Laterality: Left;   BIOPSY  04/07/2019   Procedure: BIOPSY;  Surgeon: Kathi Der, MD;  [facility];  Service: Gastroenterology;;   CATARACT EXTRACTION  02/2010; 03/2010   COLONOSCOPY WITH PROPOFOL N/A 09/14/2014   Procedure: COLONOSCOPY WITH PROPOFOL;  Surgeon: Charolett Bumpers, MD;  [facility];  Service: Endoscopy;  Laterality: N/A;   COLONOSCOPY WITH PROPOFOL N/A 04/07/2019   Procedure: COLONOSCOPY WITH PROPOFOL;  Surgeon: Kathi Der, MD;  [facility];  Service: Gastroenterology;  Laterality: N/A;   CORONARY  ANGIOPLASTY WITH STENT PLACEMENT  07/12/2004; 08/03/2004   total of 3 stents   CORONARY STENT INTERVENTION N/A 08/23/2022   Procedure: CORONARY STENT INTERVENTION;  Surgeon: Swaziland, Peter M, MD;  Location: [facility] INVASIVE CV LAB;  Service: Cardiovascular;  Laterality: N/A;   I & D EXTREMITY Right 06/04/2014   Procedure: IRRIGATION AND DEBRIDEMENT EXTREMITY;  Surgeon: Kathryne Hitch, MD;  Location: [facility] OR;  Service: Orthopedics;  Laterality: Right;   LEFT HEART CATH AND CORONARY ANGIOGRAPHY N/A 08/23/2022   Procedure: LEFT HEART CATH AND CORONARY ANGIOGRAPHY;  Surgeon: Swaziland, Peter M, MD;  Location: [facility] INVASIVE CV LAB;  Service: Cardiovascular;  Laterality: N/A;   MASS EXCISION  10/04/2011   Procedure: EXCISION MASS;  Surgeon: Nicki Reaper, MD;  Location: [facility];  Service: Orthopedics;  Laterality: Left;  excision cyst debridment ip joint of left thumb   PROSTATECTOMY     right below knee amputation  2018   ROUX-EN-Y GASTRIC BYPASS  10/10/2010   laparoscopic   SHOULDER ARTHROSCOPY WITH ROTATOR CUFF REPAIR AND SUBACROMIAL DECOMPRESSION Right 03/23/2020   Procedure: RIGHT SHOULDER ARTHROSCOPY WITH DEBRIDEMENT AND  ROTATOR CUFF REPAIR;  Surgeon: Kathryne Hitch, MD;  [facility];  Service: Orthopedics;  Laterality: Right;  TOE AMPUTATION  02/23/2006   left foot second ray amputation   TOTAL HIP ARTHROPLASTY Right 03/29/2020   Procedure: TOTAL HIP ARTHROPLASTY POSTERIOR APPROACH;  Surgeon: Tarry Kos, MD;  [facility];  Service: Orthopedics;  Laterality: Right;   TRANSMETATARSAL AMPUTATION Left    TRIGGER FINGER RELEASE Right 09/16/2019   Procedure: RELEASE TRIGGER FINGER/A-1 PULLEY;  Surgeon: Cindee Salt, MD;  Location: [facility];  Service: Orthopedics;  Laterality: Right;  IV REGIONAL FOREARM BLOCK   Patient Active Problem List   Diagnosis Date Noted   Paroxysmal atrial fibrillation with RVR (HCC) 08/23/2022   Ascending aortic aneurysm (HCC)  08/23/2022   Bradycardia 08/22/2022   NSTEMI (non-ST elevated myocardial infarction) (HCC) 08/22/2022   Atrial fibrillation with RVR (HCC) 08/22/2022   Hypotension 01/07/2021   S/P arthroscopy of right shoulder 06/02/2020   Closed fracture of neck of right femur (HCC)    Hip fracture (HCC) 03/27/2020   Nontraumatic complete tear of right rotator cuff 02/26/2020   S/P transmetatarsal amputation of foot, left (HCC) 04/25/2016   Chronic osteomyelitis of toe, left (HCC)    Chronic diastolic CHF (congestive heart failure) (HCC) 09/25/2015   Aortic stenosis 08/04/2014   Diabetic foot infection (HCC) 06/03/2014   Peripheral vascular disease (HCC) 06/03/2014   Coronary artery disease involving native coronary artery of native heart without angina pectoris 07/29/2013   Trifascicular block 07/29/2013   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 07/26/2011   Hypothyroidism 01/22/2007   Type 2 diabetes mellitus with vascular disease (HCC) 01/22/2007   HYPERLIPIDEMIA, MIXED 01/22/2007   MYOCARDIAL INFARCTION, HX OF 01/22/2007   ANEMIA, IRON DEFICIENCY, HX OF 01/22/2007    PCP: Hillard Danker MD  REFERRING PROVIDER: Kirtland Bouchard, PA-C  REFERRING DIAG: M54.2 (ICD-10-CM) - Cervicalgia  THERAPY DIAG:  Cervicalgia  Other symptoms and signs involving the musculoskeletal system  Rationale for Evaluation and Treatment: Rehabilitation  ONSET DATE: 2.5-3 months  SUBJECTIVE:                                                                                                                                                                                                         SUBJECTIVE STATEMENT: Patient has purcashed the thing to support his book. He reports he has been better until today. He has a littl epain on the right side today.  PERTINENT HISTORY:  Afib, hx NSTEMI, hx transmetatarsal amputation L, R BKA, CHF, PVD, HLD, Ascending aortic aneurysm, DM  PAIN:  Are you having pain? Yes: NPRS  scale: 2/10 Pain location: L  cervical spine Pain description: sharp Aggravating factors: L rotation Relieving factors: rest  PRECAUTIONS: None  WEIGHT BEARING RESTRICTIONS: No  FALLS:  Has patient fallen in last 6 months? No  PLOF: Independent  PATIENT GOALS: turn to left and not have pain   OBJECTIVE: (objective measures from initial evaluation unless otherwise dated)  DIAGNOSTIC FINDINGS:  XR 09/14/22: Cervical spine 3 views: Shows normal lordotic curvature.  Disc base overall well-maintained.  No acute fractures no spondylolisthesis.  Minimal arthritic changes.   PATIENT SURVEYS:  FOTO 56% function  COGNITION: Overall cognitive status: Within functional limits for tasks assessed  SENSATION: WFL  POSTURE: rounded shoulders, forward head, and increased thoracic kyphosis  PALPATION: TTP bilateral UT, concordant on L, minimal to no tenderness throughout rest of cervical spine; hypomobile and lordotic c/sp  CERVICAL ROM:   Active ROM A/PROM (deg) eval  Flexion 27  Extension 40  Right lateral flexion 30 *  Left lateral flexion 23 *  Right rotation 58  Left rotation 59 *   (Blank rows = not tested) *=pain/symptoms  UPPER EXTREMITY ROM: WFL for tasks assessed  Active ROM Right eval Left eval  Shoulder flexion    Shoulder extension    Shoulder abduction    Shoulder adduction    Shoulder extension    Shoulder internal rotation    Shoulder external rotation    Elbow flexion    Elbow extension    Wrist flexion    Wrist extension    Wrist ulnar deviation    Wrist radial deviation    Wrist pronation    Wrist supination     (Blank rows = not tested) *=pain/symptoms  UPPER EXTREMITY MMT:  MMT Right eval Left eval  Shoulder flexion 4+ 4+  Shoulder extension    Shoulder abduction 4+ 4+  Shoulder adduction    Shoulder extension    Shoulder internal rotation    Shoulder external rotation    Middle trapezius    Lower trapezius    Elbow flexion 5 5   Elbow extension 5 5  Wrist flexion    Wrist extension    Wrist ulnar deviation    Wrist radial deviation    Wrist pronation    Wrist supination    Grip strength     (Blank rows = not tested) *=pain/symptoms  CERVICAL SPECIAL TESTS:  Distraction test: Negative    TODAY'S TREATMENT:                                                                                                                              10/24 Trigger Point Dry-Needling  Treatment instructions: Expect mild to moderate muscle soreness. S/S of pneumothorax if dry needled over a lung field, and to seek immediate medical attention should they occur. Patient verbalized understanding of these instructions and education.  Patient Consent Given: Yes Education handout provided: Yes Muscles treated: upper trap 3x using .30x50 needle cervical spine left .30x50 neede 2s pots right  upper trap 2 spots  Electrical stimulation performed: No Parameters: N/A Treatment response/outcome: great twitch   Manual: reviewed use of thera-cane for self trigger point release. Skilled palpation of trigger points. Sub-occipital release. Gentle manual traction. Thoracic PA gross mobilization    Bilateral er 2x15 red Bilateral horizontal abduction 2x15 red  Bilateral flexion  2x15 red band    Reviewed HEP and stretching     Last visit:  Trigger Point Dry-Needling  Treatment instructions: Expect mild to moderate muscle soreness. S/S of pneumothorax if dry needled over a lung field, and to seek immediate medical attention should they occur. Patient verbalized understanding of these instructions and education.  Patient Consent Given: Yes Education handout provided: Yes Muscles treated: upper trap 3x using .30x50 needle  Electrical stimulation performed: No Parameters: N/A Treatment response/outcome: great twitch   Manual: reviewed use of thera-cane for self trigger point release. Skilled palpation of trigger points. Sub-occipital  release. Gentle manual traction. Thoracici PA gross mobilization   Reviewed upper trap stretch 3x20 sec hold  Cervical rotation 3x to each side pain free  Row red 2x15   Therapy reviewed books stands he can use in his chair. Flexion causes exacerbated pain.    Treatment                            10/30/22:  Manual: STM bil upper traps, lateral glides C3 through T2, cervical distraction, distraction+suboccipital release, distraction+cervical rotation, bil first rib depression paired with cervical sidebend.  Supine retraction; tuck+lift <5s hold available today Supine chin tuck+horiz abd stretch UEs- 3 breath holds Supine pec stretch, horiz abd red tband- all with chin tuck Seated ER green tband Rotation SNAG Added sheet to upper trap stretch for first rib mobilization/depression   DATE:  10/25/22  Seated scap retraction 2 x 10 Seated UT stretch 3 x 30 second holds L  PATIENT EDUCATION:  Education details: Patient educated on exam findings, POC, scope of PT, HEP, and dry needling. Person educated: Patient Education method: Explanation, Demonstration, and Handouts Education comprehension: verbalized understanding, returned demonstration, verbal cues required, and tactile cues required  HOME EXERCISE PROGRAM: Access Code: [email or web address] URL: [email or web address]    ASSESSMENT:  CLINICAL IMPRESSION: Patient tolerated treatment well.  He had a great twitch response on the left side.  He had less of a twitch response on the right side. We reviewed HEP and patient was given a seated series of exercises. We continue to work on thoracic mobility as well. We will continue to progress as tolerated.   OBJECTIVE IMPAIRMENTS: decreased activity tolerance, decreased endurance, decreased mobility, decreased ROM, decreased strength, hypomobility, increased muscle spasms, impaired flexibility, impaired UE functional use, improper body mechanics, postural dysfunction, and pain.    ACTIVITY LIMITATIONS: carrying, lifting, bending, reach over head, and caring for others  PARTICIPATION LIMITATIONS: meal prep, cleaning, laundry, driving, shopping, community activity, and yard work  PERSONAL FACTORS: 3+ comorbidities: Afib, hx NSTEMI, hx transmetatarsal, amputation L, CHF, PVD, HLD, Ascending aortic aneurysm, DM  are also affecting patient's functional outcome.   REHAB POTENTIAL: Good  CLINICAL DECISION MAKING: Stable/uncomplicated  EVALUATION COMPLEXITY: Low   GOALS: Goals reviewed with patient? Yes  SHORT TERM GOALS: Target date: 11/15/2022    Patient will be independent with HEP in order to improve functional outcomes. Baseline: Goal status: INITIAL  2.  Patient will report at least 25% improvement in symptoms for improved quality of life. Baseline:  Goal status: INITIAL  LONG TERM GOALS: Target date: 12/06/2022    Patient will report at least 75% improvement in symptoms for improved quality of life. Baseline:  Goal status: INITIAL  2.  Patient will improve FOTO score by at least 11 points in order to indicate improved tolerance to activity. Baseline: 56% function Goal status: INITIAL  3.  Patient will demonstrate at least 25% improvement in cervical ROM in all restricted planes for improved ability to move head while driving. Baseline: see above Goal status: INITIAL  4.  Patient will be able to return to all activities unrestricted for improved ability to perform work functions and participate with family.  Baseline:  Goal status: INITIAL  5.  Patient will demonstrate grade of 5/5 MMT grade in all tested musculature as evidence of improved strength to assist with lifting at home. Baseline: see above Goal status: INITIAL      PLAN:  PT FREQUENCY: 2x/week  PT DURATION: 6 weeks  PLANNED INTERVENTIONS: Therapeutic exercises, Therapeutic activity, Neuromuscular re-education, Balance training, Gait training, Patient/Family education,  Joint manipulation, Joint mobilization, Stair training, Orthotic/Fit training, DME instructions, Aquatic Therapy, Dry Needling, Electrical stimulation, Spinal manipulation, Spinal mobilization, Cryotherapy, Moist heat, Compression bandaging, scar mobilization, Splintting, Taping, Traction, Ultrasound, Ionotophoresis 4mg /ml Dexamethasone, and Manual therapy  PLAN FOR NEXT SESSION: f/u with HEP; possibly DN, postural strength, cervical mobility   Jessica C. Hightower PT, DPT 11/16/22 11:06 AM

## 2022-11-16 ENCOUNTER — Ambulatory Visit: Payer: Medicare Other | Admitting: Orthopedic Surgery

## 2022-11-16 ENCOUNTER — Encounter: Payer: Self-pay | Admitting: Orthopedic Surgery

## 2022-11-16 ENCOUNTER — Encounter (HOSPITAL_BASED_OUTPATIENT_CLINIC_OR_DEPARTMENT_OTHER): Payer: Self-pay | Admitting: Physical Therapy

## 2022-11-16 DIAGNOSIS — Z89432 Acquired absence of left foot: Secondary | ICD-10-CM

## 2022-11-16 DIAGNOSIS — L97521 Non-pressure chronic ulcer of other part of left foot limited to breakdown of skin: Secondary | ICD-10-CM | POA: Diagnosis not present

## 2022-11-16 NOTE — Progress Notes (Signed)
Office Visit Note   Patient: [name]           Date of Birth: 1950/08/18           MRN: [id number] Visit Date: 11/16/2022              Requested by: Thana Ates, MD [address] [place],  [place] [address] PCP: Thana Ates, MD  Chief Complaint  Patient presents with   Left Foot - Follow-up      HPI: Patient is a 72 year old gentleman who is seen in follow-up for [facility] grade 1 ulcer left foot transmetatarsal amputation.  Patient's most recent orthotics had a hard composition that was putting increased pressure on the forefoot.  Placement of a felt relieving donut and modifying the orthotic significantly helped the wound.  Assessment & Plan: Visit Diagnoses:  1. History of transmetatarsal amputation of left foot (HCC)   2. Non-pressure chronic ulcer of other part of left foot limited to breakdown of skin (HCC)     Plan: Patient's older orthotic was also modified with a cut out for the pressure-point and a felt relieving donut applied.  Patient will continue with his current wound care.  Follow-Up Instructions: Return in about 4 weeks (around 12/14/2022).   Ortho Exam  Patient is alert, oriented, no adenopathy, well-dressed, normal affect, normal respiratory effort. Examination patient has no swelling or cellulitis in the foot.  There are no ischemic changes.  Patient has a Wagner grade 1 ulcer on the plantar aspect of the foot from an area of increased pressure.  After informed consent a 10 blade knife was used to debride the skin and soft tissue back to healthy viable granulation tissue.  This did not extend to bone or tendon.  After debridement the ulcer is 2 cm in diameter and 2 mm deep.  Imaging: No results found. No images are attached to the encounter.  Labs: Lab Results  Component Value Date   HGBA1C 6.7 (H) 08/24/2022   HGBA1C 6.8 (H) 03/28/2020   HGBA1C 6.5 (H) 09/26/2015   REPTSTATUS 08/01/2016 FINAL 07/30/2016   GRAMSTAIN  06/04/2014     MODERATE WBC PRESENT, PREDOMINANTLY PMN NO SQUAMOUS EPITHELIAL CELLS SEEN MODERATE GRAM POSITIVE COCCI IN CLUSTERS Performed at [facility]    GRAMSTAIN  06/04/2014    MODERATE WBC PRESENT, PREDOMINANTLY PMN NO SQUAMOUS EPITHELIAL CELLS SEEN MODERATE GRAM POSITIVE COCCI IN PAIRS Performed at [facility]    CULT  07/30/2016    NO GROWTH Performed at [facility], [address], [place], [place] [address]      Lab Results  Component Value Date   ALBUMIN 3.2 (L) 08/24/2022   ALBUMIN 3.4 (L) 08/23/2022   ALBUMIN 4.1 08/22/2022    Lab Results  Component Value Date   MG 1.8 08/22/2022   MG 1.8 08/08/2021   MG 2.0 04/02/2020   Lab Results  Component Value Date   VD25OH 53.03 03/28/2020    No results found for: "PREALBUMIN"    Latest Ref Rng & Units 08/24/2022   12:58 AM 08/23/2022    3:49 AM 08/22/2022    3:57 PM  CBC EXTENDED  WBC 4.0 - 10.5 K/uL 6.4  5.3  9.5   RBC 4.22 - 5.81 MIL/uL 4.15  4.05  4.85   Hemoglobin 13.0 - 17.0 g/dL 44.0  34.7  42.5   HCT 39.0 - 52.0 % 37.5  38.0  43.8   Platelets 150 -  400 K/uL 187  163  236      There is no height or weight on file to calculate BMI.  Orders:  No orders of the defined types were placed in this encounter.  No orders of the defined types were placed in this encounter.    Procedures: No procedures performed  Clinical Data: No additional findings.  ROS:  All other systems negative, except as noted in the HPI. Review of Systems  Objective: Vital Signs: There were no vitals taken for this visit.  Specialty Comments:  No specialty comments available.  PMFS History: Patient Active Problem List   Diagnosis Date Noted   Paroxysmal atrial fibrillation with RVR (HCC) 08/23/2022   Ascending aortic aneurysm (HCC) 08/23/2022   Bradycardia 08/22/2022   NSTEMI (non-ST elevated myocardial infarction) (HCC) 08/22/2022   Atrial fibrillation with RVR (HCC) 08/22/2022   Hypotension  01/07/2021   S/P arthroscopy of right shoulder 06/02/2020   Closed fracture of neck of right femur (HCC)    Hip fracture (HCC) 03/27/2020   Nontraumatic complete tear of right rotator cuff 02/26/2020   S/P transmetatarsal amputation of foot, left (HCC) 04/25/2016   Chronic osteomyelitis of toe, left (HCC)    Chronic diastolic CHF (congestive heart failure) (HCC) 09/25/2015   Aortic stenosis 08/04/2014   Diabetic foot infection (HCC) 06/03/2014   Peripheral vascular disease (HCC) 06/03/2014   Coronary artery disease involving native coronary artery of native heart without angina pectoris 07/29/2013   Trifascicular block 07/29/2013   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 remote stenting to ramus, prox LAD x2.   Dental crowns present    Diabetes mellitus    First degree AV block    GERD (gastroesophageal reflux disease)    Heart attack (HCC) 06/2004   Heart murmur    aortic   Hx MRSA infection 02/2006   Hyperlipidemia    Hypertension    hx. of - has not been on med. since losing wt. after gastric bypass   Hypothyroidism    Morbid obesity (HCC)    Mucoid cyst of joint 09/2011   left thumb   Sleep apnea    sleep study 03/29/2011; no CPAP use, lost >130lbs   Trifascicular block     Family History  Problem Relation Age of Onset   Heart disease Mother     Past Surgical History:  Procedure Laterality Date   AMPUTATION Left 04/19/2016   Procedure: Left Foot 4th Toe Amputation vs. Transmetatarsal;  Surgeon: Nadara Mustard, MD;  [facility];  Service: Orthopedics;  Laterality: Left;   BIOPSY  04/07/2019   Procedure: BIOPSY;  Surgeon: Kathi Der, MD;  [facility];  Service: Gastroenterology;;   CATARACT EXTRACTION  02/2010; 03/2010   COLONOSCOPY WITH PROPOFOL N/A 09/14/2014   Procedure: COLONOSCOPY WITH PROPOFOL;  Surgeon: Charolett Bumpers, MD;  [facility];  Service: Endoscopy;  Laterality: N/A;   COLONOSCOPY WITH PROPOFOL N/A 04/07/2019   Procedure: COLONOSCOPY WITH PROPOFOL;  Surgeon: Kathi Der, MD;  [facility];  Service: Gastroenterology;  Laterality: N/A;   CORONARY ANGIOPLASTY WITH STENT PLACEMENT  07/12/2004; 08/03/2004   total of 3 stents   CORONARY STENT INTERVENTION N/A 08/23/2022   Procedure: CORONARY STENT INTERVENTION;  Surgeon: Swaziland, Peter M, MD;  Location: [facility] INVASIVE CV LAB;  Service: Cardiovascular;  Laterality: N/A;   I & D EXTREMITY Right 06/04/2014   Procedure: IRRIGATION AND DEBRIDEMENT EXTREMITY;  Surgeon: Kathryne Hitch, MD;  Location: [facility] OR;  Service: Orthopedics;  Laterality: Right;   LEFT HEART CATH AND CORONARY ANGIOGRAPHY N/A 08/23/2022   Procedure: LEFT HEART CATH AND CORONARY ANGIOGRAPHY;  Surgeon: Swaziland, Peter M, MD;  Location: [facility] INVASIVE CV LAB;  Service: Cardiovascular;  Laterality: N/A;   MASS EXCISION  10/04/2011   Procedure: EXCISION MASS;  Surgeon: Nicki Reaper, MD;  Location: [facility];  Service: Orthopedics;  Laterality: Left;  excision cyst debridment ip joint of left thumb   PROSTATECTOMY     right below knee amputation  2018   ROUX-EN-Y GASTRIC BYPASS  10/10/2010   laparoscopic   SHOULDER ARTHROSCOPY WITH ROTATOR CUFF REPAIR AND SUBACROMIAL DECOMPRESSION Right 03/23/2020   Procedure: RIGHT SHOULDER ARTHROSCOPY WITH DEBRIDEMENT AND  ROTATOR CUFF REPAIR;  Surgeon: Kathryne Hitch, MD;  [facility];  Service: Orthopedics;  Laterality: Right;   TOE AMPUTATION  02/23/2006   left foot second ray amputation   TOTAL HIP ARTHROPLASTY Right 03/29/2020   Procedure: TOTAL HIP ARTHROPLASTY POSTERIOR APPROACH;  Surgeon: Tarry Kos,  MD;  Location: [facility];  Service: Orthopedics;  Laterality: Right;   TRANSMETATARSAL AMPUTATION Left    TRIGGER FINGER RELEASE Right 09/16/2019   Procedure: RELEASE TRIGGER FINGER/A-1 PULLEY;  Surgeon: Cindee Salt, MD;  Location: [facility];  Service: Orthopedics;  Laterality: Right;  IV REGIONAL FOREARM BLOCK   Social History   Occupational History   Occupation: IT trainer taxes  Tobacco Use   Smoking status: Never   Smokeless tobacco: Never  Substance and Sexual Activity   Alcohol use: No   Drug use: No   Sexual activity: Yes

## 2022-11-18 ENCOUNTER — Ambulatory Visit (HOSPITAL_BASED_OUTPATIENT_CLINIC_OR_DEPARTMENT_OTHER): Payer: Medicare Other | Admitting: Physical Therapy

## 2022-11-18 ENCOUNTER — Encounter (HOSPITAL_BASED_OUTPATIENT_CLINIC_OR_DEPARTMENT_OTHER): Payer: Self-pay

## 2022-11-20 DIAGNOSIS — D2372 Other benign neoplasm of skin of left lower limb, including hip: Secondary | ICD-10-CM | POA: Diagnosis not present

## 2022-11-20 DIAGNOSIS — D1801 Hemangioma of skin and subcutaneous tissue: Secondary | ICD-10-CM | POA: Diagnosis not present

## 2022-11-20 DIAGNOSIS — L814 Other melanin hyperpigmentation: Secondary | ICD-10-CM | POA: Diagnosis not present

## 2022-11-20 DIAGNOSIS — L821 Other seborrheic keratosis: Secondary | ICD-10-CM | POA: Diagnosis not present

## 2022-11-20 DIAGNOSIS — D225 Melanocytic nevi of trunk: Secondary | ICD-10-CM | POA: Diagnosis not present

## 2022-11-20 DIAGNOSIS — D1721 Benign lipomatous neoplasm of skin and subcutaneous tissue of right arm: Secondary | ICD-10-CM | POA: Diagnosis not present

## 2022-11-20 DIAGNOSIS — D692 Other nonthrombocytopenic purpura: Secondary | ICD-10-CM | POA: Diagnosis not present

## 2022-11-20 DIAGNOSIS — D224 Melanocytic nevi of scalp and neck: Secondary | ICD-10-CM | POA: Diagnosis not present

## 2022-11-22 ENCOUNTER — Encounter (HOSPITAL_BASED_OUTPATIENT_CLINIC_OR_DEPARTMENT_OTHER): Payer: Self-pay | Admitting: Physical Therapy

## 2022-11-22 ENCOUNTER — Ambulatory Visit (HOSPITAL_BASED_OUTPATIENT_CLINIC_OR_DEPARTMENT_OTHER): Payer: Medicare Other | Admitting: Physical Therapy

## 2022-11-22 DIAGNOSIS — R29898 Other symptoms and signs involving the musculoskeletal system: Secondary | ICD-10-CM | POA: Diagnosis not present

## 2022-11-22 DIAGNOSIS — M542 Cervicalgia: Secondary | ICD-10-CM

## 2022-11-22 NOTE — Therapy (Signed)
OUTPATIENT PHYSICAL THERAPY CERVICAL Treatment   Patient Name: [name] MRN: [id number] DOB:07-05-1950, 72 y.o., male Today's Date: 11/22/2022  END OF SESSION:  PT End of Session - 11/22/22 0931     Visit Number 5    Number of Visits 12    Date for PT Re-Evaluation 12/06/22    Authorization Type Medicare    PT Start Time 0931    PT Stop Time 1012    PT Time Calculation (min) 41 min    Activity Tolerance Patient tolerated treatment well    Behavior During Therapy [facility] for tasks assessed/performed               Past Medical History:  Diagnosis Date   Anemia    low iron   Arthritis    Coronary artery disease 2006   a. remote stenting to ramus, prox LAD x2.   Dental crowns present    Diabetes mellitus    First degree AV block    GERD (gastroesophageal reflux disease)    Heart attack (HCC) 06/2004   Heart murmur    aortic   Hx MRSA infection 02/2006   Hyperlipidemia    Hypertension    hx. of - has not been on med. since losing wt. after gastric bypass   Hypothyroidism    Morbid obesity (HCC)    Mucoid cyst of joint 09/2011   left thumb   Sleep apnea    sleep study 03/29/2011; no CPAP use, lost >130lbs   Trifascicular block    Past Surgical History:  Procedure Laterality Date   AMPUTATION Left 04/19/2016   Procedure: Left Foot 4th Toe Amputation vs. Transmetatarsal;  Surgeon: Nadara Mustard, MD;  Location: [facility] OR;  Service: Orthopedics;  Laterality: Left;   BIOPSY  04/07/2019   Procedure: BIOPSY;  Surgeon: Kathi Der, MD;  Location: [facility];  Service: Gastroenterology;;   CATARACT EXTRACTION  02/2010; 03/2010   COLONOSCOPY WITH PROPOFOL N/A 09/14/2014   Procedure: COLONOSCOPY WITH PROPOFOL;  Surgeon: Charolett Bumpers, MD;  Location: [facility];  Service: Endoscopy;  Laterality: N/A;   COLONOSCOPY WITH PROPOFOL N/A 04/07/2019   Procedure: COLONOSCOPY WITH PROPOFOL;  Surgeon: Kathi Der, MD;  Location: [facility];  Service: Gastroenterology;   Laterality: N/A;   CORONARY ANGIOPLASTY WITH STENT PLACEMENT  07/12/2004; 08/03/2004   total of 3 stents   CORONARY STENT INTERVENTION N/A 08/23/2022   Procedure: CORONARY STENT INTERVENTION;  Surgeon: Swaziland, Peter M, MD;  Location: [facility] INVASIVE CV LAB;  Service: Cardiovascular;  Laterality: N/A;   I & D EXTREMITY Right 06/04/2014   Procedure: IRRIGATION AND DEBRIDEMENT EXTREMITY;  Surgeon: Kathryne Hitch, MD;  Location: [facility] OR;  Service: Orthopedics;  Laterality: Right;   LEFT HEART CATH AND CORONARY ANGIOGRAPHY N/A 08/23/2022   Procedure: LEFT HEART CATH AND CORONARY ANGIOGRAPHY;  Surgeon: Swaziland, Peter M, MD;  Location: [facility] INVASIVE CV LAB;  Service: Cardiovascular;  Laterality: N/A;   MASS EXCISION  10/04/2011   Procedure: EXCISION MASS;  Surgeon: Nicki Reaper, MD;  Location: [facility];  Service: Orthopedics;  Laterality: Left;  excision cyst debridment ip joint of left thumb   PROSTATECTOMY     right below knee amputation  2018   ROUX-EN-Y GASTRIC BYPASS  10/10/2010   laparoscopic   SHOULDER ARTHROSCOPY WITH ROTATOR CUFF REPAIR AND SUBACROMIAL DECOMPRESSION Right 03/23/2020   Procedure: RIGHT SHOULDER ARTHROSCOPY WITH DEBRIDEMENT AND  ROTATOR CUFF REPAIR;  Surgeon: Kathryne Hitch, MD;  Location: [facility] OR;  Service: Orthopedics;  Laterality: Right;   TOE AMPUTATION  02/23/2006   left foot second ray amputation   TOTAL HIP ARTHROPLASTY Right 03/29/2020   Procedure: TOTAL HIP ARTHROPLASTY POSTERIOR APPROACH;  Surgeon: Tarry Kos, MD;  Location: [facility] OR;  Service: Orthopedics;  Laterality: Right;   TRANSMETATARSAL AMPUTATION Left    TRIGGER FINGER RELEASE Right 09/16/2019   Procedure: RELEASE TRIGGER FINGER/A-1 PULLEY;  Surgeon: Cindee Salt, MD;  Location: [facility];  Service: Orthopedics;  Laterality: Right;  IV REGIONAL FOREARM BLOCK   Patient Active Problem List   Diagnosis Date Noted   Paroxysmal atrial fibrillation with RVR (HCC) 08/23/2022   Ascending  aortic aneurysm (HCC) 08/23/2022   Bradycardia 08/22/2022   NSTEMI (non-ST elevated myocardial infarction) (HCC) 08/22/2022   Atrial fibrillation with RVR (HCC) 08/22/2022   Hypotension 01/07/2021   S/P arthroscopy of right shoulder 06/02/2020   Closed fracture of neck of right femur (HCC)    Hip fracture (HCC) 03/27/2020   Nontraumatic complete tear of right rotator cuff 02/26/2020   S/P transmetatarsal amputation of foot, left (HCC) 04/25/2016   Chronic osteomyelitis of toe, left (HCC)    Chronic diastolic CHF (congestive heart failure) (HCC) 09/25/2015   Aortic stenosis 08/04/2014   Diabetic foot infection (HCC) 06/03/2014   Peripheral vascular disease (HCC) 06/03/2014   Coronary artery disease involving native coronary artery of native heart without angina pectoris 07/29/2013   Trifascicular block 07/29/2013   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 07/26/2011   Hypothyroidism 01/22/2007   Type 2 diabetes mellitus with vascular disease (HCC) 01/22/2007   HYPERLIPIDEMIA, MIXED 01/22/2007   MYOCARDIAL INFARCTION, HX OF 01/22/2007   ANEMIA, IRON DEFICIENCY, HX OF 01/22/2007    PCP: Hillard Danker MD  REFERRING PROVIDER: Kirtland Bouchard, PA-C  REFERRING DIAG: M54.2 (ICD-10-CM) - Cervicalgia  THERAPY DIAG:  Cervicalgia  Other symptoms and signs involving the musculoskeletal system  Rationale for Evaluation and Treatment: Rehabilitation  ONSET DATE: 2.5-3 months  SUBJECTIVE:                                                                                                                                                                                                         SUBJECTIVE STATEMENT: Patient states low back is sore today from sleeping in the bed. Patient states neck symptoms come and go. DN helped a lot first time and not as much the 2nd time. Neck symptoms with turning to the left. Neck not as painful with lifting head back up after looking down. Thinks reading stand  has helped some and he is not looking down as much with reading.     PERTINENT HISTORY:  Afib, hx NSTEMI, hx transmetatarsal amputation L, R BKA, CHF, PVD, HLD, Ascending aortic aneurysm, DM  PAIN:  Are you having pain? Yes: NPRS scale: 4-5 intermittent/10 Pain location: L cervical spine Pain description: sharp Aggravating factors: L rotation Relieving factors: rest  PRECAUTIONS: None  WEIGHT BEARING RESTRICTIONS: No  FALLS:  Has patient fallen in last 6 months? No  PLOF: Independent  PATIENT GOALS: turn to left and not have pain   OBJECTIVE: (objective measures from initial evaluation unless otherwise dated)  DIAGNOSTIC FINDINGS:  XR 09/14/22: Cervical spine 3 views: Shows normal lordotic curvature.  Disc base overall well-maintained.  No acute fractures no spondylolisthesis.  Minimal arthritic changes.   PATIENT SURVEYS:  FOTO 56% function  COGNITION: Overall cognitive status: Within functional limits for tasks assessed  SENSATION: WFL  POSTURE: rounded shoulders, forward head, and increased thoracic kyphosis  PALPATION: TTP bilateral UT, concordant on L, minimal to no tenderness throughout rest of cervical spine; hypomobile and lordotic c/sp  CERVICAL ROM:   Active ROM A/PROM (deg) eval  Flexion 27  Extension 40  Right lateral flexion 30 *  Left lateral flexion 23 *  Right rotation 58  Left rotation 59 *   (Blank rows = not tested) *=pain/symptoms  UPPER EXTREMITY ROM: WFL for tasks assessed  Active ROM Right eval Left eval  Shoulder flexion    Shoulder extension    Shoulder abduction    Shoulder adduction    Shoulder extension    Shoulder internal rotation    Shoulder external rotation    Elbow flexion    Elbow extension    Wrist flexion    Wrist extension    Wrist ulnar deviation    Wrist radial deviation    Wrist pronation    Wrist supination     (Blank rows = not tested) *=pain/symptoms  UPPER EXTREMITY MMT:  MMT Right eval  Left eval  Shoulder flexion 4+ 4+  Shoulder extension    Shoulder abduction 4+ 4+  Shoulder adduction    Shoulder extension    Shoulder internal rotation    Shoulder external rotation    Middle trapezius    Lower trapezius    Elbow flexion 5 5  Elbow extension 5 5  Wrist flexion    Wrist extension    Wrist ulnar deviation    Wrist radial deviation    Wrist pronation    Wrist supination    Grip strength     (Blank rows = not tested) *=pain/symptoms  CERVICAL SPECIAL TESTS:  Distraction test: Negative    TODAY'S TREATMENT:                                                                                                                              11/22/22 TTP L UT   Manual: STM to L UT pre and post  dry needling for trigger point identification and muscular relaxation.  Trigger Point Dry-Needling  Treatment instructions: Expect mild to moderate muscle soreness. S/S of pneumothorax if dry needled over a lung field, and to seek immediate medical attention should they occur. Patient verbalized understanding of these instructions and education.  Patient Consent Given: Yes Education handout provided: Previously provided Muscles treated: L UT Electrical stimulation performed: No Parameters: N/A Treatment response/outcome: twitch response, decrease in tissue tension, decrease in symptoms.  Seated UT stretch 3 x 30 second holds L Supine retraction; tuck+lift 10 x 5 second holds Seated thoracic extension over chair 1 x 10 Seated cervical retractions 1 x 10    10/24 Trigger Point Dry-Needling  Treatment instructions: Expect mild to moderate muscle soreness. S/S of pneumothorax if dry needled over a lung field, and to seek immediate medical attention should they occur. Patient verbalized understanding of these instructions and education.  Patient Consent Given: Yes Education handout provided: Yes Muscles treated: upper trap 3x using .30x50 needle cervical spine left .30x50  neede 2s pots right upper trap 2 spots  Electrical stimulation performed: No Parameters: N/A Treatment response/outcome: great twitch   Manual: reviewed use of thera-cane for self trigger point release. Skilled palpation of trigger points. Sub-occipital release. Gentle manual traction. Thoracic PA gross mobilization    Bilateral er 2x15 red Bilateral horizontal abduction 2x15 red  Bilateral flexion  2x15 red band    Reviewed HEP and stretching     Last visit:  Trigger Point Dry-Needling  Treatment instructions: Expect mild to moderate muscle soreness. S/S of pneumothorax if dry needled over a lung field, and to seek immediate medical attention should they occur. Patient verbalized understanding of these instructions and education.  Patient Consent Given: Yes Education handout provided: Yes Muscles treated: upper trap 3x using .30x50 needle  Electrical stimulation performed: No Parameters: N/A Treatment response/outcome: great twitch   Manual: reviewed use of thera-cane for self trigger point release. Skilled palpation of trigger points. Sub-occipital release. Gentle manual traction. Thoracici PA gross mobilization   Reviewed upper trap stretch 3x20 sec hold  Cervical rotation 3x to each side pain free  Row red 2x15   Therapy reviewed books stands he can use in his chair. Flexion causes exacerbated pain.    Treatment                            10/30/22:  Manual: STM bil upper traps, lateral glides C3 through T2, cervical distraction, distraction+suboccipital release, distraction+cervical rotation, bil first rib depression paired with cervical sidebend.  Supine retraction; tuck+lift <5s hold available today Supine chin tuck+horiz abd stretch UEs- 3 breath holds Supine pec stretch, horiz abd red tband- all with chin tuck Seated ER green tband Rotation SNAG Added sheet to upper trap stretch for first rib mobilization/depression   DATE:  10/25/22  Seated scap retraction 2 x  10 Seated UT stretch 3 x 30 second holds L  PATIENT EDUCATION:  Education details: Patient educated on exam findings, POC, scope of PT, HEP, and dry needling. Person educated: Patient Education method: Explanation, Demonstration, and Handouts Education comprehension: verbalized understanding, returned demonstration, verbal cues required, and tactile cues required  HOME EXERCISE PROGRAM: Access Code: [email or web address] URL: [email or web address]    ASSESSMENT:  CLINICAL IMPRESSION: Patient with continued UT trigger points. Continued with DN with decrease in tissue tension and twitch response with decrease in symptoms to 3/10 following.  Continued with cervical mobility and postural strengthening. Large improvement  in posture with cueing for cervical retraction. Patient will continue to benefit from physical therapy in order to improve function and reduce impairment.   OBJECTIVE IMPAIRMENTS: decreased activity tolerance, decreased endurance, decreased mobility, decreased ROM, decreased strength, hypomobility, increased muscle spasms, impaired flexibility, impaired UE functional use, improper body mechanics, postural dysfunction, and pain.   ACTIVITY LIMITATIONS: carrying, lifting, bending, reach over head, and caring for others  PARTICIPATION LIMITATIONS: meal prep, cleaning, laundry, driving, shopping, community activity, and yard work  PERSONAL FACTORS: 3+ comorbidities: Afib, hx NSTEMI, hx transmetatarsal, amputation L, CHF, PVD, HLD, Ascending aortic aneurysm, DM  are also affecting patient's functional outcome.   REHAB POTENTIAL: Good  CLINICAL DECISION MAKING: Stable/uncomplicated  EVALUATION COMPLEXITY: Low   GOALS: Goals reviewed with patient? Yes  SHORT TERM GOALS: Target date: 11/15/2022    Patient will be independent with HEP in order to improve functional outcomes. Baseline: Goal status: INITIAL  2.  Patient will report at least 25% improvement in symptoms  for improved quality of life. Baseline:  Goal status: INITIAL    LONG TERM GOALS: Target date: 12/06/2022    Patient will report at least 75% improvement in symptoms for improved quality of life. Baseline:  Goal status: INITIAL  2.  Patient will improve FOTO score by at least 11 points in order to indicate improved tolerance to activity. Baseline: 56% function Goal status: INITIAL  3.  Patient will demonstrate at least 25% improvement in cervical ROM in all restricted planes for improved ability to move head while driving. Baseline: see above Goal status: INITIAL  4.  Patient will be able to return to all activities unrestricted for improved ability to perform work functions and participate with family.  Baseline:  Goal status: INITIAL  5.  Patient will demonstrate grade of 5/5 MMT grade in all tested musculature as evidence of improved strength to assist with lifting at home. Baseline: see above Goal status: INITIAL      PLAN:  PT FREQUENCY: 2x/week  PT DURATION: 6 weeks  PLANNED INTERVENTIONS: Therapeutic exercises, Therapeutic activity, Neuromuscular re-education, Balance training, Gait training, Patient/Family education, Joint manipulation, Joint mobilization, Stair training, Orthotic/Fit training, DME instructions, Aquatic Therapy, Dry Needling, Electrical stimulation, Spinal manipulation, Spinal mobilization, Cryotherapy, Moist heat, Compression bandaging, scar mobilization, Splintting, Taping, Traction, Ultrasound, Ionotophoresis 4mg /ml Dexamethasone, and Manual therapy  PLAN FOR NEXT SESSION: f/u with HEP; possibly DN, postural strength, cervical mobility    Reola Mosher Calia Napp, PT 11/22/2022, 10:14 AM

## 2022-11-25 ENCOUNTER — Ambulatory Visit (HOSPITAL_BASED_OUTPATIENT_CLINIC_OR_DEPARTMENT_OTHER): Payer: Medicare Other | Attending: Physician Assistant | Admitting: Physical Therapy

## 2022-11-25 ENCOUNTER — Encounter (HOSPITAL_BASED_OUTPATIENT_CLINIC_OR_DEPARTMENT_OTHER): Payer: Self-pay | Admitting: Physical Therapy

## 2022-11-25 DIAGNOSIS — M542 Cervicalgia: Secondary | ICD-10-CM | POA: Insufficient documentation

## 2022-11-25 DIAGNOSIS — R29898 Other symptoms and signs involving the musculoskeletal system: Secondary | ICD-10-CM | POA: Insufficient documentation

## 2022-11-25 NOTE — Therapy (Signed)
OUTPATIENT PHYSICAL THERAPY CERVICAL Treatment   Patient Name: [name] MRN: [id number] DOB:07-Aug-1950, 72 y.o., male Today's Date: 11/25/2022  END OF SESSION:  PT End of Session - 11/25/22 1132     Visit Number 6    Number of Visits 12    Date for PT Re-Evaluation 12/06/22    Authorization Type Medicare    PT Start Time 1131    PT Stop Time 1209    PT Time Calculation (min) 38 min    Activity Tolerance Patient tolerated treatment well    Behavior During Therapy [facility] for tasks assessed/performed                Past Medical History:  Diagnosis Date   Anemia    low iron   Arthritis    Coronary artery disease 2006   a. remote stenting to ramus, prox LAD x2.   Dental crowns present    Diabetes mellitus    First degree AV block    GERD (gastroesophageal reflux disease)    Heart attack (HCC) 06/2004   Heart murmur    aortic   Hx MRSA infection 02/2006   Hyperlipidemia    Hypertension    hx. of - has not been on med. since losing wt. after gastric bypass   Hypothyroidism    Morbid obesity (HCC)    Mucoid cyst of joint 09/2011   left thumb   Sleep apnea    sleep study 03/29/2011; no CPAP use, lost >130lbs   Trifascicular block    Past Surgical History:  Procedure Laterality Date   AMPUTATION Left 04/19/2016   Procedure: Left Foot 4th Toe Amputation vs. Transmetatarsal;  Surgeon: Nadara Mustard, MD;  Location: [facility];  Service: Orthopedics;  Laterality: Left;   BIOPSY  04/07/2019   Procedure: BIOPSY;  Surgeon: Kathi Der, MD;  Location: [facility];  Service: Gastroenterology;;   CATARACT EXTRACTION  02/2010; 03/2010   COLONOSCOPY WITH PROPOFOL N/A 09/14/2014   Procedure: COLONOSCOPY WITH PROPOFOL;  Surgeon: Charolett Bumpers, MD;  Location: [facility];  Service: Endoscopy;  Laterality: N/A;   COLONOSCOPY WITH PROPOFOL N/A 04/07/2019   Procedure: COLONOSCOPY WITH PROPOFOL;  Surgeon: Kathi Der, MD;  Location: [facility];  Service: Gastroenterology;   Laterality: N/A;   CORONARY ANGIOPLASTY WITH STENT PLACEMENT  07/12/2004; 08/03/2004   total of 3 stents   CORONARY STENT INTERVENTION N/A 08/23/2022   Procedure: CORONARY STENT INTERVENTION;  Surgeon: Swaziland, Peter M, MD;  Location: [facility] INVASIVE CV LAB;  Service: Cardiovascular;  Laterality: N/A;   I & D EXTREMITY Right 06/04/2014   Procedure: IRRIGATION AND DEBRIDEMENT EXTREMITY;  Surgeon: Kathryne Hitch, MD;  Location: [facility] OR;  Service: Orthopedics;  Laterality: Right;   LEFT HEART CATH AND CORONARY ANGIOGRAPHY N/A 08/23/2022   Procedure: LEFT HEART CATH AND CORONARY ANGIOGRAPHY;  Surgeon: Swaziland, Peter M, MD;  Location: [facility] INVASIVE CV LAB;  Service: Cardiovascular;  Laterality: N/A;   MASS EXCISION  10/04/2011   Procedure: EXCISION MASS;  Surgeon: Nicki Reaper, MD;  Location: [facility];  Service: Orthopedics;  Laterality: Left;  excision cyst debridment ip joint of left thumb   PROSTATECTOMY     right below knee amputation  2018   ROUX-EN-Y GASTRIC BYPASS  10/10/2010   laparoscopic   SHOULDER ARTHROSCOPY WITH ROTATOR CUFF REPAIR AND SUBACROMIAL DECOMPRESSION Right 03/23/2020   Procedure: RIGHT SHOULDER ARTHROSCOPY WITH DEBRIDEMENT AND  ROTATOR CUFF REPAIR;  Surgeon: Kathryne Hitch, MD;  Location: [facility] OR;  Service: Orthopedics;  Laterality: Right;   TOE AMPUTATION  02/23/2006   left foot second ray amputation   TOTAL HIP ARTHROPLASTY Right 03/29/2020   Procedure: TOTAL HIP ARTHROPLASTY POSTERIOR APPROACH;  Surgeon: Tarry Kos, MD;  Location: [facility] OR;  Service: Orthopedics;  Laterality: Right;   TRANSMETATARSAL AMPUTATION Left    TRIGGER FINGER RELEASE Right 09/16/2019   Procedure: RELEASE TRIGGER FINGER/A-1 PULLEY;  Surgeon: Cindee Salt, MD;  Location: [facility];  Service: Orthopedics;  Laterality: Right;  IV REGIONAL FOREARM BLOCK   Patient Active Problem List   Diagnosis Date Noted   Paroxysmal atrial fibrillation with RVR (HCC) 08/23/2022   Ascending  aortic aneurysm (HCC) 08/23/2022   Bradycardia 08/22/2022   NSTEMI (non-ST elevated myocardial infarction) (HCC) 08/22/2022   Atrial fibrillation with RVR (HCC) 08/22/2022   Hypotension 01/07/2021   S/P arthroscopy of right shoulder 06/02/2020   Closed fracture of neck of right femur (HCC)    Hip fracture (HCC) 03/27/2020   Nontraumatic complete tear of right rotator cuff 02/26/2020   S/P transmetatarsal amputation of foot, left (HCC) 04/25/2016   Chronic osteomyelitis of toe, left (HCC)    Chronic diastolic CHF (congestive heart failure) (HCC) 09/25/2015   Aortic stenosis 08/04/2014   Diabetic foot infection (HCC) 06/03/2014   Peripheral vascular disease (HCC) 06/03/2014   Coronary artery disease involving native coronary artery of native heart without angina pectoris 07/29/2013   Trifascicular block 07/29/2013   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 07/26/2011   Hypothyroidism 01/22/2007   Type 2 diabetes mellitus with vascular disease (HCC) 01/22/2007   HYPERLIPIDEMIA, MIXED 01/22/2007   MYOCARDIAL INFARCTION, HX OF 01/22/2007   ANEMIA, IRON DEFICIENCY, HX OF 01/22/2007    PCP: Hillard Danker MD  REFERRING PROVIDER: Kirtland Bouchard, PA-C  REFERRING DIAG: M54.2 (ICD-10-CM) - Cervicalgia  THERAPY DIAG:  Cervicalgia  Other symptoms and signs involving the musculoskeletal system  Rationale for Evaluation and Treatment: Rehabilitation  ONSET DATE: 2.5-3 months  SUBJECTIVE:                                                                                                                                                                                                         SUBJECTIVE STATEMENT: It doesn't hurt all the time anymore. I was sitting in the recliner this morning and moved my head with a sharp pain up left side to head- not really sure how I moved to do it.     PERTINENT HISTORY:  Afib, hx NSTEMI, hx transmetatarsal amputation L, R BKA, CHF, PVD,  HLD, Ascending  aortic aneurysm, DM  PAIN:  Are you having pain? Yes: NPRS scale: 4-5 intermittent/10 Pain location: L cervical spine Pain description: sharp Aggravating factors: L rotation Relieving factors: rest  PRECAUTIONS: None  WEIGHT BEARING RESTRICTIONS: No  FALLS:  Has patient fallen in last 6 months? No  PLOF: Independent  PATIENT GOALS: turn to left and not have pain   OBJECTIVE: (objective measures from initial evaluation unless otherwise dated)  DIAGNOSTIC FINDINGS:  XR 09/14/22: Cervical spine 3 views: Shows normal lordotic curvature.  Disc base overall well-maintained.  No acute fractures no spondylolisthesis.  Minimal arthritic changes.   PATIENT SURVEYS:  FOTO 56% function  COGNITION: Overall cognitive status: Within functional limits for tasks assessed  SENSATION: WFL  POSTURE: rounded shoulders, forward head, and increased thoracic kyphosis  PALPATION: TTP bilateral UT, concordant on L, minimal to no tenderness throughout rest of cervical spine; hypomobile and lordotic c/sp  CERVICAL ROM:   Active ROM A/PROM (deg) eval AROM 11/2 Pre//post DN  Flexion 27   Extension 40   Right lateral flexion 30 * 24//30  Left lateral flexion 23 * 14*//30  Right rotation 58 70//70  Left rotation 59 * 60*//64   (Blank rows = not tested) *=pain/symptoms  UPPER EXTREMITY ROM: WFL for tasks assessed  Active ROM Right eval Left eval  Shoulder flexion    Shoulder extension    Shoulder abduction    Shoulder adduction    Shoulder extension    Shoulder internal rotation    Shoulder external rotation    Elbow flexion    Elbow extension    Wrist flexion    Wrist extension    Wrist ulnar deviation    Wrist radial deviation    Wrist pronation    Wrist supination     (Blank rows = not tested) *=pain/symptoms  UPPER EXTREMITY MMT:  MMT Right eval Left eval  Shoulder flexion 4+ 4+  Shoulder extension    Shoulder abduction 4+ 4+  Shoulder adduction    Shoulder  extension    Shoulder internal rotation    Shoulder external rotation    Middle trapezius    Lower trapezius    Elbow flexion 5 5  Elbow extension 5 5  Wrist flexion    Wrist extension    Wrist ulnar deviation    Wrist radial deviation    Wrist pronation    Wrist supination    Grip strength     (Blank rows = not tested) *=pain/symptoms  CERVICAL SPECIAL TESTS:  Distraction test: Negative    TODAY'S TREATMENT:                                                                                                                              Treatment                            11/2:  Trigger Point Dry Needling,  Manual Therapy Treatment:  Initial or subsequent education regarding Trigger Point Dry Needling: Subsequent Did patient give consent to treatment with Trigger Point Dry Needling: Yes TPDN with skilled palpation and monitoring followed by STM to the following muscles: bilat upper traps, bilat suboccipitials STM levator bilat, rib mobs Rt mid-thoracic  Review of scap/cervical retraction   11/22/22 TTP L UT   Manual: STM to L UT pre and post dry needling for trigger point identification and muscular relaxation.  Trigger Point Dry-Needling  Treatment instructions: Expect mild to moderate muscle soreness. S/S of pneumothorax if dry needled over a lung field, and to seek immediate medical attention should they occur. Patient verbalized understanding of these instructions and education.  Patient Consent Given: Yes Education handout provided: Previously provided Muscles treated: L UT Electrical stimulation performed: No Parameters: N/A Treatment response/outcome: twitch response, decrease in tissue tension, decrease in symptoms.  Seated UT stretch 3 x 30 second holds L Supine retraction; tuck+lift 10 x 5 second holds Seated thoracic extension over chair 1 x 10 Seated cervical retractions 1 x 10    10/24 Trigger Point Dry-Needling  Treatment instructions: Expect mild to  moderate muscle soreness. S/S of pneumothorax if dry needled over a lung field, and to seek immediate medical attention should they occur. Patient verbalized understanding of these instructions and education.  Patient Consent Given: Yes Education handout provided: Yes Muscles treated: upper trap 3x using .30x50 needle cervical spine left .30x50 neede 2s pots right upper trap 2 spots  Electrical stimulation performed: No Parameters: N/A Treatment response/outcome: great twitch   Manual: reviewed use of thera-cane for self trigger point release. Skilled palpation of trigger points. Sub-occipital release. Gentle manual traction. Thoracic PA gross mobilization    Bilateral er 2x15 red Bilateral horizontal abduction 2x15 red  Bilateral flexion  2x15 red band    Reviewed HEP and stretching   HOME EXERCISE PROGRAM: Access Code: [email or web address] URL: [email or web address]    ASSESSMENT:  CLINICAL IMPRESSION: Significant improvement in ROM and resolution of pain following treatment. Able to maintain midline posture in cervical retraction.   OBJECTIVE IMPAIRMENTS: decreased activity tolerance, decreased endurance, decreased mobility, decreased ROM, decreased strength, hypomobility, increased muscle spasms, impaired flexibility, impaired UE functional use, improper body mechanics, postural dysfunction, and pain.   ACTIVITY LIMITATIONS: carrying, lifting, bending, reach over head, and caring for others  PARTICIPATION LIMITATIONS: meal prep, cleaning, laundry, driving, shopping, community activity, and yard work  PERSONAL FACTORS: 3+ comorbidities: Afib, hx NSTEMI, hx transmetatarsal, amputation L, CHF, PVD, HLD, Ascending aortic aneurysm, DM  are also affecting patient's functional outcome.   REHAB POTENTIAL: Good  CLINICAL DECISION MAKING: Stable/uncomplicated  EVALUATION COMPLEXITY: Low   GOALS: Goals reviewed with patient? Yes  SHORT TERM GOALS: Target date: 11/15/2022     Patient will be independent with HEP in order to improve functional outcomes. Baseline: Goal status: INITIAL  2.  Patient will report at least 25% improvement in symptoms for improved quality of life. Baseline:  Goal status: INITIAL    LONG TERM GOALS: Target date: 12/06/2022    Patient will report at least 75% improvement in symptoms for improved quality of life. Baseline:  Goal status: INITIAL  2.  Patient will improve FOTO score by at least 11 points in order to indicate improved tolerance to activity. Baseline: 56% function Goal status: INITIAL  3.  Patient will demonstrate at least 25% improvement in cervical ROM in all restricted planes for improved ability to move head while driving. Baseline: see above Goal status:  INITIAL  4.  Patient will be able to return to all activities unrestricted for improved ability to perform work functions and participate with family.  Baseline:  Goal status: INITIAL  5.  Patient will demonstrate grade of 5/5 MMT grade in all tested musculature as evidence of improved strength to assist with lifting at home. Baseline: see above Goal status: INITIAL      PLAN:  PT FREQUENCY: 2x/week  PT DURATION: 6 weeks  PLANNED INTERVENTIONS: Therapeutic exercises, Therapeutic activity, Neuromuscular re-education, Balance training, Gait training, Patient/Family education, Joint manipulation, Joint mobilization, Stair training, Orthotic/Fit training, DME instructions, Aquatic Therapy, Dry Needling, Electrical stimulation, Spinal manipulation, Spinal mobilization, Cryotherapy, Moist heat, Compression bandaging, scar mobilization, Splintting, Taping, Traction, Ultrasound, Ionotophoresis 4mg /ml Dexamethasone, and Manual therapy  PLAN FOR NEXT SESSION: f/u with HEP; possibly DN, postural strength, cervical mobility    Danniell Rotundo C. Libia Fazzini PT, DPT 11/25/22 12:09 PM

## 2022-11-29 ENCOUNTER — Encounter (HOSPITAL_BASED_OUTPATIENT_CLINIC_OR_DEPARTMENT_OTHER): Payer: Self-pay | Admitting: Physical Therapy

## 2022-11-29 ENCOUNTER — Ambulatory Visit (HOSPITAL_BASED_OUTPATIENT_CLINIC_OR_DEPARTMENT_OTHER): Payer: Medicare Other | Admitting: Physical Therapy

## 2022-11-29 DIAGNOSIS — M542 Cervicalgia: Secondary | ICD-10-CM

## 2022-11-29 DIAGNOSIS — R29898 Other symptoms and signs involving the musculoskeletal system: Secondary | ICD-10-CM | POA: Diagnosis not present

## 2022-11-29 NOTE — Therapy (Signed)
OUTPATIENT PHYSICAL THERAPY CERVICAL Treatment   Patient Name: [name] MRN: [id number] DOB:09-Feb-1950, 72 y.o., male Today's Date: 11/29/2022  END OF SESSION:  PT End of Session - 11/29/22 1139     Visit Number 7    Number of Visits 12    Date for PT Re-Evaluation 12/06/22    Authorization Type Medicare    PT Start Time 1145    PT Stop Time 1225    PT Time Calculation (min) 40 min    Activity Tolerance Patient tolerated treatment well    Behavior During Therapy [facility] for tasks assessed/performed                Past Medical History:  Diagnosis Date   Anemia    low iron   Arthritis    Coronary artery disease 2006   a. remote stenting to ramus, prox LAD x2.   Dental crowns present    Diabetes mellitus    First degree AV block    GERD (gastroesophageal reflux disease)    Heart attack (HCC) 06/2004   Heart murmur    aortic   Hx MRSA infection 02/2006   Hyperlipidemia    Hypertension    hx. of - has not been on med. since losing wt. after gastric bypass   Hypothyroidism    Morbid obesity (HCC)    Mucoid cyst of joint 09/2011   left thumb   Sleep apnea    sleep study 03/29/2011; no CPAP use, lost >130lbs   Trifascicular block    Past Surgical History:  Procedure Laterality Date   AMPUTATION Left 04/19/2016   Procedure: Left Foot 4th Toe Amputation vs. Transmetatarsal;  Surgeon: Nadara Mustard, MD;  Location: [facility] OR;  Service: Orthopedics;  Laterality: Left;   BIOPSY  04/07/2019   Procedure: BIOPSY;  Surgeon: Kathi Der, MD;  Location: [facility];  Service: Gastroenterology;;   CATARACT EXTRACTION  02/2010; 03/2010   COLONOSCOPY WITH PROPOFOL N/A 09/14/2014   Procedure: COLONOSCOPY WITH PROPOFOL;  Surgeon: Charolett Bumpers, MD;  Location: [facility];  Service: Endoscopy;  Laterality: N/A;   COLONOSCOPY WITH PROPOFOL N/A 04/07/2019   Procedure: COLONOSCOPY WITH PROPOFOL;  Surgeon: Kathi Der, MD;  Location: [facility];  Service: Gastroenterology;   Laterality: N/A;   CORONARY ANGIOPLASTY WITH STENT PLACEMENT  07/12/2004; 08/03/2004   total of 3 stents   CORONARY STENT INTERVENTION N/A 08/23/2022   Procedure: CORONARY STENT INTERVENTION;  Surgeon: Swaziland, Peter M, MD;  Location: [facility] INVASIVE CV LAB;  Service: Cardiovascular;  Laterality: N/A;   I & D EXTREMITY Right 06/04/2014   Procedure: IRRIGATION AND DEBRIDEMENT EXTREMITY;  Surgeon: Kathryne Hitch, MD;  Location: [facility] OR;  Service: Orthopedics;  Laterality: Right;   LEFT HEART CATH AND CORONARY ANGIOGRAPHY N/A 08/23/2022   Procedure: LEFT HEART CATH AND CORONARY ANGIOGRAPHY;  Surgeon: Swaziland, Peter M, MD;  Location: [facility] INVASIVE CV LAB;  Service: Cardiovascular;  Laterality: N/A;   MASS EXCISION  10/04/2011   Procedure: EXCISION MASS;  Surgeon: Nicki Reaper, MD;  Location: [facility];  Service: Orthopedics;  Laterality: Left;  excision cyst debridment ip joint of left thumb   PROSTATECTOMY     right below knee amputation  2018   ROUX-EN-Y GASTRIC BYPASS  10/10/2010   laparoscopic   SHOULDER ARTHROSCOPY WITH ROTATOR CUFF REPAIR AND SUBACROMIAL DECOMPRESSION Right 03/23/2020   Procedure: RIGHT SHOULDER ARTHROSCOPY WITH DEBRIDEMENT AND  ROTATOR CUFF REPAIR;  Surgeon: Kathryne Hitch, MD;  Location: [facility] OR;  Service: Orthopedics;  Laterality: Right;   TOE AMPUTATION  02/23/2006   left foot second ray amputation   TOTAL HIP ARTHROPLASTY Right 03/29/2020   Procedure: TOTAL HIP ARTHROPLASTY POSTERIOR APPROACH;  Surgeon: Tarry Kos, MD;  Location: [facility] OR;  Service: Orthopedics;  Laterality: Right;   TRANSMETATARSAL AMPUTATION Left    TRIGGER FINGER RELEASE Right 09/16/2019   Procedure: RELEASE TRIGGER FINGER/A-1 PULLEY;  Surgeon: Cindee Salt, MD;  Location: [facility];  Service: Orthopedics;  Laterality: Right;  IV REGIONAL FOREARM BLOCK   Patient Active Problem List   Diagnosis Date Noted   Paroxysmal atrial fibrillation with RVR (HCC) 08/23/2022   Ascending  aortic aneurysm (HCC) 08/23/2022   Bradycardia 08/22/2022   NSTEMI (non-ST elevated myocardial infarction) (HCC) 08/22/2022   Atrial fibrillation with RVR (HCC) 08/22/2022   Hypotension 01/07/2021   S/P arthroscopy of right shoulder 06/02/2020   Closed fracture of neck of right femur (HCC)    Hip fracture (HCC) 03/27/2020   Nontraumatic complete tear of right rotator cuff 02/26/2020   S/P transmetatarsal amputation of foot, left (HCC) 04/25/2016   Chronic osteomyelitis of toe, left (HCC)    Chronic diastolic CHF (congestive heart failure) (HCC) 09/25/2015   Aortic stenosis 08/04/2014   Diabetic foot infection (HCC) 06/03/2014   Peripheral vascular disease (HCC) 06/03/2014   Coronary artery disease involving native coronary artery of native heart without angina pectoris 07/29/2013   Trifascicular block 07/29/2013   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 07/26/2011   Hypothyroidism 01/22/2007   Type 2 diabetes mellitus with vascular disease (HCC) 01/22/2007   HYPERLIPIDEMIA, MIXED 01/22/2007   MYOCARDIAL INFARCTION, HX OF 01/22/2007   ANEMIA, IRON DEFICIENCY, HX OF 01/22/2007    PCP: Hillard Danker MD  REFERRING PROVIDER: Kirtland Bouchard, PA-C  REFERRING DIAG: M54.2 (ICD-10-CM) - Cervicalgia  THERAPY DIAG:  Cervicalgia  Other symptoms and signs involving the musculoskeletal system  Rationale for Evaluation and Treatment: Rehabilitation  ONSET DATE: 2.5-3 months  SUBJECTIVE:                                                                                                                                                                                                         SUBJECTIVE STATEMENT: Patient states things are up and down. Still tenderness with turning to the left. Symptoms are not constant. Feels that it is still improving but just not gone.     PERTINENT HISTORY:  Afib, hx NSTEMI, hx transmetatarsal amputation L, R BKA, CHF, PVD, HLD, Ascending aortic aneurysm,  DM  PAIN:  Are you having pain? Yes: NPRS scale: 4-5 intermittent/10 Pain location: L cervical spine Pain description: sharp Aggravating factors: L rotation Relieving factors: rest  PRECAUTIONS: None  WEIGHT BEARING RESTRICTIONS: No  FALLS:  Has patient fallen in last 6 months? No  PLOF: Independent  PATIENT GOALS: turn to left and not have pain   OBJECTIVE: (objective measures from initial evaluation unless otherwise dated)  DIAGNOSTIC FINDINGS:  XR 09/14/22: Cervical spine 3 views: Shows normal lordotic curvature.  Disc base overall well-maintained.  No acute fractures no spondylolisthesis.  Minimal arthritic changes.   PATIENT SURVEYS:  FOTO 56% function  COGNITION: Overall cognitive status: Within functional limits for tasks assessed  SENSATION: WFL  POSTURE: rounded shoulders, forward head, and increased thoracic kyphosis  PALPATION: TTP bilateral UT, concordant on L, minimal to no tenderness throughout rest of cervical spine; hypomobile and lordotic c/sp  CERVICAL ROM:   Active ROM A/PROM (deg) eval AROM 11/2 Pre//post DN  Flexion 27   Extension 40   Right lateral flexion 30 * 24//30  Left lateral flexion 23 * 14*//30  Right rotation 58 70//70  Left rotation 59 * 60*//64   (Blank rows = not tested) *=pain/symptoms  UPPER EXTREMITY ROM: WFL for tasks assessed  Active ROM Right eval Left eval  Shoulder flexion    Shoulder extension    Shoulder abduction    Shoulder adduction    Shoulder extension    Shoulder internal rotation    Shoulder external rotation    Elbow flexion    Elbow extension    Wrist flexion    Wrist extension    Wrist ulnar deviation    Wrist radial deviation    Wrist pronation    Wrist supination     (Blank rows = not tested) *=pain/symptoms  UPPER EXTREMITY MMT:  MMT Right eval Left eval  Shoulder flexion 4+ 4+  Shoulder extension    Shoulder abduction 4+ 4+  Shoulder adduction    Shoulder extension     Shoulder internal rotation    Shoulder external rotation    Middle trapezius    Lower trapezius    Elbow flexion 5 5  Elbow extension 5 5  Wrist flexion    Wrist extension    Wrist ulnar deviation    Wrist radial deviation    Wrist pronation    Wrist supination    Grip strength     (Blank rows = not tested) *=pain/symptoms  CERVICAL SPECIAL TESTS:  Distraction test: Negative    TODAY'S TREATMENT:                                                                                                                              11/29/22 Manual: STM to L UT pre and post dry needling for trigger point identification and muscular relaxation.; manual UT stretch  Trigger Point Dry-Needling  Treatment instructions: Expect mild to moderate muscle soreness. S/S of pneumothorax if dry needled over a lung  field, and to seek immediate medical attention should they occur. Patient verbalized understanding of these instructions and education.  Patient Consent Given: Yes Education handout provided: Previously provided Muscles treated: L UT Electrical stimulation performed: No Parameters: N/A Treatment response/outcome: twitch response, decrease in tissue tension, decrease in symptoms.  Seated cervical retractions 1 x 10    Treatment                            11/2:  Trigger Point Dry Needling, Manual Therapy Treatment:  Initial or subsequent education regarding Trigger Point Dry Needling: Subsequent Did patient give consent to treatment with Trigger Point Dry Needling: Yes TPDN with skilled palpation and monitoring followed by STM to the following muscles: bilat upper traps, bilat suboccipitials STM levator bilat, rib mobs Rt mid-thoracic  Review of scap/cervical retraction   11/22/22 TTP L UT   Manual: STM to L UT pre and post dry needling for trigger point identification and muscular relaxation.  Trigger Point Dry-Needling  Treatment instructions: Expect mild to moderate muscle  soreness. S/S of pneumothorax if dry needled over a lung field, and to seek immediate medical attention should they occur. Patient verbalized understanding of these instructions and education.  Patient Consent Given: Yes Education handout provided: Previously provided Muscles treated: L UT Electrical stimulation performed: No Parameters: N/A Treatment response/outcome: twitch response, decrease in tissue tension, decrease in symptoms.  Seated UT stretch 3 x 30 second holds L Supine retraction; tuck+lift 10 x 5 second holds Seated thoracic extension over chair 1 x 10 Seated cervical retractions 1 x 10    10/24 Trigger Point Dry-Needling  Treatment instructions: Expect mild to moderate muscle soreness. S/S of pneumothorax if dry needled over a lung field, and to seek immediate medical attention should they occur. Patient verbalized understanding of these instructions and education.  Patient Consent Given: Yes Education handout provided: Yes Muscles treated: upper trap 3x using .30x50 needle cervical spine left .30x50 neede 2s pots right upper trap 2 spots  Electrical stimulation performed: No Parameters: N/A Treatment response/outcome: great twitch   Manual: reviewed use of thera-cane for self trigger point release. Skilled palpation of trigger points. Sub-occipital release. Gentle manual traction. Thoracic PA gross mobilization    Bilateral er 2x15 red Bilateral horizontal abduction 2x15 red  Bilateral flexion  2x15 red band    Reviewed HEP and stretching   HOME EXERCISE PROGRAM: Access Code: [email or web address] URL: [email or web address]    ASSESSMENT:  CLINICAL IMPRESSION: Continued hyperactive and tender in UT. Completed STM and DN. Twitch response with DN several times. Decrease in tissue tension following DN and with STM. Improving cervical retraction mechanics. Patient will continue to benefit from physical therapy in order to improve function and reduce  impairment.   OBJECTIVE IMPAIRMENTS: decreased activity tolerance, decreased endurance, decreased mobility, decreased ROM, decreased strength, hypomobility, increased muscle spasms, impaired flexibility, impaired UE functional use, improper body mechanics, postural dysfunction, and pain.   ACTIVITY LIMITATIONS: carrying, lifting, bending, reach over head, and caring for others  PARTICIPATION LIMITATIONS: meal prep, cleaning, laundry, driving, shopping, community activity, and yard work  PERSONAL FACTORS: 3+ comorbidities: Afib, hx NSTEMI, hx transmetatarsal, amputation L, CHF, PVD, HLD, Ascending aortic aneurysm, DM  are also affecting patient's functional outcome.   REHAB POTENTIAL: Good  CLINICAL DECISION MAKING: Stable/uncomplicated  EVALUATION COMPLEXITY: Low   GOALS: Goals reviewed with patient? Yes  SHORT TERM GOALS: Target date: 11/15/2022    Patient will be independent with HEP  in order to improve functional outcomes. Baseline: Goal status: INITIAL  2.  Patient will report at least 25% improvement in symptoms for improved quality of life. Baseline:  Goal status: INITIAL    LONG TERM GOALS: Target date: 12/06/2022    Patient will report at least 75% improvement in symptoms for improved quality of life. Baseline:  Goal status: INITIAL  2.  Patient will improve FOTO score by at least 11 points in order to indicate improved tolerance to activity. Baseline: 56% function Goal status: INITIAL  3.  Patient will demonstrate at least 25% improvement in cervical ROM in all restricted planes for improved ability to move head while driving. Baseline: see above Goal status: INITIAL  4.  Patient will be able to return to all activities unrestricted for improved ability to perform work functions and participate with family.  Baseline:  Goal status: INITIAL  5.  Patient will demonstrate grade of 5/5 MMT grade in all tested musculature as evidence of improved strength to  assist with lifting at home. Baseline: see above Goal status: INITIAL      PLAN:  PT FREQUENCY: 2x/week  PT DURATION: 6 weeks  PLANNED INTERVENTIONS: Therapeutic exercises, Therapeutic activity, Neuromuscular re-education, Balance training, Gait training, Patient/Family education, Joint manipulation, Joint mobilization, Stair training, Orthotic/Fit training, DME instructions, Aquatic Therapy, Dry Needling, Electrical stimulation, Spinal manipulation, Spinal mobilization, Cryotherapy, Moist heat, Compression bandaging, scar mobilization, Splintting, Taping, Traction, Ultrasound, Ionotophoresis 4mg /ml Dexamethasone, and Manual therapy  PLAN FOR NEXT SESSION: f/u with HEP; possibly DN, postural strength, cervical mobility     Wyman Songster, PT 11/29/2022, 11:40 AM

## 2022-12-01 ENCOUNTER — Encounter (HOSPITAL_BASED_OUTPATIENT_CLINIC_OR_DEPARTMENT_OTHER): Payer: Self-pay | Admitting: Physical Therapy

## 2022-12-01 ENCOUNTER — Ambulatory Visit (HOSPITAL_BASED_OUTPATIENT_CLINIC_OR_DEPARTMENT_OTHER): Payer: Medicare Other | Admitting: Physical Therapy

## 2022-12-01 DIAGNOSIS — R29898 Other symptoms and signs involving the musculoskeletal system: Secondary | ICD-10-CM | POA: Diagnosis not present

## 2022-12-01 DIAGNOSIS — M542 Cervicalgia: Secondary | ICD-10-CM

## 2022-12-01 NOTE — Therapy (Signed)
OUTPATIENT PHYSICAL THERAPY CERVICAL Treatment   Patient Name: LANGLEY KROM MRN: [id number] DOB:20-Jan-1951, 72 y.o., male Today's Date: 12/01/2022  END OF SESSION:  PT End of Session - 12/01/22 1135     Visit Number 8    Number of Visits 12    Date for PT Re-Evaluation 12/06/22    Authorization Type Medicare    PT Start Time 1145    PT Stop Time 1225    PT Time Calculation (min) 40 min    Activity Tolerance Patient tolerated treatment well    Behavior During Therapy [facility] for tasks assessed/performed                Past Medical History:  Diagnosis Date   Anemia    low iron   Arthritis    Coronary artery disease 2006   a. remote stenting to ramus, prox LAD x2.   Dental crowns present    Diabetes mellitus    First degree AV block    GERD (gastroesophageal reflux disease)    Heart attack (HCC) 06/2004   Heart murmur    aortic   Hx MRSA infection 02/2006   Hyperlipidemia    Hypertension    hx. of - has not been on med. since losing wt. after gastric bypass   Hypothyroidism    Morbid obesity (HCC)    Mucoid cyst of joint 09/2011   left thumb   Sleep apnea    sleep study 03/29/2011; no CPAP use, lost >130lbs   Trifascicular block    Past Surgical History:  Procedure Laterality Date   AMPUTATION Left 04/19/2016   Procedure: Left Foot 4th Toe Amputation vs. Transmetatarsal;  Surgeon: Nadara Mustard, MD;  Location: [facility] OR;  Service: Orthopedics;  Laterality: Left;   BIOPSY  04/07/2019   Procedure: BIOPSY;  Surgeon: Kathi Der, MD;  Location: [facility];  Service: Gastroenterology;;   CATARACT EXTRACTION  02/2010; 03/2010   COLONOSCOPY WITH PROPOFOL N/A 09/14/2014   Procedure: COLONOSCOPY WITH PROPOFOL;  Surgeon: Charolett Bumpers, MD;  Location: [facility];  Service: Endoscopy;  Laterality: N/A;   COLONOSCOPY WITH PROPOFOL N/A 04/07/2019   Procedure: COLONOSCOPY WITH PROPOFOL;  Surgeon: Kathi Der, MD;  Location: [facility];  Service: Gastroenterology;   Laterality: N/A;   CORONARY ANGIOPLASTY WITH STENT PLACEMENT  07/12/2004; 08/03/2004   total of 3 stents   CORONARY STENT INTERVENTION N/A 08/23/2022   Procedure: CORONARY STENT INTERVENTION;  Surgeon: Swaziland, Peter M, MD;  Location: [facility] INVASIVE CV LAB;  Service: Cardiovascular;  Laterality: N/A;   I & D EXTREMITY Right 06/04/2014   Procedure: IRRIGATION AND DEBRIDEMENT EXTREMITY;  Surgeon: Kathryne Hitch, MD;  Location: [facility] OR;  Service: Orthopedics;  Laterality: Right;   LEFT HEART CATH AND CORONARY ANGIOGRAPHY N/A 08/23/2022   Procedure: LEFT HEART CATH AND CORONARY ANGIOGRAPHY;  Surgeon: Swaziland, Peter M, MD;  [facility] INVAS[facility] CV LAB;  Service: Cardiovascular;  Laterality: N/A;   MASS EXCISION  10/04/2011   Procedure: EXCISION MASS;  Surgeon: Nicki Reaper, MD;  Location: [facility];  Service: Orthopedics;  Laterality: Left;  excision cyst debridment ip joint of left thumb   PROSTATECTOMY     right below knee amputation  2018   ROUX-EN-Y GASTRIC BYPASS  10/10/2010   laparoscopic   SHOULDER ARTHROSCOPY WITH ROTATOR CUFF REPAIR AND SUBACROMIAL DECOMPRESSION Right 03/23/2020   Procedure: RIGHT SHOULDER ARTHROSCOPY WITH DEBRIDEMENT AND  ROTATOR CUFF REPAIR;  Surgeon: Kathryne Hitch, MD;  Location: [facility] OR;  Service: Orthopedics;  Laterality: Right;   TOE AMPUTATION  02/23/2006   left foot second ray amputation   TOTAL HIP ARTHROPLASTY Right 03/29/2020   Procedure: TOTAL HIP ARTHROPLASTY POSTERIOR APPROACH;  Surgeon: Tarry Kos, MD;  Location: [facility] OR;  Service: Orthopedics;  Laterality: Right;   TRANSMETATARSAL AMPUTATION Left    TRIGGER FINGER RELEASE Right 09/16/2019   Procedure: RELEASE TRIGGER FINGER/A-1 PULLEY;  Surgeon: Cindee Salt, MD;  Location: [facility];  Service: Orthopedics;  Laterality: Right;  IV REGIONAL FOREARM BLOCK   Patient Active Problem List   Diagnosis Date Noted   Paroxysmal atrial fibrillation with RVR (HCC) 08/23/2022   Ascending  aortic aneurysm (HCC) 08/23/2022   Bradycardia 08/22/2022   NSTEMI (non-ST elevated myocardial infarction) (HCC) 08/22/2022   Atrial fibrillation with RVR (HCC) 08/22/2022   Hypotension 01/07/2021   S/P arthroscopy of right shoulder 06/02/2020   Closed fracture of neck of right femur (HCC)    Hip fracture (HCC) 03/27/2020   Nontraumatic complete tear of right rotator cuff 02/26/2020   S/P transmetatarsal amputation of foot, left (HCC) 04/25/2016   Chronic osteomyelitis of toe, left (HCC)    Chronic diastolic CHF (congestive heart failure) (HCC) 09/25/2015   Aortic stenosis 08/04/2014   Diabetic foot infection (HCC) 06/03/2014   Peripheral vascular disease (HCC) 06/03/2014   Coronary artery disease involving native coronary artery of native heart without angina pectoris 07/29/2013   Trifascicular block 07/29/2013   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 07/26/2011   Hypothyroidism 01/22/2007   Type 2 diabetes mellitus with vascular disease (HCC) 01/22/2007   HYPERLIPIDEMIA, MIXED 01/22/2007   MYOCARDIAL INFARCTION, HX OF 01/22/2007   ANEMIA, IRON DEFICIENCY, HX OF 01/22/2007    PCP: Hillard Danker MD  REFERRING PROVIDER: Kirtland Bouchard, PA-C  REFERRING DIAG: M54.2 (ICD-10-CM) - Cervicalgia  THERAPY DIAG:  Cervicalgia  Other symptoms and signs involving the musculoskeletal system  Rationale for Evaluation and Treatment: Rehabilitation  ONSET DATE: 2.5-3 months  SUBJECTIVE:                                                                                                                                                                                                         SUBJECTIVE STATEMENT: Patient states was really sore yesterday in upper trap. Doing better today but still sore.     PERTINENT HISTORY:  Afib, hx NSTEMI, hx transmetatarsal amputation L, R BKA, CHF, PVD, HLD, Ascending aortic aneurysm, DM  PAIN:  Are you having pain? Yes: NPRS scale: 4-5  intermittent/10 Pain location: L cervical  spine Pain description: sharp Aggravating factors: L rotation Relieving factors: rest  PRECAUTIONS: None  WEIGHT BEARING RESTRICTIONS: No  FALLS:  Has patient fallen in last 6 months? No  PLOF: Independent  PATIENT GOALS: turn to left and not have pain   OBJECTIVE: (objective measures from initial evaluation unless otherwise dated)  DIAGNOSTIC FINDINGS:  XR 09/14/22: Cervical spine 3 views: Shows normal lordotic curvature.  Disc base overall well-maintained.  No acute fractures no spondylolisthesis.  Minimal arthritic changes.   PATIENT SURVEYS:  FOTO 56% function  COGNITION: Overall cognitive status: Within functional limits for tasks assessed  SENSATION: WFL  POSTURE: rounded shoulders, forward head, and increased thoracic kyphosis  PALPATION: TTP bilateral UT, concordant on L, minimal to no tenderness throughout rest of cervical spine; hypomobile and lordotic c/sp  CERVICAL ROM:   Active ROM A/PROM (deg) eval AROM 11/2 Pre//post DN  Flexion 27   Extension 40   Right lateral flexion 30 * 24//30  Left lateral flexion 23 * 14*//30  Right rotation 58 70//70  Left rotation 59 * 60*//64   (Blank rows = not tested) *=pain/symptoms  UPPER EXTREMITY ROM: WFL for tasks assessed  Active ROM Right eval Left eval  Shoulder flexion    Shoulder extension    Shoulder abduction    Shoulder adduction    Shoulder extension    Shoulder internal rotation    Shoulder external rotation    Elbow flexion    Elbow extension    Wrist flexion    Wrist extension    Wrist ulnar deviation    Wrist radial deviation    Wrist pronation    Wrist supination     (Blank rows = not tested) *=pain/symptoms  UPPER EXTREMITY MMT:  MMT Right eval Left eval  Shoulder flexion 4+ 4+  Shoulder extension    Shoulder abduction 4+ 4+  Shoulder adduction    Shoulder extension    Shoulder internal rotation    Shoulder external rotation     Middle trapezius    Lower trapezius    Elbow flexion 5 5  Elbow extension 5 5  Wrist flexion    Wrist extension    Wrist ulnar deviation    Wrist radial deviation    Wrist pronation    Wrist supination    Grip strength     (Blank rows = not tested) *=pain/symptoms  CERVICAL SPECIAL TESTS:  Distraction test: Negative    TODAY'S TREATMENT:                                                                                                                              12/01/22 Manual: STM to L UT Seated cervical retractions 2 x 10  Seated L rotation SNAG 2 x 10 Standing row BTB 3 x 10   11/29/22 Manual: STM to L UT pre and post dry needling for trigger point identification and muscular relaxation.; manual UT stretch  Trigger Point Dry-Needling  Treatment instructions:  Expect mild to moderate muscle soreness. S/S of pneumothorax if dry needled over a lung field, and to seek immediate medical attention should they occur. Patient verbalized understanding of these instructions and education.  Patient Consent Given: Yes Education handout provided: Previously provided Muscles treated: L UT Electrical stimulation performed: No Parameters: N/A Treatment response/outcome: twitch response, decrease in tissue tension, decrease in symptoms.  Seated cervical retractions 1 x 10    Treatment                            11/2:  Trigger Point Dry Needling, Manual Therapy Treatment:  Initial or subsequent education regarding Trigger Point Dry Needling: Subsequent Did patient give consent to treatment with Trigger Point Dry Needling: Yes TPDN with skilled palpation and monitoring followed by STM to the following muscles: bilat upper traps, bilat suboccipitials STM levator bilat, rib mobs Rt mid-thoracic  Review of scap/cervical retraction   11/22/22 TTP L UT   Manual: STM to L UT pre and post dry needling for trigger point identification and muscular relaxation.  Trigger Point Dry-Needling   Treatment instructions: Expect mild to moderate muscle soreness. S/S of pneumothorax if dry needled over a lung field, and to seek immediate medical attention should they occur. Patient verbalized understanding of these instructions and education.  Patient Consent Given: Yes Education handout provided: Previously provided Muscles treated: L UT Electrical stimulation performed: No Parameters: N/A Treatment response/outcome: twitch response, decrease in tissue tension, decrease in symptoms.  Seated UT stretch 3 x 30 second holds L Supine retraction; tuck+lift 10 x 5 second holds Seated thoracic extension over chair 1 x 10 Seated cervical retractions 1 x 10    10/24 Trigger Point Dry-Needling  Treatment instructions: Expect mild to moderate muscle soreness. S/S of pneumothorax if dry needled over a lung field, and to seek immediate medical attention should they occur. Patient verbalized understanding of these instructions and education.  Patient Consent Given: Yes Education handout provided: Yes Muscles treated: upper trap 3x using .30x50 needle cervical spine left .30x50 neede 2s pots right upper trap 2 spots  Electrical stimulation performed: No Parameters: N/A Treatment response/outcome: great twitch   Manual: reviewed use of thera-cane for self trigger point release. Skilled palpation of trigger points. Sub-occipital release. Gentle manual traction. Thoracic PA gross mobilization    Bilateral er 2x15 red Bilateral horizontal abduction 2x15 red  Bilateral flexion  2x15 red band    Reviewed HEP and stretching   HOME EXERCISE PROGRAM: Access Code: [email or web address] URL: [email or web address]    ASSESSMENT:  CLINICAL IMPRESSION: Continued hyperactive and tender in UT. Completed STM with Decrease in tissue tension following with improved ROM. Improving cervical retraction mechanics. Additional postural strengthening performed today. Patient will continue to benefit from  physical therapy in order to improve function and reduce impairment.   OBJECTIVE IMPAIRMENTS: decreased activity tolerance, decreased endurance, decreased mobility, decreased ROM, decreased strength, hypomobility, increased muscle spasms, impaired flexibility, impaired UE functional use, improper body mechanics, postural dysfunction, and pain.   ACTIVITY LIMITATIONS: carrying, lifting, bending, reach over head, and caring for others  PARTICIPATION LIMITATIONS: meal prep, cleaning, laundry, driving, shopping, community activity, and yard work  PERSONAL FACTORS: 3+ comorbidities: Afib, hx NSTEMI, hx transmetatarsal, amputation L, CHF, PVD, HLD, Ascending aortic aneurysm, DM  are also affecting patient's functional outcome.   REHAB POTENTIAL: Good  CLINICAL DECISION MAKING: Stable/uncomplicated  EVALUATION COMPLEXITY: Low   GOALS: Goals reviewed with patient? Yes  SHORT TERM GOALS:  Target date: 11/15/2022    Patient will be independent with HEP in order to improve functional outcomes. Baseline: Goal status: INITIAL  2.  Patient will report at least 25% improvement in symptoms for improved quality of life. Baseline:  Goal status: INITIAL    LONG TERM GOALS: Target date: 12/06/2022    Patient will report at least 75% improvement in symptoms for improved quality of life. Baseline:  Goal status: INITIAL  2.  Patient will improve FOTO score by at least 11 points in order to indicate improved tolerance to activity. Baseline: 56% function Goal status: INITIAL  3.  Patient will demonstrate at least 25% improvement in cervical ROM in all restricted planes for improved ability to move head while driving. Baseline: see above Goal status: INITIAL  4.  Patient will be able to return to all activities unrestricted for improved ability to perform work functions and participate with family.  Baseline:  Goal status: INITIAL  5.  Patient will demonstrate grade of 5/5 MMT grade in all  tested musculature as evidence of improved strength to assist with lifting at home. Baseline: see above Goal status: INITIAL      PLAN:  PT FREQUENCY: 2x/week  PT DURATION: 6 weeks  PLANNED INTERVENTIONS: Therapeutic exercises, Therapeutic activity, Neuromuscular re-education, Balance training, Gait training, Patient/Family education, Joint manipulation, Joint mobilization, Stair training, Orthotic/Fit training, DME instructions, Aquatic Therapy, Dry Needling, Electrical stimulation, Spinal manipulation, Spinal mobilization, Cryotherapy, Moist heat, Compression bandaging, scar mobilization, Splintting, Taping, Traction, Ultrasound, Ionotophoresis 4mg /ml Dexamethasone, and Manual therapy  PLAN FOR NEXT SESSION: f/u with HEP; possibly DN, postural strength, cervical mobility     Wyman Songster, PT 12/01/2022, 11:37 AM

## 2022-12-06 ENCOUNTER — Encounter (HOSPITAL_BASED_OUTPATIENT_CLINIC_OR_DEPARTMENT_OTHER): Payer: Self-pay | Admitting: Physical Therapy

## 2022-12-06 ENCOUNTER — Ambulatory Visit (HOSPITAL_BASED_OUTPATIENT_CLINIC_OR_DEPARTMENT_OTHER): Payer: Medicare Other | Admitting: Physical Therapy

## 2022-12-06 DIAGNOSIS — R29898 Other symptoms and signs involving the musculoskeletal system: Secondary | ICD-10-CM | POA: Diagnosis not present

## 2022-12-06 DIAGNOSIS — M542 Cervicalgia: Secondary | ICD-10-CM | POA: Diagnosis not present

## 2022-12-06 NOTE — Therapy (Signed)
OUTPATIENT PHYSICAL THERAPY CERVICAL Treatment   Patient Name: [name] MRN: [id number] DOB:[birth year], 72 y.o., male Today's Date: 12/06/2022  Progress Note   Reporting Period 10/25/22 to 12/06/22   See note below for Objective Data and Assessment of Progress/Goals    END OF SESSION:  PT End of Session - 12/06/22 1148     Visit Number 9    Number of Visits 24    Date for PT Re-Evaluation 01/17/23    Authorization Type Medicare    Progress Note Due on Visit 19    PT Start Time 1149    PT Stop Time 1230    PT Time Calculation (min) 41 min    Activity Tolerance Patient tolerated treatment well    Behavior During Therapy [facility] for tasks assessed/performed                Past Medical History:  Diagnosis Date   Anemia    low iron   Arthritis    Coronary artery disease 2006   a. remote stenting to ramus, prox LAD x2.   Dental crowns present    Diabetes mellitus    First degree AV block    GERD (gastroesophageal reflux disease)    Heart attack (HCC) 06/2004   Heart murmur    aortic   Hx MRSA infection 02/2006   Hyperlipidemia    Hypertension    hx. of - has not been on med. since losing wt. after gastric bypass   Hypothyroidism    Morbid obesity (HCC)    Mucoid cyst of joint 09/2011   left thumb   Sleep apnea    sleep study 03/29/2011; no CPAP use, lost >130lbs   Trifascicular block    Past Surgical History:  Procedure Laterality Date   AMPUTATION Left 04/19/2016   Procedure: Left Foot 4th Toe Amputation vs. Transmetatarsal;  Surgeon: Nadara Mustard, MD;  Location: [facility] OR;  Service: Orthopedics;  Laterality: Left;   BIOPSY  04/07/2019   Procedure: BIOPSY;  Surgeon: Kathi Der, MD;  Location: [facility];  Service: Gastroenterology;;   CATARACT EXTRACTION  02/2010; 03/2010   COLONOSCOPY WITH PROPOFOL N/A 09/14/2014   Procedure: COLONOSCOPY WITH PROPOFOL;  Surgeon: Charolett Bumpers, MD;  Location: [facility];  Service: Endoscopy;  Laterality: N/A;    COLONOSCOPY WITH PROPOFOL N/A 04/07/2019   Procedure: COLONOSCOPY WITH PROPOFOL;  Surgeon: Kathi Der, MD;  Location: [facility];  Service: Gastroenterology;  Laterality: N/A;   CORONARY ANGIOPLASTY WITH STENT PLACEMENT  07/12/2004; 08/03/2004   total of 3 stents   CORONARY STENT INTERVENTION N/A 08/23/2022   Procedure: CORONARY STENT INTERVENTION;  Surgeon: Swaziland, Peter M, MD;  Location: [facility] INVASIVE CV LAB;  Service: Cardiovascular;  Laterality: N/A;   I & D EXTREMITY Right 06/04/2014   Procedure: IRRIGATION AND DEBRIDEMENT EXTREMITY;  Surgeon: Kathryne Hitch, MD;  Location: [facility] OR;  Service: Orthopedics;  Laterality: Right;   LEFT HEART CATH AND CORONARY ANGIOGRAPHY N/A 08/23/2022   Procedure: LEFT HEART CATH AND CORONARY ANGIOGRAPHY;  Surgeon: Swaziland, Peter M, MD;  Location: [facility] INVASIVE CV LAB;  Service: Cardiovascular;  Laterality: N/A;   MASS EXCISION  10/04/2011   Procedure: EXCISION MASS;  Surgeon: Nicki Reaper, MD;  Location: [facility];  Service: Orthopedics;  Laterality: Left;  excision cyst debridment ip joint of left thumb   PROSTATECTOMY     right below knee amputation  2018   ROUX-EN-Y GASTRIC BYPASS  10/10/2010   laparoscopic   SHOULDER  ARTHROSCOPY WITH ROTATOR CUFF REPAIR AND SUBACROMIAL DECOMPRESSION Right 03/23/2020   Procedure: RIGHT SHOULDER ARTHROSCOPY WITH DEBRIDEMENT AND  ROTATOR CUFF REPAIR;  Surgeon: Kathryne Hitch, MD;  Location: [facility] OR;  Service: Orthopedics;  Laterality: Right;   TOE AMPUTATION  02/23/2006   left foot second ray amputation   TOTAL HIP ARTHROPLASTY Right 03/29/2020   Procedure: TOTAL HIP ARTHROPLASTY POSTERIOR APPROACH;  Surgeon: Tarry Kos, MD;  Location: [facility] OR;  Service: Orthopedics;  Laterality: Right;   TRANSMETATARSAL AMPUTATION Left    TRIGGER FINGER RELEASE Right 09/16/2019   Procedure: RELEASE TRIGGER FINGER/A-1 PULLEY;  Surgeon: Cindee Salt, MD;  Location: [facility];  Service: Orthopedics;   Laterality: Right;  IV REGIONAL FOREARM BLOCK   Patient Active Problem List   Diagnosis Date Noted   Paroxysmal atrial fibrillation with RVR (HCC) 08/23/2022   Ascending aortic aneurysm (HCC) 08/23/2022   Bradycardia 08/22/2022   NSTEMI (non-ST elevated myocardial infarction) (HCC) 08/22/2022   Atrial fibrillation with RVR (HCC) 08/22/2022   Hypotension 01/07/2021   S/P arthroscopy of right shoulder 06/02/2020   Closed fracture of neck of right femur (HCC)    Hip fracture (HCC) 03/27/2020   Nontraumatic complete tear of right rotator cuff 02/26/2020   S/P transmetatarsal amputation of foot, left (HCC) 04/25/2016   Chronic osteomyelitis of toe, left (HCC)    Chronic diastolic CHF (congestive heart failure) (HCC) 09/25/2015   Aortic stenosis 08/04/2014   Diabetic foot infection (HCC) 06/03/2014   Peripheral vascular disease (HCC) 06/03/2014   Coronary artery disease involving native coronary artery of native heart without angina pectoris 07/29/2013   Trifascicular block 07/29/2013   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 07/26/2011   Hypothyroidism 01/22/2007   Type 2 diabetes mellitus with vascular disease (HCC) 01/22/2007   HYPERLIPIDEMIA, MIXED 01/22/2007   MYOCARDIAL INFARCTION, HX OF 01/22/2007   ANEMIA, IRON DEFICIENCY, HX OF 01/22/2007    PCP: Hillard Danker MD  REFERRING PROVIDER: Kirtland Bouchard, PA-C  REFERRING DIAG: M54.2 (ICD-10-CM) - Cervicalgia  THERAPY DIAG:  Cervicalgia  Other symptoms and signs involving the musculoskeletal system  Rationale for Evaluation and Treatment: Rehabilitation  ONSET DATE: 2.5-3 months  SUBJECTIVE:                                                                                                                                                                                                         SUBJECTIVE STATEMENT: Patient states symptoms come and go. Symptoms are not as constant as they were. Still pain with turning to L. Not  as  good as he hopes it could be. HEP going well. 50-60% improvement with PT intervention. Remaining deficit is with symptoms with rotation. Had pain in neck at dentist and with driving. Shoulders have been somewhat sore.     PERTINENT HISTORY:  Afib, hx NSTEMI, hx transmetatarsal amputation L, R BKA, CHF, PVD, HLD, Ascending aortic aneurysm, DM  PAIN:  Are you having pain? Yes: NPRS scale: 4-5 intermittent/10 Pain location: L cervical spine Pain description: sharp Aggravating factors: L rotation Relieving factors: rest  PRECAUTIONS: None  WEIGHT BEARING RESTRICTIONS: No  FALLS:  Has patient fallen in last 6 months? No  PLOF: Independent  PATIENT GOALS: turn to left and not have pain   OBJECTIVE: (objective measures from initial evaluation unless otherwise dated)  DIAGNOSTIC FINDINGS:  XR 09/14/22: Cervical spine 3 views: Shows normal lordotic curvature.  Disc base overall well-maintained.  No acute fractures no spondylolisthesis.  Minimal arthritic changes.   PATIENT SURVEYS:  FOTO 56% function  12/06/22: 54 % function  COGNITION: Overall cognitive status: Within functional limits for tasks assessed  SENSATION: WFL  POSTURE: rounded shoulders, forward head, and increased thoracic kyphosis  PALPATION: TTP bilateral UT, concordant on L, minimal to no tenderness throughout rest of cervical spine; hypomobile and lordotic c/sp  CERVICAL ROM:   Active ROM A/PROM (deg) eval AROM 11/2 Pre//post DN 12/06/22  Flexion 27  35  Extension 40  36  Right lateral flexion 30 * 24//30 21  Left lateral flexion 23 * 14*//30 13  Right rotation 58 70//70 79  Left rotation 59 * 60*//64 72   (Blank rows = not tested) *=pain/symptoms  UPPER EXTREMITY ROM: WFL for tasks assessed  Active ROM Right eval Left eval  Shoulder flexion    Shoulder extension    Shoulder abduction    Shoulder adduction    Shoulder extension    Shoulder internal rotation    Shoulder external rotation     Elbow flexion    Elbow extension    Wrist flexion    Wrist extension    Wrist ulnar deviation    Wrist radial deviation    Wrist pronation    Wrist supination     (Blank rows = not tested) *=pain/symptoms  UPPER EXTREMITY MMT:  MMT Right eval Left eval Right 12/06/22 Left 12/06/22  Shoulder flexion 4+ 4+ 4+ 4+  Shoulder extension      Shoulder abduction 4+ 4+ 4+ 4+  Shoulder adduction      Shoulder extension      Shoulder internal rotation      Shoulder external rotation      Middle trapezius      Lower trapezius      Elbow flexion 5 5    Elbow extension 5 5    Wrist flexion      Wrist extension      Wrist ulnar deviation      Wrist radial deviation      Wrist pronation      Wrist supination      Grip strength       (Blank rows = not tested) *=pain/symptoms  CERVICAL SPECIAL TESTS:  Distraction test: Negative    TODAY'S TREATMENT:  12/06/22 Reassessment TTP and hyperactive L UT Seated L rotation SNAG 2 x 10 Standing row BTB 3 x 10  12/01/22 Manual: STM to L UT Seated cervical retractions 2 x 10  Seated L rotation SNAG 2 x 10 Standing row BTB 3 x 10   11/29/22 Manual: STM to L UT pre and post dry needling for trigger point identification and muscular relaxation.; manual UT stretch  Trigger Point Dry-Needling  Treatment instructions: Expect mild to moderate muscle soreness. S/S of pneumothorax if dry needled over a lung field, and to seek immediate medical attention should they occur. Patient verbalized understanding of these instructions and education.  Patient Consent Given: Yes Education handout provided: Previously provided Muscles treated: L UT Electrical stimulation performed: No Parameters: N/A Treatment response/outcome: twitch response, decrease in tissue tension, decrease in symptoms.  Seated cervical retractions 1 x 10     Treatment                            11/2:  Trigger Point Dry Needling, Manual Therapy Treatment:  Initial or subsequent education regarding Trigger Point Dry Needling: Subsequent Did patient give consent to treatment with Trigger Point Dry Needling: Yes TPDN with skilled palpation and monitoring followed by STM to the following muscles: bilat upper traps, bilat suboccipitials STM levator bilat, rib mobs Rt mid-thoracic  Review of scap/cervical retraction   11/22/22 TTP L UT   Manual: STM to L UT pre and post dry needling for trigger point identification and muscular relaxation.  Trigger Point Dry-Needling  Treatment instructions: Expect mild to moderate muscle soreness. S/S of pneumothorax if dry needled over a lung field, and to seek immediate medical attention should they occur. Patient verbalized understanding of these instructions and education.  Patient Consent Given: Yes Education handout provided: Previously provided Muscles treated: L UT Electrical stimulation performed: No Parameters: N/A Treatment response/outcome: twitch response, decrease in tissue tension, decrease in symptoms.  Seated UT stretch 3 x 30 second holds L Supine retraction; tuck+lift 10 x 5 second holds Seated thoracic extension over chair 1 x 10 Seated cervical retractions 1 x 10    10/24 Trigger Point Dry-Needling  Treatment instructions: Expect mild to moderate muscle soreness. S/S of pneumothorax if dry needled over a lung field, and to seek immediate medical attention should they occur. Patient verbalized understanding of these instructions and education.  Patient Consent Given: Yes Education handout provided: Yes Muscles treated: upper trap 3x using .30x50 needle cervical spine left .30x50 neede 2s pots right upper trap 2 spots  Electrical stimulation performed: No Parameters: N/A Treatment response/outcome: great twitch   Manual: reviewed use of thera-cane for self trigger point  release. Skilled palpation of trigger points. Sub-occipital release. Gentle manual traction. Thoracic PA gross mobilization    Bilateral er 2x15 red Bilateral horizontal abduction 2x15 red  Bilateral flexion  2x15 red band    Reviewed HEP and stretching   HOME EXERCISE PROGRAM: Access Code: [email or web address] URL: [email or web address]    ASSESSMENT:  CLINICAL IMPRESSION: Patient has met 2/2 short term goals and 0/5 long term goals with ability to complete HEP and improvement in symptoms. Remaining goals not met due to continued deficits in symptoms, posture, strength, ROM, activity tolerance, and functional mobility. Patient has made good progress toward remaining goals. Extending POC 1-2x/week for 6 weeks. Continued hyperactive and tender in UT. Patient will continue to benefit from skilled physical therapy in order to improve function and reduce impairment.  OBJECTIVE IMPAIRMENTS: decreased activity tolerance, decreased endurance, decreased mobility, decreased ROM, decreased strength, hypomobility, increased muscle spasms, impaired flexibility, impaired UE functional use, improper body mechanics, postural dysfunction, and pain.   ACTIVITY LIMITATIONS: carrying, lifting, bending, reach over head, and caring for others  PARTICIPATION LIMITATIONS: meal prep, cleaning, laundry, driving, shopping, community activity, and yard work  PERSONAL FACTORS: 3+ comorbidities: Afib, hx NSTEMI, hx transmetatarsal, amputation L, CHF, PVD, HLD, Ascending aortic aneurysm, DM  are also affecting patient's functional outcome.   REHAB POTENTIAL: Good  CLINICAL DECISION MAKING: Stable/uncomplicated  EVALUATION COMPLEXITY: Low   GOALS: Goals reviewed with patient? Yes  SHORT TERM GOALS: Target date: 11/15/2022    Patient will be independent with HEP in order to improve functional outcomes. Baseline: Goal status: MET  2.  Patient will report at least 25% improvement in symptoms  for improved quality of life. Baseline:  Goal status: MET    LONG TERM GOALS: Target date: 12/06/2022    Patient will report at least 75% improvement in symptoms for improved quality of life. Baseline:  Goal status: INITIAL  2.  Patient will improve FOTO score by at least 11 points in order to indicate improved tolerance to activity. Baseline: 56% function 12/06/22: 54% function Goal status: INITIAL  3.  Patient will demonstrate at least 25% improvement in cervical ROM in all restricted planes for improved ability to move head while driving. Baseline: see above Goal status: INITIAL  4.  Patient will be able to return to all activities unrestricted for improved ability to perform work functions and participate with family.  Baseline:  Goal status: INITIAL  5.  Patient will demonstrate grade of 5/5 MMT grade in all tested musculature as evidence of improved strength to assist with lifting at home. Baseline: see above Goal status: INITIAL      PLAN:  PT FREQUENCY: 1-2x/week  PT DURATION: 6 weeks  PLANNED INTERVENTIONS: Therapeutic exercises, Therapeutic activity, Neuromuscular re-education, Balance training, Gait training, Patient/Family education, Joint manipulation, Joint mobilization, Stair training, Orthotic/Fit training, DME instructions, Aquatic Therapy, Dry Needling, Electrical stimulation, Spinal manipulation, Spinal mobilization, Cryotherapy, Moist heat, Compression bandaging, scar mobilization, Splintting, Taping, Traction, Ultrasound, Ionotophoresis 4mg /ml Dexamethasone, and Manual therapy  PLAN FOR NEXT SESSION: f/u with HEP; possibly DN, postural strength, cervical mobility     Wyman Songster, PT 12/06/2022, 12:31 PM

## 2022-12-08 ENCOUNTER — Encounter (HOSPITAL_BASED_OUTPATIENT_CLINIC_OR_DEPARTMENT_OTHER): Payer: Self-pay | Admitting: Physical Therapy

## 2022-12-08 ENCOUNTER — Ambulatory Visit (HOSPITAL_BASED_OUTPATIENT_CLINIC_OR_DEPARTMENT_OTHER): Payer: Medicare Other | Admitting: Physical Therapy

## 2022-12-08 DIAGNOSIS — R29898 Other symptoms and signs involving the musculoskeletal system: Secondary | ICD-10-CM | POA: Diagnosis not present

## 2022-12-08 DIAGNOSIS — M542 Cervicalgia: Secondary | ICD-10-CM

## 2022-12-08 NOTE — Therapy (Signed)
OUTPATIENT PHYSICAL THERAPY CERVICAL Treatment   Patient Name: [name] MRN: [id number] DOB:03-Aug-1950, 72 y.o., male Today's Date: 12/08/2022    END OF SESSION:  PT End of Session - 12/08/22 1142     Visit Number 10    Number of Visits 24    Date for PT Re-Evaluation 01/17/23    Authorization Type Medicare    Progress Note Due on Visit 19    PT Start Time 1144    PT Stop Time 1224    PT Time Calculation (min) 40 min    Activity Tolerance Patient tolerated treatment well    Behavior During Therapy [facility] for tasks assessed/performed                Past Medical History:  Diagnosis Date   Anemia    low iron   Arthritis    Coronary artery disease 2006   a. remote stenting to ramus, prox LAD x2.   Dental crowns present    Diabetes mellitus    First degree AV block    GERD (gastroesophageal reflux disease)    Heart attack (HCC) 06/2004   Heart murmur    aortic   Hx MRSA infection 02/2006   Hyperlipidemia    Hypertension    hx. of - has not been on med. since losing wt. after gastric bypass   Hypothyroidism    Morbid obesity (HCC)    Mucoid cyst of joint 09/2011   left thumb   Sleep apnea    sleep study 03/29/2011; no CPAP use, lost >130lbs   Trifascicular block    Past Surgical History:  Procedure Laterality Date   AMPUTATION Left 04/19/2016   Procedure: Left Foot 4th Toe Amputation vs. Transmetatarsal;  Surgeon: Nadara Mustard, MD;  Location: [facility] OR;  Service: Orthopedics;  Laterality: Left;   BIOPSY  04/07/2019   Procedure: BIOPSY;  Surgeon: Kathi Der, MD;  Location: [facility];  Service: Gastroenterology;;   CATARACT EXTRACTION  02/2010; 03/2010   COLONOSCOPY WITH PROPOFOL N/A 09/14/2014   Procedure: COLONOSCOPY WITH PROPOFOL;  Surgeon: Charolett Bumpers, MD;  Location: [facility];  Service: Endoscopy;  Laterality: N/A;   COLONOSCOPY WITH PROPOFOL N/A 04/07/2019   Procedure: COLONOSCOPY WITH PROPOFOL;  Surgeon: Kathi Der, MD;  Location: [facility]  ENDOSCOPY;  Service: Gastroenterology;  Laterality: N/A;   CORONARY ANGIOPLASTY WITH STENT PLACEMENT  07/12/2004; 08/03/2004   total of 3 stents   CORONARY STENT INTERVENTION N/A 08/23/2022   Procedure: CORONARY STENT INTERVENTION;  Surgeon: Swaziland, Peter M, MD;  Location: [facility] INVASIVE CV LAB;  Service: Cardiovascular;  Laterality: N/A;   I & D EXTREMITY Right 06/04/2014   Procedure: IRRIGATION AND DEBRIDEMENT EXTREMITY;  Surgeon: Kathryne Hitch, MD;  Location: [facility] OR;  Service: Orthopedics;  Laterality: Right;   LEFT HEART CATH AND CORONARY ANGIOGRAPHY N/A 08/23/2022   Procedure: LEFT HEART CATH AND CORONARY ANGIOGRAPHY;  Surgeon: Swaziland, Peter M, MD;  Location: [facility] INVASIVE CV LAB;  Service: Cardiovascular;  Laterality: N/A;   MASS EXCISION  10/04/2011   Procedure: EXCISION MASS;  Surgeon: Nicki Reaper, MD;  Location: [facility];  Service: Orthopedics;  Laterality: Left;  excision cyst debridment ip joint of left thumb   PROSTATECTOMY     right below knee amputation  2018   ROUX-EN-Y GASTRIC BYPASS  10/10/2010   laparoscopic   SHOULDER ARTHROSCOPY WITH ROTATOR CUFF REPAIR AND SUBACROMIAL DECOMPRESSION Right 03/23/2020   Procedure: RIGHT SHOULDER ARTHROSCOPY WITH DEBRIDEMENT AND  ROTATOR CUFF  REPAIR;  Surgeon: Kathryne Hitch, MD;  Location: [facility] OR;  Service: Orthopedics;  Laterality: Right;   TOE AMPUTATION  02/23/2006   left foot second ray amputation   TOTAL HIP ARTHROPLASTY Right 03/29/2020   Procedure: TOTAL HIP ARTHROPLASTY POSTERIOR APPROACH;  Surgeon: Tarry Kos, MD;  Location: [facility] OR;  Service: Orthopedics;  Laterality: Right;   TRANSMETATARSAL AMPUTATION Left    TRIGGER FINGER RELEASE Right 09/16/2019   Procedure: RELEASE TRIGGER FINGER/A-1 PULLEY;  Surgeon: Cindee Salt, MD;  Location: [facility];  Service: Orthopedics;  Laterality: Right;  IV REGIONAL FOREARM BLOCK   Patient Active Problem List   Diagnosis Date Noted   Paroxysmal atrial  fibrillation with RVR (HCC) 08/23/2022   Ascending aortic aneurysm (HCC) 08/23/2022   Bradycardia 08/22/2022   NSTEMI (non-ST elevated myocardial infarction) (HCC) 08/22/2022   Atrial fibrillation with RVR (HCC) 08/22/2022   Hypotension 01/07/2021   S/P arthroscopy of right shoulder 06/02/2020   Closed fracture of neck of right femur (HCC)    Hip fracture (HCC) 03/27/2020   Nontraumatic complete tear of right rotator cuff 02/26/2020   S/P transmetatarsal amputation of foot, left (HCC) 04/25/2016   Chronic osteomyelitis of toe, left (HCC)    Chronic diastolic CHF (congestive heart failure) (HCC) 09/25/2015   Aortic stenosis 08/04/2014   Diabetic foot infection (HCC) 06/03/2014   Peripheral vascular disease (HCC) 06/03/2014   Coronary artery disease involving native coronary artery of native heart without angina pectoris 07/29/2013   Trifascicular block 07/29/2013   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 07/26/2011   Hypothyroidism 01/22/2007   Type 2 diabetes mellitus with vascular disease (HCC) 01/22/2007   HYPERLIPIDEMIA, MIXED 01/22/2007   MYOCARDIAL INFARCTION, HX OF 01/22/2007   ANEMIA, IRON DEFICIENCY, HX OF 01/22/2007    PCP: Hillard Danker MD  REFERRING PROVIDER: Kirtland Bouchard, PA-C  REFERRING DIAG: M54.2 (ICD-10-CM) - Cervicalgia  THERAPY DIAG:  No diagnosis found.  Rationale for Evaluation and Treatment: Rehabilitation  ONSET DATE: 2.5-3 months  SUBJECTIVE:                                                                                                                                                                                                         SUBJECTIVE STATEMENT: Patient states feeling alright.     PERTINENT HISTORY:  Afib, hx NSTEMI, hx transmetatarsal amputation L, R BKA, CHF, PVD, HLD, Ascending aortic aneurysm, DM  PAIN:  Are you having pain? Yes: NPRS scale: 3 intermittent/10 Pain location: L cervical spine Pain description:  sharp Aggravating factors:  L rotation Relieving factors: rest  PRECAUTIONS: None  WEIGHT BEARING RESTRICTIONS: No  FALLS:  Has patient fallen in last 6 months? No  PLOF: Independent  PATIENT GOALS: turn to left and not have pain   OBJECTIVE: (objective measures from initial evaluation unless otherwise dated)  DIAGNOSTIC FINDINGS:  XR 09/14/22: Cervical spine 3 views: Shows normal lordotic curvature.  Disc base overall well-maintained.  No acute fractures no spondylolisthesis.  Minimal arthritic changes.   PATIENT SURVEYS:  FOTO 56% function  12/06/22: 54 % function  COGNITION: Overall cognitive status: Within functional limits for tasks assessed  SENSATION: WFL  POSTURE: rounded shoulders, forward head, and increased thoracic kyphosis  PALPATION: TTP bilateral UT, concordant on L, minimal to no tenderness throughout rest of cervical spine; hypomobile and lordotic c/sp  CERVICAL ROM:   Active ROM A/PROM (deg) eval AROM 11/2 Pre//post DN 12/06/22  Flexion 27  35  Extension 40  36  Right lateral flexion 30 * 24//30 21  Left lateral flexion 23 * 14*//30 13  Right rotation 58 70//70 79  Left rotation 59 * 60*//64 72   (Blank rows = not tested) *=pain/symptoms  UPPER EXTREMITY ROM: WFL for tasks assessed  Active ROM Right eval Left eval  Shoulder flexion    Shoulder extension    Shoulder abduction    Shoulder adduction    Shoulder extension    Shoulder internal rotation    Shoulder external rotation    Elbow flexion    Elbow extension    Wrist flexion    Wrist extension    Wrist ulnar deviation    Wrist radial deviation    Wrist pronation    Wrist supination     (Blank rows = not tested) *=pain/symptoms  UPPER EXTREMITY MMT:  MMT Right eval Left eval Right 12/06/22 Left 12/06/22  Shoulder flexion 4+ 4+ 4+ 4+  Shoulder extension      Shoulder abduction 4+ 4+ 4+ 4+  Shoulder adduction      Shoulder extension      Shoulder internal rotation       Shoulder external rotation      Middle trapezius      Lower trapezius      Elbow flexion 5 5    Elbow extension 5 5    Wrist flexion      Wrist extension      Wrist ulnar deviation      Wrist radial deviation      Wrist pronation      Wrist supination      Grip strength       (Blank rows = not tested) *=pain/symptoms  CERVICAL SPECIAL TESTS:  Distraction test: Negative    TODAY'S TREATMENT:                                                                                                                              12/08/22 TTP and hyperactive L UT Manual: STM to L  UT Seated L rotation SNAG 2 x 10 Seated extension SNAG 2 x 10 Standing bilateral shoulder ER BTB 2 x 10 Standing shoulder horizontal abduction BTB 2 x 10  Standing shoulder PNF D2 GTB 2 x 10   12/06/22 Reassessment TTP and hyperactive L UT Seated L rotation SNAG 2 x 10 Standing row BTB 3 x 10  12/01/22 Manual: STM to L UT Seated cervical retractions 2 x 10  Seated L rotation SNAG 2 x 10 Standing row BTB 3 x 10   11/29/22 Manual: STM to L UT pre and post dry needling for trigger point identification and muscular relaxation.; manual UT stretch  Trigger Point Dry-Needling  Treatment instructions: Expect mild to moderate muscle soreness. S/S of pneumothorax if dry needled over a lung field, and to seek immediate medical attention should they occur. Patient verbalized understanding of these instructions and education.  Patient Consent Given: Yes Education handout provided: Previously provided Muscles treated: L UT Electrical stimulation performed: No Parameters: N/A Treatment response/outcome: twitch response, decrease in tissue tension, decrease in symptoms.  Seated cervical retractions 1 x 10    Treatment                            11/2:  Trigger Point Dry Needling, Manual Therapy Treatment:  Initial or subsequent education regarding Trigger Point Dry Needling: Subsequent Did patient give consent to  treatment with Trigger Point Dry Needling: Yes TPDN with skilled palpation and monitoring followed by STM to the following muscles: bilat upper traps, bilat suboccipitials STM levator bilat, rib mobs Rt mid-thoracic  Review of scap/cervical retraction   11/22/22 TTP L UT   Manual: STM to L UT pre and post dry needling for trigger point identification and muscular relaxation.  Trigger Point Dry-Needling  Treatment instructions: Expect mild to moderate muscle soreness. S/S of pneumothorax if dry needled over a lung field, and to seek immediate medical attention should they occur. Patient verbalized understanding of these instructions and education.  Patient Consent Given: Yes Education handout provided: Previously provided Muscles treated: L UT Electrical stimulation performed: No Parameters: N/A Treatment response/outcome: twitch response, decrease in tissue tension, decrease in symptoms.  Seated UT stretch 3 x 30 second holds L Supine retraction; tuck+lift 10 x 5 second holds Seated thoracic extension over chair 1 x 10 Seated cervical retractions 1 x 10    10/24 Trigger Point Dry-Needling  Treatment instructions: Expect mild to moderate muscle soreness. S/S of pneumothorax if dry needled over a lung field, and to seek immediate medical attention should they occur. Patient verbalized understanding of these instructions and education.  Patient Consent Given: Yes Education handout provided: Yes Muscles treated: upper trap 3x using .30x50 needle cervical spine left .30x50 neede 2s pots right upper trap 2 spots  Electrical stimulation performed: No Parameters: N/A Treatment response/outcome: great twitch   Manual: reviewed use of thera-cane for self trigger point release. Skilled palpation of trigger points. Sub-occipital release. Gentle manual traction. Thoracic PA gross mobilization    Bilateral er 2x15 red Bilateral horizontal abduction 2x15 red  Bilateral flexion  2x15 red  band    Reviewed HEP and stretching   HOME EXERCISE PROGRAM: Access Code: [email or web address] URL: [email or web address]    ASSESSMENT:  CLINICAL IMPRESSION: Continued hyperactive and tender in UT. Decrease in symptoms and tissue tension with manual. Continued with resisted postural strengthening which is performed with good mechanics.  Patient will continue to benefit from skilled physical therapy in  order to improve function and reduce impairment.       OBJECTIVE IMPAIRMENTS: decreased activity tolerance, decreased endurance, decreased mobility, decreased ROM, decreased strength, hypomobility, increased muscle spasms, impaired flexibility, impaired UE functional use, improper body mechanics, postural dysfunction, and pain.   ACTIVITY LIMITATIONS: carrying, lifting, bending, reach over head, and caring for others  PARTICIPATION LIMITATIONS: meal prep, cleaning, laundry, driving, shopping, community activity, and yard work  PERSONAL FACTORS: 3+ comorbidities: Afib, hx NSTEMI, hx transmetatarsal, amputation L, CHF, PVD, HLD, Ascending aortic aneurysm, DM  are also affecting patient's functional outcome.   REHAB POTENTIAL: Good  CLINICAL DECISION MAKING: Stable/uncomplicated  EVALUATION COMPLEXITY: Low   GOALS: Goals reviewed with patient? Yes  SHORT TERM GOALS: Target date: 11/15/2022    Patient will be independent with HEP in order to improve functional outcomes. Baseline: Goal status: MET  2.  Patient will report at least 25% improvement in symptoms for improved quality of life. Baseline:  Goal status: MET    LONG TERM GOALS: Target date: 12/06/2022    Patient will report at least 75% improvement in symptoms for improved quality of life. Baseline:  Goal status: INITIAL  2.  Patient will improve FOTO score by at least 11 points in order to indicate improved tolerance to activity. Baseline: 56% function 12/06/22: 54% function Goal status: INITIAL  3.   Patient will demonstrate at least 25% improvement in cervical ROM in all restricted planes for improved ability to move head while driving. Baseline: see above Goal status: INITIAL  4.  Patient will be able to return to all activities unrestricted for improved ability to perform work functions and participate with family.  Baseline:  Goal status: INITIAL  5.  Patient will demonstrate grade of 5/5 MMT grade in all tested musculature as evidence of improved strength to assist with lifting at home. Baseline: see above Goal status: INITIAL      PLAN:  PT FREQUENCY: 1-2x/week  PT DURATION: 6 weeks  PLANNED INTERVENTIONS: Therapeutic exercises, Therapeutic activity, Neuromuscular re-education, Balance training, Gait training, Patient/Family education, Joint manipulation, Joint mobilization, Stair training, Orthotic/Fit training, DME instructions, Aquatic Therapy, Dry Needling, Electrical stimulation, Spinal manipulation, Spinal mobilization, Cryotherapy, Moist heat, Compression bandaging, scar mobilization, Splintting, Taping, Traction, Ultrasound, Ionotophoresis 4mg /ml Dexamethasone, and Manual therapy  PLAN FOR NEXT SESSION: f/u with HEP; possibly DN, postural strength, cervical mobility     Wyman Songster, PT 12/08/2022, 11:43 AM

## 2022-12-14 ENCOUNTER — Ambulatory Visit (INDEPENDENT_AMBULATORY_CARE_PROVIDER_SITE_OTHER): Payer: Medicare Other | Admitting: Orthopedic Surgery

## 2022-12-14 DIAGNOSIS — Z89432 Acquired absence of left foot: Secondary | ICD-10-CM

## 2022-12-14 DIAGNOSIS — L97521 Non-pressure chronic ulcer of other part of left foot limited to breakdown of skin: Secondary | ICD-10-CM | POA: Diagnosis not present

## 2022-12-15 ENCOUNTER — Encounter (HOSPITAL_BASED_OUTPATIENT_CLINIC_OR_DEPARTMENT_OTHER): Payer: Self-pay | Admitting: Physical Therapy

## 2022-12-15 ENCOUNTER — Ambulatory Visit (HOSPITAL_BASED_OUTPATIENT_CLINIC_OR_DEPARTMENT_OTHER): Payer: Medicare Other | Admitting: Physical Therapy

## 2022-12-15 DIAGNOSIS — R29898 Other symptoms and signs involving the musculoskeletal system: Secondary | ICD-10-CM | POA: Diagnosis not present

## 2022-12-15 DIAGNOSIS — M542 Cervicalgia: Secondary | ICD-10-CM | POA: Diagnosis not present

## 2022-12-15 NOTE — Therapy (Signed)
OUTPATIENT PHYSICAL THERAPY CERVICAL Treatment   Patient Name: [name] MRN: [id number] DOB:25-Apr-1950, 72 y.o., male Today's Date: 12/15/2022    END OF SESSION:  PT End of Session - 12/15/22 1444     Visit Number 11    Number of Visits 24    Date for PT Re-Evaluation 01/17/23    PT Start Time 1442    PT Stop Time 1515    PT Time Calculation (min) 33 min    Activity Tolerance Patient tolerated treatment well    Behavior During Therapy [facility] for tasks assessed/performed                Past Medical History:  Diagnosis Date   Anemia    low iron   Arthritis    Coronary artery disease 2006   a. remote stenting to ramus, prox LAD x2.   Dental crowns present    Diabetes mellitus    First degree AV block    GERD (gastroesophageal reflux disease)    Heart attack (HCC) 06/2004   Heart murmur    aortic   Hx MRSA infection 02/2006   Hyperlipidemia    Hypertension    hx. of - has not been on med. since losing wt. after gastric bypass   Hypothyroidism    Morbid obesity (HCC)    Mucoid cyst of joint 09/2011   left thumb   Sleep apnea    sleep study 03/29/2011; no CPAP use, lost >130lbs   Trifascicular block    Past Surgical History:  Procedure Laterality Date   AMPUTATION Left 04/19/2016   Procedure: Left Foot 4th Toe Amputation vs. Transmetatarsal;  Surgeon: Nadara Mustard, MD;  [facility]: [facility];  Service: Orthopedics;  Laterality: Left;   BIOPSY  04/07/2019   Procedure: BIOPSY;  Surgeon: Kathi Der, MD;  [facility]: [facility];  Service: Gastroenterology;;   CATARACT EXTRACTION  02/2010; 03/2010   COLONOSCOPY WITH PROPOFOL N/A 09/14/2014   Procedure: COLONOSCOPY WITH PROPOFOL;  Surgeon: Charolett Bumpers, MD;  [facility];  Service: Endoscopy;  Laterality: N/A;   COLONOSCOPY WITH PROPOFOL N/A 04/07/2019   Procedure: COLONOSCOPY WITH PROPOFOL;  Surgeon: Kathi Der, MD;  [facility];  Service: Gastroenterology;  Laterality: N/A;   CORONARY  ANGIOPLASTY WITH STENT PLACEMENT  07/12/2004; 08/03/2004   total of 3 stents   CORONARY STENT INTERVENTION N/A 08/23/2022   Procedure: CORONARY STENT INTERVENTION;  Surgeon: Swaziland, Peter M, MD;  [facility] INVASIVE CV LAB;  Service: Cardiovascular;  Laterality: N/A;   I & D EXTREMITY Right 06/04/2014   Procedure: IRRIGATION AND DEBRIDEMENT EXTREMITY;  Surgeon: Kathryne Hitch, MD;  Location: [facility] OR;  Service: Orthopedics;  Laterality: Right;   LEFT HEART CATH AND CORONARY ANGIOGRAPHY N/A 08/23/2022   Procedure: LEFT HEART CATH AND CORONARY ANGIOGRAPHY;  Surgeon: Swaziland, Peter M, MD;  Location: [facility] INVASIVE CV LAB;  Service: Cardiovascular;  Laterality: N/A;   MASS EXCISION  10/04/2011   Procedure: EXCISION MASS;  Surgeon: Nicki Reaper, MD;  Location: [facility];  Service: Orthopedics;  Laterality: Left;  excision cyst debridment ip joint of left thumb   PROSTATECTOMY     right below knee amputation  2018   ROUX-EN-Y GASTRIC BYPASS  10/10/2010   laparoscopic   SHOULDER ARTHROSCOPY WITH ROTATOR CUFF REPAIR AND SUBACROMIAL DECOMPRESSION Right 03/23/2020   Procedure: RIGHT SHOULDER ARTHROSCOPY WITH DEBRIDEMENT AND  ROTATOR CUFF REPAIR;  Surgeon: Kathryne Hitch, MD;  [facility];  Service: Orthopedics;  Laterality: Right;   TOE AMPUTATION  02/23/2006   left foot second ray amputation   TOTAL HIP ARTHROPLASTY Right 03/29/2020   Procedure: TOTAL HIP ARTHROPLASTY POSTERIOR APPROACH;  Surgeon: Tarry Kos, MD;  [facility];  Service: Orthopedics;  Laterality: Right;   TRANSMETATARSAL AMPUTATION Left    TRIGGER FINGER RELEASE Right 09/16/2019   Procedure: RELEASE TRIGGER FINGER/A-1 PULLEY;  Surgeon: Cindee Salt, MD;  Location: [facility];  Service: Orthopedics;  Laterality: Right;  IV REGIONAL FOREARM BLOCK   Patient Active Problem List   Diagnosis Date Noted   Paroxysmal atrial fibrillation with RVR (HCC) 08/23/2022   Ascending aortic aneurysm (HCC)  08/23/2022   Bradycardia 08/22/2022   NSTEMI (non-ST elevated myocardial infarction) (HCC) 08/22/2022   Atrial fibrillation with RVR (HCC) 08/22/2022   Hypotension 01/07/2021   S/P arthroscopy of right shoulder 06/02/2020   Closed fracture of neck of right femur (HCC)    Hip fracture (HCC) 03/27/2020   Nontraumatic complete tear of right rotator cuff 02/26/2020   S/P transmetatarsal amputation of foot, left (HCC) 04/25/2016   Chronic osteomyelitis of toe, left (HCC)    Chronic diastolic CHF (congestive heart failure) (HCC) 09/25/2015   Aortic stenosis 08/04/2014   Diabetic foot infection (HCC) 06/03/2014   Peripheral vascular disease (HCC) 06/03/2014   Coronary artery disease involving native coronary artery of native heart without angina pectoris 07/29/2013   Trifascicular block 07/29/2013   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 07/26/2011   Hypothyroidism 01/22/2007   Type 2 diabetes mellitus with vascular disease (HCC) 01/22/2007   HYPERLIPIDEMIA, MIXED 01/22/2007   MYOCARDIAL INFARCTION, HX OF 01/22/2007   ANEMIA, IRON DEFICIENCY, HX OF 01/22/2007    PCP: Hillard Danker MD  REFERRING PROVIDER: Kirtland Bouchard, PA-C  REFERRING DIAG: M54.2 (ICD-10-CM) - Cervicalgia  THERAPY DIAG:  Cervicalgia  Other symptoms and signs involving the musculoskeletal system  Rationale for Evaluation and Treatment: Rehabilitation  ONSET DATE: 2.5-3 months  SUBJECTIVE:                                                                                                                                                                                                         SUBJECTIVE STATEMENT: Patient reports the pain is more in his cervical spine today and less in his upper trap.  He reports overall has been feeling pretty good   PERTINENT HISTORY:  Afib, hx NSTEMI, hx transmetatarsal amputation L, R BKA, CHF, PVD, HLD, Ascending aortic aneurysm, DM  PAIN:  Are you having pain? Yes: NPRS scale:  3 intermittent/10 Pain location: L cervical spine Pain description: sharp Aggravating factors: L rotation Relieving factors: rest  PRECAUTIONS: None  WEIGHT BEARING RESTRICTIONS: No  FALLS:  Has patient fallen in last 6 months? No  PLOF: Independent  PATIENT GOALS: turn to left and not have pain   OBJECTIVE: (objective measures from initial evaluation unless otherwise dated)  DIAGNOSTIC FINDINGS:  XR 09/14/22: Cervical spine 3 views: Shows normal lordotic curvature.  Disc base overall well-maintained.  No acute fractures no spondylolisthesis.  Minimal arthritic changes.   PATIENT SURVEYS:  FOTO 56% function  12/06/22: 54 % function  COGNITION: Overall cognitive status: Within functional limits for tasks assessed  SENSATION: WFL  POSTURE: rounded shoulders, forward head, and increased thoracic kyphosis  PALPATION: TTP bilateral UT, concordant on L, minimal to no tenderness throughout rest of cervical spine; hypomobile and lordotic c/sp  CERVICAL ROM:   Active ROM A/PROM (deg) eval AROM 11/2 Pre//post DN 12/06/22  Flexion 27  35  Extension 40  36  Right lateral flexion 30 * 24//30 21  Left lateral flexion 23 * 14*//30 13  Right rotation 58 70//70 79  Left rotation 59 * 60*//64 72   (Blank rows = not tested) *=pain/symptoms  UPPER EXTREMITY ROM: WFL for tasks assessed  Active ROM Right eval Left eval  Shoulder flexion    Shoulder extension    Shoulder abduction    Shoulder adduction    Shoulder extension    Shoulder internal rotation    Shoulder external rotation    Elbow flexion    Elbow extension    Wrist flexion    Wrist extension    Wrist ulnar deviation    Wrist radial deviation    Wrist pronation    Wrist supination     (Blank rows = not tested) *=pain/symptoms  UPPER EXTREMITY MMT:  MMT Right eval Left eval Right 12/06/22 Left 12/06/22  Shoulder flexion 4+ 4+ 4+ 4+  Shoulder extension      Shoulder abduction 4+ 4+ 4+ 4+  Shoulder  adduction      Shoulder extension      Shoulder internal rotation      Shoulder external rotation      Middle trapezius      Lower trapezius      Elbow flexion 5 5    Elbow extension 5 5    Wrist flexion      Wrist extension      Wrist ulnar deviation      Wrist radial deviation      Wrist pronation      Wrist supination      Grip strength       (Blank rows = not tested) *=pain/symptoms  CERVICAL SPECIAL TESTS:  Distraction test: Negative    TODAY'S TREATMENT:  11/22  Manual: reviewed use of thera-cane for self trigger point release. Skilled palpation of trigger points. Sub-occipital release. Gentle manual traction. Thoracic PA gross mobilization      12/08/22 TTP and hyperactive L UT Manual: STM to L UT Seated L rotation SNAG 2 x 10 Seated extension SNAG 2 x 10 Standing bilateral shoulder ER BTB 2 x 10 Standing shoulder horizontal abduction BTB 2 x 10  Standing shoulder PNF D2 GTB 2 x 10   12/06/22 Reassessment TTP and hyperactive L UT Seated L rotation SNAG 2 x 10 Standing row BTB 3 x 10  12/01/22 Manual: STM to L UT Seated cervical retractions 2 x 10  Seated L rotation SNAG 2 x 10 Standing row BTB 3 x 10   11/29/22 Manual: STM to L UT pre and post dry needling for trigger point identification and muscular relaxation.; manual UT stretch  Trigger Point Dry-Needling  Treatment instructions: Expect mild to moderate muscle soreness. S/S of pneumothorax if dry needled over a lung field, and to seek immediate medical attention should they occur. Patient verbalized understanding of these instructions and education.  Patient Consent Given: Yes Education handout provided: Previously provided Muscles treated: L UT Electrical stimulation performed: No Parameters: N/A Treatment response/outcome: twitch response, decrease in tissue tension,  decrease in symptoms.  Seated cervical retractions 1 x 10    Treatment                            11/2:  Trigger Point Dry Needling, Manual Therapy Treatment:  Initial or subsequent education regarding Trigger Point Dry Needling: Subsequent Did patient give consent to treatment with Trigger Point Dry Needling: Yes TPDN with skilled palpation and monitoring followed by STM to the following muscles: bilat upper traps, bilat suboccipitials STM levator bilat, rib mobs Rt mid-thoracic  Review of scap/cervical retraction   11/22/22 TTP L UT   Manual: STM to L UT pre and post dry needling for trigger point identification and muscular relaxation.  Trigger Point Dry-Needling  Treatment instructions: Expect mild to moderate muscle soreness. S/S of pneumothorax if dry needled over a lung field, and to seek immediate medical attention should they occur. Patient verbalized understanding of these instructions and education.  Patient Consent Given: Yes Education handout provided: Previously provided Muscles treated: L UT Electrical stimulation performed: No Parameters: N/A Treatment response/outcome: twitch response, decrease in tissue tension, decrease in symptoms.  Seated UT stretch 3 x 30 second holds L Supine retraction; tuck+lift 10 x 5 second holds Seated thoracic extension over chair 1 x 10 Seated cervical retractions 1 x 10    10/24 Trigger Point Dry-Needling  Treatment instructions: Expect mild to moderate muscle soreness. S/S of pneumothorax if dry needled over a lung field, and to seek immediate medical attention should they occur. Patient verbalized understanding of these instructions and education.  Patient Consent Given: Yes Education handout provided: Yes Muscles treated: upper trap 3x using .30x50 needle cervical spine left .30x50 neede 2s pots right upper trap 2 spots  Electrical stimulation performed: No Parameters: N/A Treatment response/outcome: great twitch    Manual: reviewed use of thera-cane for self trigger point release. Skilled palpation of trigger points. Sub-occipital release. Gentle manual traction. Thoracic PA gross mobilization    Bilateral er 2x15 red Bilateral horizontal abduction 2x15 red  Bilateral flexion  2x15 red band    Reviewed HEP and stretching   HOME EXERCISE PROGRAM: Access Code: [email or web address] URL: [email or web address]  ASSESSMENT:  CLINICAL IMPRESSION: Patient tolerated treatment well today.  He continues to have spasming in his left upper trap and left cervical paraspinals.  Muscle tension decreased with manual therapy.  He had nearly full range of motion following treatment.  We somewhat limited by time today.  He was advised to work on his exercises on his own at home.  Therapy will continue to progress as tolerated.    OBJECTIVE IMPAIRMENTS: decreased activity tolerance, decreased endurance, decreased mobility, decreased ROM, decreased strength, hypomobility, increased muscle spasms, impaired flexibility, impaired UE functional use, improper body mechanics, postural dysfunction, and pain.   ACTIVITY LIMITATIONS: carrying, lifting, bending, reach over head, and caring for others  PARTICIPATION LIMITATIONS: meal prep, cleaning, laundry, driving, shopping, community activity, and yard work  PERSONAL FACTORS: 3+ comorbidities: Afib, hx NSTEMI, hx transmetatarsal, amputation L, CHF, PVD, HLD, Ascending aortic aneurysm, DM  are also affecting patient's functional outcome.   REHAB POTENTIAL: Good  CLINICAL DECISION MAKING: Stable/uncomplicated  EVALUATION COMPLEXITY: Low   GOALS: Goals reviewed with patient? Yes  SHORT TERM GOALS: Target date: 11/15/2022    Patient will be independent with HEP in order to improve functional outcomes. Baseline: Goal status: MET  2.  Patient will report at least 25% improvement in symptoms for improved quality of life. Baseline:  Goal status:  MET    LONG TERM GOALS: Target date: 12/06/2022    Patient will report at least 75% improvement in symptoms for improved quality of life. Baseline:  Goal status: INITIAL  2.  Patient will improve FOTO score by at least 11 points in order to indicate improved tolerance to activity. Baseline: 56% function 12/06/22: 54% function Goal status: INITIAL  3.  Patient will demonstrate at least 25% improvement in cervical ROM in all restricted planes for improved ability to move head while driving. Baseline: see above Goal status: INITIAL  4.  Patient will be able to return to all activities unrestricted for improved ability to perform work functions and participate with family.  Baseline:  Goal status: INITIAL  5.  Patient will demonstrate grade of 5/5 MMT grade in all tested musculature as evidence of improved strength to assist with lifting at home. Baseline: see above Goal status: INITIAL      PLAN:  PT FREQUENCY: 1-2x/week  PT DURATION: 6 weeks  PLANNED INTERVENTIONS: Therapeutic exercises, Therapeutic activity, Neuromuscular re-education, Balance training, Gait training, Patient/Family education, Joint manipulation, Joint mobilization, Stair training, Orthotic/Fit training, DME instructions, Aquatic Therapy, Dry Needling, Electrical stimulation, Spinal manipulation, Spinal mobilization, Cryotherapy, Moist heat, Compression bandaging, scar mobilization, Splintting, Taping, Traction, Ultrasound, Ionotophoresis 4mg /ml Dexamethasone, and Manual therapy  PLAN FOR NEXT SESSION: f/u with HEP; possibly DN, postural strength, cervical mobility     Dessie Coma, PT 12/15/2022, 2:46 PM

## 2022-12-25 ENCOUNTER — Encounter: Payer: Self-pay | Admitting: Orthopedic Surgery

## 2022-12-25 DIAGNOSIS — Z8546 Personal history of malignant neoplasm of prostate: Secondary | ICD-10-CM | POA: Diagnosis not present

## 2022-12-25 DIAGNOSIS — N5201 Erectile dysfunction due to arterial insufficiency: Secondary | ICD-10-CM | POA: Diagnosis not present

## 2022-12-25 NOTE — Progress Notes (Signed)
Office Visit Note   Patient: [name]           Date of Birth: [birth year]           MRN: [id number] Visit Date: 12/14/2022              Requested by: Thana Ates, MD [address] [place],  [place] [address] PCP: Thana Ates, MD  Chief Complaint  Patient presents with   Left Foot - Wound Check      HPI: Patient is a 72 year old gentleman who is seen in follow-up for left transmetatarsal amputation.  Patient has a modified orthotic and a modified pressure offloading.  Assessment & Plan: Visit Diagnoses:  1. History of transmetatarsal amputation of left foot (HCC)   2. Non-pressure chronic ulcer of other part of left foot limited to breakdown of skin (HCC)     Plan: Ulcer was debrided.  Continue with the new orthotics and shoe wear.  Follow-Up Instructions: Return in about 4 weeks (around 01/11/2023).   Ortho Exam  Patient is alert, oriented, no adenopathy, well-dressed, normal affect, normal respiratory effort. Examination patient has a ulcer beneath the second metatarsal status post transmetatarsal amputation.  There is no ascending cellulitis no purulent drainage.  After informed consent a 10 blade knife was used to debride the skin and soft tissue back to healthy viable tissue.  The ulcer was 1 cm in diameter and 2 mm deep after debridement.  Imaging: No results found.   Labs: Lab Results  Component Value Date   HGBA1C 6.7 (H) 08/24/2022   HGBA1C 6.8 (H) 03/28/2020   HGBA1C 6.5 (H) 09/26/2015   REPTSTATUS 08/01/2016 FINAL 07/30/2016   GRAMSTAIN  06/04/2014    MODERATE WBC PRESENT, PREDOMINANTLY PMN NO SQUAMOUS EPITHELIAL CELLS SEEN MODERATE GRAM POSITIVE COCCI IN CLUSTERS Performed at [facility]    GRAMSTAIN  06/04/2014    MODERATE WBC PRESENT, PREDOMINANTLY PMN NO SQUAMOUS EPITHELIAL CELLS SEEN MODERATE GRAM POSITIVE COCCI IN PAIRS Performed at [facility]    CULT  07/30/2016    NO GROWTH Performed at [facility], [address] [place], [place] [address]      Lab Results  Component Value Date   ALBUMIN 3.2 (L) 08/24/2022   ALBUMIN 3.4 (L) 08/23/2022   ALBUMIN 4.1 08/22/2022    Lab Results  Component Value Date   MG 1.8 08/22/2022   MG 1.8 08/08/2021   MG 2.0 04/02/2020   Lab Results  Component Value Date   VD25OH 53.03 03/28/2020    No results found for: "PREALBUMIN"    Latest Ref Rng & Units 08/24/2022   12:58 AM 08/23/2022    3:49 AM 08/22/2022    3:57 PM  CBC EXTENDED  WBC 4.0 - 10.5 K/uL 6.4  5.3  9.5   RBC 4.22 - 5.81 MIL/uL 4.15  4.05  4.85   Hemoglobin 13.0 - 17.0 g/dL 86.5  78.4  69.6   HCT 39.0 - 52.0 % 37.5  38.0  43.8   Platelets 150 - 400 K/uL 187  163  236      There is no height or weight on file to calculate BMI.  Orders:  No orders of the defined types were placed in this encounter.  No orders of the defined types were placed in this encounter.    Procedures: No procedures performed  Clinical Data: No additional findings.  ROS:  All other systems negative, except as  noted in the HPI. Review of Systems  Objective: Vital Signs: There were no vitals taken for this visit.  Specialty Comments:  No specialty comments available.  PMFS History: Patient Active Problem List   Diagnosis Date Noted   Paroxysmal atrial fibrillation with RVR (HCC) 08/23/2022   Ascending aortic aneurysm (HCC) 08/23/2022   Bradycardia 08/22/2022   NSTEMI (non-ST elevated myocardial infarction) (HCC) 08/22/2022   Atrial fibrillation with RVR (HCC) 08/22/2022   Hypotension 01/07/2021   S/P arthroscopy of right shoulder 06/02/2020   Closed fracture of neck of right femur (HCC)    Hip fracture (HCC) 03/27/2020   Nontraumatic complete tear of right rotator cuff 02/26/2020   S/P transmetatarsal amputation of foot, left (HCC) 04/25/2016   Chronic osteomyelitis of toe, left (HCC)    Chronic diastolic CHF (congestive heart failure) (HCC) 09/25/2015   Aortic stenosis  08/04/2014   Diabetic foot infection (HCC) 06/03/2014   Peripheral vascular disease (HCC) 06/03/2014   Coronary artery disease involving native coronary artery of native heart without angina pectoris 07/29/2013   Trifascicular block 07/29/2013   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 remote stenting to ramus, prox LAD x2.   Dental crowns present    Diabetes mellitus    First degree AV block    GERD (gastroesophageal reflux disease)    Heart attack (HCC) 06/2004   Heart murmur    aortic   Hx MRSA infection 02/2006   Hyperlipidemia    Hypertension    hx. of - has not been on med. since losing wt. after gastric bypass   Hypothyroidism    Morbid obesity (HCC)    Mucoid cyst of joint 09/2011   left thumb   Sleep apnea    sleep study 03/29/2011; no CPAP use, lost >130lbs   Trifascicular block     Family History  Problem Relation Age of Onset   Heart disease Mother     Past Surgical History:  Procedure Laterality Date   AMPUTATION Left 04/19/2016   Procedure: Left Foot 4th Toe Amputation vs. Transmetatarsal;  Surgeon: Nadara Mustard, MD;  [facility];  Service: Orthopedics;  Laterality: Left;   BIOPSY  04/07/2019   Procedure: BIOPSY;  Surgeon: Kathi Der, MD;  [facility];  Service: Gastroenterology;;   CATARACT EXTRACTION  02/2010; 03/2010   COLONOSCOPY WITH PROPOFOL N/A 09/14/2014   Procedure: COLONOSCOPY WITH PROPOFOL;  Surgeon: Charolett Bumpers, MD;  [facility];  Service: Endoscopy;  Laterality: N/A;   COLONOSCOPY WITH PROPOFOL N/A 04/07/2019   Procedure: COLONOSCOPY WITH PROPOFOL;  Surgeon: Kathi Der, MD;   [facility];  Service: Gastroenterology;  Laterality: N/A;   CORONARY ANGIOPLASTY WITH STENT PLACEMENT  07/12/2004; 08/03/2004   total of 3 stents   CORONARY STENT INTERVENTION N/A 08/23/2022   Procedure: CORONARY STENT INTERVENTION;  Surgeon: Swaziland, Peter M, MD;  Location: [facility] INVASIVE CV LAB;  Service: Cardiovascular;  Laterality: N/A;   I & D EXTREMITY Right 06/04/2014   Procedure: IRRIGATION AND DEBRIDEMENT EXTREMITY;  Surgeon: Kathryne Hitch, MD;  Location: [facility] OR;  Service: Orthopedics;  Laterality: Right;  LEFT HEART CATH AND CORONARY ANGIOGRAPHY N/A 08/23/2022   Procedure: LEFT HEART CATH AND CORONARY ANGIOGRAPHY;  Surgeon: Swaziland, Peter M, MD;  Location: [facility] INVASIVE CV LAB;  Service: Cardiovascular;  Laterality: N/A;   MASS EXCISION  10/04/2011   Procedure: EXCISION MASS;  Surgeon: Nicki Reaper, MD;  Location: [facility];  Service: Orthopedics;  Laterality: Left;  excision cyst debridment ip joint of left thumb   PROSTATECTOMY     right below knee amputation  2018   ROUX-EN-Y GASTRIC BYPASS  10/10/2010   laparoscopic   SHOULDER ARTHROSCOPY WITH ROTATOR CUFF REPAIR AND SUBACROMIAL DECOMPRESSION Right 03/23/2020   Procedure: RIGHT SHOULDER ARTHROSCOPY WITH DEBRIDEMENT AND  ROTATOR CUFF REPAIR;  Surgeon: Kathryne Hitch, MD;  [facility];  Service: Orthopedics;  Laterality: Right;   TOE AMPUTATION  02/23/2006   left foot second ray amputation   TOTAL HIP ARTHROPLASTY Right 03/29/2020   Procedure: TOTAL HIP ARTHROPLASTY POSTERIOR APPROACH;  Surgeon: Tarry Kos, MD;  Location: [facility];  Service: Orthopedics;  Laterality: Right;   TRANSMETATARSAL AMPUTATION Left    TRIGGER FINGER RELEASE Right 09/16/2019   Procedure: RELEASE TRIGGER FINGER/A-1 PULLEY;  Surgeon: Cindee Salt, MD;  Location: [facility];  Service: Orthopedics;  Laterality: Right;  IV REGIONAL FOREARM BLOCK   Social History   Occupational History   Occupation: IT trainer taxes   Tobacco Use   Smoking status: Never   Smokeless tobacco: Never  Substance and Sexual Activity   Alcohol use: No   Drug use: No   Sexual activity: Yes

## 2022-12-27 ENCOUNTER — Encounter (HOSPITAL_BASED_OUTPATIENT_CLINIC_OR_DEPARTMENT_OTHER): Payer: Self-pay

## 2022-12-27 ENCOUNTER — Ambulatory Visit (HOSPITAL_BASED_OUTPATIENT_CLINIC_OR_DEPARTMENT_OTHER): Payer: Medicare Other | Admitting: Physical Therapy

## 2022-12-28 ENCOUNTER — Ambulatory Visit: Payer: Medicare Other | Attending: Cardiology | Admitting: Cardiology

## 2022-12-28 ENCOUNTER — Encounter: Payer: Self-pay | Admitting: Cardiology

## 2022-12-28 VITALS — BP 142/86 | HR 63 | Ht >= 80 in | Wt 219.2 lb

## 2022-12-28 DIAGNOSIS — I7121 Aneurysm of the ascending aorta, without rupture: Secondary | ICD-10-CM | POA: Insufficient documentation

## 2022-12-28 DIAGNOSIS — I480 Paroxysmal atrial fibrillation: Secondary | ICD-10-CM | POA: Insufficient documentation

## 2022-12-28 DIAGNOSIS — I2510 Atherosclerotic heart disease of native coronary artery without angina pectoris: Secondary | ICD-10-CM | POA: Diagnosis not present

## 2022-12-28 NOTE — Patient Instructions (Signed)
Medication Instructions:  Your physician recommends that you continue on your current medications as directed. Please refer to the Current Medication list given to you today.  *If you need a refill on your cardiac medications before your next appointment, please call your pharmacy*  Lab Work: None ordered today.  Testing/Procedures: None ordered today.  Follow-Up: At [facility], you and your health needs are our priority.  As part of our continuing mission to provide you with exceptional heart care, we have created designated Provider Care Teams.  These Care Teams include your primary Cardiologist (physician) and Advanced Practice Providers (APPs -  Physician Assistants and Nurse Practitioners) who all work together to provide you with the care you need, when you need it.  Your next appointment:   6 month(s)  The format for your next appointment:   In Person  Provider:   Robin Searing, NP     Then, Donato Schultz, MD will plan to see you again in 1 year(s).{

## 2022-12-28 NOTE — Progress Notes (Signed)
Cardiology Office Note:  .   Date:  12/28/2022  ID:  [name], DOB 1950/05/13, MRN [id number] PCP: Thana Ates, MD  [facility] Providers Cardiologist:  Donato Schultz, MD     History of Present Illness: Marland Kitchen   [name] is a 72 y.o. male Discussed with the use of AI scribe software   History of Present Illness   The patient, a 72 year old with a history of coronary artery disease, atrial fibrillation, and a recent myocardial infarction, presents for a follow-up visit. He had a stent placed in the proximal LAD, but a stent could not be placed in the RCA. The patient has been doing well since the procedure, with no reported fainting or anginal symptoms. He is currently on Eliquis 5mg  twice a day.  The patient also has a prosthetic leg and recently experienced an issue with the socket, which was resolved. He reports no appetite, a problem he has discussed with his primary care physician. Despite this, he has maintained a stable weight.  The patient also reports occasional low blood sugar episodes, even on days when he has eaten. He is on metformin and has been monitoring his blood sugar levels.  The patient has a benign polyp at the junction of the large and small intestine.  The patient's recent labs show a hemoglobin of 13.6 and a creatinine of 0.8. His LDL is 55, and his pump function is normal at 65%. The patient's ascending aorta is mildly dilated at 44 millimeters.          Studies Reviewed: .        Results   LABS Hemoglobin: 13.6 g/dL (82/95/6213) Creatinine: 0.8 mg/dL (08/65/7846) LDL: 55 mg/dL (96/29/5284)  RADIOLOGY CT scan of chest and aorta: 5 cm stable ascending aorta (08/22/2022)  DIAGNOSTIC Echocardiogram: Ejection fraction 65%, mitral valve normal, aortic valve mild calcification without stenosis, ascending aorta mildly dilated (44 mm) (07/2022)     Risk Assessment/Calculations:           Physical Exam:   VS:  BP (!) 142/86   Pulse 63   Ht 6\' 8"   (2.032 m)   Wt 219 lb 3.2 oz (99.4 kg)   SpO2 99%   BMI 24.08 kg/m    Wt Readings from Last 3 Encounters:  12/28/22 219 lb 3.2 oz (99.4 kg)  10/11/22 218 lb 6.4 oz (99.1 kg)  09/08/22 220 lb 12.8 oz (100.2 kg)    GEN: Well nourished, well developed in no acute distress NECK: No JVD; No carotid bruits CARDIAC: RRR, no murmurs, no rubs, no gallops RESPIRATORY:  Clear to auscultation without rales, wheezing or rhonchi  ABDOMEN: Soft, non-tender, non-distended EXTREMITIES:  No edema; No deformity   ASSESSMENT AND PLAN: .    Assessment and Plan    Coronary Artery Disease (CAD) Follow-up for CAD post-myocardial infarction with proximal LAD stent placement. Unable to place stent in RCA. No anginal symptoms. LDL controlled at 55. Echocardiogram shows normal ejection fraction (65%), mild aortic valve calcification without stenosis, and mildly dilated ascending aorta (44 mm). Emphasized medication adherence and lifestyle modifications to reduce myocardial infarction risk. - Continue Eliquis 5 mg BID and Plavix - Schedule follow-up echocardiogram in six months  Paroxysmal Atrial Fibrillation Paroxysmal atrial fibrillation, previously converted with IV low presser but developed junctional rhythm. Evaluated by electrophysiology with new indication for pacemaker. Currently managed on Eliquis. Discussed potential future pacemaker placement to improve quality of life and reduce AFib complications. - Continue Eliquis 5  mg BID - Monitor for AFib symptoms - Consider pacemaker placement if indicated  Colon Polyp Large benign polyp at the junction of the large and small intestine. Plan for surgical removal in 9-12 months, pending blood thinner clearance. Discussed risks and benefits of delaying surgery, including low malignancy risk and importance of being off blood thinners to reduce surgical bleeding risk. - Plan for surgery post-July 2025 - Coordinate with surgeon for blood thinner discontinuation  timing  Appetite Loss Significant appetite loss, potentially related to gabapentin or metformin. Thyroid function tests normal. No clear etiology identified. Discussed dietary modifications and strategies to improve appetite. Emphasized maintaining nutritional intake to prevent weight loss and malnutrition. - Discuss dietary modifications and appetite improvement strategies - Monitor blood sugar levels closely - Consider nutritionist referral if symptoms persist  General Health Maintenance Discussed importance of wearing protective gloves to prevent bleeding due to blood thinners. Emphasized regular monitoring of blood pressure, cholesterol, and blood sugar levels to manage cardiovascular health. - Advise wearing thin gloves during activities to prevent injuries and bleeding - Continue regular monitoring of blood pressure, cholesterol, and blood sugar levels  Follow-up - Schedule follow-up appointment in six months with Alden Server - Consider rescheduling annual physical to later in the year.               Signed, Donato Schultz, MD

## 2022-12-29 ENCOUNTER — Ambulatory Visit (HOSPITAL_BASED_OUTPATIENT_CLINIC_OR_DEPARTMENT_OTHER): Payer: Medicare Other | Attending: Physician Assistant | Admitting: Physical Therapy

## 2022-12-29 ENCOUNTER — Encounter (HOSPITAL_BASED_OUTPATIENT_CLINIC_OR_DEPARTMENT_OTHER): Payer: Self-pay | Admitting: Physical Therapy

## 2022-12-29 DIAGNOSIS — M542 Cervicalgia: Secondary | ICD-10-CM | POA: Diagnosis not present

## 2022-12-29 DIAGNOSIS — R29898 Other symptoms and signs involving the musculoskeletal system: Secondary | ICD-10-CM | POA: Insufficient documentation

## 2022-12-29 NOTE — Therapy (Signed)
OUTPATIENT PHYSICAL THERAPY CERVICAL Treatment   Patient Name: [name] MRN: [id number] DOB:30-Aug-1950, 72 y.o., male Today's Date: 12/29/2022    END OF SESSION:  PT End of Session - 12/29/22 1519     Visit Number 12    Number of Visits 24    Date for PT Re-Evaluation 01/17/23    Authorization Type Medicare    Progress Note Due on Visit 19    PT Start Time 1519    PT Stop Time 1559    PT Time Calculation (min) 40 min    Activity Tolerance Patient tolerated treatment well    Behavior During Therapy [facility] for tasks assessed/performed                Past Medical History:  Diagnosis Date   Anemia    low iron   Arthritis    Coronary artery disease 2006   a. remote stenting to ramus, prox LAD x2.   Dental crowns present    Diabetes mellitus    First degree AV block    GERD (gastroesophageal reflux disease)    Heart attack (HCC) 06/2004   Heart murmur    aortic   Hx MRSA infection 02/2006   Hyperlipidemia    Hypertension    hx. of - has not been on med. since losing wt. after gastric bypass   Hypothyroidism    Morbid obesity (HCC)    Mucoid cyst of joint 09/2011   left thumb   Sleep apnea    sleep study 03/29/2011; no CPAP use, lost >130lbs   Trifascicular block    Past Surgical History:  Procedure Laterality Date   AMPUTATION Left 04/19/2016   Procedure: Left Foot 4th Toe Amputation vs. Transmetatarsal;  Surgeon: Nadara Mustard, MD;  Location: [facility] OR;  Service: Orthopedics;  Laterality: Left;   BIOPSY  04/07/2019   Procedure: BIOPSY;  Surgeon: Kathi Der, MD;  Location: [facility] ENDOSCOPY;  Service: Gastroenterology;;   CATARACT EXTRACTION  02/2010; 03/2010   COLONOSCOPY WITH PROPOFOL N/A 09/14/2014   Procedure: COLONOSCOPY WITH PROPOFOL;  Surgeon: Charolett Bumpers, MD;  Location: [facility] ENDOSCOPY;  Service: Endoscopy;  Laterality: N/A;   COLONOSCOPY WITH PROPOFOL N/A 04/07/2019   Procedure: COLONOSCOPY WITH PROPOFOL;  Surgeon: Kathi Der, MD;  Location: [facility]  ENDOSCOPY;  Service: Gastroenterology;  Laterality: N/A;   CORONARY ANGIOPLASTY WITH STENT PLACEMENT  07/12/2004; 08/03/2004   total of 3 stents   CORONARY STENT INTERVENTION N/A 08/23/2022   Procedure: CORONARY STENT INTERVENTION;  Surgeon: Swaziland, Peter M, MD;  Location: [facility] INVASIVE CV LAB;  Service: Cardiovascular;  Laterality: N/A;   I & D EXTREMITY Right 06/04/2014   Procedure: IRRIGATION AND DEBRIDEMENT EXTREMITY;  Surgeon: Kathryne Hitch, MD;  Location: [facility] OR;  Service: Orthopedics;  Laterality: Right;   LEFT HEART CATH AND CORONARY ANGIOGRAPHY N/A 08/23/2022   Procedure: LEFT HEART CATH AND CORONARY ANGIOGRAPHY;  Surgeon: Swaziland, Peter M, MD;  Location: [facility] INVASIVE CV LAB;  Service: Cardiovascular;  Laterality: N/A;   MASS EXCISION  10/04/2011   Procedure: EXCISION MASS;  Surgeon: Nicki Reaper, MD;  Location: [facility];  Service: Orthopedics;  Laterality: Left;  excision cyst debridment ip joint of left thumb   PROSTATECTOMY     right below knee amputation  2018   ROUX-EN-Y GASTRIC BYPASS  10/10/2010   laparoscopic   SHOULDER ARTHROSCOPY WITH ROTATOR CUFF REPAIR AND SUBACROMIAL DECOMPRESSION Right 03/23/2020   Procedure: RIGHT SHOULDER ARTHROSCOPY WITH DEBRIDEMENT AND  ROTATOR CUFF  REPAIR;  Surgeon: Kathryne Hitch, MD;  Location: [facility] OR;  Service: Orthopedics;  Laterality: Right;   TOE AMPUTATION  02/23/2006   left foot second ray amputation   TOTAL HIP ARTHROPLASTY Right 03/29/2020   Procedure: TOTAL HIP ARTHROPLASTY POSTERIOR APPROACH;  Surgeon: Tarry Kos, MD;  Location: [facility] OR;  Service: Orthopedics;  Laterality: Right;   TRANSMETATARSAL AMPUTATION Left    TRIGGER FINGER RELEASE Right 09/16/2019   Procedure: RELEASE TRIGGER FINGER/A-1 PULLEY;  Surgeon: Cindee Salt, MD;  Location: [facility];  Service: Orthopedics;  Laterality: Right;  IV REGIONAL FOREARM BLOCK   Patient Active Problem List   Diagnosis Date Noted   Paroxysmal atrial  fibrillation with RVR (HCC) 08/23/2022   Ascending aortic aneurysm (HCC) 08/23/2022   Bradycardia 08/22/2022   NSTEMI (non-ST elevated myocardial infarction) (HCC) 08/22/2022   Atrial fibrillation with RVR (HCC) 08/22/2022   Hypotension 01/07/2021   S/P arthroscopy of right shoulder 06/02/2020   Closed fracture of neck of right femur (HCC)    Hip fracture (HCC) 03/27/2020   Nontraumatic complete tear of right rotator cuff 02/26/2020   S/P transmetatarsal amputation of foot, left (HCC) 04/25/2016   Chronic osteomyelitis of toe, left (HCC)    Chronic diastolic CHF (congestive heart failure) (HCC) 09/25/2015   Aortic stenosis 08/04/2014   Diabetic foot infection (HCC) 06/03/2014   Peripheral vascular disease (HCC) 06/03/2014   Coronary artery disease involving native coronary artery of native heart without angina pectoris 07/29/2013   Trifascicular block 07/29/2013   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 07/26/2011   Hypothyroidism 01/22/2007   Type 2 diabetes mellitus with vascular disease (HCC) 01/22/2007   HYPERLIPIDEMIA, MIXED 01/22/2007   MYOCARDIAL INFARCTION, HX OF 01/22/2007   ANEMIA, IRON DEFICIENCY, HX OF 01/22/2007    PCP: Hillard Danker MD  REFERRING PROVIDER: Kirtland Bouchard, PA-C  REFERRING DIAG: M54.2 (ICD-10-CM) - Cervicalgia  THERAPY DIAG:  Cervicalgia  Other symptoms and signs involving the musculoskeletal system  Rationale for Evaluation and Treatment: Rehabilitation  ONSET DATE: 2.5-3 months  SUBJECTIVE:                                                                                                                                                                                                         SUBJECTIVE STATEMENT: Patient reports some L low back pain. Had to get prosthetic adjusted. Still getting a twinge of pain in his neck with left rotation from time to time. Pain is not as severe in the neck as it was. Pain intermittent with L  rotation at end  range.    PERTINENT HISTORY:  Afib, hx NSTEMI, hx transmetatarsal amputation L, R BKA, CHF, PVD, HLD, Ascending aortic aneurysm, DM  PAIN:  Are you having pain? Yes: NPRS scale: 3 intermittent/10 Pain location: L cervical spine Pain description: sharp Aggravating factors: L rotation Relieving factors: rest  PRECAUTIONS: None  WEIGHT BEARING RESTRICTIONS: No  FALLS:  Has patient fallen in last 6 months? No  PLOF: Independent  PATIENT GOALS: turn to left and not have pain   OBJECTIVE: (objective measures from initial evaluation unless otherwise dated)  DIAGNOSTIC FINDINGS:  XR 09/14/22: Cervical spine 3 views: Shows normal lordotic curvature.  Disc base overall well-maintained.  No acute fractures no spondylolisthesis.  Minimal arthritic changes.   PATIENT SURVEYS:  FOTO 56% function  12/06/22: 54 % function  COGNITION: Overall cognitive status: Within functional limits for tasks assessed  SENSATION: WFL  POSTURE: rounded shoulders, forward head, and increased thoracic kyphosis  PALPATION: TTP bilateral UT, concordant on L, minimal to no tenderness throughout rest of cervical spine; hypomobile and lordotic c/sp  CERVICAL ROM:   Active ROM A/PROM (deg) eval AROM 11/2 Pre//post DN 12/06/22  Flexion 27  35  Extension 40  36  Right lateral flexion 30 * 24//30 21  Left lateral flexion 23 * 14*//30 13  Right rotation 58 70//70 79  Left rotation 59 * 60*//64 72   (Blank rows = not tested) *=pain/symptoms  UPPER EXTREMITY ROM: WFL for tasks assessed  Active ROM Right eval Left eval  Shoulder flexion    Shoulder extension    Shoulder abduction    Shoulder adduction    Shoulder extension    Shoulder internal rotation    Shoulder external rotation    Elbow flexion    Elbow extension    Wrist flexion    Wrist extension    Wrist ulnar deviation    Wrist radial deviation    Wrist pronation    Wrist supination     (Blank rows = not tested)  *=pain/symptoms  UPPER EXTREMITY MMT:  MMT Right eval Left eval Right 12/06/22 Left 12/06/22  Shoulder flexion 4+ 4+ 4+ 4+  Shoulder extension      Shoulder abduction 4+ 4+ 4+ 4+  Shoulder adduction      Shoulder extension      Shoulder internal rotation      Shoulder external rotation      Middle trapezius      Lower trapezius      Elbow flexion 5 5    Elbow extension 5 5    Wrist flexion      Wrist extension      Wrist ulnar deviation      Wrist radial deviation      Wrist pronation      Wrist supination      Grip strength       (Blank rows = not tested) *=pain/symptoms  CERVICAL SPECIAL TESTS:  Distraction test: Negative    TODAY'S TREATMENT:  12/29/22 Manual: STM to L lumbar paraspinals and L glutes  Open book 10 x 5 second holds bilateral  Standing hip abduction 2 x 10  11/22  Manual: reviewed use of thera-cane for self trigger point release. Skilled palpation of trigger points. Sub-occipital release. Gentle manual traction. Thoracic PA gross mobilization      12/08/22 TTP and hyperactive L UT Manual: STM to L UT Seated L rotation SNAG 2 x 10 Seated extension SNAG 2 x 10 Standing bilateral shoulder ER BTB 2 x 10 Standing shoulder horizontal abduction BTB 2 x 10  Standing shoulder PNF D2 GTB 2 x 10   12/06/22 Reassessment TTP and hyperactive L UT Seated L rotation SNAG 2 x 10 Standing row BTB 3 x 10  12/01/22 Manual: STM to L UT Seated cervical retractions 2 x 10  Seated L rotation SNAG 2 x 10 Standing row BTB 3 x 10   11/29/22 Manual: STM to L UT pre and post dry needling for trigger point identification and muscular relaxation.; manual UT stretch  Trigger Point Dry-Needling  Treatment instructions: Expect mild to moderate muscle soreness. S/S of pneumothorax if dry needled over a lung field, and to seek immediate  medical attention should they occur. Patient verbalized understanding of these instructions and education.  Patient Consent Given: Yes Education handout provided: Previously provided Muscles treated: L UT Electrical stimulation performed: No Parameters: N/A Treatment response/outcome: twitch response, decrease in tissue tension, decrease in symptoms.  Seated cervical retractions 1 x 10    Treatment                            11/2:  Trigger Point Dry Needling, Manual Therapy Treatment:  Initial or subsequent education regarding Trigger Point Dry Needling: Subsequent Did patient give consent to treatment with Trigger Point Dry Needling: Yes TPDN with skilled palpation and monitoring followed by STM to the following muscles: bilat upper traps, bilat suboccipitials STM levator bilat, rib mobs Rt mid-thoracic  Review of scap/cervical retraction   11/22/22 TTP L UT   Manual: STM to L UT pre and post dry needling for trigger point identification and muscular relaxation.  Trigger Point Dry-Needling  Treatment instructions: Expect mild to moderate muscle soreness. S/S of pneumothorax if dry needled over a lung field, and to seek immediate medical attention should they occur. Patient verbalized understanding of these instructions and education.  Patient Consent Given: Yes Education handout provided: Previously provided Muscles treated: L UT Electrical stimulation performed: No Parameters: N/A Treatment response/outcome: twitch response, decrease in tissue tension, decrease in symptoms.  Seated UT stretch 3 x 30 second holds L Supine retraction; tuck+lift 10 x 5 second holds Seated thoracic extension over chair 1 x 10 Seated cervical retractions 1 x 10    10/24 Trigger Point Dry-Needling  Treatment instructions: Expect mild to moderate muscle soreness. S/S of pneumothorax if dry needled over a lung field, and to seek immediate medical attention should they occur. Patient verbalized  understanding of these instructions and education.  Patient Consent Given: Yes Education handout provided: Yes Muscles treated: upper trap 3x using .30x50 needle cervical spine left .30x50 neede 2s pots right upper trap 2 spots  Electrical stimulation performed: No Parameters: N/A Treatment response/outcome: great twitch   Manual: reviewed use of thera-cane for self trigger point release. Skilled palpation of trigger points. Sub-occipital release. Gentle manual traction. Thoracic PA gross mobilization    Bilateral er 2x15 red Bilateral horizontal abduction 2x15  red  Bilateral flexion  2x15 red band    Reviewed HEP and stretching   HOME EXERCISE PROGRAM: Access Code: [email or web address] URL: [email or web address]    ASSESSMENT:  CLINICAL IMPRESSION: Patient with soreness and trigger points in low back/ glute, targeted today as patient has been having trouble with mobility because of it. Improvement in symptoms following STM and ischemic pressure. Patient will continue to benefit from physical therapy in order to improve function and reduce impairment.     OBJECTIVE IMPAIRMENTS: decreased activity tolerance, decreased endurance, decreased mobility, decreased ROM, decreased strength, hypomobility, increased muscle spasms, impaired flexibility, impaired UE functional use, improper body mechanics, postural dysfunction, and pain.   ACTIVITY LIMITATIONS: carrying, lifting, bending, reach over head, and caring for others  PARTICIPATION LIMITATIONS: meal prep, cleaning, laundry, driving, shopping, community activity, and yard work  PERSONAL FACTORS: 3+ comorbidities: Afib, hx NSTEMI, hx transmetatarsal, amputation L, CHF, PVD, HLD, Ascending aortic aneurysm, DM  are also affecting patient's functional outcome.   REHAB POTENTIAL: Good  CLINICAL DECISION MAKING: Stable/uncomplicated  EVALUATION COMPLEXITY: Low   GOALS: Goals reviewed with patient? Yes  SHORT TERM GOALS:  Target date: 11/15/2022    Patient will be independent with HEP in order to improve functional outcomes. Baseline: Goal status: MET  2.  Patient will report at least 25% improvement in symptoms for improved quality of life. Baseline:  Goal status: MET    LONG TERM GOALS: Target date: 12/06/2022    Patient will report at least 75% improvement in symptoms for improved quality of life. Baseline:  Goal status: INITIAL  2.  Patient will improve FOTO score by at least 11 points in order to indicate improved tolerance to activity. Baseline: 56% function 12/06/22: 54% function Goal status: INITIAL  3.  Patient will demonstrate at least 25% improvement in cervical ROM in all restricted planes for improved ability to move head while driving. Baseline: see above Goal status: INITIAL  4.  Patient will be able to return to all activities unrestricted for improved ability to perform work functions and participate with family.  Baseline:  Goal status: INITIAL  5.  Patient will demonstrate grade of 5/5 MMT grade in all tested musculature as evidence of improved strength to assist with lifting at home. Baseline: see above Goal status: INITIAL      PLAN:  PT FREQUENCY: 1-2x/week  PT DURATION: 6 weeks  PLANNED INTERVENTIONS: Therapeutic exercises, Therapeutic activity, Neuromuscular re-education, Balance training, Gait training, Patient/Family education, Joint manipulation, Joint mobilization, Stair training, Orthotic/Fit training, DME instructions, Aquatic Therapy, Dry Needling, Electrical stimulation, Spinal manipulation, Spinal mobilization, Cryotherapy, Moist heat, Compression bandaging, scar mobilization, Splintting, Taping, Traction, Ultrasound, Ionotophoresis 4mg /ml Dexamethasone, and Manual therapy  PLAN FOR NEXT SESSION: f/u with HEP; possibly DN, postural strength, cervical mobility     Wyman Songster, PT 12/29/2022, 3:19 PM

## 2023-01-02 ENCOUNTER — Other Ambulatory Visit: Payer: Self-pay | Admitting: Thoracic Surgery (Cardiothoracic Vascular Surgery)

## 2023-01-02 DIAGNOSIS — I7121 Aneurysm of the ascending aorta, without rupture: Secondary | ICD-10-CM

## 2023-01-03 ENCOUNTER — Ambulatory Visit (HOSPITAL_BASED_OUTPATIENT_CLINIC_OR_DEPARTMENT_OTHER): Payer: Medicare Other | Admitting: Physical Therapy

## 2023-01-03 DIAGNOSIS — M542 Cervicalgia: Secondary | ICD-10-CM | POA: Diagnosis not present

## 2023-01-03 DIAGNOSIS — R29898 Other symptoms and signs involving the musculoskeletal system: Secondary | ICD-10-CM | POA: Diagnosis not present

## 2023-01-03 NOTE — Therapy (Signed)
OUTPATIENT PHYSICAL THERAPY CERVICAL Treatment   Patient Name: [name] MRN: [id number] DOB:1950/07/08, 72 y.o., male Today's Date: 01/03/2023    END OF SESSION:  PT End of Session - 01/03/23 1545     Visit Number 13    Number of Visits 24    Date for PT Re-Evaluation 01/17/23    Authorization Type Medicare    PT Start Time 1515    PT Stop Time 1558    PT Time Calculation (min) 43 min    Activity Tolerance Patient tolerated treatment well    Behavior During Therapy [facility] for tasks assessed/performed                Past Medical History:  Diagnosis Date   Anemia    low iron   Arthritis    Coronary artery disease 2006   a. remote stenting to ramus, prox LAD x2.   Dental crowns present    Diabetes mellitus    First degree AV block    GERD (gastroesophageal reflux disease)    Heart attack (HCC) 06/2004   Heart murmur    aortic   Hx MRSA infection 02/2006   Hyperlipidemia    Hypertension    hx. of - has not been on med. since losing wt. after gastric bypass   Hypothyroidism    Morbid obesity (HCC)    Mucoid cyst of joint 09/2011   left thumb   Sleep apnea    sleep study 03/29/2011; no CPAP use, lost >130lbs   Trifascicular block    Past Surgical History:  Procedure Laterality Date   AMPUTATION Left 04/19/2016   Procedure: Left Foot 4th Toe Amputation vs. Transmetatarsal;  Surgeon: Nadara Mustard, MD;  Location: [facility] OR;  Service: Orthopedics;  Laterality: Left;   BIOPSY  04/07/2019   Procedure: BIOPSY;  Surgeon: Kathi Der, MD;  Location: [facility];  Service: Gastroenterology;;   CATARACT EXTRACTION  02/2010; 03/2010   COLONOSCOPY WITH PROPOFOL N/A 09/14/2014   Procedure: COLONOSCOPY WITH PROPOFOL;  Surgeon: Charolett Bumpers, MD;  Location: [facility];  Service: Endoscopy;  Laterality: N/A;   COLONOSCOPY WITH PROPOFOL N/A 04/07/2019   Procedure: COLONOSCOPY WITH PROPOFOL;  Surgeon: Kathi Der, MD;  Location: [facility];  Service: Gastroenterology;   Laterality: N/A;   CORONARY ANGIOPLASTY WITH STENT PLACEMENT  07/12/2004; 08/03/2004   total of 3 stents   CORONARY STENT INTERVENTION N/A 08/23/2022   Procedure: CORONARY STENT INTERVENTION;  Surgeon: Swaziland, Peter M, MD;  Location: [facility] INVASIVE CV LAB;  Service: Cardiovascular;  Laterality: N/A;   I & D EXTREMITY Right 06/04/2014   Procedure: IRRIGATION AND DEBRIDEMENT EXTREMITY;  Surgeon: Kathryne Hitch, MD;  Location: [facility] OR;  Service: Orthopedics;  Laterality: Right;   LEFT HEART CATH AND CORONARY ANGIOGRAPHY N/A 08/23/2022   Procedure: LEFT HEART CATH AND CORONARY ANGIOGRAPHY;  Surgeon: Swaziland, Peter M, MD;  Location: [facility] INVASIVE CV LAB;  Service: Cardiovascular;  Laterality: N/A;   MASS EXCISION  10/04/2011   Procedure: EXCISION MASS;  Surgeon: Nicki Reaper, MD;  Location: [facility];  Service: Orthopedics;  Laterality: Left;  excision cyst debridment ip joint of left thumb   PROSTATECTOMY     right below knee amputation  2018   ROUX-EN-Y GASTRIC BYPASS  10/10/2010   laparoscopic   SHOULDER ARTHROSCOPY WITH ROTATOR CUFF REPAIR AND SUBACROMIAL DECOMPRESSION Right 03/23/2020   Procedure: RIGHT SHOULDER ARTHROSCOPY WITH DEBRIDEMENT AND  ROTATOR CUFF REPAIR;  Surgeon: Kathryne Hitch, MD;  Location:  [facility] OR;  Service: Orthopedics;  Laterality: Right;   TOE AMPUTATION  02/23/2006   left foot second ray amputation   TOTAL HIP ARTHROPLASTY Right 03/29/2020   Procedure: TOTAL HIP ARTHROPLASTY POSTERIOR APPROACH;  Surgeon: Tarry Kos, MD;  Location: [facility] OR;  Service: Orthopedics;  Laterality: Right;   TRANSMETATARSAL AMPUTATION Left    TRIGGER FINGER RELEASE Right 09/16/2019   Procedure: RELEASE TRIGGER FINGER/A-1 PULLEY;  Surgeon: Cindee Salt, MD;  Location: [facility];  Service: Orthopedics;  Laterality: Right;  IV REGIONAL FOREARM BLOCK   Patient Active Problem List   Diagnosis Date Noted   Paroxysmal atrial fibrillation with RVR (HCC) 08/23/2022   Ascending  aortic aneurysm (HCC) 08/23/2022   Bradycardia 08/22/2022   NSTEMI (non-ST elevated myocardial infarction) (HCC) 08/22/2022   Atrial fibrillation with RVR (HCC) 08/22/2022   Hypotension 01/07/2021   S/P arthroscopy of right shoulder 06/02/2020   Closed fracture of neck of right femur (HCC)    Hip fracture (HCC) 03/27/2020   Nontraumatic complete tear of right rotator cuff 02/26/2020   S/P transmetatarsal amputation of foot, left (HCC) 04/25/2016   Chronic osteomyelitis of toe, left (HCC)    Chronic diastolic CHF (congestive heart failure) (HCC) 09/25/2015   Aortic stenosis 08/04/2014   Diabetic foot infection (HCC) 06/03/2014   Peripheral vascular disease (HCC) 06/03/2014   Coronary artery disease involving native coronary artery of native heart without angina pectoris 07/29/2013   Trifascicular block 07/29/2013   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 07/26/2011   Hypothyroidism 01/22/2007   Type 2 diabetes mellitus with vascular disease (HCC) 01/22/2007   HYPERLIPIDEMIA, MIXED 01/22/2007   MYOCARDIAL INFARCTION, HX OF 01/22/2007   ANEMIA, IRON DEFICIENCY, HX OF 01/22/2007    PCP: Hillard Danker MD  REFERRING PROVIDER: Kirtland Bouchard, PA-C  REFERRING DIAG: M54.2 (ICD-10-CM) - Cervicalgia  THERAPY DIAG:  Cervicalgia  Other symptoms and signs involving the musculoskeletal system  Rationale for Evaluation and Treatment: Rehabilitation  ONSET DATE: 2.5-3 months  SUBJECTIVE:                                                                                                                                                                                                         SUBJECTIVE STATEMENT: The patient reports his back was sore after the last session but it is better now. His neck is better but still sore.    PERTINENT HISTORY:  Afib, hx NSTEMI, hx transmetatarsal amputation L, R BKA, CHF, PVD, HLD, Ascending aortic aneurysm, DM  PAIN:  Are you having pain?  Yes: NPRS  scale: 3 intermittent/10 Pain location: L cervical spine Pain description: sharp Aggravating factors: L rotation Relieving factors: rest  PRECAUTIONS: None  WEIGHT BEARING RESTRICTIONS: No  FALLS:  Has patient fallen in last 6 months? No  PLOF: Independent  PATIENT GOALS: turn to left and not have pain   OBJECTIVE: (objective measures from initial evaluation unless otherwise dated)  DIAGNOSTIC FINDINGS:  XR 09/14/22: Cervical spine 3 views: Shows normal lordotic curvature.  Disc base overall well-maintained.  No acute fractures no spondylolisthesis.  Minimal arthritic changes.   PATIENT SURVEYS:  FOTO 56% function  12/06/22: 54 % function  COGNITION: Overall cognitive status: Within functional limits for tasks assessed  SENSATION: WFL  POSTURE: rounded shoulders, forward head, and increased thoracic kyphosis  PALPATION: TTP bilateral UT, concordant on L, minimal to no tenderness throughout rest of cervical spine; hypomobile and lordotic c/sp  CERVICAL ROM:   Active ROM A/PROM (deg) eval AROM 11/2 Pre//post DN 12/06/22  Flexion 27  35  Extension 40  36  Right lateral flexion 30 * 24//30 21  Left lateral flexion 23 * 14*//30 13  Right rotation 58 70//70 79  Left rotation 59 * 60*//64 72   (Blank rows = not tested) *=pain/symptoms  UPPER EXTREMITY ROM: WFL for tasks assessed  Active ROM Right eval Left eval  Shoulder flexion    Shoulder extension    Shoulder abduction    Shoulder adduction    Shoulder extension    Shoulder internal rotation    Shoulder external rotation    Elbow flexion    Elbow extension    Wrist flexion    Wrist extension    Wrist ulnar deviation    Wrist radial deviation    Wrist pronation    Wrist supination     (Blank rows = not tested) *=pain/symptoms  UPPER EXTREMITY MMT:  MMT Right eval Left eval Right 12/06/22 Left 12/06/22  Shoulder flexion 4+ 4+ 4+ 4+  Shoulder extension      Shoulder abduction 4+ 4+ 4+ 4+   Shoulder adduction      Shoulder extension      Shoulder internal rotation      Shoulder external rotation      Middle trapezius      Lower trapezius      Elbow flexion 5 5    Elbow extension 5 5    Wrist flexion      Wrist extension      Wrist ulnar deviation      Wrist radial deviation      Wrist pronation      Wrist supination      Grip strength       (Blank rows = not tested) *=pain/symptoms  CERVICAL SPECIAL TESTS:  Distraction test: Negative    TODAY'S TREATMENT:  12/11  Manual: reviewed  self trigger point release. Skilled palpation of trigger points. Sub-occipital release. Gentle manual traction. Thoracic PA gross mobilization   Row 3x12 15 lbs  Shoulder extension 3x12 15 lbs       12/29/22 Manual: STM to L lumbar paraspinals and L glutes  Open book 10 x 5 second holds bilateral  Standing hip abduction 2 x 10  11/22  Manual: reviewed use of thera-cane for self trigger point release. Skilled palpation of trigger points. Sub-occipital release. Gentle manual traction. Thoracic PA gross mobilization      12/08/22 TTP and hyperactive L UT Manual: STM to L UT Seated L rotation SNAG 2 x 10 Seated extension SNAG 2 x 10 Standing bilateral shoulder ER BTB 2 x 10 Standing shoulder horizontal abduction BTB 2 x 10  Standing shoulder PNF D2 GTB 2 x 10   12/06/22 Reassessment TTP and hyperactive L UT Seated L rotation SNAG 2 x 10 Standing row BTB 3 x 10  12/01/22 Manual: STM to L UT Seated cervical retractions 2 x 10  Seated L rotation SNAG 2 x 10 Standing row BTB 3 x 10   11/29/22 Manual: STM to L UT pre and post dry needling for trigger point identification and muscular relaxation.; manual UT stretch  Trigger Point Dry-Needling  Treatment instructions: Expect mild to moderate muscle soreness. S/S of pneumothorax if dry needled  over a lung field, and to seek immediate medical attention should they occur. Patient verbalized understanding of these instructions and education.  Patient Consent Given: Yes Education handout provided: Previously provided Muscles treated: L UT Electrical stimulation performed: No Parameters: N/A Treatment response/outcome: twitch response, decrease in tissue tension, decrease in symptoms.  Seated cervical retractions 1 x 10    Treatment                            11/2:  Trigger Point Dry Needling, Manual Therapy Treatment:  Initial or subsequent education regarding Trigger Point Dry Needling: Subsequent Did patient give consent to treatment with Trigger Point Dry Needling: Yes TPDN with skilled palpation and monitoring followed by STM to the following muscles: bilat upper traps, bilat suboccipitials STM levator bilat, rib mobs Rt mid-thoracic  Review of scap/cervical retraction   11/22/22 TTP L UT   Manual: STM to L UT pre and post dry needling for trigger point identification and muscular relaxation.  Trigger Point Dry-Needling  Treatment instructions: Expect mild to moderate muscle soreness. S/S of pneumothorax if dry needled over a lung field, and to seek immediate medical attention should they occur. Patient verbalized understanding of these instructions and education.  Patient Consent Given: Yes Education handout provided: Previously provided Muscles treated: L UT Electrical stimulation performed: No Parameters: N/A Treatment response/outcome: twitch response, decrease in tissue tension, decrease in symptoms.  Seated UT stretch 3 x 30 second holds L Supine retraction; tuck+lift 10 x 5 second holds Seated thoracic extension over chair 1 x 10 Seated cervical retractions 1 x 10    10/24 Trigger Point Dry-Needling  Treatment instructions: Expect mild to moderate muscle soreness. S/S of pneumothorax if dry needled over a lung field, and to seek immediate medical attention  should they occur. Patient verbalized understanding of these instructions and education.  Patient Consent Given: Yes Education handout provided: Yes Muscles treated: upper trap 3x using .30x50 needle cervical spine left .30x50 neede 2s pots right upper trap 2 spots  Electrical stimulation performed: No Parameters: N/A  Treatment response/outcome: great twitch   Manual: reviewed use of thera-cane for self trigger point release. Skilled palpation of trigger points. Sub-occipital release. Gentle manual traction. Thoracic PA gross mobilization    Bilateral er 2x15 red Bilateral horizontal abduction 2x15 red  Bilateral flexion  2x15 red band    Reviewed HEP and stretching   HOME EXERCISE PROGRAM: Access Code: [email or web address] URL: [email or web address]    ASSESSMENT:  CLINICAL IMPRESSION: The patient continues to have a large trigger point in his upper trap. Therapy continues to perform manual therapy to that area. We also worked on posterior chain strengthening. Therapy will continue to progress as tolerated.   OBJECTIVE IMPAIRMENTS: decreased activity tolerance, decreased endurance, decreased mobility, decreased ROM, decreased strength, hypomobility, increased muscle spasms, impaired flexibility, impaired UE functional use, improper body mechanics, postural dysfunction, and pain.   ACTIVITY LIMITATIONS: carrying, lifting, bending, reach over head, and caring for others  PARTICIPATION LIMITATIONS: meal prep, cleaning, laundry, driving, shopping, community activity, and yard work  PERSONAL FACTORS: 3+ comorbidities: Afib, hx NSTEMI, hx transmetatarsal, amputation L, CHF, PVD, HLD, Ascending aortic aneurysm, DM  are also affecting patient's functional outcome.   REHAB POTENTIAL: Good  CLINICAL DECISION MAKING: Stable/uncomplicated  EVALUATION COMPLEXITY: Low   GOALS: Goals reviewed with patient? Yes  SHORT TERM GOALS: Target date: 11/15/2022    Patient will be  independent with HEP in order to improve functional outcomes. Baseline: Goal status: MET  2.  Patient will report at least 25% improvement in symptoms for improved quality of life. Baseline:  Goal status: MET    LONG TERM GOALS: Target date: 12/06/2022    Patient will report at least 75% improvement in symptoms for improved quality of life. Baseline:  Goal status: INITIAL  2.  Patient will improve FOTO score by at least 11 points in order to indicate improved tolerance to activity. Baseline: 56% function 12/06/22: 54% function Goal status: INITIAL  3.  Patient will demonstrate at least 25% improvement in cervical ROM in all restricted planes for improved ability to move head while driving. Baseline: see above Goal status: INITIAL  4.  Patient will be able to return to all activities unrestricted for improved ability to perform work functions and participate with family.  Baseline:  Goal status: INITIAL  5.  Patient will demonstrate grade of 5/5 MMT grade in all tested musculature as evidence of improved strength to assist with lifting at home. Baseline: see above Goal status: INITIAL      PLAN:  PT FREQUENCY: 1-2x/week  PT DURATION: 6 weeks  PLANNED INTERVENTIONS: Therapeutic exercises, Therapeutic activity, Neuromuscular re-education, Balance training, Gait training, Patient/Family education, Joint manipulation, Joint mobilization, Stair training, Orthotic/Fit training, DME instructions, Aquatic Therapy, Dry Needling, Electrical stimulation, Spinal manipulation, Spinal mobilization, Cryotherapy, Moist heat, Compression bandaging, scar mobilization, Splintting, Taping, Traction, Ultrasound, Ionotophoresis 4mg /ml Dexamethasone, and Manual therapy  PLAN FOR NEXT SESSION: f/u with HEP; possibly DN, postural strength, cervical mobility     Dessie Coma, PT 01/03/2023, 3:46 PM

## 2023-01-05 ENCOUNTER — Encounter (HOSPITAL_BASED_OUTPATIENT_CLINIC_OR_DEPARTMENT_OTHER): Payer: Self-pay | Admitting: Physical Therapy

## 2023-01-05 ENCOUNTER — Ambulatory Visit (HOSPITAL_BASED_OUTPATIENT_CLINIC_OR_DEPARTMENT_OTHER): Payer: Medicare Other | Admitting: Physical Therapy

## 2023-01-05 DIAGNOSIS — R29898 Other symptoms and signs involving the musculoskeletal system: Secondary | ICD-10-CM

## 2023-01-05 DIAGNOSIS — M542 Cervicalgia: Secondary | ICD-10-CM

## 2023-01-05 NOTE — Therapy (Signed)
OUTPATIENT PHYSICAL THERAPY CERVICAL Treatment   Patient Name: [name] MRN: [id number] DOB:03-17-1950, 72 y.o., male Today's Date: 01/05/2023    END OF SESSION:  PT End of Session - 01/05/23 1304     Visit Number 14    Number of Visits 24    Date for PT Re-Evaluation 01/17/23    Authorization Type Medicare    PT Start Time 1303    PT Stop Time 1343    PT Time Calculation (min) 40 min    Activity Tolerance Patient tolerated treatment well    Behavior During Therapy [facility] for tasks assessed/performed                Past Medical History:  Diagnosis Date   Anemia    low iron   Arthritis    Coronary artery disease 2006   a. remote stenting to ramus, prox LAD x2.   Dental crowns present    Diabetes mellitus    First degree AV block    GERD (gastroesophageal reflux disease)    Heart attack (HCC) 06/2004   Heart murmur    aortic   Hx MRSA infection 02/2006   Hyperlipidemia    Hypertension    hx. of - has not been on med. since losing wt. after gastric bypass   Hypothyroidism    Morbid obesity (HCC)    Mucoid cyst of joint 09/2011   left thumb   Sleep apnea    sleep study 03/29/2011; no CPAP use, lost >130lbs   Trifascicular block    Past Surgical History:  Procedure Laterality Date   AMPUTATION Left 04/19/2016   Procedure: Left Foot 4th Toe Amputation vs. Transmetatarsal;  Surgeon: Nadara Mustard, MD;  Location: [facility] OR;  Service: Orthopedics;  Laterality: Left;   BIOPSY  04/07/2019   Procedure: BIOPSY;  Surgeon: Kathi Der, MD;  Location: [facility];  Service: Gastroenterology;;   CATARACT EXTRACTION  02/2010; 03/2010   COLONOSCOPY WITH PROPOFOL N/A 09/14/2014   Procedure: COLONOSCOPY WITH PROPOFOL;  Surgeon: Charolett Bumpers, MD;  Location: [facility];  Service: Endoscopy;  Laterality: N/A;   COLONOSCOPY WITH PROPOFOL N/A 04/07/2019   Procedure: COLONOSCOPY WITH PROPOFOL;  Surgeon: Kathi Der, MD;  Location: [facility];  Service: Gastroenterology;   Laterality: N/A;   CORONARY ANGIOPLASTY WITH STENT PLACEMENT  07/12/2004; 08/03/2004   total of 3 stents   CORONARY STENT INTERVENTION N/A 08/23/2022   Procedure: CORONARY STENT INTERVENTION;  Surgeon: Swaziland, Peter M, MD;  Location: [facility] INVASIVE CV LAB;  Service: Cardiovascular;  Laterality: N/A;   I & D EXTREMITY Right 06/04/2014   Procedure: IRRIGATION AND DEBRIDEMENT EXTREMITY;  Surgeon: Kathryne Hitch, MD;  Location: [facility] OR;  Service: Orthopedics;  Laterality: Right;   LEFT HEART CATH AND CORONARY ANGIOGRAPHY N/A 08/23/2022   Procedure: LEFT HEART CATH AND CORONARY ANGIOGRAPHY;  Surgeon: Swaziland, Peter M, MD;  Location: [facility] INVASIVE CV LAB;  Service: Cardiovascular;  Laterality: N/A;   MASS EXCISION  10/04/2011   Procedure: EXCISION MASS;  Surgeon: Nicki Reaper, MD;  Location: [facility];  Service: Orthopedics;  Laterality: Left;  excision cyst debridment ip joint of left thumb   PROSTATECTOMY     right below knee amputation  2018   ROUX-EN-Y GASTRIC BYPASS  10/10/2010   laparoscopic   SHOULDER ARTHROSCOPY WITH ROTATOR CUFF REPAIR AND SUBACROMIAL DECOMPRESSION Right 03/23/2020   Procedure: RIGHT SHOULDER ARTHROSCOPY WITH DEBRIDEMENT AND  ROTATOR CUFF REPAIR;  Surgeon: Kathryne Hitch, MD;  Location:  [facility] OR;  Service: Orthopedics;  Laterality: Right;   TOE AMPUTATION  02/23/2006   left foot second ray amputation   TOTAL HIP ARTHROPLASTY Right 03/29/2020   Procedure: TOTAL HIP ARTHROPLASTY POSTERIOR APPROACH;  Surgeon: Tarry Kos, MD;  Location: [facility] OR;  Service: Orthopedics;  Laterality: Right;   TRANSMETATARSAL AMPUTATION Left    TRIGGER FINGER RELEASE Right 09/16/2019   Procedure: RELEASE TRIGGER FINGER/A-1 PULLEY;  Surgeon: Cindee Salt, MD;  Location:  SURGERY CENTER;  Service: Orthopedics;  Laterality: Right;  IV REGIONAL FOREARM BLOCK   Patient Active Problem List   Diagnosis Date Noted   Paroxysmal atrial fibrillation with RVR (HCC) 08/23/2022   Ascending  aortic aneurysm (HCC) 08/23/2022   Bradycardia 08/22/2022   NSTEMI (non-ST elevated myocardial infarction) (HCC) 08/22/2022   Atrial fibrillation with RVR (HCC) 08/22/2022   Hypotension 01/07/2021   S/P arthroscopy of right shoulder 06/02/2020   Closed fracture of neck of right femur (HCC)    Hip fracture (HCC) 03/27/2020   Nontraumatic complete tear of right rotator cuff 02/26/2020   S/P transmetatarsal amputation of foot, left (HCC) 04/25/2016   Chronic osteomyelitis of toe, left (HCC)    Chronic diastolic CHF (congestive heart failure) (HCC) 09/25/2015   Aortic stenosis 08/04/2014   Diabetic foot infection (HCC) 06/03/2014   Peripheral vascular disease (HCC) 06/03/2014   Coronary artery disease involving native coronary artery of native heart without angina pectoris 07/29/2013   Trifascicular block 07/29/2013   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 07/26/2011   Hypothyroidism 01/22/2007   Type 2 diabetes mellitus with vascular disease (HCC) 01/22/2007   HYPERLIPIDEMIA, MIXED 01/22/2007   MYOCARDIAL INFARCTION, HX OF 01/22/2007   ANEMIA, IRON DEFICIENCY, HX OF 01/22/2007    PCP: Hillard Danker MD  REFERRING PROVIDER: Kirtland Bouchard, PA-C  REFERRING DIAG: M54.2 (ICD-10-CM) - Cervicalgia  THERAPY DIAG:  Cervicalgia  Other symptoms and signs involving the musculoskeletal system  Rationale for Evaluation and Treatment: Rehabilitation  ONSET DATE: 2.5-3 months  SUBJECTIVE:                                                                                                                                                                                                         SUBJECTIVE STATEMENT: The patient reports his back was sore after the last session but it is better now. His neck is better but still sore.    PERTINENT HISTORY:  Afib, hx NSTEMI, hx transmetatarsal amputation L, R BKA, CHF, PVD, HLD, Ascending aortic aneurysm, DM  PAIN:  Are you having pain?  Yes: NPRS  scale: 3 intermittent/10 Pain location: L cervical spine Pain description: sharp Aggravating factors: L rotation Relieving factors: rest  PRECAUTIONS: None  WEIGHT BEARING RESTRICTIONS: No  FALLS:  Has patient fallen in last 6 months? No  PLOF: Independent  PATIENT GOALS: turn to left and not have pain   OBJECTIVE: (objective measures from initial evaluation unless otherwise dated)  DIAGNOSTIC FINDINGS:  XR 09/14/22: Cervical spine 3 views: Shows normal lordotic curvature.  Disc base overall well-maintained.  No acute fractures no spondylolisthesis.  Minimal arthritic changes.   PATIENT SURVEYS:  FOTO 56% function  12/06/22: 54 % function  COGNITION: Overall cognitive status: Within functional limits for tasks assessed  SENSATION: WFL  POSTURE: rounded shoulders, forward head, and increased thoracic kyphosis  PALPATION: TTP bilateral UT, concordant on L, minimal to no tenderness throughout rest of cervical spine; hypomobile and lordotic c/sp  CERVICAL ROM:   Active ROM A/PROM (deg) eval AROM 11/2 Pre//post DN 12/06/22  Flexion 27  35  Extension 40  36  Right lateral flexion 30 * 24//30 21  Left lateral flexion 23 * 14*//30 13  Right rotation 58 70//70 79  Left rotation 59 * 60*//64 72   (Blank rows = not tested) *=pain/symptoms  UPPER EXTREMITY ROM: WFL for tasks assessed  Active ROM Right eval Left eval  Shoulder flexion    Shoulder extension    Shoulder abduction    Shoulder adduction    Shoulder extension    Shoulder internal rotation    Shoulder external rotation    Elbow flexion    Elbow extension    Wrist flexion    Wrist extension    Wrist ulnar deviation    Wrist radial deviation    Wrist pronation    Wrist supination     (Blank rows = not tested) *=pain/symptoms  UPPER EXTREMITY MMT:  MMT Right eval Left eval Right 12/06/22 Left 12/06/22  Shoulder flexion 4+ 4+ 4+ 4+  Shoulder extension      Shoulder abduction 4+ 4+ 4+ 4+   Shoulder adduction      Shoulder extension      Shoulder internal rotation      Shoulder external rotation      Middle trapezius      Lower trapezius      Elbow flexion 5 5    Elbow extension 5 5    Wrist flexion      Wrist extension      Wrist ulnar deviation      Wrist radial deviation      Wrist pronation      Wrist supination      Grip strength       (Blank rows = not tested) *=pain/symptoms  CERVICAL SPECIAL TESTS:  Distraction test: Negative    TODAY'S TREATMENT:  01/05/23 UBE 2/2 retro/forward Manual: STM to L UT, gross T/sp PA glides Row 15# 3 x 12 Shoulder extension 15# 3 x 12 Lat pull down 40# 2 x 10  12/11  Manual: reviewed  self trigger point release. Skilled palpation of trigger points. Sub-occipital release. Gentle manual traction. Thoracic PA gross mobilization   Row 3x12 15 lbs  Shoulder extension 3x12 15 lbs       12/29/22 Manual: STM to L lumbar paraspinals and L glutes  Open book 10 x 5 second holds bilateral  Standing hip abduction 2 x 10  11/22  Manual: reviewed use of thera-cane for self trigger point release. Skilled palpation of trigger points. Sub-occipital release. Gentle manual traction. Thoracic PA gross mobilization      12/08/22 TTP and hyperactive L UT Manual: STM to L UT Seated L rotation SNAG 2 x 10 Seated extension SNAG 2 x 10 Standing bilateral shoulder ER BTB 2 x 10 Standing shoulder horizontal abduction BTB 2 x 10  Standing shoulder PNF D2 GTB 2 x 10   12/06/22 Reassessment TTP and hyperactive L UT Seated L rotation SNAG 2 x 10 Standing row BTB 3 x 10  12/01/22 Manual: STM to L UT Seated cervical retractions 2 x 10  Seated L rotation SNAG 2 x 10 Standing row BTB 3 x 10   11/29/22 Manual: STM to L UT pre and post dry needling for trigger point identification and muscular relaxation.;  manual UT stretch  Trigger Point Dry-Needling  Treatment instructions: Expect mild to moderate muscle soreness. S/S of pneumothorax if dry needled over a lung field, and to seek immediate medical attention should they occur. Patient verbalized understanding of these instructions and education.  Patient Consent Given: Yes Education handout provided: Previously provided Muscles treated: L UT Electrical stimulation performed: No Parameters: N/A Treatment response/outcome: twitch response, decrease in tissue tension, decrease in symptoms.  Seated cervical retractions 1 x 10    Treatment                            11/2:  Trigger Point Dry Needling, Manual Therapy Treatment:  Initial or subsequent education regarding Trigger Point Dry Needling: Subsequent Did patient give consent to treatment with Trigger Point Dry Needling: Yes TPDN with skilled palpation and monitoring followed by STM to the following muscles: bilat upper traps, bilat suboccipitials STM levator bilat, rib mobs Rt mid-thoracic  Review of scap/cervical retraction   11/22/22 TTP L UT   Manual: STM to L UT pre and post dry needling for trigger point identification and muscular relaxation.  Trigger Point Dry-Needling  Treatment instructions: Expect mild to moderate muscle soreness. S/S of pneumothorax if dry needled over a lung field, and to seek immediate medical attention should they occur. Patient verbalized understanding of these instructions and education.  Patient Consent Given: Yes Education handout provided: Previously provided Muscles treated: L UT Electrical stimulation performed: No Parameters: N/A Treatment response/outcome: twitch response, decrease in tissue tension, decrease in symptoms.  Seated UT stretch 3 x 30 second holds L Supine retraction; tuck+lift 10 x 5 second holds Seated thoracic extension over chair 1 x 10 Seated cervical retractions 1 x 10    10/24 Trigger Point Dry-Needling   Treatment instructions: Expect mild to moderate muscle soreness. S/S of pneumothorax if dry needled over a lung field, and to seek immediate medical attention should they occur. Patient verbalized understanding of these instructions and education.  Patient Consent Given:  Yes Education handout provided: Yes Muscles treated: upper trap 3x using .30x50 needle cervical spine left .30x50 neede 2s pots right upper trap 2 spots  Electrical stimulation performed: No Parameters: N/A Treatment response/outcome: great twitch   Manual: reviewed use of thera-cane for self trigger point release. Skilled palpation of trigger points. Sub-occipital release. Gentle manual traction. Thoracic PA gross mobilization    Bilateral er 2x15 red Bilateral horizontal abduction 2x15 red  Bilateral flexion  2x15 red band    Reviewed HEP and stretching   HOME EXERCISE PROGRAM: Access Code: [email or web address] URL: [email or web address]    ASSESSMENT:  CLINICAL IMPRESSION: Began session on UBE for dynamic warm up. Continued with manual therapy with decrease in tissue tension following. Good performance of UE/postural strengthening. Patient will continue to benefit from physical therapy in order to improve function and reduce impairment.   OBJECTIVE IMPAIRMENTS: decreased activity tolerance, decreased endurance, decreased mobility, decreased ROM, decreased strength, hypomobility, increased muscle spasms, impaired flexibility, impaired UE functional use, improper body mechanics, postural dysfunction, and pain.   ACTIVITY LIMITATIONS: carrying, lifting, bending, reach over head, and caring for others  PARTICIPATION LIMITATIONS: meal prep, cleaning, laundry, driving, shopping, community activity, and yard work  PERSONAL FACTORS: 3+ comorbidities: Afib, hx NSTEMI, hx transmetatarsal, amputation L, CHF, PVD, HLD, Ascending aortic aneurysm, DM  are also affecting patient's functional outcome.   REHAB POTENTIAL:  Good  CLINICAL DECISION MAKING: Stable/uncomplicated  EVALUATION COMPLEXITY: Low   GOALS: Goals reviewed with patient? Yes  SHORT TERM GOALS: Target date: 11/15/2022    Patient will be independent with HEP in order to improve functional outcomes. Baseline: Goal status: MET  2.  Patient will report at least 25% improvement in symptoms for improved quality of life. Baseline:  Goal status: MET    LONG TERM GOALS: Target date: 12/06/2022    Patient will report at least 75% improvement in symptoms for improved quality of life. Baseline:  Goal status: INITIAL  2.  Patient will improve FOTO score by at least 11 points in order to indicate improved tolerance to activity. Baseline: 56% function 12/06/22: 54% function Goal status: INITIAL  3.  Patient will demonstrate at least 25% improvement in cervical ROM in all restricted planes for improved ability to move head while driving. Baseline: see above Goal status: INITIAL  4.  Patient will be able to return to all activities unrestricted for improved ability to perform work functions and participate with family.  Baseline:  Goal status: INITIAL  5.  Patient will demonstrate grade of 5/5 MMT grade in all tested musculature as evidence of improved strength to assist with lifting at home. Baseline: see above Goal status: INITIAL      PLAN:  PT FREQUENCY: 1-2x/week  PT DURATION: 6 weeks  PLANNED INTERVENTIONS: Therapeutic exercises, Therapeutic activity, Neuromuscular re-education, Balance training, Gait training, Patient/Family education, Joint manipulation, Joint mobilization, Stair training, Orthotic/Fit training, DME instructions, Aquatic Therapy, Dry Needling, Electrical stimulation, Spinal manipulation, Spinal mobilization, Cryotherapy, Moist heat, Compression bandaging, scar mobilization, Splintting, Taping, Traction, Ultrasound, Ionotophoresis 4mg /ml Dexamethasone, and Manual therapy  PLAN FOR NEXT SESSION: f/u with  HEP; possibly DN, postural strength, cervical mobility     Wyman Songster, PT 01/05/2023, 1:04 PM

## 2023-01-09 ENCOUNTER — Encounter (HOSPITAL_BASED_OUTPATIENT_CLINIC_OR_DEPARTMENT_OTHER): Payer: Self-pay | Admitting: Physical Therapy

## 2023-01-09 ENCOUNTER — Ambulatory Visit (HOSPITAL_BASED_OUTPATIENT_CLINIC_OR_DEPARTMENT_OTHER): Payer: Medicare Other | Admitting: Physical Therapy

## 2023-01-09 DIAGNOSIS — M542 Cervicalgia: Secondary | ICD-10-CM

## 2023-01-09 DIAGNOSIS — R29898 Other symptoms and signs involving the musculoskeletal system: Secondary | ICD-10-CM | POA: Diagnosis not present

## 2023-01-09 NOTE — Therapy (Signed)
OUTPATIENT PHYSICAL THERAPY CERVICAL Treatment   Patient Name: SADAO HANNER MRN: [id number] DOB:1950/10/29, 72 y.o., male Today's Date: 01/09/2023    END OF SESSION:  PT End of Session - 01/09/23 1353     Visit Number 15    Number of Visits 24    Date for PT Re-Evaluation 01/17/23    Authorization Type Medicare    PT Start Time 1349    PT Stop Time 1429    PT Time Calculation (min) 40 min    Activity Tolerance Patient tolerated treatment well    Behavior During Therapy [facility] [place] for tasks assessed/performed                Past Medical History:  Diagnosis Date   Anemia    low iron   Arthritis    Coronary artery disease 2006   a. remote stenting to ramus, prox LAD x2.   Dental crowns present    Diabetes mellitus    First degree AV block    GERD (gastroesophageal reflux disease)    Heart attack (HCC) 06/2004   Heart murmur    aortic   Hx MRSA infection 02/2006   Hyperlipidemia    Hypertension    hx. of - has not been on med. since losing wt. after gastric bypass   Hypothyroidism    Morbid obesity (HCC)    Mucoid cyst of joint 09/2011   left thumb   Sleep apnea    sleep study 03/29/2011; no CPAP use, lost >130lbs   Trifascicular block    Past Surgical History:  Procedure Laterality Date   AMPUTATION Left 04/19/2016   Procedure: Left Foot 4th Toe Amputation vs. Transmetatarsal;  Surgeon: Nadara Mustard, MD;  Location: [facility] OR;  Service: Orthopedics;  Laterality: Left;   BIOPSY  04/07/2019   Procedure: BIOPSY;  Surgeon: Kathi Der, MD;  Location: [facility];  Service: Gastroenterology;;   CATARACT EXTRACTION  02/2010; 03/2010   COLONOSCOPY WITH PROPOFOL N/A 09/14/2014   Procedure: COLONOSCOPY WITH PROPOFOL;  Surgeon: Charolett Bumpers, MD;  Location: [facility];  Service: Endoscopy;  Laterality: N/A;   COLONOSCOPY WITH PROPOFOL N/A 04/07/2019   Procedure: COLONOSCOPY WITH PROPOFOL;  Surgeon: Kathi Der, MD;  Location: [facility];  Service: Gastroenterology;   Laterality: N/A;   CORONARY ANGIOPLASTY WITH STENT PLACEMENT  07/12/2004; 08/03/2004   total of 3 stents   CORONARY STENT INTERVENTION N/A 08/23/2022   Procedure: CORONARY STENT INTERVENTION;  Surgeon: Swaziland, Peter M, MD;  Location: [facility] INVASIVE CV LAB;  Service: Cardiovascular;  Laterality: N/A;   I & D EXTREMITY Right 06/04/2014   Procedure: IRRIGATION AND DEBRIDEMENT EXTREMITY;  Surgeon: Kathryne Hitch, MD;  Location: [facility] OR;  Service: Orthopedics;  Laterality: Right;   LEFT HEART CATH AND CORONARY ANGIOGRAPHY N/A 08/23/2022   Procedure: LEFT HEART CATH AND CORONARY ANGIOGRAPHY;  Surgeon: Swaziland, Peter M, MD;  Location: [facility] INVASIVE CV LAB;  Service: Cardiovascular;  Laterality: N/A;   MASS EXCISION  10/04/2011   Procedure: EXCISION MASS;  Surgeon: Nicki Reaper, MD;  Location: [facility];  Service: Orthopedics;  Laterality: Left;  excision cyst debridment ip joint of left thumb   PROSTATECTOMY     right below knee amputation  2018   ROUX-EN-Y GASTRIC BYPASS  10/10/2010   laparoscopic   SHOULDER ARTHROSCOPY WITH ROTATOR CUFF REPAIR AND SUBACROMIAL DECOMPRESSION Right 03/23/2020   Procedure: RIGHT SHOULDER ARTHROSCOPY WITH DEBRIDEMENT AND  ROTATOR CUFF REPAIR;  Surgeon: Kathryne Hitch, MD;  Location:  [facility] OR;  Service: Orthopedics;  Laterality: Right;   TOE AMPUTATION  02/23/2006   left foot second ray amputation   TOTAL HIP ARTHROPLASTY Right 03/29/2020   Procedure: TOTAL HIP ARTHROPLASTY POSTERIOR APPROACH;  Surgeon: Tarry Kos, MD;  Location: [facility] OR;  Service: Orthopedics;  Laterality: Right;   TRANSMETATARSAL AMPUTATION Left    TRIGGER FINGER RELEASE Right 09/16/2019   Procedure: RELEASE TRIGGER FINGER/A-1 PULLEY;  Surgeon: Cindee Salt, MD;  Location: [facility];  Service: Orthopedics;  Laterality: Right;  IV REGIONAL FOREARM BLOCK   Patient Active Problem List   Diagnosis Date Noted   Paroxysmal atrial fibrillation with RVR (HCC) 08/23/2022   Ascending  aortic aneurysm (HCC) 08/23/2022   Bradycardia 08/22/2022   NSTEMI (non-ST elevated myocardial infarction) (HCC) 08/22/2022   Atrial fibrillation with RVR (HCC) 08/22/2022   Hypotension 01/07/2021   S/P arthroscopy of right shoulder 06/02/2020   Closed fracture of neck of right femur (HCC)    Hip fracture (HCC) 03/27/2020   Nontraumatic complete tear of right rotator cuff 02/26/2020   S/P transmetatarsal amputation of foot, left (HCC) 04/25/2016   Chronic osteomyelitis of toe, left (HCC)    Chronic diastolic CHF (congestive heart failure) (HCC) 09/25/2015   Aortic stenosis 08/04/2014   Diabetic foot infection (HCC) 06/03/2014   Peripheral vascular disease (HCC) 06/03/2014   Coronary artery disease involving native coronary artery of native heart without angina pectoris 07/29/2013   Trifascicular block 07/29/2013   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 07/26/2011   Hypothyroidism 01/22/2007   Type 2 diabetes mellitus with vascular disease (HCC) 01/22/2007   HYPERLIPIDEMIA, MIXED 01/22/2007   MYOCARDIAL INFARCTION, HX OF 01/22/2007   ANEMIA, IRON DEFICIENCY, HX OF 01/22/2007    PCP: Hillard Danker MD  REFERRING PROVIDER: Kirtland Bouchard, PA-C  REFERRING DIAG: M54.2 (ICD-10-CM) - Cervicalgia  THERAPY DIAG:  Cervicalgia  Other symptoms and signs involving the musculoskeletal system  Rationale for Evaluation and Treatment: Rehabilitation  ONSET DATE: 2.5-3 months  SUBJECTIVE:                                                                                                                                                                                                         SUBJECTIVE STATEMENT: The patient reports his back was sore after the last session but it is better now. His neck is better but still sore.    PERTINENT HISTORY:  Afib, hx NSTEMI, hx transmetatarsal amputation L, R BKA, CHF, PVD, HLD, Ascending aortic aneurysm, DM  PAIN:  Are you having pain?  Yes: NPRS  scale: 3 intermittent/10 Pain location: L cervical spine Pain description: sharp Aggravating factors: L rotation Relieving factors: rest  PRECAUTIONS: None  WEIGHT BEARING RESTRICTIONS: No  FALLS:  Has patient fallen in last 6 months? No  PLOF: Independent  PATIENT GOALS: turn to left and not have pain   OBJECTIVE: (objective measures from initial evaluation unless otherwise dated)  DIAGNOSTIC FINDINGS:  XR 09/14/22: Cervical spine 3 views: Shows normal lordotic curvature.  Disc base overall well-maintained.  No acute fractures no spondylolisthesis.  Minimal arthritic changes.   PATIENT SURVEYS:  FOTO 56% function  12/06/22: 54 % function  COGNITION: Overall cognitive status: Within functional limits for tasks assessed  SENSATION: WFL  POSTURE: rounded shoulders, forward head, and increased thoracic kyphosis  PALPATION: TTP bilateral UT, concordant on L, minimal to no tenderness throughout rest of cervical spine; hypomobile and lordotic c/sp  CERVICAL ROM:   Active ROM A/PROM (deg) eval AROM 11/2 Pre//post DN 12/06/22  Flexion 27  35  Extension 40  36  Right lateral flexion 30 * 24//30 21  Left lateral flexion 23 * 14*//30 13  Right rotation 58 70//70 79  Left rotation 59 * 60*//64 72   (Blank rows = not tested) *=pain/symptoms  UPPER EXTREMITY ROM: WFL for tasks assessed  Active ROM Right eval Left eval  Shoulder flexion    Shoulder extension    Shoulder abduction    Shoulder adduction    Shoulder extension    Shoulder internal rotation    Shoulder external rotation    Elbow flexion    Elbow extension    Wrist flexion    Wrist extension    Wrist ulnar deviation    Wrist radial deviation    Wrist pronation    Wrist supination     (Blank rows = not tested) *=pain/symptoms  UPPER EXTREMITY MMT:  MMT Right eval Left eval Right 12/06/22 Left 12/06/22  Shoulder flexion 4+ 4+ 4+ 4+  Shoulder extension      Shoulder abduction 4+ 4+ 4+ 4+   Shoulder adduction      Shoulder extension      Shoulder internal rotation      Shoulder external rotation      Middle trapezius      Lower trapezius      Elbow flexion 5 5    Elbow extension 5 5    Wrist flexion      Wrist extension      Wrist ulnar deviation      Wrist radial deviation      Wrist pronation      Wrist supination      Grip strength       (Blank rows = not tested) *=pain/symptoms  CERVICAL SPECIAL TESTS:  Distraction test: Negative    TODAY'S TREATMENT:  01/09/23 UBE 2/2 retro/forward Manual: STM to L UT, gross T/sp PA glides Row 20# 3 x 12 Shoulder extension 20# 3 x 12 Lat pull down 40# 2 x 15   01/05/23 UBE 2/2 retro/forward Manual: STM to L UT, gross T/sp PA glides Row 15# 3 x 12 Shoulder extension 15# 3 x 12 Lat pull down 40# 2 x 10  12/11  Manual: reviewed  self trigger point release. Skilled palpation of trigger points. Sub-occipital release. Gentle manual traction. Thoracic PA gross mobilization   Row 3x12 15 lbs  Shoulder extension 3x12 15 lbs       12/29/22 Manual: STM to L lumbar paraspinals and L glutes  Open book 10 x 5 second holds bilateral  Standing hip abduction 2 x 10  11/22  Manual: reviewed use of thera-cane for self trigger point release. Skilled palpation of trigger points. Sub-occipital release. Gentle manual traction. Thoracic PA gross mobilization      12/08/22 TTP and hyperactive L UT Manual: STM to L UT Seated L rotation SNAG 2 x 10 Seated extension SNAG 2 x 10 Standing bilateral shoulder ER BTB 2 x 10 Standing shoulder horizontal abduction BTB 2 x 10  Standing shoulder PNF D2 GTB 2 x 10   12/06/22 Reassessment TTP and hyperactive L UT Seated L rotation SNAG 2 x 10 Standing row BTB 3 x 10  12/01/22 Manual: STM to L UT Seated cervical retractions 2 x 10  Seated L rotation SNAG  2 x 10 Standing row BTB 3 x 10   11/29/22 Manual: STM to L UT pre and post dry needling for trigger point identification and muscular relaxation.; manual UT stretch  Trigger Point Dry-Needling  Treatment instructions: Expect mild to moderate muscle soreness. S/S of pneumothorax if dry needled over a lung field, and to seek immediate medical attention should they occur. Patient verbalized understanding of these instructions and education.  Patient Consent Given: Yes Education handout provided: Previously provided Muscles treated: L UT Electrical stimulation performed: No Parameters: N/A Treatment response/outcome: twitch response, decrease in tissue tension, decrease in symptoms.  Seated cervical retractions 1 x 10    Treatment                            11/2:  Trigger Point Dry Needling, Manual Therapy Treatment:  Initial or subsequent education regarding Trigger Point Dry Needling: Subsequent Did patient give consent to treatment with Trigger Point Dry Needling: Yes TPDN with skilled palpation and monitoring followed by STM to the following muscles: bilat upper traps, bilat suboccipitials STM levator bilat, rib mobs Rt mid-thoracic  Review of scap/cervical retraction   11/22/22 TTP L UT   Manual: STM to L UT pre and post dry needling for trigger point identification and muscular relaxation.  Trigger Point Dry-Needling  Treatment instructions: Expect mild to moderate muscle soreness. S/S of pneumothorax if dry needled over a lung field, and to seek immediate medical attention should they occur. Patient verbalized understanding of these instructions and education.  Patient Consent Given: Yes Education handout provided: Previously provided Muscles treated: L UT Electrical stimulation performed: No Parameters: N/A Treatment response/outcome: twitch response, decrease in tissue tension, decrease in symptoms.  Seated UT stretch 3 x 30 second holds L Supine retraction; tuck+lift  10 x 5 second holds Seated thoracic extension over chair 1 x 10 Seated cervical retractions 1 x 10    10/24 Trigger Point Dry-Needling  Treatment instructions: Expect mild to moderate  muscle soreness. S/S of pneumothorax if dry needled over a lung field, and to seek immediate medical attention should they occur. Patient verbalized understanding of these instructions and education.  Patient Consent Given: Yes Education handout provided: Yes Muscles treated: upper trap 3x using .30x50 needle cervical spine left .30x50 neede 2s pots right upper trap 2 spots  Electrical stimulation performed: No Parameters: N/A Treatment response/outcome: great twitch   Manual: reviewed use of thera-cane for self trigger point release. Skilled palpation of trigger points. Sub-occipital release. Gentle manual traction. Thoracic PA gross mobilization    Bilateral er 2x15 red Bilateral horizontal abduction 2x15 red  Bilateral flexion  2x15 red band    Reviewed HEP and stretching   HOME EXERCISE PROGRAM: Access Code: [email or web address] URL: [email or web address]    ASSESSMENT:  CLINICAL IMPRESSION: Began session on UBE for dynamic warm up. Continued with manual therapy with decrease in tissue tension following. Continued with UE/postural strengthening which is tolerated well. Patient will continue to benefit from physical therapy in order to improve function and reduce impairment.   OBJECTIVE IMPAIRMENTS: decreased activity tolerance, decreased endurance, decreased mobility, decreased ROM, decreased strength, hypomobility, increased muscle spasms, impaired flexibility, impaired UE functional use, improper body mechanics, postural dysfunction, and pain.   ACTIVITY LIMITATIONS: carrying, lifting, bending, reach over head, and caring for others  PARTICIPATION LIMITATIONS: meal prep, cleaning, laundry, driving, shopping, community activity, and yard work  PERSONAL FACTORS: 3+ comorbidities: Afib,  hx NSTEMI, hx transmetatarsal, amputation L, CHF, PVD, HLD, Ascending aortic aneurysm, DM  are also affecting patient's functional outcome.   REHAB POTENTIAL: Good  CLINICAL DECISION MAKING: Stable/uncomplicated  EVALUATION COMPLEXITY: Low   GOALS: Goals reviewed with patient? Yes  SHORT TERM GOALS: Target date: 11/15/2022    Patient will be independent with HEP in order to improve functional outcomes. Baseline: Goal status: MET  2.  Patient will report at least 25% improvement in symptoms for improved quality of life. Baseline:  Goal status: MET    LONG TERM GOALS: Target date: 12/06/2022    Patient will report at least 75% improvement in symptoms for improved quality of life. Baseline:  Goal status: INITIAL  2.  Patient will improve FOTO score by at least 11 points in order to indicate improved tolerance to activity. Baseline: 56% function 12/06/22: 54% function Goal status: INITIAL  3.  Patient will demonstrate at least 25% improvement in cervical ROM in all restricted planes for improved ability to move head while driving. Baseline: see above Goal status: INITIAL  4.  Patient will be able to return to all activities unrestricted for improved ability to perform work functions and participate with family.  Baseline:  Goal status: INITIAL  5.  Patient will demonstrate grade of 5/5 MMT grade in all tested musculature as evidence of improved strength to assist with lifting at home. Baseline: see above Goal status: INITIAL      PLAN:  PT FREQUENCY: 1-2x/week  PT DURATION: 6 weeks  PLANNED INTERVENTIONS: Therapeutic exercises, Therapeutic activity, Neuromuscular re-education, Balance training, Gait training, Patient/Family education, Joint manipulation, Joint mobilization, Stair training, Orthotic/Fit training, DME instructions, Aquatic Therapy, Dry Needling, Electrical stimulation, Spinal manipulation, Spinal mobilization, Cryotherapy, Moist heat, Compression  bandaging, scar mobilization, Splintting, Taping, Traction, Ultrasound, Ionotophoresis 4mg /ml Dexamethasone, and Manual therapy  PLAN FOR NEXT SESSION: f/u with HEP; possibly DN, postural strength, cervical mobility     Wyman Songster, PT 01/09/2023, 1:54 PM

## 2023-01-10 ENCOUNTER — Ambulatory Visit (INDEPENDENT_AMBULATORY_CARE_PROVIDER_SITE_OTHER): Payer: Medicare Other | Admitting: Orthopaedic Surgery

## 2023-01-10 ENCOUNTER — Other Ambulatory Visit (INDEPENDENT_AMBULATORY_CARE_PROVIDER_SITE_OTHER): Payer: Medicare Other

## 2023-01-10 DIAGNOSIS — M5442 Lumbago with sciatica, left side: Secondary | ICD-10-CM

## 2023-01-10 DIAGNOSIS — G8929 Other chronic pain: Secondary | ICD-10-CM | POA: Diagnosis not present

## 2023-01-10 MED ORDER — TIZANIDINE HCL 2 MG PO TABS: 2.0000 mg | ORAL_TABLET | Freq: Three times a day (TID) | ORAL | 1 refills | Status: DC | PRN

## 2023-01-10 NOTE — Progress Notes (Signed)
The patient is a 72 year old gentleman well-known to [place].  He reports about 3-week history of left-sided low back pain and a little bit of radicular symptoms as well.  He is actually in physical therapy now and they are working on this area.  He does report a lot of bruising but he is now on Plavix and Eliquis.  He has had a recent stent and cannot come off of these medications for any type of procedure in the near future.  He does have a old MRI of his lumbar spine showing significant stenosis at L2-L3 and moderate stenosis at L5-S1.  This MRI was performed over 3 years ago.  At that time he did have epidural steroid injections.  However he cannot have any type of intervention now.  He is also diabetic so he cannot have a steroid.  He is on 100 mg of gabapentin twice during the day and he takes 200 mg at night.  He reports good blood glucose control.  On exam he has negative straight leg raise on the left side.  He has good strength throughout his bilateral lower extremities.  He does have a BKA on the right side.  He denies any change in bowel or bladder function.  X-rays of the lumbar spine show chronic degenerative changes at multiple levels.  I have encouraged him to increase his Neurontin to 300 mg at bedtime and we will try Zanaflex as a muscle relaxant to see if this will help.  Also continue therapy.  There is really nothing else that we can offer right now given the situation with his blood thinners in terms of not being able to come off of those medications for any type of epidural steroid injection.  If things worsen he will let us know.  He also agrees with this treatment plan and does not appear significantly uncomfortable at all.

## 2023-01-11 ENCOUNTER — Ambulatory Visit (INDEPENDENT_AMBULATORY_CARE_PROVIDER_SITE_OTHER): Payer: Medicare Other | Admitting: Orthopedic Surgery

## 2023-01-11 DIAGNOSIS — L97521 Non-pressure chronic ulcer of other part of left foot limited to breakdown of skin: Secondary | ICD-10-CM | POA: Diagnosis not present

## 2023-01-11 DIAGNOSIS — Z89432 Acquired absence of left foot: Secondary | ICD-10-CM

## 2023-01-12 ENCOUNTER — Ambulatory Visit (HOSPITAL_BASED_OUTPATIENT_CLINIC_OR_DEPARTMENT_OTHER): Payer: Medicare Other | Admitting: Physical Therapy

## 2023-01-12 ENCOUNTER — Encounter (HOSPITAL_BASED_OUTPATIENT_CLINIC_OR_DEPARTMENT_OTHER): Payer: Self-pay | Admitting: Physical Therapy

## 2023-01-12 DIAGNOSIS — M542 Cervicalgia: Secondary | ICD-10-CM

## 2023-01-12 DIAGNOSIS — R29898 Other symptoms and signs involving the musculoskeletal system: Secondary | ICD-10-CM | POA: Diagnosis not present

## 2023-01-12 NOTE — Therapy (Signed)
OUTPATIENT PHYSICAL THERAPY CERVICAL Treatment   Patient Name: [name] MRN: [id number] DOB:December 22, 1950, 72 y.o., male Today's Date: 01/12/2023  PHYSICAL THERAPY DISCHARGE SUMMARY  Visits from Start of Care: 16  Current functional level related to goals / functional outcomes: See below   Remaining deficits: See below   Education / Equipment: See below   Patient agrees to discharge. Patient goals were met. Patient is being discharged due to being pleased with the current functional level.   END OF SESSION:  PT End of Session - 01/12/23 1304     Visit Number 16    Number of Visits 24    Date for PT Re-Evaluation 01/17/23    Authorization Type Medicare    PT Start Time 1302    PT Stop Time 1340    PT Time Calculation (min) 38 min    Activity Tolerance Patient tolerated treatment well    Behavior During Therapy WFL for tasks assessed/performed                Past Medical History:  Diagnosis Date   Anemia    low iron   Arthritis    Coronary artery disease 2006   a. remote stenting to ramus, prox LAD x2.   Dental crowns present    Diabetes mellitus    First degree AV block    GERD (gastroesophageal reflux disease)    Heart attack (HCC) 06/2004   Heart murmur    aortic   Hx MRSA infection 02/2006   Hyperlipidemia    Hypertension    hx. of - has not been on med. since losing wt. after gastric bypass   Hypothyroidism    Morbid obesity (HCC)    Mucoid cyst of joint 09/2011   left thumb   Sleep apnea    sleep study 03/29/2011; no CPAP use, lost >130lbs   Trifascicular block    Past Surgical History:  Procedure Laterality Date   AMPUTATION Left 04/19/2016   Procedure: Left Foot 4th Toe Amputation vs. Transmetatarsal;  Surgeon: Nadara Mustard, MD;  [facility];  Service: Orthopedics;  Laterality: Left;   BIOPSY  04/07/2019   Procedure: BIOPSY;  Surgeon: Kathi Der, MD;  [facility]: [facility];  Service: Gastroenterology;;   CATARACT EXTRACTION   02/2010; 03/2010   COLONOSCOPY WITH PROPOFOL N/A 09/14/2014   Procedure: COLONOSCOPY WITH PROPOFOL;  Surgeon: Charolett Bumpers, MD;  [facility];  Service: Endoscopy;  Laterality: N/A;   COLONOSCOPY WITH PROPOFOL N/A 04/07/2019   Procedure: COLONOSCOPY WITH PROPOFOL;  Surgeon: Kathi Der, MD;  [facility];  Service: Gastroenterology;  Laterality: N/A;   CORONARY ANGIOPLASTY WITH STENT PLACEMENT  07/12/2004; 08/03/2004   total of 3 stents   CORONARY STENT INTERVENTION N/A 08/23/2022   Procedure: CORONARY STENT INTERVENTION;  Surgeon: Swaziland, Peter M, MD;  Location: [facility] INVASIVE CV LAB;  Service: Cardiovascular;  Laterality: N/A;   I & D EXTREMITY Right 06/04/2014   Procedure: IRRIGATION AND DEBRIDEMENT EXTREMITY;  Surgeon: Kathryne Hitch, MD;  Location: [facility] OR;  Service: Orthopedics;  Laterality: Right;   LEFT HEART CATH AND CORONARY ANGIOGRAPHY N/A 08/23/2022   Procedure: LEFT HEART CATH AND CORONARY ANGIOGRAPHY;  Surgeon: Swaziland, Peter M, MD;  Location: [facility] INVASIVE CV LAB;  Service: Cardiovascular;  Laterality: N/A;   MASS EXCISION  10/04/2011   Procedure: EXCISION MASS;  Surgeon: Nicki Reaper, MD;  Location: [facility];  Service: Orthopedics;  Laterality: Left;  excision cyst debridment ip joint of  left thumb   PROSTATECTOMY     right below knee amputation  2018   ROUX-EN-Y GASTRIC BYPASS  10/10/2010   laparoscopic   SHOULDER ARTHROSCOPY WITH ROTATOR CUFF REPAIR AND SUBACROMIAL DECOMPRESSION Right 03/23/2020   Procedure: RIGHT SHOULDER ARTHROSCOPY WITH DEBRIDEMENT AND  ROTATOR CUFF REPAIR;  Surgeon: Kathryne Hitch, MD;  [facility];  Service: Orthopedics;  Laterality: Right;   TOE AMPUTATION  02/23/2006   left foot second ray amputation   TOTAL HIP ARTHROPLASTY Right 03/29/2020   Procedure: TOTAL HIP ARTHROPLASTY POSTERIOR APPROACH;  Surgeon: Tarry Kos, MD;  [facility]: [facility];  Service: Orthopedics;  Laterality: Right;   TRANSMETATARSAL  AMPUTATION Left    TRIGGER FINGER RELEASE Right 09/16/2019   Procedure: RELEASE TRIGGER FINGER/A-1 PULLEY;  Surgeon: Cindee Salt, MD;  Location: [facility];  Service: Orthopedics;  Laterality: Right;  IV REGIONAL FOREARM BLOCK   Patient Active Problem List   Diagnosis Date Noted   Paroxysmal atrial fibrillation with RVR (HCC) 08/23/2022   Ascending aortic aneurysm (HCC) 08/23/2022   Bradycardia 08/22/2022   NSTEMI (non-ST elevated myocardial infarction) (HCC) 08/22/2022   Atrial fibrillation with RVR (HCC) 08/22/2022   Hypotension 01/07/2021   S/P arthroscopy of right shoulder 06/02/2020   Closed fracture of neck of right femur (HCC)    Hip fracture (HCC) 03/27/2020   Nontraumatic complete tear of right rotator cuff 02/26/2020   S/P transmetatarsal amputation of foot, left (HCC) 04/25/2016   Chronic osteomyelitis of toe, left (HCC)    Chronic diastolic CHF (congestive heart failure) (HCC) 09/25/2015   Aortic stenosis 08/04/2014   Diabetic foot infection (HCC) 06/03/2014   Peripheral vascular disease (HCC) 06/03/2014   Coronary artery disease involving native coronary artery of native heart without angina pectoris 07/29/2013   Trifascicular block 07/29/2013   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 07/26/2011   Hypothyroidism 01/22/2007   Type 2 diabetes mellitus with vascular disease (HCC) 01/22/2007   HYPERLIPIDEMIA, MIXED 01/22/2007   MYOCARDIAL INFARCTION, HX OF 01/22/2007   ANEMIA, IRON DEFICIENCY, HX OF 01/22/2007    PCP: Hillard Danker MD  REFERRING PROVIDER: Kirtland Bouchard, PA-C  REFERRING DIAG: M54.2 (ICD-10-CM) - Cervicalgia  THERAPY DIAG:  Cervicalgia  Other symptoms and signs involving the musculoskeletal system  Rationale for Evaluation and Treatment: Rehabilitation  ONSET DATE: 2.5-3 months  SUBJECTIVE:  SUBJECTIVE STATEMENT: The patient reports spent some time getting his prosthetic fixed this morning. Got a twinge a couple times. Bruised in his back. Patient states he overall is feeling better, not as painful when it does bother him. Posture is improving. Does not feel as it is ever going to go away. HEP going well. Patient states 90% improvement in symptoms, remaining deficit is twinge in symptoms with L rotation. It is not constant. Symptoms once every few days.    PERTINENT HISTORY:  Afib, hx NSTEMI, hx transmetatarsal amputation L, R BKA, CHF, PVD, HLD, Ascending aortic aneurysm, DM  PAIN:  Are you having pain? Yes: NPRS scale: 3 intermittent/10 Pain location: L cervical spine Pain description: sharp Aggravating factors: L rotation Relieving factors: rest  PRECAUTIONS: None  WEIGHT BEARING RESTRICTIONS: No  FALLS:  Has patient fallen in last 6 months? No  PLOF: Independent  PATIENT GOALS: turn to left and not have pain   OBJECTIVE: (objective measures from initial evaluation unless otherwise dated)  DIAGNOSTIC FINDINGS:  XR 09/14/22: Cervical spine 3 views: Shows normal lordotic curvature.  Disc base overall well-maintained.  No acute fractures no spondylolisthesis.  Minimal arthritic changes.   PATIENT SURVEYS:  FOTO 56% function  12/06/22: 54 % function 01/12/23:  72% function  COGNITION: Overall cognitive status: Within functional limits for tasks assessed  SENSATION: WFL  POSTURE: rounded shoulders, forward head, and increased thoracic kyphosis  PALPATION: TTP bilateral UT, concordant on L, minimal to no tenderness throughout rest of cervical spine; hypomobile and lordotic c/sp; 01/12/23: hyperactive L UT but improved  CERVICAL ROM:   Active ROM A/PROM (deg) eval AROM 11/2 Pre//post DN 12/06/22 01/12/23  Flexion 27  35 43  Extension 40  36 40  Right lateral flexion 30 * 24//30 21 26   Left lateral  flexion 23 * 14*//30 13 30   Right rotation 58 70//70 79 68  Left rotation 59 * 60*//64 72 67   (Blank rows = not tested) *=pain/symptoms  UPPER EXTREMITY ROM: WFL for tasks assessed  Active ROM Right eval Left eval  Shoulder flexion    Shoulder extension    Shoulder abduction    Shoulder adduction    Shoulder extension    Shoulder internal rotation    Shoulder external rotation    Elbow flexion    Elbow extension    Wrist flexion    Wrist extension    Wrist ulnar deviation    Wrist radial deviation    Wrist pronation    Wrist supination     (Blank rows = not tested) *=pain/symptoms  UPPER EXTREMITY MMT:  MMT Right eval Left eval Right 12/06/22 Left 12/06/22 Right 01/12/23 Left 01/12/23  Shoulder flexion 4+ 4+ 4+ 4+ 5 5  Shoulder extension        Shoulder abduction 4+ 4+ 4+ 4+ 5 5  Shoulder adduction        Shoulder extension        Shoulder internal rotation        Shoulder external rotation        Middle trapezius        Lower trapezius        Elbow flexion 5 5      Elbow extension 5 5      Wrist flexion        Wrist extension        Wrist ulnar deviation        Wrist radial deviation        Wrist  pronation        Wrist supination        Grip strength         (Blank rows = not tested) *=pain/symptoms  CERVICAL SPECIAL TESTS:  Distraction test: Negative    TODAY'S TREATMENT:                                                                                                                              01/12/23 Reassessment  Education   01/09/23 UBE 2/2 retro/forward Manual: STM to L UT, gross T/sp PA glides Row 20# 3 x 12 Shoulder extension 20# 3 x 12 Lat pull down 40# 2 x 15   01/05/23 UBE 2/2 retro/forward Manual: STM to L UT, gross T/sp PA glides Row 15# 3 x 12 Shoulder extension 15# 3 x 12 Lat pull down 40# 2 x 10  12/11  Manual: reviewed  self trigger point release. Skilled palpation of trigger points. Sub-occipital release. Gentle  manual traction. Thoracic PA gross mobilization   Row 3x12 15 lbs  Shoulder extension 3x12 15 lbs       12/29/22 Manual: STM to L lumbar paraspinals and L glutes  Open book 10 x 5 second holds bilateral  Standing hip abduction 2 x 10  11/22  Manual: reviewed use of thera-cane for self trigger point release. Skilled palpation of trigger points. Sub-occipital release. Gentle manual traction. Thoracic PA gross mobilization      12/08/22 TTP and hyperactive L UT Manual: STM to L UT Seated L rotation SNAG 2 x 10 Seated extension SNAG 2 x 10 Standing bilateral shoulder ER BTB 2 x 10 Standing shoulder horizontal abduction BTB 2 x 10  Standing shoulder PNF D2 GTB 2 x 10   12/06/22 Reassessment TTP and hyperactive L UT Seated L rotation SNAG 2 x 10 Standing row BTB 3 x 10  12/01/22 Manual: STM to L UT Seated cervical retractions 2 x 10  Seated L rotation SNAG 2 x 10 Standing row BTB 3 x 10   11/29/22 Manual: STM to L UT pre and post dry needling for trigger point identification and muscular relaxation.; manual UT stretch  Trigger Point Dry-Needling  Treatment instructions: Expect mild to moderate muscle soreness. S/S of pneumothorax if dry needled over a lung field, and to seek immediate medical attention should they occur. Patient verbalized understanding of these instructions and education.  Patient Consent Given: Yes Education handout provided: Previously provided Muscles treated: L UT Electrical stimulation performed: No Parameters: N/A Treatment response/outcome: twitch response, decrease in tissue tension, decrease in symptoms.  Seated cervical retractions 1 x 10      HOME EXERCISE PROGRAM: Access Code: [email or web address] URL: [email or web address]    ASSESSMENT:  CLINICAL IMPRESSION: Patient has met 2/2 short term goals and 4/5 long term goals with ability to complete HEP and improvement in symptoms, strength, ROM, activity tolerance, and functional  mobility. Remaining goals not met due to continued deficits in posture,  ROM. Patient has made good progress toward remaining goals and overall only has intermittent symptoms every few days at this point. Discussed progress made and educated on returning to PT if needed and continuing HEP. Patient feels ready to transition to HEP. Patient discharged from PT at this time.    OBJECTIVE IMPAIRMENTS: decreased activity tolerance, decreased endurance, decreased mobility, decreased ROM, decreased strength, hypomobility, increased muscle spasms, impaired flexibility, impaired UE functional use, improper body mechanics, postural dysfunction, and pain.   ACTIVITY LIMITATIONS: carrying, lifting, bending, reach over head, and caring for others  PARTICIPATION LIMITATIONS: meal prep, cleaning, laundry, driving, shopping, community activity, and yard work  PERSONAL FACTORS: 3+ comorbidities: Afib, hx NSTEMI, hx transmetatarsal, amputation L, CHF, PVD, HLD, Ascending aortic aneurysm, DM  are also affecting patient's functional outcome.   REHAB POTENTIAL: Good  CLINICAL DECISION MAKING: Stable/uncomplicated  EVALUATION COMPLEXITY: Low   GOALS: Goals reviewed with patient? Yes  SHORT TERM GOALS: Target date: 11/15/2022    Patient will be independent with HEP in order to improve functional outcomes. Baseline: Goal status: MET  2.  Patient will report at least 25% improvement in symptoms for improved quality of life. Baseline:  Goal status: MET    LONG TERM GOALS: Target date: 12/06/2022    Patient will report at least 75% improvement in symptoms for improved quality of life. Baseline:  Goal status: MET  2.  Patient will improve FOTO score by at least 11 points in order to indicate improved tolerance to activity. Baseline: 56% function 12/06/22: 54% function 01/12/23: 72 % function Goal status: MET  3.  Patient will demonstrate at least 25% improvement in cervical ROM in all restricted  planes for improved ability to move head while driving. Baseline: see above Goal status: NOT MET  4.  Patient will be able to return to all activities unrestricted for improved ability to perform work functions and participate with family.  Baseline:  Goal status: MET  5.  Patient will demonstrate grade of 5/5 MMT grade in all tested musculature as evidence of improved strength to assist with lifting at home. Baseline: see above Goal status: MET      PLAN:  PT FREQUENCY: 1-2x/week  PT DURATION: 6 weeks  PLANNED INTERVENTIONS: Therapeutic exercises, Therapeutic activity, Neuromuscular re-education, Balance training, Gait training, Patient/Family education, Joint manipulation, Joint mobilization, Stair training, Orthotic/Fit training, DME instructions, Aquatic Therapy, Dry Needling, Electrical stimulation, Spinal manipulation, Spinal mobilization, Cryotherapy, Moist heat, Compression bandaging, scar mobilization, Splintting, Taping, Traction, Ultrasound, Ionotophoresis 4mg /ml Dexamethasone, and Manual therapy  PLAN FOR NEXT SESSION: f/u with HEP; possibly DN, postural strength, cervical mobility     Wyman Songster, PT 01/12/2023, 1:39 PM

## 2023-01-14 ENCOUNTER — Encounter: Payer: Self-pay | Admitting: Orthopedic Surgery

## 2023-01-14 NOTE — Progress Notes (Signed)
Office Visit Note   Patient: [name]           Date of Birth: [birth year]           MRN: [id number] Visit Date: 01/11/2023              Requested by: Thana Ates, MD [address] [place],  [place] [address] PCP: Thana Ates, MD  Chief Complaint  Patient presents with   Left Foot - Follow-up      HPI: Patient is a 72 year old gentleman left transmetatarsal amputation with a chronic plantar ulcer.  He has custom orthotics and a spacer.  Right transtibial amputation.  Assessment & Plan: Visit Diagnoses:  1. History of transmetatarsal amputation of left foot (HCC)   2. Non-pressure chronic ulcer of other part of left foot limited to breakdown of skin (HCC)     Plan: Callus was pared on the left foot.  Continue with custom orthotics.  Follow-Up Instructions: Return in about 4 weeks (around 02/08/2023).   Ortho Exam  Patient is alert, oriented, no adenopathy, well-dressed, normal affect, normal respiratory effort. Examination the ulcer is essentially healed the calluses pared.  Imaging: No results found. No images are attached to the encounter.  Labs: Lab Results  Component Value Date   HGBA1C 6.7 (H) 08/24/2022   HGBA1C 6.8 (H) 03/28/2020   HGBA1C 6.5 (H) 09/26/2015   REPTSTATUS 08/01/2016 FINAL 07/30/2016   GRAMSTAIN  06/04/2014    MODERATE WBC PRESENT, PREDOMINANTLY PMN NO SQUAMOUS EPITHELIAL CELLS SEEN MODERATE GRAM POSITIVE COCCI IN CLUSTERS Performed at [facility]    GRAMSTAIN  06/04/2014    MODERATE WBC PRESENT, PREDOMINANTLY PMN NO SQUAMOUS EPITHELIAL CELLS SEEN MODERATE GRAM POSITIVE COCCI IN PAIRS Performed at [facility]    CULT  07/30/2016    NO GROWTH Performed at [facility], [address] [place], [place] [address]      Lab Results  Component Value Date   ALBUMIN 3.2 (L) 08/24/2022   ALBUMIN 3.4 (L) 08/23/2022   ALBUMIN 4.1 08/22/2022    Lab Results  Component Value Date   MG 1.8  08/22/2022   MG 1.8 08/08/2021   MG 2.0 04/02/2020   Lab Results  Component Value Date   VD25OH 53.03 03/28/2020    No results found for: "PREALBUMIN"    Latest Ref Rng & Units 08/24/2022   12:58 AM 08/23/2022    3:49 AM 08/22/2022    3:57 PM  CBC EXTENDED  WBC 4.0 - 10.5 K/uL 6.4  5.3  9.5   RBC 4.22 - 5.81 MIL/uL 4.15  4.05  4.85   Hemoglobin 13.0 - 17.0 g/dL 52.8  41.3  24.4   HCT 39.0 - 52.0 % 37.5  38.0  43.8   Platelets 150 - 400 K/uL 187  163  236      There is no height or weight on file to calculate BMI.  Orders:  No orders of the defined types were placed in this encounter.  No orders of the defined types were placed in this encounter.    Procedures: No procedures performed  Clinical Data: No additional findings.  ROS:  All other systems negative, except as noted in the HPI. Review of Systems  Objective: Vital Signs: There were no vitals taken for this visit.  Specialty Comments:  No specialty comments available.  PMFS History: Patient Active Problem List   Diagnosis Date Noted   Paroxysmal atrial fibrillation  with RVR (HCC) 08/23/2022   Ascending aortic aneurysm (HCC) 08/23/2022   Bradycardia 08/22/2022   NSTEMI (non-ST elevated myocardial infarction) (HCC) 08/22/2022   Atrial fibrillation with RVR (HCC) 08/22/2022   Hypotension 01/07/2021   S/P arthroscopy of right shoulder 06/02/2020   Closed fracture of neck of right femur (HCC)    Hip fracture (HCC) 03/27/2020   Nontraumatic complete tear of right rotator cuff 02/26/2020   S/P transmetatarsal amputation of foot, left (HCC) 04/25/2016   Chronic osteomyelitis of toe, left (HCC)    Chronic diastolic CHF (congestive heart failure) (HCC) 09/25/2015   Aortic stenosis 08/04/2014   Diabetic foot infection (HCC) 06/03/2014   Peripheral vascular disease (HCC) 06/03/2014   Coronary artery disease involving native coronary artery of native heart without angina pectoris 07/29/2013   Trifascicular  block 07/29/2013   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 remote stenting to ramus, prox LAD x2.   Dental crowns present    Diabetes mellitus    First degree AV block    GERD (gastroesophageal reflux disease)    Heart attack (HCC) 06/2004   Heart murmur    aortic   Hx MRSA infection 02/2006   Hyperlipidemia    Hypertension    hx. of - has not been on med. since losing wt. after gastric bypass   Hypothyroidism    Morbid obesity (HCC)    Mucoid cyst of joint 09/2011   left thumb   Sleep apnea    sleep study 03/29/2011; no CPAP use, lost >130lbs   Trifascicular block     Family History  Problem Relation Age of Onset   Heart disease Mother     Past Surgical History:  Procedure Laterality Date   AMPUTATION Left 04/19/2016   Procedure: Left Foot 4th Toe Amputation vs. Transmetatarsal;  Surgeon: Nadara Mustard, MD;  [facility];  Service: Orthopedics;  Laterality: Left;   BIOPSY  04/07/2019   Procedure: BIOPSY;  Surgeon: Kathi Der, MD;  [facility];  Service: Gastroenterology;;   CATARACT EXTRACTION  02/2010; 03/2010   COLONOSCOPY WITH PROPOFOL N/A 09/14/2014   Procedure: COLONOSCOPY WITH PROPOFOL;  Surgeon: Charolett Bumpers, MD;  [facility];  Service: Endoscopy;  Laterality: N/A;   COLONOSCOPY WITH PROPOFOL N/A 04/07/2019   Procedure: COLONOSCOPY WITH PROPOFOL;  Surgeon: Kathi Der, MD;  Location[facility];  Service: Gastroenterology;  Laterality: N/A;   CORONARY ANGIOPLASTY WITH STENT PLACEMENT  07/12/2004; 08/03/2004   total of 3 stents   CORONARY STENT INTERVENTION N/A 08/23/2022   Procedure: CORONARY STENT  INTERVENTION;  Surgeon: Swaziland, Peter M, MD;  Location: [facility] INVASIVE CV LAB;  Service: Cardiovascular;  Laterality: N/A;   I & D EXTREMITY Right 06/04/2014   Procedure: IRRIGATION AND DEBRIDEMENT EXTREMITY;  Surgeon: Kathryne Hitch, MD;  Location: [facility] OR;  Service: Orthopedics;  Laterality: Right;   LEFT HEART CATH AND CORONARY ANGIOGRAPHY N/A 08/23/2022   Procedure: LEFT HEART CATH AND CORONARY ANGIOGRAPHY;  Surgeon: Swaziland, Peter M, MD;  Location: [facility] INVASIVE CV LAB;  Service: Cardiovascular;  Laterality: N/A;   MASS EXCISION  10/04/2011  Procedure: EXCISION MASS;  Surgeon: Nicki Reaper, MD;  Location: [facility];  Service: Orthopedics;  Laterality: Left;  excision cyst debridment ip joint of left thumb   PROSTATECTOMY     right below knee amputation  2018   ROUX-EN-Y GASTRIC BYPASS  10/10/2010   laparoscopic   SHOULDER ARTHROSCOPY WITH ROTATOR CUFF REPAIR AND SUBACROMIAL DECOMPRESSION Right 03/23/2020   Procedure: RIGHT SHOULDER ARTHROSCOPY WITH DEBRIDEMENT AND  ROTATOR CUFF REPAIR;  Surgeon: Kathryne Hitch, MD;  Location[facility];  Service: Orthopedics;  Laterality: Right;   TOE AMPUTATION  02/23/2006   left foot second ray amputation   TOTAL HIP ARTHROPLASTY Right 03/29/2020   Procedure: TOTAL HIP ARTHROPLASTY POSTERIOR APPROACH;  Surgeon: Tarry Kos, MD;  Location: [facility];  Service: Orthopedics;  Laterality: Right;   TRANSMETATARSAL AMPUTATION Left    TRIGGER FINGER RELEASE Right 09/16/2019   Procedure: RELEASE TRIGGER FINGER/A-1 PULLEY;  Surgeon: Cindee Salt, MD;  Location: [facility];  Service: Orthopedics;  Laterality: Right;  IV REGIONAL FOREARM BLOCK   Social History   Occupational History   Occupation: IT trainer taxes  Tobacco Use   Smoking status: Never   Smokeless tobacco: Never  Substance and Sexual Activity   Alcohol use: No   Drug use: No   Sexual activity: Yes

## 2023-01-15 ENCOUNTER — Encounter: Payer: Self-pay | Admitting: Cardiology

## 2023-01-19 ENCOUNTER — Encounter (HOSPITAL_BASED_OUTPATIENT_CLINIC_OR_DEPARTMENT_OTHER): Payer: Medicare Other | Admitting: Physical Therapy

## 2023-01-29 ENCOUNTER — Ambulatory Visit: Payer: Medicare Other | Admitting: Orthopaedic Surgery

## 2023-02-05 ENCOUNTER — Telehealth: Payer: Self-pay | Admitting: Orthopedic Surgery

## 2023-02-05 NOTE — Telephone Encounter (Signed)
 Pt called requesting a script of amoxicillian. Pt has an upcoming dental appt. Please send to [facility] [address]. Pt phone number is [phone].

## 2023-02-05 NOTE — Telephone Encounter (Signed)
 Joint replacement in March of 2022. Dr.Xu recommends for 2 years post op.

## 2023-02-05 NOTE — Telephone Encounter (Signed)
 Pt had total hip with Roda Shutters 2022 please see below.

## 2023-02-05 NOTE — Telephone Encounter (Signed)
 Notified patient via voicemail that he does not need premed anymore.

## 2023-02-06 ENCOUNTER — Ambulatory Visit
Admission: RE | Admit: 2023-02-06 | Discharge: 2023-02-06 | Disposition: A | Discharge status: Home / Self Care | Payer: Medicare Other | Source: Ambulatory Visit | Attending: Thoracic Surgery (Cardiothoracic Vascular Surgery) | Admitting: Thoracic Surgery (Cardiothoracic Vascular Surgery)

## 2023-02-06 DIAGNOSIS — I7121 Aneurysm of the ascending aorta, without rupture: Secondary | ICD-10-CM | POA: Diagnosis not present

## 2023-02-06 MED ORDER — IOPAMIDOL (ISOVUE-370) INJECTION 76%
500.0000 mL | Freq: Once | INTRAVENOUS | Status: AC | PRN
  Administered 2023-02-06: 75 mL via INTRAVENOUS

## 2023-02-08 ENCOUNTER — Ambulatory Visit (INDEPENDENT_AMBULATORY_CARE_PROVIDER_SITE_OTHER): Payer: Medicare Other | Admitting: Orthopedic Surgery

## 2023-02-08 DIAGNOSIS — Z89432 Acquired absence of left foot: Secondary | ICD-10-CM

## 2023-02-13 ENCOUNTER — Ambulatory Visit (INDEPENDENT_AMBULATORY_CARE_PROVIDER_SITE_OTHER): Payer: Medicare Other | Admitting: Thoracic Surgery (Cardiothoracic Vascular Surgery)

## 2023-02-13 ENCOUNTER — Encounter: Payer: Self-pay | Admitting: Thoracic Surgery (Cardiothoracic Vascular Surgery)

## 2023-02-13 VITALS — BP 163/75 | HR 70 | Resp 20 | Ht >= 80 in | Wt 223.0 lb

## 2023-02-13 DIAGNOSIS — I7121 Aneurysm of the ascending aorta, without rupture: Secondary | ICD-10-CM | POA: Diagnosis not present

## 2023-02-13 NOTE — Progress Notes (Signed)
[address]       [name] [address]             [phone]       HPI: [name] returns for follow-up of his ascending aneurysm.  [name] is a 73 year old man with a history of CAD, MI, stents, aortic sclerosis, ascending aortic aneurysm, first-degree AV block, trifascicular block, hypertension, hyperlipidemia, type 2 diabetes, morbid obesity, gastric bypass, arthritis, sleep apnea, and hypothyroidism.  In 2023 he had an echocardiogram which showed a 4.3 cm aortic root.  He had a CT angiogram a year later which showed a 5 cm aneurysm of the distal aorta and proximal arch.  His most recent echocardiogram in July 2024 showed aortic sclerosis but no significant stenosis.  I saw him in July.  He was in A-fib with RVR initially.  He was sent to the ED and admitted to the hospital.  He ruled in for non-STEMI and underwent catheterization and stenting.  He has been feeling well since then.  Denies any chest pain, pressure, tightness, or shortness of breath.  Has been monitoring his blood pressure at home.  Typically runs in the 130 systolic range but has had a few readings in the mid 140s.  Past Medical History:  Diagnosis Date   Anemia    low iron   Arthritis    Coronary artery disease 2006   a. remote stenting to ramus, prox LAD x2.   Dental crowns present    Diabetes mellitus    First degree AV block    GERD (gastroesophageal reflux disease)    Heart attack (HCC) 06/2004   Heart murmur    aortic   Hx MRSA infection 02/2006   Hyperlipidemia    Hypertension    hx. of - has not been on med. since losing wt. after gastric bypass   Hypothyroidism    Morbid obesity (HCC)    Mucoid cyst of joint 09/2011   left thumb   Sleep apnea    sleep study 03/29/2011; no CPAP use, lost >130lbs   Trifascicular block     Current Outpatient Medications  Medication Sig Dispense Refill   apixaban (ELIQUIS) 5 MG TABS tablet Take 1 tablet (5 mg total) by mouth 2 (two) times  daily. 180 tablet 3   atorvastatin (LIPITOR) 40 MG tablet Take 1 tablet (40 mg total) by mouth at bedtime. 30 tablet 2   Calcium Citrate-Vitamin D (CALCIUM CITRATE CHEWY BITE PO) Take 500 mg by mouth 3 (three) times daily.     clopidogrel (PLAVIX) 75 MG tablet Take 1 tablet (75 mg total) by mouth daily with breakfast. 90 tablet 3   cyclobenzaprine (FLEXERIL) 10 MG tablet Take 1 tablet (10 mg total) by mouth at bedtime. 30 tablet 0   famotidine (PEPCID) 40 MG tablet Take 40 mg by mouth at bedtime as needed.     ferrous sulfate 325 (65 FE) MG tablet Take 325 mg by mouth See admin instructions. Take one tablet by mouth on Monday, Wednesday and Fridays     fluticasone (FLONASE) 50 MCG/ACT nasal spray Place 2 sprays into both nostrils daily as needed for allergies.      gabapentin (NEURONTIN) 100 MG capsule TAKE 1 CAPSULE (100 MG TOTAL) BY MOUTH 4 (FOUR) TIMES DAILY. WHEN NECESSARY FOR NEUROPATHY PAIN 120 capsule 3   Hypromellose (GENTEAL SEVERE OP) Place 1 drop into the right eye daily as needed (Dry eyes).     ketoconazole (NIZORAL)  2 % cream Apply 1 Application topically daily as needed for rash.     levothyroxine (SYNTHROID) 125 MCG tablet Take 1 tablet (125 mcg total) by mouth daily at 6 (six) AM. 90 tablet 3   metFORMIN (GLUCOPHAGE-XR) 500 MG 24 hr tablet Take 1,000 mg by mouth 2 (two) times daily.     Multiple Vitamin (MULTIVITAMIN WITH MINERALS) TABS tablet Take 1 tablet by mouth in the morning and at bedtime. Gummies (Patient taking differently: Take 2 tablets by mouth in the morning and at bedtime. Gummies)     Multiple Vitamins-Minerals (PRESERVISION AREDS 2) CAPS Take 1 capsule by mouth daily.     nitroGLYCERIN (NITROSTAT) 0.4 MG SL tablet Place 1 tablet (0.4 mg total) under the tongue every 5 (five) minutes x 3 doses as needed for chest pain. 25 tablet 3   Omega 3 1000 MG CAPS Take 1,000 mg by mouth daily.     tadalafil (CIALIS) 10 MG tablet Take 5 mg by mouth daily as needed for erectile  dysfunction. Pt takes 1-2 tablets as needed.     tiZANidine (ZANAFLEX) 2 MG tablet Take 1 tablet (2 mg total) by mouth every 8 (eight) hours as needed for muscle spasms. 60 tablet 1   Valerian Root 450 MG CAPS Take 450 mg by mouth at bedtime.     vitamin B-12 (CYANOCOBALAMIN) 1000 MCG tablet Take 1,000 mcg by mouth 3 (three) times a week. Mon, wed, Friday     amoxicillin (AMOXIL) 500 MG tablet Take 4 tablets by mouth 1 hour prior to dental procedure. (Patient not taking: Reported on 02/13/2023) 4 tablet 2   No current facility-administered medications for this visit.    Physical Exam BP (!) 163/75   Pulse 70   Resp 20   Ht 6\' 8"  (2.032 m)   Wt 223 lb (101.2 kg)   SpO2 97% Comment: RA  BMI 24.108 kg/m  72 year old man in no acute distress Alert and oriented x 3 with no focal deficits Lungs clear bilaterally Kyphoscoliosis Cardiac regular rate and rhythm with a 2/6 systolic murmur  Diagnostic Tests: CT ANGIOGRAPHY CHEST WITH CONTRAST   TECHNIQUE: Multidetector CT imaging of the chest was performed using the standard protocol during bolus administration of intravenous contrast. Multiplanar CT image reconstructions and MIPs were obtained to evaluate the vascular anatomy.   RADIATION DOSE REDUCTION: This exam was performed according to the departmental dose-optimization program which includes automated exposure control, adjustment of the mA and/or kV according to patient size and/or use of iterative reconstruction technique.   CONTRAST:  75mL ISOVUE-370 IOPAMIDOL (ISOVUE-370) INJECTION 76%   COMPARISON:  August 22, 2022.   FINDINGS: Cardiovascular: Grossly stable 5 cm ascending thoracic aortic aneurysm. No dissection is noted. Great vessels are widely patent. Coronary artery calcifications are noted. Normal cardiac size. No pericardial effusion.   Mediastinum/Nodes: No adenopathy is noted. Postsurgical changes are again noted involving distal esophagus. Thyroid gland is not  well visualized.   Lungs/Pleura: No pneumothorax or pleural effusion is noted. Stable 5 mm nodule seen in right middle lobe best seen on image number 103 of series 11.   Upper Abdomen: No acute abnormality.   Musculoskeletal: No chest wall abnormality. No acute or significant osseous findings.   Review of the MIP images confirms the above findings.   IMPRESSION: Grossly stable 5 cm Ascending thoracic aortic aneurysm. Recommend semi-annual imaging followup by CTA or MRA and referral to cardiothoracic surgery if not already obtained. This recommendation follows 2010 ACCF/AHA/AATS/ACR/ASA/SCA/SCAI/SIR/STS/SVM Guidelines for  the Diagnosis and Management of Patients With Thoracic Aortic Disease. Circulation. 2010; 121: V784-O962. Aortic aneurysm NOS (ICD10-I71.9).   Coronary artery calcifications are noted.   Stable 5 mm nodule seen in right middle lobe. Attention to this on follow-up imaging is recommended.   Aortic Atherosclerosis (ICD10-I70.0).     Electronically Signed   By: Lupita Raider M.D.   On: 02/06/2023 12:42   I personally reviewed the CT images.  Stable 5 cm ascending aneurysm.  CAD and aortic atherosclerosis.  5 mm nodule right middle lobe stable.  Impression: Nhat Leibman is a 73 year old man with a history of CAD, MI, stents, aortic sclerosis, ascending aortic aneurysm, first-degree AV block, trifascicular block, hypertension, hyperlipidemia, type 2 diabetes, morbid obesity, gastric bypass, arthritis, sleep apnea, and hypothyroidism.  Ascending aortic aneurysm-stable at 5 cm.  Indication for surgery would be 5.5 cm or greater than 5 mm in 6 months.  Needs a follow-up CT see me annually.  Hypertension-blood pressure significantly elevated today in the 160 systolic.  He does monitor himself at home.  I advised him to continue to monitor himself closely.  If he notices values above 140 he needs adjustment to his medications.  CAD-status post PCI stent in July.  On  Plavix.  Atrial fibrillation -regular rhythm today.  On Eliquis.  Plan: Return in 6 months with CT angiogram chest  I spent over 20 minutes in review of records, images, and in consultation with Mr. Prichard today.  Loreli Slot, MD [facility] [phone]

## 2023-02-16 ENCOUNTER — Encounter: Payer: Self-pay | Admitting: Orthopedic Surgery

## 2023-02-16 NOTE — Progress Notes (Signed)
Office Visit Note   Patient: SELDEN NOTEBOOM           Date of Birth: 1950/04/18           MRN: [id number] Visit Date: 02/08/2023              Requested by: Thana Ates, MD [address] [place],  [place] [address] PCP: Thana Ates, MD  Chief Complaint  Patient presents with   Left Foot - Wound Check    Hx left TMA      HPI: Patient is a 73 year old gentleman who presents status post left transmetatarsal amputation with a previous and bearing ulcer.  Assessment & Plan: Visit Diagnoses:  1. History of transmetatarsal amputation of left foot (HCC)     Plan: Ulcer was debrided patient has a prescription for [employer] for new orthotics and spacer and carbon plate.  Follow-Up Instructions: Return in about 2 months (around 04/08/2023).   Ortho Exam  Patient is alert, oriented, no adenopathy, well-dressed, normal affect, normal respiratory effort. Examination patient's foot continues to improve.  The ulcer is smaller.  After informed consent a 10 blade knife was used to debride the skin and soft tissue back to healthy viable tissue.  It measures 10 mm in diameter 1 mm deep after debridement with healthy viable tissue this does not probe to bone or tendon.  Patient is in a test socket on the right.  Imaging: No results found. No images are attached to the encounter.  Labs: Lab Results  Component Value Date   HGBA1C 6.7 (H) 08/24/2022   HGBA1C 6.8 (H) 03/28/2020   HGBA1C 6.5 (H) 09/26/2015   REPTSTATUS 08/01/2016 FINAL 07/30/2016   GRAMSTAIN  06/04/2014    MODERATE WBC PRESENT, PREDOMINANTLY PMN NO SQUAMOUS EPITHELIAL CELLS SEEN MODERATE GRAM POSITIVE COCCI IN CLUSTERS Performed at [facility]    GRAMSTAIN  06/04/2014    MODERATE WBC PRESENT, PREDOMINANTLY PMN NO SQUAMOUS EPITHELIAL CELLS SEEN MODERATE GRAM POSITIVE COCCI IN PAIRS Performed at [facility]    CULT  07/30/2016    NO GROWTH Performed at [facility], [address], [place], [place] [address]      Lab Results  Component Value Date   ALBUMIN 3.2 (L) 08/24/2022   ALBUMIN 3.4 (L) 08/23/2022   ALBUMIN 4.1 08/22/2022    Lab Results  Component Value Date   MG 1.8 08/22/2022   MG 1.8 08/08/2021   MG 2.0 04/02/2020   Lab Results  Component Value Date   VD25OH 53.03 03/28/2020    No results found for: "PREALBUMIN"    Latest Ref Rng & Units 08/24/2022   12:58 AM 08/23/2022    3:49 AM 08/22/2022    3:57 PM  CBC EXTENDED  WBC 4.0 - 10.5 K/uL 6.4  5.3  9.5   RBC 4.22 - 5.81 MIL/uL 4.15  4.05  4.85   Hemoglobin 13.0 - 17.0 g/dL 82.9  56.2  13.0   HCT 39.0 - 52.0 % 37.5  38.0  43.8   Platelets 150 - 400 K/uL 187  163  236      There is no height or weight on file to calculate BMI.  Orders:  No orders of the defined types were placed in this encounter.  No orders of the defined types were placed in this encounter.    Procedures: No procedures performed  Clinical Data: No additional findings.  ROS:  All other systems negative, except  as noted in the HPI. Review of Systems  Objective: Vital Signs: There were no vitals taken for this visit.  Specialty Comments:  No specialty comments available.  PMFS History: Patient Active Problem List   Diagnosis Date Noted   Paroxysmal atrial fibrillation with RVR (HCC) 08/23/2022   Ascending aortic aneurysm (HCC) 08/23/2022   Bradycardia 08/22/2022   NSTEMI (non-ST elevated myocardial infarction) (HCC) 08/22/2022   Atrial fibrillation with RVR (HCC) 08/22/2022   Hypotension 01/07/2021   S/P arthroscopy of right shoulder 06/02/2020   Closed fracture of neck of right femur (HCC)    Hip fracture (HCC) 03/27/2020   Nontraumatic complete tear of right rotator cuff 02/26/2020   S/P transmetatarsal amputation of foot, left (HCC) 04/25/2016   Chronic osteomyelitis of toe, left (HCC)    Chronic diastolic CHF (congestive heart failure) (HCC) 09/25/2015   Aortic stenosis 08/04/2014   Diabetic  foot infection (HCC) 06/03/2014   Peripheral vascular disease (HCC) 06/03/2014   Coronary artery disease involving native coronary artery of native heart without angina pectoris 07/29/2013   Trifascicular block 07/29/2013   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 remote stenting to ramus, prox LAD x2.   Dental crowns present    Diabetes mellitus    First degree AV block    GERD (gastroesophageal reflux disease)    Heart attack (HCC) 06/2004   Heart murmur    aortic   Hx MRSA infection 02/2006   Hyperlipidemia    Hypertension    hx. of - has not been on med. since losing wt. after gastric bypass   Hypothyroidism    Morbid obesity (HCC)    Mucoid cyst of joint 09/2011   left thumb   Sleep apnea    sleep study 03/29/2011; no CPAP use, lost >130lbs   Trifascicular block     Family History  Problem Relation Age of Onset   Heart disease Mother     Past Surgical History:  Procedure Laterality Date   AMPUTATION Left 04/19/2016   Procedure: Left Foot 4th Toe Amputation vs. Transmetatarsal;  Surgeon: Nadara Mustard, MD;  [facility]: [facility];  Service: Orthopedics;  Laterality: Left;   BIOPSY  04/07/2019   Procedure: BIOPSY;  Surgeon: Kathi Der, MD;  Location: [facility];  Service: Gastroenterology;;   CATARACT EXTRACTION  02/2010; 03/2010   COLONOSCOPY WITH PROPOFOL N/A 09/14/2014   Procedure: COLONOSCOPY WITH PROPOFOL;  Surgeon: Charolett Bumpers, MD;  Location: [facility];  Service: Endoscopy;  Laterality: N/A;   COLONOSCOPY WITH PROPOFOL N/A 04/07/2019   Procedure: COLONOSCOPY WITH PROPOFOL;  Surgeon: Kathi Der, MD;  Location: [facility];   Service: Gastroenterology;  Laterality: N/A;   CORONARY ANGIOPLASTY WITH STENT PLACEMENT  07/12/2004; 08/03/2004   total of 3 stents   CORONARY STENT INTERVENTION N/A 08/23/2022   Procedure: CORONARY STENT INTERVENTION;  Surgeon: Swaziland, Peter M, MD;  Location: [facility] INVASIVE CV LAB;  Service: Cardiovascular;  Laterality: N/A;   I & D EXTREMITY Right 06/04/2014   Procedure: IRRIGATION AND DEBRIDEMENT EXTREMITY;  Surgeon: Kathryne Hitch, MD;  Location: [facility] OR;  Service: Orthopedics;  Laterality: Right;  LEFT HEART CATH AND CORONARY ANGIOGRAPHY N/A 08/23/2022   Procedure: LEFT HEART CATH AND CORONARY ANGIOGRAPHY;  Surgeon: Swaziland, Peter M, MD;  Location: [facility] INVASIVE CV LAB;  Service: Cardiovascular;  Laterality: N/A;   MASS EXCISION  10/04/2011   Procedure: EXCISION MASS;  Surgeon: Nicki Reaper, MD;  Location: [facility];  Service: Orthopedics;  Laterality: Left;  excision cyst debridment ip joint of left thumb   PROSTATECTOMY     right below knee amputation  2018   ROUX-EN-Y GASTRIC BYPASS  10/10/2010   laparoscopic   SHOULDER ARTHROSCOPY WITH ROTATOR CUFF REPAIR AND SUBACROMIAL DECOMPRESSION Right 03/23/2020   Procedure: RIGHT SHOULDER ARTHROSCOPY WITH DEBRIDEMENT AND  ROTATOR CUFF REPAIR;  Surgeon: Kathryne Hitch, MD;  Location: [facility];  Service: Orthopedics;  Laterality: Right;   TOE AMPUTATION  02/23/2006   left foot second ray amputation   TOTAL HIP ARTHROPLASTY Right 03/29/2020   Procedure: TOTAL HIP ARTHROPLASTY POSTERIOR APPROACH;  Surgeon: Tarry Kos, MD;  Location: [facility];  Service: Orthopedics;  Laterality: Right;   TRANSMETATARSAL AMPUTATION Left    TRIGGER FINGER RELEASE Right 09/16/2019   Procedure: RELEASE TRIGGER FINGER/A-1 PULLEY;  Surgeon: Cindee Salt, MD;  Location: [facility];  Service: Orthopedics;  Laterality: Right;  IV REGIONAL FOREARM BLOCK   Social History   Occupational History   Occupation: IT trainer taxes  Tobacco Use   Smoking  status: Never   Smokeless tobacco: Never  Substance and Sexual Activity   Alcohol use: No   Drug use: No   Sexual activity: Yes

## 2023-02-20 ENCOUNTER — Other Ambulatory Visit: Payer: Self-pay | Admitting: Orthopaedic Surgery

## 2023-02-20 ENCOUNTER — Encounter: Payer: Self-pay | Admitting: Orthopaedic Surgery

## 2023-02-20 MED ORDER — METHYLPREDNISOLONE 4 MG PO TABS: ORAL_TABLET | ORAL | 0 refills | Status: DC

## 2023-02-21 ENCOUNTER — Encounter: Payer: Self-pay | Admitting: Cardiology

## 2023-02-21 ENCOUNTER — Other Ambulatory Visit: Payer: Self-pay | Admitting: Family

## 2023-02-22 MED ORDER — LOSARTAN POTASSIUM 25 MG PO TABS: 25.0000 mg | ORAL_TABLET | Freq: Every day | ORAL | 3 refills | Status: DC

## 2023-02-22 NOTE — Addendum Note (Signed)
Addended by: Sharin Grave on: 02/22/2023 09:31 AM   Modules accepted: Orders

## 2023-02-27 ENCOUNTER — Encounter: Payer: Self-pay | Admitting: Orthopedic Surgery

## 2023-03-01 ENCOUNTER — Encounter: Payer: Self-pay | Admitting: Orthopedic Surgery

## 2023-03-12 DIAGNOSIS — R630 Anorexia: Secondary | ICD-10-CM | POA: Diagnosis not present

## 2023-03-12 DIAGNOSIS — L97509 Non-pressure chronic ulcer of other part of unspecified foot with unspecified severity: Secondary | ICD-10-CM | POA: Diagnosis not present

## 2023-03-12 DIAGNOSIS — E1149 Type 2 diabetes mellitus with other diabetic neurological complication: Secondary | ICD-10-CM | POA: Diagnosis not present

## 2023-03-12 DIAGNOSIS — D649 Anemia, unspecified: Secondary | ICD-10-CM | POA: Diagnosis not present

## 2023-03-12 DIAGNOSIS — D6869 Other thrombophilia: Secondary | ICD-10-CM | POA: Diagnosis not present

## 2023-03-12 DIAGNOSIS — I7121 Aneurysm of the ascending aorta, without rupture: Secondary | ICD-10-CM | POA: Diagnosis not present

## 2023-03-12 DIAGNOSIS — N529 Male erectile dysfunction, unspecified: Secondary | ICD-10-CM | POA: Diagnosis not present

## 2023-03-12 DIAGNOSIS — E039 Hypothyroidism, unspecified: Secondary | ICD-10-CM | POA: Diagnosis not present

## 2023-03-12 DIAGNOSIS — I2510 Atherosclerotic heart disease of native coronary artery without angina pectoris: Secondary | ICD-10-CM | POA: Diagnosis not present

## 2023-03-12 DIAGNOSIS — Z79899 Other long term (current) drug therapy: Secondary | ICD-10-CM | POA: Diagnosis not present

## 2023-03-12 DIAGNOSIS — Z0000 Encounter for general adult medical examination without abnormal findings: Secondary | ICD-10-CM | POA: Diagnosis not present

## 2023-03-12 DIAGNOSIS — Z8546 Personal history of malignant neoplasm of prostate: Secondary | ICD-10-CM | POA: Diagnosis not present

## 2023-03-12 DIAGNOSIS — I10 Essential (primary) hypertension: Secondary | ICD-10-CM | POA: Diagnosis not present

## 2023-03-12 DIAGNOSIS — I480 Paroxysmal atrial fibrillation: Secondary | ICD-10-CM | POA: Diagnosis not present

## 2023-04-09 ENCOUNTER — Ambulatory Visit (INDEPENDENT_AMBULATORY_CARE_PROVIDER_SITE_OTHER): Payer: Medicare Other | Admitting: Orthopedic Surgery

## 2023-04-09 DIAGNOSIS — L97521 Non-pressure chronic ulcer of other part of left foot limited to breakdown of skin: Secondary | ICD-10-CM

## 2023-04-09 DIAGNOSIS — Z89432 Acquired absence of left foot: Secondary | ICD-10-CM | POA: Diagnosis not present

## 2023-04-10 ENCOUNTER — Encounter: Payer: Self-pay | Admitting: Orthopedic Surgery

## 2023-04-10 NOTE — Progress Notes (Signed)
 Office Visit Note   Patient: [name]           Date of Birth: 15-Mar-1950           MRN: [id number] Visit Date: 04/09/2023              Requested by: Thana Ates, MD [address] [place],  [place] [address] PCP: Thana Ates, MD  Chief Complaint  Patient presents with   Left Foot - Follow-up    HX TMA       HPI: Patient is a 73 year old gentleman who is seen in follow-up for left transmetatarsal amputation with a Wagner grade 1 ulcer.  Patient has new custom orthotics and spacer from [facility].  Assessment & Plan: Visit Diagnoses:  1. History of transmetatarsal amputation of left foot (HCC)   2. Non-pressure chronic ulcer of other part of left foot limited to breakdown of skin (HCC)     Plan: The ulcer is healed.  Patient will follow-up as needed.  Follow-Up Instructions: Return if symptoms worsen or fail to improve.   Ortho Exam  Patient is alert, oriented, no adenopathy, well-dressed, normal affect, normal respiratory effort. Examination the calluses pared.  The ulcer has completely healed.  There is no cellulitis no drainage.  Imaging: No results found.   Labs: Lab Results  Component Value Date   HGBA1C 6.7 (H) 08/24/2022   HGBA1C 6.8 (H) 03/28/2020   HGBA1C 6.5 (H) 09/26/2015   REPTSTATUS 08/01/2016 FINAL 07/30/2016   GRAMSTAIN  06/04/2014    MODERATE WBC PRESENT, PREDOMINANTLY PMN NO SQUAMOUS EPITHELIAL CELLS SEEN MODERATE GRAM POSITIVE COCCI IN CLUSTERS Performed at [facility]    GRAMSTAIN  06/04/2014    MODERATE WBC PRESENT, PREDOMINANTLY PMN NO SQUAMOUS EPITHELIAL CELLS SEEN MODERATE GRAM POSITIVE COCCI IN PAIRS Performed at [facility]    CULT  07/30/2016    NO GROWTH Performed at [facility], [address] [place], [place] [address]      Lab Results  Component Value Date   ALBUMIN 3.2 (L) 08/24/2022   ALBUMIN 3.4 (L) 08/23/2022   ALBUMIN 4.1 08/22/2022    Lab Results  Component Value Date    MG 1.8 08/22/2022   MG 1.8 08/08/2021   MG 2.0 04/02/2020   Lab Results  Component Value Date   VD25OH 53.03 03/28/2020    No results found for: "PREALBUMIN"    Latest Ref Rng & Units 08/24/2022   12:58 AM 08/23/2022    3:49 AM 08/22/2022    3:57 PM  CBC EXTENDED  WBC 4.0 - 10.5 K/uL 6.4  5.3  9.5   RBC 4.22 - 5.81 MIL/uL 4.15  4.05  4.85   Hemoglobin 13.0 - 17.0 g/dL 86.5  78.4  69.6   HCT 39.0 - 52.0 % 37.5  38.0  43.8   Platelets 150 - 400 K/uL 187  163  236      There is no height or weight on file to calculate BMI.  Orders:  No orders of the defined types were placed in this encounter.  No orders of the defined types were placed in this encounter.    Procedures: No procedures performed  Clinical Data: No additional findings.  ROS:  All other systems negative, except as noted in the HPI. Review of Systems  Objective: Vital Signs: There were no vitals taken for this visit.  Specialty Comments:  No specialty comments available.  PMFS History: Patient Active  Problem List   Diagnosis Date Noted   Paroxysmal atrial fibrillation with RVR (HCC) 08/23/2022   Ascending aortic aneurysm (HCC) 08/23/2022   Bradycardia 08/22/2022   NSTEMI (non-ST elevated myocardial infarction) (HCC) 08/22/2022   Atrial fibrillation with RVR (HCC) 08/22/2022   Hypotension 01/07/2021   S/P arthroscopy of right shoulder 06/02/2020   Closed fracture of neck of right femur (HCC)    Hip fracture (HCC) 03/27/2020   Nontraumatic complete tear of right rotator cuff 02/26/2020   S/P transmetatarsal amputation of foot, left (HCC) 04/25/2016   Chronic osteomyelitis of toe, left (HCC)    Chronic diastolic CHF (congestive heart failure) (HCC) 09/25/2015   Aortic stenosis 08/04/2014   Diabetic foot infection (HCC) 06/03/2014   Peripheral vascular disease (HCC) 06/03/2014   Coronary artery disease involving native coronary artery of native heart without angina pectoris 07/29/2013    Trifascicular block 07/29/2013   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 remote stenting to ramus, prox LAD x2.   Dental crowns present    Diabetes mellitus    First degree AV block    GERD (gastroesophageal reflux disease)    Heart attack (HCC) 06/2004   Heart murmur    aortic   Hx MRSA infection 02/2006   Hyperlipidemia    Hypertension    hx. of - has not been on med. since losing wt. after gastric bypass   Hypothyroidism    Morbid obesity (HCC)    Mucoid cyst of joint 09/2011   left thumb   Sleep apnea    sleep study 03/29/2011; no CPAP use, lost >130lbs   Trifascicular block     Family History  Problem Relation Age of Onset   Heart disease Mother     Past Surgical History:  Procedure Laterality Date   AMPUTATION Left 04/19/2016   Procedure: Left Foot 4th Toe Amputation vs. Transmetatarsal;  Surgeon: Nadara Mustard, MD;  Location: [facility];  Service: Orthopedics;  Laterality: Left;   BIOPSY  04/07/2019   Procedure: BIOPSY;  Surgeon: Kathi Der, MD;  [facility];  Service: Gastroenterology;;   CATARACT EXTRACTION  02/2010; 03/2010   COLONOSCOPY WITH PROPOFOL N/A 09/14/2014   Procedure: COLONOSCOPY WITH PROPOFOL;  Surgeon: Charolett Bumpers, MD;  [facility];  Service: Endoscopy;  Laterality: N/A;   COLONOSCOPY WITH PROPOFOL N/A 04/07/2019   Procedure: COLONOSCOPY WITH PROPOFOL;  Surgeon: Kathi Der, MD;  Location: [facility];  Service: Gastroenterology;  Laterality: N/A;   CORONARY ANGIOPLASTY WITH STENT PLACEMENT  07/12/2004; 08/03/2004   total of 3 stents   CORONARY STENT INTERVENTION N/A 08/23/2022   Procedure:  CORONARY STENT INTERVENTION;  Surgeon: Swaziland, Peter M, MD;  Location: [facility] INVASIVE CV LAB;  Service: Cardiovascular;  Laterality: N/A;   I & D EXTREMITY Right 06/04/2014   Procedure: IRRIGATION AND DEBRIDEMENT EXTREMITY;  Surgeon: Kathryne Hitch, MD;  Location: [facility] OR;  Service: Orthopedics;  Laterality: Right;   LEFT HEART CATH AND CORONARY ANGIOGRAPHY N/A 08/23/2022   Procedure: LEFT HEART CATH AND CORONARY ANGIOGRAPHY;  Surgeon: Swaziland, Peter M, MD;  Location: [facility] INVASIVE CV LAB;  Service:  Cardiovascular;  Laterality: N/A;   MASS EXCISION  10/04/2011   Procedure: EXCISION MASS;  Surgeon: Nicki Reaper, MD;  Location: [facility];  Service: Orthopedics;  Laterality: Left;  excision cyst debridment ip joint of left thumb   PROSTATECTOMY     right below knee amputation  2018   ROUX-EN-Y GASTRIC BYPASS  10/10/2010   laparoscopic   SHOULDER ARTHROSCOPY WITH ROTATOR CUFF REPAIR AND SUBACROMIAL DECOMPRESSION Right 03/23/2020   Procedure: RIGHT SHOULDER ARTHROSCOPY WITH DEBRIDEMENT AND  ROTATOR CUFF REPAIR;  Surgeon: Kathryne Hitch, MD;  Location: [facility];  Service: Orthopedics;  Laterality: Right;   TOE AMPUTATION  02/23/2006   left foot second ray amputation   TOTAL HIP ARTHROPLASTY Right 03/29/2020   Procedure: TOTAL HIP ARTHROPLASTY POSTERIOR APPROACH;  Surgeon: Tarry Kos, MD;  Location: [facility];  Service: Orthopedics;  Laterality: Right;   TRANSMETATARSAL AMPUTATION Left    TRIGGER FINGER RELEASE Right 09/16/2019   Procedure: RELEASE TRIGGER FINGER/A-1 PULLEY;  Surgeon: Cindee Salt, MD;  Location: [facility];  Service: Orthopedics;  Laterality: Right;  IV REGIONAL FOREARM BLOCK   Social History   Occupational History   Occupation: IT trainer taxes  Tobacco Use   Smoking status: Never   Smokeless tobacco: Never  Substance and Sexual Activity   Alcohol use: No   Drug use: No   Sexual activity: Yes

## 2023-04-14 ENCOUNTER — Encounter: Payer: Self-pay | Admitting: Cardiology

## 2023-04-14 DIAGNOSIS — I480 Paroxysmal atrial fibrillation: Secondary | ICD-10-CM

## 2023-04-16 MED ORDER — APIXABAN 5 MG PO TABS: 5.0000 mg | ORAL_TABLET | Freq: Two times a day (BID) | ORAL | 1 refills | Status: DC

## 2023-04-16 MED ORDER — CLOPIDOGREL BISULFATE 75 MG PO TABS: 75.0000 mg | ORAL_TABLET | Freq: Every day | ORAL | 1 refills | Status: DC

## 2023-04-16 NOTE — Telephone Encounter (Signed)
 Prescription refill request for Eliquis received. Indication: Afib  Last office visit: 12/28/22 Anne Fu)  Scr: 0.83 (08/24/22)  Age: 73 Weight: 101.2kg  Appropriate dose. Refill sent.

## 2023-05-24 ENCOUNTER — Other Ambulatory Visit (INDEPENDENT_AMBULATORY_CARE_PROVIDER_SITE_OTHER): Payer: Self-pay

## 2023-05-24 ENCOUNTER — Encounter: Payer: Self-pay | Admitting: Physician Assistant

## 2023-05-24 ENCOUNTER — Ambulatory Visit: Admitting: Physician Assistant

## 2023-05-24 DIAGNOSIS — G8929 Other chronic pain: Secondary | ICD-10-CM

## 2023-05-24 DIAGNOSIS — M25511 Pain in right shoulder: Secondary | ICD-10-CM | POA: Diagnosis not present

## 2023-05-24 MED ORDER — METHYLPREDNISOLONE ACETATE 40 MG/ML IJ SUSP
40.0000 mg | Freq: Once | INTRAMUSCULAR | Status: AC
Start: 2023-05-24 — End: 2023-05-24
  Administered 2023-05-24: 40 mg via INTRAMUSCULAR

## 2023-05-24 MED ORDER — LIDOCAINE HCL 1 % IJ SOLN
5.0000 mL | INTRAMUSCULAR | Status: AC | PRN
  Administered 2023-05-24: 5 mL

## 2023-05-24 MED ORDER — METHYLPREDNISOLONE ACETATE 40 MG/ML IJ SUSP
40.0000 mg | INTRAMUSCULAR | Status: AC | PRN
  Administered 2023-05-24: 40 mg via INTRA_ARTICULAR

## 2023-05-24 NOTE — Progress Notes (Signed)
 Office Visit Note   Patient: HILMER LITWAK           Date of Birth: Jun 20, 1950           MRN: [id number] Visit Date: 05/24/2023              Requested by: Tena Feeling, MD [address] [place],  [place] [address] PCP: Tena Feeling, MD   Assessment & Plan: Visit Diagnoses:  1. Chronic right shoulder pain     Plan: Patient is a pleasant 73 year old gentleman comes in today with history of right shoulder pain.  He has status post right shoulder scope rotator cuff repair a few years ago with Dr. Lucienne Ryder.  He has a history of right below-knee amputation.  He said he was thought he had clipped into his socket and had not quite seeded and he fell onto his right shoulder.  No ecchymosis or swelling he is moving his shoulder she is somewhat painful.  I do not see any concerns on x-ray.  He has some tenderness with rotator cuff empty can testing also a little bit with external rotation so could possibly be coming from his glenohumeral joint.  Will try an injection today.  He is a diabetic but well-controlled if he does not get better could consider an intra-articular injection with ultrasound with either Dr. Rozelle Corning or [place]  Follow-Up Instructions: Return if symptoms worsen or fail to improve.   Orders:  Orders Placed This Encounter  Procedures  . XR Shoulder Right   Meds ordered this encounter  Medications  . methylPREDNISolone  acetate (DEPO-MEDROL ) injection 40 mg      Procedures: Large Joint Inj: R subacromial bursa on 05/24/2023 2:35 PM Indications: diagnostic evaluation and pain Details: 25 G 1.5 in needle, posterior approach  Arthrogram: No  Medications: 5 mL lidocaine  1 %; 40 mg methylPREDNISolone  acetate 40 MG/ML Outcome: tolerated well, no immediate complications Procedure, treatment alternatives, risks and benefits explained, specific risks discussed. Consent was given by the patient.     Clinical Data: No additional findings.   Subjective: No chief  complaint on file.   HPI pleasant 73 year old gentleman comes in today with right shoulder pain.  He is status post falling onto his right shoulder when he was not properly clicked into his socket status post right below-knee amputation.  He had a little soreness in the socket but not concerned about that.  Mostly concerned about the right shoulder pain has a history of right rotator cuff surgery  Review of Systems  All other systems reviewed and are negative.    Objective: Vital Signs: There were no vitals taken for this visit.  Physical Exam Constitutional:      Appearance: Normal appearance.  Skin:    General: Skin is warm and dry.  Neurological:     General: No focal deficit present.     Mental Status: He is alert and oriented to person, place, and time.  Psychiatric:        Mood and Affect: Mood normal.        Behavior: Behavior normal.   Ortho Exam Examination of his right shoulder he did has good forward elevation however hurts when he gets to about 130 degrees painful pain with internal rotation behind his back his strength is intact just painful.  Good grip strength.  No deformities are noted Specialty Comments:  No specialty comments available.  Imaging: XR Shoulder Right Result Date: 05/24/2023 Radiographs of his right shoulder  no evidence of fracture dislocation very similar to read radiographs taken 3 years ago    PMFS History: Patient Active Problem List   Diagnosis Date Noted  . Paroxysmal atrial fibrillation with RVR (HCC) 08/23/2022  . Ascending aortic aneurysm (HCC) 08/23/2022  . Bradycardia 08/22/2022  . NSTEMI (non-ST elevated myocardial infarction) (HCC) 08/22/2022  . Atrial fibrillation with RVR (HCC) 08/22/2022  . Hypotension 01/07/2021  . S/P arthroscopy of right shoulder 06/02/2020  . Closed fracture of neck of right femur (HCC)   . Hip fracture (HCC) 03/27/2020  . Nontraumatic complete tear of right rotator cuff 02/26/2020  . S/P  transmetatarsal amputation of foot, left (HCC) 04/25/2016  . Chronic osteomyelitis of toe, left (HCC)   . Chronic diastolic CHF (congestive heart failure) (HCC) 09/25/2015  . Aortic stenosis 08/04/2014  . Diabetic foot infection (HCC) 06/03/2014  . Peripheral vascular disease (HCC) 06/03/2014  . Coronary artery disease involving native coronary artery of native heart without angina pectoris 07/29/2013  . Trifascicular block 07/29/2013  . 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 Hypothyroidism 01/22/2007  . Type 2 diabetes mellitus with vascular disease (HCC) 01/22/2007  . HYPERLIPIDEMIA, MIXED 01/22/2007  . MYOCARDIAL INFARCTION, HX OF 01/22/2007  . ANEMIA, IRON DEFICIENCY, HX OF 01/22/2007   Past Medical History:  Diagnosis Date  . Anemia    low iron  . Arthritis   . Coronary artery disease 2006   a. remote stenting to ramus, prox LAD x2.  . Dental crowns present   . Diabetes mellitus   . First degree AV block   . GERD (gastroesophageal reflux disease)   . Heart attack (HCC) 06/2004  . Heart murmur    aortic  . Hx MRSA infection 02/2006  . Hyperlipidemia   . Hypertension    hx. of - has not been on med. since losing wt. after gastric bypass  . Hypothyroidism   . Morbid obesity (HCC)   . Mucoid cyst of joint 09/2011   left thumb  . Sleep apnea    sleep study 03/29/2011; no CPAP use, lost >130lbs  . Trifascicular block     Family History  Problem Relation Age of Onset  . Heart disease Mother     Past Surgical History:  Procedure Laterality Date  . AMPUTATION Left 04/19/2016   Procedure: Left Foot 4th Toe Amputation vs. Transmetatarsal;  Surgeon: Timothy Ford, MD;  Location: [facility];  Service: Orthopedics;  Laterality: Left;  . BIOPSY  04/07/2019   Procedure: BIOPSY;  Surgeon: Felecia Hopper, MD;  Location: [facility];  Service: Gastroenterology;;  . CATARACT EXTRACTION  02/2010; 03/2010  . COLONOSCOPY WITH PROPOFOL  N/A 09/14/2014   Procedure: COLONOSCOPY WITH  PROPOFOL ;  Surgeon: Garrett Kallman, MD;  Location: [facility];  Service: Endoscopy;  Laterality: N/A;  . COLONOSCOPY WITH PROPOFOL  N/A 04/07/2019   Procedure: COLONOSCOPY WITH PROPOFOL ;  Surgeon: Felecia Hopper, MD;  Location: [facility];  Service: Gastroenterology;  Laterality: N/A;  . CORONARY ANGIOPLASTY WITH STENT PLACEMENT  07/12/2004; 08/03/2004   total of 3 stents  . CORONARY STENT INTERVENTION N/A 08/23/2022   Procedure: CORONARY STENT INTERVENTION;  Surgeon: Swaziland, Peter M, MD;  Location[facility] INVASIVE CV LAB;  Service: Cardiovascular;  Laterality: N/A;  . I & D EXTREMITY Right 06/04/2014   Procedure: IRRIGATION AND DEBRIDEMENT EXTREMITY;  Surgeon: Arnie Lao, MD;  Location: [facility];  Service: Orthopedics;  Laterality: Right;  . LEFT HEART CATH AND CORONARY ANGIOGRAPHY N/A 08/23/2022  Procedure: LEFT HEART CATH AND CORONARY ANGIOGRAPHY;  Surgeon: Swaziland, Peter M, MD;  [facility] INVASIVE CV LAB;  Service: Cardiovascular;  Laterality: N/A;  . MASS EXCISION  10/04/2011   Procedure: EXCISION MASS;  Surgeon: Kemp Patter, MD;  Location: [facility];  Service: Orthopedics;  Laterality: Left;  excision cyst debridment ip joint of left thumb  . PROSTATECTOMY    . right below knee amputation  2018  . ROUX-EN-Y GASTRIC BYPASS  10/10/2010   laparoscopic  . SHOULDER ARTHROSCOPY WITH ROTATOR CUFF REPAIR AND SUBACROMIAL DECOMPRESSION Right 03/23/2020   Procedure: RIGHT SHOULDER ARTHROSCOPY WITH DEBRIDEMENT AND  ROTATOR CUFF REPAIR;  Surgeon: Arnie Lao, MD;  Location: [facility];  Service: Orthopedics;  Laterality: Right;  . TOE AMPUTATION  02/23/2006   left foot second ray amputation  . TOTAL HIP ARTHROPLASTY Right 03/29/2020   Procedure: TOTAL HIP ARTHROPLASTY POSTERIOR APPROACH;  Surgeon: Wes Hamman, MD;  Location: [facility];  Service: Orthopedics;  Laterality: Right;  . TRANSMETATARSAL AMPUTATION Left   . TRIGGER FINGER RELEASE Right 09/16/2019   Procedure: RELEASE  TRIGGER FINGER/A-1 PULLEY;  Surgeon: Lyanne Sample, MD;  Location: [facility];  Service: Orthopedics;  Laterality: Right;  IV REGIONAL FOREARM BLOCK   Social History   Occupational History  . Occupation: IT trainer taxes  Tobacco Use  . Smoking status: Never  . Smokeless tobacco: Never  Substance and Sexual Activity  . Alcohol use: No  . Drug use: No  . Sexual activity: Yes

## 2023-06-06 DIAGNOSIS — Z9889 Other specified postprocedural states: Secondary | ICD-10-CM | POA: Diagnosis not present

## 2023-06-06 DIAGNOSIS — H04123 Dry eye syndrome of bilateral lacrimal glands: Secondary | ICD-10-CM | POA: Diagnosis not present

## 2023-06-06 DIAGNOSIS — E119 Type 2 diabetes mellitus without complications: Secondary | ICD-10-CM | POA: Diagnosis not present

## 2023-06-06 DIAGNOSIS — Z961 Presence of intraocular lens: Secondary | ICD-10-CM | POA: Diagnosis not present

## 2023-06-11 DIAGNOSIS — Z8546 Personal history of malignant neoplasm of prostate: Secondary | ICD-10-CM | POA: Diagnosis not present

## 2023-06-15 ENCOUNTER — Telehealth: Payer: Self-pay

## 2023-06-22 ENCOUNTER — Telehealth: Payer: Self-pay

## 2023-06-22 DIAGNOSIS — G8929 Other chronic pain: Secondary | ICD-10-CM

## 2023-06-27 ENCOUNTER — Emergency Department (HOSPITAL_COMMUNITY)

## 2023-06-27 ENCOUNTER — Observation Stay (HOSPITAL_COMMUNITY)
Admission: EM | Admit: 2023-06-27 | Discharge: 2023-06-28 | Disposition: A | Discharge status: Home / Self Care | Attending: Cardiology | Admitting: Cardiology

## 2023-06-27 ENCOUNTER — Encounter (HOSPITAL_COMMUNITY): Payer: Self-pay

## 2023-06-27 ENCOUNTER — Other Ambulatory Visit: Payer: Self-pay

## 2023-06-27 DIAGNOSIS — E039 Hypothyroidism, unspecified: Secondary | ICD-10-CM | POA: Diagnosis not present

## 2023-06-27 DIAGNOSIS — Z96641 Presence of right artificial hip joint: Secondary | ICD-10-CM | POA: Diagnosis not present

## 2023-06-27 DIAGNOSIS — Z89511 Acquired absence of right leg below knee: Secondary | ICD-10-CM | POA: Diagnosis not present

## 2023-06-27 DIAGNOSIS — E785 Hyperlipidemia, unspecified: Secondary | ICD-10-CM | POA: Insufficient documentation

## 2023-06-27 DIAGNOSIS — I480 Paroxysmal atrial fibrillation: Secondary | ICD-10-CM | POA: Diagnosis not present

## 2023-06-27 DIAGNOSIS — Z955 Presence of coronary angioplasty implant and graft: Secondary | ICD-10-CM | POA: Insufficient documentation

## 2023-06-27 DIAGNOSIS — I2510 Atherosclerotic heart disease of native coronary artery without angina pectoris: Secondary | ICD-10-CM | POA: Insufficient documentation

## 2023-06-27 DIAGNOSIS — E119 Type 2 diabetes mellitus without complications: Secondary | ICD-10-CM | POA: Insufficient documentation

## 2023-06-27 DIAGNOSIS — Z89422 Acquired absence of other left toe(s): Secondary | ICD-10-CM | POA: Insufficient documentation

## 2023-06-27 DIAGNOSIS — I10 Essential (primary) hypertension: Secondary | ICD-10-CM | POA: Diagnosis not present

## 2023-06-27 DIAGNOSIS — R0989 Other specified symptoms and signs involving the circulatory and respiratory systems: Secondary | ICD-10-CM | POA: Diagnosis not present

## 2023-06-27 DIAGNOSIS — I4892 Unspecified atrial flutter: Secondary | ICD-10-CM | POA: Diagnosis not present

## 2023-06-27 DIAGNOSIS — I959 Hypotension, unspecified: Secondary | ICD-10-CM | POA: Diagnosis not present

## 2023-06-27 DIAGNOSIS — I483 Typical atrial flutter: Secondary | ICD-10-CM

## 2023-06-27 DIAGNOSIS — R7989 Other specified abnormal findings of blood chemistry: Secondary | ICD-10-CM | POA: Diagnosis not present

## 2023-06-27 DIAGNOSIS — R079 Chest pain, unspecified: Secondary | ICD-10-CM | POA: Diagnosis not present

## 2023-06-27 DIAGNOSIS — R42 Dizziness and giddiness: Secondary | ICD-10-CM | POA: Diagnosis not present

## 2023-06-27 DIAGNOSIS — R002 Palpitations: Secondary | ICD-10-CM | POA: Diagnosis not present

## 2023-06-27 DIAGNOSIS — R000 Tachycardia, unspecified: Secondary | ICD-10-CM | POA: Diagnosis not present

## 2023-06-27 LAB — CBC
HCT: 40.6 % (ref 39.0–52.0)
Hemoglobin: 13.7 g/dL (ref 13.0–17.0)
MCH: 31.7 pg (ref 26.0–34.0)
MCHC: 33.7 g/dL (ref 30.0–36.0)
MCV: 94 fL (ref 80.0–100.0)
Platelets: 181 10*3/uL (ref 150–400)
RBC: 4.32 MIL/uL (ref 4.22–5.81)
RDW: 13.2 % (ref 11.5–15.5)
WBC: 5.6 10*3/uL (ref 4.0–10.5)
nRBC: 0 % (ref 0.0–0.2)

## 2023-06-27 LAB — TROPONIN I (HIGH SENSITIVITY)
Troponin I (High Sensitivity): 19 ng/L — ABNORMAL HIGH (ref <18)
Troponin I (High Sensitivity): 99 ng/L — ABNORMAL HIGH (ref <18)

## 2023-06-27 LAB — BASIC METABOLIC PANEL WITH GFR
Anion gap: 8 (ref 5–15)
BUN: 15 mg/dL (ref 8–23)
CO2: 23 mmol/L (ref 22–32)
Calcium: 8.7 mg/dL — ABNORMAL LOW (ref 8.9–10.3)
Chloride: 107 mmol/L (ref 98–111)
Creatinine, Ser: 0.98 mg/dL (ref 0.61–1.24)
GFR, Estimated: >60 mL/min (ref >60)
Glucose, Bld: 153 mg/dL — ABNORMAL HIGH (ref 70–99)
Potassium: 3.6 mmol/L (ref 3.5–5.1)
Sodium: 138 mmol/L (ref 135–145)

## 2023-06-27 LAB — MRSA NEXT GEN BY PCR, NASAL: MRSA by PCR Next Gen: NOT DETECTED

## 2023-06-27 LAB — GLUCOSE, CAPILLARY: Glucose-Capillary: 166 mg/dL — ABNORMAL HIGH (ref 70–99)

## 2023-06-27 MED ORDER — SODIUM CHLORIDE 0.9 % IV BOLUS
500.0000 mL | Freq: Once | INTRAVENOUS | Status: AC
  Administered 2023-06-27: 500 mL via INTRAVENOUS

## 2023-06-27 MED ORDER — DILTIAZEM LOAD VIA INFUSION
10.0000 mg | Freq: Once | INTRAVENOUS | Status: AC
  Administered 2023-06-27: 10 mg via INTRAVENOUS
  Filled 2023-06-27: qty 10

## 2023-06-27 MED ORDER — APIXABAN 5 MG PO TABS
5.0000 mg | ORAL_TABLET | Freq: Two times a day (BID) | ORAL | Status: DC
  Administered 2023-06-27 – 2023-06-28 (×2): 5 mg via ORAL
  Filled 2023-06-27 (×2): qty 1

## 2023-06-27 MED ORDER — LEVOTHYROXINE SODIUM 25 MCG PO TABS
125.0000 ug | ORAL_TABLET | Freq: Every day | ORAL | Status: DC
  Administered 2023-06-28: 125 ug via ORAL
  Filled 2023-06-27: qty 1

## 2023-06-27 MED ORDER — ETOMIDATE 2 MG/ML IV SOLN
10.0000 mg | Freq: Once | INTRAVENOUS | Status: AC
  Administered 2023-06-27: 10 mg via INTRAVENOUS
  Filled 2023-06-27: qty 10

## 2023-06-27 MED ORDER — ACETAMINOPHEN 325 MG PO TABS
650.0000 mg | ORAL_TABLET | ORAL | Status: DC | PRN
  Administered 2023-06-27: 650 mg via ORAL
  Filled 2023-06-27: qty 2

## 2023-06-27 MED ORDER — ATORVASTATIN CALCIUM 40 MG PO TABS
40.0000 mg | ORAL_TABLET | Freq: Every day | ORAL | Status: DC
  Administered 2023-06-27: 40 mg via ORAL
  Filled 2023-06-27: qty 1

## 2023-06-27 MED ORDER — CLOPIDOGREL BISULFATE 75 MG PO TABS
75.0000 mg | ORAL_TABLET | Freq: Every day | ORAL | Status: DC
  Administered 2023-06-28: 75 mg via ORAL
  Filled 2023-06-27: qty 1

## 2023-06-27 MED ORDER — ONDANSETRON HCL 4 MG/2ML IJ SOLN: 4.0000 mg | Freq: Four times a day (QID) | INTRAMUSCULAR | Status: DC | PRN

## 2023-06-27 MED ORDER — DILTIAZEM HCL-DEXTROSE 125-5 MG/125ML-% IV SOLN (PREMIX)
5.0000 mg/h | INTRAVENOUS | Status: DC
  Administered 2023-06-27: 5 mg/h via INTRAVENOUS
  Filled 2023-06-27 (×2): qty 125

## 2023-06-27 NOTE — ED Notes (Signed)
 Per lab, Troponin, 99, Belfi, MD notified.

## 2023-06-27 NOTE — Plan of Care (Signed)
  Problem: Education: Goal: Knowledge of General Education information will improve Description: Including pain rating scale, medication(s)/side effects and non-pharmacologic comfort measures Outcome: Progressing   Problem: Health Behavior/Discharge Planning: Goal: Ability to manage health-related needs will improve Outcome: Progressing   Problem: Pain Managment: Goal: General experience of comfort will improve and/or be controlled Outcome: Progressing

## 2023-06-27 NOTE — H&P (Signed)
 Cardiology H&P   Patient ID: [name] MRN: [id number]; DOB: [birth year]  Admit date: 06/27/2023 Date of Consult: 06/27/2023  PCP:  Tena Feeling, MD   [facility] Providers Cardiologist:  Dorothye Gathers, MD    Patient Profile: [name] is a 73 y.o. male with a hx of CAD s/p pLAD stent (residual RCA disease unable to stent), paroxysmal atrial fibrillation, DM, HTN, HLD, s/p right BKA who is being seen 06/27/2023 for the evaluation of atrial flutter with RVR at the request of Dr. Nolia Baumgartner.  History of Present Illness: Mr. [name] is a 73 yo male with PMH noted above, followed by Dr. Renna Cary as an outpatient.  He was admitted 07/2022 with tachycardia and lightheadedness and found to be in atrial fibrillation with RVR.  He was treated with IV Lopressor  but developed junctional rhythm.  Also with mild chest discomfort and slight ST depression noted on EKG.  He underwent left heart cath at that time which showed 90% proximal LAD stenosis, tandem 99% proximal RCA stenosis.  Underwent successful PCI/DES x 1 to proximal LAD with attempted PCI of RCA but unable to cross wire.  Treated with triple therapy with aspirin , Plavix  and Eliquis  x 1 month.  He was also evaluated by EP that admission in the setting of sinus bradycardia with proximal atrial fibrillation.  It was felt he may need pacemaker in the future.  Echocardiogram that admission with LVEF of 60 to 65%, no regional wall motion abnormality, grade 1 diastolic dysfunction, normal RV function, no significant valvular disease.  He has been followed by Dr. Julio Ohm as an outpatient for his right BKA.  Last seen in the office 12/2018 for with Dr. Renna Cary and reported being in his usual state of health.  He was continued on Eliquis  5 mg twice daily as well as Plavix . Also noted a large benign polyp at recent visit with GI at the junction of the large and small intestine with plans for surgical removal pending blood thinner clearance.  Notes indicate this was  tentatively planned for July.  Presented to the ED on 6/4 with complaints of palpitations.  Reports that he woke up around 430 this morning and just felt " not right", somewhat jittery feeling.  Went to check his blood pressure and noted his heart rate was in the 130s.  Did have some minor chest discomfort.  No true palpitations.  Rechecked his blood pressure and heart rate about an hour later and noted his heart rate was still elevated.  Woke his wife up, and told her of his symptoms.  Presented to the ED.  Labs in the ED showed sodium 138, potassium 3.6, creatinine 0.98, high-sensitivity troponin 19, WBC 5.6, hemoglobin 13.7.  Chest x-ray with mild central congestion.  He was started on IV diltiazem with 10mg  bolus at 0830, rates remain in the 120s.  He reports full compliance with his home dose Eliquis .   Past Medical History:  Diagnosis Date   Anemia    low iron   Arthritis    Coronary artery disease 2006   a. remote stenting to ramus, prox LAD x2.   Dental crowns present    Diabetes mellitus    First degree AV block    GERD (gastroesophageal reflux disease)    Heart attack (HCC) 06/2004   Heart murmur    aortic   Hx MRSA infection 02/2006   Hyperlipidemia    Hypertension    hx. of - has not been on med.  since losing wt. after gastric bypass   Hypothyroidism    Morbid obesity (HCC)    Mucoid cyst of joint 09/2011   left thumb   Sleep apnea    sleep study 03/29/2011; no CPAP use, lost >130lbs   Trifascicular block     Past Surgical History:  Procedure Laterality Date   AMPUTATION Left 04/19/2016   Procedure: Left Foot 4th Toe Amputation vs. Transmetatarsal;  Surgeon: Timothy Ford, MD;  Location: [facility] OR;  Service: Orthopedics;  Laterality: Left;   BIOPSY  04/07/2019   Procedure: BIOPSY;  Surgeon: Felecia Hopper, MD;  Location: [facility];  Service: Gastroenterology;;   CATARACT EXTRACTION  02/2010; 03/2010   COLONOSCOPY WITH PROPOFOL  N/A 09/14/2014   Procedure: COLONOSCOPY WITH  PROPOFOL ;  Surgeon: Garrett Kallman, MD;  Location: [facility];  Service: Endoscopy;  Laterality: N/A;   COLONOSCOPY WITH PROPOFOL  N/A 04/07/2019   Procedure: COLONOSCOPY WITH PROPOFOL ;  Surgeon: Felecia Hopper, MD;  Location: [facility];  Service: Gastroenterology;  Laterality: N/A;   CORONARY ANGIOPLASTY WITH STENT PLACEMENT  07/12/2004; 08/03/2004   total of 3 stents   CORONARY STENT INTERVENTION N/A 08/23/2022   Procedure: CORONARY STENT INTERVENTION;  Surgeon: Swaziland, Peter M, MD;  Location: [facility] INVASIVE CV LAB;  Service: Cardiovascular;  Laterality: N/A;   I & D EXTREMITY Right 06/04/2014   Procedure: IRRIGATION AND DEBRIDEMENT EXTREMITY;  Surgeon: Arnie Lao, MD;  Location: [facility] OR;  Service: Orthopedics;  Laterality: Right;   LEFT HEART CATH AND CORONARY ANGIOGRAPHY N/A 08/23/2022   Procedure: LEFT HEART CATH AND CORONARY ANGIOGRAPHY;  Surgeon: Swaziland, Peter M, MD;  Location[facility] INVASIVE CV LAB;  Service: Cardiovascular;  Laterality: N/A;   MASS EXCISION  10/04/2011   Procedure: EXCISION MASS;  Surgeon: Kemp Patter, MD;  Location: [facility];  Service: Orthopedics;  Laterality: Left;  excision cyst debridment ip joint of left thumb   PROSTATECTOMY     right below knee amputation  2018   ROUX-EN-Y GASTRIC BYPASS  10/10/2010   laparoscopic   SHOULDER ARTHROSCOPY WITH ROTATOR CUFF REPAIR AND SUBACROMIAL DECOMPRESSION Right 03/23/2020   Procedure: RIGHT SHOULDER ARTHROSCOPY WITH DEBRIDEMENT AND  ROTATOR CUFF REPAIR;  Surgeon: Arnie Lao, MD;  Location: [facility] OR;  Service: Orthopedics;  Laterality: Right;   TOE AMPUTATION  02/23/2006   left foot second ray amputation   TOTAL HIP ARTHROPLASTY Right 03/29/2020   Procedure: TOTAL HIP ARTHROPLASTY POSTERIOR APPROACH;  Surgeon: Wes Hamman, MD;  Location: [facility] OR;  Service: Orthopedics;  Laterality: Right;   TRANSMETATARSAL AMPUTATION Left    TRIGGER FINGER RELEASE Right 09/16/2019   Procedure: RELEASE TRIGGER  FINGER/A-1 PULLEY;  Surgeon: Lyanne Sample, MD;  Location: [facility];  Service: Orthopedics;  Laterality: Right;  IV REGIONAL FOREARM BLOCK    Scheduled Meds:   Continuous Infusions:  diltiazem (CARDIZEM) infusion 5 mg/hr (06/27/23 0829)   PRN Meds:   Allergies:    Allergies  Allergen Reactions   Other Other (See Comments)   Moxifloxacin Rash    Avelox Rash at injection site   Penicillins Rash     Has patient had a PCN reaction causing immediate rash, facial/tongue/throat swelling, SOB or lightheadedness with hypotension: #  #  #  YES  #  #  #  Has patient had a PCN reaction causing severe rash involving mucus membranes or skin necrosis: No Has patient had a PCN reaction that required hospitalization unknown Has patient had a PCN reaction occurring within  the last 10 years: No If all of the above answers are "NO", then may proceed with Cephalosporin use.   Quinolones Itching   Sulfamethoxazole -Trimethoprim  Itching, Rash and Other (See Comments)    Social History:   Social History   Socioeconomic History   Marital status: Married    Spouse name: Not on file   Number of children: Not on file   Years of education: Not on file   Highest education level: Not on file  Occupational History   Occupation: IT trainer taxes  Tobacco Use   Smoking status: Never   Smokeless tobacco: Never  Substance and Sexual Activity   Alcohol use: No   Drug use: No   Sexual activity: Yes  Other Topics Concern   Not on file  Social History Narrative   Parents and sister all using CPAP   Social Drivers of Corporate investment banker Strain: Not on file  Food Insecurity: No Food Insecurity (08/22/2022)   Hunger Vital Sign    Worried About Running Out of Food in the Last Year: Never true    Ran Out of Food in the Last Year: Never true  Transportation Needs: No Transportation Needs (08/22/2022)   PRAPARE - Administrator, Civil Service (Medical): No    Lack of  Transportation (Non-Medical): No  Physical Activity: Not on file  Stress: Not on file  Social Connections: Not on file  Intimate Partner Violence: Not At Risk (08/22/2022)   Humiliation, Afraid, Rape, and Kick questionnaire    Fear of Current or Ex-Partner: No    Emotionally Abused: No    Physically Abused: No    Sexually Abused: No    Family History:    Family History  Problem Relation Age of Onset   Heart disease Mother      ROS:  Please see the history of present illness.   All other ROS reviewed and negative.     Physical Exam/Data: Vitals:   06/27/23 0630 06/27/23 0631 06/27/23 0730 06/27/23 0845  BP: 95/74  96/75 98/76  Pulse: (!) 123  62 (!) 121  Resp:   18 17  Temp: 98.2 F (36.8 C)     TempSrc: Oral     SpO2: 92%  100% 100%  Weight:  104.3 kg    Height:  6\' 8"  (2.032 m)     No intake or output data in the 24 hours ending 06/27/23 0959    06/27/2023    6:31 AM 02/13/2023   12:00 PM 12/28/2022    2:19 PM  Last 3 Weights  Weight (lbs) 230 lb 223 lb 219 lb 3.2 oz  Weight (kg) 104.327 kg 101.152 kg 99.428 kg     Body mass index is 25.27 kg/m.  General:  Well nourished, well developed, in no acute distress HEENT: normal Neck: no JVD Vascular: No carotid bruits; Distal pulses 2+ bilaterally Cardiac:  normal S1, S2; Irreg Irreg; no murmur  Lungs:  clear to auscultation bilaterally, no wheezing, rhonchi or rales  Abd: soft, nontender, no hepatomegaly  Ext: no edema Musculoskeletal:  R BKA Skin: warm and dry  Neuro: no focal abnormalities noted Psych:  Normal affect   EKG:  The EKG was personally reviewed and demonstrates:  Atrial flutter 122bpm, RBBB, LAFB Telemetry:  Telemetry was personally reviewed and demonstrates:  Atrial flutter w/RVR, rates 120s  Relevant CV Studies:  Echo: 07/2022  IMPRESSIONS     1. Left ventricular ejection fraction, by estimation, is 60 to 65%.  The  left ventricle has normal function. The left ventricle has no regional   wall motion abnormalities. Left ventricular diastolic parameters are  consistent with Grade I diastolic  dysfunction (impaired relaxation).   2. Right ventricular systolic function is normal. The right ventricular  size is mildly enlarged.   3. The mitral valve is normal in structure. No evidence of mitral valve  regurgitation. No evidence of mitral stenosis.   4. The aortic valve is calcified. There is mild calcification of the  aortic valve. There is mild thickening of the aortic valve. Aortic valve  regurgitation is trivial. Aortic valve sclerosis is present, with no  evidence of aortic valve stenosis.   5. Aortic dilatation noted. There is mild dilatation of the aortic root,  measuring 41 mm. There is mild dilatation of the ascending aorta,  measuring 44 mm.   6. The inferior vena cava is normal in size with greater than 50%  respiratory variability, suggesting right atrial pressure of 3 mmHg.   FINDINGS   Left Ventricle: Left ventricular ejection fraction, by estimation, is 60  to 65%. The left ventricle has normal function. The left ventricle has no  regional wall motion abnormalities. The left ventricular internal cavity  size was normal in size. There is   no left ventricular hypertrophy. Left ventricular diastolic parameters  are consistent with Grade I diastolic dysfunction (impaired relaxation).   Right Ventricle: The right ventricular size is mildly enlarged. No  increase in right ventricular wall thickness. Right ventricular systolic  function is normal.   Left Atrium: Left atrial size was normal in size.   Right Atrium: Right atrial size was normal in size.   Pericardium: There is no evidence of pericardial effusion.   Mitral Valve: The mitral valve is normal in structure. Mild mitral annular  calcification. No evidence of mitral valve regurgitation. No evidence of  mitral valve stenosis. MV peak gradient, 2.2 mmHg. The mean mitral valve  gradient is 1.0 mmHg.    Tricuspid Valve: The tricuspid valve is normal in structure. Tricuspid  valve regurgitation is not demonstrated. No evidence of tricuspid  stenosis.   Aortic Valve: The aortic valve is calcified. There is mild calcification  of the aortic valve. There is mild thickening of the aortic valve. Aortic  valve regurgitation is trivial. Aortic valve sclerosis is present, with no  evidence of aortic valve  stenosis. Aortic valve mean gradient measures 6.0 mmHg. Aortic valve peak  gradient measures 11.6 mmHg. Aortic valve area, by VTI measures 3.15 cm.   Pulmonic Valve: The pulmonic valve was normal in structure. Pulmonic valve  regurgitation is not visualized. No evidence of pulmonic stenosis.   Aorta: Aortic dilatation noted. There is mild dilatation of the aortic  root, measuring 41 mm. There is mild dilatation of the ascending aorta,  measuring 44 mm.   Venous: The inferior vena cava is normal in size with greater than 50%  respiratory variability, suggesting right atrial pressure of 3 mmHg.   IAS/Shunts: No atrial level shunt detected by color flow Doppler.   07/2022    Prox LAD-1 lesion is 90% stenosed.   Prox RCA-1 lesion is 99% stenosed.   Prox RCA-2 lesion is 99% stenosed.   Previously placed Prox LAD-2 stent of unknown type is  widely patent.   Previously placed Ramus stent of unknown type is  widely patent.   Previously placed Dist RCA stent of unknown type is  widely patent.   A drug-eluting stent was successfully placed  using a SYNERGY XD 3.50X12.   Post intervention, there is a 0% residual stenosis.   Post intervention, there is a 0% residual stenosis.   Post intervention, there is a 99% residual stenosis.   Post intervention, there is a 99% residual stenosis.   LV end diastolic pressure is normal.   Recommend to resume Apixaban , at currently prescribed dose and frequency on 08/24/2022.   Recommend concurrent antiplatelet therapy of Aspirin  81 mg for 1 month and  Clopidogrel  75mg  daily for 12 months .   Severe 2 vessel obstructive CAD. 90% proximal LAD stenosis just proximal to and in the very proximal portion of the prior stent. Tandem 99% stenoses in the proximal and mid RCA. Distal RCA and ramus intermediate stents are patent Normal LVEDP Successful PCI of the proximal LAD with DES x 1 overlapping with prior stent Unsuccessful PCI of the RCA due to inability to cross the second lesion with a wire.   Plan: DAPT with ASA for 1-4 weeks, plavix  for 12 months. OK to start anticoagulation with Eliquis  in am. Further management of arrhythmia per rounding team. If needed I think the RCA could be addressed at a later date using a CTO approach.   Diagnostic Dominance: Right  Intervention     Laboratory Data: High Sensitivity Troponin:   Recent Labs  Lab 06/27/23 0716 06/27/23 0816  TROPONINIHS 19* 99*     Chemistry Recent Labs  Lab 06/27/23 0716  NA 138  K 3.6  CL 107  CO2 23  GLUCOSE 153*  BUN 15  CREATININE 0.98  CALCIUM  8.7*  GFRNONAA >60  ANIONGAP 8    No results for input(s): "PROT", "ALBUMIN", "AST", "ALT", "ALKPHOS", "BILITOT" in the last 168 hours. Lipids No results for input(s): "CHOL", "TRIG", "HDL", "LABVLDL", "LDLCALC", "CHOLHDL" in the last 168 hours.  Hematology Recent Labs  Lab 06/27/23 0716  WBC 5.6  RBC 4.32  HGB 13.7  HCT 40.6  MCV 94.0  MCH 31.7  MCHC 33.7  RDW 13.2  PLT 181   Thyroid  No results for input(s): "TSH", "FREET4" in the last 168 hours.  BNPNo results for input(s): "BNP", "PROBNP" in the last 168 hours.  DDimer No results for input(s): "DDIMER" in the last 168 hours.  Radiology/Studies:  DG Chest 2 View Result Date: 06/27/2023 CLINICAL DATA:  Chest pain EXAM: CHEST - 2 VIEW COMPARISON:  Chest x-ray performed September 08, 2022 FINDINGS: Mild hypoventilatory changes and prominence of central pulmonary vasculature. Heart mediastinum are grossly similar. No significant pleural effusion. No  pneumothorax. IMPRESSION: 1. Low lung volumes with mild central congestion. Electronically Signed   By: Reagan Camera M.D.   On: 06/27/2023 07:13     Assessment and Plan:  [name] is a 73 y.o. male with a hx of CAD s/p pLAD stent (residual RCA disease unable to stent), paroxysmal atrial fibrillation, DM, HTN, HLD, s/p right BKA who is being seen 06/27/2023 for the evaluation of atrial flutter with RVR at the request of Dr. Nolia Baumgartner.  Atrial flutter with RVR -- presented with " jittery sensation", after noting his pulse rate was elevated this morning when taking his blood pressure around 4:30 AM.  Denies any true palpitations. -- Initial EKG with atrial flutter, rates in the 120s noted on telemetry -- He has been fully compliant with his Eliquis  prior to admission -- Given diltiazem 10 mg x 1 and currently on IV diltiazem without much improvement in rates -- Did discuss with patient likely attempt for cardioversion  given his compliance with Eliquis , suspect he will not convert with IV diltiazem given atrial flutter as underlying rhythm. Will need to monitor to ensure no significant bradycardia afterwards  Informed Consent   Shared Decision Making/Informed Consent{  The risks (stroke, cardiac arrhythmias rarely resulting in the need for a temporary or permanent pacemaker, skin irritation or burns and complications associated with conscious sedation including aspiration, arrhythmia, respiratory failure and death), benefits (restoration of normal sinus rhythm) and alternatives of a direct current cardioversion were explained in detail to Mr. Shannahan and he agrees to proceed.   CAD s/p pLAD stent '24 -- did have some mild chest discomfort, but now resolved -- remains on plavix , statin  HTN -- losartan  PTA  HLD -- on statin  Addendum:   Elevated Troponin -- initial hsTn 19, repeat 99 -- will plan for observation overnight for continued cycling   Risk Assessment/Risk Scores:  CHA2DS2-VASc  Score = 4  This indicates a 4.8% annual risk of stroke. The patient's score is based upon: CHF History: 0 HTN History: 1 Diabetes History: 1 Stroke History: 0 Vascular Disease History: 1 Age Score: 1 Gender Score: 0   For questions or updates, please contact [facility] Please consult [email or web address] for contact info under    Signed, Johnie Nailer, NP  06/27/2023 9:59 AM

## 2023-06-27 NOTE — Plan of Care (Signed)

## 2023-06-27 NOTE — Progress Notes (Signed)
 RT at bedside for conscious sedation. End tidal C02 30.

## 2023-06-27 NOTE — ED Notes (Signed)
 Patient awake and alert, AOX4, no s/s of distress, no new complaints of pain or palpitations.

## 2023-06-27 NOTE — Consult Note (Deleted)
 Cardiology Consultation   Patient ID: LIN GLAZIER MRN: [id number]; DOB: 02/08/1950  Admit date: 06/27/2023 Date of Consult: 06/27/2023  PCP:  Tena Feeling, MD   [facility] Providers Cardiologist:  Dorothye Gathers, MD    Patient Profile: [name] is a 73 y.o. male with a hx of CAD s/p pLAD stent (residual RCA disease unable to stent), paroxysmal atrial fibrillation, DM, HTN, HLD, s/p right BKA who is being seen 06/27/2023 for the evaluation of atrial flutter with RVR at the request of Dr. Nolia Baumgartner.  History of Present Illness: [name] is a 73 yo male with PMH noted above, followed by Dr. Renna Cary as an outpatient.  He was admitted 07/2022 with tachycardia and lightheadedness and found to be in atrial fibrillation with RVR.  He was treated with IV Lopressor  but developed junctional rhythm.  Also with mild chest discomfort and slight ST depression noted on EKG.  He underwent left heart cath at that time which showed 90% proximal LAD stenosis, tandem 99% proximal RCA stenosis.  Underwent successful PCI/DES x 1 to proximal LAD with attempted PCI of RCA but unable to cross wire.  Treated with triple therapy with aspirin , Plavix  and Eliquis  x 1 month.  He was also evaluated by EP that admission in the setting of sinus bradycardia with proximal atrial fibrillation.  It was felt he may need pacemaker in the future.  Echocardiogram that admission with LVEF of 60 to 65%, no regional wall motion abnormality, grade 1 diastolic dysfunction, normal RV function, no significant valvular disease.  He has been followed by Dr. Julio Ohm as an outpatient for his right BKA.  Last seen in the office 12/2018 for with Dr. Renna Cary and reported being in his usual state of health.  He was continued on Eliquis  5 mg twice daily as well as Plavix . Also noted a large benign polyp at recent visit with GI at the junction of the large and small intestine with plans for surgical removal pending blood thinner clearance.  Notes indicate  this was tentatively planned for July.  Presented to the ED on 6/4 with complaints of palpitations.  Reports that he woke up around 430 this morning and just felt " not right", somewhat jittery feeling.  Went to check his blood pressure and noted his heart rate was in the 130s.  Did have some minor chest discomfort.  No true palpitations.  Rechecked his blood pressure and heart rate about an hour later and noted his heart rate was still elevated.  Woke his wife up, and told her of his symptoms.  Presented to the ED.  Labs in the ED showed sodium 138, potassium 3.6, creatinine 0.98, high-sensitivity troponin 19, WBC 5.6, hemoglobin 13.7.  Chest x-ray with mild central congestion.  He was started on IV diltiazem with 10mg  bolus at 0830, rates remain in the 120s.  He reports full compliance with his home dose Eliquis .   Past Medical History:  Diagnosis Date   Anemia    low iron   Arthritis    Coronary artery disease 2006   a. remote stenting to ramus, prox LAD x2.   Dental crowns present    Diabetes mellitus    First degree AV block    GERD (gastroesophageal reflux disease)    Heart attack (HCC) 06/2004   Heart murmur    aortic   Hx MRSA infection 02/2006   Hyperlipidemia    Hypertension    hx. of - has not been on med.  since losing wt. after gastric bypass   Hypothyroidism    Morbid obesity (HCC)    Mucoid cyst of joint 09/2011   left thumb   Sleep apnea    sleep study 03/29/2011; no CPAP use, lost >130lbs   Trifascicular block     Past Surgical History:  Procedure Laterality Date   AMPUTATION Left 04/19/2016   Procedure: Left Foot 4th Toe Amputation vs. Transmetatarsal;  Surgeon: Timothy Ford, MD;  Location: [facility];  Service: Orthopedics;  Laterality: Left;   BIOPSY  04/07/2019   Procedure: BIOPSY;  Surgeon: Felecia Hopper, MD;  Location: [facility];  Service: Gastroenterology;;   CATARACT EXTRACTION  02/2010; 03/2010   COLONOSCOPY WITH PROPOFOL  N/A 09/14/2014   Procedure:  COLONOSCOPY WITH PROPOFOL ;  Surgeon: Garrett Kallman, MD;  Location: [facility];  Service: Endoscopy;  Laterality: N/A;   COLONOSCOPY WITH PROPOFOL  N/A 04/07/2019   Procedure: COLONOSCOPY WITH PROPOFOL ;  Surgeon: Felecia Hopper, MD;  Location: [facility];  Service: Gastroenterology;  Laterality: N/A;   CORONARY ANGIOPLASTY WITH STENT PLACEMENT  07/12/2004; 08/03/2004   total of 3 stents   CORONARY STENT INTERVENTION N/A 08/23/2022   Procedure: CORONARY STENT INTERVENTION;  Surgeon: Swaziland, Peter M, MD;  Location: [facility] INVASIVE CV LAB;  Service: Cardiovascular;  Laterality: N/A;   I & D EXTREMITY Right 06/04/2014   Procedure: IRRIGATION AND DEBRIDEMENT EXTREMITY;  Surgeon: Arnie Lao, MD;  Location: [facility] OR;  Service: Orthopedics;  Laterality: Right;   LEFT HEART CATH AND CORONARY ANGIOGRAPHY N/A 08/23/2022   Procedure: LEFT HEART CATH AND CORONARY ANGIOGRAPHY;  Surgeon: Swaziland, Peter M, MD;  Location: [facility] INVASIVE CV LAB;  Service: Cardiovascular;  Laterality: N/A;   MASS EXCISION  10/04/2011   Procedure: EXCISION MASS;  Surgeon: Kemp Patter, MD;  Location: [facility];  Service: Orthopedics;  Laterality: Left;  excision cyst debridment ip joint of left thumb   PROSTATECTOMY     right below knee amputation  2018   ROUX-EN-Y GASTRIC BYPASS  10/10/2010   laparoscopic   SHOULDER ARTHROSCOPY WITH ROTATOR CUFF REPAIR AND SUBACROMIAL DECOMPRESSION Right 03/23/2020   Procedure: RIGHT SHOULDER ARTHROSCOPY WITH DEBRIDEMENT AND  ROTATOR CUFF REPAIR;  Surgeon: Arnie Lao, MD;  Location: [facility];  Service: Orthopedics;  Laterality: Right;   TOE AMPUTATION  02/23/2006   left foot second ray amputation   TOTAL HIP ARTHROPLASTY Right 03/29/2020   Procedure: TOTAL HIP ARTHROPLASTY POSTERIOR APPROACH;  Surgeon: Wes Hamman, MD;  Location: [facility] OR;  Service: Orthopedics;  Laterality: Right;   TRANSMETATARSAL AMPUTATION Left    TRIGGER FINGER RELEASE Right 09/16/2019   Procedure:  RELEASE TRIGGER FINGER/A-1 PULLEY;  Surgeon: Lyanne Sample, MD;  Location: [facility];  Service: Orthopedics;  Laterality: Right;  IV REGIONAL FOREARM BLOCK    Scheduled Meds:  diltiazem  10 mg Intravenous Once   Continuous Infusions:  diltiazem (CARDIZEM) infusion     sodium chloride      PRN Meds:   Allergies:    Allergies  Allergen Reactions   Other Other (See Comments)   Moxifloxacin Rash    Avelox Rash at injection site   Penicillins Rash     Has patient had a PCN reaction causing immediate rash, facial/tongue/throat swelling, SOB or lightheadedness with hypotension: #  #  #  YES  #  #  #  Has patient had a PCN reaction causing severe rash involving mucus membranes or skin necrosis: No Has patient had a PCN reaction that  required hospitalization unknown Has patient had a PCN reaction occurring within the last 10 years: No If all of the above answers are "NO", then may proceed with Cephalosporin use.   Quinolones Itching   Sulfamethoxazole -Trimethoprim  Itching, Rash and Other (See Comments)    Social History:   Social History   Socioeconomic History   Marital status: Married    Spouse name: Not on file   Number of children: Not on file   Years of education: Not on file   Highest education level: Not on file  Occupational History   Occupation: IT trainer taxes  Tobacco Use   Smoking status: Never   Smokeless tobacco: Never  Substance and Sexual Activity   Alcohol use: No   Drug use: No   Sexual activity: Yes  Other Topics Concern   Not on file  Social History Narrative   Parents and sister all using CPAP   Social Drivers of Corporate investment banker Strain: Not on file  Food Insecurity: No Food Insecurity (08/22/2022)   Hunger Vital Sign    Worried About Running Out of Food in the Last Year: Never true    Ran Out of Food in the Last Year: Never true  Transportation Needs: No Transportation Needs (08/22/2022)   PRAPARE - Scientist, research (physical sciences) (Medical): No    Lack of Transportation (Non-Medical): No  Physical Activity: Not on file  Stress: Not on file  Social Connections: Not on file  Intimate Partner Violence: Not At Risk (08/22/2022)   Humiliation, Afraid, Rape, and Kick questionnaire    Fear of Current or Ex-Partner: No    Emotionally Abused: No    Physically Abused: No    Sexually Abused: No    Family History:    Family History  Problem Relation Age of Onset   Heart disease Mother      ROS:  Please see the history of present illness.   All other ROS reviewed and negative.     Physical Exam/Data: Vitals:   06/27/23 0630 06/27/23 0631 06/27/23 0730  BP: 95/74  96/75  Pulse: (!) 123  62  Resp:   18  Temp: 98.2 F (36.8 C)    TempSrc: Oral    SpO2: 92%  100%  Weight:  104.3 kg   Height:  6\' 8"  (2.032 m)    No intake or output data in the 24 hours ending 06/27/23 0802    06/27/2023    6:31 AM 02/13/2023   12:00 PM 12/28/2022    2:19 PM  Last 3 Weights  Weight (lbs) 230 lb 223 lb 219 lb 3.2 oz  Weight (kg) 104.327 kg 101.152 kg 99.428 kg     Body mass index is 25.27 kg/m.  General:  Well nourished, well developed, in no acute distress HEENT: normal Neck: no JVD Vascular: No carotid bruits; Distal pulses 2+ bilaterally Cardiac:  normal S1, S2; Irreg Irreg; no murmur  Lungs:  clear to auscultation bilaterally, no wheezing, rhonchi or rales  Abd: soft, nontender, no hepatomegaly  Ext: no edema Musculoskeletal:  R BKA Skin: warm and dry  Neuro: no focal abnormalities noted Psych:  Normal affect   EKG:  The EKG was personally reviewed and demonstrates:  Atrial flutter 122bpm, RBBB, LAFB Telemetry:  Telemetry was personally reviewed and demonstrates:  Atrial flutter w/RVR, rates 120s  Relevant CV Studies:  Echo: 07/2022  IMPRESSIONS     1. Left ventricular ejection fraction, by estimation, is 60 to 65%.  The  left ventricle has normal function. The left ventricle has no regional   wall motion abnormalities. Left ventricular diastolic parameters are  consistent with Grade I diastolic  dysfunction (impaired relaxation).   2. Right ventricular systolic function is normal. The right ventricular  size is mildly enlarged.   3. The mitral valve is normal in structure. No evidence of mitral valve  regurgitation. No evidence of mitral stenosis.   4. The aortic valve is calcified. There is mild calcification of the  aortic valve. There is mild thickening of the aortic valve. Aortic valve  regurgitation is trivial. Aortic valve sclerosis is present, with no  evidence of aortic valve stenosis.   5. Aortic dilatation noted. There is mild dilatation of the aortic root,  measuring 41 mm. There is mild dilatation of the ascending aorta,  measuring 44 mm.   6. The inferior vena cava is normal in size with greater than 50%  respiratory variability, suggesting right atrial pressure of 3 mmHg.   FINDINGS   Left Ventricle: Left ventricular ejection fraction, by estimation, is 60  to 65%. The left ventricle has normal function. The left ventricle has no  regional wall motion abnormalities. The left ventricular internal cavity  size was normal in size. There is   no left ventricular hypertrophy. Left ventricular diastolic parameters  are consistent with Grade I diastolic dysfunction (impaired relaxation).   Right Ventricle: The right ventricular size is mildly enlarged. No  increase in right ventricular wall thickness. Right ventricular systolic  function is normal.   Left Atrium: Left atrial size was normal in size.   Right Atrium: Right atrial size was normal in size.   Pericardium: There is no evidence of pericardial effusion.   Mitral Valve: The mitral valve is normal in structure. Mild mitral annular  calcification. No evidence of mitral valve regurgitation. No evidence of  mitral valve stenosis. MV peak gradient, 2.2 mmHg. The mean mitral valve  gradient is 1.0 mmHg.    Tricuspid Valve: The tricuspid valve is normal in structure. Tricuspid  valve regurgitation is not demonstrated. No evidence of tricuspid  stenosis.   Aortic Valve: The aortic valve is calcified. There is mild calcification  of the aortic valve. There is mild thickening of the aortic valve. Aortic  valve regurgitation is trivial. Aortic valve sclerosis is present, with no  evidence of aortic valve  stenosis. Aortic valve mean gradient measures 6.0 mmHg. Aortic valve peak  gradient measures 11.6 mmHg. Aortic valve area, by VTI measures 3.15 cm.   Pulmonic Valve: The pulmonic valve was normal in structure. Pulmonic valve  regurgitation is not visualized. No evidence of pulmonic stenosis.   Aorta: Aortic dilatation noted. There is mild dilatation of the aortic  root, measuring 41 mm. There is mild dilatation of the ascending aorta,  measuring 44 mm.   Venous: The inferior vena cava is normal in size with greater than 50%  respiratory variability, suggesting right atrial pressure of 3 mmHg.   IAS/Shunts: No atrial level shunt detected by color flow Doppler.   07/2022    Prox LAD-1 lesion is 90% stenosed.   Prox RCA-1 lesion is 99% stenosed.   Prox RCA-2 lesion is 99% stenosed.   Previously placed Prox LAD-2 stent of unknown type is  widely patent.   Previously placed Ramus stent of unknown type is  widely patent.   Previously placed Dist RCA stent of unknown type is  widely patent.   A drug-eluting stent was successfully placed  using a SYNERGY XD 3.50X12.   Post intervention, there is a 0% residual stenosis.   Post intervention, there is a 0% residual stenosis.   Post intervention, there is a 99% residual stenosis.   Post intervention, there is a 99% residual stenosis.   LV end diastolic pressure is normal.   Recommend to resume Apixaban , at currently prescribed dose and frequency on 08/24/2022.   Recommend concurrent antiplatelet therapy of Aspirin  81 mg for 1 month and  Clopidogrel  75mg  daily for 12 months .   Severe 2 vessel obstructive CAD. 90% proximal LAD stenosis just proximal to and in the very proximal portion of the prior stent. Tandem 99% stenoses in the proximal and mid RCA. Distal RCA and ramus intermediate stents are patent Normal LVEDP Successful PCI of the proximal LAD with DES x 1 overlapping with prior stent Unsuccessful PCI of the RCA due to inability to cross the second lesion with a wire.   Plan: DAPT with ASA for 1-4 weeks, plavix  for 12 months. OK to start anticoagulation with Eliquis  in am. Further management of arrhythmia per rounding team. If needed I think the RCA could be addressed at a later date using a CTO approach.   Diagnostic Dominance: Right  Intervention     Laboratory Data: High Sensitivity Troponin:  No results for input(s): "TROPONINIHS" in the last 720 hours.   Chemistry Recent Labs  Lab 06/27/23 0716  NA 138  K 3.6  CL 107  CO2 23  GLUCOSE 153*  BUN 15  CREATININE 0.98  CALCIUM  8.7*  GFRNONAA >60  ANIONGAP 8    No results for input(s): "PROT", "ALBUMIN", "AST", "ALT", "ALKPHOS", "BILITOT" in the last 168 hours. Lipids No results for input(s): "CHOL", "TRIG", "HDL", "LABVLDL", "LDLCALC", "CHOLHDL" in the last 168 hours.  Hematology Recent Labs  Lab 06/27/23 0716  WBC 5.6  RBC 4.32  HGB 13.7  HCT 40.6  MCV 94.0  MCH 31.7  MCHC 33.7  RDW 13.2  PLT 181   Thyroid  No results for input(s): "TSH", "FREET4" in the last 168 hours.  BNPNo results for input(s): "BNP", "PROBNP" in the last 168 hours.  DDimer No results for input(s): "DDIMER" in the last 168 hours.  Radiology/Studies:  DG Chest 2 View Result Date: 06/27/2023 CLINICAL DATA:  Chest pain EXAM: CHEST - 2 VIEW COMPARISON:  Chest x-ray performed September 08, 2022 FINDINGS: Mild hypoventilatory changes and prominence of central pulmonary vasculature. Heart mediastinum are grossly similar. No significant pleural effusion. No pneumothorax.  IMPRESSION: 1. Low lung volumes with mild central congestion. Electronically Signed   By: Reagan Camera M.D.   On: 06/27/2023 07:13     Assessment and Plan:  HUNG RHINESMITH is a 73 y.o. male with a hx of CAD s/p pLAD stent (residual RCA disease unable to stent), paroxysmal atrial fibrillation, DM, HTN, HLD, s/p right BKA who is being seen 06/27/2023 for the evaluation of atrial flutter with RVR at the request of Dr. Nolia Baumgartner.  Atrial flutter with RVR -- presented with " jittery sensation", after noting his pulse rate was elevated this morning when taking his blood pressure around 4:30 AM.  Denies any true palpitations. -- Initial EKG with atrial flutter, rates in the 120s noted on telemetry -- He has been fully compliant with his Eliquis  prior to admission -- Given diltiazem 10 mg x 1 and currently on IV diltiazem without much improvement in rates -- Did discuss with patient likely attempt for cardioversion given his compliance with Eliquis ,  suspect he will not convert with IV diltiazem given atrial flutter as underlying rhythm. Will need to monitor to ensure no significant bradycardia afterwards  Informed Consent   Shared Decision Making/Informed Consent{  The risks (stroke, cardiac arrhythmias rarely resulting in the need for a temporary or permanent pacemaker, skin irritation or burns and complications associated with conscious sedation including aspiration, arrhythmia, respiratory failure and death), benefits (restoration of normal sinus rhythm) and alternatives of a direct current cardioversion were explained in detail to Mr. Kanouse and he agrees to proceed.   CAD s/p pLAD stent '24 -- did have some mild chest discomfort, but now resolved -- remains on plavix , statin  HTN -- losartan  PTA  HLD -- on statin  Risk Assessment/Risk Scores:  CHA2DS2-VASc Score = 4  This indicates a 4.8% annual risk of stroke. The patient's score is based upon: CHF History: 0 HTN History: 1 Diabetes History:  1 Stroke History: 0 Vascular Disease History: 1 Age Score: 1 Gender Score: 0   For questions or updates, please contact [facility] Please consult [email or web address] for contact info under    Signed, Johnie Nailer, NP  06/27/2023 8:02 AM

## 2023-06-27 NOTE — ED Provider Notes (Signed)
 [facility] Provider Note   CSN: [id number] Arrival date & time: 06/27/23  4098     History  Chief Complaint  Patient presents with   Palpitations    Pt arrived by ems d/t palpitations beginning around 0430 this morning. Per ems, pt stated that last time this happened it was d/t his afib and he ended up having to have a stent placed. Pt received 500 ml saline during transit.    KETRICK MATNEY is a 73 y.o. male.  Patient is a 73 year old male with a history of coronary artery disease status post stent placement, ascending aortic aneurysm, hypertension, hyperlipidemia and prior history of atrial fibrillation on Eliquis  who presents with palpitations.  He said he woke up during the night with some chest discomfort and feeling palpitations.  He said its persisted throughout the morning.  He did have some chest discomfort which has resolved.  He denies any shortness of breath.  No leg swelling.  He does have a history of a prior right leg amputation.  On chart review, he was admitted in July for palpitations.  He was given a dose of metoprolol  which did convert him out of the atrial fibrillation but converted him to junctional bradycardia rhythm.  It was felt that if this occurred again, he may need a pacemaker.  He did have a cardiac catheterization at that time as well due to elevated troponins.  He was found to have coronary artery disease and had a stent placed.       Home Medications Prior to Admission medications   Medication Sig Start Date End Date Taking? Authorizing Provider  methylPREDNISolone  (MEDROL ) 4 MG tablet Medrol  dose pack. Take as instructed 02/20/23   Arnie Lao, MD  amoxicillin  (AMOXIL ) 500 MG tablet Take 4 tablets by mouth 1 hour prior to dental procedure. Patient not taking: Reported on 02/13/2023 05/22/22   Wes Hamman, MD  apixaban  (ELIQUIS ) 5 MG TABS tablet Take 1 tablet (5 mg total) by mouth 2 (two) times  daily. 04/16/23 04/15/24  Hugh Madura, MD  atorvastatin  (LIPITOR) 40 MG tablet Take 1 tablet (40 mg total) by mouth at bedtime. 08/24/22 02/13/23  Pokhrel, Amador Bad, MD  Calcium  Citrate-Vitamin D  (CALCIUM  CITRATE CHEWY BITE PO) Take 500 mg by mouth 3 (three) times daily.    [provider]  clopidogrel  (PLAVIX ) 75 MG tablet Take 1 tablet (75 mg total) by mouth daily with breakfast. 04/16/23 04/15/24  Hugh Madura, MD  cyclobenzaprine  (FLEXERIL ) 10 MG tablet Take 1 tablet (10 mg total) by mouth at bedtime. 09/14/22   Bronson Canny, PA-C  famotidine  (PEPCID ) 40 MG tablet Take 40 mg by mouth at bedtime as needed.    [provider]  ferrous sulfate  325 (65 FE) MG tablet Take 325 mg by mouth See admin instructions. Take one tablet by mouth on Monday, Wednesday and Fridays    [provider]  fluticasone  (FLONASE ) 50 MCG/ACT nasal spray Place 2 sprays into both nostrils daily as needed for allergies.     [provider]  gabapentin  (NEURONTIN ) 100 MG capsule TAKE 1 CAPSULE BY MOUTH 4 TIMES A DAY WHEN NECESSARY FOR NEUROPATHY PAIN 02/22/23   Zamora, Erin R, NP  Hypromellose (GENTEAL SEVERE OP) Place 1 drop into the right eye daily as needed (Dry eyes).    [provider]  ketoconazole (NIZORAL) 2 % cream Apply 1 Application topically daily as needed for rash. 04/25/21  [provider]  levothyroxine  (SYNTHROID ) 125 MCG tablet Take 1 tablet (125 mcg total) by mouth daily at 6 (six) AM. 08/25/22   Pokhrel, Amador Bad, MD  losartan  (COZAAR ) 25 MG tablet Take 1 tablet (25 mg total) by mouth daily. 02/22/23   Hugh Madura, MD  metFORMIN  (GLUCOPHAGE -XR) 500 MG 24 hr tablet Take 1,000 mg by mouth 2 (two) times daily. 08/16/18   [provider]  Multiple Vitamin (MULTIVITAMIN WITH MINERALS) TABS tablet Take 1 tablet by mouth in the morning and at bedtime. Gummies Patient taking differently: Take 2 tablets by mouth in the morning and at bedtime. Gummies 04/02/20    Gonfa, Taye T, MD  Multiple Vitamins-Minerals (PRESERVISION AREDS 2) CAPS Take 1 capsule by mouth daily. 09/06/16   [provider]  nitroGLYCERIN  (NITROSTAT ) 0.4 MG SL tablet Place 1 tablet (0.4 mg total) under the tongue every 5 (five) minutes x 3 doses as needed for chest pain. 01/20/22   Hugh Madura, MD  Omega 3 1000 MG CAPS Take 1,000 mg by mouth daily.    [provider]  tadalafil (CIALIS) 10 MG tablet Take 5 mg by mouth daily as needed for erectile dysfunction. Pt takes 1-2 tablets as needed. 09/01/22   [provider]  tiZANidine  (ZANAFLEX ) 2 MG tablet Take 1 tablet (2 mg total) by mouth every 8 (eight) hours as needed for muscle spasms. 01/10/23   Arnie Lao, MD  Valerian Root 450 MG CAPS Take 450 mg by mouth at bedtime.    [provider]  vitamin B-12 (CYANOCOBALAMIN ) 1000 MCG tablet Take 1,000 mcg by mouth 3 (three) times a week. Mon, wed, Friday    [provider]      Allergies    Other, Moxifloxacin, Penicillins, Quinolones, and Sulfamethoxazole -trimethoprim     Review of Systems   Review of Systems  Constitutional:  Negative for chills, diaphoresis, fatigue and fever.  HENT:  Negative for congestion, rhinorrhea and sneezing.   Eyes: Negative.   Respiratory:  Positive for chest tightness. Negative for cough and shortness of breath.   Cardiovascular:  Positive for palpitations. Negative for chest pain and leg swelling.  Gastrointestinal:  Negative for abdominal pain, blood in stool, diarrhea, nausea and vomiting.  Genitourinary:  Negative for difficulty urinating, flank pain, frequency and hematuria.  Musculoskeletal:  Negative for arthralgias and back pain.  Skin:  Negative for rash.  Neurological:  Negative for dizziness, speech difficulty, weakness, numbness and headaches.    Physical Exam Updated Vital Signs BP 98/74   Pulse 64   Temp 98.2 F (36.8 C) (Oral)   Resp 17   Ht 6\' 8"  (2.032 m)   Wt 104.3 kg    SpO2 100%   BMI 25.27 kg/m  Physical Exam Constitutional:      Appearance: He is well-developed.  HENT:     Head: Normocephalic and atraumatic.  Eyes:     Pupils: Pupils are equal, round, and reactive to light.  Cardiovascular:     Rate and Rhythm: Regular rhythm. Tachycardia present.     Heart sounds: Normal heart sounds.  Pulmonary:     Effort: Pulmonary effort is normal. No respiratory distress.     Breath sounds: Normal breath sounds. No wheezing or rales.  Chest:     Chest wall: No tenderness.  Abdominal:     General: Bowel sounds are normal.     Palpations: Abdomen is soft.     Tenderness: There is no abdominal tenderness. There is no  guarding or rebound.  Musculoskeletal:        General: Normal range of motion.     Cervical back: Normal range of motion and neck supple.     Comments: Right prosthetic leg.  No significant edema to the left leg.  Lymphadenopathy:     Cervical: No cervical adenopathy.  Skin:    General: Skin is warm and dry.     Findings: No rash.  Neurological:     Mental Status: He is alert and oriented to person, place, and time.     ED Results / Procedures / Treatments   Labs (all labs ordered are listed, but only abnormal results are displayed) Labs Reviewed  BASIC METABOLIC PANEL WITH GFR - Abnormal; Notable for the following components:      Result Value   Glucose, Bld 153 (*)    Calcium  8.7 (*)    All other components within normal limits  TROPONIN I (HIGH SENSITIVITY) - Abnormal; Notable for the following components:   Troponin I (High Sensitivity) 19 (*)    All other components within normal limits  TROPONIN I (HIGH SENSITIVITY) - Abnormal; Notable for the following components:   Troponin I (High Sensitivity) 99 (*)    All other components within normal limits  CBC    EKG EKG Interpretation Date/Time:  Wednesday June 27 2023 06:26:46 EDT Ventricular Rate:  122 PR Interval:  106 QRS Duration:  136 QT Interval:  359 QTC  Calculation: 512 R Axis:   -77  Text Interpretation: wide complex tachycardia RBBB and LAFB ST elevation, consider inferior injury Confirmed by Hershel Los [phone]) on 06/27/2023 7:15:36 AM  Radiology DG Chest 2 View Result Date: 06/27/2023 CLINICAL DATA:  Chest pain EXAM: CHEST - 2 VIEW COMPARISON:  Chest x-ray performed September 08, 2022 FINDINGS: Mild hypoventilatory changes and prominence of central pulmonary vasculature. Heart mediastinum are grossly similar. No significant pleural effusion. No pneumothorax. IMPRESSION: 1. Low lung volumes with mild central congestion. Electronically Signed   By: Reagan Camera M.D.   On: 06/27/2023 07:13    Procedures .Cardioversion  Date/Time: 06/27/2023 11:11 AM  Performed by: Hershel Los, MD Authorized by: Hershel Los, MD   Consent:    Consent obtained:  Written   Consent given by:  Patient   Risks discussed:  Induced arrhythmia and cutaneous burn   Alternatives discussed:  No treatment Pre-procedure details:    Cardioversion basis:  Emergent   Rhythm:  Atrial flutter   Electrode placement:  Anterior-posterior Patient sedated: Yes. Refer to sedation procedure documentation for details of sedation.  Attempt one:    Cardioversion mode:  Synchronous   Waveform:  Biphasic   Shock (Joules):  200   Shock outcome:  Conversion to normal sinus rhythm Post-procedure details:    Patient status:  Awake   Patient tolerance of procedure:  Tolerated well, no immediate complications .Sedation  Date/Time: 06/27/2023 11:12 AM  Performed by: Hershel Los, MD Authorized by: Hershel Los, MD   Consent:    Consent obtained:  Written   Consent given by:  Patient   Risks discussed:  Allergic reaction, prolonged hypoxia resulting in organ damage, prolonged sedation necessitating reversal, respiratory compromise necessitating ventilatory assistance and intubation, vomiting and dysrhythmia Universal protocol:    Immediately prior to procedure, a time  out was called: yes     Patient identity confirmed:  Verbally with patient Indications:    Procedure performed:  Cardioversion   Procedure necessitating sedation performed by:  Physician performing sedation Pre-sedation  assessment:    Time since last food or drink:  6pm last night   ASA classification: class 3 - patient with severe systemic disease     Mouth opening:  3 or more finger widths   Thyromental distance:  3 finger widths   Mallampati score:  III - soft palate, base of uvula visible   Neck mobility: normal     Pre-sedation assessments completed and reviewed: airway patency, cardiovascular function, hydration status, mental status, nausea/vomiting, pain level, respiratory function and temperature   A pre-sedation assessment was completed prior to the start of the procedure Immediate pre-procedure details:    Reviewed: vital signs, relevant labs/tests and NPO status     Verified: bag valve mask available, emergency equipment available, intubation equipment available, IV patency confirmed and oxygen available   Procedure details (see MAR for exact dosages):    Preoxygenation:  Nasal cannula   Sedation:  Etomidate   Intended level of sedation: deep   Intra-procedure monitoring:  Blood pressure monitoring, cardiac monitor, continuous capnometry, continuous pulse oximetry, frequent LOC assessments and frequent vital sign checks   Intra-procedure events: none     Total Provider sedation time (minutes):  15 Post-procedure details:   A post-sedation assessment was completed following the completion of the procedure.   Attendance: Constant attendance by certified staff until patient recovered     Recovery: Patient returned to pre-procedure baseline     Post-sedation assessments completed and reviewed: airway patency, cardiovascular function, hydration status, mental status, nausea/vomiting, pain level, respiratory function and temperature     Patient is stable for discharge or admission:  yes     Procedure completion:  Tolerated well, no immediate complications   CRITICAL CARE Performed by: Hershel Los Total critical care time: 60 minutes Critical care time was exclusive of separately billable procedures and treating other patients. Critical care was necessary to treat or prevent imminent or life-threatening deterioration. Critical care was time spent personally by me on the following activities: development of treatment plan with patient and/or surrogate as well as nursing, discussions with consultants, evaluation of patient's response to treatment, examination of patient, obtaining history from patient or surrogate, ordering and performing treatments and interventions, ordering and review of laboratory studies, ordering and review of radiographic studies, pulse oximetry and re-evaluation of patient's condition.  Medications Ordered in ED Medications  diltiazem (CARDIZEM) 1 mg/mL load via infusion 10 mg (10 mg Intravenous Bolus from Bag 06/27/23 0829)    And  diltiazem (CARDIZEM) 125 mg in dextrose  5% 125 mL (1 mg/mL) infusion (5 mg/hr Intravenous New Bag/Given 06/27/23 0829)  sodium chloride  0.9 % bolus 500 mL (0 mLs Intravenous Stopped 06/27/23 0933)  etomidate (AMIDATE) injection 10 mg (10 mg Intravenous Given 06/27/23 1102)    ED Course/ Medical Decision Making/ A&P                                 Medical Decision Making Amount and/or Complexity of Data Reviewed Labs: ordered. Radiology: ordered.  Risk Prescription drug management. Decision regarding hospitalization.   This patient presents to the ED for concern of palpitations, this involves an extensive number of treatment options, and is a complaint that carries with it a high risk of complications and morbidity.  I considered the following differential and admission for this acute, potentially life threatening condition.  The differential diagnosis includes sinus tachycardia, dehydration, other tachyarrhythmias,  ACS, PE, infection  MDM:  0745: Discussed with Dr. [place].  Will hold off on beta-blockers at this point given his prior adverse effect with bradycardia.  Will start Cardizem.  Cardiology to see the patient.  Dr. [place] has seen the patient and recommends bedside cardioversion which was successfully performed.  However his troponins are mildly elevated.  His second 1 was higher than the first.  Given this, cardiology to admit the patient for observation.    Labs: I Ordered, and personally interpreted labs.  The pertinent results include: Elevated troponins  Imaging Studies ordered: I ordered imaging studies including chest x-ray I independently visualized and interpreted imaging. I agree with the radiologist interpretation  Additional history obtained from chart.  External records from outside source obtained and reviewed including prior ED/cardiology notes  Cardiac Monitoring: The patient was maintained on a cardiac monitor.  If on the cardiac monitor, I personally viewed and interpreted the cardiac monitored which showed an underlying rhythm of: Atrial flutter with tachycardia  Reevaluation: After the interventions noted above, I reevaluated the patient and found that they have :improved  Social Determinants of Health:  none  Disposition: Admitted to hospital  Co morbidities that complicate the patient evaluation  Past Medical History:  Diagnosis Date   Anemia    low iron   Arthritis    Coronary artery disease 2006   a. remote stenting to ramus, prox LAD x2.   Dental crowns present    Diabetes mellitus    First degree AV block    GERD (gastroesophageal reflux disease)    Heart attack (HCC) 06/2004   Heart murmur    aortic   Hx MRSA infection 02/2006   Hyperlipidemia    Hypertension    hx. of - has not been on med. since losing wt. after gastric bypass   Hypothyroidism    Morbid obesity (HCC)    Mucoid cyst of joint 09/2011   left thumb   Sleep apnea    sleep  study 03/29/2011; no CPAP use, lost >130lbs   Trifascicular block      Medicines Meds ordered this encounter  Medications   sodium chloride  0.9 % bolus 500 mL   AND Linked Order Group    diltiazem (CARDIZEM) 1 mg/mL load via infusion 10 mg    diltiazem (CARDIZEM) 125 mg in dextrose  5% 125 mL (1 mg/mL) infusion   etomidate (AMIDATE) injection 10 mg    I have reviewed the patients home medicines and have made adjustments as needed  Problem List / ED Course: Problem List Items Addressed This Visit   None Visit Diagnoses       Atrial flutter with rapid ventricular response (HCC)    -  Primary   Relevant Medications   diltiazem (CARDIZEM) 1 mg/mL load via infusion 10 mg (Completed)   diltiazem (CARDIZEM) 125 mg in dextrose  5% 125 mL (1 mg/mL) infusion             Final Clinical Impression(s) / ED Diagnoses Final diagnoses:  Atrial flutter with rapid ventricular response (HCC)    Rx / DC Orders ED Discharge Orders     None         Hershel Los, MD 06/27/23 1116

## 2023-06-27 NOTE — Sedation Documentation (Signed)
 Patient is resting comfortably.

## 2023-06-28 ENCOUNTER — Other Ambulatory Visit: Payer: Self-pay | Admitting: Thoracic Surgery (Cardiothoracic Vascular Surgery)

## 2023-06-28 DIAGNOSIS — I4892 Unspecified atrial flutter: Secondary | ICD-10-CM | POA: Diagnosis not present

## 2023-06-28 DIAGNOSIS — I483 Typical atrial flutter: Secondary | ICD-10-CM | POA: Diagnosis not present

## 2023-06-28 DIAGNOSIS — I7121 Aneurysm of the ascending aorta, without rupture: Secondary | ICD-10-CM

## 2023-06-28 LAB — LIPID PANEL
Cholesterol: 98 mg/dL (ref 0–200)
HDL: 30 mg/dL — ABNORMAL LOW (ref >40)
LDL Cholesterol: 53 mg/dL (ref 0–99)
Total CHOL/HDL Ratio: 3.3 ratio
Triglycerides: 75 mg/dL (ref <150)
VLDL: 15 mg/dL (ref 0–40)

## 2023-06-28 LAB — CBC
HCT: 36.4 % — ABNORMAL LOW (ref 39.0–52.0)
Hemoglobin: 12.3 g/dL — ABNORMAL LOW (ref 13.0–17.0)
MCH: 32.5 pg (ref 26.0–34.0)
MCHC: 33.8 g/dL (ref 30.0–36.0)
MCV: 96.3 fL (ref 80.0–100.0)
Platelets: 169 10*3/uL (ref 150–400)
RBC: 3.78 MIL/uL — ABNORMAL LOW (ref 4.22–5.81)
RDW: 13.5 % (ref 11.5–15.5)
WBC: 6.7 10*3/uL (ref 4.0–10.5)
nRBC: 0 % (ref 0.0–0.2)

## 2023-06-28 LAB — BASIC METABOLIC PANEL WITH GFR
Anion gap: 8 (ref 5–15)
BUN: 18 mg/dL (ref 8–23)
CO2: 24 mmol/L (ref 22–32)
Calcium: 8.8 mg/dL — ABNORMAL LOW (ref 8.9–10.3)
Chloride: 106 mmol/L (ref 98–111)
Creatinine, Ser: 1.38 mg/dL — ABNORMAL HIGH (ref 0.61–1.24)
GFR, Estimated: 54 mL/min — ABNORMAL LOW (ref >60)
Glucose, Bld: 132 mg/dL — ABNORMAL HIGH (ref 70–99)
Potassium: 3.6 mmol/L (ref 3.5–5.1)
Sodium: 138 mmol/L (ref 135–145)

## 2023-06-28 LAB — TROPONIN I (HIGH SENSITIVITY): Troponin I (High Sensitivity): 1709 ng/L (ref <18)

## 2023-06-28 NOTE — Progress Notes (Signed)
  Progress Note  Patient Name: [name] Date of Encounter: 06/28/2023 [facility] Cardiologist: Dorothye Gathers, MD   Interval Summary   No complaints today  Vital Signs Vitals:   06/27/23 1914 06/27/23 2259 06/28/23 0403 06/28/23 0826  BP: 115/70 101/66 123/77 (!) 147/78  Pulse: (!) 57 (!) 53 (!) 44 60  Resp: 12 12 15    Temp: 97.9 F (36.6 C) 97.6 F (36.4 C) 97.7 F (36.5 C) 98 F (36.7 C)  TempSrc: Oral Oral Oral Oral  SpO2: 100% 95% 96%   Weight:      Height:        Intake/Output Summary (Last 24 hours) at 06/28/2023 0830 Last data filed at 06/28/2023 0600 Gross per 24 hour  Intake 132.92 ml  Output 100 ml  Net 32.92 ml      06/27/2023    4:00 PM 06/27/2023    6:31 AM 02/13/2023   12:00 PM  Last 3 Weights  Weight (lbs) 217 lb 9.5 oz 230 lb 223 lb  Weight (kg) 98.7 kg 104.327 kg 101.152 kg      Telemetry/ECG  NSR with some PACs. Bradycardic at times but no pauses or sustained arrhythmia. No flutter. - Personally Reviewed  Physical Exam  GEN: No acute distress.   Neck: No JVD Cardiac: RRR, no murmurs, rubs, or gallops.  Respiratory: Clear to auscultation bilaterally. GI: Soft, nontender, non-distended  MS: No edema  Assessment & Plan  Atrial flutter. S/p DCCV. Observed overnight due to history of bradycardia and mildly elevated troponin. Troponin most c/w arrhythmia and not ischemia. On no rate slowing medication Plan DC home today. Has follow up next week in office. Would recommend appt with Dr Arlester Ladd to discuss management of Afib/flutter  Physician time 20 minutes    For questions or updates, please contact [facility] Please consult [email or web address] for contact info under       Signed, Claudette Wermuth [place], MD

## 2023-06-28 NOTE — Care Management Obs Status (Signed)
 MEDICARE OBSERVATION STATUS NOTIFICATION   Patient Details  Name: [name] MRN: [id number] Date of Birth: [birth year]   Medicare Observation Status Notification Given:    [place]oon/Obs letter signed and copy given    Wynonia Hedges 06/28/2023, 10:25 AM

## 2023-06-28 NOTE — TOC Transition Note (Signed)
 Transition of Care [facility]) - Discharge Note   Patient Details  Name: [name] MRN: [id number] Date of Birth: 1950/08/17  [facility]) CM/SW Contact:  Jeani Mill, RN Phone Number: 06/28/2023, 9:01 AM   Clinical Narrative:    [name] is stable to discharge home.  No TOC needs at this time.    Final next level of care: Home/Self Care Barriers to Discharge: Barriers Resolved   Patient Goals and CMS Choice Patient states their goals for this hospitalization and ongoing recovery are:: Return home          Discharge Placement             Home          Discharge Plan and Services Additional resources added to the After Visit Summary for                                       Social Drivers of Health (SDOH) Interventions SDOH Screenings   Food Insecurity: No Food Insecurity (06/27/2023)  Housing: Low Risk  (06/27/2023)  Transportation Needs: No Transportation Needs (06/27/2023)  Utilities: Not At Risk (06/27/2023)  Social Connections: Socially Integrated (06/27/2023)  Tobacco Use: Low Risk  (06/27/2023)     Readmission Risk Interventions     No data to display

## 2023-06-28 NOTE — TOC CM/SW Note (Signed)
 Transition of Care [facility]) - Inpatient Brief Assessment   Patient Details  Name: [name] MRN: [id number] Date of Birth: 1950/07/22  Transition of Care [facility]) CM/SW Contact:    Juliane Och, LCSW Phone Number: 06/28/2023, 9:14 AM   Clinical Narrative:  9:14 AM Per chart review, patient resides at home with spouse. Patient does not have HH history. Patient has SNF history with [facility]. Patient has DME history (3 in 1, rolling walker). No TOC needs were identified at this time. TOC will continue to follow and be available to assist.  Transition of Care Asessment: Insurance and Status: Insurance coverage has been reviewed Patient has primary care physician: Yes Home environment has been reviewed: Private Residence Prior level of function:: N/A Prior/Current Home Services: No current home services Social Drivers of Health Review: SDOH reviewed no interventions necessary Readmission risk has been reviewed: Yes Transition of care needs: no transition of care needs at this time

## 2023-06-28 NOTE — Plan of Care (Signed)

## 2023-06-28 NOTE — Care Management Obs Status (Signed)
 MEDICARE OBSERVATION STATUS NOTIFICATION   Patient Details  Name: ARISTOTELIS VILARDI MRN: [id number] Date of Birth: Mar 16, 1950   Medicare Observation Status Notification Given:       Wynonia Hedges 06/28/2023, 10:27 AM

## 2023-06-28 NOTE — Discharge Summary (Addendum)
 Discharge Summary   Patient ID: [name] MRN: [id number]; DOB: 11/11/1950  Admit date: 06/27/2023 Discharge date: 06/28/2023  PCP:  Tena Feeling, MD   [facility] Providers Cardiologist:  Dorothye Gathers, MD    Discharge Diagnoses  Principal Problem:   Atrial flutter [facility])  Diagnostic Studies/Procedures   N/a _____________   History of Present Illness   [name] is a 73 y.o. male with a hx of CAD s/p pLAD stent (residual RCA disease unable to stent), paroxysmal atrial fibrillation, DM, HTN, HLD, s/p right BKA who was seen 06/27/2023 for the evaluation of atrial flutter with RVR at the request of Dr. Nolia Baumgartner.   He was admitted 07/2022 with tachycardia and lightheadedness and found to be in atrial fibrillation with RVR.  He was treated with IV Lopressor  but developed junctional rhythm.  Also with mild chest discomfort and slight ST depression noted on EKG.  He underwent left heart cath at that time which showed 90% proximal LAD stenosis, tandem 99% proximal RCA stenosis.  Underwent successful PCI/DES x 1 to proximal LAD with attempted PCI of RCA but unable to cross wire.  Treated with triple therapy with aspirin , Plavix  and Eliquis  x 1 month.  He was also evaluated by EP that admission in the setting of sinus bradycardia with proximal atrial fibrillation.  It was felt he may need pacemaker in the future.  Echocardiogram that admission with LVEF of 60 to 65%, no regional wall motion abnormality, grade 1 diastolic dysfunction, normal RV function, no significant valvular disease.  He has been followed by Dr. Julio Ohm as an outpatient for his right BKA.  Last seen in the office 12/2018 for with Dr. Renna Cary and reported being in his usual state of health.  He was continued on Eliquis  5 mg twice daily as well as Plavix . Also noted a large benign polyp at recent visit with GI at the junction of the large and small intestine with plans for surgical removal pending blood thinner clearance.  Notes  indicate this was tentatively planned for July.   Presented to the ED on 6/4 with complaints of palpitations.  Reported that he woke up around 430 this morning and just felt " not right", somewhat jittery feeling.  Went to check his blood pressure and noted his heart rate was in the 130s.  Did have some minor chest discomfort.  No true palpitations.  Rechecked his blood pressure and heart rate about an hour later and noted his heart rate was still elevated.  Woke his wife up, and told her of his symptoms.  Presented to the ED.   Labs in the ED showed sodium 138, potassium 3.6, creatinine 0.98, high-sensitivity troponin 19, WBC 5.6, hemoglobin 13.7.  Chest x-ray with mild central congestion.  He was started on IV diltiazem with 10mg  bolus at 0830, rates remain in the 120s.  He reports full compliance with his home dose Eliquis .   He was admitted for further management.   Hospital Course    Atrial flutter with RVR -- presented with " jittery sensation", after noting his pulse rate was elevated the morning of admission when taking his blood pressure around 4:30 AM.  Denied any true palpitations. -- Initial EKG with atrial flutter, rates in the 120s noted on telemetry -- He had been fully compliant with his Eliquis  prior to admission -- Given diltiazem 10 mg x 1 and currently on IV diltiazem without much improvement in rates -- underwent DCCV while in the ED  with conversion to sinus rhythm, observed overnight without issues -- no rate controlling meds at this time with hx of junctional bradycardia -- will message for outpatient appt to follow up with EP    CAD s/p pLAD stent '24 -- did have some mild chest discomfort, but quickly resolved -- remains on plavix , statin   HTN -- continue losartan    HLD -- on statin   Elevated Troponin -- initial hsTn 19>>>99, repeat 1709 -- suspect arrhythmia related, no chest pain during admission   Patient was seen by Dr. [place] and deemed stable for  discharge home. Follow up arranged in the office.  _____________  Discharge Vitals Blood pressure (!) 147/78, pulse 60, temperature 98 F (36.7 C), temperature source Oral, resp. rate 15, height 6\' 8"  (2.032 m), weight 98.7 kg, SpO2 96%.  Filed Weights   06/27/23 0631 06/27/23 1600  Weight: 104.3 kg 98.7 kg    Labs & Radiologic Studies  CBC Recent Labs    06/27/23 0716 06/28/23 0215  WBC 5.6 6.7  HGB 13.7 12.3*  HCT 40.6 36.4*  MCV 94.0 96.3  PLT 181 169   Basic Metabolic Panel Recent Labs    16/10/96 0716 06/28/23 0215  NA 138 138  K 3.6 3.6  CL 107 106  CO2 23 24  GLUCOSE 153* 132*  BUN 15 18  CREATININE 0.98 1.38*  CALCIUM  8.7* 8.8*   Liver Function Tests No results for input(s): "AST", "ALT", "ALKPHOS", "BILITOT", "PROT", "ALBUMIN" in the last 72 hours. No results for input(s): "LIPASE", "AMYLASE" in the last 72 hours. High Sensitivity Troponin:   Recent Labs  Lab 06/27/23 0716 06/27/23 0816  TROPONINIHS 19* 99*    No results for input(s): "TRNPT" in the last 720 hours.  BNP Invalid input(s): "POCBNP" No results for input(s): "PROBNP" in the last 72 hours.  No results for input(s): "BNP" in the last 72 hours.  D-Dimer No results for input(s): "DDIMER" in the last 72 hours. Hemoglobin A1C No results for input(s): "HGBA1C" in the last 72 hours. Fasting Lipid Panel Recent Labs    06/28/23 0215  CHOL 98  HDL 30*  LDLCALC 53  TRIG 75  CHOLHDL 3.3   Lipoprotein (a)  Date/Time Value Ref Range Status  08/24/2022 12:58 AM 57.6 (H) <75.0 nmol/L Final    Comment:    (NOTE) Note:  Values greater than or equal to 75.0 nmol/L may       indicate an independent risk factor for CHD,       but must be evaluated with caution when applied       to non-Caucasian populations due to the       influence of genetic factors on Lp(a) across       ethnicities. Performed At: [facility] [address] [place], [place] [id number] Pearlean Botts MD  BJ:[id number]     Thyroid  Function Tests No results for input(s): "TSH", "T4TOTAL", "T3FREE", "THYROIDAB" in the last 72 hours.  Invalid input(s): "FREET3" _____________  DG Chest 2 View Result Date: 06/27/2023 CLINICAL DATA:  Chest pain EXAM: CHEST - 2 VIEW COMPARISON:  Chest x-ray performed September 08, 2022 FINDINGS: Mild hypoventilatory changes and prominence of central pulmonary vasculature. Heart mediastinum are grossly similar. No significant pleural effusion. No pneumothorax. IMPRESSION: 1. Low lung volumes with mild central congestion. Electronically Signed   By: Reagan Camera M.D.   On: 06/27/2023 07:13    Disposition Pt is being discharged home today in good condition.  Follow-up Plans &  Appointments  Discharge Instructions     Diet - low sodium heart healthy   Complete by: As directed        Discharge Medications Allergies as of 06/28/2023       Reactions   Other Other (See Comments)   Moxifloxacin Rash   Avelox Rash at injection site   Penicillins Rash   Has patient had a PCN reaction causing immediate rash, facial/tongue/throat swelling, SOB or lightheadedness with hypotension: #  #  #  YES  #  #  #  Has patient had a PCN reaction causing severe rash involving mucus membranes or skin necrosis: No Has patient had a PCN reaction that required hospitalization unknown Has patient had a PCN reaction occurring within the last 10 years: No If all of the above answers are "NO", then may proceed with Cephalosporin use.   Quinolones Itching   Sulfamethoxazole -trimethoprim  Itching, Rash, Other (See Comments)        Medication List     TAKE these medications    apixaban  5 MG Tabs tablet Commonly known as: ELIQUIS  Take 1 tablet (5 mg total) by mouth 2 (two) times daily.   atorvastatin  40 MG tablet Commonly known as: LIPITOR Take 1 tablet (40 mg total) by mouth at bedtime.   CALCIUM  CITRATE CHEWY BITE PO Take 500 mg by mouth 3 (three) times daily.   clopidogrel   75 MG tablet Commonly known as: PLAVIX  Take 1 tablet (75 mg total) by mouth daily with breakfast.   cyanocobalamin  1000 MCG tablet Commonly known as: VITAMIN B12 Take 1,000 mcg by mouth 3 (three) times a week. Mon, wed, Friday   famotidine  40 MG tablet Commonly known as: PEPCID  Take 40 mg by mouth at bedtime as needed for heartburn.   ferrous sulfate  325 (65 FE) MG tablet Take 325 mg by mouth See admin instructions. Take one tablet by mouth on Monday, Wednesday and Fridays   fluticasone  50 MCG/ACT nasal spray Commonly known as: FLONASE  Place 2 sprays into both nostrils daily as needed for allergies.   gabapentin  100 MG capsule Commonly known as: NEURONTIN  TAKE 1 CAPSULE BY MOUTH 4 TIMES A DAY WHEN NECESSARY FOR NEUROPATHY PAIN   GENTEAL SEVERE OP Place 1 drop into the right eye daily as needed (Dry eyes).   ipratropium 0.03 % nasal spray Commonly known as: ATROVENT Place 2 sprays into both nostrils 2 (two) times daily.   levothyroxine  125 MCG tablet Commonly known as: SYNTHROID  Take 1 tablet (125 mcg total) by mouth daily at 6 (six) AM.   losartan  25 MG tablet Commonly known as: COZAAR  Take 1 tablet (25 mg total) by mouth daily.   metFORMIN  500 MG 24 hr tablet Commonly known as: GLUCOPHAGE -XR Take 1,000 mg by mouth 2 (two) times daily.   multivitamin with minerals Tabs tablet Take 1 tablet by mouth in the morning and at bedtime. Gummies What changed: how much to take   nitroGLYCERIN  0.4 MG SL tablet Commonly known as: NITROSTAT  Place 1 tablet (0.4 mg total) under the tongue every 5 (five) minutes x 3 doses as needed for chest pain.   Omega 3 1000 MG Caps Take 1,000 mg by mouth daily.   PreserVision AREDS 2 Caps Take 1 capsule by mouth daily.   tadalafil 10 MG tablet Commonly known as: CIALIS Take 5 mg by mouth daily as needed for erectile dysfunction. Pt takes 1-2 tablets as needed.   tiZANidine  2 MG tablet Commonly known as: ZANAFLEX  Take 1 tablet (2 mg  total) by mouth every 8 (eight) hours as needed for muscle spasms.   Valerian Root 450 MG Caps Take 450 mg by mouth at bedtime.         Outstanding Labs/Studies  N/a  Duration of Discharge Encounter: APP Time: 15 minutes   Signed, Johnie Nailer, NP 06/28/2023, 8:47 AM

## 2023-07-01 NOTE — Progress Notes (Signed)
 " Cardiology Office Note    Patient Name: [name] Date of Encounter: 07/02/2023  Primary Care Provider:  Dwight Trula SQUIBB, MD Primary Cardiologist:  Oneil Parchment, MD Primary Electrophysiologist: None   Past Medical History    Past Medical History:  Diagnosis Date   Anemia    low iron   Arthritis    Coronary artery disease 2006   a. remote stenting to ramus, prox LAD x2.   Dental crowns present    Diabetes mellitus    First degree AV block    GERD (gastroesophageal reflux disease)    Heart attack (HCC) 06/2004   Heart murmur    aortic   Hx MRSA infection 02/2006   Hyperlipidemia    Hypertension    hx. of - has not been on med. since losing wt. after gastric bypass   Hypothyroidism    Morbid obesity (HCC)    Mucoid cyst of joint 09/2011   left thumb   Sleep apnea    sleep study 03/29/2011; no CPAP use, lost >130lbs   Trifascicular block     History of Present Illness  [name] is a 73 y.o. male with PMH of CAD MI 2006 s/p DES to ramus, proximal LAD x2 with diffuse disease in distal RCA, chronic diastolic CHF, ascending aortic aneurysm, aortic stenosis GERD, trifascicular block (first-degree AV block,RBBB, LAFB), paroxysmal AF (on Eliquis ) morbid obesity s/p gastric bypass 2012, HLD, hypothyroidism, sleep apnea, Right BKA  and DM type II who presents today for posthospital follow-up.  [name] was last seen in our office on 12/28/2022 by Dr. Parchment.  During visit he was doing well with no new cardiac complaints.  He was evaluated by EP for trifascicular block who did not feel pacemaker was indicated at that time.  He was seen by Dr. Kerrin for follow-up of ascending aortic aneurysm.  He completed an annual CT on January 2025 showing stable 5 cm aneurysm with recommended semiannual imaging.  Indication for surgery would be 5.5 cm with repeat testing scheduled in 6 months.  He was noted to have elevated BP during office visit and was advised to continue to monitor with indications  for further titration if systolics remain above 140. He presented to the ED on 06/27/2023 with complaint of palpitations and EKG completed showing atrial flutter with RVR.  He reported a jittery sensation but denied any true palpitations.  He was recommended to proceed with DCCV due to elevated heart rate following Cardizem  administration and compliance with Eliquis .  He was also found to have elevated troponin 19>>>99, repeat 1709.  He completed DCCV and converted to sinus with maintaining rhythm prior to discharge.  Troponin elevation was felt to be secondary to arrhythmia with no complaint of chest pain during admission.  [name] presents today with his wife for posthospital follow-up.He was hospitalized on June 4th due to an episode of atrial flutter. He awoke at 4:15 AM feeling unwell, with a pulse of 137 and a blood pressure of 119/60. No chest pain, shortness of breath, dizziness, or numbness were present, but he felt 'strange'. Previously, he experienced atrial fibrillation with a heart rate over 200, but this episode felt different. He was treated in the emergency department with medication. He has a history of atrial fibrillation and atrial flutter, with the last episode of atrial fibrillation occurring nearly a year ago. He is currently on Eliquis  and Plavix , which were started after a colonoscopy on July 29th revealed a polyp that  could not be removed due to his heart condition. He is awaiting further instructions on when he can discontinue these medications to proceed with polyp removal. He has a history of coronary artery disease, including a myocardial infarction, and is aware of his heart blocks, including a fascicular block and bundle branch block. He monitors his blood pressure closely due to an aneurysm, which was stable six months ago. He is scheduled for a follow-up CT scan this month. He is currently taking Losartan  for blood pressure management and monitors his blood pressure regularly at  home, with a reading of 138/70 this morning. He is considering a vacation to [facility] in July but is concerned about the potential recurrence of atrial flutter. He plans to manage potential triggers such as alcohol, caffeine, dehydration, and stress to prevent recurrence. He has a prosthetic leg and is able to walk, climb stairs, and perform household activities, although he does not run. He lives in a condo and does not engage in yard work but is able to refill bird feeders and grill outdoors. Patient denies chest pain, palpitations, dyspnea, PND, orthopnea, nausea, vomiting, dizziness, syncope, edema, weight gain, or early satiety.  Discussed the use of AI scribe software for clinical note transcription with the patient, who gave verbal consent to proceed.  History of Present Illness   Review of Systems  Please see the history of present illness.    All other systems reviewed and are otherwise negative except as noted above.  Physical Exam     Wt Readings from Last 3 Encounters:  07/02/23 224 lb 12.8 oz (102 kg)  06/27/23 217 lb 9.5 oz (98.7 kg)  02/13/23 223 lb (101.2 kg)   VS: Vitals:   07/02/23 0916 07/02/23 1013  BP: (!) 142/70 (!) 148/68  Pulse: (!) 53   SpO2: 99%   ,Body mass index is 24.7 kg/m. GEN: Well nourished, well developed in no acute distress Neck: No JVD; No carotid bruits Pulmonary: Clear to auscultation without rales, wheezing or rhonchi  Cardiovascular: Normal rate. Regular rhythm. Normal S1. Normal S2.   Murmurs: There is no murmur.  ABDOMEN: Soft, non-tender, non-distended EXTREMITIES:  No edema; No deformity   EKG/LABS/ Recent Cardiac Studies   ECG personally reviewed by me today -sinus bradycardia with first-degree AVB and bifascicular block with rate of 53 bpm and no acute changes consistent with previous EKG.  Risk Assessment/Calculations:    CHA2DS2-VASc Score = 4   This indicates a 4.8% annual risk of stroke. The patient's score is based  upon: CHF History: 0 HTN History: 1 Diabetes History: 1 Stroke History: 0 Vascular Disease History: 1 Age Score: 1 Gender Score: 0         Lab Results  Component Value Date   WBC 6.7 06/28/2023   HGB 12.3 (L) 06/28/2023   HCT 36.4 (L) 06/28/2023   MCV 96.3 06/28/2023   PLT 169 06/28/2023   Lab Results  Component Value Date   CREATININE 1.38 (H) 06/28/2023   BUN 18 06/28/2023   NA 138 06/28/2023   K 3.6 06/28/2023   CL 106 06/28/2023   CO2 24 06/28/2023   Lab Results  Component Value Date   CHOL 98 06/28/2023   HDL 30 (L) 06/28/2023   LDLCALC 53 06/28/2023   TRIG 75 06/28/2023   CHOLHDL 3.3 06/28/2023    Lab Results  Component Value Date   HGBA1C 6.7 (H) 08/24/2022   Assessment & Plan    Assessment & Plan  1.Paroxysmal  atrial fibrillation: -Patient recently admitted with atrial flutter with RVR and today is in sinus rhythm. -Recent episode resolved with cardioversion, sinus rhythm confirmed. Discussed triggers, recurrence risk, and monitoring options. Explained pacemaker and ablation procedures. - Discuss potential use of KardiaMobile or Apple Watch for home heart rhythm monitoring. - Follow-up with Dr. Nancey on July 7th to discuss potential ablation or pacemaker. - Provide surgical clearance for potential procedures in the next three months.  2.Coronary artery disease: -s/p NSTEMI with LHC completed showing severe two-vessel CAD with 90% proximal LAD stenosis and tandem stenosis of 99% in proximal mid RCA.  PCI to proximal LAD to perform PCI of RCA due to wire not compliant. -Today patient reports no chest pain or angina since hospitalization. - Continue current GDMT with Lipitor 40 mg, Plavix  75 mg, as needed Nitrostat  0.4 mg  3.  Essential hypertension: .HYPERTENSION CONTROL Vitals:   07/02/23 0916 07/02/23 1013  BP: (!) 142/70 (!) 148/68    The patient's blood pressure is elevated above target today.  In order to address the patient's elevated  BP: Blood pressure will be monitored at home to determine if medication changes need to be made.     -Continue losartan  25 mg with plan to possibly increase if numbers remain elevated after home log.   4.  Trifascicular block: - Currently stable and asymptomatic with patient scheduled to follow-up with EP in July. - Continue to avoid AV nodal agents.  5.  Ascending aortic dilation: -Follow-up imaging planned, no new symptoms reported. - Schedule CT scan for aneurysm follow-up.  6. Preop Clearance: - Patient's RCRI score is 11% - He reports possible surgical procedures in the next 3 months - He is aware that Eliquis  cannot be held for the next 6 to 8 weeks.  The patient affirms he has been doing well without any new cardiac symptoms. They are able to achieve 5 METS without cardiac limitations. Therefore, based on ACC/AHA guidelines, the patient would be at acceptable risk for the planned procedure without further cardiovascular testing. The patient was advised that if he develops new symptoms prior to surgery to contact our office to arrange for a follow-up visit, and he verbalized understanding.   Disposition: Follow-up with Oneil Parchment, MD or APP in 6 months    Signed, Wyn Raddle, Jackee Shove, NP 07/02/2023, 11:16 AM [facility] "

## 2023-07-02 ENCOUNTER — Ambulatory Visit: Payer: Medicare Other | Attending: Internal Medicine | Admitting: Nurse Practitioner

## 2023-07-02 ENCOUNTER — Encounter: Payer: Self-pay | Admitting: Nurse Practitioner

## 2023-07-02 ENCOUNTER — Ambulatory Visit: Payer: Medicare Other | Admitting: Nurse Practitioner

## 2023-07-02 VITALS — BP 148/68 | HR 53 | Ht >= 80 in | Wt 224.8 lb

## 2023-07-02 DIAGNOSIS — I2510 Atherosclerotic heart disease of native coronary artery without angina pectoris: Secondary | ICD-10-CM | POA: Insufficient documentation

## 2023-07-02 DIAGNOSIS — I480 Paroxysmal atrial fibrillation: Secondary | ICD-10-CM | POA: Insufficient documentation

## 2023-07-02 DIAGNOSIS — I7121 Aneurysm of the ascending aorta, without rupture: Secondary | ICD-10-CM | POA: Diagnosis not present

## 2023-07-02 DIAGNOSIS — I10 Essential (primary) hypertension: Secondary | ICD-10-CM | POA: Diagnosis present

## 2023-07-02 DIAGNOSIS — Z01810 Encounter for preprocedural cardiovascular examination: Secondary | ICD-10-CM

## 2023-07-02 DIAGNOSIS — I453 Trifascicular block: Secondary | ICD-10-CM | POA: Insufficient documentation

## 2023-07-02 NOTE — Patient Instructions (Signed)
 Medication Instructions:  Your physician recommends that you continue on your current medications as directed. Please refer to the Current Medication list given to you today. *If you need a refill on your cardiac medications before your next appointment, please call your pharmacy*  Lab Work: None ordered If you have labs (blood work) drawn today and your tests are completely normal, you will receive your results only by: MyChart Message (if you have MyChart) OR A paper copy in the mail If you have any lab test that is abnormal or we need to change your treatment, we will call you to review the results.  Testing/Procedures: None ordered  Follow-Up: At [facility], you and your health needs are our priority.  As part of our continuing mission to provide you with exceptional heart care, our providers are all part of one team.  This team includes your primary Cardiologist (physician) and Advanced Practice Providers or APPs (Physician Assistants and Nurse Practitioners) who all work together to provide you with the care you need, when you need it.  Your next appointment:   6 month(s)  Provider:   Dorothye Gathers, MD or Charles Connor, NP   We recommend signing up for the patient portal called "MyChart".  Sign up information is provided on this After Visit Summary.  MyChart is used to connect with patients for Virtual Visits (Telemedicine).  Patients are able to view lab/test results, encounter notes, upcoming appointments, etc.  Non-urgent messages can be sent to your provider as well.   To learn more about what you can do with MyChart, go to [email or web address].   Other Instructions Check your blood pressure daily for 1 week, then contact the office with your readings.  Contact the office either by phone or MyChart with your readings.  Make sure to check your blood pressure 2 hours after taking your medications.   AVOID these things for 30 minutes before checking your blood  pressure: No Drinking caffeine. No Drinking alcohol. No Eating. No Smoking. No Exercising.  Five minutes before checking your blood pressure: Pee. Sit in a dining chair. Avoid sitting in a soft couch or armchair. Be quiet. Do not talk.

## 2023-07-09 DIAGNOSIS — I10 Essential (primary) hypertension: Secondary | ICD-10-CM | POA: Diagnosis not present

## 2023-07-09 DIAGNOSIS — I4892 Unspecified atrial flutter: Secondary | ICD-10-CM | POA: Diagnosis not present

## 2023-07-09 DIAGNOSIS — N179 Acute kidney failure, unspecified: Secondary | ICD-10-CM | POA: Diagnosis not present

## 2023-07-09 DIAGNOSIS — K635 Polyp of colon: Secondary | ICD-10-CM | POA: Diagnosis not present

## 2023-07-17 NOTE — Telephone Encounter (Signed)
 Spoke with patient, low heart rates (high 30's and 40's) occurring during the night while sleeping, patient asymptomatic and unaware. Patient states this has not occurred during the day. Instructed patient to keep appointment with Dr Nancey, no changes needed at this time. No further needs.

## 2023-07-18 ENCOUNTER — Ambulatory Visit (INDEPENDENT_AMBULATORY_CARE_PROVIDER_SITE_OTHER): Admitting: Orthopaedic Surgery

## 2023-07-18 DIAGNOSIS — M25511 Pain in right shoulder: Secondary | ICD-10-CM | POA: Diagnosis not present

## 2023-07-18 DIAGNOSIS — G8929 Other chronic pain: Secondary | ICD-10-CM | POA: Diagnosis not present

## 2023-07-18 NOTE — Progress Notes (Signed)
 The patient is well-known to us .  He 73 years old and we did perform arthroscopic surgery on his right shoulder back in 2022 for rotator cuff tear that was full-thickness but not retracted.  We were able to perform a repair of the supraspinatus tendon.  He has had a recent fall.  He is someone who has a below-knee amputation with a prosthesis and this was just an accidental fall but he did land on his right shoulder.  X-rays recently obtained of the right shoulder showed the humeral head was in good position and not high riding within the glenohumeral joint.  He does have significant AC joint arthritis.  He was seen by one of the PAs who did place a subacromial steroid injection in the shoulder and did help some.  He still has some pain that wakes him up at night and different motions can cause him some pain with the right shoulder.  On my exam he does use some of his deltoid to abduct his shoulder but it seems like a lot of his pain may be AC joint related.  I would actually like to send him to Dr. Burnetta for an ultrasound assessment of the right shoulder and most likely a steroid injection in the right shoulder AC joint.  A lot of that will depend on Dr. Burnetta assessment and the patient does agree to this treatment plan and referral.

## 2023-07-19 ENCOUNTER — Other Ambulatory Visit: Payer: Self-pay

## 2023-07-19 ENCOUNTER — Ambulatory Visit: Admitting: Sports Medicine

## 2023-07-19 ENCOUNTER — Encounter: Payer: Self-pay | Admitting: Sports Medicine

## 2023-07-19 DIAGNOSIS — G8929 Other chronic pain: Secondary | ICD-10-CM

## 2023-07-19 DIAGNOSIS — M19011 Primary osteoarthritis, right shoulder: Secondary | ICD-10-CM | POA: Diagnosis not present

## 2023-07-19 DIAGNOSIS — M25511 Pain in right shoulder: Secondary | ICD-10-CM

## 2023-07-19 MED ORDER — LIDOCAINE HCL 1 % IJ SOLN
0.5000 mL | INTRAMUSCULAR | Status: AC | PRN
  Administered 2023-07-19: .5 mL

## 2023-07-19 MED ORDER — METHYLPREDNISOLONE ACETATE 40 MG/ML IJ SUSP
40.0000 mg | INTRAMUSCULAR | Status: AC | PRN
  Administered 2023-07-19: 40 mg via INTRA_ARTICULAR

## 2023-07-19 NOTE — Progress Notes (Signed)
 [name] - 73 y.o. male MRN [id number]  Date of birth: 09/06/1950  Office Visit Note: Visit Date: 07/19/2023 PCP: Dwight Trula SQUIBB, MD Referred by: Dwight Trula SQUIBB, MD  Subjective: Chief Complaint  Patient presents with   Right Shoulder - Pain   HPI: [name] is a very pleasant 73 y.o. male who presents today for acute on chronic right shoulder pain.  [name] unfortunately sustained an accidental fall and did land on the right shoulder.  He was seen initially by [facility] persons and did have a subacromial joint injection which gave him only about 25% relief of his pain.  He is a longtime patient of Dr. Vernetta who did perform arthroscopic surgery with rotator cuff repair of the supraspinatus back in 2022.  He recommended evaluation and possible injection by myself today.  [name] states that most of his pain is right over the top of the shoulder, this is worse with reaching overhead as well as abduction.  He denies any radicular symptoms going down the arm.  Pertinent ROS were reviewed with the patient and found to be negative unless otherwise specified above in HPI.   Assessment & Plan: Visit Diagnoses:  1. Osteoarthritis of right AC (acromioclavicular) joint   2. Chronic right shoulder pain    Plan: Impression is right shoulder pain after a fall that seem to exacerbate his AC joint osteoarthritis.  Through shared decision making, did proceed with ultrasound-guided right AC joint injection, patient tolerated well.  Advised on postinjection protocol.  He may use ice/heat or Tylenol  for any postinjection pain.  After 48 hours, he may return to activity and I would like him then to see over the next 10-14 days what degree of relief he receives from this.  I do believe this will fully settle down his pain to a large extent.  If for some reason he is still having issues after 2 weeks, he will let myself and/or Dr. Vernetta know.  We could consider initial evaluation with ultrasound of the shoulder  and rotator cuff versus MRI. [name] will keep us  updated.  Follow-up: Return if symptoms worsen or fail to improve.   Meds & Orders: No orders of the defined types were placed in this encounter.   Orders Placed This Encounter  Procedures   Medium Joint Inj   US  Guided Needle Placement - No Linked Charges     Procedures: Medium Joint Inj: R acromioclavicular on 07/19/2023 5:41 PM Indications: pain Details: 25 G 1.5 in needle, ultrasound-guided anterior approach Medications: 0.5 mL lidocaine  1 %; 40 mg methylPREDNISolone  acetate 40 MG/ML  US -guided AC Joint injection, Right shoulder After discussion on risks/benefits/indications, informed verbal consent was obtained. A timeout was then performed. The patient was seated in examination room. The area overlying the [facility] joint of the shoulder was prepped with Chloraprep and alcohol swabs then utilizing ultrasound guidance, patient's AC joint was injected using a 25G, 1.5 needle with 0.5:1.41mL lidocaine :depomedrol of injectate via an out-of-plane, walk-down approach. Visualization of injectate flow was noted under ultrasound guidance. Patient tolerated the procedure well without immediate complications.   Procedure, treatment alternatives, risks and benefits explained, specific risks discussed. Consent was given by the patient. Immediately prior to procedure a time out was called to verify the correct patient, procedure, equipment, support staff and site/side marked as required. Patient was prepped and draped in the usual sterile fashion.          Clinical History: No specialty comments available.  He reports that he has never smoked. He has never used smokeless tobacco.  Recent Labs    08/24/22 0058  HGBA1C 6.7*    Objective:    Physical Exam  Gen: Well-appearing, in no acute distress; non-toxic CV: Well-perfused. Warm.  Resp: Breathing unlabored on room air; no wheezing. Psych: Fluid speech in conversation; appropriate affect;  normal thought process  Ortho Exam - Right shoulder: + TTP overlying the right AC joint.  There is near full and equivocal active and passive range of motion of the shoulders.  Negative empty can, negative drop arm test.  There is good with rotator cuff testing.  Positive crossarm adduction test, positive scarf's test.  Imaging:  *Three-view right shoulder x-ray from 05/24/2023 was independently reviewed and interpreted by myself today.  AP, scapular Y and axial view was reviewed.  X-rays demonstrate a well-seated humeral head without any significant arthritic change within the glenohumeral joint.  There is moderate to severe narrowing with arthritic change about the [facility] joint  Past Medical/Family/Surgical/Social History: Medications & Allergies reviewed per EMR, new medications updated. Patient Active Problem List   Diagnosis Date Noted   Atrial flutter (HCC) 06/27/2023   Paroxysmal atrial fibrillation with RVR (HCC) 08/23/2022   Ascending aortic aneurysm (HCC) 08/23/2022   Bradycardia 08/22/2022   NSTEMI (non-ST elevated myocardial infarction) (HCC) 08/22/2022   Atrial fibrillation with RVR (HCC) 08/22/2022   Hypotension 01/07/2021   S/P arthroscopy of right shoulder 06/02/2020   Closed fracture of neck of right femur (HCC)    Hip fracture (HCC) 03/27/2020   Nontraumatic complete tear of right rotator cuff 02/26/2020   S/P transmetatarsal amputation of foot, left (HCC) 04/25/2016   Chronic osteomyelitis of toe, left (HCC)    Chronic diastolic CHF (congestive heart failure) (HCC) 09/25/2015   Aortic stenosis 08/04/2014   Diabetic foot infection (HCC) 06/03/2014   Peripheral vascular disease (HCC) 06/03/2014   Coronary artery disease involving native coronary artery of native heart without angina pectoris 07/29/2013   Trifascicular block 07/29/2013   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 07/26/2011   Hypothyroidism 01/22/2007   Type 2 diabetes mellitus with vascular disease (HCC)  01/22/2007   HYPERLIPIDEMIA, MIXED 01/22/2007   MYOCARDIAL INFARCTION, HX OF 01/22/2007   ANEMIA, IRON DEFICIENCY, HX OF 01/22/2007   Past Medical History:  Diagnosis Date   Anemia    low iron   Arthritis    Coronary artery disease 2006   a. remote stenting to ramus, prox LAD x2.   Dental crowns present    Diabetes mellitus    First degree AV block    GERD (gastroesophageal reflux disease)    Heart attack (HCC) 06/2004   Heart murmur    aortic   Hx MRSA infection 02/2006   Hyperlipidemia    Hypertension    hx. of - has not been on med. since losing wt. after gastric bypass   Hypothyroidism    Morbid obesity (HCC)    Mucoid cyst of joint 09/2011   left thumb   Sleep apnea    sleep study 03/29/2011; no CPAP use, lost >130lbs   Trifascicular block    Family History  Problem Relation Age of Onset   Heart disease Mother    Past Surgical History:  Procedure Laterality Date   AMPUTATION Left 04/19/2016   Procedure: Left Foot 4th Toe Amputation vs. Transmetatarsal;  Surgeon: Jerona Harden GAILS, MD;  Location: [facility] OR;  Service: Orthopedics;  Laterality: Left;   BIOPSY  04/07/2019  Procedure: BIOPSY;  Surgeon: Elicia Claw, MD;  Location: [facility];  Service: Gastroenterology;;   CATARACT EXTRACTION  02/2010; 03/2010   COLONOSCOPY WITH PROPOFOL  N/A 09/14/2014   Procedure: COLONOSCOPY WITH PROPOFOL ;  Surgeon: Gladis MARLA Louder, MD;  Location: [facility];  Service: Endoscopy;  Laterality: N/A;   COLONOSCOPY WITH PROPOFOL  N/A 04/07/2019   Procedure: COLONOSCOPY WITH PROPOFOL ;  Surgeon: Elicia Claw, MD;  Location: [facility];  Service: Gastroenterology;  Laterality: N/A;   CORONARY ANGIOPLASTY WITH STENT PLACEMENT  07/12/2004; 08/03/2004   total of 3 stents   CORONARY STENT INTERVENTION N/A 08/23/2022   Procedure: CORONARY STENT INTERVENTION;  Surgeon: Swaziland, Peter M, MD;  Location[facility] INVAS[facility] CV LAB;  Service: Cardiovascular;  Laterality: N/A;   I & D EXTREMITY Right 06/04/2014    Procedure: IRRIGATION AND DEBRIDEMENT EXTREMITY;  Surgeon: Lonni CINDERELLA Poli, MD;  Location: [facility] OR;  Service: Orthopedics;  Laterality: Right;   LEFT HEART CATH AND CORONARY ANGIOGRAPHY N/A 08/23/2022   Procedure: LEFT HEART CATH AND CORONARY ANGIOGRAPHY;  Surgeon: Swaziland, Peter M, MD;  Location: [facility] INVASIVE CV LAB;  Service: Cardiovascular;  Laterality: N/A;   MASS EXCISION  10/04/2011   Procedure: EXCISION MASS;  Surgeon: Arley JONELLE Curia, MD;  Location: [facility];  Service: Orthopedics;  Laterality: Left;  excision cyst debridment ip joint of left thumb   PROSTATECTOMY     right below knee amputation  2018   ROUX-EN-Y GASTRIC BYPASS  10/10/2010   laparoscopic   SHOULDER ARTHROSCOPY WITH ROTATOR CUFF REPAIR AND SUBACROMIAL DECOMPRESSION Right 03/23/2020   Procedure: RIGHT SHOULDER ARTHROSCOPY WITH DEBRIDEMENT AND  ROTATOR CUFF REPAIR;  Surgeon: Poli Lonni CINDERELLA, MD;  Location: [facility] OR;  Service: Orthopedics;  Laterality: Right;   TOE AMPUTATION  02/23/2006   left foot second ray amputation   TOTAL HIP ARTHROPLASTY Right 03/29/2020   Procedure: TOTAL HIP ARTHROPLASTY POSTERIOR APPROACH;  Surgeon: Jerri Kay HERO, MD;  Location: [facility] OR;  Service: Orthopedics;  Laterality: Right;   TRANSMETATARSAL AMPUTATION Left    TRIGGER FINGER RELEASE Right 09/16/2019   Procedure: RELEASE TRIGGER FINGER/A-1 PULLEY;  Surgeon: Curia Arley, MD;  Location: [facility];  Service: Orthopedics;  Laterality: Right;  IV REGIONAL FOREARM BLOCK   Social History   Occupational History   Occupation: IT trainer taxes  Tobacco Use   Smoking status: Never   Smokeless tobacco: Never  Substance and Sexual Activity   Alcohol use: No   Drug use: No   Sexual activity: Yes

## 2023-07-19 NOTE — Progress Notes (Signed)
 Patient says that he fell recently and landed on the right side of his body and right shoulder. He says that he had a rotator cuff repair in 2022 with Dr. Vernetta. He points towards the top of his shoulder when describing his pain. He does have pain with overhead movements, reaching behind his back, and horizontal adduction. He denies any popping or symptoms down the arm. He got some relief from a subacromial injection, but not complete relief.

## 2023-07-30 ENCOUNTER — Ambulatory Visit: Attending: Cardiovascular Disease | Admitting: Cardiovascular Disease

## 2023-07-30 ENCOUNTER — Encounter: Payer: Self-pay | Admitting: Cardiovascular Disease

## 2023-07-30 VITALS — BP 130/80 | HR 64 | Ht >= 80 in | Wt 219.0 lb

## 2023-07-30 DIAGNOSIS — I480 Paroxysmal atrial fibrillation: Secondary | ICD-10-CM | POA: Insufficient documentation

## 2023-07-30 DIAGNOSIS — Z01812 Encounter for preprocedural laboratory examination: Secondary | ICD-10-CM | POA: Diagnosis not present

## 2023-07-30 DIAGNOSIS — I453 Trifascicular block: Secondary | ICD-10-CM | POA: Insufficient documentation

## 2023-07-30 DIAGNOSIS — R002 Palpitations: Secondary | ICD-10-CM | POA: Diagnosis not present

## 2023-07-30 NOTE — Patient Instructions (Signed)
 Medication Instructions:  Your physician recommends that you continue on your current medications as directed. Please refer to the Current Medication list given to you today.  *If you need a refill on your cardiac medications before your next appointment, please call your pharmacy*  Lab Work: CBC and BMET - please have pre-procedure lab work completed on Monday 09/17/2023. This can be done at ANY [facility] near you - no appointment required and this does not have to be fasting. If you have labs (blood work) drawn today and your tests are completely normal, you will receive your results only by: MyChart Message (if you have MyChart) OR A paper copy in the mail If you have any lab test that is abnormal or we need to change your treatment, we will call you to review the results.  Testing/Procedures: Cardiac CT - someone will contact you to schedule this  Your physician has requested that you have cardiac CT. Cardiac computed tomography (CT) is a painless test that uses an x-ray machine to take clear, detailed pictures of your heart. For further information please visit [email or web address] Please follow instruction sheet as given.   Atrial Fibrillation Ablation - scheduled on Thursday, October 11, 2023.  We will be in contact closer to your ablation date with further instructions Your physician has recommended that you have an ablation. Catheter ablation is a medical procedure used to treat some cardiac arrhythmias (irregular heartbeats). During catheter ablation, a long, thin, flexible tube is put into a blood vessel in your groin (upper thigh), or neck. This tube is called an ablation catheter. It is then guided to your heart through the blood vessel. Radio frequency waves destroy small areas of heart tissue where abnormal heartbeats may cause an arrhythmia to start. Please see the instruction sheet given to you today.  Follow-Up: At [facility], you and your health needs are our  priority.  As part of our continuing mission to provide you with exceptional heart care, our providers are all part of one team.  This team includes your primary Cardiologist (physician) and Advanced Practice Providers or APPs (Physician Assistants and Nurse Practitioners) who all work together to provide you with the care you need, when you need it.  Your next appointment:   We will schedule follow up after your ablation  Provider:   Dr Nancey   Cardiac Ablation Cardiac ablation is a procedure to destroy, or ablate, a small amount of heart tissue that is causing problems. The heart has many electrical connections. Sometimes, these connections are abnormal and can cause the heart to beat very fast or irregularly. Ablating the abnormal areas can improve the heart's rhythm or return it to normal. Ablation may be done for people who: Have irregular or rapid heartbeats (arrhythmias). Have Wolff-Parkinson-White syndrome. Have taken medicines for an arrhythmia that did not work or caused side effects. Have a high-risk heartbeat that may be life-threatening. Tell a health care provider about: Any allergies you have. All medicines you are taking, including vitamins, herbs, eye drops, creams, and over-the-counter medicines. Any problems you or family members have had with anesthesia. Any bleeding problems you have. Any surgeries you have had. Any medical conditions you have. Whether you are pregnant or may be pregnant. What are the risks? Your health care provider will talk with you about risks. These may include: Infection. Bruising and bleeding. Stroke or blood clots. Damage to nearby structures or organs. Allergic reaction to medicines or dyes. Needing a pacemaker if the heart gets damaged.  A pacemaker is a device that helps the heart beat normally. Failure of the procedure. A repeat procedure may be needed. What happens before the procedure? Medicines Ask your health care provider  about: Changing or stopping your regular medicines. These include any heart rhythm medicines, diabetes medicines, or blood thinners you take. Taking medicines such as aspirin  and ibuprofen. These medicines can thin your blood. Do not take them unless your health care provider tells you to. Taking over-the-counter medicines, vitamins, herbs, and supplements. General instructions Follow instructions from your health care provider about what you may eat and drink. If you will be going home right after the procedure, plan to have a responsible adult: Take you home from the hospital or clinic. You will not be allowed to drive. Care for you for the time you are told. Ask your health care provider what steps will be taken to prevent infection. What happens during the procedure?  An IV will be inserted into one of your veins. You may be given: A sedative. This helps you relax. Anesthesia. This will: Numb certain areas of your body. An incision will be made in your neck or your groin. A needle will be inserted through the incision and into a large vein in your neck or groin. The small, thin tube (catheter) will be inserted through the needle and moved to your heart. A type of X-ray (fluoroscopy) will be used to help guide the catheter and provide images of the heart on a monitor. Dye may be injected through the catheter to help your surgeon see the area of the heart that needs treatment. Electrical currents will be sent from the catheter to destroy heart tissue in certain areas. There are three types of energy that may be used to do this: Heat (radiofrequency energy). Laser energy. Extreme cold (cryoablation). When the tissue has been destroyed, the catheter will be removed. Pressure will be held on the insertion area to prevent bleeding. A bandage (dressing) will be placed over the insertion area. The procedure may vary among health care providers and hospitals. What happens after the  procedure? Your blood pressure, heart rate and rhythm, breathing rate, and blood oxygen level will be monitored until you leave the hospital or clinic. Your insertion area will be checked for bleeding. You will need to lie still for a few hours. If your groin was used, you will need to keep your leg straight for a few hours after the catheter is removed. This information is not intended to replace advice given to you by your health care provider. Make sure you discuss any questions you have with your health care provider. Document Revised: 06/28/2021 Document Reviewed: 06/28/2021 Elsevier Patient Education  2024 [employer].

## 2023-07-30 NOTE — Progress Notes (Signed)
 Electrophysiology Office Note:    Date:  07/30/2023   ID:  [name], DOB [birth year], MRN [id number]  PCP:  Dwight Trula SQUIBB, MD   [facility] Providers Cardiologist:  Oneil Parchment, MD     Referring MD: Dwight Trula SQUIBB, MD   History of Present Illness:    [name] is a 73 y.o. male with a medical history significant for atrial fibrillation, bradycardia coronary disease status post LAD stent, diabetes, hypertension referred for arrhythmia management.      Discussed the use of AI scribe software for clinical note transcription with the patient, who gave verbal consent to proceed.  History of Present Illness [name] is a 73 year old male with atrial fibrillation, coronary artery disease, and hypertension who presents for rhythm management.  He was sent to the ER on June 27, 2023, with atrial flutter and underwent DC cardioversion. He has a history of coronary artery disease and underwent PCI to the proximal LAD. A PCI attempt on the RCA was unsuccessful due to inability to cross the lesion with the wire.  He awoke on June 27, 2023, feeling jittery with a heart rate in the 130s. A TTE on August 23, 2022, showed an LVEF of 60-65%, grade one diastolic dysfunction, mild aortic root dilation at 41 mm, and normal-sized atria.  He has episodes of heart rates below 40 during the night, although he has been regular. He has been hospitalized or in the ER multiple times due to abnormal heart rhythms.  He is currently on Eliquis  and Plavix , having been switched from 81 mg aspirin  to Plavix  and Eliquis  in early August last year after a new stent placement.         Today, he reports he feels well and has no complaints.  EKGs/Labs/Other Studies Reviewed Today:     Echocardiogram:  TTE August 23, 2022 LVEF 60 to 65%.  Grade 1 diastolic dysfunction.  Mild aortic root dilation, 41 mm atria are normal in size   Cardiac catherization  Coronary angiogram 08/23/2022 Reviewed in epic.   Multiple lesions noted  EKG:         Physical Exam:    VS:  BP 130/80 (BP Location: Right Arm, Patient Position: Sitting, Cuff Size: Large)   Pulse 64   Ht 6' 8 (2.032 m)   Wt 219 lb (99.3 kg)   SpO2 96%   BMI 24.06 kg/m     Wt Readings from Last 3 Encounters:  07/30/23 219 lb (99.3 kg)  07/02/23 224 lb 12.8 oz (102 kg)  06/27/23 217 lb 9.5 oz (98.7 kg)     GEN: Well nourished, well developed in no acute distress CARDIAC: RRR, no murmurs, rubs, gallops RESPIRATORY:  Normal work of breathing MUSCULOSKELETAL: no edema    ASSESSMENT & PLAN:     Atrial fibrillation Paroxysmal, has had ER visits with RVR Rates difficult to control We discussed management options.  Using a shared decision making approach we have opted to proceed with an early invasive strategy.  We discussed the indication, rationale, logistics, anticipated benefits, and potential risks of the ablation procedure including but not limited to -- bleed at the groin access site, chest pain, damage to nearby organs such as the diaphragm, lungs, or esophagus, need for a drainage tube, or prolonged hospitalization. I explained that the risk for stroke, heart attack, need for open chest surgery, or even death is very low but not zero. he  expressed understanding and wishes to  proceed.   Atrial flutter Will plan for CTI line at the time of ablation  Tachybradycardia syndrome With bifascicular block Need to avoid AV nodal blocking agents    Signed, Eulas FORBES Furbish, MD  07/30/2023 3:24 PM    [facility]

## 2023-07-31 NOTE — Addendum Note (Signed)
 Addended by: CASIMIR ALDONA BRAVO on: 07/31/2023 04:54 PM   Modules accepted: Orders

## 2023-08-03 ENCOUNTER — Encounter: Payer: Self-pay | Admitting: Orthopaedic Surgery

## 2023-08-14 ENCOUNTER — Ambulatory Visit (HOSPITAL_COMMUNITY)
Admission: RE | Admit: 2023-08-14 | Discharge: 2023-08-14 | Discharge status: Home / Self Care | Source: Ambulatory Visit | Attending: Thoracic Surgery (Cardiothoracic Vascular Surgery) | Admitting: Thoracic Surgery (Cardiothoracic Vascular Surgery)

## 2023-08-14 DIAGNOSIS — I700 Atherosclerosis of aorta: Secondary | ICD-10-CM | POA: Insufficient documentation

## 2023-08-14 DIAGNOSIS — I2510 Atherosclerotic heart disease of native coronary artery without angina pectoris: Secondary | ICD-10-CM | POA: Insufficient documentation

## 2023-08-14 DIAGNOSIS — I7121 Aneurysm of the ascending aorta, without rupture: Secondary | ICD-10-CM | POA: Diagnosis not present

## 2023-08-14 LAB — POCT I-STAT CREATININE: Creatinine, Ser: 1.2 mg/dL (ref 0.61–1.24)

## 2023-08-14 MED ORDER — IOHEXOL 350 MG/ML SOLN
75.0000 mL | Freq: Once | INTRAVENOUS | Status: AC | PRN
  Administered 2023-08-14: 75 mL via INTRAVENOUS

## 2023-08-20 ENCOUNTER — Ambulatory Visit

## 2023-08-20 VITALS — BP 145/83 | HR 65 | Resp 18 | Ht >= 80 in | Wt 227.0 lb

## 2023-08-20 DIAGNOSIS — I7121 Aneurysm of the ascending aorta, without rupture: Secondary | ICD-10-CM | POA: Diagnosis not present

## 2023-08-20 NOTE — Patient Instructions (Signed)

## 2023-08-20 NOTE — Progress Notes (Signed)
 [address] [place] [address]             [phone]            KADAN MILLSTEIN [id number] 1950/12/09   History of Present Illness: MR. [name] is a 73 year old man with medical history of type 2 DM, CAD, peripheral vascular disease, CHF, paroxymal atrial fibrillation, aortic stenosis, NSTEMI, sleep apnea, hypothyroidism, hyperlipidemia, atrial flutter and ascending thoracic aortic aneurysm.  He presents to the clinic today for continued surveillance of ascending thoracic aortic aneurysm. In 2023 he had an echocardiogram which showed a 4.3 cm aortic root.  He had a CT angiogram in 2024 which showed a 5 cm aneurysm of the distal aorta and proximal arch.  CTA of chest in 07/2023 measured aneurysm at 4.7 cm.    He has been doing well overall.  In June 2025 he was seen at the ER for atrial flutter with rapid ventricular response and had direct current cardioversion.  He has an ablation schedule with cardiology on 10/11/2023.  He has control of his blood pressure with medication.  He checks his blood pressure at home and readings range from 120s-140s/60s-80s.  He denies chest pain, tightness, palpitations and shortness of breath.     Current Outpatient Medications on File Prior to Visit  Medication Sig Dispense Refill   apixaban  (ELIQUIS ) 5 MG TABS tablet Take 1 tablet (5 mg total) by mouth 2 (two) times daily. 180 tablet 1   atorvastatin  (LIPITOR) 40 MG tablet Take 1 tablet (40 mg total) by mouth at bedtime. 30 tablet 2   Calcium  Citrate-Vitamin D  (CALCIUM  CITRATE CHEWY BITE PO) Take 500 mg by mouth 3 (three) times daily.     clopidogrel  (PLAVIX ) 75 MG tablet Take 1 tablet (75 mg total) by mouth daily with breakfast. 90 tablet 1   famotidine  (PEPCID ) 40 MG tablet Take 40 mg by mouth at bedtime as needed for heartburn.     ferrous sulfate  325 (65 FE) MG tablet Take 325 mg by mouth See admin instructions. Take one tablet by mouth on Monday, Wednesday and Fridays      gabapentin  (NEURONTIN ) 100 MG capsule TAKE 1 CAPSULE BY MOUTH 4 TIMES A DAY WHEN NECESSARY FOR NEUROPATHY PAIN 360 capsule 2   ipratropium (ATROVENT) 0.03 % nasal spray Place 2 sprays into both nostrils 2 (two) times daily.     levothyroxine  (SYNTHROID ) 125 MCG tablet Take 1 tablet (125 mcg total) by mouth daily at 6 (six) AM. 90 tablet 3   losartan  (COZAAR ) 25 MG tablet Take 1 tablet (25 mg total) by mouth daily. 90 tablet 3   metFORMIN  (GLUCOPHAGE -XR) 500 MG 24 hr tablet Take 1,000 mg by mouth 2 (two) times daily.     Multiple Vitamin (MULTIVITAMIN WITH MINERALS) TABS tablet Take 1 tablet by mouth in the morning and at bedtime. Gummies (Patient taking differently: Take 2 tablets by mouth in the morning and at bedtime. Gummies)     Multiple Vitamins-Minerals (PRESERVISION AREDS 2) CAPS Take 1 capsule by mouth daily.     nitroGLYCERIN  (NITROSTAT ) 0.4 MG SL tablet Place 1 tablet (0.4 mg total) under the tongue every 5 (five) minutes x 3 doses as needed for chest pain. 25 tablet 3   Omega 3 1000 MG CAPS Take 1,000 mg by mouth daily.     Valerian Root 450 MG CAPS Take 450 mg by mouth at bedtime.  vitamin B-12 (CYANOCOBALAMIN ) 1000 MCG tablet Take 1,000 mcg by mouth 3 (three) times a week. Mon, wed, Friday     No current facility-administered medications on file prior to visit.     ROS: Review of Systems  Respiratory: Negative.  Negative for cough and shortness of breath.   Cardiovascular: Negative.  Negative for chest pain, palpitations and leg swelling.  Neurological:  Negative for dizziness.     BP (!) 145/83 (BP Location: Right Arm)   Pulse 65   Resp 18   Ht 6' 8 (2.032 m)   Wt 227 lb (103 kg)   SpO2 99%   BMI 24.94 kg/m   Physical Exam Constitutional:      Appearance: Normal appearance.  HENT:     Head: Normocephalic and atraumatic.  Cardiovascular:     Rate and Rhythm: Normal rate and regular rhythm.     Heart sounds: Normal heart sounds, S1 normal and S2 normal.   Pulmonary:     Effort: Pulmonary effort is normal.     Breath sounds: Normal breath sounds.  Skin:    General: Skin is warm and dry.  Neurological:     General: No focal deficit present.     Mental Status: He is alert and oriented to person, place, and time.        Imaging: CLINICAL DATA:  Aortic aneurysm follow-up.   EXAM: CT ANGIOGRAPHY CHEST WITH CONTRAST   TECHNIQUE: Multidetector CT imaging of the chest was performed using the standard protocol during bolus administration of intravenous contrast. Multiplanar CT image reconstructions and MIPs were obtained to evaluate the vascular anatomy.   RADIATION DOSE REDUCTION: This exam was performed according to the departmental dose-optimization program which includes automated exposure control, adjustment of the mA and/or kV according to patient size and/or use of iterative reconstruction technique.   CONTRAST:  75mL OMNIPAQUE  IOHEXOL  350 MG/ML SOLN   COMPARISON:  Chest CT dated 02/06/2023.   FINDINGS: Cardiovascular: There is no cardiomegaly or pericardial effusion. Advanced 3 vessel coronary vascular calcification. Ascending aorta measures up to 4.7 cm in maximal diameter. No aortic dissection. Mild atherosclerotic calcification. The origins of the great vessels of the aortic arch are patent. The central pulmonary arteries are unremarkable.   Mediastinum/Nodes: No hilar or mediastinal adenopathy. Small hiatal hernia. The esophagus is grossly unremarkable. No mediastinal fluid collection.   Lungs/Pleura: No focal consolidation, pleural effusion, or pneumothorax. There is a 4 mm right middle lobe nodule similar to prior CT. The central airways are patent.   Upper Abdomen: No acute abnormality.   Musculoskeletal: Degenerative changes of the spine. No acute osseous pathology.   Review of the MIP images confirms the above findings.   IMPRESSION: 1. No acute intrathoracic pathology. 2. Stable ascending aortic  aneurysm measuring up to 4.7 cm in maximal diameter. Ascending thoracic aortic aneurysm. Recommend semi-annual imaging followup by CTA or MRA and referral to cardiothoracic surgery if not already obtained. This recommendation follows 2010 ACCF/AHA/AATS/ACR/ASA/SCA/SCAI/SIR/STS/SVM Guidelines for the Diagnosis and Management of Patients With Thoracic Aortic Disease. Circulation. 2010; 121: Z733-z630. Aortic aneurysm NOS (ICD10-I71.9) 3. Advanced coronary vascular calcification. 4.  Aortic Atherosclerosis (ICD10-I70.0).     Electronically Signed   By: Vanetta Chou M.D.   On: 08/15/2023 16:37     A/P: Aneurysm of ascending aorta without rupture (HCC) -4.7 cm ascending thoracic aortic aneurysm on CTA of chest on 07/2023. We discussed the natural history and and risk factors for growth of ascending aortic aneurysms. Discussed recommendations to minimize  the risk of further expansion or dissection including careful blood pressure control, avoidance of contact sports and heavy lifting, attention to lipid management.  We covered the importance of staying never user of tobacco.  The patient does not yet meet surgical criteria of >5.5cm. The patient is aware of signs and symptoms of aortic dissection and when to present to the emergency department   -Follow up in 6 months with CTA of chest  Risk Modification:  Statin:  atorvastatin  40 mg  Smoking cessation instruction/counseling given:  never smoker  Patient was counseled on importance of Blood Pressure Control  They are instructed to contact their Primary Care Physician if they start to have blood pressure readings over 130s/90s. Do not ever stop blood pressure medications on your own, unless instructed by healthcare professional.  Please avoid use of Fluoroquinolones as this can potentially increase your risk of Aortic Rupture and/or Dissection  Patient educated on signs and symptoms of Aortic Dissection, handout also provided in  AVS  Manuelita CHRISTELLA Rough, PA-C 08/20/23

## 2023-08-21 ENCOUNTER — Ambulatory Visit: Admitting: Thoracic Surgery (Cardiothoracic Vascular Surgery)

## 2023-08-24 ENCOUNTER — Encounter: Payer: Self-pay | Admitting: Orthopedic Surgery

## 2023-08-24 ENCOUNTER — Other Ambulatory Visit: Payer: Self-pay

## 2023-08-24 ENCOUNTER — Ambulatory Visit: Admitting: Orthopedic Surgery

## 2023-08-24 DIAGNOSIS — M65911 Unspecified synovitis and tenosynovitis, right shoulder: Secondary | ICD-10-CM | POA: Diagnosis not present

## 2023-08-24 DIAGNOSIS — M25511 Pain in right shoulder: Secondary | ICD-10-CM | POA: Diagnosis not present

## 2023-08-24 DIAGNOSIS — G8929 Other chronic pain: Secondary | ICD-10-CM

## 2023-08-24 MED ORDER — LIDOCAINE HCL 1 % IJ SOLN
5.0000 mL | INTRAMUSCULAR | Status: AC | PRN
  Administered 2023-08-24: 5 mL

## 2023-08-24 MED ORDER — BUPIVACAINE HCL 0.5 % IJ SOLN
9.0000 mL | INTRAMUSCULAR | Status: AC | PRN
Start: 2023-08-24 — End: 2023-08-24
  Administered 2023-08-24: 9 mL via INTRA_ARTICULAR

## 2023-08-24 MED ORDER — TRIAMCINOLONE ACETONIDE 40 MG/ML IJ SUSP
40.0000 mg | INTRAMUSCULAR | Status: AC | PRN
  Administered 2023-08-24: 40 mg via INTRA_ARTICULAR

## 2023-08-24 NOTE — Progress Notes (Signed)
 Office Visit Note   Patient: [name]           Date of Birth: 1950/04/26           MRN: [id number] Visit Date: 08/24/2023 Requested by: Dwight Trula SQUIBB, MD [address] [place],  [place] [address] PCP: Dwight Trula SQUIBB, MD  Subjective: Chief Complaint  Patient presents with   Right Shoulder - Pain    HPI: [name] is a 73 y.o. male who presents to the office reporting right shoulder pain.  Patient had right shoulder AC joint injection 07/19/2023 without any relief.  He does report some radiating shoulder pain to the biceps.  Has a history of prior arthroscopy and rotator cuff repair.  Symptoms ongoing for 3 months.  Hurts in the sleep on the right-hand side.  Hard for him to reach at times.  Patient states his shoulder ached all day yesterday.  Denies much in the way of neck symptoms.  Did have a subacromial injection in late spring which gave him a little relief.  He is on both Eliquis  and Plavix .  He has an ablation scheduled for September 18 and cannot really do any type of interventional procedure prior to that.  Also states that he cannot come off his blood thinners..                ROS: All systems reviewed are negative as they relate to the chief complaint within the history of present illness.  Patient denies fevers or chills.  Assessment & Plan: Visit Diagnoses:  1. Chronic right shoulder pain     Plan: Impression is right shoulder pain unclear etiology but he does have a little bit of mechanical symptoms consistent with possible intra-articular pathology.  He has had a subacromial injection with minimal results and AC joint injection with no relief.  Does have some AC joint arthritis on radiographs but that is likely not symptomatic.  I think he could have some glenohumeral joint pathology.  Based on his timetable of not being able to have any intervention before September and 3 months afterwards it makes sense to do an ultrasound-guided glenohumeral joint  injection today.  Nonarthrogram MRI of the right shoulder also indicated to evaluate rotator cuff and biceps.  Follow-up after that study.  Follow-Up Instructions: No follow-ups on file.   Orders:  Orders Placed This Encounter  Procedures   US  Guided Needle Placement - No Linked Charges   No orders of the defined types were placed in this encounter.     Procedures: Large Joint Inj: R glenohumeral on 08/24/2023 4:27 PM Indications: diagnostic evaluation and pain Details: 22 G 3.5 in needle, ultrasound-guided posterior approach  Arthrogram: No  Medications: 9 mL bupivacaine  0.5 %; 5 mL lidocaine  1 %; 40 mg triamcinolone  acetonide 40 MG/ML Outcome: tolerated well, no immediate complications Procedure, treatment alternatives, risks and benefits explained, specific risks discussed. Consent was given by the patient. Immediately prior to procedure a time out was called to verify the correct patient, procedure, equipment, support staff and site/side marked as required. Patient was prepped and draped in the usual sterile fashion.       Clinical Data: No additional findings.  Objective: Vital Signs: There were no vitals taken for this visit.  Physical Exam:  Constitutional: Patient appears well-developed HEENT:  Head: Normocephalic Eyes:EOM are normal Neck: Normal range of motion Cardiovascular: Normal rate Pulmonary/chest: Effort normal Neurologic: Patient is alert Skin: Skin is warm Psychiatric: Patient  has normal mood and affect  Ortho Exam: Ortho exam demonstrates good cervical spine range of motion.  Patient has 5 out of 5 grip EPL FPL interosseous wrist flexion extension bicep tricep and deltoid strength.  A little bit of coarseness and popping with internal/external rotation at 9 degrees of abduction.  More popping than coarseness.  Rotator cuff strength is excellent infraspinatus supraspinatus and subscap muscle testing with equivocal O'Brien's testing on the right negative  on the left.  No discrete AC joint tenderness right versus left.  Subscap strength 5+ out of 5.  Mild bicipital groove tenderness.  Specialty Comments:  No specialty comments available.  Imaging: US  Guided Needle Placement - No Linked Charges Result Date: 08/24/2023 Ultrasound imaging demonstrates needle placement into the glenohumeral joint with injection of fluid into the joint and no complicating features. Right shoulder    PMFS History: Patient Active Problem List   Diagnosis Date Noted   Atrial flutter (HCC) 06/27/2023   Paroxysmal atrial fibrillation with RVR (HCC) 08/23/2022   Ascending aortic aneurysm (HCC) 08/23/2022   Bradycardia 08/22/2022   NSTEMI (non-ST elevated myocardial infarction) (HCC) 08/22/2022   Atrial fibrillation with RVR (HCC) 08/22/2022   Hypotension 01/07/2021   S/P arthroscopy of right shoulder 06/02/2020   Closed fracture of neck of right femur (HCC)    Hip fracture (HCC) 03/27/2020   Nontraumatic complete tear of right rotator cuff 02/26/2020   S/P transmetatarsal amputation of foot, left (HCC) 04/25/2016   Chronic osteomyelitis of toe, left (HCC)    Chronic diastolic CHF (congestive heart failure) (HCC) 09/25/2015   Aortic stenosis 08/04/2014   Diabetic foot infection (HCC) 06/03/2014   Peripheral vascular disease (HCC) 06/03/2014   Coronary artery disease involving native coronary artery of native heart without angina pectoris 07/29/2013   Trifascicular block 07/29/2013   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 remote stenting to ramus, prox LAD x2.   Dental crowns present    Diabetes mellitus    First degree  AV block    GERD (gastroesophageal reflux disease)    Heart attack (HCC) 06/2004   Heart murmur    aortic   Hx MRSA infection 02/2006   Hyperlipidemia    Hypertension    hx. of - has not been on med. since losing wt. after gastric bypass   Hypothyroidism    Morbid obesity (HCC)    Mucoid cyst of joint 09/2011   left thumb   Sleep apnea    sleep study 03/29/2011; no CPAP use, lost >130lbs   Trifascicular block     Family History  Problem Relation Age of Onset   Heart disease Mother     Past Surgical History:  Procedure Laterality Date   AMPUTATION Left 04/19/2016   Procedure: Left Foot 4th Toe Amputation vs. Transmetatarsal;  Surgeon: Jerona Harden GAILS, MD;  Location: [facility] OR;  Service: Orthopedics;  Laterality: Left;   BIOPSY  04/07/2019   Procedure: BIOPSY;  Surgeon: Elicia Claw, MD;  Location: [facility];  Service: Gastroenterology;;   CATARACT EXTRACTION  02/2010; 03/2010   COLONOSCOPY WITH PROPOFOL  N/A 09/14/2014   Procedure: COLONOSCOPY  WITH PROPOFOL ;  Surgeon: Gladis MARLA Louder, MD;  Location: [facility];  Service: Endoscopy;  Laterality: N/A;   COLONOSCOPY WITH PROPOFOL  N/A 04/07/2019   Procedure: COLONOSCOPY WITH PROPOFOL ;  Surgeon: Elicia Claw, MD;  Location: [facility];  Service: Gastroenterology;  Laterality: N/A;   CORONARY ANGIOPLASTY WITH STENT PLACEMENT  07/12/2004; 08/03/2004   total of 3 stents   CORONARY STENT INTERVENTION N/A 08/23/2022   Procedure: CORONARY STENT INTERVENTION;  Surgeon: Swaziland, Peter M, MD;  Location: [facility] INVASIVE CV LAB;  Service: Cardiovascular;  Laterality: N/A;   I & D EXTREMITY Right 06/04/2014   Procedure: IRRIGATION AND DEBRIDEMENT EXTREMITY;  Surgeon: Lonni CINDERELLA Poli, MD;  Location: [facility] OR;  Service: Orthopedics;  Laterality: Right;   LEFT HEART CATH AND CORONARY ANGIOGRAPHY N/A 08/23/2022   Procedure: LEFT HEART CATH AND CORONARY ANGIOGRAPHY;  Surgeon: Swaziland, Peter M, MD;  Location: [facility] INVASIVE CV LAB;  Service: Cardiovascular;   Laterality: N/A;   MASS EXCISION  10/04/2011   Procedure: EXCISION MASS;  Surgeon: Arley JONELLE Curia, MD;  Location: [facility];  Service: Orthopedics;  Laterality: Left;  excision cyst debridment ip joint of left thumb   PROSTATECTOMY     right below knee amputation  2018   ROUX-EN-Y GASTRIC BYPASS  10/10/2010   laparoscopic   SHOULDER ARTHROSCOPY WITH ROTATOR CUFF REPAIR AND SUBACROMIAL DECOMPRESSION Right 03/23/2020   Procedure: RIGHT SHOULDER ARTHROSCOPY WITH DEBRIDEMENT AND  ROTATOR CUFF REPAIR;  Surgeon: Poli Lonni CINDERELLA, MD;  Location: [facility] OR;  Service: Orthopedics;  Laterality: Right;   TOE AMPUTATION  02/23/2006   left foot second ray amputation   TOTAL HIP ARTHROPLASTY Right 03/29/2020   Procedure: TOTAL HIP ARTHROPLASTY POSTERIOR APPROACH;  Surgeon: Jerri Kay HERO, MD;  Location: [facility] OR;  Service: Orthopedics;  Laterality: Right;   TRANSMETATARSAL AMPUTATION Left    TRIGGER FINGER RELEASE Right 09/16/2019   Procedure: RELEASE TRIGGER FINGER/A-1 PULLEY;  Surgeon: Curia Arley, MD;  Location: [facility];  Service: Orthopedics;  Laterality: Right;  IV REGIONAL FOREARM BLOCK   Social History   Occupational History   Occupation: IT trainer taxes  Tobacco Use   Smoking status: Never   Smokeless tobacco: Never  Substance and Sexual Activity   Alcohol use: No   Drug use: No   Sexual activity: Yes

## 2023-08-27 NOTE — Progress Notes (Signed)
MRI order entered.

## 2023-08-27 NOTE — Addendum Note (Signed)
 Addended by: TRINDA DEANE HERO on: 08/27/2023 10:13 AM   Modules accepted: Orders

## 2023-09-01 ENCOUNTER — Ambulatory Visit
Admission: RE | Admit: 2023-09-01 | Discharge: 2023-09-01 | Disposition: A | Discharge status: Home / Self Care | Source: Ambulatory Visit | Attending: Orthopedic Surgery | Admitting: Orthopedic Surgery

## 2023-09-01 DIAGNOSIS — G8929 Other chronic pain: Secondary | ICD-10-CM

## 2023-09-10 DIAGNOSIS — R630 Anorexia: Secondary | ICD-10-CM | POA: Diagnosis not present

## 2023-09-10 DIAGNOSIS — I10 Essential (primary) hypertension: Secondary | ICD-10-CM | POA: Diagnosis not present

## 2023-09-10 DIAGNOSIS — I480 Paroxysmal atrial fibrillation: Secondary | ICD-10-CM | POA: Diagnosis not present

## 2023-09-10 DIAGNOSIS — E039 Hypothyroidism, unspecified: Secondary | ICD-10-CM | POA: Diagnosis not present

## 2023-09-10 DIAGNOSIS — E1149 Type 2 diabetes mellitus with other diabetic neurological complication: Secondary | ICD-10-CM | POA: Diagnosis not present

## 2023-09-10 DIAGNOSIS — D6869 Other thrombophilia: Secondary | ICD-10-CM | POA: Diagnosis not present

## 2023-09-12 ENCOUNTER — Ambulatory Visit: Admitting: Orthopaedic Surgery

## 2023-09-17 ENCOUNTER — Encounter: Payer: Self-pay | Admitting: Cardiovascular Disease

## 2023-09-18 ENCOUNTER — Other Ambulatory Visit: Payer: Self-pay | Admitting: Cardiology

## 2023-09-18 ENCOUNTER — Other Ambulatory Visit: Payer: Self-pay | Admitting: *Deleted

## 2023-09-18 DIAGNOSIS — I480 Paroxysmal atrial fibrillation: Secondary | ICD-10-CM

## 2023-09-18 MED ORDER — CLOPIDOGREL BISULFATE 75 MG PO TABS: 75.0000 mg | ORAL_TABLET | Freq: Every day | ORAL | 2 refills | Status: AC

## 2023-09-18 MED ORDER — APIXABAN 5 MG PO TABS: 5.0000 mg | ORAL_TABLET | Freq: Two times a day (BID) | ORAL | 1 refills | Status: AC

## 2023-09-18 NOTE — Telephone Encounter (Signed)
 Prescription refill request for Eliquis  received. Indication:  PAF Last office visit: 07/30/23  A Mealor MD Scr: 1.20 on 08/14/23  Epic Age: 73 Weight: 99.3kg  Based on above findings Eliquis  5mg  twice daily is the appropriate dose.  Refill approved.

## 2023-09-20 ENCOUNTER — Ambulatory Visit (HOSPITAL_COMMUNITY)

## 2023-09-20 DIAGNOSIS — R002 Palpitations: Secondary | ICD-10-CM | POA: Diagnosis not present

## 2023-09-20 DIAGNOSIS — I453 Trifascicular block: Secondary | ICD-10-CM | POA: Diagnosis not present

## 2023-09-20 DIAGNOSIS — I480 Paroxysmal atrial fibrillation: Secondary | ICD-10-CM | POA: Diagnosis not present

## 2023-09-20 DIAGNOSIS — Z01812 Encounter for preprocedural laboratory examination: Secondary | ICD-10-CM | POA: Diagnosis not present

## 2023-09-21 ENCOUNTER — Ambulatory Visit (INDEPENDENT_AMBULATORY_CARE_PROVIDER_SITE_OTHER): Admitting: Orthopedic Surgery

## 2023-09-21 DIAGNOSIS — M25511 Pain in right shoulder: Secondary | ICD-10-CM

## 2023-09-21 DIAGNOSIS — G8929 Other chronic pain: Secondary | ICD-10-CM | POA: Diagnosis not present

## 2023-09-21 LAB — BASIC METABOLIC PANEL WITH GFR
BUN/Creatinine Ratio: 13 (ref 10–24)
BUN: 12 mg/dL (ref 8–27)
CO2: 19 mmol/L — ABNORMAL LOW (ref 20–29)
Calcium: 9.3 mg/dL (ref 8.6–10.2)
Chloride: 104 mmol/L (ref 96–106)
Creatinine, Ser: 0.94 mg/dL (ref 0.76–1.27)
Glucose: 122 mg/dL — ABNORMAL HIGH (ref 70–99)
Potassium: 4.1 mmol/L (ref 3.5–5.2)
Sodium: 142 mmol/L (ref 134–144)
eGFR: 85 mL/min/1.73 (ref >59)

## 2023-09-21 LAB — CBC
Hematocrit: 41.6 % (ref 37.5–51.0)
Hemoglobin: 13.7 g/dL (ref 13.0–17.7)
MCH: 31.6 pg (ref 26.6–33.0)
MCHC: 32.9 g/dL (ref 31.5–35.7)
MCV: 96 fL (ref 79–97)
Platelets: 201 x10E3/uL (ref 150–450)
RBC: 4.33 x10E6/uL (ref 4.14–5.80)
RDW: 12.5 % (ref 11.6–15.4)
WBC: 5.4 x10E3/uL (ref 3.4–10.8)

## 2023-09-22 ENCOUNTER — Encounter: Payer: Self-pay | Admitting: Orthopedic Surgery

## 2023-09-22 NOTE — Progress Notes (Signed)
 Office Visit Note   Patient: [name]           Date of Birth: [birth year]           MRN: [id number] Visit Date: 09/21/2023 Requested by: Dwight Trula SQUIBB, MD [address] [place],  [place] [address] PCP: Dwight Trula SQUIBB, MD  Subjective: Chief Complaint  Patient presents with   Right Shoulder - Follow-up    MRI REVIEW    HPI: [name] is a 73 y.o. male who presents to the office reporting right shoulder pain.  He did have a glenohumeral joint injection which has helped him.  Since he was last seen has had an MRI scan which is reviewed.  That does show some progression of full-thickness tear of the supraspinatus with retraction to the midpoint of the humeral head with moderate muscle belly fatty atrophy.             ROS: All systems reviewed are negative as they relate to the chief complaint within the history of present illness.  Patient denies fevers or chills.  Assessment & Plan: Visit Diagnoses:  1. Chronic right shoulder pain     Plan: Impression is functional shoulder on the right-hand side with rotator cuff tear which appears chronic.  Does not look like he is having much in terms of biceps pain generation.  Plan at this time is to start physical therapy for right shoulder below shoulder strengthening exercises 2 times a week for 4 weeks.  We will start that at the end of September 2 weeks after his ablation for atrial fibrillation.  Come back at the end of October for clinical recheck.  Decision for against any type of surgical intervention at that time.  Follow-Up Instructions: No follow-ups on file.   Orders:  Orders Placed This Encounter  Procedures   Ambulatory referral to Physical Therapy   No orders of the defined types were placed in this encounter.     Procedures: No procedures performed   Clinical Data: No additional findings.  Objective: Vital Signs: There were no vitals taken for this visit.  Physical Exam:  Constitutional: Patient  appears well-developed HEENT:  Head: Normocephalic Eyes:EOM are normal Neck: Normal range of motion Cardiovascular: Normal rate Pulmonary/chest: Effort normal Neurologic: Patient is alert Skin: Skin is warm Psychiatric: Patient has normal mood and affect  Ortho Exam: Ortho exam demonstrates good subscap strength and external rotation strength at 15 degrees of abduction.  Does have a little bit of crepitus with passive range of motion of the right shoulder.  Negative O'Brien's testing.  No discrete AC joint tenderness is present.  No masses lymphadenopathy or skin changes noted in that shoulder girdle region.  Specialty Comments:  No specialty comments available.  Imaging: No results found.   PMFS History: Patient Active Problem List   Diagnosis Date Noted   Atrial flutter (HCC) 06/27/2023   Paroxysmal atrial fibrillation with RVR (HCC) 08/23/2022   Ascending aortic aneurysm (HCC) 08/23/2022   Bradycardia 08/22/2022   NSTEMI (non-ST elevated myocardial infarction) (HCC) 08/22/2022   Atrial fibrillation with RVR (HCC) 08/22/2022   Hypotension 01/07/2021   S/P arthroscopy of right shoulder 06/02/2020   Closed fracture of neck of right femur (HCC)    Hip fracture (HCC) 03/27/2020   Nontraumatic complete tear of right rotator cuff 02/26/2020   S/P transmetatarsal amputation of foot, left (HCC) 04/25/2016   Chronic osteomyelitis of toe, left (HCC)    Chronic diastolic CHF (  congestive heart failure) (HCC) 09/25/2015   Aortic stenosis 08/04/2014   Diabetic foot infection (HCC) 06/03/2014   Peripheral vascular disease (HCC) 06/03/2014   Coronary artery disease involving native coronary artery of native heart without angina pectoris 07/29/2013   Trifascicular block 07/29/2013   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 07/26/2011   Hypothyroidism 01/22/2007   Type 2 diabetes mellitus with vascular disease (HCC) 01/22/2007   HYPERLIPIDEMIA, MIXED 01/22/2007   MYOCARDIAL INFARCTION, HX  OF 01/22/2007   ANEMIA, IRON DEFICIENCY, HX OF 01/22/2007   Past Medical History:  Diagnosis Date   Anemia    low iron   Arthritis    Coronary artery disease 2006   a. remote stenting to ramus, prox LAD x2.   Dental crowns present    Diabetes mellitus    First degree AV block    GERD (gastroesophageal reflux disease)    Heart attack (HCC) 06/2004   Heart murmur    aortic   Hx MRSA infection 02/2006   Hyperlipidemia    Hypertension    hx. of - has not been on med. since losing wt. after gastric bypass   Hypothyroidism    Morbid obesity (HCC)    Mucoid cyst of joint 09/2011   left thumb   Sleep apnea    sleep study 03/29/2011; no CPAP use, lost >130lbs   Trifascicular block     Family History  Problem Relation Age of Onset   Heart disease Mother     Past Surgical History:  Procedure Laterality Date   AMPUTATION Left 04/19/2016   Procedure: Left Foot 4th Toe Amputation vs. Transmetatarsal;  Surgeon: Jerona Harden GAILS, MD;  Location: [facility] OR;  Service: Orthopedics;  Laterality: Left;   BIOPSY  04/07/2019   Procedure: BIOPSY;  Surgeon: Elicia Claw, MD;  Location: [facility];  Service: Gastroenterology;;   CATARACT EXTRACTION  02/2010; 03/2010   COLONOSCOPY WITH PROPOFOL  N/A 09/14/2014   Procedure: COLONOSCOPY WITH PROPOFOL ;  Surgeon: Gladis MARLA Louder, MD;  Location: [facility];  Service: Endoscopy;  Laterality: N/A;   COLONOSCOPY WITH PROPOFOL  N/A 04/07/2019   Procedure: COLONOSCOPY WITH PROPOFOL ;  Surgeon: Elicia Claw, MD;  Location: [facility];  Service: Gastroenterology;  Laterality: N/A;   CORONARY ANGIOPLASTY WITH STENT PLACEMENT  07/12/2004; 08/03/2004   total of 3 stents   CORONARY STENT INTERVENTION N/A 08/23/2022   Procedure: CORONARY STENT INTERVENTION;  Surgeon: Swaziland, Peter M, MD;  Location: [facility] INVASIVE CV LAB;  Service: Cardiovascular;  Laterality: N/A;   I & D EXTREMITY Right 06/04/2014   Procedure: IRRIGATION AND DEBRIDEMENT EXTREMITY;  Surgeon: Lonni CINDERELLA Poli, MD;  Location: [facility] OR;  Service: Orthopedics;  Laterality: Right;   LEFT HEART CATH AND CORONARY ANGIOGRAPHY N/A 08/23/2022   Procedure: LEFT HEART CATH AND CORONARY ANGIOGRAPHY;  Surgeon: Swaziland, Peter M, MD;  Location[facility] INVASIVE CV LAB;  Service: Cardiovascular;  Laterality: N/A;   MASS EXCISION  10/04/2011   Procedure: EXCISION MASS;  Surgeon: Arley JONELLE Curia, MD;  Location: [facility];  Service: Orthopedics;  Laterality: Left;  excision cyst debridment ip joint of left thumb   PROSTATECTOMY     right below knee amputation  2018   ROUX-EN-Y GASTRIC BYPASS  10/10/2010   laparoscopic   SHOULDER ARTHROSCOPY WITH ROTATOR CUFF REPAIR AND SUBACROMIAL DECOMPRESSION Right 03/23/2020   Procedure: RIGHT SHOULDER ARTHROSCOPY WITH DEBRIDEMENT AND  ROTATOR CUFF REPAIR;  Surgeon: Poli Lonni CINDERELLA, MD;  Location: [facility] OR;  Service: Orthopedics;  Laterality: Right;  TOE AMPUTATION  02/23/2006   left foot second ray amputation   TOTAL HIP ARTHROPLASTY Right 03/29/2020   Procedure: TOTAL HIP ARTHROPLASTY POSTERIOR APPROACH;  Surgeon: Jerri Kay HERO, MD;  Location: [facility] OR;  Service: Orthopedics;  Laterality: Right;   TRANSMETATARSAL AMPUTATION Left    TRIGGER FINGER RELEASE Right 09/16/2019   Procedure: RELEASE TRIGGER FINGER/A-1 PULLEY;  Surgeon: Murrell Kuba, MD;  Location: [facility];  Service: Orthopedics;  Laterality: Right;  IV REGIONAL FOREARM BLOCK   Social History   Occupational History   Occupation: IT trainer taxes  Tobacco Use   Smoking status: Never   Smokeless tobacco: Never  Substance and Sexual Activity   Alcohol use: No   Drug use: No   Sexual activity: Yes

## 2023-09-25 ENCOUNTER — Telehealth: Payer: Self-pay

## 2023-09-25 ENCOUNTER — Ambulatory Visit (HOSPITAL_COMMUNITY)
Admission: RE | Admit: 2023-09-25 | Discharge: 2023-09-25 | Disposition: A | Discharge status: Home / Self Care | Source: Ambulatory Visit | Attending: Cardiovascular Disease

## 2023-09-25 DIAGNOSIS — Z955 Presence of coronary angioplasty implant and graft: Secondary | ICD-10-CM | POA: Diagnosis not present

## 2023-09-25 DIAGNOSIS — I7121 Aneurysm of the ascending aorta, without rupture: Secondary | ICD-10-CM | POA: Insufficient documentation

## 2023-09-25 DIAGNOSIS — I480 Paroxysmal atrial fibrillation: Secondary | ICD-10-CM

## 2023-09-25 MED ORDER — IOHEXOL 350 MG/ML SOLN
80.0000 mL | Freq: Once | INTRAVENOUS | Status: AC | PRN
  Administered 2023-09-25: 80 mL via INTRAVENOUS

## 2023-09-25 NOTE — Telephone Encounter (Signed)
-----   Message from KANDICE Glendia Hutchinson sent at 09/22/2023 12:13 PM EDT ----- Erskin Maxwell.  Please follow-up Herlene and of October.  Thanks

## 2023-09-25 NOTE — Telephone Encounter (Signed)
 Can you please make sure patient has a follow up appt.

## 2023-09-26 ENCOUNTER — Ambulatory Visit: Payer: Self-pay

## 2023-09-27 ENCOUNTER — Other Ambulatory Visit (INDEPENDENT_AMBULATORY_CARE_PROVIDER_SITE_OTHER): Payer: Self-pay

## 2023-09-27 ENCOUNTER — Ambulatory Visit (INDEPENDENT_AMBULATORY_CARE_PROVIDER_SITE_OTHER): Admitting: Orthopaedic Surgery

## 2023-09-27 DIAGNOSIS — M25552 Pain in left hip: Secondary | ICD-10-CM

## 2023-09-27 DIAGNOSIS — M7062 Trochanteric bursitis, left hip: Secondary | ICD-10-CM

## 2023-09-27 MED ORDER — TRAMADOL HCL 50 MG PO TABS: 50.0000 mg | ORAL_TABLET | Freq: Two times a day (BID) | ORAL | 0 refills | Status: DC | PRN

## 2023-09-27 MED ORDER — LIDOCAINE HCL 1 % IJ SOLN
3.0000 mL | INTRAMUSCULAR | Status: AC | PRN
  Administered 2023-09-27: 3 mL

## 2023-09-27 MED ORDER — METHYLPREDNISOLONE ACETATE 40 MG/ML IJ SUSP
40.0000 mg | INTRAMUSCULAR | Status: AC | PRN
  Administered 2023-09-27: 40 mg via INTRA_ARTICULAR

## 2023-09-27 NOTE — Progress Notes (Signed)
 The patient is a 73 year old gentleman who comes in reporting worsening left hip pain over the last aspect of his hip recently but has been getting significantly worse.  That he has no injury to this hip.  It hurts when he lays on that side as well.  He denies any numbness and tingling or groin pain.  He does have a history of a right posterior hip replacement and he does have a right below knee prosthesis secondary to amputation.  He is scheduled for a cardiac ablation secondary to A-fib later this month.  On exam his right hip moves smoothly and fluidly.  His left painful hip moves smoothly and fluidly and there is pain to palpation along the tip of the greater trochanter and the proximal IT band.  An AP pelvis and lateral of the left hip shows no cortical irregularities around the trochanteric area and the hip itself is well located with no significant arthritic changes.  There is a well-seated right total hip arthroplasty.  His signs and symptoms are consistent with trochanteric bursitis and IT band syndrome.  I did recommend a steroid injection over this area.  He is a diabetic but has had steroid injections before without detrimentally affecting his blood glucose.  He will still watch this closely.  I did place a steroid injection without difficulty and we will send in some tramadol  for pain.  Follow-up can be as needed.  If things worsen he knows to let us  know.    Procedure Note  Patient: [name]             Date of Birth: 02-Feb-1950           MRN: [id number]             Visit Date: 09/27/2023  Procedures: Visit Diagnoses:  1. Pain in left hip   2. Trochanteric bursitis, left hip     Large Joint Inj: L greater trochanter on 09/27/2023 4:49 PM Indications: pain and diagnostic evaluation Details: 22 G 1.5 in needle, lateral approach  Arthrogram: No  Medications: 3 mL lidocaine  1 %; 40 mg methylPREDNISolone  acetate 40 MG/ML Outcome: tolerated well, no immediate  complications Procedure, treatment alternatives, risks and benefits explained, specific risks discussed. Consent was given by the patient. Immediately prior to procedure a time out was called to verify the correct patient, procedure, equipment, support staff and site/side marked as required. Patient was prepped and draped in the usual sterile fashion.

## 2023-09-30 ENCOUNTER — Encounter: Payer: Self-pay | Admitting: Orthopaedic Surgery

## 2023-10-01 ENCOUNTER — Other Ambulatory Visit: Payer: Self-pay

## 2023-10-01 ENCOUNTER — Other Ambulatory Visit: Payer: Self-pay | Admitting: Radiology

## 2023-10-01 DIAGNOSIS — M25552 Pain in left hip: Secondary | ICD-10-CM

## 2023-10-01 DIAGNOSIS — M7062 Trochanteric bursitis, left hip: Secondary | ICD-10-CM

## 2023-10-02 ENCOUNTER — Encounter: Payer: Self-pay | Admitting: Cardiovascular Disease

## 2023-10-05 ENCOUNTER — Ambulatory Visit
Admission: RE | Admit: 2023-10-05 | Discharge: 2023-10-05 | Disposition: A | Discharge status: Home / Self Care | Source: Ambulatory Visit | Attending: Orthopaedic Surgery | Admitting: Orthopaedic Surgery

## 2023-10-05 DIAGNOSIS — M25552 Pain in left hip: Secondary | ICD-10-CM

## 2023-10-08 ENCOUNTER — Ambulatory Visit: Admitting: Orthopaedic Surgery

## 2023-10-10 NOTE — Anesthesia Preprocedure Evaluation (Signed)
 Anesthesia Evaluation  Patient identified by MRN, date of birth, ID band Patient awake    Reviewed: Allergy & Precautions, NPO status , Patient's Chart, lab work & pertinent test results  History of Anesthesia Complications Negative for: history of anesthetic complications  Airway Mallampati: I  TM Distance: >3 FB Neck ROM: Limited    Dental  (+) Dental Advisory Given   Pulmonary COPD,  COPD inhaler   breath sounds clear to auscultation       Cardiovascular hypertension, Pt. on medications (-) angina + CAD, + Past MI, + Cardiac Stents and + Peripheral Vascular Disease  + dysrhythmias Atrial Fibrillation  Rhythm:Irregular Rate:Normal  '24 ECHo: EF 60-65%, normal LVF, Grade 1 DD, normal RVF, no significant valvular abnormalities, aortic root 41 mm, asc aorta 44 mm   Neuro/Psych negative neurological ROS     GI/Hepatic Neg liver ROS,GERD  Medicated and Controlled,,H/o gastric bypass   Endo/Other  diabetes (glu 130), Oral Hypoglycemic AgentsHypothyroidism    Renal/GU negative Renal ROS     Musculoskeletal   Abdominal   Peds  Hematology eliquis    Anesthesia Other Findings   Reproductive/Obstetrics                              Anesthesia Physical Anesthesia Plan  ASA: 3  Anesthesia Plan: General   Post-op Pain Management: Tylenol  PO (pre-op)* and Minimal or no pain anticipated   Induction: Intravenous  PONV Risk Score and Plan: 2 and Ondansetron  and Dexamethasone   Airway Management Planned: Oral ETT  Additional Equipment: None  Intra-op Plan:   Post-operative Plan: Extubation in OR  Informed Consent: I have reviewed the patients History and Physical, chart, labs and discussed the procedure including the risks, benefits and alternatives for the proposed anesthesia with the patient or authorized representative who has indicated his/her understanding and acceptance.     Dental  advisory given  Plan Discussed with: CRNA and Surgeon  Anesthesia Plan Comments:          Anesthesia Quick Evaluation

## 2023-10-10 NOTE — Pre-Procedure Instructions (Signed)
 Instructed patient on the following items: Arrival time 0515 Nothing to eat or drink after midnight No meds AM of procedure Responsible person to drive you home and stay with you for 24 hrs  Have you missed any doses of anti-coagulant Eliquis- takes twice a day, hasn't missed any doses in last 4 weeks.  Don't take dose morning of procedure.

## 2023-10-11 ENCOUNTER — Other Ambulatory Visit: Payer: Self-pay

## 2023-10-11 ENCOUNTER — Ambulatory Visit (HOSPITAL_COMMUNITY)
Admission: RE | Disposition: A | Discharge status: Home / Self Care | Payer: Self-pay | Source: Home / Self Care | Attending: Cardiovascular Disease

## 2023-10-11 ENCOUNTER — Ambulatory Visit (HOSPITAL_BASED_OUTPATIENT_CLINIC_OR_DEPARTMENT_OTHER): Payer: Self-pay

## 2023-10-11 ENCOUNTER — Encounter (HOSPITAL_COMMUNITY): Payer: Self-pay

## 2023-10-11 ENCOUNTER — Ambulatory Visit (HOSPITAL_COMMUNITY)
Admission: RE | Admit: 2023-10-11 | Discharge: 2023-10-11 | Disposition: A | Discharge status: Home / Self Care | Attending: Cardiovascular Disease | Admitting: Cardiovascular Disease

## 2023-10-11 DIAGNOSIS — Z7902 Long term (current) use of antithrombotics/antiplatelets: Secondary | ICD-10-CM | POA: Insufficient documentation

## 2023-10-11 DIAGNOSIS — Z955 Presence of coronary angioplasty implant and graft: Secondary | ICD-10-CM | POA: Insufficient documentation

## 2023-10-11 DIAGNOSIS — Z7901 Long term (current) use of anticoagulants: Secondary | ICD-10-CM | POA: Insufficient documentation

## 2023-10-11 DIAGNOSIS — E1151 Type 2 diabetes mellitus with diabetic peripheral angiopathy without gangrene: Secondary | ICD-10-CM | POA: Insufficient documentation

## 2023-10-11 DIAGNOSIS — I2510 Atherosclerotic heart disease of native coronary artery without angina pectoris: Secondary | ICD-10-CM | POA: Diagnosis not present

## 2023-10-11 DIAGNOSIS — I480 Paroxysmal atrial fibrillation: Secondary | ICD-10-CM

## 2023-10-11 DIAGNOSIS — I119 Hypertensive heart disease without heart failure: Secondary | ICD-10-CM | POA: Insufficient documentation

## 2023-10-11 DIAGNOSIS — I495 Sick sinus syndrome: Secondary | ICD-10-CM | POA: Insufficient documentation

## 2023-10-11 DIAGNOSIS — I5032 Chronic diastolic (congestive) heart failure: Secondary | ICD-10-CM

## 2023-10-11 DIAGNOSIS — I4819 Other persistent atrial fibrillation: Secondary | ICD-10-CM | POA: Diagnosis not present

## 2023-10-11 DIAGNOSIS — J449 Chronic obstructive pulmonary disease, unspecified: Secondary | ICD-10-CM | POA: Diagnosis not present

## 2023-10-11 DIAGNOSIS — Z7984 Long term (current) use of oral hypoglycemic drugs: Secondary | ICD-10-CM | POA: Insufficient documentation

## 2023-10-11 DIAGNOSIS — I483 Typical atrial flutter: Secondary | ICD-10-CM | POA: Diagnosis not present

## 2023-10-11 DIAGNOSIS — I110 Hypertensive heart disease with heart failure: Secondary | ICD-10-CM

## 2023-10-11 DIAGNOSIS — E039 Hypothyroidism, unspecified: Secondary | ICD-10-CM | POA: Diagnosis not present

## 2023-10-11 DIAGNOSIS — K219 Gastro-esophageal reflux disease without esophagitis: Secondary | ICD-10-CM | POA: Diagnosis not present

## 2023-10-11 DIAGNOSIS — I452 Bifascicular block: Secondary | ICD-10-CM | POA: Diagnosis not present

## 2023-10-11 DIAGNOSIS — I252 Old myocardial infarction: Secondary | ICD-10-CM | POA: Insufficient documentation

## 2023-10-11 HISTORY — PX: ATRIAL FIBRILLATION ABLATION: EP1191

## 2023-10-11 LAB — GLUCOSE, CAPILLARY
Glucose-Capillary: 130 mg/dL — ABNORMAL HIGH (ref 70–99)
Glucose-Capillary: 148 mg/dL — ABNORMAL HIGH (ref 70–99)

## 2023-10-11 LAB — POCT ACTIVATED CLOTTING TIME: Activated Clotting Time: 314 s

## 2023-10-11 MED ORDER — SODIUM CHLORIDE 0.9% FLUSH: 3.0000 mL | Freq: Two times a day (BID) | INTRAVENOUS | Status: DC

## 2023-10-11 MED ORDER — ATROPINE SULFATE 1 MG/ML IV SOLN: INTRAVENOUS | Status: DC | PRN

## 2023-10-11 MED ORDER — EPHEDRINE SULFATE-NACL 50-0.9 MG/10ML-% IV SOSY: PREFILLED_SYRINGE | INTRAVENOUS | Status: DC | PRN

## 2023-10-11 MED ORDER — ONDANSETRON HCL 4 MG/2ML IJ SOLN
INTRAMUSCULAR | Status: DC | PRN
  Administered 2023-10-11: 4 mg via INTRAVENOUS

## 2023-10-11 MED ORDER — NITROGLYCERIN 1 MG/10 ML FOR IR/CATH LAB
INTRA_ARTERIAL | Status: AC
  Filled 2023-10-11: qty 10

## 2023-10-11 MED ORDER — LIDOCAINE 2% (20 MG/ML) 5 ML SYRINGE
INTRAMUSCULAR | Status: DC | PRN
  Administered 2023-10-11: 20 mg via INTRAVENOUS

## 2023-10-11 MED ORDER — FENTANYL CITRATE (PF) 100 MCG/2ML IJ SOLN
INTRAMUSCULAR | Status: AC
  Filled 2023-10-11: qty 2

## 2023-10-11 MED ORDER — DEXAMETHASONE SODIUM PHOSPHATE 10 MG/ML IJ SOLN
INTRAMUSCULAR | Status: DC | PRN
  Administered 2023-10-11: 5 mg via INTRAVENOUS

## 2023-10-11 MED ORDER — ROCURONIUM BROMIDE 10 MG/ML (PF) SYRINGE
PREFILLED_SYRINGE | INTRAVENOUS | Status: DC | PRN
  Administered 2023-10-11: 60 mg via INTRAVENOUS
  Administered 2023-10-11: 40 mg via INTRAVENOUS

## 2023-10-11 MED ORDER — PHENYLEPHRINE HCL-NACL 20-0.9 MG/250ML-% IV SOLN
INTRAVENOUS | Status: DC | PRN
  Administered 2023-10-11: 40 ug/min via INTRAVENOUS

## 2023-10-11 MED ORDER — ONDANSETRON HCL 4 MG/2ML IJ SOLN: 4.0000 mg | Freq: Four times a day (QID) | INTRAMUSCULAR | Status: DC | PRN

## 2023-10-11 MED ORDER — SUGAMMADEX SODIUM 200 MG/2ML IV SOLN
INTRAVENOUS | Status: DC | PRN
  Administered 2023-10-11: 400 mg via INTRAVENOUS

## 2023-10-11 MED ORDER — ATROPINE SULFATE 1 MG/10ML IJ SOSY
PREFILLED_SYRINGE | INTRAMUSCULAR | Status: DC | PRN
Start: 2023-10-11 — End: 2023-10-11
  Administered 2023-10-11: 1 mg via INTRAVENOUS

## 2023-10-11 MED ORDER — PHENYLEPHRINE 80 MCG/ML (10ML) SYRINGE FOR IV PUSH (FOR BLOOD PRESSURE SUPPORT)
PREFILLED_SYRINGE | INTRAVENOUS | Status: DC | PRN
  Administered 2023-10-11: 160 ug via INTRAVENOUS

## 2023-10-11 MED ORDER — SODIUM CHLORIDE 0.9 % IV SOLN: 250.0000 mL | INTRAVENOUS | Status: DC | PRN

## 2023-10-11 MED ORDER — SODIUM CHLORIDE 0.9% FLUSH: 3.0000 mL | INTRAVENOUS | Status: DC | PRN

## 2023-10-11 MED ORDER — SODIUM CHLORIDE 0.9 % IV SOLN: INTRAVENOUS | Status: DC

## 2023-10-11 MED ORDER — PROPOFOL 10 MG/ML IV BOLUS
INTRAVENOUS | Status: DC | PRN
  Administered 2023-10-11: 30 mg via INTRAVENOUS
  Administered 2023-10-11: 100 mg via INTRAVENOUS

## 2023-10-11 MED ORDER — LACTATED RINGERS IV SOLN: INTRAVENOUS | Status: DC | PRN

## 2023-10-11 MED ORDER — EPHEDRINE SULFATE-NACL 50-0.9 MG/10ML-% IV SOSY
PREFILLED_SYRINGE | INTRAVENOUS | Status: DC | PRN
  Administered 2023-10-11: 5 mg via INTRAVENOUS
  Administered 2023-10-11: 10 mg via INTRAVENOUS

## 2023-10-11 MED ORDER — ACETAMINOPHEN 500 MG PO TABS
1000.0000 mg | ORAL_TABLET | Freq: Once | ORAL | Status: AC
  Administered 2023-10-11: 1000 mg via ORAL
  Filled 2023-10-11: qty 2

## 2023-10-11 MED ORDER — ACETAMINOPHEN 325 MG PO TABS: 650.0000 mg | ORAL_TABLET | ORAL | Status: DC | PRN

## 2023-10-11 MED ORDER — PROTAMINE SULFATE 10 MG/ML IV SOLN
INTRAVENOUS | Status: DC | PRN
  Administered 2023-10-11 (×5): 10 mg via INTRAVENOUS

## 2023-10-11 MED ORDER — HEPARIN (PORCINE) IN NACL 1000-0.9 UT/500ML-% IV SOLN
INTRAVENOUS | Status: DC | PRN
  Administered 2023-10-11 (×3): 500 mL

## 2023-10-11 MED ORDER — FENTANYL CITRATE (PF) 250 MCG/5ML IJ SOLN
INTRAMUSCULAR | Status: DC | PRN
  Administered 2023-10-11 (×2): 50 ug via INTRAVENOUS

## 2023-10-11 MED ORDER — HEPARIN SODIUM (PORCINE) 1000 UNIT/ML IJ SOLN
INTRAMUSCULAR | Status: DC | PRN
  Administered 2023-10-11: 16000 [IU] via INTRAVENOUS
  Administered 2023-10-11: 2000 [IU] via INTRAVENOUS

## 2023-10-11 NOTE — Anesthesia Procedure Notes (Signed)
 Procedure Name: Intubation Date/Time: 10/11/2023 7:40 AM  Performed by: Jerl Donald LABOR, CRNAPre-anesthesia Checklist: Patient identified, Emergency Drugs available, Suction available and Patient being monitored Patient Re-evaluated:Patient Re-evaluated prior to induction Oxygen Delivery Method: Circle System Utilized Preoxygenation: Pre-oxygenation with 100% oxygen Induction Type: IV induction Ventilation: Mask ventilation without difficulty Laryngoscope Size: Mac and 4 Grade View: Grade I Tube type: Oral Tube size: 7.5 mm Number of attempts: 1 Airway Equipment and Method: Stylet and Oral airway Placement Confirmation: ETT inserted through vocal cords under direct vision, positive ETCO2 and breath sounds checked- equal and bilateral Secured at: 25 cm Tube secured with: Tape Dental Injury: Teeth and Oropharynx as per pre-operative assessment

## 2023-10-11 NOTE — H&P (Signed)
 Electrophysiology Office Note:    Date:  10/11/2023   ID:  [name], DOB 1950/12/08, MRN [id number]  PCP:  Dwight Trula SQUIBB, MD   [facility] Providers Cardiologist:  Oneil Parchment, MD     Referring MD: No ref. provider found   History of Present Illness:    [name] is a 73 y.o. male with a medical history significant for atrial fibrillation, bradycardia coronary disease status post LAD stent, diabetes, hypertension referred for arrhythmia management.      Discussed the use of AI scribe software for clinical note transcription with the patient, who gave verbal consent to proceed.  History of Present Illness [name] is a 73 year old male with atrial fibrillation, coronary artery disease, and hypertension who presents for rhythm management.  He was sent to the ER on June 27, 2023, with atrial flutter and underwent DC cardioversion. He has a history of coronary artery disease and underwent PCI to the proximal LAD. A PCI attempt on the RCA was unsuccessful due to inability to cross the lesion with the wire.  He awoke on June 27, 2023, feeling jittery with a heart rate in the 130s. A TTE on August 23, 2022, showed an LVEF of 60-65%, grade one diastolic dysfunction, mild aortic root dilation at 41 mm, and normal-sized atria.  He has episodes of heart rates below 40 during the night, although he has been regular. He has been hospitalized or in the ER multiple times due to abnormal heart rhythms.  He is currently on Eliquis  and Plavix , having been switched from 81 mg aspirin  to Plavix  and Eliquis  in early August last year after a new stent placement.         Today, he reports he feels well and has no complaints. I reviewed the patient's CT and labs. There was no LAA thrombus. he  has not missed any doses of anticoagulation, and he took his dose last night. There have been no changes in the patient's diagnoses, medications, or condition since our recent clinic visit.    EKGs/Labs/Other Studies Reviewed Today:     Echocardiogram:  TTE August 23, 2022 LVEF 60 to 65%.  Grade 1 diastolic dysfunction.  Mild aortic root dilation, 41 mm atria are normal in size   Cardiac catherization  Coronary angiogram 08/23/2022 Reviewed in epic.  Multiple lesions noted  EKG:         Physical Exam:    VS:  BP 133/69   Pulse (!) 50   Temp 98.9 F (37.2 C) (Oral)   Resp 16   Ht 6' 8 (2.032 m)   Wt 102.1 kg   SpO2 97%   BMI 24.72 kg/m     Wt Readings from Last 3 Encounters:  10/11/23 102.1 kg  08/20/23 103 kg  07/30/23 99.3 kg     GEN: Well nourished, well developed in no acute distress CARDIAC: RRR, no murmurs, rubs, gallops RESPIRATORY:  Normal work of breathing MUSCULOSKELETAL: no edema    ASSESSMENT & PLAN:     Atrial fibrillation Paroxysmal, has had ER visits with RVR Rates difficult to control We discussed management options.  Using a shared decision making approach we have opted to proceed with an early invasive strategy.  We discussed the indication, rationale, logistics, anticipated benefits, and potential risks of the ablation procedure including but not limited to -- bleed at the groin access site, chest pain, damage to nearby organs such as the diaphragm, lungs, or esophagus, need  for a drainage tube, or prolonged hospitalization. I explained that the risk for stroke, heart attack, need for open chest surgery, or even death is very low but not zero. he  expressed understanding and wishes to proceed.   Atrial flutter Will plan for CTI line at the time of ablation  Tachybradycardia syndrome With bifascicular block Need to avoid AV nodal blocking agents    Signed, Eulas FORBES Furbish, MD  10/11/2023 7:08 AM    [facility]

## 2023-10-11 NOTE — Transfer of Care (Signed)
 Immediate Anesthesia Transfer of Care Note  Patient: CORNELIUS MARULLO  Procedure(s) Performed: ATRIAL FIBRILLATION ABLATION  Patient Location: PACU and Cath Lab  Anesthesia Type:General  Level of Consciousness: awake and alert   Airway & Oxygen Therapy: Patient Spontanous Breathing and Patient connected to nasal cannula oxygen  Post-op Assessment: Report given to RN and Post -op Vital signs reviewed and stable  Post vital signs: Reviewed and stable  Last Vitals:  Vitals Value Taken Time  BP    Temp    Pulse 70 10/11/23 09:14  Resp 12 10/11/23 09:14  SpO2 99 % 10/11/23 09:14  Vitals shown include unfiled device data.  Last Pain:  Vitals:   10/11/23 0614  TempSrc:   PainSc: 0-No pain         Complications: There were no known notable events for this encounter.

## 2023-10-11 NOTE — Discharge Instructions (Signed)

## 2023-10-11 NOTE — Anesthesia Postprocedure Evaluation (Signed)
 Anesthesia Post Note  Patient: [name]  Procedure(s) Performed: ATRIAL FIBRILLATION ABLATION     Patient location during evaluation: Cath Lab Anesthesia Type: General Level of consciousness: awake and alert, oriented and patient cooperative Pain management: pain level controlled Vital Signs Assessment: post-procedure vital signs reviewed and stable Respiratory status: spontaneous breathing, nonlabored ventilation and respiratory function stable Cardiovascular status: blood pressure returned to baseline and stable Postop Assessment: no apparent nausea or vomiting Anesthetic complications: no   There were no known notable events for this encounter.  Last Vitals:  Vitals:   10/11/23 1015 10/11/23 1030  BP: (!) 101/53 (!) 103/59  Pulse: 69 60  Resp: 18 11  Temp:    SpO2: 92% 94%    Last Pain:  Vitals:   10/11/23 1002  TempSrc:   PainSc: 3                  Jahn Franchini,E. Wilfred Siverson

## 2023-10-12 ENCOUNTER — Telehealth (HOSPITAL_COMMUNITY): Payer: Self-pay

## 2023-10-12 ENCOUNTER — Encounter (HOSPITAL_COMMUNITY): Payer: Self-pay | Admitting: Cardiovascular Disease

## 2023-10-12 MED FILL — Fentanyl Citrate Preservative Free (PF) Inj 100 MCG/2ML: INTRAMUSCULAR | Qty: 2 | Status: AC

## 2023-10-12 NOTE — Telephone Encounter (Signed)
 Spoke with patient to complete post procedure follow up call.  Patient reports no complications with groin sites.   Instructions reviewed with patient:  Remove large bandage at puncture site after 24 hours. It is normal to have bruising, tenderness, mild swelling, and a pea or marble sized lump/knot at the groin site which can take up to three months to resolve.  Get help right away if you notice sudden swelling at the puncture site.  Check your puncture site every day for signs of infection: fever, redness, swelling, pus drainage, warmth, foul odor or excessive pain. If this occurs, please call [phone], to speak with the RN Navigator. Get help right away if your puncture site is bleeding and the bleeding does not stop after applying firm pressure to the area.  You may continue to have skipped beats/ atrial fibrillation during the first several months after your procedure.  It is very important not to miss any doses of your blood thinner Eliquis .    You will follow up with the Afib clinic on 11/08/23 and follow up with  Dr.Augustus Mealor on 01/10/24.     Patient verbalized understanding to all instructions provided.

## 2023-10-13 ENCOUNTER — Other Ambulatory Visit: Payer: Self-pay | Admitting: Orthopaedic Surgery

## 2023-10-13 MED ORDER — METHYLPREDNISOLONE 4 MG PO TABS: ORAL_TABLET | ORAL | 0 refills | Status: DC

## 2023-10-24 NOTE — Therapy (Signed)
 OUTPATIENT PHYSICAL THERAPY UPPER EXTREMITY EVALUATION   Patient Name: [name] MRN: [id number] DOB:Jan 23, 1951, 73 y.o., male Today's Date: 10/25/2023  END OF SESSION:  PT End of Session - 10/25/23 1057     Visit Number 1    Number of Visits 8    Date for Recertification  11/22/23    Authorization Type MEDICARE AND BCBS    Progress Note Due on Visit 10    PT Start Time 1106    PT Stop Time 1144    PT Time Calculation (min) 38 min    Activity Tolerance Patient tolerated treatment well    Behavior During Therapy WFL for tasks assessed/performed          Past Medical History:  Diagnosis Date   Anemia    low iron   Arthritis    Coronary artery disease 2006   a. remote stenting to ramus, prox LAD x2.   Dental crowns present    Diabetes mellitus    First degree AV block    GERD (gastroesophageal reflux disease)    Heart attack (HCC) 06/2004   Heart murmur    aortic   Hx MRSA infection 02/2006   Hyperlipidemia    Hypertension    hx. of - has not been on med. since losing wt. after gastric bypass   Hypothyroidism    Morbid obesity (HCC)    Mucoid cyst of joint 09/2011   left thumb   Sleep apnea    sleep study 03/29/2011; no CPAP use, lost >130lbs   Trifascicular block    Past Surgical History:  Procedure Laterality Date   AMPUTATION Left 04/19/2016   Procedure: Left Foot 4th Toe Amputation vs. Transmetatarsal;  Surgeon: Jerona Harden GAILS, MD;  Location: [facility] OR;  Service: Orthopedics;  Laterality: Left;   ATRIAL FIBRILLATION ABLATION N/A 10/11/2023   Procedure: ATRIAL FIBRILLATION ABLATION;  Surgeon: Nancey Eulas BRAVO, MD;  Location: [facility];  Service: Cardiovascular;  Laterality: N/A;   BIOPSY  04/07/2019   Procedure: BIOPSY;  Surgeon: Elicia Claw, MD;  Location: [facility];  Service: Gastroenterology;;   CATARACT EXTRACTION  02/2010; 03/2010   COLONOSCOPY WITH PROPOFOL  N/A 09/14/2014   Procedure: COLONOSCOPY WITH PROPOFOL ;  Surgeon: Gladis MARLA Louder, MD;   Location: [facility];  Service: Endoscopy;  Laterality: N/A;   COLONOSCOPY WITH PROPOFOL  N/A 04/07/2019   Procedure: COLONOSCOPY WITH PROPOFOL ;  Surgeon: Elicia Claw, MD;  Location: [facility];  Service: Gastroenterology;  Laterality: N/A;   CORONARY ANGIOPLASTY WITH STENT PLACEMENT  07/12/2004; 08/03/2004   total of 3 stents   CORONARY STENT INTERVENTION N/A 08/23/2022   Procedure: CORONARY STENT INTERVENTION;  Surgeon: Swaziland, Peter M, MD;  Location: [facility] INVASIVE CV LAB;  Service: Cardiovascular;  Laterality: N/A;   I & D EXTREMITY Right 06/04/2014   Procedure: IRRIGATION AND DEBRIDEMENT EXTREMITY;  Surgeon: Lonni CINDERELLA Poli, MD;  Location: [facility] OR;  Service: Orthopedics;  Laterality: Right;   LEFT HEART CATH AND CORONARY ANGIOGRAPHY N/A 08/23/2022   Procedure: LEFT HEART CATH AND CORONARY ANGIOGRAPHY;  Surgeon: Swaziland, Peter M, MD;  Location: [facility] INVASIVE CV LAB;  Service: Cardiovascular;  Laterality: N/A;   MASS EXCISION  10/04/2011   Procedure: EXCISION MASS;  Surgeon: Arley JONELLE Curia, MD;  Location: [facility];  Service: Orthopedics;  Laterality: Left;  excision cyst debridment ip joint of left thumb   PROSTATECTOMY     right below knee amputation  2018   ROUX-EN-Y GASTRIC BYPASS  10/10/2010  laparoscopic   SHOULDER ARTHROSCOPY WITH ROTATOR CUFF REPAIR AND SUBACROMIAL DECOMPRESSION Right 03/23/2020   Procedure: RIGHT SHOULDER ARTHROSCOPY WITH DEBRIDEMENT AND  ROTATOR CUFF REPAIR;  Surgeon: Vernetta Lonni GRADE, MD;  Location: [facility] OR;  Service: Orthopedics;  Laterality: Right;   TOE AMPUTATION  02/23/2006   left foot second ray amputation   TOTAL HIP ARTHROPLASTY Right 03/29/2020   Procedure: TOTAL HIP ARTHROPLASTY POSTERIOR APPROACH;  Surgeon: Jerri Kay HERO, MD;  Location: [facility] OR;  Service: Orthopedics;  Laterality: Right;   TRANSMETATARSAL AMPUTATION Left    TRIGGER FINGER RELEASE Right 09/16/2019   Procedure: RELEASE TRIGGER FINGER/A-1 PULLEY;  Surgeon: Murrell Kuba, MD;   Location: [facility];  Service: Orthopedics;  Laterality: Right;  IV REGIONAL FOREARM BLOCK   Patient Active Problem List   Diagnosis Date Noted   Atrial flutter (HCC) 06/27/2023   Paroxysmal atrial fibrillation with RVR (HCC) 08/23/2022   Ascending aortic aneurysm 08/23/2022   Bradycardia 08/22/2022   NSTEMI (non-ST elevated myocardial infarction) (HCC) 08/22/2022   Atrial fibrillation with RVR (HCC) 08/22/2022   Hypotension 01/07/2021   S/P arthroscopy of right shoulder 06/02/2020   Closed fracture of neck of right femur (HCC)    Hip fracture (HCC) 03/27/2020   Nontraumatic complete tear of right rotator cuff 02/26/2020   S/P transmetatarsal amputation of foot, left (HCC) 04/25/2016   Chronic osteomyelitis of toe, left (HCC)    Chronic diastolic CHF (congestive heart failure) (HCC) 09/25/2015   Aortic stenosis 08/04/2014   Diabetic foot infection (HCC) 06/03/2014   Peripheral vascular disease 06/03/2014   Coronary artery disease involving native coronary artery of native heart without angina pectoris 07/29/2013   Trifascicular block 07/29/2013   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 07/26/2011   Hypothyroidism 01/22/2007   Type 2 diabetes mellitus with vascular disease (HCC) 01/22/2007   HYPERLIPIDEMIA, MIXED 01/22/2007   MYOCARDIAL INFARCTION, HX OF 01/22/2007   ANEMIA, IRON DEFICIENCY, HX OF 01/22/2007    PCP: Trula SHAUNNA Brim, MD   REFERRING PROVIDER: Cordella Glendia Hutchinson, MD   REFERRING DIAG: [phone] (ICD-10-CM) - Chronic right shoulder pain   THERAPY DIAG:  Chronic right shoulder pain  Stiffness of right shoulder, not elsewhere classified  Other abnormalities of gait and mobility  Pain in left hip  Muscle weakness (generalized)  Rationale for Evaluation and Treatment: Rehabilitation  ONSET DATE: spring 2025  SUBJECTIVE:   SUBJECTIVE STATEMENT: It all is very convoluted.   PERTINENT HISTORY: Patient has undergone Rt TTA, Rt shoulder  arthroscopy, and Rt THA. Currently having Rt shoulder pain after 1 fall in the spring where he landed on Rt shoulder. Patient is now experiencing pain in Rt shoulder with lifting and sleeping. Patient also reports Lt hip pain that is currently being treated with injection, tramadol , and prednisone.    See PMH or personal factors for comorbidities  PAIN:  Are you having pain? Yes: NPRS scale: 0/10 at rest, 8-9/10 with lifting/activity Pain location: Rt shoulder, bicep with reaching  Pain description: sharp Aggravating factors: sleeping, lifting, reaching Relieving factors: rest, injection  PRECAUTIONS: Fall and Other: cardiac  RED FLAGS: None   WEIGHT BEARING RESTRICTIONS: No  FALLS:  Has patient fallen in last 6 months? Yes. Number of falls 1; thought he was locked into the prosthesis and was not    LIVING ENVIRONMENT: Lives with: lives with their spouse Lives in: House/apartment Stairs: Yes: Internal: 12 steps; rail/wall and External: 1 to get in the house from garage, 3 steps to  patio steps; one rail on front, double to patio  Has following equipment at home: unable to assess on eval  OCCUPATION: retired, IT trainer  PLOF: Independent and Rt TTA prosthesis   PATIENT GOALS: get rid of the pain to lift and move things around  NEXT MD VISIT: 10/26/2023 for injection in Lt hip   OBJECTIVE:  Note: Objective measures were completed at Evaluation unless otherwise noted.  DIAGNOSTIC FINDINGS:  IMPRESSION: 1. Mild spurring of the left acetabulum and femoral head. 2. Mild proximal hamstring tendinopathy. 3. Trace nonspecific edema medially along the hip adductor musculature. 4. Contralateral (right) total hip prosthesis.  PATIENT SURVEYS:  PSFS: THE PATIENT SPECIFIC FUNCTIONAL SCALE  Place score of 0-10 (0 = unable to perform activity and 10 = able to perform activity at the same level as before injury or problem)  Activity Date: 10/25/2023    Raising right arm above shoulder  5     2. Carrying load with right arm  4    3. Sleeping on right side  4    4.      Total Score 4.33      Total Score = Sum of activity scores/number of activities  Minimally Detectable Change: 3 points (for single activity); 2 points (for average score)  [facility] (nd). The Patient Specific Functional Scale . Retrieved from [email or web address]   COGNITION: Overall cognitive status: Within functional limits for tasks assessed     SENSATION: Not formally assessed on eval  EDEMA:    MUSCLE LENGTH: Not formally assessed on eval secondary to focus on UE  POSTURE: rounded shoulders, forward head, and increased thoracic kyphosis  PALPATION: Not formally assessed on eval  LOWER EXTREMITY ROM: not assessed on eval secondary to Rt UE focus   ROM Right Eval 10/25/2023 Left Eval 10/25/2023  Hip flexion    Hip extension    Hip abduction    Hip adduction    Hip internal rotation    Hip external rotation    Knee flexion    Knee extension    Ankle dorsiflexion    Ankle plantarflexion    Ankle inversion    Ankle eversion     (Blank rows = not tested)  LOWER EXTREMITY MMT: not assessed on eval secondary to Rt UE focus   MMT Right eval Left eval  Hip flexion    Hip extension    Hip abduction    Hip adduction    Hip internal rotation    Hip external rotation    Knee flexion    Knee extension    Ankle dorsiflexion    Ankle plantarflexion    Ankle inversion    Ankle eversion     (Blank rows = not tested)  LOWER EXTREMITY SPECIAL TESTS:  Not formally assessed on eval   Upper Extremity ROM: 10/25/2023 SEATED , AROM Shoulder flexion: Rt: 80deg Lt: WFL Shoulder abduction: Rt: 72deg Lt: WFL Shoulder ER: Rt: L3 Lt: T3 Shoulder IR: Rt: T1 Lt: T10  Upper Extremity MMT:  SEATED ; Rt within available ROM  Shoulder flexion: Rt: 4+/5 Lt: 4+/5 Shoulder abduction: Rt:4+/5 Lt: 4+/5 Shoulder ER: Rt: 3+/5 Lt:  3+/5 Shoulder IR: Rt:3+/5  Lt: 4-/5  UE special tests: 10/25/2023 Full can: negative Empty can: postivie, tension and painful   FUNCTIONAL TESTS:  Not formally assessed on eval  GAIT: Distance walked: not formally assessed  Assistive device utilized: Rt TTA prosthesis  Level of assistance: supervision Comments: Rt TTA prosthesis appears to be  short                                                                                                                                TREATMENT DATE:  10/25/2023 TherEx:  HEP handout provided with patient performing one set of each activity for appropriate form. Verbal and tactile cues required.   Self-Care: POC and addition of hip to POC if deemed necessary    PATIENT EDUCATION:  Education details: HEP, POC Person educated: Patient Education method: Explanation, Demonstration, Tactile cues, Verbal cues, and Handouts Education comprehension: verbalized understanding and returned demonstration  HOME EXERCISE PROGRAM: Access Code: [email or web address] URL: [email or web address] Date: 10/25/2023 Prepared by: Susannah Daring  Exercises - Seated Cervical Retraction  - 1 x daily - 7 x weekly - 2 sets - 10 reps - 3s hold - Seated Scapular Retraction  - 1 x daily - 7 x weekly - 2 sets - 10 reps - 3s hold - Supine Shoulder Flexion with Dowel  - 1 x daily - 7 x weekly - 2 sets - 10 reps - 3s hold - Supine Shoulder Abduction AAROM with Dowel  - 1 x daily - 7 x weekly - 2 sets - 10 reps - 3s hold  ASSESSMENT:  CLINICAL IMPRESSION: Patient is a 73 y.o. M who was seen today for physical therapy evaluation and treatment for Rt shoulder pain with functional mobility deficits, motor coordination deficits, strength deficits, and pain. Patient also notes currently being treated by MD for Lt hip pain and will potentially require care for that. Patient will benefit from skilled PT in order to address above noted deficits.   OBJECTIVE IMPAIRMENTS:  Abnormal gait, decreased activity tolerance, decreased balance, decreased coordination, decreased mobility, difficulty walking, decreased ROM, decreased strength, impaired UE functional use, improper body mechanics, postural dysfunction, prosthetic dependency , and pain.   ACTIVITY LIMITATIONS: carrying, lifting, sleeping, bed mobility, and reach over head  PARTICIPATION LIMITATIONS: cleaning, community activity, and church  PERSONAL FACTORS: Past/current experiences and 3+ comorbidities: hypothyroidism, HTN, HLD, GERD, A-fib (s/p ablation), DM2, arthritis, PVD, CAD are also affecting patient's functional outcome.   REHAB POTENTIAL: Good  CLINICAL DECISION MAKING: Evolving/moderate complexity  EVALUATION COMPLEXITY: Moderate   GOALS: Goals reviewed with patient? Yes  SHORT TERM GOALS: Target date: 11/08/2023 Patient will show compliance with initial HEP. Baseline: Goal status: INITIAL  2.  Patient will report pain levels no greater than 6/10 in order to show improved overall quality of life. Baseline:  Goal status: INITIAL    LONG TERM GOALS: Target date: 11/22/2023  Patient will be independent with final HEP in order to maintain and progress upon functional gains made within PT. Baseline:  Goal status: INITIAL  2.  Patient will report pain levels no greater than 3/10 in order to show improved overall quality of life. Baseline:  Goal status: INITIAL  3.  Patient will increase PSFS to at least 6.33 in order to show a significant  improvement in subjective disability rating. Baseline:  Goal status: INITIAL  4.  Patient will increase Rt shoulder flexion ROM to at least 110deg in order to improve functional mobility. Baseline:  Goal status: INITIAL  5.  Patient will increase Rt shoulder abduction ROM to at least 95deg in order to improve functional mobility. Baseline:  Goal status: INITIAL     PLAN:  PT FREQUENCY: 1-2x/week  PT DURATION: 4 weeks  PLANNED  INTERVENTIONS: [id number]- PT Re-evaluation, 97750- Physical Performance Testing, 97110-Therapeutic exercises, 97530- Therapeutic activity, [id number]- Neuromuscular re-education, 97535- Self Care, [id number]- Manual therapy, [id number]- Gait training, [id number]- Prosthetic Initial , [id number]- Orthotic/Prosthetic subsequent, [phone]- Canalith repositioning, [id number]- Aquatic Therapy, H9716- Electrical stimulation (unattended), [phone]- Electrical stimulation (manual), [id number]- Vasopneumatic device, [id number]- Ultrasound, [id number]- Traction (mechanical), [id number]- Ionotophoresis 4mg /ml Dexamethasone , 97597- Wound care (first 20 sq cm), 97598- Wound care (each additional 20 sq cm), 20560 (1-2 muscles), 20561 (3+ muscles)- Dry Needling, Patient/Family education, Balance training, Stair training, Taping, Joint mobilization, Joint manipulation, Spinal manipulation, Spinal mobilization, Scar mobilization, Compression bandaging, Vestibular training, DME instructions, Cryotherapy, and Moist heat  PLAN FOR NEXT SESSION: assess Lt hip if necessary, generalized mobility and strengthening for Rt shoulder, postural strengthening   Susannah Daring, PT, DPT 10/25/23 4:39 PM

## 2023-10-25 ENCOUNTER — Ambulatory Visit (INDEPENDENT_AMBULATORY_CARE_PROVIDER_SITE_OTHER)

## 2023-10-25 DIAGNOSIS — G8929 Other chronic pain: Secondary | ICD-10-CM

## 2023-10-25 DIAGNOSIS — R2689 Other abnormalities of gait and mobility: Secondary | ICD-10-CM | POA: Diagnosis not present

## 2023-10-25 DIAGNOSIS — M25552 Pain in left hip: Secondary | ICD-10-CM

## 2023-10-25 DIAGNOSIS — M6281 Muscle weakness (generalized): Secondary | ICD-10-CM

## 2023-10-25 DIAGNOSIS — M25511 Pain in right shoulder: Secondary | ICD-10-CM

## 2023-10-25 DIAGNOSIS — M25611 Stiffness of right shoulder, not elsewhere classified: Secondary | ICD-10-CM | POA: Diagnosis not present

## 2023-10-26 ENCOUNTER — Ambulatory Visit: Admitting: Sports Medicine

## 2023-10-26 ENCOUNTER — Encounter: Payer: Self-pay | Admitting: Sports Medicine

## 2023-10-26 ENCOUNTER — Other Ambulatory Visit: Payer: Self-pay

## 2023-10-26 DIAGNOSIS — M25552 Pain in left hip: Secondary | ICD-10-CM

## 2023-10-26 DIAGNOSIS — G8929 Other chronic pain: Secondary | ICD-10-CM

## 2023-10-26 DIAGNOSIS — E1159 Type 2 diabetes mellitus with other circulatory complications: Secondary | ICD-10-CM

## 2023-10-26 MED ORDER — LIDOCAINE HCL 1 % IJ SOLN
4.0000 mL | INTRAMUSCULAR | Status: AC | PRN
  Administered 2023-10-26: 4 mL

## 2023-10-26 MED ORDER — METHYLPREDNISOLONE ACETATE 40 MG/ML IJ SUSP
60.0000 mg | INTRAMUSCULAR | Status: AC | PRN
  Administered 2023-10-26: 60 mg via INTRA_ARTICULAR

## 2023-10-26 NOTE — Addendum Note (Signed)
 Addended by: Christeena Krogh W III on: 10/26/2023 08:56 AM   Modules accepted: Level of Service

## 2023-10-26 NOTE — Progress Notes (Signed)
 [name] - 73 y.o. male MRN [id number]  Date of birth: Oct 30, 1950  Office Visit Note: Visit Date: 10/26/2023 PCP: Dwight Trula SQUIBB, MD Referred by: Dwight Trula SQUIBB, MD  Subjective: Chief Complaint  Patient presents with   Left Hip - Pain   HPI: [name] is a pleasant 73 y.o. male who presents today for evaluation of left hip pain.  He initially was having some lateral based hip pain and on 10/01/23 Dr. Vernetta did perform a greater trochanteric injection, this did not provide him much relief.  Dr. Vernetta did provide him a Medrol  Dosepak which certainly helped his pain quite significantly over the first week or so, his pain has then somewhat returned but certainly not as severe as it was weeks ago.  Still over more of the posterior lateral hip but he does feel some achy pain radiating down the left thigh.  Denies any specific numbness or tingling.  He is meeting with his prosthesis individual to ensure his leg lengths are equal and not contributing to his contralateral left hip pain.  He is a type II diabetic, although is relatively well-controlled.  He is on metformin  500 mg twice daily. Lab Results  Component Value Date   HGBA1C 6.7 (H) 08/24/2022   Recently underwent successful cardiac ablation.  Currently continues on Eliquis  5 mg twice daily as well as Plavix .  Pertinent ROS were reviewed with the patient and found to be negative unless otherwise specified above in HPI.   Assessment & Plan: Visit Diagnoses:  1. Chronic left hip pain   2. Type 2 diabetes mellitus with vascular disease (HCC)    Plan: Impression is chronic left hip pain which is more so over the posterior lateral hip, however greater trochanteric injection did not provide relief.  He did receive good relief temporarily from Medrol  Dosepak.  For both diagnostic and therapeutic purposes, we did proceed with ultrasound-guided left hip intra-articular injection.  Advised on postinjection protocol.  May use ice/heat  and Tylenol  for pain control.  I would like Taishaun to pay attention over the next 2 weeks to what degree his pain is relieved and he will notify/update Dr. Vernetta going forward.  If for some reason this does not give him good relief, may be smart to evaluate the lumbar spine as he does have pain going down the leg but not true radicular symptoms of numbness or tingling.  He will meet with his leg prosthesis team to adjust this to ensure his gait is not causing functional left-sided hip pain.  He will follow-up with Dr. Vernetta regarding the hip, I am happy to see him back as needed.  Follow-up: Return for F/u with Dr. Vernetta as needed.   Meds & Orders: No orders of the defined types were placed in this encounter.   Orders Placed This Encounter  Procedures   Large Joint Inj   US  Guided Needle Placement - No Linked Charges     Procedures: Large Joint Inj: L hip joint on 10/26/2023 8:19 AM Indications: pain Details: 22 G 3.5 in needle, ultrasound-guided anterior approach Medications: 4 mL lidocaine  1 %; 60 mg methylPREDNISolone  acetate 40 MG/ML Outcome: tolerated well, no immediate complications  Procedure: US -guided intra-articular hip injection, Left After discussion on risks/benefits/indications and informed verbal consent was obtained, a timeout was performed. Patient was lying supine on exam table. The hip was cleaned with betadine  and alcohol swabs. Then utilizing ultrasound guidance, the patient's femoral head and neck junction was identified  and subsequently injected with 4:1.5 lidocaine :depomedrol via an in-plane approach with ultrasound visualization of the injectate administered into the hip joint. Patient tolerated procedure well without immediate complications.  Procedure, treatment alternatives, risks and benefits explained, specific risks discussed. Consent was given by the patient. Immediately prior to procedure a time out was called to verify the correct patient, procedure,  equipment, support staff and site/side marked as required. Patient was prepped and draped in the usual sterile fashion.          Clinical History: No specialty comments available.  He reports that he has never smoked. He has never used smokeless tobacco. No results for input(s): HGBA1C, LABURIC in the last 8760 hours.  Objective:    Physical Exam  Gen: Well-appearing, in no acute distress; non-toxic CV: Well-perfused. Warm.  Resp: Breathing unlabored on room air; no wheezing. Psych: Fluid speech in conversation; appropriate affect; normal thought process  Ortho Exam - Left hip: No redness swelling or effusion about the hip.  There are no mechanical blocks to internal or external rotation.  Equivocal Stinchfield test, negative FADIR test.  Imaging:  *Independent review and interpretation of left hip MRI demonstrates mild articular cartilage loss with early OA findings although no significant arthropathy.  Incompletely visualized degenerative labrum.  Contralateral right hip prosthesis.  No acute fracture.  Narrative & Impression  CLINICAL DATA:  Chronic left hip pain   EXAM: MR OF THE LEFT HIP WITHOUT CONTRAST   TECHNIQUE: Multiplanar, multisequence MR imaging was performed. No intravenous contrast was administered.   COMPARISON:  Radiographs 09/27/2023   FINDINGS: Bones: Contralateral (right) total hip prosthesis.   Mild spurring of the left acetabulum and femoral head. No significant regional marrow edema identified. No acute bony findings.   Articular cartilage and labrum   Articular cartilage: Preserved articular cartilage for age, without focal chondral defect identified.   Labrum:  Grossly intact.  No paralabral cyst.   Joint or bursal effusion   Joint effusion:  Absent   Bursae: No regional bursitis.   Muscles and tendons   Muscles and tendons: Mild proximal hamstring tendinopathy. Trace nonspecific edema medially along the hip adductor musculature.  No findings of ischiofemoral impingement.   Other findings   Miscellaneous:   No supplemental non-categorized findings.   IMPRESSION: 1. Mild spurring of the left acetabulum and femoral head. 2. Mild proximal hamstring tendinopathy. 3. Trace nonspecific edema medially along the hip adductor musculature. 4. Contralateral (right) total hip prosthesis.     Electronically Signed   By: Ryan Salvage M.D.   On: 10/12/2023 08:48    Past Medical/Family/Surgical/Social History: Medications & Allergies reviewed per EMR, new medications updated. Patient Active Problem List   Diagnosis Date Noted   Atrial flutter (HCC) 06/27/2023   Paroxysmal atrial fibrillation with RVR (HCC) 08/23/2022   Ascending aortic aneurysm 08/23/2022   Bradycardia 08/22/2022   NSTEMI (non-ST elevated myocardial infarction) (HCC) 08/22/2022   Atrial fibrillation with RVR (HCC) 08/22/2022   Hypotension 01/07/2021   S/P arthroscopy of right shoulder 06/02/2020   Closed fracture of neck of right femur (HCC)    Hip fracture (HCC) 03/27/2020   Nontraumatic complete tear of right rotator cuff 02/26/2020   S/P transmetatarsal amputation of foot, left (HCC) 04/25/2016   Chronic osteomyelitis of toe, left (HCC)    Chronic diastolic CHF (congestive heart failure) (HCC) 09/25/2015   Aortic stenosis 08/04/2014   Diabetic foot infection (HCC) 06/03/2014   Peripheral vascular disease 06/03/2014   Coronary artery disease involving native coronary  artery of native heart without angina pectoris 07/29/2013   Trifascicular block 07/29/2013   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  remote stenting to ramus, prox LAD x2.   Dental crowns present    Diabetes mellitus    First degree AV block    GERD (gastroesophageal reflux disease)    Heart attack (HCC) 06/2004   Heart murmur    aortic   Hx MRSA infection 02/2006   Hyperlipidemia    Hypertension    hx. of - has not been on med. since losing wt. after gastric bypass   Hypothyroidism    Morbid obesity (HCC)    Mucoid cyst of joint 09/2011   left thumb   Sleep apnea    sleep study 03/29/2011; no CPAP use, lost >130lbs   Trifascicular block    Family History  Problem Relation Age of Onset   Heart disease Mother    Past Surgical History:  Procedure Laterality Date   AMPUTATION Left 04/19/2016   Procedure: Left Foot 4th Toe Amputation vs. Transmetatarsal;  Surgeon: Jerona Harden GAILS, MD;  Location: [facility] OR;  Service: Orthopedics;  Laterality: Left;   ATRIAL FIBRILLATION ABLATION N/A 10/11/2023   Procedure: ATRIAL FIBRILLATION ABLATION;  Surgeon: Nancey Eulas BRAVO, MD;  Location: [facility]AS[facility] CV [facility];  Service: Cardiovascular;  Laterality: N/A;   BIOPSY  04/07/2019   Procedure: BIOPSY;  Surgeon: Elicia Claw, MD;  Location: [facility];  Service: Gastroenterology;;   CATARACT EXTRACTION  02/2010; 03/2010   COLONOSCOPY WITH PROPOFOL  N/A 09/14/2014   Procedure: COLONOSCOPY WITH PROPOFOL ;  Surgeon: Gladis MARLA Louder, MD;  Location: [facility];  Service: Endoscopy;  Laterality: N/A;   COLONOSCOPY WITH PROPOFOL  N/A 04/07/2019   Procedure: COLONOSCOPY WITH PROPOFOL ;  Surgeon: Elicia Claw, MD;  Location: [facility];  Service: Gastroenterology;  Laterality: N/A;   CORONARY ANGIOPLASTY WITH STENT PLACEMENT  07/12/2004; 08/03/2004   total of 3 stents   CORONARY STENT INTERVENTION N/A 08/23/2022   Procedure: CORONARY STENT INTERVENTION;  Surgeon: Swazil[place], Peter M, MD;  Location: [facility] IN[facility];  Service: Cardiovascular;  Laterality: N/A;   I & D EXTREMITY Right 06/04/2014   Procedure: IRRIGATION AND DEBRIDEMENT EXTREMITY;   Surgeon: Lonni CINDERELLA Poli, MD;  Location: [facility] OR;  Service: Orthopedics;  Laterality: Right;   LEFT HEART CATH AND CORONARY ANGIOGRAPHY N/A 08/23/2022   Procedure: LEFT HEART CATH AND CORONARY ANGIOGRAPHY;  Surgeon: Swazil[place], Peter M, MD;  Location: [facility] INVASIVE CV LAB;  Service: Cardiovascular;  Laterality: N/A;   MASS EXCISION  10/04/2011   Procedure: EXCISION MASS;  Surgeon: Arley JONELLE Curia, MD;  Location: [facility];  Service: Orthopedics;  Laterality: Left;  excision cyst debridment ip joint of left thumb   PROSTATECTOMY     right below knee amputation  2018   ROUX-EN-Y GASTRIC BYPASS  10/10/2010   laparoscopic   SHOULDER ARTHROSCOPY WITH ROTATOR CUFF REPAIR AND SUBACROMIAL DECOMPRESSION Right 03/23/2020   Procedure: RIGHT SHOULDER ARTHROSCOPY WITH DEBRIDEMENT AND  ROTATOR CUFF REPAIR;  Surgeon: Poli Lonni CINDERELLA, MD;  Location: [facility] OR;  Service: Orthopedics;  Laterality: Right;   TOE  AMPUTATION  02/23/2006   left foot second ray amputation   TOTAL HIP ARTHROPLASTY Right 03/29/2020   Procedure: TOTAL HIP ARTHROPLASTY POSTERIOR APPROACH;  Surgeon: Jerri Kay HERO, MD;  Location: [facility] OR;  Service: Orthopedics;  Laterality: Right;   TRANSMETATARSAL AMPUTATION Left    TRIGGER FINGER RELEASE Right 09/16/2019   Procedure: RELEASE TRIGGER FINGER/A-1 PULLEY;  Surgeon: Murrell Kuba, MD;  Location: [facility];  Service: Orthopedics;  Laterality: Right;  IV REGIONAL FOREARM BLOCK   Social History   Occupational History   Occupation: IT trainer taxes  Tobacco Use   Smoking status: Never   Smokeless tobacco: Never  Substance and Sexual Activity   Alcohol use: No   Drug use: No   Sexual activity: Yes

## 2023-10-29 ENCOUNTER — Ambulatory Visit

## 2023-10-29 DIAGNOSIS — M25611 Stiffness of right shoulder, not elsewhere classified: Secondary | ICD-10-CM

## 2023-10-29 DIAGNOSIS — M25511 Pain in right shoulder: Secondary | ICD-10-CM

## 2023-10-29 DIAGNOSIS — R2689 Other abnormalities of gait and mobility: Secondary | ICD-10-CM

## 2023-10-29 DIAGNOSIS — M6281 Muscle weakness (generalized): Secondary | ICD-10-CM

## 2023-10-29 DIAGNOSIS — G8929 Other chronic pain: Secondary | ICD-10-CM | POA: Diagnosis not present

## 2023-10-29 NOTE — Therapy (Signed)
 OUTPATIENT PHYSICAL THERAPY UPPER EXTREMITY TREATMENT   Patient Name: [name] MRN: [id number] DOB:03/09/1950, 73 y.o., male Today's Date: 10/29/2023  END OF SESSION:  PT End of Session - 10/29/23 1102     Visit Number 2    Number of Visits 8    Date for Recertification  11/22/23    Authorization Type MEDICARE AND BCBS    Progress Note Due on Visit 10    PT Start Time 1102    PT Stop Time 1144    PT Time Calculation (min) 42 min    Activity Tolerance Patient tolerated treatment well    Behavior During Therapy WFL for tasks assessed/performed          Past Medical History:  Diagnosis Date   Anemia    low iron   Arthritis    Coronary artery disease 2006   a. remote stenting to ramus, prox LAD x2.   Dental crowns present    Diabetes mellitus    First degree AV block    GERD (gastroesophageal reflux disease)    Heart attack (HCC) 06/2004   Heart murmur    aortic   Hx MRSA infection 02/2006   Hyperlipidemia    Hypertension    hx. of - has not been on med. since losing wt. after gastric bypass   Hypothyroidism    Morbid obesity (HCC)    Mucoid cyst of joint 09/2011   left thumb   Sleep apnea    sleep study 03/29/2011; no CPAP use, lost >130lbs   Trifascicular block    Past Surgical History:  Procedure Laterality Date   AMPUTATION Left 04/19/2016   Procedure: Left Foot 4th Toe Amputation vs. Transmetatarsal;  Surgeon: Jerona Harden GAILS, MD;  Location: [facility] OR;  Service: Orthopedics;  Laterality: Left;   ATRIAL FIBRILLATION ABLATION N/A 10/11/2023   Procedure: ATRIAL FIBRILLATION ABLATION;  Surgeon: Nancey Eulas BRAVO, MD;  Location: [facility];  Service: Cardiovascular;  Laterality: N/A;   BIOPSY  04/07/2019   Procedure: BIOPSY;  Surgeon: Elicia Claw, MD;  Location: [facility];  Service: Gastroenterology;;   CATARACT EXTRACTION  02/2010; 03/2010   COLONOSCOPY WITH PROPOFOL  N/A 09/14/2014   Procedure: COLONOSCOPY WITH PROPOFOL ;  Surgeon: Gladis MARLA Louder, MD;   Location: [facility];  Service: Endoscopy;  Laterality: N/A;   COLONOSCOPY WITH PROPOFOL  N/A 04/07/2019   Procedure: COLONOSCOPY WITH PROPOFOL ;  Surgeon: Elicia Claw, MD;  Location: [facility];  Service: Gastroenterology;  Laterality: N/A;   CORONARY ANGIOPLASTY WITH STENT PLACEMENT  07/12/2004; 08/03/2004   total of 3 stents   CORONARY STENT INTERVENTION N/A 08/23/2022   Procedure: CORONARY STENT INTERVENTION;  Surgeon: Swaziland, Peter M, MD;  Location: [facility] INVASIVE CV LAB;  Service: Cardiovascular;  Laterality: N/A;   I & D EXTREMITY Right 06/04/2014   Procedure: IRRIGATION AND DEBRIDEMENT EXTREMITY;  Surgeon: Lonni CINDERELLA Poli, MD;  Location: [facility] OR;  Service: Orthopedics;  Laterality: Right;   LEFT HEART CATH AND CORONARY ANGIOGRAPHY N/A 08/23/2022   Procedure: LEFT HEART CATH AND CORONARY ANGIOGRAPHY;  Surgeon: Swaziland, Peter M, MD;  Location: [facility] INVASIVE CV LAB;  Service: Cardiovascular;  Laterality: N/A;   MASS EXCISION  10/04/2011   Procedure: EXCISION MASS;  Surgeon: Arley JONELLE Curia, MD;  Location: [facility];  Service: Orthopedics;  Laterality: Left;  excision cyst debridment ip joint of left thumb   PROSTATECTOMY     right below knee amputation  2018   ROUX-EN-Y GASTRIC BYPASS  10/10/2010  laparoscopic   SHOULDER ARTHROSCOPY WITH ROTATOR CUFF REPAIR AND SUBACROMIAL DECOMPRESSION Right 03/23/2020   Procedure: RIGHT SHOULDER ARTHROSCOPY WITH DEBRIDEMENT AND  ROTATOR CUFF REPAIR;  Surgeon: Vernetta Lonni GRADE, MD;  Location: [facility] OR;  Service: Orthopedics;  Laterality: Right;   TOE AMPUTATION  02/23/2006   left foot second ray amputation   TOTAL HIP ARTHROPLASTY Right 03/29/2020   Procedure: TOTAL HIP ARTHROPLASTY POSTERIOR APPROACH;  Surgeon: Jerri Kay HERO, MD;  Location: [facility] OR;  Service: Orthopedics;  Laterality: Right;   TRANSMETATARSAL AMPUTATION Left    TRIGGER FINGER RELEASE Right 09/16/2019   Procedure: RELEASE TRIGGER FINGER/A-1 PULLEY;  Surgeon: Murrell Kuba, MD;   Location: [facility];  Service: Orthopedics;  Laterality: Right;  IV REGIONAL FOREARM BLOCK   Patient Active Problem List   Diagnosis Date Noted   Atrial flutter (HCC) 06/27/2023   Paroxysmal atrial fibrillation with RVR (HCC) 08/23/2022   Ascending aortic aneurysm 08/23/2022   Bradycardia 08/22/2022   NSTEMI (non-ST elevated myocardial infarction) (HCC) 08/22/2022   Atrial fibrillation with RVR (HCC) 08/22/2022   Hypotension 01/07/2021   S/P arthroscopy of right shoulder 06/02/2020   Closed fracture of neck of right femur (HCC)    Hip fracture (HCC) 03/27/2020   Nontraumatic complete tear of right rotator cuff 02/26/2020   S/P transmetatarsal amputation of foot, left (HCC) 04/25/2016   Chronic osteomyelitis of toe, left (HCC)    Chronic diastolic CHF (congestive heart failure) (HCC) 09/25/2015   Aortic stenosis 08/04/2014   Diabetic foot infection (HCC) 06/03/2014   Peripheral vascular disease 06/03/2014   Coronary artery disease involving native coronary artery of native heart without angina pectoris 07/29/2013   Trifascicular block 07/29/2013   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 07/26/2011   Hypothyroidism 01/22/2007   Type 2 diabetes mellitus with vascular disease (HCC) 01/22/2007   HYPERLIPIDEMIA, MIXED 01/22/2007   MYOCARDIAL INFARCTION, HX OF 01/22/2007   ANEMIA, IRON DEFICIENCY, HX OF 01/22/2007    PCP: Trula SHAUNNA Brim, MD   REFERRING PROVIDER: Cordella Glendia Hutchinson, MD   REFERRING DIAG: [id number] (ICD-10-CM) - Chronic right shoulder pain   THERAPY DIAG:  Chronic right shoulder pain  Stiffness of right shoulder, not elsewhere classified  Other abnormalities of gait and mobility  Muscle weakness (generalized)  Rationale for Evaluation and Treatment: Rehabilitation  ONSET DATE: spring 2025  SUBJECTIVE:   SUBJECTIVE STATEMENT: Patient noting difficulty and pain with shoulder abduction HEP activity.  PERTINENT HISTORY: Patient has  undergone Rt TTA, Rt shoulder arthroscopy, and Rt THA. Currently having Rt shoulder pain after 1 fall in the spring where he landed on Rt shoulder. Patient is now experiencing pain in Rt shoulder with lifting and sleeping. Patient also reports Lt hip pain that is currently being treated with injection, tramadol , and prednisone.    See PMH or personal factors for comorbidities  PAIN:  Are you having pain? Yes: NPRS scale: 0/10 this session Pain location: Rt shoulder, bicep with reaching  Pain description: sharp Aggravating factors: sleeping, lifting, reaching Relieving factors: rest, injection  PRECAUTIONS: Fall and Other: cardiac  RED FLAGS: None   WEIGHT BEARING RESTRICTIONS: No  FALLS:  Has patient fallen in last 6 months? Yes. Number of falls 1; thought he was locked into the prosthesis and was not    LIVING ENVIRONMENT: Lives with: lives with their spouse Lives in: House/apartment Stairs: Yes: Internal: 12 steps; rail/wall and External: 1 to get in the house from garage, 3 steps to patio steps; one rail  on front, double to patio  Has following equipment at home: unable to assess on eval  OCCUPATION: retired, IT trainer  PLOF: Independent and Rt TTA prosthesis   PATIENT GOALS: get rid of the pain to lift and move things around  NEXT MD VISIT: 10/26/2023 for injection in Lt hip   OBJECTIVE:  Note: Objective measures were completed at Evaluation unless otherwise noted.  DIAGNOSTIC FINDINGS:  IMPRESSION: 1. Mild spurring of the left acetabulum and femoral head. 2. Mild proximal hamstring tendinopathy. 3. Trace nonspecific edema medially along the hip adductor musculature. 4. Contralateral (right) total hip prosthesis.  PATIENT SURVEYS:  PSFS: THE PATIENT SPECIFIC FUNCTIONAL SCALE  Place score of 0-10 (0 = unable to perform activity and 10 = able to perform activity at the same level as before injury or problem)  Activity Date: 10/25/2023    Raising right arm above shoulder   5    2. Carrying load with right arm  4    3. Sleeping on right side  4    4.      Total Score 4.33      Total Score = Sum of activity scores/number of activities  Minimally Detectable Change: 3 points (for single activity); 2 points (for average score)  [facility] (nd). The Patient Specific Functional Scale . Retrieved from [email or web address]   COGNITION: Overall cognitive status: Within functional limits for tasks assessed     SENSATION: Not formally assessed on eval  EDEMA:    MUSCLE LENGTH: Not formally assessed on eval secondary to focus on UE  POSTURE: rounded shoulders, forward head, and increased thoracic kyphosis  PALPATION: Not formally assessed on eval  LOWER EXTREMITY ROM: not assessed on eval secondary to Rt UE focus   ROM Right Eval 10/25/2023 Left Eval 10/25/2023  Hip flexion    Hip extension    Hip abduction    Hip adduction    Hip internal rotation    Hip external rotation    Knee flexion    Knee extension    Ankle dorsiflexion    Ankle plantarflexion    Ankle inversion    Ankle eversion     (Blank rows = not tested)  LOWER EXTREMITY MMT: not assessed on eval secondary to Rt UE focus   MMT Right eval Left eval  Hip flexion    Hip extension    Hip abduction    Hip adduction    Hip internal rotation    Hip external rotation    Knee flexion    Knee extension    Ankle dorsiflexion    Ankle plantarflexion    Ankle inversion    Ankle eversion     (Blank rows = not tested)  LOWER EXTREMITY SPECIAL TESTS:  Not formally assessed on eval   Upper Extremity ROM: 10/25/2023 SEATED , AROM Shoulder flexion: Rt: 80deg Lt: WFL Shoulder abduction: Rt: 72deg Lt: WFL Shoulder ER: Rt: L3 Lt: T3 Shoulder IR: Rt: T1 Lt: T10  Upper Extremity MMT:  SEATED ; Rt within available ROM  Shoulder flexion: Rt: 4+/5 Lt: 4+/5 Shoulder abduction: Rt:4+/5 Lt: 4+/5 Shoulder ER: Rt: 3+/5  Lt: 3+/5 Shoulder IR: Rt:3+/5  Lt: 4-/5  UE special tests: 10/25/2023 Full can: negative Empty can: postivie, tension and painful   FUNCTIONAL TESTS:  Not formally assessed on eval  GAIT: Distance walked: not formally assessed  Assistive device utilized: Rt TTA prosthesis  Level of assistance: supervision Comments: Rt TTA prosthesis appears to be short  TREATMENT DATE:  10/29/2023 TherEx: Pulleys , 3 min shoulder flexion, 3 min shoulder scaption  Doorway stretch with neutral hand for bicep 3x30s  PT discussed adding this to HEP  Attempted supine AAROM with PVC noting pain in bicep region ~70deg ; discontinued   Neuro Re-Ed:  Standing rows with blue TB 2x12 with 3s hold  Standing straight arm pulldowns with green TB 2x12 with 2s hold  Discussed relationship of posture with shoulder pain/bicep pain  Manual:  Grade 2 posterior and inferior glides, then performed abduction PROM to ~100deg with no pain noted in shoulder  STM and palpation to bicep, UT, supraspinatus  Special test for bicep, positive    10/25/2023 TherEx:  HEP handout provided with patient performing one set of each activity for appropriate form. Verbal and tactile cues required.   Self-Care: POC and addition of hip to POC if deemed necessary    PATIENT EDUCATION:  Education details: HEP, POC Person educated: Patient Education method: Explanation, Demonstration, Tactile cues, Verbal cues, and Handouts Education comprehension: verbalized understanding and returned demonstration  HOME EXERCISE PROGRAM: Access Code: [email or web address] URL: [email or web address] Date: 10/25/2023 Prepared by: Susannah Daring  Exercises - Seated Cervical Retraction  - 1 x daily - 7 x weekly - 2 sets - 10 reps - 3s hold - Seated Scapular Retraction  - 1 x daily - 7 x weekly - 2 sets - 10 reps - 3s  hold - Supine Shoulder Flexion with Dowel  - 1 x daily - 7 x weekly - 2 sets - 10 reps - 3s hold - Supine Shoulder Abduction AAROM with Dowel  - 1 x daily - 7 x weekly - 2 sets - 10 reps - 3s hold  ASSESSMENT:  CLINICAL IMPRESSION: Patient arrived to session noting difficulty with abduction secondary to bicep pain. Patient tolerated all activities this date and presents with biceps tendinitis vs rotator cuff tendinopathy. PT will assess further at next session. Patient will continue to benefit from skilled PT.  OBJECTIVE IMPAIRMENTS: Abnormal gait, decreased activity tolerance, decreased balance, decreased coordination, decreased mobility, difficulty walking, decreased ROM, decreased strength, impaired UE functional use, improper body mechanics, postural dysfunction, prosthetic dependency , and pain.   ACTIVITY LIMITATIONS: carrying, lifting, sleeping, bed mobility, and reach over head  PARTICIPATION LIMITATIONS: cleaning, community activity, and church  PERSONAL FACTORS: Past/current experiences and 3+ comorbidities: hypothyroidism, HTN, HLD, GERD, A-fib (s/p ablation), DM2, arthritis, PVD, CAD are also affecting patient's functional outcome.   REHAB POTENTIAL: Good  CLINICAL DECISION MAKING: Evolving/moderate complexity  EVALUATION COMPLEXITY: Moderate   GOALS: Goals reviewed with patient? Yes  SHORT TERM GOALS: Target date: 11/08/2023 Patient will show compliance with initial HEP. Baseline: Goal status: INITIAL  2.  Patient will report pain levels no greater than 6/10 in order to show improved overall quality of life. Baseline:  Goal status: INITIAL    LONG TERM GOALS: Target date: 11/22/2023  Patient will be independent with final HEP in order to maintain and progress upon functional gains made within PT. Baseline:  Goal status: INITIAL  2.  Patient will report pain levels no greater than 3/10 in order to show improved overall quality of life. Baseline:  Goal status:  INITIAL  3.  Patient will increase PSFS to at least 6.33 in order to show a significant improvement in subjective disability rating. Baseline:  Goal status: INITIAL  4.  Patient will increase Rt shoulder flexion ROM to at least 110deg in order to improve functional mobility. Baseline:  Goal status:  INITIAL  5.  Patient will increase Rt shoulder abduction ROM to at least 95deg in order to improve functional mobility. Baseline:  Goal status: INITIAL     PLAN:  PT FREQUENCY: 1-2x/week  PT DURATION: 4 weeks  PLANNED INTERVENTIONS: [id number]- PT Re-evaluation, 97750- Physical Performance Testing, 97110-Therapeutic exercises, 97530- Therapeutic activity, [id number]- Neuromuscular re-education, 97535- Self Care, [id number]- Manual therapy, [id number]- Gait training, [id number]- Prosthetic Initial , [id number]- Orthotic/Prosthetic subsequent, [phone]- Canalith repositioning, [id number]- Aquatic Therapy, [id number]- Electrical stimulation (unattended), [phone]- Electrical stimulation (manual), [id number]- Vasopneumatic device, [id number]- Ultrasound, [id number]- Traction (mechanical), [id number]- Ionotophoresis 4mg /ml Dexamethasone , 97597- Wound care (first 20 sq cm), 97598- Wound care (each additional 20 sq cm), 20560 (1-2 muscles), 20561 (3+ muscles)- Dry Needling, Patient/Family education, Balance training, Stair training, Taping, Joint mobilization, Joint manipulation, Spinal manipulation, Spinal mobilization, Scar mobilization, Compression bandaging, Vestibular training, DME instructions, Cryotherapy, and Moist heat  PLAN FOR NEXT SESSION:  further bicep assessment, assess Lt hip if necessary, generalized mobility and strengthening for Rt shoulder, postural strengthening   Susannah Daring, PT, DPT 10/29/23 12:52 PM

## 2023-10-30 ENCOUNTER — Encounter: Payer: Self-pay | Admitting: Orthopedic Surgery

## 2023-10-30 ENCOUNTER — Encounter: Payer: Self-pay | Admitting: Orthopaedic Surgery

## 2023-10-31 ENCOUNTER — Other Ambulatory Visit: Payer: Self-pay

## 2023-10-31 DIAGNOSIS — G8929 Other chronic pain: Secondary | ICD-10-CM

## 2023-11-01 NOTE — Therapy (Signed)
 OUTPATIENT PHYSICAL THERAPY UPPER EXTREMITY TREATMENT   Patient Name: [name] MRN: [id number] DOB:[birth year], 73 y.o., male Today's Date: 11/01/2023  END OF SESSION:    Past Medical History:  Diagnosis Date   Anemia    low iron   Arthritis    Coronary artery disease 2006   a. remote stenting to ramus, prox LAD x2.   Dental crowns present    Diabetes mellitus    First degree AV block    GERD (gastroesophageal reflux disease)    Heart attack (HCC) 06/2004   Heart murmur    aortic   Hx MRSA infection 02/2006   Hyperlipidemia    Hypertension    hx. of - has not been on med. since losing wt. after gastric bypass   Hypothyroidism    Morbid obesity (HCC)    Mucoid cyst of joint 09/2011   left thumb   Sleep apnea    sleep study 03/29/2011; no CPAP use, lost >130lbs   Trifascicular block    Past Surgical History:  Procedure Laterality Date   AMPUTATION Left 04/19/2016   Procedure: Left Foot 4th Toe Amputation vs. Transmetatarsal;  Surgeon: Jerona Harden GAILS, MD;  Location: [facility] OR;  Service: Orthopedics;  Laterality: Left;   ATRIAL FIBRILLATION ABLATION N/A 10/11/2023   Procedure: ATRIAL FIBRILLATION ABLATION;  Surgeon: Nancey Eulas BRAVO, MD;  Location: [facility] INVASIVE CV LAB;  Service: Cardiovascular;  Laterality: N/A;   BIOPSY  04/07/2019   Procedure: BIOPSY;  Surgeon: Elicia Claw, MD;  Location: [facility];  Service: Gastroenterology;;   CATARACT EXTRACTION  02/2010; 03/2010   COLONOSCOPY WITH PROPOFOL  N/A 09/14/2014   Procedure: COLONOSCOPY WITH PROPOFOL ;  Surgeon: Gladis MARLA Louder, MD;  Location: [facility];  Service: Endoscopy;  Laterality: N/A;   COLONOSCOPY WITH PROPOFOL  N/A 04/07/2019   Procedure: COLONOSCOPY WITH PROPOFOL ;  Surgeon: Elicia Claw, MD;  Location: [facility];  Service: Gastroenterology;  Laterality: N/A;   CORONARY ANGIOPLASTY WITH STENT PLACEMENT  07/12/2004; 08/03/2004   total of 3 stents   CORONARY STENT INTERVENTION N/A 08/23/2022   Procedure:  CORONARY STENT INTERVENTION;  Surgeon: Swaziland, Peter M, MD;  Location: [facility] INVASIVE CV LAB;  Service: Cardiovascular;  Laterality: N/A;   I & D EXTREMITY Right 06/04/2014   Procedure: IRRIGATION AND DEBRIDEMENT EXTREMITY;  Surgeon: Lonni CINDERELLA Poli, MD;  Location: [facility] OR;  Service: Orthopedics;  Laterality: Right;   LEFT HEART CATH AND CORONARY ANGIOGRAPHY N/A 08/23/2022   Procedure: LEFT HEART CATH AND CORONARY ANGIOGRAPHY;  Surgeon: Swaziland, Peter M, MD;  Location: [facility] INVASIVE CV LAB;  Service: Cardiovascular;  Laterality: N/A;   MASS EXCISION  10/04/2011   Procedure: EXCISION MASS;  Surgeon: Arley JONELLE Curia, MD;  Location: [facility];  Service: Orthopedics;  Laterality: Left;  excision cyst debridment ip joint of left thumb   PROSTATECTOMY     right below knee amputation  2018   ROUX-EN-Y GASTRIC BYPASS  10/10/2010   laparoscopic   SHOULDER ARTHROSCOPY WITH ROTATOR CUFF REPAIR AND SUBACROMIAL DECOMPRESSION Right 03/23/2020   Procedure: RIGHT SHOULDER ARTHROSCOPY WITH DEBRIDEMENT AND  ROTATOR CUFF REPAIR;  Surgeon: Poli Lonni CINDERELLA, MD;  Location: [facility] OR;  Service: Orthopedics;  Laterality: Right;   TOE AMPUTATION  02/23/2006   left foot second ray amputation   TOTAL HIP ARTHROPLASTY Right 03/29/2020   Procedure: TOTAL HIP ARTHROPLASTY POSTERIOR APPROACH;  Surgeon: Jerri Kay HERO, MD;  Location: [facility] OR;  Service: Orthopedics;  Laterality: Right;   TRANSMETATARSAL AMPUTATION Left  TRIGGER FINGER RELEASE Right 09/16/2019   Procedure: RELEASE TRIGGER FINGER/A-1 PULLEY;  Surgeon: Murrell Kuba, MD;  Location: [facility];  Service: Orthopedics;  Laterality: Right;  IV REGIONAL FOREARM BLOCK   Patient Active Problem List   Diagnosis Date Noted   Atrial flutter (HCC) 06/27/2023   Paroxysmal atrial fibrillation with RVR (HCC) 08/23/2022   Ascending aortic aneurysm 08/23/2022   Bradycardia 08/22/2022   NSTEMI (non-ST elevated myocardial infarction) (HCC) 08/22/2022    Atrial fibrillation with RVR (HCC) 08/22/2022   Hypotension 01/07/2021   S/P arthroscopy of right shoulder 06/02/2020   Closed fracture of neck of right femur (HCC)    Hip fracture (HCC) 03/27/2020   Nontraumatic complete tear of right rotator cuff 02/26/2020   S/P transmetatarsal amputation of foot, left (HCC) 04/25/2016   Chronic osteomyelitis of toe, left (HCC)    Chronic diastolic CHF (congestive heart failure) (HCC) 09/25/2015   Aortic stenosis 08/04/2014   Diabetic foot infection (HCC) 06/03/2014   Peripheral vascular disease 06/03/2014   Coronary artery disease involving native coronary artery of native heart without angina pectoris 07/29/2013   Trifascicular block 07/29/2013   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 07/26/2011   Hypothyroidism 01/22/2007   Type 2 diabetes mellitus with vascular disease (HCC) 01/22/2007   HYPERLIPIDEMIA, MIXED 01/22/2007   MYOCARDIAL INFARCTION, HX OF 01/22/2007   ANEMIA, IRON DEFICIENCY, HX OF 01/22/2007    PCP: Trula SHAUNNA Brim, MD   REFERRING PROVIDER: Cordella Glendia Hutchinson, MD   REFERRING DIAG: [phone] (ICD-10-CM) - Chronic right shoulder pain   THERAPY DIAG:  No diagnosis found.  Rationale for Evaluation and Treatment: Rehabilitation  ONSET DATE: spring 2025  SUBJECTIVE:   SUBJECTIVE STATEMENT: ***  Patient noting difficulty and pain with shoulder abduction HEP activity.  PERTINENT HISTORY: Patient has undergone Rt TTA, Rt shoulder arthroscopy, and Rt THA. Currently having Rt shoulder pain after 1 fall in the spring where he landed on Rt shoulder. Patient is now experiencing pain in Rt shoulder with lifting and sleeping. Patient also reports Lt hip pain that is currently being treated with injection, tramadol , and prednisone.    See PMH or personal factors for comorbidities  PAIN:  Are you having pain? Yes: NPRS scale: 0/10 this session Pain location: Rt shoulder, bicep with reaching  Pain description: sharp Aggravating  factors: sleeping, lifting, reaching Relieving factors: rest, injection  PRECAUTIONS: Fall and Other: cardiac  RED FLAGS: None   WEIGHT BEARING RESTRICTIONS: No  FALLS:  Has patient fallen in last 6 months? Yes. Number of falls 1; thought he was locked into the prosthesis and was not    LIVING ENVIRONMENT: Lives with: lives with their spouse Lives in: House/apartment Stairs: Yes: Internal: 12 steps; rail/wall and External: 1 to get in the house from garage, 3 steps to patio steps; one rail on front, double to patio  Has following equipment at home: unable to assess on eval  OCCUPATION: retired, IT trainer  PLOF: Independent and Rt TTA prosthesis   PATIENT GOALS: get rid of the pain to lift and move things around  NEXT MD VISIT: 10/26/2023 for injection in Lt hip   OBJECTIVE:  Note: Objective measures were completed at Evaluation unless otherwise noted.  DIAGNOSTIC FINDINGS:  IMPRESSION: 1. Mild spurring of the left acetabulum and femoral head. 2. Mild proximal hamstring tendinopathy. 3. Trace nonspecific edema medially along the hip adductor musculature. 4. Contralateral (right) total hip prosthesis.  PATIENT SURVEYS:  PSFS: THE PATIENT SPECIFIC FUNCTIONAL SCALE  Place score of 0-10 (0 = unable to perform activity and 10 = able to perform activity at the same level as before injury or problem)  Activity Date: 10/25/2023    Raising right arm above shoulder  5    2. Carrying load with right arm  4    3. Sleeping on right side  4    4.      Total Score 4.33      Total Score = Sum of activity scores/number of activities  Minimally Detectable Change: 3 points (for single activity); 2 points (for average score)  [facility] (nd). The Patient Specific Functional Scale . Retrieved from [email or web address]   COGNITION: Overall cognitive status: Within functional limits for tasks assessed     SENSATION: Not  formally assessed on eval  EDEMA:    MUSCLE LENGTH: Not formally assessed on eval secondary to focus on UE  POSTURE: rounded shoulders, forward head, and increased thoracic kyphosis  PALPATION: Not formally assessed on eval  LOWER EXTREMITY ROM: not assessed on eval secondary to Rt UE focus   ROM Right Eval 10/25/2023 Left Eval 10/25/2023  Hip flexion    Hip extension    Hip abduction    Hip adduction    Hip internal rotation    Hip external rotation    Knee flexion    Knee extension    Ankle dorsiflexion    Ankle plantarflexion    Ankle inversion    Ankle eversion     (Blank rows = not tested)  LOWER EXTREMITY MMT: not assessed on eval secondary to Rt UE focus   MMT Right eval Left eval  Hip flexion    Hip extension    Hip abduction    Hip adduction    Hip internal rotation    Hip external rotation    Knee flexion    Knee extension    Ankle dorsiflexion    Ankle plantarflexion    Ankle inversion    Ankle eversion     (Blank rows = not tested)  LOWER EXTREMITY SPECIAL TESTS:  Not formally assessed on eval   Upper Extremity ROM: 10/25/2023 SEATED , AROM Shoulder flexion: Rt: 80deg Lt: WFL Shoulder abduction: Rt: 72deg Lt: WFL Shoulder ER: Rt: L3 Lt: T3 Shoulder IR: Rt: T1 Lt: T10  Upper Extremity MMT:  SEATED ; Rt within available ROM  Shoulder flexion: Rt: 4+/5 Lt: 4+/5 Shoulder abduction: Rt:4+/5 Lt: 4+/5 Shoulder ER: Rt: 3+/5 Lt: 3+/5 Shoulder IR: Rt:3+/5  Lt: 4-/5  UE special tests: 10/25/2023 Full can: negative Empty can: postivie, tension and painful   FUNCTIONAL TESTS:  Not formally assessed on eval  GAIT: Distance walked: not formally assessed  Assistive device utilized: Rt TTA prosthesis  Level of assistance: supervision Comments: Rt TTA prosthesis appears to be short  TREATMENT DATE:   11/02/2023 ***  10/29/2023 TherEx: Pulleys , 3 min shoulder flexion, 3 min shoulder scaption  Doorway stretch with neutral hand for bicep 3x30s  PT discussed adding this to HEP  Attempted supine AAROM with PVC noting pain in bicep region ~70deg ; discontinued   Neuro Re-Ed:  Standing rows with blue TB 2x12 with 3s hold  Standing straight arm pulldowns with green TB 2x12 with 2s hold  Discussed relationship of posture with shoulder pain/bicep pain  Manual:  Grade 2 posterior and inferior glides, then performed abduction PROM to ~100deg with no pain noted in shoulder  STM and palpation to bicep, UT, supraspinatus  Special test for bicep, positive    10/25/2023 TherEx:  HEP handout provided with patient performing one set of each activity for appropriate form. Verbal and tactile cues required.   Self-Care: POC and addition of hip to POC if deemed necessary    PATIENT EDUCATION:  Education details: HEP, POC Person educated: Patient Education method: Explanation, Demonstration, Tactile cues, Verbal cues, and Handouts Education comprehension: verbalized understanding and returned demonstration  HOME EXERCISE PROGRAM: Access Code: [email or web address] URL: [email or web address] Date: 10/25/2023 Prepared by: Susannah Daring  Exercises - Seated Cervical Retraction  - 1 x daily - 7 x weekly - 2 sets - 10 reps - 3s hold - Seated Scapular Retraction  - 1 x daily - 7 x weekly - 2 sets - 10 reps - 3s hold - Supine Shoulder Flexion with Dowel  - 1 x daily - 7 x weekly - 2 sets - 10 reps - 3s hold - Supine Shoulder Abduction AAROM with Dowel  - 1 x daily - 7 x weekly - 2 sets - 10 reps - 3s hold  ASSESSMENT:  CLINICAL IMPRESSION: *** Patient arrived to session noting difficulty with abduction secondary to bicep pain. Patient tolerated all activities this date and presents with biceps tendinitis vs rotator cuff tendinopathy. PT will assess further at next session. Patient will  continue to benefit from skilled PT.  OBJECTIVE IMPAIRMENTS: Abnormal gait, decreased activity tolerance, decreased balance, decreased coordination, decreased mobility, difficulty walking, decreased ROM, decreased strength, impaired UE functional use, improper body mechanics, postural dysfunction, prosthetic dependency , and pain.   ACTIVITY LIMITATIONS: carrying, lifting, sleeping, bed mobility, and reach over head  PARTICIPATION LIMITATIONS: cleaning, community activity, and church  PERSONAL FACTORS: Past/current experiences and 3+ comorbidities: hypothyroidism, HTN, HLD, GERD, A-fib (s/p ablation), DM2, arthritis, PVD, CAD are also affecting patient's functional outcome.   REHAB POTENTIAL: Good  CLINICAL DECISION MAKING: Evolving/moderate complexity  EVALUATION COMPLEXITY: Moderate   GOALS: Goals reviewed with patient? Yes  SHORT TERM GOALS: Target date: 11/08/2023 Patient will show compliance with initial HEP. Baseline: Goal status: INITIAL  2.  Patient will report pain levels no greater than 6/10 in order to show improved overall quality of life. Baseline:  Goal status: INITIAL    LONG TERM GOALS: Target date: 11/22/2023  Patient will be independent with final HEP in order to maintain and progress upon functional gains made within PT. Baseline:  Goal status: INITIAL  2.  Patient will report pain levels no greater than 3/10 in order to show improved overall quality of life. Baseline:  Goal status: INITIAL  3.  Patient will increase PSFS to at least 6.33 in order to show a significant improvement in subjective disability rating. Baseline:  Goal status: INITIAL  4.  Patient will increase Rt shoulder flexion ROM to at least 110deg in order to improve functional mobility.  Baseline:  Goal status: INITIAL  5.  Patient will increase Rt shoulder abduction ROM to at least 95deg in order to improve functional mobility. Baseline:  Goal status: INITIAL     PLAN:  PT  FREQUENCY: 1-2x/week  PT DURATION: 4 weeks  PLANNED INTERVENTIONS: [id number]- PT Re-evaluation, 97750- Physical Performance Testing, 97110-Therapeutic exercises, 97530- Therapeutic activity, [id number]- Neuromuscular re-education, 97535- Self Care, [id number]- Manual therapy, [id number]- Gait training, [id number]- Prosthetic Initial , [id number]- Orthotic/Prosthetic subsequent, [phone]- Canalith repositioning, [id number]- Aquatic Therapy, H9716- Electrical stimulation (unattended), [phone]- Electrical stimulation (manual), [id number]- Vasopneumatic device, [id number]- Ultrasound, [id number]- Traction (mechanical), [id number]- Ionotophoresis 4mg /ml Dexamethasone , 97597- Wound care (first 20 sq cm), 97598- Wound care (each additional 20 sq cm), 20560 (1-2 muscles), 20561 (3+ muscles)- Dry Needling, Patient/Family education, Balance training, Stair training, Taping, Joint mobilization, Joint manipulation, Spinal manipulation, Spinal mobilization, Scar mobilization, Compression bandaging, Vestibular training, DME instructions, Cryotherapy, and Moist heat  PLAN FOR NEXT SESSION:  *** further bicep assessment, assess Lt hip if necessary, generalized mobility and strengthening for Rt shoulder, postural strengthening   Susannah Daring, PT, DPT 11/01/23 7:47 AM

## 2023-11-02 ENCOUNTER — Ambulatory Visit (INDEPENDENT_AMBULATORY_CARE_PROVIDER_SITE_OTHER): Payer: Self-pay

## 2023-11-02 DIAGNOSIS — M25611 Stiffness of right shoulder, not elsewhere classified: Secondary | ICD-10-CM | POA: Diagnosis not present

## 2023-11-02 DIAGNOSIS — M25511 Pain in right shoulder: Secondary | ICD-10-CM | POA: Diagnosis not present

## 2023-11-02 DIAGNOSIS — M25552 Pain in left hip: Secondary | ICD-10-CM | POA: Diagnosis not present

## 2023-11-02 DIAGNOSIS — M6281 Muscle weakness (generalized): Secondary | ICD-10-CM | POA: Diagnosis not present

## 2023-11-02 DIAGNOSIS — G8929 Other chronic pain: Secondary | ICD-10-CM | POA: Diagnosis not present

## 2023-11-02 DIAGNOSIS — R2689 Other abnormalities of gait and mobility: Secondary | ICD-10-CM | POA: Diagnosis not present

## 2023-11-03 DIAGNOSIS — Z23 Encounter for immunization: Secondary | ICD-10-CM | POA: Diagnosis not present

## 2023-11-05 ENCOUNTER — Other Ambulatory Visit: Payer: Self-pay | Admitting: Radiology

## 2023-11-05 ENCOUNTER — Telehealth: Payer: Self-pay | Admitting: Orthopedic Surgery

## 2023-11-05 DIAGNOSIS — G8929 Other chronic pain: Secondary | ICD-10-CM

## 2023-11-05 NOTE — Telephone Encounter (Signed)
 Pt request a sooner appointment than 11/29/23 was a Urgent referral from [facility] for MRI review.

## 2023-11-08 ENCOUNTER — Ambulatory Visit
Admission: RE | Admit: 2023-11-08 | Discharge: 2023-11-08 | Disposition: A | Discharge status: Home / Self Care | Source: Ambulatory Visit | Attending: Orthopaedic Surgery | Admitting: Orthopaedic Surgery

## 2023-11-08 ENCOUNTER — Ambulatory Visit (HOSPITAL_COMMUNITY)
Admission: RE | Admit: 2023-11-08 | Discharge: 2023-11-08 | Disposition: A | Discharge status: Home / Self Care | Source: Ambulatory Visit | Attending: Physician Assistant | Admitting: Physician Assistant

## 2023-11-08 VITALS — BP 136/70 | HR 57 | Ht >= 80 in | Wt 216.6 lb

## 2023-11-08 DIAGNOSIS — M48061 Spinal stenosis, lumbar region without neurogenic claudication: Secondary | ICD-10-CM | POA: Diagnosis not present

## 2023-11-08 DIAGNOSIS — I4891 Unspecified atrial fibrillation: Secondary | ICD-10-CM | POA: Insufficient documentation

## 2023-11-08 DIAGNOSIS — M47817 Spondylosis without myelopathy or radiculopathy, lumbosacral region: Secondary | ICD-10-CM | POA: Diagnosis not present

## 2023-11-08 DIAGNOSIS — D6869 Other thrombophilia: Secondary | ICD-10-CM | POA: Diagnosis present

## 2023-11-08 DIAGNOSIS — I483 Typical atrial flutter: Secondary | ICD-10-CM | POA: Insufficient documentation

## 2023-11-08 DIAGNOSIS — I4819 Other persistent atrial fibrillation: Secondary | ICD-10-CM | POA: Diagnosis present

## 2023-11-08 DIAGNOSIS — G8929 Other chronic pain: Secondary | ICD-10-CM

## 2023-11-08 DIAGNOSIS — M5127 Other intervertebral disc displacement, lumbosacral region: Secondary | ICD-10-CM | POA: Diagnosis not present

## 2023-11-08 NOTE — Telephone Encounter (Signed)
Added to list for sooner appt

## 2023-11-08 NOTE — Progress Notes (Signed)
 Primary Care Physician: Dwight Trula SQUIBB, MD Primary Cardiologist: Oneil Parchment, MD Electrophysiologist: Eulas FORBES Furbish, MD  Referring Physician: Dr Furbish   [name] is a 73 y.o. male with a history of CAD, AS, HTN, obesity s/p gastric bypass 2012, HLD, hypothyroidism, OSA, DM, right BKA, atrial fibrillation who presents for follow up in the [facility]. He was sent to the ER on June 27, 2023, with atrial flutter and underwent DC cardioversion. He has been hospitalized or in the ER multiple times due to abnormal heart rhythms. He was seen by Dr Furbish and underwent afib and flutter ablation on 10/11/23. Patient is on Eliquis  for stroke prevention.    Patient presents today for follow up for atrial fibrillation and atrial flutter. He remains in SR today and feels well. No interim symptoms of afib. He denies chest pain or groin issues. No bleeding issues on anticoagulation.   Today, he denies symptoms of palpitations, chest pain, shortness of breath, orthopnea, PND, lower extremity edema, dizziness, presyncope, syncope, bleeding, or neurologic sequela. The patient is tolerating medications without difficulties and is otherwise without complaint today.    Atrial Fibrillation Risk Factors:  he does have symptoms or diagnosis of sleep apnea. he does not have a history of rheumatic fever.   Atrial Fibrillation Management history:  Previous antiarrhythmic drugs: none Previous cardioversions: 06/27/23 Previous ablations: 10/11/23 Anticoagulation history: Eliquis   ROS- All systems are reviewed and negative except as per the HPI above.  Past Medical History:  Diagnosis Date   Anemia    low iron   Arthritis    Coronary artery disease 2006   a. remote stenting to ramus, prox LAD x2.   Dental crowns present    Diabetes mellitus    First degree AV block    GERD (gastroesophageal reflux disease)    Heart attack (HCC) 06/2004   Heart murmur    aortic   Hx MRSA  infection 02/2006   Hyperlipidemia    Hypertension    hx. of - has not been on med. since losing wt. after gastric bypass   Hypothyroidism    Morbid obesity (HCC)    Mucoid cyst of joint 09/2011   left thumb   Sleep apnea    sleep study 03/29/2011; no CPAP use, lost >130lbs   Trifascicular block     Current Outpatient Medications  Medication Sig Dispense Refill   apixaban  (ELIQUIS ) 5 MG TABS tablet Take 1 tablet (5 mg total) by mouth 2 (two) times daily. 180 tablet 1   atorvastatin  (LIPITOR) 40 MG tablet Take 1 tablet (40 mg total) by mouth at bedtime. 30 tablet 2   Calcium  Citrate-Vitamin D  (CALCIUM  CITRATE CHEWY BITE PO) Take 500 mg by mouth in the morning and at bedtime. In the afternoon & after supper (Patient taking differently: Take 500 mg by mouth 3 (three) times daily.)     clopidogrel  (PLAVIX ) 75 MG tablet Take 1 tablet (75 mg total) by mouth daily with breakfast. 90 tablet 2   CRANBERRY PO Take 1 tablet by mouth in the morning.     Cyanocobalamin  (VITAMIN B-12 PO) Take 1 tablet by mouth every Monday, Wednesday, and Friday.     famotidine  (PEPCID ) 40 MG tablet Take 40 mg by mouth at bedtime.     Ferrous Sulfate  (IRON PO) Take 1 tablet by mouth every Monday, Wednesday, and Friday.     fluticasone  (FLONASE ) 50 MCG/ACT nasal spray Place 1-2 sprays into both nostrils daily  as needed for allergies.     gabapentin  (NEURONTIN ) 100 MG capsule TAKE 1 CAPSULE BY MOUTH 4 TIMES A DAY WHEN NECESSARY FOR NEUROPATHY PAIN 360 capsule 2   hypromellose (SYSTANE NIGHT) 0.3 % GEL ophthalmic ointment Place 1 Application into both eyes at bedtime as needed for dry eyes.     ipratropium (ATROVENT) 0.03 % nasal spray Place 2 sprays into both nostrils 2 (two) times daily as needed (allergies.).     levothyroxine  (SYNTHROID ) 125 MCG tablet Take 1 tablet (125 mcg total) by mouth daily at 6 (six) AM. 90 tablet 3   losartan  (COZAAR ) 25 MG tablet Take 1 tablet (25 mg total) by mouth daily. 90 tablet 3    MAGNESIUM -POTASSIUM PO Take 1 tablet by mouth in the morning.     metFORMIN  (GLUCOPHAGE -XR) 500 MG 24 hr tablet Take 1,000 mg by mouth 2 (two) times daily.     Multiple Vitamin (MULTIVITAMIN WITH MINERALS) TABS tablet Take 1 tablet by mouth in the morning and at bedtime. Gummies     Multiple Vitamins-Minerals (PRESERVISION AREDS 2) CAPS Take 1 capsule by mouth in the morning and at bedtime.     nitroGLYCERIN  (NITROSTAT ) 0.4 MG SL tablet Place 1 tablet (0.4 mg total) under the tongue every 5 (five) minutes x 3 doses as needed for chest pain. 25 tablet 3   Omega 3 1000 MG CAPS Take 1,000 mg by mouth in the morning.     Polyethyl Glycol-Propyl Glycol (SYSTANE) 0.4-0.3 % SOLN Place 1-2 drops into both eyes 3 (three) times daily as needed (dry/irritated eyes.).     Polyethylene Glycol 400 (BLINK TEARS OP) Take 1 capsule by mouth in the morning. Blink NutriTears Soft gel     Probiotic Product (PROBIOTIC PO) Take 1 capsule by mouth daily in the afternoon.     Valerian Root 450 MG CAPS Take 450 mg by mouth at bedtime.     No current facility-administered medications for this encounter.    Physical Exam: BP 136/70   Pulse (!) 57   Ht 6' 8 (2.032 m)   Wt 98.2 kg   BMI 23.79 kg/m   GEN: Well nourished, well developed in no acute distress CARDIAC: Regular rate and rhythm, no murmurs, rubs, gallops RESPIRATORY:  Clear to auscultation without rales, wheezing or rhonchi  ABDOMEN: Soft, non-tender, non-distended EXTREMITIES:  No edema; No deformity   Wt Readings from Last 3 Encounters:  11/08/23 98.2 kg  10/11/23 102.1 kg  08/20/23 103 kg     EKG today demonstrates  SB, 1st degree AV block, RBBB, LAFB Vent. rate 57 BPM PR interval 272 ms QRS duration 140 ms QT/QTcB 462/449 ms   Echo 08/23/22 demonstrated   1. Left ventricular ejection fraction, by estimation, is 60 to 65%. The  left ventricle has normal function. The left ventricle has no regional  wall motion abnormalities. Left  ventricular diastolic parameters are  consistent with Grade I diastolic dysfunction (impaired relaxation).   2. Right ventricular systolic function is normal. The right ventricular  size is mildly enlarged.   3. The mitral valve is normal in structure. No evidence of mitral valve  regurgitation. No evidence of mitral stenosis.   4. The aortic valve is calcified. There is mild calcification of the  aortic valve. There is mild thickening of the aortic valve. Aortic valve  regurgitation is trivial. Aortic valve sclerosis is present, with no  evidence of aortic valve stenosis.   5. Aortic dilatation noted. There is mild dilatation  of the aortic root,  measuring 41 mm. There is mild dilatation of the ascending aorta,  measuring 44 mm.   6. The inferior vena cava is normal in size with greater than 50%  respiratory variability, suggesting right atrial pressure of 3 mmHg.    CHA2DS2-VASc Score = 5  The patient's score is based upon: CHF History: 0 HTN History: 1 Diabetes History: 1 Stroke History: 0 Vascular Disease History: 1 Age Score: 2 Gender Score: 0       ASSESSMENT AND PLAN: Persistent Atrial Fibrillation/atrial flutter (ICD10:  I48.19) The patient's CHA2DS2-VASc score is 5, indicating a 7.2% annual risk of stroke.   S/p afib and flutter ablation 10/11/23 Patient appears to be maintaining SR Continue Eliquis  5 mg BID with no missed doses for 3 months post ablation.   Secondary Hypercoagulable State (ICD10:  D68.69) The patient is at significant risk for stroke/thromboembolism based upon his CHA2DS2-VASc Score of 5.  Continue Apixaban  (Eliquis ). No bleeding issues.   CAD PCI to LAD No anginal symptoms Followed by Dr Jeffrie  HTN Stable on current regimen  OSA  Encouraged nightly CPAP  Tachybradycardia syndrome Avoiding AV nodal agents.    Follow up with Dr Nancey as scheduled.     [facility] [address] [place], [place]  [address] [phone]

## 2023-11-08 NOTE — Therapy (Signed)
 OUTPATIENT PHYSICAL THERAPY UPPER EXTREMITY TREATMENT   Patient Name: [name] MRN: [id number] DOB:09/15/1950, 73 y.o., male Today's Date: 11/09/2023  END OF SESSION:  PT End of Session - 11/09/23 0931     Visit Number 4    Number of Visits 8    Date for Recertification  11/22/23    Authorization Type MEDICARE AND BCBS    Progress Note Due on Visit 10    PT Start Time 0931    PT Stop Time 1014    PT Time Calculation (min) 43 min    Activity Tolerance Patient tolerated treatment well    Behavior During Therapy [facility] for tasks assessed/performed            Past Medical History:  Diagnosis Date   Anemia    low iron   Arthritis    Coronary artery disease 2006   a. remote stenting to ramus, prox LAD x2.   Dental crowns present    Diabetes mellitus    First degree AV block    GERD (gastroesophageal reflux disease)    Heart attack (HCC) 06/2004   Heart murmur    aortic   Hx MRSA infection 02/2006   Hyperlipidemia    Hypertension    hx. of - has not been on med. since losing wt. after gastric bypass   Hypothyroidism    Morbid obesity (HCC)    Mucoid cyst of joint 09/2011   left thumb   Sleep apnea    sleep study 03/29/2011; no CPAP use, lost >130lbs   Trifascicular block    Past Surgical History:  Procedure Laterality Date   AMPUTATION Left 04/19/2016   Procedure: Left Foot 4th Toe Amputation vs. Transmetatarsal;  Surgeon: Jerona Harden GAILS, MD;  Location: [facility] OR;  Service: Orthopedics;  Laterality: Left;   ATRIAL FIBRILLATION ABLATION N/A 10/11/2023   Procedure: ATRIAL FIBRILLATION ABLATION;  Surgeon: Nancey Eulas BRAVO, MD;  Location: [facility] INVASIVE CV LAB;  Service: Cardiovascular;  Laterality: N/A;   BIOPSY  04/07/2019   Procedure: BIOPSY;  Surgeon: Elicia Claw, MD;  Location: [facility] ENDOSCOPY;  Service: Gastroenterology;;   CATARACT EXTRACTION  02/2010; 03/2010   COLONOSCOPY WITH PROPOFOL  N/A 09/14/2014   Procedure: COLONOSCOPY WITH PROPOFOL ;  Surgeon: Gladis MARLA Louder,  MD;  Location: [facility];  Service: Endoscopy;  Laterality: N/A;   COLONOSCOPY WITH PROPOFOL  N/A 04/07/2019   Procedure: COLONOSCOPY WITH PROPOFOL ;  Surgeon: Elicia Claw, MD;  Location: [facility];  Service: Gastroenterology;  Laterality: N/A;   CORONARY ANGIOPLASTY WITH STENT PLACEMENT  07/12/2004; 08/03/2004   total of 3 stents   CORONARY STENT INTERVENTION N/A 08/23/2022   Procedure: CORONARY STENT INTERVENTION;  Surgeon: Swaziland, Peter M, MD;  Location: [facility] INVASIVE CV LAB;  Service: Cardiovascular;  Laterality: N/A;   I & D EXTREMITY Right 06/04/2014   Procedure: IRRIGATION AND DEBRIDEMENT EXTREMITY;  Surgeon: Lonni CINDERELLA Poli, MD;  Location: [facility] OR;  Service: Orthopedics;  Laterality: Right;   LEFT HEART CATH AND CORONARY ANGIOGRAPHY N/A 08/23/2022   Procedure: LEFT HEART CATH AND CORONARY ANGIOGRAPHY;  Surgeon: Swaziland, Peter M, MD;  Location: [facility] INVASIVE CV LAB;  Service: Cardiovascular;  Laterality: N/A;   MASS EXCISION  10/04/2011   Procedure: EXCISION MASS;  Surgeon: Arley JONELLE Curia, MD;  Location: [facility];  Service: Orthopedics;  Laterality: Left;  excision cyst debridment ip joint of left thumb   PROSTATECTOMY     right below knee amputation  2018   ROUX-EN-Y GASTRIC BYPASS  10/10/2010  laparoscopic   SHOULDER ARTHROSCOPY WITH ROTATOR CUFF REPAIR AND SUBACROMIAL DECOMPRESSION Right 03/23/2020   Procedure: RIGHT SHOULDER ARTHROSCOPY WITH DEBRIDEMENT AND  ROTATOR CUFF REPAIR;  Surgeon: Vernetta Lonni GRADE, MD;  Location: [facility] OR;  Service: Orthopedics;  Laterality: Right;   TOE AMPUTATION  02/23/2006   left foot second ray amputation   TOTAL HIP ARTHROPLASTY Right 03/29/2020   Procedure: TOTAL HIP ARTHROPLASTY POSTERIOR APPROACH;  Surgeon: Jerri Kay HERO, MD;  Location: [facility] OR;  Service: Orthopedics;  Laterality: Right;   TRANSMETATARSAL AMPUTATION Left    TRIGGER FINGER RELEASE Right 09/16/2019   Procedure: RELEASE TRIGGER FINGER/A-1 PULLEY;  Surgeon: Murrell Kuba,  MD;  Location: [facility];  Service: Orthopedics;  Laterality: Right;  IV REGIONAL FOREARM BLOCK   Patient Active Problem List   Diagnosis Date Noted   Atrial flutter (HCC) 06/27/2023   Paroxysmal atrial fibrillation with RVR (HCC) 08/23/2022   Ascending aortic aneurysm 08/23/2022   Bradycardia 08/22/2022   NSTEMI (non-ST elevated myocardial infarction) (HCC) 08/22/2022   Atrial fibrillation with RVR (HCC) 08/22/2022   Hypotension 01/07/2021   S/P arthroscopy of right shoulder 06/02/2020   Closed fracture of neck of right femur (HCC)    Hip fracture (HCC) 03/27/2020   Nontraumatic complete tear of right rotator cuff 02/26/2020   S/P transmetatarsal amputation of foot, left (HCC) 04/25/2016   Chronic osteomyelitis of toe, left (HCC)    Chronic diastolic CHF (congestive heart failure) (HCC) 09/25/2015   Aortic stenosis 08/04/2014   Diabetic foot infection (HCC) 06/03/2014   Peripheral vascular disease 06/03/2014   Coronary artery disease involving native coronary artery of native heart without angina pectoris 07/29/2013   Trifascicular block 07/29/2013   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 07/26/2011   Hypothyroidism 01/22/2007   Type 2 diabetes mellitus with vascular disease (HCC) 01/22/2007   HYPERLIPIDEMIA, MIXED 01/22/2007   MYOCARDIAL INFARCTION, HX OF 01/22/2007   ANEMIA, IRON DEFICIENCY, HX OF 01/22/2007    PCP: Trula SHAUNNA Brim, MD   REFERRING PROVIDER: Cordella Glendia Hutchinson, MD   REFERRING DIAG: [id number] (ICD-10-CM) - Chronic right shoulder pain   THERAPY DIAG:  Chronic right shoulder pain  Stiffness of right shoulder, not elsewhere classified  Other abnormalities of gait and mobility  Muscle weakness (generalized)  Pain in left hip  Rationale for Evaluation and Treatment: Rehabilitation  ONSET DATE: spring 2025  SUBJECTIVE:   SUBJECTIVE STATEMENT: Patient reports increased shoulder pain with movement to where he can't reach behind his  head.   Patient noting difficulty and pain with shoulder abduction HEP activity.  PERTINENT HISTORY: Patient has undergone Rt TTA, Rt shoulder arthroscopy, and Rt THA. Currently having Rt shoulder pain after 1 fall in the spring where he landed on Rt shoulder. Patient is now experiencing pain in Rt shoulder with lifting and sleeping. Patient also reports Lt hip pain that is currently being treated with injection, tramadol , and prednisone.    See PMH or personal factors for comorbidities  PAIN:  Are you having pain? Yes: NPRS scale: 7-8/10  Pain location: Rt shoulder, bicep with reaching  Pain description: sharp Aggravating factors: sleeping, lifting, reaching Relieving factors: rest, injection  PRECAUTIONS: Fall and Other: cardiac  RED FLAGS: None   WEIGHT BEARING RESTRICTIONS: No  FALLS:  Has patient fallen in last 6 months? Yes. Number of falls 1; thought he was locked into the prosthesis and was not    LIVING ENVIRONMENT: Lives with: lives with their spouse Lives in: House/apartment Stairs: Yes:  Internal: 12 steps; rail/wall and External: 1 to get in the house from garage, 3 steps to patio steps; one rail on front, double to patio  Has following equipment at home: unable to assess on eval  OCCUPATION: retired, IT trainer  PLOF: Independent and Rt TTA prosthesis   PATIENT GOALS: get rid of the pain to lift and move things around  NEXT MD VISIT: 10/26/2023 for injection in Lt hip   OBJECTIVE:  Note: Objective measures were completed at Evaluation unless otherwise noted.  DIAGNOSTIC FINDINGS:  IMPRESSION: 1. Mild spurring of the left acetabulum and femoral head. 2. Mild proximal hamstring tendinopathy. 3. Trace nonspecific edema medially along the hip adductor musculature. 4. Contralateral (right) total hip prosthesis.  PATIENT SURVEYS:  PSFS: THE PATIENT SPECIFIC FUNCTIONAL SCALE  Place score of 0-10 (0 = unable to perform activity and 10 = able to perform activity at  the same level as before injury or problem)  Activity Date: 10/25/2023 11/09/2023   Raising right arm above shoulder  5 2   2. Carrying load with right arm  4 7   3. Sleeping on right side  4 7   4.      Total Score 4.33 5.33     Total Score = Sum of activity scores/number of activities  Minimally Detectable Change: 3 points (for single activity); 2 points (for average score)  [facility] (nd). The Patient Specific Functional Scale . Retrieved from [email or web address]   COGNITION: Overall cognitive status: Within functional limits for tasks assessed     SENSATION: Not formally assessed on eval  EDEMA:    MUSCLE LENGTH: Not formally assessed on eval secondary to focus on UE  POSTURE: rounded shoulders, forward head, and increased thoracic kyphosis  PALPATION: Not formally assessed on eval  LOWER EXTREMITY ROM: not assessed on eval secondary to Rt UE focus   ROM Right Eval 10/25/2023 Left Eval 10/25/2023  Hip flexion    Hip extension    Hip abduction    Hip adduction    Hip internal rotation    Hip external rotation    Knee flexion    Knee extension    Ankle dorsiflexion    Ankle plantarflexion    Ankle inversion    Ankle eversion     (Blank rows = not tested)  LOWER EXTREMITY MMT: not assessed on eval secondary to Rt UE focus   MMT Right eval Left eval  Hip flexion    Hip extension    Hip abduction    Hip adduction    Hip internal rotation    Hip external rotation    Knee flexion    Knee extension    Ankle dorsiflexion    Ankle plantarflexion    Ankle inversion    Ankle eversion     (Blank rows = not tested)  LOWER EXTREMITY SPECIAL TESTS:  Not formally assessed on eval   Upper Extremity ROM: 10/25/2023 SEATED , AROM Shoulder flexion: Rt: 80deg Lt: WFL Shoulder abduction: Rt: 72deg Lt: WFL Shoulder ER: Rt: L3 Lt: T3 Shoulder IR: Rt: T1 Lt: T10  Upper Extremity MMT:  SEATED  ; Rt within available ROM  Shoulder flexion: Rt: 4+/5 Lt: 4+/5 Shoulder abduction: Rt:4+/5 Lt: 4+/5 Shoulder ER: Rt: 3+/5 Lt: 3+/5 Shoulder IR: Rt:3+/5  Lt: 4-/5  UE special tests: 10/25/2023 Full can: negative Empty can: postivie, tension and painful   FUNCTIONAL TESTS:  Not formally assessed on eval  GAIT: Distance walked: not formally assessed  Assistive  device utilized: Rt TTA prosthesis  Level of assistance: supervision Comments: Rt TTA prosthesis appears to be short                                                                                                                                TREATMENT DATE:  11/09/2023 TherEx: UBE with bilat UE level 2.5 for 4 min fwd/4 min back Supine with foam roller on spine 2 min hold  Bicep isometrics in 3 directions for 1x10 with 3s hold (hand in supine, neutral hand, prone hand)  Doorway bicep stretch with arm straight 3x30s  UE ranger into shoulder flexion 2x10   Manual:  TTP with bicep, delt, inferior glide into abduction, and posterior glide   11/02/2023 TherEx:  UBE with bilat UE level 2 , 4 min fwd/4 min back Seated bicep isometric with palm up 2x10 with 3s hold  Seated bicep isometric with neutral hand 2x10 with 3s hold  Seated bicep isometric with pronated hand 2x10 with 3s hold   Neuro Re-Ed:  Standing rows with blue TB 2x15  Straight arm pulldowns with green TB 2x15  Attempted ER with scapular retraction using red TB, but increased pain; discontinued   10/29/2023 TherEx: Pulleys , 3 min shoulder flexion, 3 min shoulder scaption  Doorway stretch with neutral hand for bicep 3x30s  PT discussed adding this to HEP  Attempted supine AAROM with PVC noting pain in bicep region ~70deg ; discontinued   Neuro Re-Ed:  Standing rows with blue TB 2x12 with 3s hold  Standing straight arm pulldowns with green TB 2x12 with 2s hold  Discussed relationship of posture with shoulder pain/bicep pain  Manual:  Grade 2  posterior and inferior glides, then performed abduction PROM to ~100deg with no pain noted in shoulder  STM and palpation to bicep, UT, supraspinatus  Special test for bicep, positive    10/25/2023 TherEx:  HEP handout provided with patient performing one set of each activity for appropriate form. Verbal and tactile cues required.   Self-Care: POC and addition of hip to POC if deemed necessary    PATIENT EDUCATION:  Education details: HEP, POC Person educated: Patient Education method: Explanation, Demonstration, Tactile cues, Verbal cues, and Handouts Education comprehension: verbalized understanding and returned demonstration  HOME EXERCISE PROGRAM: Access Code: [email or web address] URL: [email or web address] Date: 10/25/2023 Prepared by: Susannah Daring  Exercises - Seated Cervical Retraction  - 1 x daily - 7 x weekly - 2 sets - 10 reps - 3s hold - Seated Scapular Retraction  - 1 x daily - 7 x weekly - 2 sets - 10 reps - 3s hold - Supine Shoulder Flexion with Dowel  - 1 x daily - 7 x weekly - 2 sets - 10 reps - 3s hold - Supine Shoulder Abduction AAROM with Dowel  - 1 x daily - 7 x weekly - 2 sets - 10 reps - 3s hold  ASSESSMENT:  CLINICAL IMPRESSION: Patient arrived  to session noting increased frequency of higher level pain. Patient tolerated most activities, though highly painful with posterior glides, PROM/AROM abduction, and palpation to biceps. Patient had improved symptoms with continued repetitions with UE ranger. Patient will continue to benefit from skilled PT.   OBJECTIVE IMPAIRMENTS: Abnormal gait, decreased activity tolerance, decreased balance, decreased coordination, decreased mobility, difficulty walking, decreased ROM, decreased strength, impaired UE functional use, improper body mechanics, postural dysfunction, prosthetic dependency , and pain.   ACTIVITY LIMITATIONS: carrying, lifting, sleeping, bed mobility, and reach over head  PARTICIPATION LIMITATIONS:  cleaning, community activity, and church  PERSONAL FACTORS: Past/current experiences and 3+ comorbidities: hypothyroidism, HTN, HLD, GERD, A-fib (s/p ablation), DM2, arthritis, PVD, CAD are also affecting patient's functional outcome.   REHAB POTENTIAL: Good  CLINICAL DECISION MAKING: Evolving/moderate complexity  EVALUATION COMPLEXITY: Moderate   GOALS: Goals reviewed with patient? Yes  SHORT TERM GOALS: Target date: 11/08/2023 Patient will show compliance with initial HEP. Baseline: Goal status: GOAL MET, 11/09/2023  2.  Patient will report pain levels no greater than 6/10 in order to show improved overall quality of life. Baseline:  Goal status: goal ongoing, 11/09/2023    LONG TERM GOALS: Target date: 11/22/2023  Patient will be independent with final HEP in order to maintain and progress upon functional gains made within PT. Baseline:  Goal status: INITIAL  2.  Patient will report pain levels no greater than 3/10 in order to show improved overall quality of life. Baseline:  Goal status: INITIAL  3.  Patient will increase PSFS to at least 6.33 in order to show a significant improvement in subjective disability rating. Baseline:  Goal status: INITIAL  4.  Patient will increase Rt shoulder flexion ROM to at least 110deg in order to improve functional mobility. Baseline:  Goal status: INITIAL  5.  Patient will increase Rt shoulder abduction ROM to at least 95deg in order to improve functional mobility. Baseline:  Goal status: INITIAL     PLAN:  PT FREQUENCY: 1-2x/week  PT DURATION: 4 weeks  PLANNED INTERVENTIONS: [id number]- PT Re-evaluation, 97750- Physical Performance Testing, 97110-Therapeutic exercises, 97530- Therapeutic activity, [id number]- Neuromuscular re-education, 97535- Self Care, [id number]- Manual therapy, [id number]- Gait training, [id number]- Prosthetic Initial , [id number]- Orthotic/Prosthetic subsequent, [phone]- Canalith repositioning, [id number]- Aquatic Therapy, H9716-  Electrical stimulation (unattended), [phone]- Electrical stimulation (manual), [id number]- Vasopneumatic device, [id number]- Ultrasound, [id number]- Traction (mechanical), [id number]- Ionotophoresis 4mg /ml Dexamethasone , 97597- Wound care (first 20 sq cm), 97598- Wound care (each additional 20 sq cm), 20560 (1-2 muscles), 20561 (3+ muscles)- Dry Needling, Patient/Family education, Balance training, Stair training, Taping, Joint mobilization, Joint manipulation, Spinal manipulation, Spinal mobilization, Scar mobilization, Compression bandaging, Vestibular training, DME instructions, Cryotherapy, and Moist heat  PLAN FOR NEXT SESSION:   further bicep assessment, assess Lt hip if necessary, generalized mobility and strengthening for Rt shoulder, postural strengthening   Susannah Daring, PT, DPT 11/09/23 12:00 PM

## 2023-11-09 ENCOUNTER — Ambulatory Visit: Payer: Self-pay

## 2023-11-09 DIAGNOSIS — R2689 Other abnormalities of gait and mobility: Secondary | ICD-10-CM

## 2023-11-09 DIAGNOSIS — M25611 Stiffness of right shoulder, not elsewhere classified: Secondary | ICD-10-CM

## 2023-11-09 DIAGNOSIS — M25552 Pain in left hip: Secondary | ICD-10-CM | POA: Diagnosis not present

## 2023-11-09 DIAGNOSIS — G8929 Other chronic pain: Secondary | ICD-10-CM | POA: Diagnosis not present

## 2023-11-09 DIAGNOSIS — M6281 Muscle weakness (generalized): Secondary | ICD-10-CM

## 2023-11-09 DIAGNOSIS — M25511 Pain in right shoulder: Secondary | ICD-10-CM

## 2023-11-09 NOTE — Therapy (Signed)
 OUTPATIENT PHYSICAL THERAPY UPPER EXTREMITY TREATMENT   Patient Name: [name] MRN: [id number] DOB:03/13/1950, 73 y.o., male Today's Date: 11/12/2023  END OF SESSION:  PT End of Session - 11/12/23 1044     Visit Number 5    Number of Visits 8    Date for Recertification  11/22/23    Authorization Type MEDICARE AND BCBS    Progress Note Due on Visit 10    PT Start Time 1101    PT Stop Time 1141    PT Time Calculation (min) 40 min    Activity Tolerance Patient tolerated treatment well    Behavior During Therapy [facility] for tasks assessed/performed             Past Medical History:  Diagnosis Date   Anemia    low iron   Arthritis    Coronary artery disease 2006   a. remote stenting to ramus, prox LAD x2.   Dental crowns present    Diabetes mellitus    First degree AV block    GERD (gastroesophageal reflux disease)    Heart attack (HCC) 06/2004   Heart murmur    aortic   Hx MRSA infection 02/2006   Hyperlipidemia    Hypertension    hx. of - has not been on med. since losing wt. after gastric bypass   Hypothyroidism    Morbid obesity (HCC)    Mucoid cyst of joint 09/2011   left thumb   Sleep apnea    sleep study 03/29/2011; no CPAP use, lost >130lbs   Trifascicular block    Past Surgical History:  Procedure Laterality Date   AMPUTATION Left 04/19/2016   Procedure: Left Foot 4th Toe Amputation vs. Transmetatarsal;  Surgeon: Jerona Harden GAILS, MD;  Location: [facility] OR;  Service: Orthopedics;  Laterality: Left;   ATRIAL FIBRILLATION ABLATION N/A 10/11/2023   Procedure: ATRIAL FIBRILLATION ABLATION;  Surgeon: Nancey Eulas BRAVO, MD;  Location: [facility] INVASIVE CV LAB;  Service: Cardiovascular;  Laterality: N/A;   BIOPSY  04/07/2019   Procedure: BIOPSY;  Surgeon: Elicia Claw, MD;  Location: [facility];  Service: Gastroenterology;;   CATARACT EXTRACTION  02/2010; 03/2010   COLONOSCOPY WITH PROPOFOL  N/A 09/14/2014   Procedure: COLONOSCOPY WITH PROPOFOL ;  Surgeon: Gladis MARLA Louder,  MD;  Location: [facility];  Service: Endoscopy;  Laterality: N/A;   COLONOSCOPY WITH PROPOFOL  N/A 04/07/2019   Procedure: COLONOSCOPY WITH PROPOFOL ;  Surgeon: Elicia Claw, MD;  Location: [facility];  Service: Gastroenterology;  Laterality: N/A;   CORONARY ANGIOPLASTY WITH STENT PLACEMENT  07/12/2004; 08/03/2004   total of 3 stents   CORONARY STENT INTERVENTION N/A 08/23/2022   Procedure: CORONARY STENT INTERVENTION;  Surgeon: Swaziland, Peter M, MD;  Location: [facility] INVASIVE CV LAB;  Service: Cardiovascular;  Laterality: N/A;   I & D EXTREMITY Right 06/04/2014   Procedure: IRRIGATION AND DEBRIDEMENT EXTREMITY;  Surgeon: Lonni CINDERELLA Poli, MD;  Location: [facility] OR;  Service: Orthopedics;  Laterality: Right;   LEFT HEART CATH AND CORONARY ANGIOGRAPHY N/A 08/23/2022   Procedure: LEFT HEART CATH AND CORONARY ANGIOGRAPHY;  Surgeon: Swaziland, Peter M, MD;  Location: [facility] INVASIVE CV LAB;  Service: Cardiovascular;  Laterality: N/A;   MASS EXCISION  10/04/2011   Procedure: EXCISION MASS;  Surgeon: Arley JONELLE Curia, MD;  Location: [facility];  Service: Orthopedics;  Laterality: Left;  excision cyst debridment ip joint of left thumb   PROSTATECTOMY     right below knee amputation  2018   ROUX-EN-Y GASTRIC BYPASS  10/10/2010   laparoscopic   SHOULDER ARTHROSCOPY WITH ROTATOR CUFF REPAIR AND SUBACROMIAL DECOMPRESSION Right 03/23/2020   Procedure: RIGHT SHOULDER ARTHROSCOPY WITH DEBRIDEMENT AND  ROTATOR CUFF REPAIR;  Surgeon: Vernetta Lonni GRADE, MD;  Location: [facility] OR;  Service: Orthopedics;  Laterality: Right;   TOE AMPUTATION  02/23/2006   left foot second ray amputation   TOTAL HIP ARTHROPLASTY Right 03/29/2020   Procedure: TOTAL HIP ARTHROPLASTY POSTERIOR APPROACH;  Surgeon: Jerri Kay HERO, MD;  Location: [facility] OR;  Service: Orthopedics;  Laterality: Right;   TRANSMETATARSAL AMPUTATION Left    TRIGGER FINGER RELEASE Right 09/16/2019   Procedure: RELEASE TRIGGER FINGER/A-1 PULLEY;  Surgeon: Murrell Kuba,  MD;  Location: [facility];  Service: Orthopedics;  Laterality: Right;  IV REGIONAL FOREARM BLOCK   Patient Active Problem List   Diagnosis Date Noted   Atrial flutter (HCC) 06/27/2023   Paroxysmal atrial fibrillation with RVR (HCC) 08/23/2022   Ascending aortic aneurysm 08/23/2022   Bradycardia 08/22/2022   NSTEMI (non-ST elevated myocardial infarction) (HCC) 08/22/2022   Atrial fibrillation with RVR (HCC) 08/22/2022   Hypotension 01/07/2021   S/P arthroscopy of right shoulder 06/02/2020   Closed fracture of neck of right femur (HCC)    Hip fracture (HCC) 03/27/2020   Nontraumatic complete tear of right rotator cuff 02/26/2020   S/P transmetatarsal amputation of foot, left (HCC) 04/25/2016   Chronic osteomyelitis of toe, left (HCC)    Chronic diastolic CHF (congestive heart failure) (HCC) 09/25/2015   Aortic stenosis 08/04/2014   Diabetic foot infection (HCC) 06/03/2014   Peripheral vascular disease 06/03/2014   Coronary artery disease involving native coronary artery of native heart without angina pectoris 07/29/2013   Trifascicular block 07/29/2013   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 07/26/2011   Hypothyroidism 01/22/2007   Type 2 diabetes mellitus with vascular disease (HCC) 01/22/2007   HYPERLIPIDEMIA, MIXED 01/22/2007   MYOCARDIAL INFARCTION, HX OF 01/22/2007   ANEMIA, IRON DEFICIENCY, HX OF 01/22/2007    PCP: Trula SHAUNNA Brim, MD   REFERRING PROVIDER: Cordella Glendia Hutchinson, MD   REFERRING DIAG: [phone] (ICD-10-CM) - Chronic right shoulder pain   THERAPY DIAG:  Chronic right shoulder pain  Stiffness of right shoulder, not elsewhere classified  Other abnormalities of gait and mobility  Muscle weakness (generalized)  Pain in left hip  Rationale for Evaluation and Treatment: Rehabilitation  ONSET DATE: spring 2025  SUBJECTIVE:   SUBJECTIVE STATEMENT: Patient reports   Patient noting difficulty and pain with shoulder abduction HEP  activity.  PERTINENT HISTORY: Patient has undergone Rt TTA, Rt shoulder arthroscopy, and Rt THA. Currently having Rt shoulder pain after 1 fall in the spring where he landed on Rt shoulder. Patient is now experiencing pain in Rt shoulder with lifting and sleeping. Patient also reports Lt hip pain that is currently being treated with injection, tramadol , and prednisone.    See PMH or personal factors for comorbidities  PAIN:  Are you having pain? Yes: NPRS scale: 4-5/10, lifting above shoulder reaches 7-8/10  Pain location: Rt shoulder, bicep with reaching  Pain description: sharp Aggravating factors: sleeping, lifting, reaching Relieving factors: rest, injection  PRECAUTIONS: Fall and Other: cardiac  RED FLAGS: None   WEIGHT BEARING RESTRICTIONS: No  FALLS:  Has patient fallen in last 6 months? Yes. Number of falls 1; thought he was locked into the prosthesis and was not    LIVING ENVIRONMENT: Lives with: lives with their spouse Lives in: House/apartment Stairs: Yes: Internal: 12 steps; rail/wall and  External: 1 to get in the house from garage, 3 steps to patio steps; one rail on front, double to patio  Has following equipment at home: unable to assess on eval  OCCUPATION: retired, IT trainer  PLOF: Independent and Rt TTA prosthesis   PATIENT GOALS: get rid of the pain to lift and move things around  NEXT MD VISIT: 10/26/2023 for injection in Lt hip   OBJECTIVE:  Note: Objective measures were completed at Evaluation unless otherwise noted.  DIAGNOSTIC FINDINGS:  IMPRESSION: 1. Mild spurring of the left acetabulum and femoral head. 2. Mild proximal hamstring tendinopathy. 3. Trace nonspecific edema medially along the hip adductor musculature. 4. Contralateral (right) total hip prosthesis.  PATIENT SURVEYS:  PSFS: THE PATIENT SPECIFIC FUNCTIONAL SCALE  Place score of 0-10 (0 = unable to perform activity and 10 = able to perform activity at the same level as before injury or  problem)  Activity Date: 10/25/2023 11/09/2023   Raising right arm above shoulder  5 2   2. Carrying load with right arm  4 7   3. Sleeping on right side  4 7   4.      Total Score 4.33 5.33     Total Score = Sum of activity scores/number of activities  Minimally Detectable Change: 3 points (for single activity); 2 points (for average score)  [facility] (nd). The Patient Specific Functional Scale . Retrieved from [email or web address]   COGNITION: Overall cognitive status: Within functional limits for tasks assessed     SENSATION: Not formally assessed on eval  EDEMA:    MUSCLE LENGTH: Not formally assessed on eval secondary to focus on UE  POSTURE: rounded shoulders, forward head, and increased thoracic kyphosis  PALPATION: Not formally assessed on eval  LOWER EXTREMITY ROM: not assessed on eval secondary to Rt UE focus   ROM Right Eval 10/25/2023 Left Eval 10/25/2023  Hip flexion    Hip extension    Hip abduction    Hip adduction    Hip internal rotation    Hip external rotation    Knee flexion    Knee extension    Ankle dorsiflexion    Ankle plantarflexion    Ankle inversion    Ankle eversion     (Blank rows = not tested)  LOWER EXTREMITY MMT: not assessed on eval secondary to Rt UE focus   MMT Right eval Left eval  Hip flexion    Hip extension    Hip abduction    Hip adduction    Hip internal rotation    Hip external rotation    Knee flexion    Knee extension    Ankle dorsiflexion    Ankle plantarflexion    Ankle inversion    Ankle eversion     (Blank rows = not tested)  LOWER EXTREMITY SPECIAL TESTS:  Not formally assessed on eval   Upper Extremity ROM: 10/25/2023 SEATED , AROM Shoulder flexion: Rt: 80deg Lt: WFL Shoulder abduction: Rt: 72deg Lt: WFL Shoulder ER: Rt: L3 Lt: T3 Shoulder IR: Rt: T1 Lt: T10  Upper Extremity MMT:  SEATED ; Rt within available ROM   Shoulder flexion: Rt: 4+/5 Lt: 4+/5 Shoulder abduction: Rt:4+/5 Lt: 4+/5 Shoulder ER: Rt: 3+/5 Lt: 3+/5 Shoulder IR: Rt:3+/5  Lt: 4-/5  UE special tests: 10/25/2023 Full can: negative Empty can: postivie, tension and painful   FUNCTIONAL TESTS:  Not formally assessed on eval  GAIT: Distance walked: not formally assessed  Assistive device utilized: Rt TTA prosthesis  Level of assistance: supervision Comments: Rt TTA prosthesis appears to be short                                                                                                                                TREATMENT DATE:  11/12/2023 TherEx:  Pulleys 3 min shoulder flexion, 3 min scaption  Supine AAROM shoulder flexion with 2# bar 2x12  Supine AAROM ER with 2# bar 2x12  Supine PT assisted abduction AAROM 1x10 with 10s hold in max stretch  Bicep isometrics in 3 directions ((hand in supine, neutral hand, prone hand) 1x10 with 3s hold   Self-Care:  Discussed MRI results, potential shoulder follow-up, POC   11/09/2023 TherEx: UBE with bilat UE level 2.5 for 4 min fwd/4 min back Supine with foam roller on spine 2 min hold  Bicep isometrics in 3 directions for 1x10 with 3s hold (hand in supine, neutral hand, prone hand)  Doorway bicep stretch with arm straight 3x30s  UE ranger into shoulder flexion 2x10   Manual:  TTP with bicep, delt, inferior glide into abduction, and posterior glide   11/02/2023 TherEx:  UBE with bilat UE level 2 , 4 min fwd/4 min back Seated bicep isometric with palm up 2x10 with 3s hold  Seated bicep isometric with neutral hand 2x10 with 3s hold  Seated bicep isometric with pronated hand 2x10 with 3s hold   Neuro Re-Ed:  Standing rows with blue TB 2x15  Straight arm pulldowns with green TB 2x15  Attempted ER with scapular retraction using red TB, but increased pain; discontinued   10/29/2023 TherEx: Pulleys , 3 min shoulder flexion, 3 min shoulder scaption  Doorway stretch with  neutral hand for bicep 3x30s  PT discussed adding this to HEP  Attempted supine AAROM with PVC noting pain in bicep region ~70deg ; discontinued   Neuro Re-Ed:  Standing rows with blue TB 2x12 with 3s hold  Standing straight arm pulldowns with green TB 2x12 with 2s hold  Discussed relationship of posture with shoulder pain/bicep pain  Manual:  Grade 2 posterior and inferior glides, then performed abduction PROM to ~100deg with no pain noted in shoulder  STM and palpation to bicep, UT, supraspinatus  Special test for bicep, positive    PATIENT EDUCATION:  Education details: HEP, POC Person educated: Patient Education method: Explanation, Demonstration, Tactile cues, Verbal cues, and Handouts Education comprehension: verbalized understanding and returned demonstration  HOME EXERCISE PROGRAM: Access Code: [email or web address] URL: [email or web address] Date: 10/25/2023 Prepared by: Susannah Daring  Exercises - Seated Cervical Retraction  - 1 x daily - 7 x weekly - 2 sets - 10 reps - 3s hold - Seated Scapular Retraction  - 1 x daily - 7 x weekly - 2 sets - 10 reps - 3s hold - Supine Shoulder Flexion with Dowel  - 1 x daily - 7 x weekly - 2 sets - 10 reps - 3s hold - Supine Shoulder Abduction AAROM  with Dowel  - 1 x daily - 7 x weekly - 2 sets - 10 reps - 3s hold  ASSESSMENT:  CLINICAL IMPRESSION: Patient arrived to session noting no changes in shoulder symptoms with increase in difficulty secondary to low back pain. Patient tolerated all activities this date but continues to have deficits with ROM and pain. Patient will continue to benefit from skilled PT.  OBJECTIVE IMPAIRMENTS: Abnormal gait, decreased activity tolerance, decreased balance, decreased coordination, decreased mobility, difficulty walking, decreased ROM, decreased strength, impaired UE functional use, improper body mechanics, postural dysfunction, prosthetic dependency , and pain.   ACTIVITY LIMITATIONS: carrying,  lifting, sleeping, bed mobility, and reach over head  PARTICIPATION LIMITATIONS: cleaning, community activity, and church  PERSONAL FACTORS: Past/current experiences and 3+ comorbidities: hypothyroidism, HTN, HLD, GERD, A-fib (s/p ablation), DM2, arthritis, PVD, CAD are also affecting patient's functional outcome.   REHAB POTENTIAL: Good  CLINICAL DECISION MAKING: Evolving/moderate complexity  EVALUATION COMPLEXITY: Moderate   GOALS: Goals reviewed with patient? Yes  SHORT TERM GOALS: Target date: 11/08/2023 Patient will show compliance with initial HEP. Baseline: Goal status: GOAL MET, 11/09/2023  2.  Patient will report pain levels no greater than 6/10 in order to show improved overall quality of life. Baseline:  Goal status: goal ongoing, 11/09/2023    LONG TERM GOALS: Target date: 11/22/2023  Patient will be independent with final HEP in order to maintain and progress upon functional gains made within PT. Baseline:  Goal status: INITIAL  2.  Patient will report pain levels no greater than 3/10 in order to show improved overall quality of life. Baseline:  Goal status: INITIAL  3.  Patient will increase PSFS to at least 6.33 in order to show a significant improvement in subjective disability rating. Baseline:  Goal status: INITIAL  4.  Patient will increase Rt shoulder flexion ROM to at least 110deg in order to improve functional mobility. Baseline:  Goal status: INITIAL  5.  Patient will increase Rt shoulder abduction ROM to at least 95deg in order to improve functional mobility. Baseline:  Goal status: INITIAL     PLAN:  PT FREQUENCY: 1-2x/week  PT DURATION: 4 weeks  PLANNED INTERVENTIONS: [id number]- PT Re-evaluation, 97750- Physical Performance Testing, 97110-Therapeutic exercises, 97530- Therapeutic activity, [id number]- Neuromuscular re-education, 97535- Self Care, [id number]- Manual therapy, [id number]- Gait training, [id number]- Prosthetic Initial , [id number]-  Orthotic/Prosthetic subsequent, [phone]- Canalith repositioning, [id number]- Aquatic Therapy, [id number]- Electrical stimulation (unattended), [id number]- Electrical stimulation (manual), [id number]- Vasopneumatic device, [id number]- Ultrasound, [id number]- Traction (mechanical), [id number]- Ionotophoresis 4mg /ml Dexamethasone , 97597- Wound care (first 20 sq cm), 97598- Wound care (each additional 20 sq cm), 20560 (1-2 muscles), 20561 (3+ muscles)- Dry Needling, Patient/Family education, Balance training, Stair training, Taping, Joint mobilization, Joint manipulation, Spinal manipulation, Spinal mobilization, Scar mobilization, Compression bandaging, Vestibular training, DME instructions, Cryotherapy, and Moist heat  PLAN FOR NEXT SESSION:    further bicep assessment, assess Lt hip if necessary, generalized mobility and strengthening for Rt shoulder, postural strengthening   Susannah Daring, PT, DPT 11/12/23 11:46 AM

## 2023-11-12 ENCOUNTER — Ambulatory Visit (INDEPENDENT_AMBULATORY_CARE_PROVIDER_SITE_OTHER)

## 2023-11-12 DIAGNOSIS — R2689 Other abnormalities of gait and mobility: Secondary | ICD-10-CM

## 2023-11-12 DIAGNOSIS — M25511 Pain in right shoulder: Secondary | ICD-10-CM

## 2023-11-12 DIAGNOSIS — G8929 Other chronic pain: Secondary | ICD-10-CM

## 2023-11-12 DIAGNOSIS — M25611 Stiffness of right shoulder, not elsewhere classified: Secondary | ICD-10-CM | POA: Diagnosis not present

## 2023-11-12 DIAGNOSIS — M6281 Muscle weakness (generalized): Secondary | ICD-10-CM

## 2023-11-12 DIAGNOSIS — M25552 Pain in left hip: Secondary | ICD-10-CM

## 2023-11-14 ENCOUNTER — Encounter

## 2023-11-16 DIAGNOSIS — Z23 Encounter for immunization: Secondary | ICD-10-CM | POA: Diagnosis not present

## 2023-11-16 NOTE — Therapy (Signed)
 OUTPATIENT PHYSICAL THERAPY UPPER EXTREMITY TREATMENT   Patient Name: KOLSON CHOVANEC MRN: [id number] DOB:December 28, 1950, 73 y.o., male Today's Date: 11/19/2023  END OF SESSION:  PT End of Session - 11/19/23 1422     Visit Number 6    Number of Visits 8    Date for Recertification  11/22/23    Authorization Type MEDICARE AND BCBS    Progress Note Due on Visit 10    PT Start Time 1424    PT Stop Time 1513    PT Time Calculation (min) 49 min    Activity Tolerance Patient tolerated treatment well    Behavior During Therapy [facility] for tasks assessed/performed           Past Medical History:  Diagnosis Date   Anemia    low iron   Arthritis    Coronary artery disease 2006   a. remote stenting to ramus, prox LAD x2.   Dental crowns present    Diabetes mellitus    First degree AV block    GERD (gastroesophageal reflux disease)    Heart attack (HCC) 06/2004   Heart murmur    aortic   Hx MRSA infection 02/2006   Hyperlipidemia    Hypertension    hx. of - has not been on med. since losing wt. after gastric bypass   Hypothyroidism    Morbid obesity (HCC)    Mucoid cyst of joint 09/2011   left thumb   Sleep apnea    sleep study 03/29/2011; no CPAP use, lost >130lbs   Trifascicular block    Past Surgical History:  Procedure Laterality Date   AMPUTATION Left 04/19/2016   Procedure: Left Foot 4th Toe Amputation vs. Transmetatarsal;  Surgeon: Jerona Harden GAILS, MD;  Location: [facility] OR;  Service: Orthopedics;  Laterality: Left;   ATRIAL FIBRILLATION ABLATION N/A 10/11/2023   Procedure: ATRIAL FIBRILLATION ABLATION;  Surgeon: Nancey Eulas BRAVO, MD;  Location: [facility] INVASIVE CV LAB;  Service: Cardiovascular;  Laterality: N/A;   BIOPSY  04/07/2019   Procedure: BIOPSY;  Surgeon: Elicia Claw, MD;  Location: [facility];  Service: Gastroenterology;;   CATARACT EXTRACTION  02/2010; 03/2010   COLONOSCOPY WITH PROPOFOL  N/A 09/14/2014   Procedure: COLONOSCOPY WITH PROPOFOL ;  Surgeon: Gladis MARLA Louder, MD;   Location: [facility];  Service: Endoscopy;  Laterality: N/A;   COLONOSCOPY WITH PROPOFOL  N/A 04/07/2019   Procedure: COLONOSCOPY WITH PROPOFOL ;  Surgeon: Elicia Claw, MD;  Location: [facility];  Service: Gastroenterology;  Laterality: N/A;   CORONARY ANGIOPLASTY WITH STENT PLACEMENT  07/12/2004; 08/03/2004   total of 3 stents   CORONARY STENT INTERVENTION N/A 08/23/2022   Procedure: CORONARY STENT INTERVENTION;  Surgeon: Jordan, Peter M, MD;  Location: [facility] INVASIVE CV LAB;  Service: Cardiovascular;  Laterality: N/A;   I & D EXTREMITY Right 06/04/2014   Procedure: IRRIGATION AND DEBRIDEMENT EXTREMITY;  Surgeon: Lonni CINDERELLA Poli, MD;  Location: [facility] OR;  Service: Orthopedics;  Laterality: Right;   LEFT HEART CATH AND CORONARY ANGIOGRAPHY N/A 08/23/2022   Procedure: LEFT HEART CATH AND CORONARY ANGIOGRAPHY;  Surgeon: Jordan, Peter M, MD;  Location: [facility] INVASIVE CV LAB;  Service: Cardiovascular;  Laterality: N/A;   MASS EXCISION  10/04/2011   Procedure: EXCISION MASS;  Surgeon: Arley JONELLE Curia, MD;  Location: [facility];  Service: Orthopedics;  Laterality: Left;  excision cyst debridment ip joint of left thumb   PROSTATECTOMY     right below knee amputation  2018   ROUX-EN-Y GASTRIC BYPASS  10/10/2010  laparoscopic   SHOULDER ARTHROSCOPY WITH ROTATOR CUFF REPAIR AND SUBACROMIAL DECOMPRESSION Right 03/23/2020   Procedure: RIGHT SHOULDER ARTHROSCOPY WITH DEBRIDEMENT AND  ROTATOR CUFF REPAIR;  Surgeon: Vernetta Lonni GRADE, MD;  Location: [facility] OR;  Service: Orthopedics;  Laterality: Right;   TOE AMPUTATION  02/23/2006   left foot second ray amputation   TOTAL HIP ARTHROPLASTY Right 03/29/2020   Procedure: TOTAL HIP ARTHROPLASTY POSTERIOR APPROACH;  Surgeon: Jerri Kay HERO, MD;  Location: [facility] OR;  Service: Orthopedics;  Laterality: Right;   TRANSMETATARSAL AMPUTATION Left    TRIGGER FINGER RELEASE Right 09/16/2019   Procedure: RELEASE TRIGGER FINGER/A-1 PULLEY;  Surgeon: Murrell Kuba, MD;   Location: [facility];  Service: Orthopedics;  Laterality: Right;  IV REGIONAL FOREARM BLOCK   Patient Active Problem List   Diagnosis Date Noted   Atrial flutter (HCC) 06/27/2023   Paroxysmal atrial fibrillation with RVR (HCC) 08/23/2022   Ascending aortic aneurysm 08/23/2022   Bradycardia 08/22/2022   NSTEMI (non-ST elevated myocardial infarction) (HCC) 08/22/2022   Atrial fibrillation with RVR (HCC) 08/22/2022   Hypotension 01/07/2021   S/P arthroscopy of right shoulder 06/02/2020   Closed fracture of neck of right femur (HCC)    Hip fracture (HCC) 03/27/2020   Nontraumatic complete tear of right rotator cuff 02/26/2020   S/P transmetatarsal amputation of foot, left (HCC) 04/25/2016   Chronic osteomyelitis of toe, left (HCC)    Chronic diastolic CHF (congestive heart failure) (HCC) 09/25/2015   Aortic stenosis 08/04/2014   Diabetic foot infection (HCC) 06/03/2014   Peripheral vascular disease 06/03/2014   Coronary artery disease involving native coronary artery of native heart without angina pectoris 07/29/2013   Trifascicular block 07/29/2013   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 07/26/2011   Hypothyroidism 01/22/2007   Type 2 diabetes mellitus with vascular disease (HCC) 01/22/2007   HYPERLIPIDEMIA, MIXED 01/22/2007   MYOCARDIAL INFARCTION, HX OF 01/22/2007   ANEMIA, IRON DEFICIENCY, HX OF 01/22/2007    PCP: Trula SHAUNNA Brim, MD   REFERRING PROVIDER: Cordella Glendia Hutchinson, MD   REFERRING DIAG: [phone] (ICD-10-CM) - Chronic right shoulder pain   THERAPY DIAG:  Chronic right shoulder pain  Stiffness of right shoulder, not elsewhere classified  Other abnormalities of gait and mobility  Muscle weakness (generalized)  Pain in left hip  Rationale for Evaluation and Treatment: Rehabilitation  ONSET DATE: spring 2025  SUBJECTIVE:   SUBJECTIVE STATEMENT: Patient reports having received an injection in the Rt shoulder.    PERTINENT  HISTORY: Patient has undergone Rt TTA, Rt shoulder arthroscopy, and Rt THA. Currently having Rt shoulder pain after 1 fall in the spring where he landed on Rt shoulder. Patient is now experiencing pain in Rt shoulder with lifting and sleeping. Patient also reports Lt hip pain that is currently being treated with injection, tramadol , and prednisone.    See PMH or personal factors for comorbidities  PAIN:  Are you having pain? Yes: NPRS scale:   Pain location: Rt shoulder, bicep with reaching  Pain description: sharp Aggravating factors: sleeping, lifting, reaching Relieving factors: rest, injection  PRECAUTIONS: Fall and Other: cardiac  RED FLAGS: None   WEIGHT BEARING RESTRICTIONS: No  FALLS:  Has patient fallen in last 6 months? Yes. Number of falls 1; thought he was locked into the prosthesis and was not    LIVING ENVIRONMENT: Lives with: lives with their spouse Lives in: House/apartment Stairs: Yes: Internal: 12 steps; rail/wall and External: 1 to get in the house from garage, 3  steps to patio steps; one rail on front, double to patio  Has following equipment at home: unable to assess on eval  OCCUPATION: retired, IT TRAINER  PLOF: Independent and Rt TTA prosthesis   PATIENT GOALS: get rid of the pain to lift and move things around  NEXT MD VISIT: 10/26/2023 for injection in Lt hip   OBJECTIVE:  Note: Objective measures were completed at Evaluation unless otherwise noted.  DIAGNOSTIC FINDINGS:  IMPRESSION: 1. Mild spurring of the left acetabulum and femoral head. 2. Mild proximal hamstring tendinopathy. 3. Trace nonspecific edema medially along the hip adductor musculature. 4. Contralateral (right) total hip prosthesis.  PATIENT SURVEYS:  PSFS: THE PATIENT SPECIFIC FUNCTIONAL SCALE  Place score of 0-10 (0 = unable to perform activity and 10 = able to perform activity at the same level as before injury or problem)  Activity Date: 10/25/2023 11/09/2023   Raising right arm  above shoulder  5 2   2. Carrying load with right arm  4 7   3. Sleeping on right side  4 7   4.      Total Score 4.33 5.33     Total Score = Sum of activity scores/number of activities  Minimally Detectable Change: 3 points (for single activity); 2 points (for average score)  [facility] (nd). The Patient Specific Functional Scale . Retrieved from [email or web address]   COGNITION: Overall cognitive status: Within functional limits for tasks assessed     SENSATION: Not formally assessed on eval  EDEMA:    MUSCLE LENGTH: Not formally assessed on eval secondary to focus on UE  POSTURE: rounded shoulders, forward head, and increased thoracic kyphosis  PALPATION: Not formally assessed on eval  LOWER EXTREMITY ROM: not assessed on eval secondary to Rt UE focus   ROM Right Eval 10/25/2023 Left Eval 10/25/2023  Hip flexion    Hip extension    Hip abduction    Hip adduction    Hip internal rotation    Hip external rotation    Knee flexion    Knee extension    Ankle dorsiflexion    Ankle plantarflexion    Ankle inversion    Ankle eversion     (Blank rows = not tested)  LOWER EXTREMITY MMT: not assessed on eval secondary to Rt UE focus   MMT Right eval Left eval  Hip flexion    Hip extension    Hip abduction    Hip adduction    Hip internal rotation    Hip external rotation    Knee flexion    Knee extension    Ankle dorsiflexion    Ankle plantarflexion    Ankle inversion    Ankle eversion     (Blank rows = not tested)  LOWER EXTREMITY SPECIAL TESTS:  Not formally assessed on eval   Upper Extremity ROM: 10/25/2023 SEATED , AROM Shoulder flexion: Rt: 80deg Lt: WFL Shoulder abduction: Rt: 72deg Lt: WFL Shoulder ER: Rt: L3 Lt: T3 Shoulder IR: Rt: T1 Lt: T10  Upper Extremity MMT:  SEATED ; Rt within available ROM  Shoulder flexion: Rt: 4+/5 Lt: 4+/5 Shoulder abduction: Rt:4+/5 Lt:  4+/5 Shoulder ER: Rt: 3+/5 Lt: 3+/5 Shoulder IR: Rt:3+/5  Lt: 4-/5  UE special tests: 10/25/2023 Full can: negative Empty can: postivie, tension and painful   FUNCTIONAL TESTS:  Not formally assessed on eval  GAIT: Distance walked: not formally assessed  Assistive device utilized: Rt TTA prosthesis  Level of assistance: supervision Comments: Rt TTA prosthesis appears  to be short                                                                                                                                TREATMENT DATE:  11/19/2023 TherEx:  Pulleys 3 min shoulder flexion, 3 min scaption Bicep curls with focus on slow eccentrics for bicep loading 1x12 each direction (hands in supine, neutral, prone) Supine AAROM shoulder abduction with patient performing eccentric AROM 1x8 with 10s hold in max stretch  Patient transitioned to AROM concentric and eccentric for 1x2 with 10s hold in max stretch  PT discussed patient on transitioning from isometrics to eccentric control to challenge biceps without overworking secondary to potential tendonitis   Neuro Re-Ed:  Standing rows with cable machine 3x12 with 25# Supine with half foam roller on spine to stretch chest and biceps 1x2 min hold   11/12/2023 TherEx:  Pulleys 3 min shoulder flexion, 3 min scaption  Supine AAROM shoulder flexion with 2# bar 2x12  Supine AAROM ER with 2# bar 2x12  Supine PT assisted abduction AAROM 1x10 with 10s hold in max stretch  Bicep isometrics in 3 directions ((hand in supine, neutral hand, prone hand) 1x10 with 3s hold   Self-Care:  Discussed MRI results, potential shoulder follow-up, POC   11/09/2023 TherEx: UBE with bilat UE level 2.5 for 4 min fwd/4 min back Supine with foam roller on spine 2 min hold  Bicep isometrics in 3 directions for 1x10 with 3s hold (hand in supine, neutral hand, prone hand)  Doorway bicep stretch with arm straight 3x30s  UE ranger into shoulder flexion 2x10   Manual:   TTP with bicep, delt, inferior glide into abduction, and posterior glide   11/02/2023 TherEx:  UBE with bilat UE level 2 , 4 min fwd/4 min back Seated bicep isometric with palm up 2x10 with 3s hold  Seated bicep isometric with neutral hand 2x10 with 3s hold  Seated bicep isometric with pronated hand 2x10 with 3s hold   Neuro Re-Ed:  Standing rows with blue TB 2x15  Straight arm pulldowns with green TB 2x15  Attempted ER with scapular retraction using red TB, but increased pain; discontinued     PATIENT EDUCATION:  Education details: HEP, POC Person educated: Patient Education method: Explanation, Demonstration, Tactile cues, Verbal cues, and Handouts Education comprehension: verbalized understanding and returned demonstration  HOME EXERCISE PROGRAM: Access Code: [email or web address] URL: [email or web address] Date: 10/25/2023 Prepared by: Susannah Daring  Exercises - Seated Cervical Retraction  - 1 x daily - 7 x weekly - 2 sets - 10 reps - 3s hold - Seated Scapular Retraction  - 1 x daily - 7 x weekly - 2 sets - 10 reps - 3s hold - Supine Shoulder Flexion with Dowel  - 1 x daily - 7 x weekly - 2 sets - 10 reps - 3s hold - Supine Shoulder Abduction AAROM with Dowel  - 1 x daily - 7 x weekly -  2 sets - 10 reps - 3s hold  ASSESSMENT:  CLINICAL IMPRESSION: Patient arrived to session following injection in Rt shoulder and noting slight pain/discomfort. Patient tolerated all activities this date with no increase in pain during increase in challenge. Patient will continue to benefit from skilled PT.  OBJECTIVE IMPAIRMENTS: Abnormal gait, decreased activity tolerance, decreased balance, decreased coordination, decreased mobility, difficulty walking, decreased ROM, decreased strength, impaired UE functional use, improper body mechanics, postural dysfunction, prosthetic dependency , and pain.   ACTIVITY LIMITATIONS: carrying, lifting, sleeping, bed mobility, and reach over  head  PARTICIPATION LIMITATIONS: cleaning, community activity, and church  PERSONAL FACTORS: Past/current experiences and 3+ comorbidities: hypothyroidism, HTN, HLD, GERD, A-fib (s/p ablation), DM2, arthritis, PVD, CAD are also affecting patient's functional outcome.   REHAB POTENTIAL: Good  CLINICAL DECISION MAKING: Evolving/moderate complexity  EVALUATION COMPLEXITY: Moderate   GOALS: Goals reviewed with patient? Yes  SHORT TERM GOALS: Target date: 11/08/2023 Patient will show compliance with initial HEP. Baseline: Goal status: GOAL MET, 11/09/2023  2.  Patient will report pain levels no greater than 6/10 in order to show improved overall quality of life. Baseline:  Goal status: goal ongoing, 11/09/2023    LONG TERM GOALS: Target date: 11/22/2023  Patient will be independent with final HEP in order to maintain and progress upon functional gains made within PT. Baseline:  Goal status: INITIAL  2.  Patient will report pain levels no greater than 3/10 in order to show improved overall quality of life. Baseline:  Goal status: INITIAL  3.  Patient will increase PSFS to at least 6.33 in order to show a significant improvement in subjective disability rating. Baseline:  Goal status: INITIAL  4.  Patient will increase Rt shoulder flexion ROM to at least 110deg in order to improve functional mobility. Baseline:  Goal status: INITIAL  5.  Patient will increase Rt shoulder abduction ROM to at least 95deg in order to improve functional mobility. Baseline:  Goal status: INITIAL     PLAN:  PT FREQUENCY: 1-2x/week  PT DURATION: 4 weeks  PLANNED INTERVENTIONS: [id number]- PT Re-evaluation, 97750- Physical Performance Testing, 97110-Therapeutic exercises, 97530- Therapeutic activity, [id number]- Neuromuscular re-education, 97535- Self Care, [id number]- Manual therapy, [id number]- Gait training, [id number]- Prosthetic Initial , [id number]- Orthotic/Prosthetic subsequent, [phone]- Canalith repositioning,  [id number]- Aquatic Therapy, H9716- Electrical stimulation (unattended), [id number]- Electrical stimulation (manual), [id number]- Vasopneumatic device, [id number]- Ultrasound, [id number]- Traction (mechanical), [id number]- Ionotophoresis 4mg /ml Dexamethasone , 97597- Wound care (first 20 sq cm), 97598- Wound care (each additional 20 sq cm), 20560 (1-2 muscles), 20561 (3+ muscles)- Dry Needling, Patient/Family education, Balance training, Stair training, Taping, Joint mobilization, Joint manipulation, Spinal manipulation, Spinal mobilization, Scar mobilization, Compression bandaging, Vestibular training, DME instructions, Cryotherapy, and Moist heat  PLAN FOR NEXT SESSION:    PROGRESS NOTE/RECERT, assess Lt hip if necessary, generalized mobility and strengthening for Rt shoulder, postural strengthening   Susannah Daring, PT, DPT 11/19/23 3:27 PM

## 2023-11-19 ENCOUNTER — Ambulatory Visit: Payer: Self-pay

## 2023-11-19 ENCOUNTER — Ambulatory Visit: Admitting: Surgical

## 2023-11-19 ENCOUNTER — Ambulatory Visit

## 2023-11-19 DIAGNOSIS — M25552 Pain in left hip: Secondary | ICD-10-CM | POA: Diagnosis not present

## 2023-11-19 DIAGNOSIS — M7521 Bicipital tendinitis, right shoulder: Secondary | ICD-10-CM | POA: Diagnosis not present

## 2023-11-19 DIAGNOSIS — G8929 Other chronic pain: Secondary | ICD-10-CM

## 2023-11-19 DIAGNOSIS — M75101 Unspecified rotator cuff tear or rupture of right shoulder, not specified as traumatic: Secondary | ICD-10-CM

## 2023-11-19 DIAGNOSIS — M6281 Muscle weakness (generalized): Secondary | ICD-10-CM | POA: Diagnosis not present

## 2023-11-19 DIAGNOSIS — M25511 Pain in right shoulder: Secondary | ICD-10-CM

## 2023-11-19 DIAGNOSIS — R2689 Other abnormalities of gait and mobility: Secondary | ICD-10-CM | POA: Diagnosis not present

## 2023-11-19 DIAGNOSIS — M25611 Stiffness of right shoulder, not elsewhere classified: Secondary | ICD-10-CM | POA: Diagnosis not present

## 2023-11-20 NOTE — Therapy (Signed)
 OUTPATIENT PHYSICAL THERAPY UPPER EXTREMITY TREATMENT/ PROGRESS NOTE/ RECERTIFICATION   Patient Name: [name] MRN: [id number] DOB:06-Aug-1950, 73 y.o., male Today's Date: 11/21/2023   Progress Note Reporting Period 10/25/2023 to 11/21/2023  See note below for Objective Data and Assessment of Progress/Goals.      END OF SESSION:  PT End of Session - 11/21/23 1107     Visit Number 7    Number of Visits 15    Date for Recertification  12/19/23    Authorization Type MEDICARE AND BCBS    Progress Note Due on Visit 10    PT Start Time 1104    PT Stop Time 1144    PT Time Calculation (min) 40 min    Activity Tolerance Patient tolerated treatment well    Behavior During Therapy WFL for tasks assessed/performed            Past Medical History:  Diagnosis Date   Anemia    low iron   Arthritis    Coronary artery disease 2006   a. remote stenting to ramus, prox LAD x2.   Dental crowns present    Diabetes mellitus    First degree AV block    GERD (gastroesophageal reflux disease)    Heart attack (HCC) 06/2004   Heart murmur    aortic   Hx MRSA infection 02/2006   Hyperlipidemia    Hypertension    hx. of - has not been on med. since losing wt. after gastric bypass   Hypothyroidism    Morbid obesity (HCC)    Mucoid cyst of joint 09/2011   left thumb   Sleep apnea    sleep study 03/29/2011; no CPAP use, lost >130lbs   Trifascicular block    Past Surgical History:  Procedure Laterality Date   AMPUTATION Left 04/19/2016   Procedure: Left Foot 4th Toe Amputation vs. Transmetatarsal;  Surgeon: Jerona Harden GAILS, MD;  Location: [facility] OR;  Service: Orthopedics;  Laterality: Left;   ATRIAL FIBRILLATION ABLATION N/A 10/11/2023   Procedure: ATRIAL FIBRILLATION ABLATION;  Surgeon: Nancey Eulas BRAVO, MD;  Location: [facility];  Service: Cardiovascular;  Laterality: N/A;   BIOPSY  04/07/2019   Procedure: BIOPSY;  Surgeon: Elicia Claw, MD;  Location: [facility];  Service:  Gastroenterology;;   CATARACT EXTRACTION  02/2010; 03/2010   COLONOSCOPY WITH PROPOFOL  N/A 09/14/2014   Procedure: COLONOSCOPY WITH PROPOFOL ;  Surgeon: Gladis MARLA Louder, MD;  Location: [facility];  Service: Endoscopy;  Laterality: N/A;   COLONOSCOPY WITH PROPOFOL  N/A 04/07/2019   Procedure: COLONOSCOPY WITH PROPOFOL ;  Surgeon: Elicia Claw, MD;  Location: [facility];  Service: Gastroenterology;  Laterality: N/A;   CORONARY ANGIOPLASTY WITH STENT PLACEMENT  07/12/2004; 08/03/2004   total of 3 stents   CORONARY STENT INTERVENTION N/A 08/23/2022   Procedure: CORONARY STENT INTERVENTION;  Surgeon: Jordan, Peter M, MD;  Location: [facility] INVASIVE CV LAB;  Service: Cardiovascular;  Laterality: N/A;   I & D EXTREMITY Right 06/04/2014   Procedure: IRRIGATION AND DEBRIDEMENT EXTREMITY;  Surgeon: Lonni CINDERELLA Poli, MD;  Location: [facility] OR;  Service: Orthopedics;  Laterality: Right;   LEFT HEART CATH AND CORONARY ANGIOGRAPHY N/A 08/23/2022   Procedure: LEFT HEART CATH AND CORONARY ANGIOGRAPHY;  Surgeon: Jordan, Peter M, MD;  Location: [facility] INVASIVE CV LAB;  Service: Cardiovascular;  Laterality: N/A;   MASS EXCISION  10/04/2011   Procedure: EXCISION MASS;  Surgeon: Arley JONELLE Curia, MD;  Location: [facility];  Service: Orthopedics;  Laterality: Left;  excision  cyst debridment ip joint of left thumb   PROSTATECTOMY     right below knee amputation  2018   ROUX-EN-Y GASTRIC BYPASS  10/10/2010   laparoscopic   SHOULDER ARTHROSCOPY WITH ROTATOR CUFF REPAIR AND SUBACROMIAL DECOMPRESSION Right 03/23/2020   Procedure: RIGHT SHOULDER ARTHROSCOPY WITH DEBRIDEMENT AND  ROTATOR CUFF REPAIR;  Surgeon: Vernetta Lonni GRADE, MD;  Location: [facility] OR;  Service: Orthopedics;  Laterality: Right;   TOE AMPUTATION  02/23/2006   left foot second ray amputation   TOTAL HIP ARTHROPLASTY Right 03/29/2020   Procedure: TOTAL HIP ARTHROPLASTY POSTERIOR APPROACH;  Surgeon: Jerri Kay HERO, MD;  Location: [facility] OR;  Service: Orthopedics;   Laterality: Right;   TRANSMETATARSAL AMPUTATION Left    TRIGGER FINGER RELEASE Right 09/16/2019   Procedure: RELEASE TRIGGER FINGER/A-1 PULLEY;  Surgeon: Murrell Kuba, MD;  Location: [facility];  Service: Orthopedics;  Laterality: Right;  IV REGIONAL FOREARM BLOCK   Patient Active Problem List   Diagnosis Date Noted   Atrial flutter (HCC) 06/27/2023   Paroxysmal atrial fibrillation with RVR (HCC) 08/23/2022   Ascending aortic aneurysm 08/23/2022   Bradycardia 08/22/2022   NSTEMI (non-ST elevated myocardial infarction) (HCC) 08/22/2022   Atrial fibrillation with RVR (HCC) 08/22/2022   Hypotension 01/07/2021   S/P arthroscopy of right shoulder 06/02/2020   Closed fracture of neck of right femur (HCC)    Hip fracture (HCC) 03/27/2020   Nontraumatic complete tear of right rotator cuff 02/26/2020   S/P transmetatarsal amputation of foot, left (HCC) 04/25/2016   Chronic osteomyelitis of toe, left (HCC)    Chronic diastolic CHF (congestive heart failure) (HCC) 09/25/2015   Aortic stenosis 08/04/2014   Diabetic foot infection (HCC) 06/03/2014   Peripheral vascular disease 06/03/2014   Coronary artery disease involving native coronary artery of native heart without angina pectoris 07/29/2013   Trifascicular block 07/29/2013   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 07/26/2011   Hypothyroidism 01/22/2007   Type 2 diabetes mellitus with vascular disease (HCC) 01/22/2007   HYPERLIPIDEMIA, MIXED 01/22/2007   MYOCARDIAL INFARCTION, HX OF 01/22/2007   ANEMIA, IRON DEFICIENCY, HX OF 01/22/2007    PCP: Trula SHAUNNA Brim, MD   REFERRING PROVIDER: Cordella Glendia Hutchinson, MD   REFERRING DIAG: [phone] (ICD-10-CM) - Chronic right shoulder pain   THERAPY DIAG:  Chronic right shoulder pain  Stiffness of right shoulder, not elsewhere classified  Other abnormalities of gait and mobility  Muscle weakness (generalized)  Pain in left hip  Rationale for Evaluation and Treatment:  Rehabilitation  ONSET DATE: spring 2025  SUBJECTIVE:   SUBJECTIVE STATEMENT: Patient reports improved symptoms following injection.   PERTINENT HISTORY: Patient has undergone Rt TTA, Rt shoulder arthroscopy, and Rt THA. Currently having Rt shoulder pain after 1 fall in the spring where he landed on Rt shoulder. Patient is now experiencing pain in Rt shoulder with lifting and sleeping. Patient also reports Lt hip pain that is currently being treated with injection, tramadol , and prednisone.    See PMH or personal factors for comorbidities  PAIN:  Are you having pain? Yes: NPRS scale: 2/10 Pain location: Rt shoulder, bicep with reaching  Pain description: sharp Aggravating factors: sleeping, lifting, reaching Relieving factors: rest, injection  PRECAUTIONS: Fall and Other: cardiac  RED FLAGS: None   WEIGHT BEARING RESTRICTIONS: No  FALLS:  Has patient fallen in last 6 months? Yes. Number of falls 1; thought he was locked into the prosthesis and was not    LIVING ENVIRONMENT: Lives with: lives  with their spouse Lives in: House/apartment Stairs: Yes: Internal: 12 steps; rail/wall and External: 1 to get in the house from garage, 3 steps to patio steps; one rail on front, double to patio  Has following equipment at home: unable to assess on eval  OCCUPATION: retired, IT TRAINER  PLOF: Independent and Rt TTA prosthesis   PATIENT GOALS: get rid of the pain to lift and move things around  NEXT MD VISIT: 10/26/2023 for injection in Lt hip   OBJECTIVE:  Note: Objective measures were completed at Evaluation unless otherwise noted.  DIAGNOSTIC FINDINGS:  IMPRESSION: 1. Mild spurring of the left acetabulum and femoral head. 2. Mild proximal hamstring tendinopathy. 3. Trace nonspecific edema medially along the hip adductor musculature. 4. Contralateral (right) total hip prosthesis.  PATIENT SURVEYS:  PSFS: THE PATIENT SPECIFIC FUNCTIONAL SCALE  Place score of 0-10 (0 = unable to  perform activity and 10 = able to perform activity at the same level as before injury or problem)  Activity Date: 10/25/2023 11/09/2023 11/21/2023   Raising right arm above shoulder  5 2 8   2. Carrying load with right arm  4 7 9   3. Sleeping on right side  4 7 9   4.      Total Score 4.33 5.33 8.66    Total Score = Sum of activity scores/number of activities  Minimally Detectable Change: 3 points (for single activity); 2 points (for average score)  [facility] (nd). The Patient Specific Functional Scale . Retrieved from [email or web address]   COGNITION: Overall cognitive status: Within functional limits for tasks assessed     SENSATION: Not formally assessed on eval  EDEMA:    MUSCLE LENGTH: Not formally assessed on eval secondary to focus on UE  POSTURE: rounded shoulders, forward head, and increased thoracic kyphosis  PALPATION: Not formally assessed on eval  LOWER EXTREMITY ROM: not assessed on eval secondary to Rt UE focus   ROM Right Eval 10/25/2023 Left Eval 10/25/2023  Hip flexion    Hip extension    Hip abduction    Hip adduction    Hip internal rotation    Hip external rotation    Knee flexion    Knee extension    Ankle dorsiflexion    Ankle plantarflexion    Ankle inversion    Ankle eversion     (Blank rows = not tested)  LOWER EXTREMITY MMT: not assessed on eval secondary to Rt UE focus   MMT Right eval Left eval  Hip flexion    Hip extension    Hip abduction    Hip adduction    Hip internal rotation    Hip external rotation    Knee flexion    Knee extension    Ankle dorsiflexion    Ankle plantarflexion    Ankle inversion    Ankle eversion     (Blank rows = not tested)  LOWER EXTREMITY SPECIAL TESTS:  Not formally assessed on eval   Upper Extremity ROM: 10/25/2023 SEATED , AROM Shoulder flexion: Rt: 80deg Lt: WFL Shoulder abduction: Rt: 72deg Lt: WFL Shoulder ER: Rt:  L3 Lt: T3 Shoulder IR: Rt: T1 Lt: T10  11/21/2023 Seated, AROM Rt shoulder flexion: 130deg Rt shoulder abduction: 150deg   Upper Extremity MMT:  SEATED ; Rt within available ROM  Shoulder flexion: Rt: 4+/5 Lt: 4+/5 Shoulder abduction: Rt:4+/5 Lt: 4+/5 Shoulder ER: Rt: 3+/5 Lt: 3+/5 Shoulder IR: Rt:3+/5  Lt: 4-/5  UE special tests: 10/25/2023 Full can: negative Empty can: postivie,  tension and painful   FUNCTIONAL TESTS:  Not formally assessed on eval  GAIT: Distance walked: not formally assessed  Assistive device utilized: Rt TTA prosthesis  Level of assistance: supervision Comments: Rt TTA prosthesis appears to be short                                                                                                                                TREATMENT DATE:  11/21/2023 TherAct:  Pulleys 4 min shoulder flexion, 4 min scaption Assessed ROM Standing bicep curls with focus on slow eccentrics for bicep loading 1x12 each direction with 4# (hands in supine, neutral, prone) Supine horizontal abduction with yellow TB 2x10 with 2s hold   Neuro Re-Ed:  Standing Is, Ys, Ts at wall 1x10 each for postural strength  Standing rows with cable machine 2x15 with 25# Supine with half foam roller on spine to stretch chest and biceps 1x3 min hold   11/19/2023 TherEx:  Pulleys 3 min shoulder flexion, 3 min scaption Bicep curls with focus on slow eccentrics for bicep loading 1x12 each direction (hands in supine, neutral, prone) Supine AAROM shoulder abduction with patient performing eccentric AROM 1x8 with 10s hold in max stretch  Patient transitioned to AROM concentric and eccentric for 1x2 with 10s hold in max stretch  PT discussed patient on transitioning from isometrics to eccentric control to challenge biceps without overworking secondary to potential tendonitis   Neuro Re-Ed:  Standing rows with cable machine 3x12 with 25# Supine with half foam roller on spine to stretch chest  and biceps 1x2 min hold   11/12/2023 TherEx:  Pulleys 3 min shoulder flexion, 3 min scaption  Supine AAROM shoulder flexion with 2# bar 2x12  Supine AAROM ER with 2# bar 2x12  Supine PT assisted abduction AAROM 1x10 with 10s hold in max stretch  Bicep isometrics in 3 directions ((hand in supine, neutral hand, prone hand) 1x10 with 3s hold   Self-Care:  Discussed MRI results, potential shoulder follow-up, POC   11/09/2023 TherEx: UBE with bilat UE level 2.5 for 4 min fwd/4 min back Supine with foam roller on spine 2 min hold  Bicep isometrics in 3 directions for 1x10 with 3s hold (hand in supine, neutral hand, prone hand)  Doorway bicep stretch with arm straight 3x30s  UE ranger into shoulder flexion 2x10   Manual:  TTP with bicep, delt, inferior glide into abduction, and posterior glide     PATIENT EDUCATION:  Education details: HEP, POC Person educated: Patient Education method: Explanation, Demonstration, Tactile cues, Verbal cues, and Handouts Education comprehension: verbalized understanding and returned demonstration  HOME EXERCISE PROGRAM: Access Code: [email or web address] URL: [email or web address] Date: 10/25/2023 Prepared by: Susannah Daring  Exercises - Seated Cervical Retraction  - 1 x daily - 7 x weekly - 2 sets - 10 reps - 3s hold - Seated Scapular Retraction  - 1 x daily - 7 x weekly - 2 sets - 10 reps -  3s hold - Supine Shoulder Flexion with Dowel  - 1 x daily - 7 x weekly - 2 sets - 10 reps - 3s hold - Supine Shoulder Abduction AAROM with Dowel  - 1 x daily - 7 x weekly - 2 sets - 10 reps - 3s hold  ASSESSMENT:  CLINICAL IMPRESSION: Patient arrived to session noting improvement in symptoms following injection in Rt shoulder. Patient is making great improvements in ROM, though continues to have intermittent pain. Patient will continue to benefit from skilled PT.  OBJECTIVE IMPAIRMENTS: Abnormal gait, decreased activity tolerance, decreased balance,  decreased coordination, decreased mobility, difficulty walking, decreased ROM, decreased strength, impaired UE functional use, improper body mechanics, postural dysfunction, prosthetic dependency , and pain.   ACTIVITY LIMITATIONS: carrying, lifting, sleeping, bed mobility, and reach over head  PARTICIPATION LIMITATIONS: cleaning, community activity, and church  PERSONAL FACTORS: Past/current experiences and 3+ comorbidities: hypothyroidism, HTN, HLD, GERD, A-fib (s/p ablation), DM2, arthritis, PVD, CAD are also affecting patient's functional outcome.   REHAB POTENTIAL: Good  CLINICAL DECISION MAKING: Evolving/moderate complexity  EVALUATION COMPLEXITY: Moderate   GOALS: Goals reviewed with patient? Yes  SHORT TERM GOALS: Target date: 11/08/2023 Patient will show compliance with initial HEP. Baseline: Goal status: GOAL MET, 11/09/2023  2.  Patient will report pain levels no greater than 6/10 in order to show improved overall quality of life. Baseline:  Goal status: GOAL MET, 11/21/2023    LONG TERM GOALS: Target date: 12/19/2023  Patient will be independent with final HEP in order to maintain and progress upon functional gains made within PT. Baseline:  Goal status: goal ongoing, 11/21/2023  2.  Patient will report pain levels no greater than 3/10 in order to show improved overall quality of life. Baseline:  Goal status: goal ongoing, 11/21/2023  3.  Patient will increase PSFS to at least 6.33 in order to show a significant improvement in subjective disability rating. Baseline:  Goal status: GOAL MET, 11/21/2023  4.  Patient will increase Rt shoulder flexion ROM to at least 110deg in order to improve functional mobility. Baseline:  Goal status: GOAL MET, 11/21/2023  5.  Patient will increase Rt shoulder abduction ROM to at least 95deg in order to improve functional mobility. Baseline:  Goal status: GOAL MET, 11/21/2023     PLAN:  PT FREQUENCY: 1-2x/week  PT  DURATION: 4 weeks  PLANNED INTERVENTIONS: [id number]- PT Re-evaluation, 97750- Physical Performance Testing, 97110-Therapeutic exercises, 97530- Therapeutic activity, [id number]- Neuromuscular re-education, 97535- Self Care, [id number]- Manual therapy, [id number]- Gait training, [phone]- Prosthetic Initial , [id number]- Orthotic/Prosthetic subsequent, [id number]- Canalith repositioning, [id number]- Aquatic Therapy, H9716- Electrical stimulation (unattended), [phone]- Electrical stimulation (manual), [id number]- Vasopneumatic device, [id number]- Ultrasound, [id number]- Traction (mechanical), [id number]- Ionotophoresis 4mg /ml Dexamethasone , 97597- Wound care (first 20 sq cm), 97598- Wound care (each additional 20 sq cm), 20560 (1-2 muscles), 20561 (3+ muscles)- Dry Needling, Patient/Family education, Balance training, Stair training, Taping, Joint mobilization, Joint manipulation, Spinal manipulation, Spinal mobilization, Scar mobilization, Compression bandaging, Vestibular training, DME instructions, Cryotherapy, and Moist heat  PLAN FOR NEXT SESSION:  generalized mobility and strengthening for Rt shoulder, postural strengthening   Susannah Daring, PT, DPT 11/21/23 12:54 PM

## 2023-11-21 ENCOUNTER — Ambulatory Visit (INDEPENDENT_AMBULATORY_CARE_PROVIDER_SITE_OTHER)

## 2023-11-21 DIAGNOSIS — M25552 Pain in left hip: Secondary | ICD-10-CM

## 2023-11-21 DIAGNOSIS — M25511 Pain in right shoulder: Secondary | ICD-10-CM | POA: Diagnosis not present

## 2023-11-21 DIAGNOSIS — M6281 Muscle weakness (generalized): Secondary | ICD-10-CM

## 2023-11-21 DIAGNOSIS — G8929 Other chronic pain: Secondary | ICD-10-CM

## 2023-11-21 DIAGNOSIS — R2689 Other abnormalities of gait and mobility: Secondary | ICD-10-CM

## 2023-11-21 DIAGNOSIS — M25611 Stiffness of right shoulder, not elsewhere classified: Secondary | ICD-10-CM

## 2023-11-22 DIAGNOSIS — D2261 Melanocytic nevi of right upper limb, including shoulder: Secondary | ICD-10-CM | POA: Diagnosis not present

## 2023-11-22 DIAGNOSIS — L821 Other seborrheic keratosis: Secondary | ICD-10-CM | POA: Diagnosis not present

## 2023-11-22 DIAGNOSIS — D225 Melanocytic nevi of trunk: Secondary | ICD-10-CM | POA: Diagnosis not present

## 2023-11-22 DIAGNOSIS — D692 Other nonthrombocytopenic purpura: Secondary | ICD-10-CM | POA: Diagnosis not present

## 2023-11-22 DIAGNOSIS — D2372 Other benign neoplasm of skin of left lower limb, including hip: Secondary | ICD-10-CM | POA: Diagnosis not present

## 2023-11-22 DIAGNOSIS — D1801 Hemangioma of skin and subcutaneous tissue: Secondary | ICD-10-CM | POA: Diagnosis not present

## 2023-11-22 DIAGNOSIS — D224 Melanocytic nevi of scalp and neck: Secondary | ICD-10-CM | POA: Diagnosis not present

## 2023-11-22 DIAGNOSIS — D1721 Benign lipomatous neoplasm of skin and subcutaneous tissue of right arm: Secondary | ICD-10-CM | POA: Diagnosis not present

## 2023-11-22 DIAGNOSIS — L814 Other melanin hyperpigmentation: Secondary | ICD-10-CM | POA: Diagnosis not present

## 2023-11-23 NOTE — Therapy (Signed)
 OUTPATIENT PHYSICAL THERAPY UPPER EXTREMITY TREATMENT   Patient Name: [name] MRN: [id number] DOB:[birth year], 73 y.o., male Today's Date: 11/26/2023     END OF SESSION:  PT End of Session - 11/26/23 1107     Visit Number 8    Number of Visits 15    Date for Recertification  12/19/23    Authorization Type MEDICARE AND BCBS    Progress Note Due on Visit 10    PT Start Time 1103    PT Stop Time 1141    PT Time Calculation (min) 38 min    Activity Tolerance Patient tolerated treatment well    Behavior During Therapy WFL for tasks assessed/performed             Past Medical History:  Diagnosis Date   Anemia    low iron   Arthritis    Coronary artery disease 2006   a. remote stenting to ramus, prox LAD x2.   Dental crowns present    Diabetes mellitus    First degree AV block    GERD (gastroesophageal reflux disease)    Heart attack (HCC) 06/2004   Heart murmur    aortic   Hx MRSA infection 02/2006   Hyperlipidemia    Hypertension    hx. of - has not been on med. since losing wt. after gastric bypass   Hypothyroidism    Morbid obesity (HCC)    Mucoid cyst of joint 09/2011   left thumb   Sleep apnea    sleep study 03/29/2011; no CPAP use, lost >130lbs   Trifascicular block    Past Surgical History:  Procedure Laterality Date   AMPUTATION Left 04/19/2016   Procedure: Left Foot 4th Toe Amputation vs. Transmetatarsal;  Surgeon: Jerona Harden GAILS, MD;  Location: [facility] OR;  Service: Orthopedics;  Laterality: Left;   ATRIAL FIBRILLATION ABLATION N/A 10/11/2023   Procedure: ATRIAL FIBRILLATION ABLATION;  Surgeon: Nancey Eulas BRAVO, MD;  Location: [facility];  Service: Cardiovascular;  Laterality: N/A;   BIOPSY  04/07/2019   Procedure: BIOPSY;  Surgeon: Elicia Claw, MD;  Location: [facility];  Service: Gastroenterology;;   CATARACT EXTRACTION  02/2010; 03/2010   COLONOSCOPY WITH PROPOFOL  N/A 09/14/2014   Procedure: COLONOSCOPY WITH PROPOFOL ;  Surgeon: Gladis MARLA Louder, MD;  Location: [facility];  Service: Endoscopy;  Laterality: N/A;   COLONOSCOPY WITH PROPOFOL  N/A 04/07/2019   Procedure: COLONOSCOPY WITH PROPOFOL ;  Surgeon: Elicia Claw, MD;  Location: [facility];  Service: Gastroenterology;  Laterality: N/A;   CORONARY ANGIOPLASTY WITH STENT PLACEMENT  07/12/2004; 08/03/2004   total of 3 stents   CORONARY STENT INTERVENTION N/A 08/23/2022   Procedure: CORONARY STENT INTERVENTION;  Surgeon: Jordan, Peter M, MD;  Location: [facility] INVASIVE CV LAB;  Service: Cardiovascular;  Laterality: N/A;   I & D EXTREMITY Right 06/04/2014   Procedure: IRRIGATION AND DEBRIDEMENT EXTREMITY;  Surgeon: Lonni CINDERELLA Poli, MD;  Location: [facility] OR;  Service: Orthopedics;  Laterality: Right;   LEFT HEART CATH AND CORONARY ANGIOGRAPHY N/A 08/23/2022   Procedure: LEFT HEART CATH AND CORONARY ANGIOGRAPHY;  Surgeon: Jordan, Peter M, MD;  Location: [facility] INVASIVE CV LAB;  Service: Cardiovascular;  Laterality: N/A;   MASS EXCISION  10/04/2011   Procedure: EXCISION MASS;  Surgeon: Arley JONELLE Curia, MD;  Location: [facility];  Service: Orthopedics;  Laterality: Left;  excision cyst debridment ip joint of left thumb   PROSTATECTOMY     right below knee amputation  2018   ROUX-EN-Y  GASTRIC BYPASS  10/10/2010   laparoscopic   SHOULDER ARTHROSCOPY WITH ROTATOR CUFF REPAIR AND SUBACROMIAL DECOMPRESSION Right 03/23/2020   Procedure: RIGHT SHOULDER ARTHROSCOPY WITH DEBRIDEMENT AND  ROTATOR CUFF REPAIR;  Surgeon: Vernetta Lonni GRADE, MD;  Location: [facility] OR;  Service: Orthopedics;  Laterality: Right;   TOE AMPUTATION  02/23/2006   left foot second ray amputation   TOTAL HIP ARTHROPLASTY Right 03/29/2020   Procedure: TOTAL HIP ARTHROPLASTY POSTERIOR APPROACH;  Surgeon: Jerri Kay HERO, MD;  Location: [facility] OR;  Service: Orthopedics;  Laterality: Right;   TRANSMETATARSAL AMPUTATION Left    TRIGGER FINGER RELEASE Right 09/16/2019   Procedure: RELEASE TRIGGER FINGER/A-1 PULLEY;  Surgeon:  Murrell Kuba, MD;  Location: [facility];  Service: Orthopedics;  Laterality: Right;  IV REGIONAL FOREARM BLOCK   Patient Active Problem List   Diagnosis Date Noted   Atrial flutter (HCC) 06/27/2023   Paroxysmal atrial fibrillation with RVR (HCC) 08/23/2022   Ascending aortic aneurysm 08/23/2022   Bradycardia 08/22/2022   NSTEMI (non-ST elevated myocardial infarction) (HCC) 08/22/2022   Atrial fibrillation with RVR (HCC) 08/22/2022   Hypotension 01/07/2021   S/P arthroscopy of right shoulder 06/02/2020   Closed fracture of neck of right femur (HCC)    Hip fracture (HCC) 03/27/2020   Nontraumatic complete tear of right rotator cuff 02/26/2020   S/P transmetatarsal amputation of foot, left (HCC) 04/25/2016   Chronic osteomyelitis of toe, left (HCC)    Chronic diastolic CHF (congestive heart failure) (HCC) 09/25/2015   Aortic stenosis 08/04/2014   Diabetic foot infection (HCC) 06/03/2014   Peripheral vascular disease 06/03/2014   Coronary artery disease involving native coronary artery of native heart without angina pectoris 07/29/2013   Trifascicular block 07/29/2013   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 07/26/2011   Hypothyroidism 01/22/2007   Type 2 diabetes mellitus with vascular disease (HCC) 01/22/2007   HYPERLIPIDEMIA, MIXED 01/22/2007   MYOCARDIAL INFARCTION, HX OF 01/22/2007   ANEMIA, IRON DEFICIENCY, HX OF 01/22/2007    PCP: Trula SHAUNNA Brim, MD   REFERRING PROVIDER: Cordella Glendia Hutchinson, MD   REFERRING DIAG: [phone] (ICD-10-CM) - Chronic right shoulder pain   THERAPY DIAG:  Chronic right shoulder pain  Stiffness of right shoulder, not elsewhere classified  Other abnormalities of gait and mobility  Muscle weakness (generalized)  Pain in left hip  Rationale for Evaluation and Treatment: Rehabilitation  ONSET DATE: spring 2025  SUBJECTIVE:   SUBJECTIVE STATEMENT: Patient reports that occasionally he will have a twinge, but he isn't  consistently hurting.    PERTINENT HISTORY: Patient has undergone Rt TTA, Rt shoulder arthroscopy, and Rt THA. Currently having Rt shoulder pain after 1 fall in the spring where he landed on Rt shoulder. Patient is now experiencing pain in Rt shoulder with lifting and sleeping. Patient also reports Lt hip pain that is currently being treated with injection, tramadol , and prednisone.    See PMH or personal factors for comorbidities  PAIN:  Are you having pain? Yes: NPRS scale: 2/10 Pain location: Rt shoulder, bicep with reaching  Pain description: sharp Aggravating factors: sleeping, lifting, reaching Relieving factors: rest, injection  PRECAUTIONS: Fall and Other: cardiac  RED FLAGS: None   WEIGHT BEARING RESTRICTIONS: No  FALLS:  Has patient fallen in last 6 months? Yes. Number of falls 1; thought he was locked into the prosthesis and was not    LIVING ENVIRONMENT: Lives with: lives with their spouse Lives in: House/apartment Stairs: Yes: Internal: 12 steps; rail/wall and External:  1 to get in the house from garage, 3 steps to patio steps; one rail on front, double to patio  Has following equipment at home: unable to assess on eval  OCCUPATION: retired, IT TRAINER  PLOF: Independent and Rt TTA prosthesis   PATIENT GOALS: get rid of the pain to lift and move things around  NEXT MD VISIT: 10/26/2023 for injection in Lt hip   OBJECTIVE:  Note: Objective measures were completed at Evaluation unless otherwise noted.  DIAGNOSTIC FINDINGS:  IMPRESSION: 1. Mild spurring of the left acetabulum and femoral head. 2. Mild proximal hamstring tendinopathy. 3. Trace nonspecific edema medially along the hip adductor musculature. 4. Contralateral (right) total hip prosthesis.  PATIENT SURVEYS:  PSFS: THE PATIENT SPECIFIC FUNCTIONAL SCALE  Place score of 0-10 (0 = unable to perform activity and 10 = able to perform activity at the same level as before injury or problem)  Activity Date:  10/25/2023 11/09/2023 11/21/2023   Raising right arm above shoulder  5 2 8   2. Carrying load with right arm  4 7 9   3. Sleeping on right side  4 7 9   4.      Total Score 4.33 5.33 8.66    Total Score = Sum of activity scores/number of activities  Minimally Detectable Change: 3 points (for single activity); 2 points (for average score)  [facility] (nd). The Patient Specific Functional Scale . Retrieved from [email or web address]   COGNITION: Overall cognitive status: Within functional limits for tasks assessed     SENSATION: Not formally assessed on eval  EDEMA:    MUSCLE LENGTH: Not formally assessed on eval secondary to focus on UE  POSTURE: rounded shoulders, forward head, and increased thoracic kyphosis  PALPATION: Not formally assessed on eval  LOWER EXTREMITY ROM: not assessed on eval secondary to Rt UE focus   ROM Right Eval 10/25/2023 Left Eval 10/25/2023  Hip flexion    Hip extension    Hip abduction    Hip adduction    Hip internal rotation    Hip external rotation    Knee flexion    Knee extension    Ankle dorsiflexion    Ankle plantarflexion    Ankle inversion    Ankle eversion     (Blank rows = not tested)  LOWER EXTREMITY MMT: not assessed on eval secondary to Rt UE focus   MMT Right eval Left eval  Hip flexion    Hip extension    Hip abduction    Hip adduction    Hip internal rotation    Hip external rotation    Knee flexion    Knee extension    Ankle dorsiflexion    Ankle plantarflexion    Ankle inversion    Ankle eversion     (Blank rows = not tested)  LOWER EXTREMITY SPECIAL TESTS:  Not formally assessed on eval   Upper Extremity ROM: 10/25/2023 SEATED , AROM Shoulder flexion: Rt: 80deg Lt: WFL Shoulder abduction: Rt: 72deg Lt: WFL Shoulder ER: Rt: L3 Lt: T3 Shoulder IR: Rt: T1 Lt: T10  11/21/2023 Seated, AROM Rt shoulder flexion: 130deg Rt shoulder  abduction: 150deg   Upper Extremity MMT:  SEATED ; Rt within available ROM  Shoulder flexion: Rt: 4+/5 Lt: 4+/5 Shoulder abduction: Rt:4+/5 Lt: 4+/5 Shoulder ER: Rt: 3+/5 Lt: 3+/5 Shoulder IR: Rt:3+/5  Lt: 4-/5  UE special tests: 10/25/2023 Full can: negative Empty can: postivie, tension and painful   FUNCTIONAL TESTS:  Not formally assessed on eval  GAIT: Distance walked: not formally assessed  Assistive device utilized: Rt TTA prosthesis  Level of assistance: supervision Comments: Rt TTA prosthesis appears to be short                                                                                                                                TREATMENT DATE:  11/26/2023 TherAct:  Pulleys 3 min shoulder flexion, 3 min scaption Supine horizontal abduction with yellow TB 2x10 with 2s hold  while on half foam roller  Standing bicep curls in ER 1x12 with 3s eccentric focus   Neuro Re-Ed:  Supine with half foam roller on spine to stretch chest and biceps 1x3 min hold  Standing rows with cable machine 2x8 with 35#  Standing Is, Ys, Ts 1x12 each    11/21/2023 TherAct:  Pulleys 4 min shoulder flexion, 4 min scaption Assessed ROM Standing bicep curls with focus on slow eccentrics for bicep loading 1x12 each direction with 4# (hands in supine, neutral, prone) Supine horizontal abduction with yellow TB 2x10 with 2s hold   Neuro Re-Ed:  Standing Is, Ys, Ts at wall 1x10 each for postural strength  Standing rows with cable machine 2x15 with 25# Supine with half foam roller on spine to stretch chest and biceps 1x3 min hold   11/19/2023 TherEx:  Pulleys 3 min shoulder flexion, 3 min scaption Bicep curls with focus on slow eccentrics for bicep loading 1x12 each direction (hands in supine, neutral, prone) Supine AAROM shoulder abduction with patient performing eccentric AROM 1x8 with 10s hold in max stretch  Patient transitioned to AROM concentric and eccentric for 1x2 with 10s  hold in max stretch  PT discussed patient on transitioning from isometrics to eccentric control to challenge biceps without overworking secondary to potential tendonitis   Neuro Re-Ed:  Standing rows with cable machine 3x12 with 25# Supine with half foam roller on spine to stretch chest and biceps 1x2 min hold   11/12/2023 TherEx:  Pulleys 3 min shoulder flexion, 3 min scaption  Supine AAROM shoulder flexion with 2# bar 2x12  Supine AAROM ER with 2# bar 2x12  Supine PT assisted abduction AAROM 1x10 with 10s hold in max stretch  Bicep isometrics in 3 directions ((hand in supine, neutral hand, prone hand) 1x10 with 3s hold   Self-Care:  Discussed MRI results, potential shoulder follow-up, POC   PATIENT EDUCATION:  Education details: HEP, POC Person educated: Patient Education method: Explanation, Demonstration, Tactile cues, Verbal cues, and Handouts Education comprehension: verbalized understanding and returned demonstration  HOME EXERCISE PROGRAM: Access Code: [email or web address] URL: [email or web address] Date: 10/25/2023 Prepared by: Susannah Daring  Exercises - Seated Cervical Retraction  - 1 x daily - 7 x weekly - 2 sets - 10 reps - 3s hold - Seated Scapular Retraction  - 1 x daily - 7 x weekly - 2 sets - 10 reps - 3s hold - Supine Shoulder Flexion with Dowel  - 1 x  daily - 7 x weekly - 2 sets - 10 reps - 3s hold - Supine Shoulder Abduction AAROM with Dowel  - 1 x daily - 7 x weekly - 2 sets - 10 reps - 3s hold  ASSESSMENT:  CLINICAL IMPRESSION: Patient arrived to session noting overall improvements, but occasional soreness felt in Rt shoulder/biceps. Patient tolerated all activities this date and showing improved posture with activities. Patient will continue to benefit from skilled PT.  OBJECTIVE IMPAIRMENTS: Abnormal gait, decreased activity tolerance, decreased balance, decreased coordination, decreased mobility, difficulty walking, decreased ROM, decreased strength,  impaired UE functional use, improper body mechanics, postural dysfunction, prosthetic dependency , and pain.   ACTIVITY LIMITATIONS: carrying, lifting, sleeping, bed mobility, and reach over head  PARTICIPATION LIMITATIONS: cleaning, community activity, and church  PERSONAL FACTORS: Past/current experiences and 3+ comorbidities: hypothyroidism, HTN, HLD, GERD, A-fib (s/p ablation), DM2, arthritis, PVD, CAD are also affecting patient's functional outcome.   REHAB POTENTIAL: Good  CLINICAL DECISION MAKING: Evolving/moderate complexity  EVALUATION COMPLEXITY: Moderate   GOALS: Goals reviewed with patient? Yes  SHORT TERM GOALS: Target date: 11/08/2023 Patient will show compliance with initial HEP. Baseline: Goal status: GOAL MET, 11/09/2023  2.  Patient will report pain levels no greater than 6/10 in order to show improved overall quality of life. Baseline:  Goal status: GOAL MET, 11/21/2023    LONG TERM GOALS: Target date: 12/19/2023  Patient will be independent with final HEP in order to maintain and progress upon functional gains made within PT. Baseline:  Goal status: goal ongoing, 11/21/2023  2.  Patient will report pain levels no greater than 3/10 in order to show improved overall quality of life. Baseline:  Goal status: goal ongoing, 11/21/2023  3.  Patient will increase PSFS to at least 6.33 in order to show a significant improvement in subjective disability rating. Baseline:  Goal status: GOAL MET, 11/21/2023  4.  Patient will increase Rt shoulder flexion ROM to at least 110deg in order to improve functional mobility. Baseline:  Goal status: GOAL MET, 11/21/2023  5.  Patient will increase Rt shoulder abduction ROM to at least 95deg in order to improve functional mobility. Baseline:  Goal status: GOAL MET, 11/21/2023     PLAN:  PT FREQUENCY: 1-2x/week  PT DURATION: 4 weeks  PLANNED INTERVENTIONS: [id number]- PT Re-evaluation, 97750- Physical Performance  Testing, 97110-Therapeutic exercises, 97530- Therapeutic activity, [id number]- Neuromuscular re-education, 97535- Self Care, [id number]- Manual therapy, [id number]- Gait training, [id number]- Prosthetic Initial , [id number]- Orthotic/Prosthetic subsequent, [phone]- Canalith repositioning, [id number]- Aquatic Therapy, H9716- Electrical stimulation (unattended), [phone]- Electrical stimulation (manual), [id number]- Vasopneumatic device, [id number]- Ultrasound, [id number]- Traction (mechanical), [id number]- Ionotophoresis 4mg /ml Dexamethasone , 97597- Wound care (first 20 sq cm), 97598- Wound care (each additional 20 sq cm), 20560 (1-2 muscles), 20561 (3+ muscles)- Dry Needling, Patient/Family education, Balance training, Stair training, Taping, Joint mobilization, Joint manipulation, Spinal manipulation, Spinal mobilization, Scar mobilization, Compression bandaging, Vestibular training, DME instructions, Cryotherapy, and Moist heat  PLAN FOR NEXT SESSION:   generalized mobility and strengthening for Rt shoulder, postural strengthening   Susannah Daring, PT, DPT 11/26/23 11:45 AM

## 2023-11-25 ENCOUNTER — Encounter: Payer: Self-pay | Admitting: Surgical

## 2023-11-25 MED ORDER — BUPIVACAINE HCL 0.25 % IJ SOLN
9.0000 mL | INTRAMUSCULAR | Status: AC | PRN
  Administered 2023-11-19: 9 mL via INTRA_ARTICULAR

## 2023-11-25 MED ORDER — TRIAMCINOLONE ACETONIDE 40 MG/ML IJ SUSP
40.0000 mg | INTRAMUSCULAR | Status: AC | PRN
  Administered 2023-11-19: 40 mg via INTRA_ARTICULAR

## 2023-11-25 NOTE — Progress Notes (Signed)
 Follow-up Office Visit Note   Patient: [name]           Date of Birth: [birth year]           MRN: [id number] Visit Date: 11/19/2023 Requested by: Dwight Trula SQUIBB, MD [address] [place],  [place] [address] PCP: Dwight Trula SQUIBB, MD  Subjective: Chief Complaint  Patient presents with   Right Shoulder - Pain, Follow-up    HPI: [name] is a 73 y.o. male who returns to the office for follow-up visit.    Plan at last visit was: Impression is functional shoulder on the right-hand side with rotator cuff tear which appears chronic. Does not look like he is having much in terms of biceps pain generation. Plan at this time is to start physical therapy for right shoulder below shoulder strengthening exercises 2 times a week for 4 weeks. We will start that at the end of September 2 weeks after his ablation for atrial fibrillation. Come back at the end of October for clinical recheck. Decision for against any type of surgical intervention at that time.   Since then, patient notes he had ablation last month.  He is now on Eliquis  and Plavix  and has been told by his cardiologist that he cannot suspend Eliquis  for another 60 days.  States that his right shoulder is very painful.  Localizes pain to the anterior aspect of the shoulder with radiation to the bicep muscle belly.  Has history of prior glenohumeral injection several months ago that gave him good relief for about 6 weeks.  He also has history of prior rotator cuff surgery by Dr. Vernetta about 3 years ago.  Had [facility] joint injection without any relief that was performed by Dr. Burnetta.  Has been going to physical therapy and doing range of motion exercises primarily.  Using a TENS unit with some relief.  Does have history of diabetes and last A1c was around 6.5 by his history.              ROS: All systems reviewed are negative as they relate to the chief complaint within the history of present illness.  Patient denies fevers or  chills.  Assessment & Plan: Visit Diagnoses:  1. Chronic right shoulder pain     Plan: [name] is a 73 y.o. male who returns to the office for follow-up visit.  Plan from last visit was noted above in HPI.  They now return with continued right shoulder pain.  He is here to discuss potential surgical intervention for his right shoulder per Dr. Brion last note.  Has full-thickness supraspinatus tear with retraction to the apex of the humeral head as well as mild to moderate fatty atrophy of the supraspinatus muscle belly.  Currently, he is not able to come off of Eliquis  which precludes surgical intervention.  He is on both Eliquis  and Plavix .  We will plan for right glenohumeral injection today to give him some symptomatic relief and see him back in 6 weeks for clinical recheck with Dr. Addie and consideration of posting for surgery at that time depending on his blood thinner status.  Patient agreed with plan.  Glenohumeral injection administered under ultrasound guidance and patient tolerated procedure well without complication.  Follow-up in 6 weeks.  Follow-Up Instructions: No follow-ups on file.   Orders:  Orders Placed This Encounter  Procedures   US  Guided Needle Placement - No Linked Charges   No orders of  the defined types were placed in this encounter.     Procedures: Large Joint Inj: R glenohumeral on 11/19/2023 12:22 PM Indications: pain and diagnostic evaluation Details: 22 G 3.5 in needle, ultrasound-guided posterior approach Medications: 9 mL bupivacaine  0.25 %; 40 mg triamcinolone  acetonide 40 MG/ML Outcome: tolerated well, no immediate complications Procedure, treatment alternatives, risks and benefits explained, specific risks discussed. Consent was given by the patient. Immediately prior to procedure a time out was called to verify the correct patient, procedure, equipment, support staff and site/side marked as required. Patient was prepped and draped in the usual  sterile fashion.       Clinical Data: No additional findings.  Objective: Vital Signs: There were no vitals taken for this visit.  Physical Exam:  Constitutional: Patient appears well-developed HEENT:  Head: Normocephalic Eyes:EOM are normal Neck: Normal range of motion Cardiovascular: Normal rate Pulmonary/chest: Effort normal Neurologic: Patient is alert Skin: Skin is warm Psychiatric: Patient has normal mood and affect  Ortho Exam: Ortho exam demonstrates ortho exam demonstrates left shoulder with 75 degrees X rotation, 120 degrees abduction, 150 degrees forward elevation which is compared with the right shoulder with 60 degrees external rotation, 90 degrees abduction, 130 degrees forward elevation passively.  No obvious deformity to inspection of the shoulder.  Axillary nerve is intact with deltoid firing.  2+ radial pulse of the bilateral upper extremities.  Intact EPL, FPL, finger abduction, pronation/supination, bicep, tricep, deltoid.  Rotator cuff strength testing demonstrates intact subscap and external rotation strength of the right shoulder.  Tenderness over the bicipital groove.  No AC joint tenderness. Specialty Comments:  No specialty comments available.  Imaging: No results found.   PMFS History: Patient Active Problem List   Diagnosis Date Noted   Atrial flutter (HCC) 06/27/2023   Paroxysmal atrial fibrillation with RVR (HCC) 08/23/2022   Ascending aortic aneurysm 08/23/2022   Bradycardia 08/22/2022   NSTEMI (non-ST elevated myocardial infarction) (HCC) 08/22/2022   Atrial fibrillation with RVR (HCC) 08/22/2022   Hypotension 01/07/2021   S/P arthroscopy of right shoulder 06/02/2020   Closed fracture of neck of right femur (HCC)    Hip fracture (HCC) 03/27/2020   Nontraumatic complete tear of right rotator cuff 02/26/2020   S/P transmetatarsal amputation of foot, left (HCC) 04/25/2016   Chronic osteomyelitis of toe, left (HCC)    Chronic diastolic CHF  (congestive heart failure) (HCC) 09/25/2015   Aortic stenosis 08/04/2014   Diabetic foot infection (HCC) 06/03/2014   Peripheral vascular disease 06/03/2014   Coronary artery disease involving native coronary artery of native heart without angina pectoris 07/29/2013   Trifascicular block 07/29/2013   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 remote stenting to ramus, prox LAD x2.   Dental crowns present    Diabetes mellitus    First degree AV block    GERD (gastroesophageal reflux disease)    Heart attack (HCC) 06/2004   Heart murmur    aortic   Hx MRSA infection 02/2006   Hyperlipidemia    Hypertension    hx. of - has not been on med. since losing wt. after gastric bypass  Hypothyroidism    Morbid obesity (HCC)    Mucoid cyst of joint 09/2011   left thumb   Sleep apnea    sleep study 03/29/2011; no CPAP use, lost >130lbs   Trifascicular block     Family History  Problem Relation Age of Onset   Heart disease Mother     Past Surgical History:  Procedure Laterality Date   AMPUTATION Left 04/19/2016   Procedure: Left Foot 4th Toe Amputation vs. Transmetatarsal;  Surgeon: Jerona Harden GAILS, MD;  Location: [facility] OR;  Service: Orthopedics;  Laterality: Left;   ATRIAL FIBRILLATION ABLATION N/A 10/11/2023   Procedure: ATRIAL FIBRILLATION ABLATION;  Surgeon: Nancey Eulas BRAVO, MD;  Location: [facility] INVASIVE CV LAB;  Service: Cardiovascular;  Laterality: N/A;   BIOPSY  04/07/2019   Procedure: BIOPSY;  Surgeon: Elicia Claw, MD;  Location: [facility];  Service: Gastroenterology;;   CATARACT EXTRACTION  02/2010; 03/2010   COLONOSCOPY WITH PROPOFOL  N/A 09/14/2014    Procedure: COLONOSCOPY WITH PROPOFOL ;  Surgeon: Gladis MARLA Louder, MD;  Location: [facility];  Service: Endoscopy;  Laterality: N/A;   COLONOSCOPY WITH PROPOFOL  N/A 04/07/2019   Procedure: COLONOSCOPY WITH PROPOFOL ;  Surgeon: Elicia Claw, MD;  Location: [facility];  Service: Gastroenterology;  Laterality: N/A;   CORONARY ANGIOPLASTY WITH STENT PLACEMENT  07/12/2004; 08/03/2004   total of 3 stents   CORONARY STENT INTERVENTION N/A 08/23/2022   Procedure: CORONARY STENT INTERVENTION;  Surgeon: Jordan, Peter M, MD;  Location: [facility] INVASIVE CV LAB;  Service: Cardiovascular;  Laterality: N/A;   I & D EXTREMITY Right 06/04/2014   Procedure: IRRIGATION AND DEBRIDEMENT EXTREMITY;  Surgeon: Lonni CINDERELLA Poli, MD;  Location: [facility] OR;  Service: Orthopedics;  Laterality: Right;   LEFT HEART CATH AND CORONARY ANGIOGRAPHY N/A 08/23/2022   Procedure: LEFT HEART CATH AND CORONARY ANGIOGRAPHY;  Surgeon: Jordan, Peter M, MD;  Location: [facility] INVASIVE CV LAB;  Service: Cardiovascular;  Laterality: N/A;   MASS EXCISION  10/04/2011   Procedure: EXCISION MASS;  Surgeon: Arley JONELLE Curia, MD;  Location: [facility];  Service: Orthopedics;  Laterality: Left;  excision cyst debridment ip joint of left thumb   PROSTATECTOMY     right below knee amputation  2018   ROUX-EN-Y GASTRIC BYPASS  10/10/2010   laparoscopic   SHOULDER ARTHROSCOPY WITH ROTATOR CUFF REPAIR AND SUBACROMIAL DECOMPRESSION Right 03/23/2020   Procedure: RIGHT SHOULDER ARTHROSCOPY WITH DEBRIDEMENT AND  ROTATOR CUFF REPAIR;  Surgeon: Poli Lonni CINDERELLA, MD;  Location: [facility] OR;  Service: Orthopedics;  Laterality: Right;   TOE AMPUTATION  02/23/2006   left foot second ray amputation   TOTAL HIP ARTHROPLASTY Right 03/29/2020   Procedure: TOTAL HIP ARTHROPLASTY POSTERIOR APPROACH;  Surgeon: Jerri Kay HERO, MD;  Location: [facility] OR;  Service: Orthopedics;  Laterality: Right;   TRANSMETATARSAL AMPUTATION Left    TRIGGER FINGER RELEASE Right 09/16/2019    Procedure: RELEASE TRIGGER FINGER/A-1 PULLEY;  Surgeon: Curia Arley, MD;  Location: [facility];  Service: Orthopedics;  Laterality: Right;  IV REGIONAL FOREARM BLOCK   Social History   Occupational History   Occupation: IT TRAINER taxes  Tobacco Use   Smoking status: Never   Smokeless tobacco: Never  Substance and Sexual Activity   Alcohol use: No   Drug use: No   Sexual activity: Yes

## 2023-11-26 ENCOUNTER — Ambulatory Visit (INDEPENDENT_AMBULATORY_CARE_PROVIDER_SITE_OTHER)

## 2023-11-26 ENCOUNTER — Encounter: Payer: Self-pay | Admitting: Radiology

## 2023-11-26 DIAGNOSIS — M6281 Muscle weakness (generalized): Secondary | ICD-10-CM

## 2023-11-26 DIAGNOSIS — G8929 Other chronic pain: Secondary | ICD-10-CM | POA: Diagnosis not present

## 2023-11-26 DIAGNOSIS — M25611 Stiffness of right shoulder, not elsewhere classified: Secondary | ICD-10-CM | POA: Diagnosis not present

## 2023-11-26 DIAGNOSIS — M25511 Pain in right shoulder: Secondary | ICD-10-CM

## 2023-11-26 DIAGNOSIS — R2689 Other abnormalities of gait and mobility: Secondary | ICD-10-CM | POA: Diagnosis not present

## 2023-11-26 DIAGNOSIS — M25552 Pain in left hip: Secondary | ICD-10-CM | POA: Diagnosis not present

## 2023-11-27 NOTE — Therapy (Signed)
 OUTPATIENT PHYSICAL THERAPY UPPER EXTREMITY TREATMENT   Patient Name: [name] MRN: [id number] DOB:[birth year], 73 y.o., male Today's Date: 11/28/2023     END OF SESSION:  PT End of Session - 11/28/23 1345     Visit Number 9    Number of Visits 15    Date for Recertification  12/19/23    Authorization Type MEDICARE AND BCBS    Progress Note Due on Visit 10    PT Start Time 1345    PT Stop Time 1427    PT Time Calculation (min) 42 min    Activity Tolerance Patient tolerated treatment well    Behavior During Therapy WFL for tasks assessed/performed           Past Medical History:  Diagnosis Date   Anemia    low iron   Arthritis    Coronary artery disease 2006   a. remote stenting to ramus, prox LAD x2.   Dental crowns present    Diabetes mellitus    First degree AV block    GERD (gastroesophageal reflux disease)    Heart attack (HCC) 06/2004   Heart murmur    aortic   Hx MRSA infection 02/2006   Hyperlipidemia    Hypertension    hx. of - has not been on med. since losing wt. after gastric bypass   Hypothyroidism    Morbid obesity (HCC)    Mucoid cyst of joint 09/2011   left thumb   Sleep apnea    sleep study 03/29/2011; no CPAP use, lost >130lbs   Trifascicular block    Past Surgical History:  Procedure Laterality Date   AMPUTATION Left 04/19/2016   Procedure: Left Foot 4th Toe Amputation vs. Transmetatarsal;  Surgeon: Jerona Harden GAILS, MD;  Location: [facility] OR;  Service: Orthopedics;  Laterality: Left;   ATRIAL FIBRILLATION ABLATION N/A 10/11/2023   Procedure: ATRIAL FIBRILLATION ABLATION;  Surgeon: Nancey Eulas BRAVO, MD;  Location: [facility];  Service: Cardiovascular;  Laterality: N/A;   BIOPSY  04/07/2019   Procedure: BIOPSY;  Surgeon: Elicia Claw, MD;  Location: [facility];  Service: Gastroenterology;;   CATARACT EXTRACTION  02/2010; 03/2010   COLONOSCOPY WITH PROPOFOL  N/A 09/14/2014   Procedure: COLONOSCOPY WITH PROPOFOL ;  Surgeon: Gladis MARLA Louder, MD;  Location: [facility];  Service: Endoscopy;  Laterality: N/A;   COLONOSCOPY WITH PROPOFOL  N/A 04/07/2019   Procedure: COLONOSCOPY WITH PROPOFOL ;  Surgeon: Elicia Claw, MD;  Location: [facility];  Service: Gastroenterology;  Laterality: N/A;   CORONARY ANGIOPLASTY WITH STENT PLACEMENT  07/12/2004; 08/03/2004   total of 3 stents   CORONARY STENT INTERVENTION N/A 08/23/2022   Procedure: CORONARY STENT INTERVENTION;  Surgeon: Jordan, Peter M, MD;  Location: [facility] INVASIVE CV LAB;  Service: Cardiovascular;  Laterality: N/A;   I & D EXTREMITY Right 06/04/2014   Procedure: IRRIGATION AND DEBRIDEMENT EXTREMITY;  Surgeon: Lonni CINDERELLA Poli, MD;  Location: [facility] OR;  Service: Orthopedics;  Laterality: Right;   LEFT HEART CATH AND CORONARY ANGIOGRAPHY N/A 08/23/2022   Procedure: LEFT HEART CATH AND CORONARY ANGIOGRAPHY;  Surgeon: Jordan, Peter M, MD;  Location: [facility] INVASIVE CV LAB;  Service: Cardiovascular;  Laterality: N/A;   MASS EXCISION  10/04/2011   Procedure: EXCISION MASS;  Surgeon: Arley JONELLE Curia, MD;  Location: [facility];  Service: Orthopedics;  Laterality: Left;  excision cyst debridment ip joint of left thumb   PROSTATECTOMY     right below knee amputation  2018   ROUX-EN-Y GASTRIC BYPASS  10/10/2010   laparoscopic   SHOULDER ARTHROSCOPY WITH ROTATOR CUFF REPAIR AND SUBACROMIAL DECOMPRESSION Right 03/23/2020   Procedure: RIGHT SHOULDER ARTHROSCOPY WITH DEBRIDEMENT AND  ROTATOR CUFF REPAIR;  Surgeon: Vernetta Lonni GRADE, MD;  Location: [facility] OR;  Service: Orthopedics;  Laterality: Right;   TOE AMPUTATION  02/23/2006   left foot second ray amputation   TOTAL HIP ARTHROPLASTY Right 03/29/2020   Procedure: TOTAL HIP ARTHROPLASTY POSTERIOR APPROACH;  Surgeon: Jerri Kay HERO, MD;  Location: [facility] OR;  Service: Orthopedics;  Laterality: Right;   TRANSMETATARSAL AMPUTATION Left    TRIGGER FINGER RELEASE Right 09/16/2019   Procedure: RELEASE TRIGGER FINGER/A-1 PULLEY;  Surgeon:  Murrell Kuba, MD;  Location: [facility];  Service: Orthopedics;  Laterality: Right;  IV REGIONAL FOREARM BLOCK   Patient Active Problem List   Diagnosis Date Noted   Atrial flutter (HCC) 06/27/2023   Paroxysmal atrial fibrillation with RVR (HCC) 08/23/2022   Ascending aortic aneurysm 08/23/2022   Bradycardia 08/22/2022   NSTEMI (non-ST elevated myocardial infarction) (HCC) 08/22/2022   Atrial fibrillation with RVR (HCC) 08/22/2022   Hypotension 01/07/2021   S/P arthroscopy of right shoulder 06/02/2020   Closed fracture of neck of right femur (HCC)    Hip fracture (HCC) 03/27/2020   Nontraumatic complete tear of right rotator cuff 02/26/2020   S/P transmetatarsal amputation of foot, left (HCC) 04/25/2016   Chronic osteomyelitis of toe, left (HCC)    Chronic diastolic CHF (congestive heart failure) (HCC) 09/25/2015   Aortic stenosis 08/04/2014   Diabetic foot infection (HCC) 06/03/2014   Peripheral vascular disease 06/03/2014   Coronary artery disease involving native coronary artery of native heart without angina pectoris 07/29/2013   Trifascicular block 07/29/2013   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 07/26/2011   Hypothyroidism 01/22/2007   Type 2 diabetes mellitus with vascular disease (HCC) 01/22/2007   HYPERLIPIDEMIA, MIXED 01/22/2007   MYOCARDIAL INFARCTION, HX OF 01/22/2007   ANEMIA, IRON DEFICIENCY, HX OF 01/22/2007    PCP: Trula SHAUNNA Brim, MD   REFERRING PROVIDER: Cordella Glendia Hutchinson, MD   REFERRING DIAG: [phone] (ICD-10-CM) - Chronic right shoulder pain   THERAPY DIAG:  Chronic right shoulder pain  Stiffness of right shoulder, not elsewhere classified  Other abnormalities of gait and mobility  Muscle weakness (generalized)  Pain in left hip  Rationale for Evaluation and Treatment: Rehabilitation  ONSET DATE: spring 2025  SUBJECTIVE:   SUBJECTIVE STATEMENT: Patient endorsing hips being more sore than shoulder this date secondary to  walking around on concrete all morning.   PERTINENT HISTORY: Patient has undergone Rt TTA, Rt shoulder arthroscopy, and Rt THA. Currently having Rt shoulder pain after 1 fall in the spring where he landed on Rt shoulder. Patient is now experiencing pain in Rt shoulder with lifting and sleeping. Patient also reports Lt hip pain that is currently being treated with injection, tramadol , and prednisone.    See PMH or personal factors for comorbidities  PAIN:  Are you having pain? Yes: NPRS scale: 1/10 Pain location: Rt shoulder, bicep with reaching  Pain description: sharp Aggravating factors: sleeping, lifting, reaching Relieving factors: rest, injection  PRECAUTIONS: Fall and Other: cardiac  RED FLAGS: None   WEIGHT BEARING RESTRICTIONS: No  FALLS:  Has patient fallen in last 6 months? Yes. Number of falls 1; thought he was locked into the prosthesis and was not    LIVING ENVIRONMENT: Lives with: lives with their spouse Lives in: House/apartment Stairs: Yes: Internal: 12 steps; rail/wall and External:  1 to get in the house from garage, 3 steps to patio steps; one rail on front, double to patio  Has following equipment at home: unable to assess on eval  OCCUPATION: retired, IT TRAINER  PLOF: Independent and Rt TTA prosthesis   PATIENT GOALS: get rid of the pain to lift and move things around  NEXT MD VISIT: 10/26/2023 for injection in Lt hip   OBJECTIVE:  Note: Objective measures were completed at Evaluation unless otherwise noted.  DIAGNOSTIC FINDINGS:  IMPRESSION: 1. Mild spurring of the left acetabulum and femoral head. 2. Mild proximal hamstring tendinopathy. 3. Trace nonspecific edema medially along the hip adductor musculature. 4. Contralateral (right) total hip prosthesis.  PATIENT SURVEYS:  PSFS: THE PATIENT SPECIFIC FUNCTIONAL SCALE  Place score of 0-10 (0 = unable to perform activity and 10 = able to perform activity at the same level as before injury or  problem)  Activity Date: 10/25/2023 11/09/2023 11/21/2023   Raising right arm above shoulder  5 2 8   2. Carrying load with right arm  4 7 9   3. Sleeping on right side  4 7 9   4.      Total Score 4.33 5.33 8.66    Total Score = Sum of activity scores/number of activities  Minimally Detectable Change: 3 points (for single activity); 2 points (for average score)  [facility] (nd). The Patient Specific Functional Scale . Retrieved from [email or web address]   COGNITION: Overall cognitive status: Within functional limits for tasks assessed     SENSATION: Not formally assessed on eval  EDEMA:    MUSCLE LENGTH: Not formally assessed on eval secondary to focus on UE  POSTURE: rounded shoulders, forward head, and increased thoracic kyphosis  PALPATION: Not formally assessed on eval  LOWER EXTREMITY ROM: not assessed on eval secondary to Rt UE focus   ROM Right Eval 10/25/2023 Left Eval 10/25/2023  Hip flexion    Hip extension    Hip abduction    Hip adduction    Hip internal rotation    Hip external rotation    Knee flexion    Knee extension    Ankle dorsiflexion    Ankle plantarflexion    Ankle inversion    Ankle eversion     (Blank rows = not tested)  LOWER EXTREMITY MMT: not assessed on eval secondary to Rt UE focus   MMT Right eval Left eval  Hip flexion    Hip extension    Hip abduction    Hip adduction    Hip internal rotation    Hip external rotation    Knee flexion    Knee extension    Ankle dorsiflexion    Ankle plantarflexion    Ankle inversion    Ankle eversion     (Blank rows = not tested)  LOWER EXTREMITY SPECIAL TESTS:  Not formally assessed on eval   Upper Extremity ROM: 10/25/2023 SEATED , AROM Shoulder flexion: Rt: 80deg Lt: WFL Shoulder abduction: Rt: 72deg Lt: WFL Shoulder ER: Rt: L3 Lt: T3 Shoulder IR: Rt: T1 Lt: T10  11/21/2023 Seated, AROM Rt shoulder flexion:  130deg Rt shoulder abduction: 150deg   Upper Extremity MMT:  SEATED ; Rt within available ROM  Shoulder flexion: Rt: 4+/5 Lt: 4+/5 Shoulder abduction: Rt:4+/5 Lt: 4+/5 Shoulder ER: Rt: 3+/5 Lt: 3+/5 Shoulder IR: Rt:3+/5  Lt: 4-/5  UE special tests: 10/25/2023 Full can: negative Empty can: postivie, tension and painful   FUNCTIONAL TESTS:  Not formally assessed on eval  GAIT: Distance walked: not formally assessed  Assistive device utilized: Rt TTA prosthesis  Level of assistance: supervision Comments: Rt TTA prosthesis appears to be short                                                                                                                                TREATMENT DATE:  11/28/2023 TherEx:   Pulleys 3 min shoulder flexion, 3 min scaption Tall kneeling thoracic extension with green theraball 3x30s  Seated horizontal abduction with red TB 2x10 with 2s hold   Neuro Re-Ed:  Standing rows with cable machine 2x12 with 35#  Standing Is, Ys, Ts 1x12 each    11/26/2023 TherAct:  Pulleys 3 min shoulder flexion, 3 min scaption Supine horizontal abduction with yellow TB 2x10 with 2s hold  while on half foam roller  Standing bicep curls in ER 1x12 with 3s eccentric focus   Neuro Re-Ed:  Supine with half foam roller on spine to stretch chest and biceps 1x3 min hold  Standing rows with cable machine 2x8 with 35#  Standing Is, Ys, Ts 1x12 each    11/21/2023 TherAct:  Pulleys 4 min shoulder flexion, 4 min scaption Assessed ROM Standing bicep curls with focus on slow eccentrics for bicep loading 1x12 each direction with 4# (hands in supine, neutral, prone) Supine horizontal abduction with yellow TB 2x10 with 2s hold   Neuro Re-Ed:  Standing Is, Ys, Ts at wall 1x10 each for postural strength  Standing rows with cable machine 2x15 with 25# Supine with half foam roller on spine to stretch chest and biceps 1x3 min hold   11/19/2023 TherEx:  Pulleys 3 min shoulder  flexion, 3 min scaption Bicep curls with focus on slow eccentrics for bicep loading 1x12 each direction (hands in supine, neutral, prone) Supine AAROM shoulder abduction with patient performing eccentric AROM 1x8 with 10s hold in max stretch  Patient transitioned to AROM concentric and eccentric for 1x2 with 10s hold in max stretch  PT discussed patient on transitioning from isometrics to eccentric control to challenge biceps without overworking secondary to potential tendonitis   Neuro Re-Ed:  Standing rows with cable machine 3x12 with 25# Supine with half foam roller on spine to stretch chest and biceps 1x2 min hold    PATIENT EDUCATION:  Education details: HEP, POC Person educated: Patient Education method: Explanation, Demonstration, Tactile cues, Verbal cues, and Handouts Education comprehension: verbalized understanding and returned demonstration  HOME EXERCISE PROGRAM: Access Code: [email or web address] URL: [email or web address] Date: 10/25/2023 Prepared by: Susannah Daring  Exercises - Seated Cervical Retraction  - 1 x daily - 7 x weekly - 2 sets - 10 reps - 3s hold - Seated Scapular Retraction  - 1 x daily - 7 x weekly - 2 sets - 10 reps - 3s hold - Supine Shoulder Flexion with Dowel  - 1 x daily - 7 x weekly - 2 sets - 10 reps - 3s hold - Supine  Shoulder Abduction AAROM with Dowel  - 1 x daily - 7 x weekly - 2 sets - 10 reps - 3s hold  ASSESSMENT:  CLINICAL IMPRESSION: Patient arrived to session noting continued improvement in Rt shoulder and having a follow up with MD for low back/hip tomorrow. Patient tolerated all activities this date with no noted increases in soreness/pain. Patient will continue to benefit from skilled PT.  OBJECTIVE IMPAIRMENTS: Abnormal gait, decreased activity tolerance, decreased balance, decreased coordination, decreased mobility, difficulty walking, decreased ROM, decreased strength, impaired UE functional use, improper body mechanics, postural  dysfunction, prosthetic dependency , and pain.   ACTIVITY LIMITATIONS: carrying, lifting, sleeping, bed mobility, and reach over head  PARTICIPATION LIMITATIONS: cleaning, community activity, and church  PERSONAL FACTORS: Past/current experiences and 3+ comorbidities: hypothyroidism, HTN, HLD, GERD, A-fib (s/p ablation), DM2, arthritis, PVD, CAD are also affecting patient's functional outcome.   REHAB POTENTIAL: Good  CLINICAL DECISION MAKING: Evolving/moderate complexity  EVALUATION COMPLEXITY: Moderate   GOALS: Goals reviewed with patient? Yes  SHORT TERM GOALS: Target date: 11/08/2023 Patient will show compliance with initial HEP. Baseline: Goal status: GOAL MET, 11/09/2023  2.  Patient will report pain levels no greater than 6/10 in order to show improved overall quality of life. Baseline:  Goal status: GOAL MET, 11/21/2023    LONG TERM GOALS: Target date: 12/19/2023  Patient will be independent with final HEP in order to maintain and progress upon functional gains made within PT. Baseline:  Goal status: goal ongoing, 11/21/2023  2.  Patient will report pain levels no greater than 3/10 in order to show improved overall quality of life. Baseline:  Goal status: goal ongoing, 11/21/2023  3.  Patient will increase PSFS to at least 6.33 in order to show a significant improvement in subjective disability rating. Baseline:  Goal status: GOAL MET, 11/21/2023  4.  Patient will increase Rt shoulder flexion ROM to at least 110deg in order to improve functional mobility. Baseline:  Goal status: GOAL MET, 11/21/2023  5.  Patient will increase Rt shoulder abduction ROM to at least 95deg in order to improve functional mobility. Baseline:  Goal status: GOAL MET, 11/21/2023     PLAN:  PT FREQUENCY: 1-2x/week  PT DURATION: 4 weeks  PLANNED INTERVENTIONS: [id number]- PT Re-evaluation, 97750- Physical Performance Testing, 97110-Therapeutic exercises, 97530- Therapeutic  activity, [id number]- Neuromuscular re-education, 97535- Self Care, 02859- Manual therapy, [id number]- Gait training, [id number]- Prosthetic Initial , [id number]- Orthotic/Prosthetic subsequent, [phone]- Canalith repositioning, [id number]- Aquatic Therapy, H9716- Electrical stimulation (unattended), [phone]- Electrical stimulation (manual), [id number]- Vasopneumatic device, [id number]- Ultrasound, [id number]- Traction (mechanical), [id number]- Ionotophoresis 4mg /ml Dexamethasone , 97597- Wound care (first 20 sq cm), 97598- Wound care (each additional 20 sq cm), 20560 (1-2 muscles), 20561 (3+ muscles)- Dry Needling, Patient/Family education, Balance training, Stair training, Taping, Joint mobilization, Joint manipulation, Spinal manipulation, Spinal mobilization, Scar mobilization, Compression bandaging, Vestibular training, DME instructions, Cryotherapy, and Moist heat  PLAN FOR NEXT SESSION:    generalized mobility and strengthening for Rt shoulder, postural strengthening   Susannah Daring, PT, DPT 11/28/23 3:31 PM

## 2023-11-28 ENCOUNTER — Ambulatory Visit (INDEPENDENT_AMBULATORY_CARE_PROVIDER_SITE_OTHER)

## 2023-11-28 DIAGNOSIS — M25552 Pain in left hip: Secondary | ICD-10-CM

## 2023-11-28 DIAGNOSIS — M25511 Pain in right shoulder: Secondary | ICD-10-CM

## 2023-11-28 DIAGNOSIS — R2689 Other abnormalities of gait and mobility: Secondary | ICD-10-CM

## 2023-11-28 DIAGNOSIS — M6281 Muscle weakness (generalized): Secondary | ICD-10-CM

## 2023-11-28 DIAGNOSIS — M25611 Stiffness of right shoulder, not elsewhere classified: Secondary | ICD-10-CM | POA: Diagnosis not present

## 2023-11-28 DIAGNOSIS — G8929 Other chronic pain: Secondary | ICD-10-CM | POA: Diagnosis not present

## 2023-11-29 ENCOUNTER — Other Ambulatory Visit (INDEPENDENT_AMBULATORY_CARE_PROVIDER_SITE_OTHER): Payer: Self-pay

## 2023-11-29 ENCOUNTER — Ambulatory Visit: Admitting: Orthopedic Surgery

## 2023-11-29 VITALS — BP 172/87 | HR 62 | Ht >= 80 in | Wt 225.0 lb

## 2023-11-29 DIAGNOSIS — M545 Low back pain, unspecified: Secondary | ICD-10-CM | POA: Diagnosis not present

## 2023-11-29 NOTE — Progress Notes (Signed)
 Orthopedic Spine Surgery Office Note  Assessment: Patient is a 73 y.o. male with 2 issues.  He has left posterior hip pain that seems consistent with SI joint as etiology based on exam.  He is also had numbness and paresthesias along the anterolateral aspect of the left thigh that seems radicular in nature.  Has stenosis at L3/4   Plan: -Explained that initially conservative treatment is tried as a significant number of patients may experience relief with these treatment modalities. Discussed that the conservative treatments include:  -activity modification  -physical therapy  -over the counter pain medications  -medrol  dosepak  -steroid injections -Patient has tried Tylenol , tramadol , steroid injections -Since patient is currently doing well, did not recommend any further treatment.  Discussed using Tylenol  up to 3000 mg in a 24-hour period and heat to the posterior hip -If he does develop pain again like before, he should return and we can do a repeat exam to determine where the pain is coming from -Patient should return to office on an as needed basis   Patient expressed understanding of the plan and all questions were answered to the patient's satisfaction.   ___________________________________________________________________________   History:  Patient is a 73 y.o. male who presents today for lumbar spine.  Patient has had several years of left posterior hip pain.  He notes that particular in the mornings.  He said it last about 15 minutes and then resolves as he gets going.  He then said it got worse in July.  There is no trauma or injury that preceded the onset of the worsening pain.  He feels it in the left posterior hip.  He then had some pain over the lateral hip as well.  After about a month, he developed numbness and paresthesias over the anterolateral left thigh.  She did not have any pain radiating to the thigh.  He had no pain radiating past the knee.  He has no right lower  extremity symptoms.  His pain has gotten significantly better since onset.  He said he is back to his baseline where he has 15 minutes or so of pain in the mornings in the left posterior hip.  That resolves after he gets going with his day.   Weakness: Denies Symptoms of imbalance: Denies Paresthesias and numbness: Denies Bowel or bladder incontinence: Denies Saddle anesthesia: Denies  Treatments tried: Tylenol , tramadol , steroid injections  Review of systems: Denies fevers and chills, night sweats, unexplained weight loss, history of cancer.  Has had pain that wakes him at night  Past medical history: CAD DM GERD AV block HLD HTN Hypothyroidism OSA  Allergies: penicillin, moxifloxacin, quinolones, bactrim   Past surgical history:  Gastric bypass Cataract surgery Coronary stent placement Left mucoid cyst excision Right lower extremity I&D Left transmetatarsal amputation Right shoulder rotator cuff repair Prostatectomy Right THA  Right BKA Trigger finger release  Social history: Denies use of nicotine product (smoking, vaping, patches, smokeless) Alcohol use: denies Denies recreational drug use   Physical Exam:  BMI of 24.7  General: no acute distress, appears stated age Neurologic: alert, answering questions appropriately, following commands Respiratory: unlabored breathing on room air, symmetric chest rise Psychiatric: appropriate affect, normal cadence to speech   MSK (spine):  -Strength exam      Left  Right EHL    -/5  -/5 TA    5/5  -/5 GSC    5/5  -/5 Knee extension  5/5  5/5 Hip flexion   5/5  5/5  -  Sensory exam    Sensation intact to light touch in L3-S1 nerve distributions of bilateral lower extremities  -Achilles DTR: 1/4 on the left, -/4 on the right -Patellar tendon DTR: 2/4 on the left, -/4 on the right  -Straight leg raise: negative bilaterally -Clonus: no beats bilaterally  -Left hip exam: no pain through range of motion,  negative Stinchfield, positive FABER, positive Gaenslen's, positive SI joint compression test, tender to palpation over the SI joint -Right hip exam: no pain through range of motion, negative stinchfield  Imaging: XRs of the lumbar spine from 11/29/2023 were independently reviewed and interpreted, showing scoliotic curvature with apex to the right at L2/3. Disc height loss at L2/3 with anterior osteophyte formation. Disc height loss at L3/4. No fracture or dislocation seen. No evidence of instability on flexion/extension views.  MRI of the lumbar spine from 11/08/2023 was independently reviewed and interpreted, showing central and bilateral foraminal stenosis at L3/4. No other significant stenosis seen.    Patient name: [name] Patient MRN: [id number] Date of visit: 11/29/23

## 2023-11-30 NOTE — Therapy (Signed)
 OUTPATIENT PHYSICAL THERAPY UPPER EXTREMITY TREATMENT   Patient Name: [name] MRN: [id number] DOB:1950-05-21, 73 y.o., male Today's Date: 12/03/2023     END OF SESSION:  PT End of Session - 12/03/23 1105     Visit Number 10    Number of Visits 15    Date for Recertification  12/19/23    Authorization Type MEDICARE AND BCBS    Progress Note Due on Visit 10    PT Start Time 1103    PT Stop Time 1142    PT Time Calculation (min) 39 min    Activity Tolerance Patient tolerated treatment well    Behavior During Therapy WFL for tasks assessed/performed          Past Medical History:  Diagnosis Date   Anemia    low iron   Arthritis    Coronary artery disease 2006   a. remote stenting to ramus, prox LAD x2.   Dental crowns present    Diabetes mellitus    First degree AV block    GERD (gastroesophageal reflux disease)    Heart attack (HCC) 06/2004   Heart murmur    aortic   Hx MRSA infection 02/2006   Hyperlipidemia    Hypertension    hx. of - has not been on med. since losing wt. after gastric bypass   Hypothyroidism    Morbid obesity (HCC)    Mucoid cyst of joint 09/2011   left thumb   Sleep apnea    sleep study 03/29/2011; no CPAP use, lost >130lbs   Trifascicular block    Past Surgical History:  Procedure Laterality Date   AMPUTATION Left 04/19/2016   Procedure: Left Foot 4th Toe Amputation vs. Transmetatarsal;  Surgeon: Jerona Harden GAILS, MD;  Location: [facility] OR;  Service: Orthopedics;  Laterality: Left;   ATRIAL FIBRILLATION ABLATION N/A 10/11/2023   Procedure: ATRIAL FIBRILLATION ABLATION;  Surgeon: Nancey Eulas BRAVO, MD;  Location: [facility];  Service: Cardiovascular;  Laterality: N/A;   BIOPSY  04/07/2019   Procedure: BIOPSY;  Surgeon: Elicia Claw, MD;  Location: [facility];  Service: Gastroenterology;;   CATARACT EXTRACTION  02/2010; 03/2010   COLONOSCOPY WITH PROPOFOL  N/A 09/14/2014   Procedure: COLONOSCOPY WITH PROPOFOL ;  Surgeon: Gladis MARLA Louder, MD;  Location: [facility];  Service: Endoscopy;  Laterality: N/A;   COLONOSCOPY WITH PROPOFOL  N/A 04/07/2019   Procedure: COLONOSCOPY WITH PROPOFOL ;  Surgeon: Elicia Claw, MD;  Location: [facility];  Service: Gastroenterology;  Laterality: N/A;   CORONARY ANGIOPLASTY WITH STENT PLACEMENT  07/12/2004; 08/03/2004   total of 3 stents   CORONARY STENT INTERVENTION N/A 08/23/2022   Procedure: CORONARY STENT INTERVENTION;  Surgeon: Jordan, Peter M, MD;  Location: [facility] INVASIVE CV LAB;  Service: Cardiovascular;  Laterality: N/A;   I & D EXTREMITY Right 06/04/2014   Procedure: IRRIGATION AND DEBRIDEMENT EXTREMITY;  Surgeon: Lonni CINDERELLA Poli, MD;  Location: [facility];  Service: Orthopedics;  Laterality: Right;   LEFT HEART CATH AND CORONARY ANGIOGRAPHY N/A 08/23/2022   Procedure: LEFT HEART CATH AND CORONARY ANGIOGRAPHY;  Surgeon: Jordan, Peter M, MD;  Location: [facility] INVASIVE CV LAB;  Service: Cardiovascular;  Laterality: N/A;   MASS EXCISION  10/04/2011   Procedure: EXCISION MASS;  Surgeon: Arley JONELLE Curia, MD;  Location: [facility];  Service: Orthopedics;  Laterality: Left;  excision cyst debridment ip joint of left thumb   PROSTATECTOMY     right below knee amputation  2018   ROUX-EN-Y GASTRIC BYPASS  10/10/2010   laparoscopic   SHOULDER ARTHROSCOPY WITH ROTATOR CUFF REPAIR AND SUBACROMIAL DECOMPRESSION Right 03/23/2020   Procedure: RIGHT SHOULDER ARTHROSCOPY WITH DEBRIDEMENT AND  ROTATOR CUFF REPAIR;  Surgeon: Vernetta Lonni GRADE, MD;  Location: [facility] OR;  Service: Orthopedics;  Laterality: Right;   TOE AMPUTATION  02/23/2006   left foot second ray amputation   TOTAL HIP ARTHROPLASTY Right 03/29/2020   Procedure: TOTAL HIP ARTHROPLASTY POSTERIOR APPROACH;  Surgeon: Jerri Kay HERO, MD;  Location: [facility] OR;  Service: Orthopedics;  Laterality: Right;   TRANSMETATARSAL AMPUTATION Left    TRIGGER FINGER RELEASE Right 09/16/2019   Procedure: RELEASE TRIGGER FINGER/A-1 PULLEY;  Surgeon:  Murrell Kuba, MD;  Location: [facility];  Service: Orthopedics;  Laterality: Right;  IV REGIONAL FOREARM BLOCK   Patient Active Problem List   Diagnosis Date Noted   Atrial flutter (HCC) 06/27/2023   Paroxysmal atrial fibrillation with RVR (HCC) 08/23/2022   Ascending aortic aneurysm 08/23/2022   Bradycardia 08/22/2022   NSTEMI (non-ST elevated myocardial infarction) (HCC) 08/22/2022   Atrial fibrillation with RVR (HCC) 08/22/2022   Hypotension 01/07/2021   S/P arthroscopy of right shoulder 06/02/2020   Closed fracture of neck of right femur (HCC)    Hip fracture (HCC) 03/27/2020   Nontraumatic complete tear of right rotator cuff 02/26/2020   S/P transmetatarsal amputation of foot, left (HCC) 04/25/2016   Chronic osteomyelitis of toe, left (HCC)    Chronic diastolic CHF (congestive heart failure) (HCC) 09/25/2015   Aortic stenosis 08/04/2014   Diabetic foot infection (HCC) 06/03/2014   Peripheral vascular disease 06/03/2014   Coronary artery disease involving native coronary artery of native heart without angina pectoris 07/29/2013   Trifascicular block 07/29/2013   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 07/26/2011   Hypothyroidism 01/22/2007   Type 2 diabetes mellitus with vascular disease (HCC) 01/22/2007   HYPERLIPIDEMIA, MIXED 01/22/2007   MYOCARDIAL INFARCTION, HX OF 01/22/2007   ANEMIA, IRON DEFICIENCY, HX OF 01/22/2007    PCP: Trula SHAUNNA Brim, MD   REFERRING PROVIDER: Cordella Glendia Hutchinson, MD   REFERRING DIAG: [phone] (ICD-10-CM) - Chronic right shoulder pain   THERAPY DIAG:  Chronic right shoulder pain  Stiffness of right shoulder, not elsewhere classified  Other abnormalities of gait and mobility  Muscle weakness (generalized)  Pain in left hip  Rationale for Evaluation and Treatment: Rehabilitation  ONSET DATE: spring 2025  SUBJECTIVE:   SUBJECTIVE STATEMENT: Patient reports that he is not experiencing pain in his shoulder.     PERTINENT HISTORY: Patient has undergone Rt TTA, Rt shoulder arthroscopy, and Rt THA. Currently having Rt shoulder pain after 1 fall in the spring where he landed on Rt shoulder. Patient is now experiencing pain in Rt shoulder with lifting and sleeping. Patient also reports Lt hip pain that is currently being treated with injection, tramadol , and prednisone.    See PMH or personal factors for comorbidities  PAIN:  Are you having pain? Yes: NPRS scale: 0/10 Pain location: Rt shoulder, bicep with reaching  Pain description: sharp Aggravating factors: sleeping, lifting, reaching Relieving factors: rest, injection  PRECAUTIONS: Fall and Other: cardiac  RED FLAGS: None   WEIGHT BEARING RESTRICTIONS: No  FALLS:  Has patient fallen in last 6 months? Yes. Number of falls 1; thought he was locked into the prosthesis and was not    LIVING ENVIRONMENT: Lives with: lives with their spouse Lives in: House/apartment Stairs: Yes: Internal: 12 steps; rail/wall and External: 1 to get in the house  from garage, 3 steps to patio steps; one rail on front, double to patio  Has following equipment at home: unable to assess on eval  OCCUPATION: retired, IT TRAINER  PLOF: Independent and Rt TTA prosthesis   PATIENT GOALS: get rid of the pain to lift and move things around  NEXT MD VISIT: 10/26/2023 for injection in Lt hip   OBJECTIVE:  Note: Objective measures were completed at Evaluation unless otherwise noted.  DIAGNOSTIC FINDINGS:  IMPRESSION: 1. Mild spurring of the left acetabulum and femoral head. 2. Mild proximal hamstring tendinopathy. 3. Trace nonspecific edema medially along the hip adductor musculature. 4. Contralateral (right) total hip prosthesis.  PATIENT SURVEYS:  PSFS: THE PATIENT SPECIFIC FUNCTIONAL SCALE  Place score of 0-10 (0 = unable to perform activity and 10 = able to perform activity at the same level as before injury or problem)  Activity Date: 10/25/2023 11/09/2023  11/21/2023   Raising right arm above shoulder  5 2 8   2. Carrying load with right arm  4 7 9   3. Sleeping on right side  4 7 9   4.      Total Score 4.33 5.33 8.66    Total Score = Sum of activity scores/number of activities  Minimally Detectable Change: 3 points (for single activity); 2 points (for average score)  [facility] (nd). The Patient Specific Functional Scale . Retrieved from [email or web address]   COGNITION: Overall cognitive status: Within functional limits for tasks assessed     SENSATION: Not formally assessed on eval  EDEMA:    MUSCLE LENGTH: Not formally assessed on eval secondary to focus on UE  POSTURE: rounded shoulders, forward head, and increased thoracic kyphosis  PALPATION: Not formally assessed on eval  LOWER EXTREMITY ROM: not assessed on eval secondary to Rt UE focus   ROM Right Eval 10/25/2023 Left Eval 10/25/2023  Hip flexion    Hip extension    Hip abduction    Hip adduction    Hip internal rotation    Hip external rotation    Knee flexion    Knee extension    Ankle dorsiflexion    Ankle plantarflexion    Ankle inversion    Ankle eversion     (Blank rows = not tested)  LOWER EXTREMITY MMT: not assessed on eval secondary to Rt UE focus   MMT Right eval Left eval  Hip flexion    Hip extension    Hip abduction    Hip adduction    Hip internal rotation    Hip external rotation    Knee flexion    Knee extension    Ankle dorsiflexion    Ankle plantarflexion    Ankle inversion    Ankle eversion     (Blank rows = not tested)  LOWER EXTREMITY SPECIAL TESTS:  Not formally assessed on eval   Upper Extremity ROM: 10/25/2023 SEATED , AROM Shoulder flexion: Rt: 80deg Lt: WFL Shoulder abduction: Rt: 72deg Lt: WFL Shoulder ER: Rt: L3 Lt: T3 Shoulder IR: Rt: T1 Lt: T10  11/21/2023 Seated, AROM Rt shoulder flexion: 130deg Rt shoulder abduction: 150deg   Upper  Extremity MMT:  SEATED ; Rt within available ROM  Shoulder flexion: Rt: 4+/5 Lt: 4+/5 Shoulder abduction: Rt:4+/5 Lt: 4+/5 Shoulder ER: Rt: 3+/5 Lt: 3+/5 Shoulder IR: Rt:3+/5  Lt: 4-/5  UE special tests: 10/25/2023 Full can: negative Empty can: postivie, tension and painful   FUNCTIONAL TESTS:  Not formally assessed on eval  GAIT: Distance walked: not formally assessed  Assistive device utilized: Rt TTA prosthesis  Level of assistance: supervision Comments: Rt TTA prosthesis appears to be short                                                                                                                                TREATMENT DATE:  12/03/2023 TherEx:   Pulleys 3 min shoulder flexion, 3 min scaption Standing bicep curls with slow eccentrics 1x12 each direction with 5# DBs (hands in supine, neutral, prone)  Neuro Re-Ed:  Supine with half foam roller on spine to stretch chest and biceps 1x3 min hold  Horizontal abduction with red TB 2x12 with 2s hold while on half foam roller  Standing rows with cable machine 2x15 with 35# Standing straight arm pulldowns with cable machine 2x15 with 20#    11/28/2023 TherEx:   Pulleys 3 min shoulder flexion, 3 min scaption Tall kneeling thoracic extension with green theraball 3x30s  Seated horizontal abduction with red TB 2x10 with 2s hold   Neuro Re-Ed:  Standing rows with cable machine 2x12 with 35#  Standing Is, Ys, Ts 1x12 each    11/26/2023 TherAct:  Pulleys 3 min shoulder flexion, 3 min scaption Supine horizontal abduction with yellow TB 2x10 with 2s hold  while on half foam roller  Standing bicep curls in ER 1x12 with 3s eccentric focus   Neuro Re-Ed:  Supine with half foam roller on spine to stretch chest and biceps 1x3 min hold  Standing rows with cable machine 2x8 with 35#  Standing Is, Ys, Ts 1x12 each    11/21/2023 TherAct:  Pulleys 4 min shoulder flexion, 4 min scaption Assessed ROM Standing bicep curls with  focus on slow eccentrics for bicep loading 1x12 each direction with 4# (hands in supine, neutral, prone) Supine horizontal abduction with yellow TB 2x10 with 2s hold   Neuro Re-Ed:  Standing Is, Ys, Ts at wall 1x10 each for postural strength  Standing rows with cable machine 2x15 with 25# Supine with half foam roller on spine to stretch chest and biceps 1x3 min hold    PATIENT EDUCATION:  Education details: HEP, POC Person educated: Patient Education method: Explanation, Demonstration, Tactile cues, Verbal cues, and Handouts Education comprehension: verbalized understanding and returned demonstration  HOME EXERCISE PROGRAM: Access Code: [email or web address] URL: [email or web address] Date: 10/25/2023 Prepared by: Susannah Daring  Exercises - Seated Cervical Retraction  - 1 x daily - 7 x weekly - 2 sets - 10 reps - 3s hold - Seated Scapular Retraction  - 1 x daily - 7 x weekly - 2 sets - 10 reps - 3s hold - Supine Shoulder Flexion with Dowel  - 1 x daily - 7 x weekly - 2 sets - 10 reps - 3s hold - Supine Shoulder Abduction AAROM with Dowel  - 1 x daily - 7 x weekly - 2 sets - 10 reps - 3s hold  ASSESSMENT:  CLINICAL IMPRESSION: Patient arrived to session noting  no pain or difficulty with Rt shoulder. Patient tolerated all activities this date with no increase in pain. Patient will continue to benefit from skilled PT.  OBJECTIVE IMPAIRMENTS: Abnormal gait, decreased activity tolerance, decreased balance, decreased coordination, decreased mobility, difficulty walking, decreased ROM, decreased strength, impaired UE functional use, improper body mechanics, postural dysfunction, prosthetic dependency , and pain.   ACTIVITY LIMITATIONS: carrying, lifting, sleeping, bed mobility, and reach over head  PARTICIPATION LIMITATIONS: cleaning, community activity, and church  PERSONAL FACTORS: Past/current experiences and 3+ comorbidities: hypothyroidism, HTN, HLD, GERD, A-fib (s/p ablation),  DM2, arthritis, PVD, CAD are also affecting patient's functional outcome.   REHAB POTENTIAL: Good  CLINICAL DECISION MAKING: Evolving/moderate complexity  EVALUATION COMPLEXITY: Moderate   GOALS: Goals reviewed with patient? Yes  SHORT TERM GOALS: Target date: 11/08/2023 Patient will show compliance with initial HEP. Baseline: Goal status: GOAL MET, 11/09/2023  2.  Patient will report pain levels no greater than 6/10 in order to show improved overall quality of life. Baseline:  Goal status: GOAL MET, 11/21/2023    LONG TERM GOALS: Target date: 12/19/2023  Patient will be independent with final HEP in order to maintain and progress upon functional gains made within PT. Baseline:  Goal status: goal ongoing, 11/21/2023  2.  Patient will report pain levels no greater than 3/10 in order to show improved overall quality of life. Baseline:  Goal status: goal ongoing, 11/21/2023  3.  Patient will increase PSFS to at least 6.33 in order to show a significant improvement in subjective disability rating. Baseline:  Goal status: GOAL MET, 11/21/2023  4.  Patient will increase Rt shoulder flexion ROM to at least 110deg in order to improve functional mobility. Baseline:  Goal status: GOAL MET, 11/21/2023  5.  Patient will increase Rt shoulder abduction ROM to at least 95deg in order to improve functional mobility. Baseline:  Goal status: GOAL MET, 11/21/2023     PLAN:  PT FREQUENCY: 1-2x/week  PT DURATION: 4 weeks  PLANNED INTERVENTIONS: [id number]- PT Re-evaluation, 97750- Physical Performance Testing, 97110-Therapeutic exercises, 97530- Therapeutic activity, [id number]- Neuromuscular re-education, 97535- Self Care, [id number]- Manual therapy, [id number]- Gait training, [id number]- Prosthetic Initial , [id number]- Orthotic/Prosthetic subsequent, [phone]- Canalith repositioning, [id number]- Aquatic Therapy, H9716- Electrical stimulation (unattended), [id number]- Electrical stimulation (manual), [id number]- Vasopneumatic  device, [id number]- Ultrasound, [id number]- Traction (mechanical), [id number]- Ionotophoresis 4mg /ml Dexamethasone , 97597- Wound care (first 20 sq cm), 97598- Wound care (each additional 20 sq cm), 20560 (1-2 muscles), 20561 (3+ muscles)- Dry Needling, Patient/Family education, Balance training, Stair training, Taping, Joint mobilization, Joint manipulation, Spinal manipulation, Spinal mobilization, Scar mobilization, Compression bandaging, Vestibular training, DME instructions, Cryotherapy, and Moist heat  PLAN FOR NEXT SESSION:     generalized mobility and strengthening for Rt shoulder, postural strengthening   Susannah Daring, PT, DPT 12/03/23 11:43 AM

## 2023-12-03 ENCOUNTER — Ambulatory Visit (INDEPENDENT_AMBULATORY_CARE_PROVIDER_SITE_OTHER)

## 2023-12-03 ENCOUNTER — Encounter: Payer: Self-pay | Admitting: Cardiovascular Disease

## 2023-12-03 DIAGNOSIS — M25552 Pain in left hip: Secondary | ICD-10-CM

## 2023-12-03 DIAGNOSIS — M6281 Muscle weakness (generalized): Secondary | ICD-10-CM | POA: Diagnosis not present

## 2023-12-03 DIAGNOSIS — G8929 Other chronic pain: Secondary | ICD-10-CM | POA: Diagnosis not present

## 2023-12-03 DIAGNOSIS — M25511 Pain in right shoulder: Secondary | ICD-10-CM

## 2023-12-03 DIAGNOSIS — R2689 Other abnormalities of gait and mobility: Secondary | ICD-10-CM | POA: Diagnosis not present

## 2023-12-03 DIAGNOSIS — M25611 Stiffness of right shoulder, not elsewhere classified: Secondary | ICD-10-CM

## 2023-12-04 NOTE — Therapy (Signed)
 OUTPATIENT PHYSICAL THERAPY UPPER EXTREMITY TREATMENT / DISCHARGE   Patient Name: [name] MRN: [id number] DOB:1950-08-06, 73 y.o., male Today's Date: 12/05/2023     END OF SESSION:  PT End of Session - 12/05/23 1102     Visit Number 11    Number of Visits 15    Date for Recertification  12/19/23    Authorization Type MEDICARE AND BCBS    Progress Note Due on Visit 10    PT Start Time 1102    PT Stop Time 1141    PT Time Calculation (min) 39 min    Activity Tolerance Patient tolerated treatment well    Behavior During Therapy WFL for tasks assessed/performed           Past Medical History:  Diagnosis Date   Anemia    low iron   Arthritis    Coronary artery disease 2006   a. remote stenting to ramus, prox LAD x2.   Dental crowns present    Diabetes mellitus    First degree AV block    GERD (gastroesophageal reflux disease)    Heart attack (HCC) 06/2004   Heart murmur    aortic   Hx MRSA infection 02/2006   Hyperlipidemia    Hypertension    hx. of - has not been on med. since losing wt. after gastric bypass   Hypothyroidism    Morbid obesity (HCC)    Mucoid cyst of joint 09/2011   left thumb   Sleep apnea    sleep study 03/29/2011; no CPAP use, lost >130lbs   Trifascicular block    Past Surgical History:  Procedure Laterality Date   AMPUTATION Left 04/19/2016   Procedure: Left Foot 4th Toe Amputation vs. Transmetatarsal;  Surgeon: Jerona Harden GAILS, MD;  Location: [facility] OR;  Service: Orthopedics;  Laterality: Left;   ATRIAL FIBRILLATION ABLATION N/A 10/11/2023   Procedure: ATRIAL FIBRILLATION ABLATION;  Surgeon: Nancey Eulas BRAVO, MD;  Location: [facility] INVASIVE CV LAB;  Service: Cardiovascular;  Laterality: N/A;   BIOPSY  04/07/2019   Procedure: BIOPSY;  Surgeon: Elicia Claw, MD;  Location: [facility];  Service: Gastroenterology;;   CATARACT EXTRACTION  02/2010; 03/2010   COLONOSCOPY WITH PROPOFOL  N/A 09/14/2014   Procedure: COLONOSCOPY WITH PROPOFOL ;  Surgeon:  Gladis MARLA Louder, MD;  Location: [facility];  Service: Endoscopy;  Laterality: N/A;   COLONOSCOPY WITH PROPOFOL  N/A 04/07/2019   Procedure: COLONOSCOPY WITH PROPOFOL ;  Surgeon: Elicia Claw, MD;  Location: [facility];  Service: Gastroenterology;  Laterality: N/A;   CORONARY ANGIOPLASTY WITH STENT PLACEMENT  07/12/2004; 08/03/2004   total of 3 stents   CORONARY STENT INTERVENTION N/A 08/23/2022   Procedure: CORONARY STENT INTERVENTION;  Surgeon: Jordan, Peter M, MD;  Location: [facility] INVASIVE CV LAB;  Service: Cardiovascular;  Laterality: N/A;   I & D EXTREMITY Right 06/04/2014   Procedure: IRRIGATION AND DEBRIDEMENT EXTREMITY;  Surgeon: Lonni CINDERELLA Poli, MD;  Location: [facility] OR;  Service: Orthopedics;  Laterality: Right;   LEFT HEART CATH AND CORONARY ANGIOGRAPHY N/A 08/23/2022   Procedure: LEFT HEART CATH AND CORONARY ANGIOGRAPHY;  Surgeon: Jordan, Peter M, MD;  Location: [facility] INVASIVE CV LAB;  Service: Cardiovascular;  Laterality: N/A;   MASS EXCISION  10/04/2011   Procedure: EXCISION MASS;  Surgeon: Arley JONELLE Curia, MD;  Location: [facility];  Service: Orthopedics;  Laterality: Left;  excision cyst debridment ip joint of left thumb   PROSTATECTOMY     right below knee amputation  2018   ROUX-EN-Y  GASTRIC BYPASS  10/10/2010   laparoscopic   SHOULDER ARTHROSCOPY WITH ROTATOR CUFF REPAIR AND SUBACROMIAL DECOMPRESSION Right 03/23/2020   Procedure: RIGHT SHOULDER ARTHROSCOPY WITH DEBRIDEMENT AND  ROTATOR CUFF REPAIR;  Surgeon: Vernetta Lonni GRADE, MD;  Location: [facility] OR;  Service: Orthopedics;  Laterality: Right;   TOE AMPUTATION  02/23/2006   left foot second ray amputation   TOTAL HIP ARTHROPLASTY Right 03/29/2020   Procedure: TOTAL HIP ARTHROPLASTY POSTERIOR APPROACH;  Surgeon: Jerri Kay HERO, MD;  Location: [facility] OR;  Service: Orthopedics;  Laterality: Right;   TRANSMETATARSAL AMPUTATION Left    TRIGGER FINGER RELEASE Right 09/16/2019   Procedure: RELEASE TRIGGER FINGER/A-1 PULLEY;   Surgeon: Murrell Kuba, MD;  Location: [facility];  Service: Orthopedics;  Laterality: Right;  IV REGIONAL FOREARM BLOCK   Patient Active Problem List   Diagnosis Date Noted   Atrial flutter (HCC) 06/27/2023   Paroxysmal atrial fibrillation with RVR (HCC) 08/23/2022   Ascending aortic aneurysm 08/23/2022   Bradycardia 08/22/2022   NSTEMI (non-ST elevated myocardial infarction) (HCC) 08/22/2022   Atrial fibrillation with RVR (HCC) 08/22/2022   Hypotension 01/07/2021   S/P arthroscopy of right shoulder 06/02/2020   Closed fracture of neck of right femur (HCC)    Hip fracture (HCC) 03/27/2020   Nontraumatic complete tear of right rotator cuff 02/26/2020   S/P transmetatarsal amputation of foot, left (HCC) 04/25/2016   Chronic osteomyelitis of toe, left (HCC)    Chronic diastolic CHF (congestive heart failure) (HCC) 09/25/2015   Aortic stenosis 08/04/2014   Diabetic foot infection (HCC) 06/03/2014   Peripheral vascular disease 06/03/2014   Coronary artery disease involving native coronary artery of native heart without angina pectoris 07/29/2013   Trifascicular block 07/29/2013   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 07/26/2011   Hypothyroidism 01/22/2007   Type 2 diabetes mellitus with vascular disease (HCC) 01/22/2007   HYPERLIPIDEMIA, MIXED 01/22/2007   MYOCARDIAL INFARCTION, HX OF 01/22/2007   ANEMIA, IRON DEFICIENCY, HX OF 01/22/2007    PCP: Trula SHAUNNA Brim, MD   REFERRING PROVIDER: Cordella Glendia Hutchinson, MD   REFERRING DIAG: [phone] (ICD-10-CM) - Chronic right shoulder pain   THERAPY DIAG:  Chronic right shoulder pain  Stiffness of right shoulder, not elsewhere classified  Muscle weakness (generalized)  Pain in left hip  Other abnormalities of gait and mobility  Rationale for Evaluation and Treatment: Rehabilitation  ONSET DATE: spring 2025  SUBJECTIVE:   SUBJECTIVE STATEMENT: Patient reports shoulder feeling just fine.    PERTINENT  HISTORY: Patient has undergone Rt TTA, Rt shoulder arthroscopy, and Rt THA. Currently having Rt shoulder pain after 1 fall in the spring where he landed on Rt shoulder. Patient is now experiencing pain in Rt shoulder with lifting and sleeping. Patient also reports Lt hip pain that is currently being treated with injection, tramadol , and prednisone.    See PMH or personal factors for comorbidities  PAIN:  Are you having pain? Yes: NPRS scale: 0/10 in shoulder  Pain location: Rt shoulder, bicep with reaching  Pain description: sharp Aggravating factors: sleeping, lifting, reaching Relieving factors: rest, injection  PRECAUTIONS: Fall and Other: cardiac  RED FLAGS: None   WEIGHT BEARING RESTRICTIONS: No  FALLS:  Has patient fallen in last 6 months? Yes. Number of falls 1; thought he was locked into the prosthesis and was not    LIVING ENVIRONMENT: Lives with: lives with their spouse Lives in: House/apartment Stairs: Yes: Internal: 12 steps; rail/wall and External: 1 to get in the  house from garage, 3 steps to patio steps; one rail on front, double to patio  Has following equipment at home: unable to assess on eval  OCCUPATION: retired, IT TRAINER  PLOF: Independent and Rt TTA prosthesis   PATIENT GOALS: get rid of the pain to lift and move things around  NEXT MD VISIT: 10/26/2023 for injection in Lt hip   OBJECTIVE:  Note: Objective measures were completed at Evaluation unless otherwise noted.  DIAGNOSTIC FINDINGS:  IMPRESSION: 1. Mild spurring of the left acetabulum and femoral head. 2. Mild proximal hamstring tendinopathy. 3. Trace nonspecific edema medially along the hip adductor musculature. 4. Contralateral (right) total hip prosthesis.  PATIENT SURVEYS:  PSFS: THE PATIENT SPECIFIC FUNCTIONAL SCALE  Place score of 0-10 (0 = unable to perform activity and 10 = able to perform activity at the same level as before injury or problem)  Activity Date: 10/25/2023 11/09/2023  11/21/2023   Raising right arm above shoulder  5 2 8   2. Carrying load with right arm  4 7 9   3. Sleeping on right side  4 7 9   4.      Total Score 4.33 5.33 8.66    Total Score = Sum of activity scores/number of activities  Minimally Detectable Change: 3 points (for single activity); 2 points (for average score)  [facility] (nd). The Patient Specific Functional Scale . Retrieved from [email or web address]   COGNITION: Overall cognitive status: Within functional limits for tasks assessed     SENSATION: Not formally assessed on eval  EDEMA:    MUSCLE LENGTH: Not formally assessed on eval secondary to focus on UE  POSTURE: rounded shoulders, forward head, and increased thoracic kyphosis  PALPATION: Not formally assessed on eval  LOWER EXTREMITY ROM: not assessed on eval secondary to Rt UE focus   ROM Right Eval 10/25/2023 Left Eval 10/25/2023  Hip flexion    Hip extension    Hip abduction    Hip adduction    Hip internal rotation    Hip external rotation    Knee flexion    Knee extension    Ankle dorsiflexion    Ankle plantarflexion    Ankle inversion    Ankle eversion     (Blank rows = not tested)  LOWER EXTREMITY MMT: not assessed on eval secondary to Rt UE focus   MMT Right eval Left eval  Hip flexion    Hip extension    Hip abduction    Hip adduction    Hip internal rotation    Hip external rotation    Knee flexion    Knee extension    Ankle dorsiflexion    Ankle plantarflexion    Ankle inversion    Ankle eversion     (Blank rows = not tested)  LOWER EXTREMITY SPECIAL TESTS:  Not formally assessed on eval   Upper Extremity ROM: 10/25/2023 SEATED , AROM Shoulder flexion: Rt: 80deg Lt: WFL Shoulder abduction: Rt: 72deg Lt: WFL Shoulder ER: Rt: L3 Lt: T3 Shoulder IR: Rt: T1 Lt: T10  11/21/2023 Seated, AROM Rt shoulder flexion: 130deg Rt shoulder abduction: 150deg   Upper  Extremity MMT:  SEATED ; Rt within available ROM  Shoulder flexion: Rt: 4+/5 Lt: 4+/5 Shoulder abduction: Rt:4+/5 Lt: 4+/5 Shoulder ER: Rt: 3+/5 Lt: 3+/5 Shoulder IR: Rt:3+/5  Lt: 4-/5  UE special tests: 10/25/2023 Full can: negative Empty can: postivie, tension and painful   FUNCTIONAL TESTS:  Not formally assessed on eval  GAIT: Distance walked: not formally  assessed  Assistive device utilized: Rt TTA prosthesis  Level of assistance: supervision Comments: Rt TTA prosthesis appears to be short                                                                                                                                TREATMENT DATE:  12/05/2023 TherEx:  Pulleys 3 min shoulder flexion, 3 min scaption Supine chest press 2x12 with 6# DBs Supine scap punches 2x10 with 6# DBs  PT discussed performing similar exercises as he was doing following original rotator cuff repair; patient acknowledged, agreed, and discussed with PT that he still has papers and bands   Neuro Re-Ed:  Standing Is, Ys, Ts 1x15 each  Standing rows with cable machine 2x15 with 35#  Supine with half foam roller on spine to stretch chest and biceps 1x3 min hold  Horizontal abduction with red TB 2x12 with 2s hold while on half foam roller   12/03/2023 TherEx:   Pulleys 3 min shoulder flexion, 3 min scaption Standing bicep curls with slow eccentrics 1x12 each direction with 5# DBs (hands in supine, neutral, prone)  Neuro Re-Ed:  Supine with half foam roller on spine to stretch chest and biceps 1x3 min hold  Horizontal abduction with red TB 2x12 with 2s hold while on half foam roller  Standing rows with cable machine 2x15 with 35# Standing straight arm pulldowns with cable machine 2x15 with 20#    11/28/2023 TherEx:   Pulleys 3 min shoulder flexion, 3 min scaption Tall kneeling thoracic extension with green theraball 3x30s  Seated horizontal abduction with red TB 2x10 with 2s hold   Neuro Re-Ed:   Standing rows with cable machine 2x12 with 35#  Standing Is, Ys, Ts 1x12 each    11/26/2023 TherAct:  Pulleys 3 min shoulder flexion, 3 min scaption Supine horizontal abduction with yellow TB 2x10 with 2s hold  while on half foam roller  Standing bicep curls in ER 1x12 with 3s eccentric focus   Neuro Re-Ed:  Supine with half foam roller on spine to stretch chest and biceps 1x3 min hold  Standing rows with cable machine 2x8 with 35#  Standing Is, Ys, Ts 1x12 each      PATIENT EDUCATION:  Education details: HEP, POC Person educated: Patient Education method: Explanation, Demonstration, Tactile cues, Verbal cues, and Handouts Education comprehension: verbalized understanding and returned demonstration  HOME EXERCISE PROGRAM: Access Code: [email or web address] URL: [email or web address] Date: 10/25/2023 Prepared by: Susannah Daring  Exercises - Seated Cervical Retraction  - 1 x daily - 7 x weekly - 2 sets - 10 reps - 3s hold - Seated Scapular Retraction  - 1 x daily - 7 x weekly - 2 sets - 10 reps - 3s hold - Supine Shoulder Flexion with Dowel  - 1 x daily - 7 x weekly - 2 sets - 10 reps - 3s hold - Supine Shoulder Abduction AAROM with Dowel  - 1 x  daily - 7 x weekly - 2 sets - 10 reps - 3s hold  ASSESSMENT:  CLINICAL IMPRESSION: Patient arrived to session noting no pain, soreness, or stiffness in Rt shoulder. Patient has made significant improvements since initial evaluation and is currently appropriate for discharge.   OBJECTIVE IMPAIRMENTS: Abnormal gait, decreased activity tolerance, decreased balance, decreased coordination, decreased mobility, difficulty walking, decreased ROM, decreased strength, impaired UE functional use, improper body mechanics, postural dysfunction, prosthetic dependency , and pain.   ACTIVITY LIMITATIONS: carrying, lifting, sleeping, bed mobility, and reach over head  PARTICIPATION LIMITATIONS: cleaning, community activity, and church  PERSONAL  FACTORS: Past/current experiences and 3+ comorbidities: hypothyroidism, HTN, HLD, GERD, A-fib (s/p ablation), DM2, arthritis, PVD, CAD are also affecting patient's functional outcome.   REHAB POTENTIAL: Good  CLINICAL DECISION MAKING: Evolving/moderate complexity  EVALUATION COMPLEXITY: Moderate   GOALS: Goals reviewed with patient? Yes  SHORT TERM GOALS: Target date: 11/08/2023 Patient will show compliance with initial HEP. Baseline: Goal status: GOAL MET, 11/09/2023  2.  Patient will report pain levels no greater than 6/10 in order to show improved overall quality of life. Baseline:  Goal status: GOAL MET, 11/21/2023    LONG TERM GOALS: Target date: 12/19/2023  Patient will be independent with final HEP in order to maintain and progress upon functional gains made within PT. Baseline:  Goal status: GOAL MET, 12/05/2023  2.  Patient will report pain levels no greater than 3/10 in order to show improved overall quality of life. Baseline:  Goal status: GOAL MET, 12/05/2023  3.  Patient will increase PSFS to at least 6.33 in order to show a significant improvement in subjective disability rating. Baseline:  Goal status: GOAL MET, 11/21/2023  4.  Patient will increase Rt shoulder flexion ROM to at least 110deg in order to improve functional mobility. Baseline:  Goal status: GOAL MET, 11/21/2023  5.  Patient will increase Rt shoulder abduction ROM to at least 95deg in order to improve functional mobility. Baseline:  Goal status: GOAL MET, 11/21/2023     PLAN:  PT FREQUENCY: 1-2x/week  PT DURATION: 4 weeks  PLANNED INTERVENTIONS: 97164- PT Re-evaluation, 97750- Physical Performance Testing, 97110-Therapeutic exercises, 97530- Therapeutic activity, [id number]- Neuromuscular re-education, 97535- Self Care, 02859- Manual therapy, [id number]- Gait training, [id number]- Prosthetic Initial , [id number]- Orthotic/Prosthetic subsequent, [phone]- Canalith repositioning, [id number]- Aquatic Therapy,  H9716- Electrical stimulation (unattended), [phone]- Electrical stimulation (manual), [id number]- Vasopneumatic device, [id number]- Ultrasound, [id number]- Traction (mechanical), [id number]- Ionotophoresis 4mg /ml Dexamethasone , 97597- Wound care (first 20 sq cm), 97598- Wound care (each additional 20 sq cm), 20560 (1-2 muscles), 20561 (3+ muscles)- Dry Needling, Patient/Family education, Balance training, Stair training, Taping, Joint mobilization, Joint manipulation, Spinal manipulation, Spinal mobilization, Scar mobilization, Compression bandaging, Vestibular training, DME instructions, Cryotherapy, and Moist heat  PLAN FOR NEXT SESSION:     PHYSICAL THERAPY DISCHARGE SUMMARY  Visits from Start of Care: 11  Current functional level related to goals / functional outcomes: See above    Remaining deficits: See above    Education / Equipment: HEP   Patient agrees to discharge. Patient goals were met. Patient is being discharged due to meeting the stated rehab goals.  Susannah Daring, PT, DPT 12/05/23 11:47 AM

## 2023-12-05 ENCOUNTER — Ambulatory Visit (INDEPENDENT_AMBULATORY_CARE_PROVIDER_SITE_OTHER)

## 2023-12-05 DIAGNOSIS — M25552 Pain in left hip: Secondary | ICD-10-CM | POA: Diagnosis not present

## 2023-12-05 DIAGNOSIS — M25611 Stiffness of right shoulder, not elsewhere classified: Secondary | ICD-10-CM | POA: Diagnosis not present

## 2023-12-05 DIAGNOSIS — M6281 Muscle weakness (generalized): Secondary | ICD-10-CM | POA: Diagnosis not present

## 2023-12-05 DIAGNOSIS — G8929 Other chronic pain: Secondary | ICD-10-CM

## 2023-12-05 DIAGNOSIS — M25511 Pain in right shoulder: Secondary | ICD-10-CM | POA: Diagnosis not present

## 2023-12-05 DIAGNOSIS — R2689 Other abnormalities of gait and mobility: Secondary | ICD-10-CM

## 2023-12-12 ENCOUNTER — Other Ambulatory Visit: Payer: Self-pay | Admitting: Family

## 2024-01-01 ENCOUNTER — Other Ambulatory Visit: Payer: Self-pay | Admitting: Thoracic Surgery (Cardiothoracic Vascular Surgery)

## 2024-01-01 DIAGNOSIS — I7121 Aneurysm of the ascending aorta, without rupture: Secondary | ICD-10-CM

## 2024-01-10 ENCOUNTER — Ambulatory Visit: Admitting: Cardiovascular Disease

## 2024-01-23 NOTE — Progress Notes (Unsigned)
" °  Electrophysiology Office Note:   Date:  01/23/2024  ID:  [name], DOB July 11, 1950, MRN [id number]  Primary Cardiologist: Oneil Parchment, MD Primary Heart Failure: None Electrophysiologist: Eulas FORBES Furbish, MD  {Click to update primary MD,subspecialty MD or APP then REFRESH:1}    History of Present Illness:   [name] is a 73 y.o. male with h/o AF, AFL, bradycardia, CAD s/p LAD PCI with stent / unable to stent RCA, HTN, DM, Roux-en-Y bypass seen today for routine electrophysiology follow-up s/p AF & AFL Ablation.  Since last being seen in our clinic the patient reports doing ***.    He ***denies chest pain, palpitations, dyspnea, PND, orthopnea, nausea, vomiting, dizziness, syncope, edema, weight gain, or early satiety.    Review of systems complete and found to be negative unless listed in HPI.   EP Information / Studies Reviewed:    EKG is ordered today. Personal review as below.       Arrhythmia / AAD / Pertinent EP Studies AF EPS 10/11/23 > SR on presentation, PVI ablation with PF, ablation of CTI for typical atrial flutter     Risk Assessment/Calculations:    CHA2DS2-VASc Score = 5  {Confirm score is correct.  If not, click here to update score.  REFRESH note.  :1} This indicates a 7.2% annual risk of stroke. The patient's score is based upon: CHF History: 0 HTN History: 1 Diabetes History: 1 Stroke History: 0 Vascular Disease History: 1 Age Score: 2 Gender Score: 0   {This patient has a significant risk of stroke if diagnosed with atrial fibrillation.  Please consider VKA or DOAC agent for anticoagulation if the bleeding risk is acceptable.   You can also use the SmartPhrase .[email or web address] for documentation.   :[id number]} No BP recorded.  {Refresh Note OR Click here to enter BP  :1}***        Physical Exam:   VS:  There were no vitals taken for this visit.   Wt Readings from Last 3 Encounters:  11/29/23 225 lb (102.1 kg)  11/08/23 216 lb 9.6 oz (98.2 kg)   10/11/23 225 lb (102.1 kg)     GEN: Well nourished, well developed in no acute distress NECK: No JVD; No carotid bruits CARDIAC: {EPRHYTHM:28826}, no murmurs, rubs, gallops RESPIRATORY:  Clear to auscultation without rales, wheezing or rhonchi  ABDOMEN: Soft, non-tender, non-distended EXTREMITIES:  No edema; No deformity   ASSESSMENT AND PLAN:    Paroxysmal Atrial Fibrillation  Atrial Flutter  CHA2DS2-VASc 5, s/p PVI + CTI ablation 09/2023 -EKG with ***  -OAC for stroke prophylaxis  -no symptom burden post ablation *** -monitors with ***  Secondary Hypercoagulable State  -continue Eliquis  5mg  BID, dose reviewed and appropriate by age / wt   Tachy-Brady Syndrome  Bifascicular Block  -avoid AV nodal blocking agents   CAD s/p LAD PCI with Stent  RCA disease, unable to stent  -per Cardiology   Hypertension  -well controlled on current regimen ***   Follow up with {EPMDS:28135::EP Team} {EPFOLLOW LE:71826}  Signed, Lucas Barrack, NP-C, AGACNP-BC [facility] - Electrophysiology  01/23/2024, 2:19 PM  "

## 2024-01-25 ENCOUNTER — Telehealth (HOSPITAL_BASED_OUTPATIENT_CLINIC_OR_DEPARTMENT_OTHER): Payer: Self-pay

## 2024-01-25 ENCOUNTER — Ambulatory Visit: Attending: Pulmonary Disease | Admitting: Pulmonary Disease

## 2024-01-25 ENCOUNTER — Encounter: Payer: Self-pay | Admitting: Pulmonary Disease

## 2024-01-25 VITALS — BP 134/72 | HR 53 | Ht >= 80 in | Wt 217.0 lb

## 2024-01-25 DIAGNOSIS — I10 Essential (primary) hypertension: Secondary | ICD-10-CM | POA: Diagnosis present

## 2024-01-25 DIAGNOSIS — I480 Paroxysmal atrial fibrillation: Secondary | ICD-10-CM | POA: Insufficient documentation

## 2024-01-25 DIAGNOSIS — I2510 Atherosclerotic heart disease of native coronary artery without angina pectoris: Secondary | ICD-10-CM | POA: Insufficient documentation

## 2024-01-25 DIAGNOSIS — I483 Typical atrial flutter: Secondary | ICD-10-CM | POA: Diagnosis present

## 2024-01-25 DIAGNOSIS — I4819 Other persistent atrial fibrillation: Secondary | ICD-10-CM | POA: Diagnosis present

## 2024-01-25 DIAGNOSIS — D6869 Other thrombophilia: Secondary | ICD-10-CM | POA: Insufficient documentation

## 2024-01-25 NOTE — Patient Instructions (Signed)
 Medication Instructions:  Your physician recommends that you continue on your current medications as directed. Please refer to the Current Medication list given to you today.  *If you need a refill on your cardiac medications before your next appointment, please call your pharmacy*  Lab Work: None  If you have labs (blood work) drawn today and your tests are completely normal, you will receive your results only by: MyChart Message (if you have MyChart) OR A paper copy in the mail If you have any lab test that is abnormal or we need to change your treatment, we will call you to review the results.  Testing/Procedures: None   Follow-Up: At [facility], you and your health needs are our priority.  As part of our continuing mission to provide you with exceptional heart care, our providers are all part of one team.  This team includes your primary Cardiologist (physician) and Advanced Practice Providers or APPs (Physician Assistants and Nurse Practitioners) who all work together to provide you with the care you need, when you need it.  Your next appointment:   6 month(s)  Provider:   Daphne Barrack, NP  We recommend signing up for the patient portal called MyChart.  Sign up information is provided on this After Visit Summary.  MyChart is used to connect with patients for Virtual Visits (Telemedicine).  Patients are able to view lab/test results, encounter notes, upcoming appointments, etc.  Non-urgent messages can be sent to your provider as well.   To learn more about what you can do with MyChart, go to [email or web address].   Other Instructions None

## 2024-01-25 NOTE — Telephone Encounter (Signed)
 Pharmacy please advise on holding eliquis  prior to Colon surgery for removal of tubular adenoma  scheduled for TBD. Thank you.   Last labs: 09/20/2023

## 2024-01-25 NOTE — Telephone Encounter (Signed)
"  ° °  Pre-operative Risk Assessment    Patient Name: [name]  DOB: Nov 09, 1950 MRN: [id number]   Date of last office visit: 01/25/24 with [facility] Date of next office visit: 01/30/24 with [facility]  Request for Surgical Clearance    Procedure:  Colon surgery for removal of tubular adenoma  Date of Surgery:  Clearance TBD                                 Surgeon:  Dr. Sheldon Surgeon's Group or Practice Name:  [facility]  Phone number:  [phone] Fax number:  [phone]   Type of Clearance Requested:   - Medical  - Pharmacy:  Hold Clopidogrel  (Plavix ) and Apixaban  (Eliquis ) not indicated    Type of Anesthesia:  General    Additional requests/questions:    Bonney Augustin JONETTA Delores   01/25/2024, 2:54 PM   "

## 2024-01-28 DIAGNOSIS — Z8546 Personal history of malignant neoplasm of prostate: Secondary | ICD-10-CM | POA: Insufficient documentation

## 2024-01-28 DIAGNOSIS — N529 Male erectile dysfunction, unspecified: Secondary | ICD-10-CM | POA: Insufficient documentation

## 2024-01-28 DIAGNOSIS — K219 Gastro-esophageal reflux disease without esophagitis: Secondary | ICD-10-CM | POA: Insufficient documentation

## 2024-01-28 DIAGNOSIS — E1149 Type 2 diabetes mellitus with other diabetic neurological complication: Secondary | ICD-10-CM | POA: Insufficient documentation

## 2024-01-28 DIAGNOSIS — Z9884 Bariatric surgery status: Secondary | ICD-10-CM | POA: Insufficient documentation

## 2024-01-28 DIAGNOSIS — I359 Nonrheumatic aortic valve disorder, unspecified: Secondary | ICD-10-CM | POA: Insufficient documentation

## 2024-01-28 NOTE — Telephone Encounter (Signed)
 Patient with diagnosis of A Fib on Eliquis  for anticoagulation. Ablation on 10/11/23.  Procedure: Colon surgery for removal of tubular adenoma  Date of procedure: TBD   CHA2DS2-VASc Score = 6  This indicates a 9.7% annual risk of stroke. The patient's score is based upon: CHF History: 1 HTN History: 1 Diabetes History: 1 Stroke History: 0 Vascular Disease History: 1 Age Score: 2 Gender Score: 0     CrCl 95 ml/min Platelet count 201k  Patient has not had an Afib/aflutter ablation in the last 3 months, DCCV within the last 4 weeks or a watchman implanted in the last 45 days    Per office protocol, patient can hold Eliquis  for 3 days prior to procedure.    **This guidance is not considered finalized until pre-operative APP has relayed final recommendations.**

## 2024-01-28 NOTE — Telephone Encounter (Signed)
"  ° °  Patient Name: [name]  DOB: [birth year] MRN: [id number]  Primary Cardiologist: Oneil Parchment, MD  Chart reviewed as part of pre-operative protocol coverage. Given past medical history and time since last visit, based on ACC/AHA guidelines, [name] is at acceptable risk for the planned procedure without further cardiovascular testing.   Mr. Lamountain perioperative risk of a major cardiac event is 6.6% according to the Revised Cardiac Risk Index (RCRI).  Therefore, he is at moderate risk for perioperative complications.   His functional capacity is good at 6.79 METs according to the Duke Activity Status Index (DASI).   Per office protocol, patient can hold Eliquis  for 3 days prior to procedure.    The patient was advised that if he develops new symptoms prior to surgery to contact our office to arrange for a follow-up visit, and he verbalized understanding.  I will route this recommendation to the requesting party via Epic fax function and remove from pre-op pool.  Please call with questions.  Lucas Satterfield, NP 01/28/2024, 10:49 AM  "

## 2024-01-29 ENCOUNTER — Ambulatory Visit: Payer: Self-pay | Admitting: Surgery

## 2024-01-30 ENCOUNTER — Ambulatory Visit: Admitting: Cardiology

## 2024-01-30 VITALS — BP 138/71 | HR 53 | Ht >= 80 in | Wt 219.2 lb

## 2024-01-30 DIAGNOSIS — I480 Paroxysmal atrial fibrillation: Secondary | ICD-10-CM | POA: Insufficient documentation

## 2024-01-30 DIAGNOSIS — I2510 Atherosclerotic heart disease of native coronary artery without angina pectoris: Secondary | ICD-10-CM | POA: Diagnosis present

## 2024-01-30 DIAGNOSIS — I453 Trifascicular block: Secondary | ICD-10-CM | POA: Diagnosis present

## 2024-01-30 DIAGNOSIS — I7121 Aneurysm of the ascending aorta, without rupture: Secondary | ICD-10-CM | POA: Insufficient documentation

## 2024-01-30 DIAGNOSIS — I4819 Other persistent atrial fibrillation: Secondary | ICD-10-CM | POA: Insufficient documentation

## 2024-01-30 DIAGNOSIS — I483 Typical atrial flutter: Secondary | ICD-10-CM | POA: Insufficient documentation

## 2024-01-30 DIAGNOSIS — I10 Essential (primary) hypertension: Secondary | ICD-10-CM | POA: Diagnosis present

## 2024-01-30 DIAGNOSIS — D6869 Other thrombophilia: Secondary | ICD-10-CM | POA: Insufficient documentation

## 2024-01-30 NOTE — Patient Instructions (Signed)
 Medication Instructions:   Your physician recommends that you continue on your current medications as directed. Please refer to the Current Medication list given to you today.  *If you need a refill on your cardiac medications before your next appointment, please call your pharmacy*    Follow-Up: At [facility], you and your health needs are our priority.  As part of our continuing mission to provide you with exceptional heart care, our providers are all part of one team.  This team includes your primary Cardiologist (physician) and Advanced Practice Providers or APPs (Physician Assistants and Nurse Practitioners) who all work together to provide you with the care you need, when you need it.  Your next appointment:   1 year(s)  Provider:   Dorothye Gathers, MD

## 2024-01-30 NOTE — Progress Notes (Signed)
 " Cardiology Office Note:  .   Date:  01/30/2024  ID:  [name], DOB Dec 11, 1950, MRN [id number] PCP: Dwight Trula SQUIBB, MD  [facility] Providers Cardiologist:  Oneil Parchment, MD Electrophysiologist:  Eulas FORBES Furbish, MD    History of Present Illness: [name] is a 74 y.o. male Discussed the use of AI scribe  History of Present Illness [name] is a 74 year old male with atrial fibrillation and coronary artery disease who presents for cardiovascular follow-up.  He has a history of atrial fibrillation and underwent ablation for typical atrial flutter on October 11, 2023. He monitors his heart rate using an Apple Watch and has not experienced skipped beats since the ablation. He also has tachy-brady syndrome and a first-degree AV block with bivascular block, avoiding AV nodal blocking agents. His heart rate occasionally drops below 40 during sleep.  He has coronary artery disease with a history of left anterior descending stent placement and right coronary artery disease that was previously unable to be stented. No chest pain or shortness of breath. He is on Eliquis  5 mg twice a day for chronic anticoagulation.  He has an aortic aneurysm being followed by cardiothoracic surgery. An echocardiogram in 2024 showed a normal ejection fraction of 65% with aortic ascending dilation of 44 mm. The last CT scan on September 25, 2023, showed the ascending thoracic aorta measuring up to 5 cm, with double oblique measurements of 47, 48 or 49 mm at the level of the main pulmonary artery. He is scheduled for a CT scan next week and a follow-up appointment later this month.  He has a history of a myocardial infarction and a leg amputation with a prosthetic leg. He is experiencing issues with the prosthetic socket, particularly at the lower end of the tibia, and is unsure if recent adjustments have resolved the problem.  He underwent Roux-en-Y gastric bypass in 2012 and subsequent surgery for excess  skin removal. Since the surgery, his heart rate has been in the fifties, which is normal for him.      Studies Reviewed: .        Results Radiology Chest CT angiography (09/25/2023): Ascending thoracic aorta measuring up to 50 mm (radiologist), double oblique measurements at level of main pulmonary artery 46 x 48 mm, additional measurement 47-48 mm; stable compared to prior studies. (Independently interpreted)  Diagnostic Transthoracic echocardiogram (2024): Normal left ventricular ejection fraction 65%, ascending aortic dilation 44 mm. Cardiac rhythm monitor (2023): Average heart rate 59 bpm, minimum heart rate 35 bpm during sleep. Risk Assessment/Calculations:    CHA2DS2-VASc Score = 6   This indicates a 9.7% annual risk of stroke. The patient's score is based upon: CHF History: 1 HTN History: 1 Diabetes History: 1 Stroke History: 0 Vascular Disease History: 1 Age Score: 2 Gender Score: 0            Physical Exam:   VS:  BP 138/71 (BP Location: Left Arm, Patient Position: Sitting, Cuff Size: Normal)   Pulse (!) 53   Ht 6' 8 (2.032 m)   Wt 219 lb 3.2 oz (99.4 kg)   SpO2 98%   BMI 24.08 kg/m    Wt Readings from Last 3 Encounters:  01/30/24 219 lb 3.2 oz (99.4 kg)  01/25/24 217 lb (98.4 kg)  11/29/23 225 lb (102.1 kg)    GEN: Well nourished, well developed in no acute distress, 6'8 NECK: No JVD; No carotid bruits CARDIAC: RRR,  no murmurs, no rubs, no gallops RESPIRATORY:  Clear to auscultation without rales, wheezing or rhonchi  ABDOMEN: Soft, non-tender, non-distended EXTREMITIES:  No edema; L BKA   ASSESSMENT AND PLAN: .    Assessment and Plan Assessment & Plan Paroxysmal atrial fibrillation and typical atrial flutter with conduction disease Status post ablation on September 18th, 2025, for typical atrial flutter. No current symptoms of skipped beats or palpitations. EP notes reviewed. Monitoring with Apple Watch shows occasional low heart rates during  sleep. No daytime symptoms warranting concern. Avoiding AV nodal blocking agents due to conduction disease. - Continue monitoring with Apple Watch for heart rate and symptoms. - Avoid AV nodal blocking agents.  Coronary artery disease with prior myocardial infarction and stent placement LAD stent placement and RCA disease not amenable to stenting. No current chest pain or shortness of breath. Cleared for colon surgery with temporary discontinuation of Eliquis . - Proceed with colon surgery with temporary discontinuation of Eliquis  for three days.  Ascending aortic aneurysm Measured at 46 by 48 mm at the level of the main PA. Stable since last measurement in September 2025. Monitoring by cardiothoracic surgery team. No immediate intervention required as measurements are below the threshold for surgical intervention. - Continue monitoring with CT scan and follow-up with cardiothoracic surgery team.  Hypercoagulable state due to persistent atrial fibrillation On chronic anticoagulation with Eliquis  5 mg BID. Cleared for temporary discontinuation of Eliquis  for upcoming colon surgery. - Continue Eliquis  5 mg BID except for temporary discontinuation for colon surgery.         Dispo: 1 yr  Signed, Oneil Parchment, MD  "

## 2024-02-01 ENCOUNTER — Ambulatory Visit: Admitting: Surgical

## 2024-02-04 ENCOUNTER — Ambulatory Visit: Admitting: Surgical

## 2024-02-04 DIAGNOSIS — M75101 Unspecified rotator cuff tear or rupture of right shoulder, not specified as traumatic: Secondary | ICD-10-CM

## 2024-02-06 ENCOUNTER — Ambulatory Visit (HOSPITAL_COMMUNITY)
Admission: RE | Admit: 2024-02-06 | Discharge: 2024-02-06 | Disposition: A | Discharge status: Home / Self Care | Source: Ambulatory Visit | Attending: Cardiology | Admitting: Cardiology

## 2024-02-06 DIAGNOSIS — I7121 Aneurysm of the ascending aorta, without rupture: Secondary | ICD-10-CM | POA: Insufficient documentation

## 2024-02-06 DIAGNOSIS — E1159 Type 2 diabetes mellitus with other circulatory complications: Secondary | ICD-10-CM | POA: Diagnosis present

## 2024-02-06 MED ORDER — IOHEXOL 350 MG/ML SOLN
75.0000 mL | Freq: Once | INTRAVENOUS | Status: AC | PRN
  Administered 2024-02-06: 75 mL via INTRAVENOUS

## 2024-02-07 LAB — POCT I-STAT CREATININE: Creatinine, Ser: 1.2 mg/dL (ref 0.61–1.24)

## 2024-02-10 ENCOUNTER — Encounter: Payer: Self-pay | Admitting: Surgical

## 2024-02-10 NOTE — Progress Notes (Signed)
 "  Follow-up Office Visit Note   Patient: [name]           Date of Birth: November 01, 1950           MRN: [id number] Visit Date: 02/04/2024 Requested by: Dwight Trula SQUIBB, MD [address] [place],  [place] [address] PCP: Dwight Trula SQUIBB, MD  Subjective: Chief Complaint  Patient presents with   Right Shoulder - Pain, Follow-up    HPI: [name] is a 74 y.o. male who returns to the office for follow-up visit.    Plan at last visit was: [name] is a 74 y.o. male who returns to the office for follow-up visit.  Plan from last visit was noted above in HPI.  They now return with continued right shoulder pain.  He is here to discuss potential surgical intervention for his right shoulder per Dr. Brion last note.  Has full-thickness supraspinatus tear with retraction to the apex of the humeral head as well as mild to moderate fatty atrophy of the supraspinatus muscle belly.  Currently, he is not able to come off of Eliquis  which precludes surgical intervention.  He is on both Eliquis  and Plavix .  We will plan for right glenohumeral injection today to give him some symptomatic relief and see him back in 6 weeks for clinical recheck with Dr. Addie and consideration of posting for surgery at that time depending on his blood thinner status.  Patient agreed with plan.  Glenohumeral injection administered under ultrasound guidance and patient tolerated procedure well without complication.  Follow-up in 6 weeks.   Since then, patient notes he had good relief from glenohumeral injection and states that his physical therapy with [facility] provided excellent relief of his shoulder pain.  They felt it was primarily bicep mediated and working on exercises for this has substantially decreased any symptoms he has from his shoulder.  Only has the occasional twinge of pain.  Does not have to take anything for shoulder pain whatsoever.  Finished physical therapy in December.  Has no difficulty with sleeping at  night.  Does have upcoming colon surgery.              ROS: All systems reviewed are negative as they relate to the chief complaint within the history of present illness.  Patient denies fevers or chills.  Assessment & Plan: Visit Diagnoses:  1. Tear of right supraspinatus tendon     Plan: [name] is a 74 y.o. male who returns to the office for follow-up visit.  Plan from last visit was noted above in HPI.  They now return with near 100% resolution of his shoulder pain.  Still has full-thickness tear with retracted supraspinatus tear and fatty atrophy of the supraspinatus muscle.  At this point however, he is compensating for the lack of supraspinatus quite well.  With lack of symptoms, we will plan for observation for now.  Shoulder surgery not really an option in the near future anyway with his upcoming colon surgery now that he is able to come off of Eliquis  for period of time.  May need to consider another injection in the future if he has any return of symptoms.  Ultimately, may need to consider shoulder replacement if he develops rotator cuff arthropathy in the future.  For now, he would like to forego any intervention for his shoulder.  Follow-up with the office as needed if symptoms return.  Follow-Up Instructions: No follow-ups on file.  Orders:  No orders of the defined types were placed in this encounter.  No orders of the defined types were placed in this encounter.     Procedures: No procedures performed   Clinical Data: No additional findings.  Objective: Vital Signs: There were no vitals taken for this visit.  Physical Exam:  Constitutional: Patient appears well-developed HEENT:  Head: Normocephalic Eyes:EOM are normal Neck: Normal range of motion Cardiovascular: Normal rate Pulmonary/chest: Effort normal Neurologic: Patient is alert Skin: Skin is warm Psychiatric: Patient has normal mood and affect  Ortho Exam: Ortho exam demonstrates right shoulder with  75 degrees X rotation, 90 degrees abduction, 110 degrees forward elevation passively actively.  Intact infra and subscap strength rated 5/5.  Has active motion equivalent to passive motion.  No Popeye deformity.  Does have some minimal tenderness over the bicipital groove.  No AC joint tenderness.  Mild crepitus noted with passive motion of the shoulder.  Specialty Comments:  No specialty comments available.  Imaging: No results found.   PMFS History: Patient Active Problem List   Diagnosis Date Noted   Aortic valve disorder 01/28/2024   Gastro-esophageal reflux disease without esophagitis 01/28/2024   Type 2 diabetes mellitus with other diabetic neurological complication (HCC) 01/28/2024   Erectile dysfunction 01/28/2024   History of malignant neoplasm of prostate 01/28/2024   History of bariatric surgery 01/28/2024   Atrial flutter (HCC) 06/27/2023   Coronary stent patent 09/18/2022   Paroxysmal atrial fibrillation with RVR (HCC) 08/23/2022   Ascending aortic aneurysm 08/23/2022   Bradycardia 08/22/2022   NSTEMI (non-ST elevated myocardial infarction) (HCC) 08/22/2022   Atrial fibrillation with RVR (HCC) 08/22/2022   Hypotension 01/07/2021   S/P arthroscopy of right shoulder 06/02/2020   Nontraumatic complete tear of right rotator cuff 02/26/2020   Dry eye syndrome of bilateral lacrimal glands 12/23/2016   History of below-knee amputation of right lower extremity (HCC) 09/30/2016   S/P transmetatarsal amputation of foot, left (HCC) 04/25/2016   Chronic diastolic CHF (congestive heart failure) (HCC) 09/25/2015   Charcot's arthropathy associated with type 2 diabetes mellitus (HCC) 09/07/2015   History of laser assisted in situ keratomileusis 01/12/2015   Type 2 diabetes mellitus with foot ulcer (HCC) 10/30/2014   Benign prostatic hyperplasia with urinary retention 10/22/2014   Aortic stenosis 08/04/2014   Charcot foot due to diabetes mellitus (HCC) 07/13/2014   Diabetic foot  infection (HCC) 06/03/2014   Peripheral vascular disease 06/03/2014   Essential hypertension 06/01/2014   Diabetic neuropathy (HCC) 06/01/2014   Coronary artery disease involving native coronary artery of native heart without angina pectoris 07/29/2013   Trifascicular block 07/29/2013   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 remote stenting to ramus, prox LAD x2.   Dental crowns present    Diabetes mellitus    First degree AV block    GERD (gastroesophageal reflux disease)    Heart attack (HCC) 06/2004   Heart murmur    aortic   Hx MRSA infection 02/2006   Hyperlipidemia    Hypertension    hx. of - has not been on med. since losing wt. after gastric  bypass   Hypothyroidism    Morbid obesity (HCC)    Mucoid cyst of joint 09/2011   left thumb   Sleep apnea    sleep study 03/29/2011; no CPAP use, lost >130lbs   Trifascicular block     Family History  Problem Relation Age of Onset   Heart disease Mother     Past Surgical History:  Procedure Laterality Date   AMPUTATION Left 04/19/2016   Procedure: Left Foot 4th Toe Amputation vs. Transmetatarsal;  Surgeon: Jerona Harden GAILS, MD;  Location: [facility] OR;  Service: Orthopedics;  Laterality: Left;   ATRIAL FIBRILLATION ABLATION N/A 10/11/2023   Procedure: ATRIAL FIBRILLATION ABLATION;  Surgeon: Nancey Eulas BRAVO, MD;  Location: [facility];  Service: Cardiovascular;  Laterality: N/A;   BIOPSY  04/07/2019   Procedure: BIOPSY;  Surgeon: Elicia Claw, MD;  Location: [facility];  Service: Gastroenterology;;   CATARACT EXTRACTION  02/2010; 03/2010   COLONOSCOPY WITH PROPOFOL  N/A 09/14/2014   Procedure:  COLONOSCOPY WITH PROPOFOL ;  Surgeon: Gladis MARLA Louder, MD;  Location: [facility];  Service: Endoscopy;  Laterality: N/A;   COLONOSCOPY WITH PROPOFOL  N/A 04/07/2019   Procedure: COLONOSCOPY WITH PROPOFOL ;  Surgeon: Elicia Claw, MD;  Location: [facility];  Service: Gastroenterology;  Laterality: N/A;   CORONARY ANGIOPLASTY WITH STENT PLACEMENT  07/12/2004; 08/03/2004   total of 3 stents   CORONARY STENT INTERVENTION N/A 08/23/2022   Procedure: CORONARY STENT INTERVENTION;  Surgeon: Jordan, Peter M, MD;  Location: [facility] INVASIVE CV LAB;  Service: Cardiovascular;  Laterality: N/A;   I & D EXTREMITY Right 06/04/2014   Procedure: IRRIGATION AND DEBRIDEMENT EXTREMITY;  Surgeon: Lonni CINDERELLA Poli, MD;  Location: [facility] OR;  Service: Orthopedics;  Laterality: Right;   LEFT HEART CATH AND CORONARY ANGIOGRAPHY N/A 08/23/2022   Procedure: LEFT HEART CATH AND CORONARY ANGIOGRAPHY;  Surgeon: Jordan, Peter M, MD;  Location: [facility] INVASIVE CV LAB;  Service: Cardiovascular;  Laterality: N/A;   MASS EXCISION  10/04/2011   Procedure: EXCISION MASS;  Surgeon: Arley JONELLE Curia, MD;  Location: [facility];  Service: Orthopedics;  Laterality: Left;  excision cyst debridment ip joint of left thumb   PROSTATECTOMY     right below knee amputation  2018   ROUX-EN-Y GASTRIC BYPASS  10/10/2010   laparoscopic   SHOULDER ARTHROSCOPY WITH ROTATOR CUFF REPAIR AND SUBACROMIAL DECOMPRESSION Right 03/23/2020   Procedure: RIGHT SHOULDER ARTHROSCOPY WITH DEBRIDEMENT AND  ROTATOR CUFF REPAIR;  Surgeon: Poli Lonni CINDERELLA, MD;  Location: [facility] OR;  Service: Orthopedics;  Laterality: Right;   TOE AMPUTATION  02/23/2006   left foot second ray amputation   TOTAL HIP ARTHROPLASTY Right 03/29/2020   Procedure: TOTAL HIP ARTHROPLASTY POSTERIOR APPROACH;  Surgeon: Jerri Kay HERO, MD;  Location: [facility] OR;  Service: Orthopedics;  Laterality: Right;   TRANSMETATARSAL AMPUTATION Left    TRIGGER FINGER RELEASE Right 09/16/2019   Procedure:  RELEASE TRIGGER FINGER/A-1 PULLEY;  Surgeon: Curia Arley, MD;  Location: [facility];  Service: Orthopedics;  Laterality: Right;  IV REGIONAL FOREARM BLOCK   Social History   Occupational History   Occupation: IT TRAINER taxes  Tobacco Use   Smoking status: Never   Smokeless tobacco: Never  Substance and Sexual Activity   Alcohol use: No   Drug use: No   Sexual activity: Yes        "

## 2024-02-18 ENCOUNTER — Other Ambulatory Visit: Payer: Self-pay | Admitting: Cardiology

## 2024-02-19 ENCOUNTER — Ambulatory Visit

## 2024-02-26 ENCOUNTER — Ambulatory Visit

## 2024-02-26 VITALS — BP 136/70 | HR 63 | Resp 18 | Ht >= 80 in | Wt 226.0 lb

## 2024-02-26 DIAGNOSIS — I7121 Aneurysm of the ascending aorta, without rupture: Secondary | ICD-10-CM

## 2024-02-26 NOTE — Patient Instructions (Signed)
 SURGICAL WAITING ROOM VISITATION  Patients having surgery or a procedure may have no more than 2 support people in the waiting area - these visitors may rotate.    Children ages 52 and under will not be able to visit patients in [facility] under most circumstances.   Visitors with respiratory illnesses are discouraged from visiting and should remain at home.  If the patient needs to stay at the hospital during part of their recovery, the visitor guidelines for inpatient rooms apply. Pre-op nurse will coordinate an appropriate time for 1 support person to accompany patient in pre-op.  This support person may not rotate.    Please refer to the [facility] website for the visitor guidelines for Inpatients (after your surgery is over and you are in a regular room).       Your procedure is scheduled on: 03/07/24   Report to [facility] Main Entrance    Report to admitting at : 12:00 PM   Call this number if you have problems the morning of surgery [phone]   Clear liquids starting the day before surgery until : 11:00 AM DAY OF SURGERY  Water  Non-Citrus Juices (without pulp, NO RED-Apple, White grape, White cranberry) Black Coffee (NO MILK/CREAM OR CREAMERS, sugar ok)  Clear Tea (NO MILK/CREAM OR CREAMERS, sugar ok) regular and decaf                             Plain Jell-O (NO RED)                                           Fruit ices (not with fruit pulp, NO RED)                                     Popsicles (NO RED)                                                               Sports drinks like Gatorade (NO RED)              Drink 2 G2 drinks AT 10:00 PM the night before surgery.        The day of surgery:  Drink ONE (1) Pre-Surgery Clear G2 at : 11:00 AM the morning of surgery. Drink in one sitting. Do not sip.  This drink was given to you during your hospital  pre-op appointment visit. Nothing else to drink after completing the  Pre-Surgery Clear Ensure  or G2.          If you have questions, please contact your surgeons office.  FOLLOW BOWEL PREP AND ANY ADDITIONAL PRE OP INSTRUCTIONS YOU RECEIVED FROM YOUR SURGEON'S OFFICE!!!    Oral Hygiene is also important to reduce your risk of infection.                                    Remember - BRUSH YOUR TEETH THE MORNING OF SURGERY WITH YOUR REGULAR TOOTHPASTE  DENTURES WILL BE REMOVED PRIOR TO SURGERY PLEASE DO NOT APPLY Poly grip OR ADHESIVES!!!   Do NOT smoke after Midnight   Stop all vitamins and herbal supplements 7 days before surgery.   Take these medicines the morning of surgery with A SIP OF WATER : levothyroxine .Use eye drops as usual.Gabapentin  as needed.  DO NOT TAKE ANY ORAL DIABETIC MEDICATIONS DAY OF YOUR SURGERY    Before surgery.Stop taking ___________on __________as instructed by _____________.  Stop taking ____________as directed by your Surgeon/Cardiologist.  Contact your Surgeon/Cardiologist for instructions on Anticoagulant Therapy prior to surgery.                              You may not have any metal on your body including hair pins, jewelry, and body piercing             Do not wear lotions, powders, perfumes/cologne, or deodorant              Men may shave face and neck.   Do not bring valuables to the hospital. [facility] IS NOT             RESPONSIBLE   FOR VALUABLES.   Contacts, glasses, dentures or bridgework may not be worn into surgery.   Bring small overnight bag day of surgery.   DO NOT BRING YOUR HOME MEDICATIONS TO THE HOSPITAL. PHARMACY WILL DISPENSE MEDICATIONS LISTED ON YOUR MEDICATION LIST TO YOU DURING YOUR ADMISSION IN THE HOSPITAL!    Patients discharged on the day of surgery will not be allowed to drive home.  Someone NEEDS to stay with you for the first 24 hours after anesthesia.   Special Instructions: Bring a copy of your healthcare power of attorney and living will documents the day of surgery if you haven't scanned them  before.              Please read over the following fact sheets you were given: IF YOU HAVE QUESTIONS ABOUT YOUR PRE-OP INSTRUCTIONS PLEASE CALL [phone].   If you received a COVID test during your pre-op visit  it is requested that you wear a mask when out in public, stay away from anyone that may not be feeling well and notify your surgeon if you develop symptoms. If you test positive for Covid or have been in contact with anyone that has tested positive in the last 10 days please notify you surgeon.    [place] - Preparing for Surgery Before surgery, you can play an important role.  Because skin is not sterile, your skin needs to be as free of germs as possible.  You can reduce the number of germs on your skin by washing with CHG (chlorahexidine gluconate) soap before surgery.  CHG is an antiseptic cleaner which kills germs and bonds with the skin to continue killing germs even after washing. Please DO NOT use if you have an allergy to CHG or antibacterial soaps.  If your skin becomes reddened/irritated stop using the CHG and inform your nurse when you arrive at Short Stay. Do not shave (including legs and underarms) for at least 48 hours prior to the first CHG shower.  You may shave your face/neck.  Please follow these instructions carefully:  1.  Shower with CHG Soap the night before surgery ONLY (DO NOT USE THE SOAP THE MORNING OF SURGERY).  2.  If you choose to wash your hair, wash your hair first as usual  with your normal  shampoo.  3.  After you shampoo, rinse your hair and body thoroughly to remove the shampoo.                             4.  Use CHG as you would any other liquid soap.  You can apply chg directly to the skin and wash.  Gently with a scrungie or clean washcloth.  5.  Apply the CHG Soap to your body ONLY FROM THE NECK DOWN.   Do   not use on face/ open                           Wound or open sores. Avoid contact with eyes, ears mouth and   genitals (private parts).                        Wash face,  Genitals (private parts) with your normal soap.             6.  Wash thoroughly, paying special attention to the area where your    surgery  will be performed.  7.  Thoroughly rinse your body with warm water  from the neck down.  8.  DO NOT shower/wash with your normal soap after using and rinsing off the CHG Soap.                9.  Pat yourself dry with a clean towel.            10.  Wear clean pajamas.            11.  Place clean sheets on your bed the night of your first shower and do not  sleep with pets. Day of Surgery : Do not apply any CHG, lotions/deodorants the morning of surgery.  Please wear clean clothes to the hospital/surgery center.  FAILURE TO FOLLOW THESE INSTRUCTIONS MAY RESULT IN THE CANCELLATION OF YOUR SURGERY  PATIENT SIGNATURE_________________________________  NURSE SIGNATURE__________________________________  ________________________________________________________________________  Nasario Exon  An incentive spirometer is a tool that can help keep your lungs clear and active. This tool measures how well you are filling your lungs with each breath. Taking long deep breaths may help reverse or decrease the chance of developing breathing (pulmonary) problems (especially infection) following: A long period of time when you are unable to move or be active. BEFORE THE PROCEDURE  If the spirometer includes an indicator to show your best effort, your nurse or respiratory therapist will set it to a desired goal. If possible, sit up straight or lean slightly forward. Try not to slouch. Hold the incentive spirometer in an upright position. INSTRUCTIONS FOR USE  Sit on the edge of your bed if possible, or sit up as far as you can in bed or on a chair. Hold the incentive spirometer in an upright position. Breathe out normally. Place the mouthpiece in your mouth and seal your lips tightly around it. Breathe in slowly and as deeply as  possible, raising the piston or the ball toward the top of the column. Hold your breath for 3-5 seconds or for as long as possible. Allow the piston or ball to fall to the bottom of the column. Remove the mouthpiece from your mouth and breathe out normally. Rest for a few seconds and repeat Steps 1 through 7 at least 10 times every 1-2 hours when you  are awake. Take your time and take a few normal breaths between deep breaths. The spirometer may include an indicator to show your best effort. Use the indicator as a goal to work toward during each repetition. After each set of 10 deep breaths, practice coughing to be sure your lungs are clear. If you have an incision (the cut made at the time of surgery), support your incision when coughing by placing a pillow or rolled up towels firmly against it. Once you are able to get out of bed, walk around indoors and cough well. You may stop using the incentive spirometer when instructed by your caregiver.  RISKS AND COMPLICATIONS Take your time so you do not get dizzy or light-headed. If you are in pain, you may need to take or ask for pain medication before doing incentive spirometry. It is harder to take a deep breath if you are having pain. AFTER USE Rest and breathe slowly and easily. It can be helpful to keep track of a log of your progress. Your caregiver can provide you with a simple table to help with this. If you are using the spirometer at home, follow these instructions: SEEK MEDICAL CARE IF:  You are having difficultly using the spirometer. You have trouble using the spirometer as often as instructed. Your pain medication is not giving enough relief while using the spirometer. You develop fever of 100.5 F (38.1 C) or higher. SEEK IMMEDIATE MEDICAL CARE IF:  You cough up bloody sputum that had not been present before. You develop fever of 102 F (38.9 C) or greater. You develop worsening pain at or near the incision site. MAKE SURE YOU:   Understand these instructions. Will watch your condition. Will get help right away if you are not doing well or get worse. Document Released: 05/22/2006 Document Revised: 04/03/2011 Document Reviewed: 07/23/2006 [facility] Patient Information 2014 [place], [place].   ________________________________________________________________________

## 2024-02-27 ENCOUNTER — Other Ambulatory Visit: Payer: Self-pay

## 2024-02-27 ENCOUNTER — Encounter (HOSPITAL_COMMUNITY)
Admission: RE | Admit: 2024-02-27 | Discharge: 2024-02-27 | Disposition: A | Source: Ambulatory Visit | Attending: Surgery

## 2024-02-27 ENCOUNTER — Encounter (HOSPITAL_COMMUNITY): Payer: Self-pay

## 2024-02-27 VITALS — BP 154/78 | HR 56 | Temp 97.7°F | Ht >= 80 in | Wt 218.0 lb

## 2024-02-27 DIAGNOSIS — I10 Essential (primary) hypertension: Secondary | ICD-10-CM | POA: Insufficient documentation

## 2024-02-27 DIAGNOSIS — Z01812 Encounter for preprocedural laboratory examination: Secondary | ICD-10-CM | POA: Insufficient documentation

## 2024-02-27 DIAGNOSIS — Z01818 Encounter for other preprocedural examination: Secondary | ICD-10-CM

## 2024-02-27 DIAGNOSIS — E1149 Type 2 diabetes mellitus with other diabetic neurological complication: Secondary | ICD-10-CM | POA: Insufficient documentation

## 2024-02-27 LAB — CBC
HCT: 42.9 % (ref 39.0–52.0)
Hemoglobin: 14 g/dL (ref 13.0–17.0)
MCH: 31.3 pg (ref 26.0–34.0)
MCHC: 32.6 g/dL (ref 30.0–36.0)
MCV: 96 fL (ref 80.0–100.0)
Platelets: 214 10*3/uL (ref 150–400)
RBC: 4.47 MIL/uL (ref 4.22–5.81)
RDW: 13 % (ref 11.5–15.5)
WBC: 7.1 10*3/uL (ref 4.0–10.5)
nRBC: 0 % (ref 0.0–0.2)

## 2024-02-27 LAB — HEMOGLOBIN A1C
Hgb A1c MFr Bld: 6.5 % — ABNORMAL HIGH (ref 4.8–5.6)
Mean Plasma Glucose: 139.85 mg/dL

## 2024-02-27 LAB — TYPE AND SCREEN
ABO/RH(D): O POS
Antibody Screen: NEGATIVE

## 2024-02-27 LAB — GLUCOSE, CAPILLARY: Glucose-Capillary: 112 mg/dL — ABNORMAL HIGH (ref 70–99)

## 2024-02-27 LAB — BASIC METABOLIC PANEL WITH GFR
Anion gap: 11 (ref 5–15)
BUN: 16 mg/dL (ref 8–23)
CO2: 27 mmol/L (ref 22–32)
Calcium: 9.9 mg/dL (ref 8.9–10.3)
Chloride: 103 mmol/L (ref 98–111)
Creatinine, Ser: 1.09 mg/dL (ref 0.61–1.24)
GFR, Estimated: >60 mL/min
Glucose, Bld: 110 mg/dL — ABNORMAL HIGH (ref 70–99)
Potassium: 4.4 mmol/L (ref 3.5–5.1)
Sodium: 141 mmol/L (ref 135–145)

## 2024-02-27 NOTE — Patient Instructions (Signed)

## 2024-02-27 NOTE — Progress Notes (Signed)
 For Anesthesia: PCP - Dwight Trula SQUIBB, MD  Cardiologist - Jeffrie Oneil BROCKS, MD  Mealor, Eulas BRAVO, MD : Electrophysiology   Bowel Prep reminder: Reviewed  Chest x-ray - 06/27/23 Chest CT: 09/25/23. CT angio: 02/06/24 EKG - 02/24/24 Stress Test -  ECHO - 08/23/22 Cardiac Cath - 08/23/22 Pacemaker/ICD device last checked: Pacemaker orders received: Device Rep notified:  Spinal Cord Stimulator:N/A  Sleep Study - Yes CPAP - NO  Fasting Blood Sugar - 130's - 140's Checks Blood Sugar ___1__ times a day Date and result of last Hgb A1c-  Last dose of GLP1 agonist- N/A GLP1 instructions: Hold 7 days prior to schedule (Hold 24 hours-daily)   Last dose of SGLT-2 inhibitors- N/A SGLT-2 instructions: Hold 72 hours prior to surgery  Blood Thinner Instructions: Eliquis  will be on hold after: 03/02/24.  PlavixL will be on hold after: 03/01/24 Last Dose: Time last taken:  Aspirin  Instructions: Last Dose: Time last taken:  Activity level: Can go up a flight of stairs and activities of daily living without stopping and without chest pain and/or shortness of breath   Able to exercise without chest pain and/or shortness of breath  Anesthesia review: Hx: HTN,Heart attack,CAD,Afib,Ablation: 10/11/23.,OSA(NO CPAP)  Patient denies shortness of breath, fever, cough and chest pain at PAT appointment   Patient verbalized understanding of instructions that were reviewed over the telephone.

## 2024-02-29 ENCOUNTER — Encounter (HOSPITAL_COMMUNITY): Payer: Self-pay

## 2024-02-29 NOTE — Progress Notes (Signed)
 " Case: [id number] Date/Time: 03/07/24 1401   Procedure: COLECTOMY, PARTIAL, ROBOT-ASSISTED, LAPAROSCOPIC (Right) - ROBOTIC COLECTOMY PROXIMAL RIGHT WITH ANASTOMOSIS   Anesthesia type: General   Pre-op diagnosis: COLON POLYP UNRESECTABLE BY COLONOSCOPY   [facility] ROOM 02 / [facility]   Surgeons: Sheldon Standing, MD       DISCUSSION: [name] is a 74 yo male with PMH of HTN, CAD (NSTEMI, s/p DES proximal Ramus & DES proximal LAD 07/12/04, DES distal RCA 08/03/04, DES to LAD 07/2022), trifascicular block (1st degree, RBBB, LAFB),  A.fib s/p ablation (09/2023), TAA (5.0 cm), OSA (no CPAP after weight loss), GERD, s/p gastric bypass (2012), DM (A1c 6.5), hypothyroid, arthritis, s/p R BKA (2018) 2/2 osteomyelitis, prostate cancer s/p prostatectomy 12/2014.  Patient follows with Cardiology for CAD and PAF/flutter. He underwent ablation recently in 09/2023.Per Dr. Jeffrie he  also has tachy-brady syndrome and a first-degree AV block with bivascular block, avoiding AV nodal blocking agents. His heart rate occasionally drops below 40 during sleep. Last seen by Dr. Jeffrie on 01/30/24. Stable at that visit. Cleared for surgery:  LAD stent placement and RCA disease not amenable to stenting. No current chest pain or shortness of breath. Cleared for colon surgery with temporary discontinuation of Eliquis . - Proceed with colon surgery with temporary discontinuation of Eliquis  for three days.  Followed by CT surgery for TAA and last seen on 02/26/24 by PA Fenyak. Aneurysm has remained stable in size and measured 5.0 cm on recent CT scan of chest in Jan 2026.  Aortic root measured 4.2 cm. He was advised to f/u in 6 months.  LD Plavix : 2/7 LD Eliquis : 2/8  VS: BP (!) 154/78   Pulse (!) 56   Temp 36.5 C (Oral)   Ht 6' 8 (2.032 m)   Wt 98.9 kg   SpO2 100%   BMI 23.95 kg/m   PROVIDERS: Dwight Trula SQUIBB, MD   LABS: Labs reviewed: Acceptable for surgery. (all labs ordered are listed, but only abnormal results are  displayed)  Labs Reviewed  HEMOGLOBIN A1C - Abnormal; Notable for the following components:      Result Value   Hgb A1c MFr Bld 6.5 (*)    All other components within normal limits  BASIC METABOLIC PANEL WITH GFR - Abnormal; Notable for the following components:   Glucose, Bld 110 (*)    All other components within normal limits  GLUCOSE, CAPILLARY - Abnormal; Notable for the following components:   Glucose-Capillary 112 (*)    All other components within normal limits  CBC  TYPE AND SCREEN     CTA Chest 02/06/24:  IMPRESSION: 1. Stable fusiform aneurysm of the ascending thoracic aorta measuring up to 5.0 cm. No significant change since 08/14/2023. Recommend semi-annual imaging followup by CTA or MRA. This recommendation follows 2010 ACCF/AHA/AATS/ACR/ASA/SCA/SCAI/SIR/STS/SVM Guidelines for the Diagnosis and Management of Patients With Thoracic Aortic Disease. Circulation. 2010; 121: Z733-z630. Aortic aneurysm NOS (ICD10-I71.9) 2. No acute chest abnormality. Please note that the entire upper chest was not imaged with this examination. 3. Evidence of old granulomatous disease. 4. Hiatal hernia associated with a gastric bypass. 5.  Aortic Atherosclerosis (ICD10-I70.0).  EKG 01/25/24:  SR with sinus arrhythmia and 1st degree block Bifasicular block   LHC 08/23/2022:    Prox LAD-1 lesion is 90% stenosed.   Prox RCA-1 lesion is 99% stenosed.   Prox RCA-2 lesion is 99% stenosed.   Previously placed Prox LAD-2 stent of unknown type is  widely patent.   Previously  placed Ramus stent of unknown type is  widely patent.   Previously placed Dist RCA stent of unknown type is  widely patent.   A drug-eluting stent was successfully placed using a SYNERGY XD [id number].   Post intervention, there is a 0% residual stenosis.   Post intervention, there is a 0% residual stenosis.   Post intervention, there is a 99% residual stenosis.   Post intervention, there is a 99% residual stenosis.    LV end diastolic pressure is normal.   Recommend to resume Apixaban , at currently prescribed dose and frequency on 08/24/2022.   Recommend concurrent antiplatelet therapy of Aspirin  81 mg for 1 month and Clopidogrel  75mg  daily for 12 months .   Severe 2 vessel obstructive CAD. 90% proximal LAD stenosis just proximal to and in the very proximal portion of the prior stent. Tandem 99% stenoses in the proximal and mid RCA. Distal RCA and ramus intermediate stents are patent Normal LVEDP Successful PCI of the proximal LAD with DES x 1 overlapping with prior stent Unsuccessful PCI of the RCA due to inability to cross the second lesion with a wire.   Plan: DAPT with ASA for 1-4 weeks, plavix  for 12 months. OK to start anticoagulation with Eliquis  in am. Further management of arrhythmia per rounding team. If needed I think the RCA could be addressed at a later date using a CTO approach.   Echo 08/23/2022:  IMPRESSIONS    1. Left ventricular ejection fraction, by estimation, is 60 to 65%. The left ventricle has normal function. The left ventricle has no regional wall motion abnormalities. Left ventricular diastolic parameters are consistent with Grade I diastolic dysfunction (impaired relaxation).  2. Right ventricular systolic function is normal. The right ventricular size is mildly enlarged.  3. The mitral valve is normal in structure. No evidence of mitral valve regurgitation. No evidence of mitral stenosis.  4. The aortic valve is calcified. There is mild calcification of the aortic valve. There is mild thickening of the aortic valve. Aortic valve regurgitation is trivial. Aortic valve sclerosis is present, with no evidence of aortic valve stenosis.  5. Aortic dilatation noted. There is mild dilatation of the aortic root, measuring 41 mm. There is mild dilatation of the ascending aorta, measuring 44 mm.  6. The inferior vena cava is normal in size with greater than 50% respiratory  variability, suggesting right atrial pressure of 3 mmHg. Past Medical History:  Diagnosis Date   Anemia    low iron   Arthritis    Coronary artery disease 2006   a. remote stenting to ramus, prox LAD x2.   Dental crowns present    Diabetes mellitus    First degree AV block    GERD (gastroesophageal reflux disease)    Heart attack (HCC) 06/2004   Heart murmur    aortic   Hx MRSA infection 02/2006   Hyperlipidemia    Hypertension    hx. of - has not been on med. since losing wt. after gastric bypass   Hypothyroidism    Morbid obesity (HCC)    Mucoid cyst of joint 09/2011   left thumb   Sleep apnea    sleep study 03/29/2011; no CPAP use, lost >130lbs   Trifascicular block     Past Surgical History:  Procedure Laterality Date   AMPUTATION Left 04/19/2016   Procedure: Left Foot 4th Toe Amputation vs. Transmetatarsal;  Surgeon: Jerona Harden GAILS, MD;  [facility];  Service: Orthopedics;  Laterality: Left;  ATRIAL FIBRILLATION ABLATION N/A 10/11/2023   Procedure: ATRIAL FIBRILLATION ABLATION;  Surgeon: Nancey Eulas BRAVO, MD;  Location: [facility] INVASIVE CV LAB;  Service: Cardiovascular;  Laterality: N/A;   BIOPSY  04/07/2019   Procedure: BIOPSY;  Surgeon: Elicia Claw, MD;  [facility];  Service: Gastroenterology;;   CATARACT EXTRACTION  02/2010; 03/2010   CATARACT EXTRACTION, BILATERAL Bilateral    COLONOSCOPY WITH PROPOFOL  N/A 09/14/2014   Procedure: COLONOSCOPY WITH PROPOFOL ;  Surgeon: Gladis MARLA Louder, MD;  [facility];  Service: Endoscopy;  Laterality: N/A;   COLONOSCOPY WITH PROPOFOL  N/A 04/07/2019   Procedure: COLONOSCOPY WITH PROPOFOL ;  Surgeon: Elicia Claw, MD;  [facility];  Service: Gastroenterology;  Laterality: N/A;   CORONARY ANGIOPLASTY WITH STENT PLACEMENT  07/12/2004; 08/03/2004   total of 3 stents   CORONARY STENT INTERVENTION N/A 08/23/2022   Procedure: CORONARY STENT INTERVENTION;  Surgeon: Jordan, Peter M, MD;  [facility]  INVASIVE CV LAB;  Service: Cardiovascular;  Laterality: N/A;   I & D EXTREMITY Right 06/04/2014   Procedure: IRRIGATION AND DEBRIDEMENT EXTREMITY;  Surgeon: Lonni CINDERELLA Poli, MD;  Location[facility] OR;  Service: Orthopedics;  Laterality: Right;   LEFT HEART CATH AND CORONARY ANGIOGRAPHY N/A 08/23/2022   Procedure: LEFT HEART CATH AND CORONARY ANGIOGRAPHY;  Surgeon: Jordan, Peter M, MD;  Location: [facility] INVASIVE CV LAB;  Service: Cardiovascular;  Laterality: N/A;   MASS EXCISION  10/04/2011   Procedure: EXCISION MASS;  Surgeon: Arley JONELLE Curia, MD;  Location: [facility];  Service: Orthopedics;  Laterality: Left;  excision cyst debridment ip joint of left thumb   PROSTATECTOMY     right below knee amputation  2018   ROUX-EN-Y GASTRIC BYPASS  10/10/2010   laparoscopic   SHOULDER ARTHROSCOPY WITH ROTATOR CUFF REPAIR AND SUBACROMIAL DECOMPRESSION Right 03/23/2020   Procedure: RIGHT SHOULDER ARTHROSCOPY WITH DEBRIDEMENT AND  ROTATOR CUFF REPAIR;  Surgeon: Poli Lonni CINDERELLA, MD;  [facility];  Service: Orthopedics;  Laterality: Right;   TOE AMPUTATION  02/23/2006   left foot second ray amputation   TOTAL HIP ARTHROPLASTY Right 03/29/2020   Procedure: TOTAL HIP ARTHROPLASTY POSTERIOR APPROACH;  Surgeon: Jerri Kay HERO, MD;  [facility];  Service: Orthopedics;  Laterality: Right;   TRANSMETATARSAL AMPUTATION Left    TRIGGER FINGER RELEASE Right 09/16/2019   Procedure: RELEASE TRIGGER FINGER/A-1 PULLEY;  Surgeon: Curia Arley, MD;  Location: [facility];  Service: Orthopedics;  Laterality: Right;  IV REGIONAL FOREARM BLOCK    MEDICATIONS:  apixaban  (ELIQUIS ) 5 MG TABS tablet   atorvastatin  (LIPITOR) 40 MG tablet   Calcium  Citrate-Vitamin D  (CALCIUM  CITRATE CHEWY BITE PO)   clopidogrel  (PLAVIX ) 75 MG tablet   CRANBERRY PO   Cyanocobalamin  (VITAMIN B-12 PO)   famotidine  (PEPCID ) 40 MG tablet   Ferrous Sulfate  (IRON PO)   fluticasone  (FLONASE ) 50 MCG/ACT nasal  spray   gabapentin  (NEURONTIN ) 100 MG capsule   hypromellose (SYSTANE NIGHT) 0.3 % GEL ophthalmic ointment   ipratropium (ATROVENT) 0.03 % nasal spray   levothyroxine  (SYNTHROID ) 125 MCG tablet   losartan  (COZAAR ) 25 MG tablet   MAGNESIUM -POTASSIUM PO   metFORMIN  (GLUCOPHAGE -XR) 500 MG 24 hr tablet   Multiple Vitamin (MULTIVITAMIN WITH MINERALS) TABS tablet   Multiple Vitamins-Minerals (PRESERVISION AREDS 2) CAPS   nitroGLYCERIN  (NITROSTAT ) 0.4 MG SL tablet   Omega 3 1000 MG CAPS   Polyethyl Glyc-Propyl Glyc PF (SYSTANE HYDRATION PF) 0.4-0.3 % SOLN   Polyethylene Glycol 400 (BLINK TEARS OP)   Probiotic  Product (PROBIOTIC PO)   Valerian Root 450 MG CAPS   No current facility-administered medications for this encounter.   Burnard CHRISTELLA Odis DEVONNA [facility] Pre-Surgical Testing [facility] Phone [phone] 02/29/2024 7:32 PM         "

## 2024-03-07 ENCOUNTER — Encounter (HOSPITAL_COMMUNITY): Payer: Self-pay | Admitting: Medical

## 2024-03-07 ENCOUNTER — Encounter (HOSPITAL_COMMUNITY): Admission: RE | Payer: Self-pay | Source: Ambulatory Visit

## 2024-03-07 ENCOUNTER — Inpatient Hospital Stay (HOSPITAL_COMMUNITY): Admission: RE | Admit: 2024-03-07 | Admitting: Surgery

## 2024-03-07 SURGERY — COLECTOMY, PARTIAL, ROBOT-ASSISTED, LAPAROSCOPIC
Anesthesia: General | Laterality: Right
